# Patient Record
Sex: Female | Born: 1935 | Race: Black or African American | Hispanic: No | Smoking: Never smoker
Health system: Southern US, Community
[De-identification: ages and names within clinical notes are randomized; demographics above are authoritative.]

## PROBLEM LIST (undated history)

## (undated) DIAGNOSIS — E785 Hyperlipidemia, unspecified: Secondary | ICD-10-CM

## (undated) DIAGNOSIS — I509 Heart failure, unspecified: Secondary | ICD-10-CM

## (undated) DIAGNOSIS — Z950 Presence of cardiac pacemaker: Secondary | ICD-10-CM

## (undated) DIAGNOSIS — I442 Atrioventricular block, complete: Secondary | ICD-10-CM

## (undated) DIAGNOSIS — E1129 Type 2 diabetes mellitus with other diabetic kidney complication: Secondary | ICD-10-CM

## (undated) DIAGNOSIS — J45909 Unspecified asthma, uncomplicated: Secondary | ICD-10-CM

## (undated) DIAGNOSIS — I5042 Chronic combined systolic (congestive) and diastolic (congestive) heart failure: Secondary | ICD-10-CM

## (undated) DIAGNOSIS — I739 Peripheral vascular disease, unspecified: Secondary | ICD-10-CM

## (undated) DIAGNOSIS — E669 Obesity, unspecified: Secondary | ICD-10-CM

## (undated) DIAGNOSIS — I10 Essential (primary) hypertension: Secondary | ICD-10-CM

## (undated) DIAGNOSIS — N039 Chronic nephritic syndrome with unspecified morphologic changes: Secondary | ICD-10-CM

## (undated) DIAGNOSIS — I2510 Atherosclerotic heart disease of native coronary artery without angina pectoris: Secondary | ICD-10-CM

## (undated) DIAGNOSIS — I639 Cerebral infarction, unspecified: Secondary | ICD-10-CM

## (undated) DIAGNOSIS — I252 Old myocardial infarction: Secondary | ICD-10-CM

## (undated) DIAGNOSIS — Z9289 Personal history of other medical treatment: Secondary | ICD-10-CM

## (undated) DIAGNOSIS — N185 Chronic kidney disease, stage 5: Secondary | ICD-10-CM

## (undated) DIAGNOSIS — I6529 Occlusion and stenosis of unspecified carotid artery: Secondary | ICD-10-CM

## (undated) DIAGNOSIS — D631 Anemia in chronic kidney disease: Secondary | ICD-10-CM

## (undated) HISTORY — DX: Atherosclerotic heart disease of native coronary artery without angina pectoris: I25.10

## (undated) HISTORY — DX: Occlusion and stenosis of unspecified carotid artery: I65.29

## (undated) HISTORY — PX: INSERT / REPLACE / REMOVE PACEMAKER: SUR710

## (undated) HISTORY — DX: Peripheral vascular disease, unspecified: I73.9

## (undated) HISTORY — DX: Hyperlipidemia, unspecified: E78.5

## (undated) HISTORY — DX: Personal history of other medical treatment: Z92.89

---

## 1993-12-31 HISTORY — PX: CARDIAC CATHETERIZATION: SHX172

## 1998-12-31 HISTORY — PX: OTHER SURGICAL HISTORY: SHX169

## 2001-04-17 ENCOUNTER — Ambulatory Visit (HOSPITAL_COMMUNITY): Admission: RE | Admit: 2001-04-17 | Discharge: 2001-04-17 | Payer: Self-pay | Admitting: Gastroenterology

## 2004-07-25 ENCOUNTER — Ambulatory Visit (HOSPITAL_COMMUNITY): Admission: RE | Admit: 2004-07-25 | Discharge: 2004-07-26 | Payer: Self-pay | Admitting: Ophthalmology

## 2004-12-06 ENCOUNTER — Ambulatory Visit (HOSPITAL_COMMUNITY): Admission: RE | Admit: 2004-12-06 | Discharge: 2004-12-06 | Payer: Self-pay | Admitting: Interventional Cardiology

## 2005-05-01 ENCOUNTER — Encounter (HOSPITAL_COMMUNITY): Admission: RE | Admit: 2005-05-01 | Discharge: 2005-07-30 | Payer: Self-pay | Admitting: Nephrology

## 2005-05-01 ENCOUNTER — Encounter: Admission: RE | Admit: 2005-05-01 | Discharge: 2005-05-01 | Payer: Self-pay | Admitting: Nephrology

## 2005-05-03 ENCOUNTER — Inpatient Hospital Stay (HOSPITAL_COMMUNITY): Admission: EM | Admit: 2005-05-03 | Discharge: 2005-05-05 | Payer: Self-pay | Admitting: Emergency Medicine

## 2005-05-03 ENCOUNTER — Encounter (INDEPENDENT_AMBULATORY_CARE_PROVIDER_SITE_OTHER): Payer: Self-pay | Admitting: Cardiology

## 2005-10-05 ENCOUNTER — Encounter (HOSPITAL_COMMUNITY): Admission: RE | Admit: 2005-10-05 | Discharge: 2006-01-03 | Payer: Self-pay | Admitting: Nephrology

## 2006-01-04 ENCOUNTER — Encounter (HOSPITAL_COMMUNITY): Admission: RE | Admit: 2006-01-04 | Discharge: 2006-04-04 | Payer: Self-pay

## 2006-05-23 ENCOUNTER — Encounter (HOSPITAL_COMMUNITY): Admission: RE | Admit: 2006-05-23 | Discharge: 2006-08-21 | Payer: Self-pay | Admitting: Nephrology

## 2006-09-13 ENCOUNTER — Encounter (HOSPITAL_COMMUNITY): Admission: RE | Admit: 2006-09-13 | Discharge: 2006-12-12 | Payer: Self-pay | Admitting: Orthopedic Surgery

## 2006-12-19 ENCOUNTER — Encounter (HOSPITAL_COMMUNITY): Admission: RE | Admit: 2006-12-19 | Discharge: 2007-03-11 | Payer: Self-pay | Admitting: Nephrology

## 2007-05-02 ENCOUNTER — Encounter (HOSPITAL_COMMUNITY): Admission: RE | Admit: 2007-05-02 | Discharge: 2007-06-30 | Payer: Self-pay | Admitting: Nephrology

## 2007-08-08 ENCOUNTER — Encounter (HOSPITAL_COMMUNITY): Admission: RE | Admit: 2007-08-08 | Discharge: 2007-10-06 | Payer: Self-pay | Admitting: Nephrology

## 2007-09-16 ENCOUNTER — Encounter: Payer: Self-pay | Admitting: Emergency Medicine

## 2007-11-03 ENCOUNTER — Encounter: Payer: Self-pay | Admitting: Emergency Medicine

## 2007-11-21 ENCOUNTER — Encounter (HOSPITAL_COMMUNITY): Admission: RE | Admit: 2007-11-21 | Discharge: 2008-02-19 | Payer: Self-pay | Admitting: Nephrology

## 2007-12-23 ENCOUNTER — Encounter: Payer: Self-pay | Admitting: Emergency Medicine

## 2007-12-30 ENCOUNTER — Encounter: Payer: Self-pay | Admitting: Emergency Medicine

## 2008-01-02 ENCOUNTER — Ambulatory Visit (HOSPITAL_COMMUNITY): Admission: RE | Admit: 2008-01-02 | Discharge: 2008-01-02 | Payer: Self-pay | Admitting: Nephrology

## 2008-01-12 ENCOUNTER — Ambulatory Visit: Payer: Self-pay | Admitting: Emergency Medicine

## 2008-01-12 DIAGNOSIS — E785 Hyperlipidemia, unspecified: Secondary | ICD-10-CM | POA: Insufficient documentation

## 2008-01-12 DIAGNOSIS — Z9861 Coronary angioplasty status: Secondary | ICD-10-CM

## 2008-01-12 DIAGNOSIS — I2510 Atherosclerotic heart disease of native coronary artery without angina pectoris: Secondary | ICD-10-CM

## 2008-01-13 DIAGNOSIS — I119 Hypertensive heart disease without heart failure: Secondary | ICD-10-CM

## 2008-01-28 ENCOUNTER — Encounter: Payer: Self-pay | Admitting: Emergency Medicine

## 2008-01-28 DIAGNOSIS — J45909 Unspecified asthma, uncomplicated: Secondary | ICD-10-CM

## 2008-01-28 HISTORY — DX: Unspecified asthma, uncomplicated: J45.909

## 2008-02-20 ENCOUNTER — Encounter (HOSPITAL_COMMUNITY): Admission: RE | Admit: 2008-02-20 | Discharge: 2008-05-20 | Payer: Self-pay | Admitting: Nephrology

## 2008-05-21 ENCOUNTER — Encounter (HOSPITAL_COMMUNITY): Admission: RE | Admit: 2008-05-21 | Discharge: 2008-08-19 | Payer: Self-pay | Admitting: Nephrology

## 2008-08-25 ENCOUNTER — Ambulatory Visit: Payer: Self-pay | Admitting: Hematology and Oncology

## 2008-09-03 LAB — CBC WITH DIFFERENTIAL/PLATELET
Basophils Absolute: 0 10*3/uL (ref 0.0–0.1)
HCT: 34.3 % — ABNORMAL LOW (ref 34.8–46.6)
LYMPH%: 28.2 % (ref 14.0–48.0)
MCH: 30.9 pg (ref 26.0–34.0)
MCHC: 33 g/dL (ref 32.0–36.0)
Platelets: 146 10*3/uL (ref 145–400)
RBC: 3.67 10*6/uL — ABNORMAL LOW (ref 3.70–5.32)
RDW: 13.2 % (ref 11.3–14.5)

## 2008-09-10 LAB — LACTATE DEHYDROGENASE: LDH: 275 U/L — ABNORMAL HIGH (ref 94–250)

## 2008-09-10 LAB — COMPREHENSIVE METABOLIC PANEL WITH GFR
ALT: 21 U/L (ref 0–35)
AST: 21 U/L (ref 0–37)
Albumin: 4.1 g/dL (ref 3.5–5.2)
Alkaline Phosphatase: 83 U/L (ref 39–117)
BUN: 32 mg/dL — ABNORMAL HIGH (ref 6–23)
CO2: 25 meq/L (ref 19–32)
Calcium: 9.5 mg/dL (ref 8.4–10.5)
Chloride: 107 meq/L (ref 96–112)
Creatinine, Ser: 1.88 mg/dL — ABNORMAL HIGH (ref 0.40–1.20)
Glucose, Bld: 121 mg/dL — ABNORMAL HIGH (ref 70–99)
Potassium: 5 meq/L (ref 3.5–5.3)
Sodium: 141 meq/L (ref 135–145)
Total Bilirubin: 0.5 mg/dL (ref 0.3–1.2)
Total Protein: 6.9 g/dL (ref 6.0–8.3)

## 2008-09-10 LAB — HEMOCHROMATOSIS DNA-PCR(C282Y,H63D)

## 2008-09-10 LAB — IRON AND TIBC
%SAT: 62 % — ABNORMAL HIGH (ref 20–55)
Iron: 157 ug/dL — ABNORMAL HIGH (ref 42–145)
TIBC: 255 ug/dL (ref 250–470)
UIBC: 98 ug/dL

## 2009-07-26 ENCOUNTER — Encounter (HOSPITAL_COMMUNITY): Admission: RE | Admit: 2009-07-26 | Discharge: 2009-10-24 | Payer: Self-pay | Admitting: Nephrology

## 2009-11-04 ENCOUNTER — Encounter (HOSPITAL_COMMUNITY): Admission: RE | Admit: 2009-11-04 | Discharge: 2010-02-02 | Payer: Self-pay | Admitting: Nephrology

## 2010-02-17 ENCOUNTER — Encounter (HOSPITAL_COMMUNITY): Admission: RE | Admit: 2010-02-17 | Discharge: 2010-05-18 | Payer: Self-pay | Admitting: Nephrology

## 2010-05-30 ENCOUNTER — Encounter (HOSPITAL_COMMUNITY): Admission: RE | Admit: 2010-05-30 | Discharge: 2010-06-29 | Payer: Self-pay | Admitting: Nephrology

## 2010-07-25 ENCOUNTER — Encounter (HOSPITAL_COMMUNITY): Admission: RE | Admit: 2010-07-25 | Discharge: 2010-10-23 | Payer: Self-pay | Admitting: Nephrology

## 2010-11-14 ENCOUNTER — Encounter (HOSPITAL_COMMUNITY)
Admission: RE | Admit: 2010-11-14 | Discharge: 2011-01-30 | Payer: Self-pay | Source: Home / Self Care | Attending: Nephrology | Admitting: Nephrology

## 2011-01-21 ENCOUNTER — Encounter: Payer: Self-pay | Admitting: Hematology and Oncology

## 2011-02-06 ENCOUNTER — Encounter (HOSPITAL_COMMUNITY): Payer: Medicare Other | Attending: Nephrology

## 2011-02-06 DIAGNOSIS — N183 Chronic kidney disease, stage 3 unspecified: Secondary | ICD-10-CM | POA: Insufficient documentation

## 2011-02-06 DIAGNOSIS — D638 Anemia in other chronic diseases classified elsewhere: Secondary | ICD-10-CM | POA: Insufficient documentation

## 2011-03-02 ENCOUNTER — Encounter (HOSPITAL_COMMUNITY): Payer: BC Managed Care – PPO

## 2011-03-06 ENCOUNTER — Encounter (HOSPITAL_COMMUNITY): Payer: Medicare Other

## 2011-04-02 ENCOUNTER — Encounter (HOSPITAL_COMMUNITY)
Admission: RE | Admit: 2011-04-02 | Discharge: 2011-04-02 | Disposition: A | Discharge status: Home / Self Care | Payer: Medicare Other | Source: Ambulatory Visit | Attending: Nephrology | Admitting: Nephrology

## 2011-04-02 DIAGNOSIS — N183 Chronic kidney disease, stage 3 unspecified: Secondary | ICD-10-CM | POA: Insufficient documentation

## 2011-04-02 DIAGNOSIS — D638 Anemia in other chronic diseases classified elsewhere: Secondary | ICD-10-CM | POA: Insufficient documentation

## 2011-04-03 ENCOUNTER — Encounter (HOSPITAL_COMMUNITY): Payer: BC Managed Care – PPO

## 2011-05-01 ENCOUNTER — Encounter (HOSPITAL_COMMUNITY): Payer: Medicare Other | Attending: Nephrology

## 2011-05-01 DIAGNOSIS — D638 Anemia in other chronic diseases classified elsewhere: Secondary | ICD-10-CM | POA: Insufficient documentation

## 2011-05-01 DIAGNOSIS — N183 Chronic kidney disease, stage 3 unspecified: Secondary | ICD-10-CM | POA: Insufficient documentation

## 2011-05-18 NOTE — Cardiovascular Report (Signed)
NAMELEONORE, FRANKSON NO.:  [id number]   MEDICAL RECORD NO.:  [id number]          PATIENT TYPE:  OIB   LOCATION:  2899                         FACILITY:  [facility]   PHYSICIAN:  Lyn Records III, M.D.DATE OF BIRTH:  [birth year]   DATE OF PROCEDURE:  12/06/2004  DATE OF DISCHARGE:  12/06/2004                              CARDIAC CATHETERIZATION   INDICATIONS FOR PROCEDURE:  Abnormal Cardiolite, prior history of myocardial  infarction, prior history of diagonal angioplasty after myocardial  infarction.  Cardiolite demonstrates a large area of anterolateral and  lateral wall ischemia, no apical ischemia.   PROCEDURES PERFORMED:  1.  Left heart catheterization.  2.  Coronary angiography.  3.  Left ventriculography.  4.  Angio-Seal arteriotomy closure.  5.  Intravenous labetalol.  6.  Intracoronary nitroglycerin.   DESCRIPTION OF PROCEDURE:  After informed consent, a 6-French sheath was  placed using the modified Seldinger technique.  A 6-French A-2 multipurpose  catheter was used for hemodynamic recordings, left ventriculography by hand  injection, and selective right and left coronary angiography.  Following the  procedure, a sheathogram was performed on the right femoral and Angio-Seal  arteriotomy closure performed without complications.  Intravenous labetalol  20 mg was administered prior to contrast injection because of elevated blood  pressure.  Intracoronary nitroglycerin 200 mcg was administered during left  coronary angiography.   RESULTS:  1.  Hemodynamic data.      1.  Aortic pressure 153/60.      2.  Left ventricular pressure 153/12.  2.  Left ventriculography: The left ventricle was normal in size.  There was      mild mid inferior wall hypokinesis.  Overall, EF was 60%, no MR.  3.  Coronary angiography.      1.  Left main coronary: Left main coronary artery for the most part is          widely patent.  Minimal calcification is noted.  No  obstruction is          seen.      2.  Left anterior descending coronary: The LAD is a large vessel that          wraps around the left ventricular apex and supplies a portion of the          inferior wall.  The LAD for the most part is widely patent through          the proximal half of the vessel.  There is a proximal 50% narrowing          just after the first septal perforator and just distal to the origin          of the first diagonal.  The first diagonal was a previous infarct          vessel and contains 80% ostial narrowing as well as 95% narrowing in          the mid vessel inside a previous angioplasty.  In the mid LAD, there          are 50-60% lesions and  in the distal LAD at the apex, there was 90%          focal stenosis.      3.  Circumflex artery: The circumflex coronary artery is a dominant          vessel that gives origin to the PDA.  It also gives origin to a          large second obtuse marginal.  The first obtuse marginal is totally          occluded in the proximal vessel.  It fills late by left to left          collaterals.  The collaterals to this obtuse marginal are from the          second obtuse marginal and the PDA.      4.  Right coronary artery: The right coronary artery is nondominant.          There is calcium in the ostium and it bifurcates into the AV groove          continuation of the nondominant right coronary and a conus branch          both limbs of which are 75% narrowed.  There is no inferior wall          branch that arises from this small vessel.  4.  Angio-Seal arteriotomy closure: Successful with good hemostasis and no      complications.   CONCLUSION:  1.  Significant coronary atherosclerosis with 99% mid diagonal #1, 80%      ostial diagonal #1, and total occlusion of the first obtuse marginal.      The first obtuse marginal fills by left to left collaterals from the      distal circumflex.  The LAD contains moderate proximal mid disease  and      severe 90% plus stenosis at the apex.  LAD is dominant and wraps the      apex.  The right coronary artery was nondominant and also contains      significant obstruction.  2.  Overall normal LV function with inferior wall hypokinesis.   PLAN:  Aggressive risk factor modification and given the patient's renal  function and the location of the obstruction in the first diagonal in the  absence of anginal pain, I believe continued medical therapy is the  treatment of choice.       HWS/MEDQ  D:  12/06/2004  T:  12/07/2004  Job:  [id number]

## 2011-05-18 NOTE — Discharge Summary (Signed)
NAMESWEET, Dorothy Bartlett                ACCOUNT NO.:  [id number]   MEDICAL RECORD NO.:  [id number]          PATIENT TYPE:  INP   LOCATION:  3731                         FACILITY:  [facility]   PHYSICIAN:  Elliot Cousin, M.D.    DATE OF BIRTH:  1926/08/13   DATE OF ADMISSION:  05/03/2005  DATE OF DISCHARGE:  05/05/2005                                 DISCHARGE SUMMARY   DISCHARGE DIAGNOSES:  1.  Hyperkalemia secondary to chronic kidney disease and ACE inhibitor and      ARB.  2.  Altered mental status thought to be secondary to hyperkalemia and      possibly hypoglycemia.  3.  Stage IV chronic kidney disease with secondary hypoparathyroidism.      Baseline BUN appears to range between 40 and 45,  and baseline      creatinine appears to range between 1.9 and 2.2.  4.  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  5.  Right carotid artery stenosis.  6.  Left ventricular hypertrophy and mild aortic stenosis per 2-D      echocardiogram of May4,2006.  7.  Coronary artery disease status post myocardial infarction and      angioplasties in the past.  8.  Chronic normocytic anemia thought to be secondary to chronic kidney      disease.  9.  Hyperlipidemia.  10. Colon polyps, status post polypectomy approximately three years ago.  11. Diabetic retinopathy, status post photocoagulation and lens implant in      left eye in July of2005.   DISCHARGE MEDICATIONS:  1.  Do not take enalapril.  2.  Diovan 160 mg 1 pill daily, restart on Monday, EAV4,0981.  3.  Zyrtec 10 mg daily.  4.  Glipizide 10 mg1 pill daily (do not take if your blood sugar is less      than 100).  5.  Actos 30 mg daily.  6.  Lasix 40 mg daily.  7.  Lopressor 50 mg b.i.d.  8.  Zemplar 1 mcg daily.  9.  Vytorin 10/80 1 pill daily.  10. Aspirin 81 mg daily.  11. Nephro-Vite tablet 2 daily.  12. Ferrous sulfate 325 mg 1 tablet twice daily.   DISCHARGE DISPOSITION:  The patient was discharged home in improved and  stable condition on May6,2006. The patient will follow up with Dr. Hyman Hopes on  [id number] for blood work. The patient was advised to follow up with Dr.  Ivory Broad in 7-10 days.   CONSULTATIONS:  Dr. Briant Cedar, nephrologist.   PROCEDURES PERFORMED:  1.  Bilateral carotid Doppler study on May4,2006. The results revealed on      the right, 60-80 mid-range ICA stenosis. ECA stenosis. On the left, no      evidence of significant ICA stenosis. The bilateral vertebral flow is      antegrade. Technically difficult study secondary to body habitus.  2.  A 2-D echocardiogram on May4, 2006 as read by Dr. Donnie Aho.  The results      revealed left ventricular ejection fraction estimated at 55-65%. No left      ventricular  regional wall motion abnormalities. Left ventricular wall      thickness was moderately increased. Aortic valve thickness was      moderately increased. Findings were consistent with very mild aortic      valve stenosis. The mean transaortic valve gradient was 9 mmHg.      Estimated aortic valve area was 2.04 cm squared. Estimated aortic valve      area by Z-Max was 2.1 cm squared. Left atrium was moderately dilated.   HISTORY OF PRESENT ILLNESS:  The patient is a 77 year old lady with a past  medical history significant for coronary artery disease, type 2 diabetes  mellitus, and known right carotid artery stenosis, who presented to the  emergency department on [id number],6644 with the chief complaint of altered level  of consciousness. Apparently the patient had been sleeping on her couch.  When she woke up, she could not move. She was apparently diaphoretic and  could not speak. The patient also had difficulty seeing, and her speech was  apparently garbled. When the patient arrived to the emergency department,  her potassium was found to be elevated at 6.9. She was treated with calcium  chloride, sodium bicarbonate, and Kayexalate. The patient was admitted for  further evaluation and  management.   For additional details please see the dictated history and physical.   HOSPITAL COURSE:  Problem 1.  HYPERKALEMIA/ALTERED MENTAL STATUS.  As stated  above, the patient was started on treatment in the emergency department for  hyperkalemia. The patient was subsequently admitted to a telemetry bed and  started on treatment with Kayexalate and half normal saline. The patient's  serum potassium was followed closely during the hospital course. A CT scan  of the head was obtained in the emergency department and was negative for  any acute intracranial abnormalities. There was evidence of chronic  microvascular ischemic change. Following several more doses of Kayexalate,  the patient's serum potassium improved to 5.5. A decision was made to add  bicarbonate to the IV fluids. Following treatment with bicarbonate, the  patient's potassium improved to 4.9. Following the treatment, during the  first few hours of hospitalization, the patient's mental status improved  tremendously. It was felt that the patient may have had a TIA on admission  and, therefore, carotid Dopplers were ordered to rule out stenosis. The  carotid Doppler study did reveal a 60-80% mid-range ICA stenosis. The  patient states that she has known about the stenosis for at least one year  now. The patient has seen a vascular surgeon in [facility], and he has  decided to treat the patient medically with aspirin. Given that the  patient's symptoms resolved with correction of the hyperkalemia,  it was  felt that the patient's altered level of consciousness was secondary to the  hyperkalemia and not the right carotid artery stenosis. For additional  evaluation, a 2-D echocardiogram was ordered. The 2-D echocardiogram  revealed that the patient had preserved left ventricular systolic function.  Her ejection fraction ranged between 55 and 65%. She did have evidence of very mild aortic stenosis. The very mild stenosis was  felt to be a non-  contributing factor to the patient's presenting symptoms.   Dr. Briant Cedar, nephrologist, was consulted given the patient's chronic  kidney disease. Dr. Briant Cedar agreed with the medical management which  included Kayexalate, bicarbonate in the IV fluids, and holding the Diovan  and enalapril. He did recommend restarting the Diovan in three days for  blood pressure control with  close followup by Dr. Hyman Hopes in one week. The  enalapril will be on hold for now. The patient's capillary blood sugars were  relatively hypoglycemic at home per the patient and the patient's daughter.  Her capillary blood sugars were in the 60s during the day of the patient's  presenting symptoms. It is not clear if the patient also had a hypoglycemic  episode while at home. The patient's capillary blood sugar on admission in  the emergency department was 93. During the three-day hospital course, the  patient's mental status was back to baseline. On exam, the patient had no  focal neurological deficits. She was ambulating in the room and in the  hallway with no abnormalities. The patient will follow up with Dr. Hyman Hopes on  [id number].  At that time, a repeat basic metabolic panel will be assessed  particularly to recheck the patient's serum potassium.   Problem 2.  STAGE IV CHRONIC KIDNEY DISEASE.  The patient is followed by a  nephrologist, Garnetta Buddy, M.D., for outpatient management of her chronic  kidney disease. The patient was recently started on Zemplar for secondary  hypoparathyroidism. The patient was also recently started on Nephro-Vite and  Aranesp. It appears that her baseline BUN ranges between 40 and 45, and her  baseline creatinine appears to range between 1.9 and 2.2.   Problem 3.  TYPE 2 DIABETES MELLITUS WITH A HISTORY OF RETINOPATHY AND  POSSIBLE HYPOGLYCEMIA.  The patient's glipizide was decreased to 10 mg  daily, and the Actos was continued at 30 mg daily. The patient's  capillary  blood sugars were assessed before each meal and at bedtime. During the  hospital course, the patient's capillary blood sugars ranged between 75 and  150. However, on the day of hospital discharge, her capillary blood sugar  did increase to 399. The patient was given 7 units of NovoLog x1.  The  patient was advised to continue 10 mg of glipizide daily but to hold  glipizide if her capillary blood sugars were less than 100. If she finds  that her capillary blood sugars are significantly increased, then she can  restart glipizide 10 mg b.i.d.. The patient was advised to continue Actos as  previously prescribed.   Problem 4.  NORMOCYTIC ANEMIA.  The patient's hemoglobin on admission was  9.6 with a MCV of 92.7. During the hospital course, the patient's hemoglobin  fluctuated between 9.6 and 8.6. The patient was started on Aranesp by Dr.  Hyman Hopes several weeks ago. She was taking an over-the-counter iron supplement as well. However, during the hospital course, she was started on ferrous  sulfate 325 mg twice daily. Iron studies ordered during the hospital course  revealed a total iron of 69, TIBC of 292, percent saturation of 24, vitamin  B12 of 483, and folic acid of greater than 20. Further management and follow  up per Dr. Hyman Hopes.   DISCHARGE LABORATORY DATA:  WBC 9.7, hemoglobin 9, hematocrit 27.1,  platelets 237,000.  Sodium 142, potassium 4.7, chloride 108, CO2 26, glucose  66, BUN 40, creatinine 2, albumin 3.2, calcium 8.7, phosphorus 3.8.      DF/MEDQ  D:  05/05/2005  T:  05/06/2005  Job:  [id number]   cc:   Garnetta Buddy, M.D.  [address]  [place]  [place] [address]  Fax: [phone]   Lyn Records III, M.D.  [address]  [address]  [place]  [place] [address]  Fax: [phone]   Reuben Likes, M.D.  [place], [place]

## 2011-05-18 NOTE — H&P (Signed)
NAME:  Dorothy Bartlett, Dorothy Bartlett                ACCOUNT NO.:  [id number]   MEDICAL RECORD NO.:  [id number]          PATIENT TYPE:  EMS   LOCATION:  MAJO                         FACILITY:  [facility]   PHYSICIAN:  Jonna L. Robb Matar, M.D.DATE OF BIRTH:  1926/07/15   DATE OF ADMISSION:  05/03/2005  DATE OF DISCHARGE:                                HISTORY & PHYSICAL   PRIMARY CARE PHYSICIAN:  Unassigned, so Dr. Nolon Nations in [place].   NEPHROLOGIST:  Garnetta Buddy, M.D.   CARDIOLOGIST:  Lyn Records, M.D.   CHIEF COMPLAINT:  Fall and weakness.   HISTORY:  This 77 year old, African-American female was sleeping on the  couch.  When she went to wake up, she could not move.  She was diaphoretic.  She could not speak.  She could not see.  She had difficulty talking in her  garbled speech.  She fell off the couch and eventually managed to get to the  phone and call her family.  They estimate it was about 20 minutes after they  got there that she finally seemed to come around to herself.  She was  brought to the emergency room, where she was found to have a potassium of  6.9, was treated with calcium chloride, sodium bicarbonate, and one dose of  Kayexalate at about 5:00 a.m.   PAST MEDICAL HISTORY:  1.  Type 2 diabetes.  She has had this for about 10 years.  Sugars have been      pretty well controlled.  She did not think that her episode was a low      blood sugar reaction.  2.  Coronary artery disease.  She has had one MI, two angioplasties.  Her      most recent catheterization was this past December, where she was still      found to have only one vessel totally occluded, and marginal was 95%,      but they felt at that time that medical management was going to be the      treatment.  3.  Hypertension.  4.  She had pneumonia at one time.  5.  Diabetic retinopathy, and had photocoagulation and lens implant in her      left eye in July 2005.  6.  Chronic renal disease.  It was only  diagnosed officially two weeks ago.      She saw Dr. Hyman Hopes and got started on Aranesp, Zemplar, and Rena-Vite.      However, Dr. Katrinka Blazing mentioned that he thought she was having some      problems with her kidneys.  This may have actually been going on for      about a year or two.  7.  Hyperlipidemia.  8.  Colon polyps.  This was diagnosed two years ago.  She had them removed.   ALLERGIES:  Big doses of ASPIRIN give her dyspepsia, but she can tolerate  the 81 mg size.   MEDICATIONS:  1.  Diovan 160.  2.  Enalapril 30.  3.  Zyrtec 10.  4.  Glipizide 10 b.i.d.  5.  Actos 30.  6.  Lasix 40 daily.  7.  Lopressor 50 b.i.d.  8.  Zemplar 1 mcg daily.  9.  Rena-Vite.  10. Feosol.  11. Vytorin 10/80 daily.  12. Aspirin 81.   OPERATIONS:  1.  Left cataract.  2.  Left arm nerve surgery.   SOCIAL HISTORY:  Nonsmoker, nondrinker.  No drugs.  She is widowed.  Has  three daughters, two sons.  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   FAMILY HISTORY:  Hypertension, diabetes, MI.   REVIEW OF SYSTEMS:  The patient denies any fever or chills or excessive  weight gain.  She does have a nonproductive cough for the last month.  No  wheezing or history of COPD.  She has not been told of heart failure,  although she does have problems with fluid.  No history of blood clots.  No  nausea or vomiting, diarrhea, constipation, hematemesis, melena, or liver  problems.  No hematuria or renal calculi.  She does not have problems with  continence.  The patient has nocturia x 1.  No breast masses.  Her last  mammogram she cannot remember.  We discussed with her daughter having her  mother go for mammography.  No history of thyroid or B-12 problems.  She had  a pruritic rash on her  skin that was causing difficulty but cleared up with  Zyrtec and some creams.  Occasional mild arthritis in her back, but she does  not take anything over the counter for it.   No previous history of seizure,  CVA, TIA.  No anxiety or depression.   PHYSICAL EXAMINATION:  VITAL SIGNS:  Temperature 97.1; pulse 67;  respirations 18; blood pressure 195/67; 96%.  GENERAL:  Well-developed, morbidly obese African-American female, quite  pleasant.  HEENT:  Normal conjunctivae and lids.  Pupils reactive.  Extraocular  movements are full.  Normal hearing, mucosa, pharynx.  NECK:  Supple, without masses or thyromegaly.  She has a left carotid bruit  and possibly a right carotid bruit as well, although it is hard to tell on  the right if it is a transmitted heart sound.  VASCULAR:  I do not hear femoral bruits.  She has no cyanosis or clubbing.  She has 2+ edema to the midcalf.  BREASTS:  Enlarged, without masses, tenderness, or discharge.  ABDOMEN:  Nontender.  Normal bowel sounds.  No hepatosplenomegaly or  hernias.  Normal external genitalia.  LYMPHATIC:  No cervical or inguinal adenopathy.  EXTREMITIES:  Muscle strength is 5/5, with full range of motion in all four  extremities.  SKIN:  There are no skin rashes, lesions, or nodules.  NEUROLOGIC:  Cranial nerves are intact.  DTRs are 1+.  Sensation seems to be  intact.  She is alert and oriented.  Normal memory, judgment, and affect.   LABORATORY DATA:  CT of the head shows chronic microvascular white matter  disease.  Renal ultrasound done two days go just showed chronic renal  medical disease, no hydronephrosis.  EKG shows poor R wave progression and a  peaked T wave.  Urinalysis was negative.  INR 0.9.  BUN and creatinine were  49 and 1.9.  Glucose 93, calcium 9.1.  Normal liver function tests.  Initial  potassium was 6.9, repeat down to 6.1.  White count 10.8, with 80%  neutrophils, hemoglobin 9.6.   IMPRESSION:  1.  Transient ischemic attack.  This may  have been brought on by      hypoglycemia or the potassium issues.  I am going to check an ultrasound     of her carotids and start her on Plavix.  2.   Hyperkalemia.  Most likely, this is due to her worsening renal function,      and both Diovan and Vasotec on board.  Obviously, those will have to be      held.  3.  Chronic renal failure.  She will continue on her Aranesp, Zemplar, Rena-      Vite.  4.  Type 2 diabetes.  This is well controlled, which makes me wonder if she      was actually hypoglycemic, since over time she probably is not excreting      the glipizide and Actos as well.  If she has significant elements of      congestive heart failure on the echocardiogram, we may consider getting      rid of the Actos, since that can contribute to CHF.  5.  Anemia of chronic disease.  I will be checking an anemia workup, and she      has been started on erythropoietin.  6.  Coronary artery disease.  7.  Hypertension.  She is having some problems with that.  We may need to      put her on non-ACE or ARB medications, considering Cardizem or      clonidine.  If she has heart failure, we may consider going for      something like hydralazine and nitroglycerin product.  8.  Hyperlipidemia.  She will continue on her Vytorin.      JLB/MEDQ  D:  05/03/2005  T:  05/03/2005  Job:  [id number]

## 2011-05-18 NOTE — Op Note (Signed)
NAME:  Dorothy Bartlett, DIVELEY                          ACCOUNT NO.:  [id number]   MEDICAL RECORD NO.:  [id number]                   PATIENT TYPE:  OIB   LOCATION:  2899                                 FACILITY:  [facility]   PHYSICIAN:  Beulah Gandy. Ashley Royalty, M.D.              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   PROCEDURES:  1. Panretinal photocoagulation left eye.  2. Placement of secondary intraocular lens left eye.  3. Intraocular lens suture left eye.  4. Pars plana vitrectomy left eye.  5. Iridectomy left eye.   SURGEON:  Beulah Gandy. Ashley Royalty, M.D.   ASSISTANT:  Lawson Fiscal _______, RN.   ANESTHESIA:  General anesthesia.   DESCRIPTION OF PROCEDURE:  Usual prep and drape.  The indirect  ophthalmoscope laser was moved into place, 1072 burns were placed around  the retinal periphery with a power of 200 milliwatts, 1000 microns each and  0.1 seconds each.  A full panretinal photocoagulation was performed sparing  the macular region.  The attention was then carried to the limbus where a  peritomy was performed from 8 o'clock to 4 o'clock.  A 4 mm infusion port  anchored into place at 4 o'clock.  The lighted pick and the cutter were  placed at 10 and 2 o'clock.  Pars plana vitrectomy was begun using the BIOM  viewing system.  The posterior vitreous was carefully removed and trimmed  down to the vitreous base.  An iridectomy was performed to remove adhesion  from the iris to the capsule at 10 o'clock.  The cornea was opened with a  three-layer wound.  Two 10-0 Prolene sutures were passed from 3 o'clock to 9  o'clock beneath raised scleral flaps in anticipation of the intraocular lens  placement.  The sutures were externalized through the corneal wound,  attached to an intraocular lens implant.  The implant was model [id number],  power  22.5D, links 12.5 mm, optic 7.0 mm, serial number [id number], made by  [employer].  The implant was placed on the operative field,  inspected and cleaned.  Sutures were attached to the optics.  The implant  was placed in the posterior sulcus and dialed into place.  The sutures were  drawn securely, knotted and the free ends removed beneath the scleral flaps.  Additional vitrectomy was performed at this point. The cornea wound was  closed with four interrupted 10-0 nylon sutures.  The sclerotomy points were  closed at 2 o'clock and 4 o'clock.  The conjunctiva was closed with 7-0  chromic suture, polymixin and gentamicin were irrigated in the tenon space,  atropine solution was applied.  Decadron 10 mg was injected in the lower  subconjunctival space.  Marcaine was injected around the globe for  postoperative  pain.  Polysporin, a patch and shield were placed, closing  tension was 10 mmHg.   COMPLICATIONS:  None.   DURATION:  One hour.                                               Beulah Gandy. Ashley Royalty, M.D.    JDM/MEDQ  D:  07/25/2004  T:  07/25/2004  Job:  [id number]

## 2011-05-29 ENCOUNTER — Encounter (HOSPITAL_COMMUNITY): Payer: Medicare Other

## 2011-06-05 ENCOUNTER — Encounter (HOSPITAL_COMMUNITY): Payer: BC Managed Care – PPO

## 2011-06-26 ENCOUNTER — Encounter (HOSPITAL_COMMUNITY): Payer: BC Managed Care – PPO

## 2011-07-24 ENCOUNTER — Encounter (HOSPITAL_COMMUNITY): Payer: BC Managed Care – PPO

## 2011-08-09 ENCOUNTER — Encounter (HOSPITAL_COMMUNITY): Payer: BC Managed Care – PPO | Attending: Nephrology

## 2011-08-09 DIAGNOSIS — D638 Anemia in other chronic diseases classified elsewhere: Secondary | ICD-10-CM | POA: Insufficient documentation

## 2011-08-09 DIAGNOSIS — N183 Chronic kidney disease, stage 3 unspecified: Secondary | ICD-10-CM | POA: Insufficient documentation

## 2011-09-21 LAB — POCT HEMOGLOBIN-HEMACUE: Hemoglobin: 11.3 — ABNORMAL LOW

## 2011-09-24 LAB — POCT HEMOGLOBIN-HEMACUE
Hemoglobin: 11.9 — ABNORMAL LOW
Operator id: 206361

## 2011-10-05 ENCOUNTER — Encounter (HOSPITAL_COMMUNITY)
Admission: RE | Admit: 2011-10-05 | Discharge: 2011-10-05 | Disposition: A | Discharge status: Home / Self Care | Payer: Medicare Other | Source: Ambulatory Visit | Attending: Nephrology | Admitting: Nephrology

## 2011-10-05 DIAGNOSIS — D638 Anemia in other chronic diseases classified elsewhere: Secondary | ICD-10-CM | POA: Insufficient documentation

## 2011-10-05 DIAGNOSIS — N183 Chronic kidney disease, stage 3 unspecified: Secondary | ICD-10-CM | POA: Insufficient documentation

## 2011-10-15 LAB — POCT I-STAT 4, (NA,K, GLUC, HGB,HCT)
HCT: 48 — ABNORMAL HIGH
Operator id: 274481

## 2011-11-02 ENCOUNTER — Encounter (HOSPITAL_COMMUNITY): Payer: Medicare Other

## 2011-11-02 DIAGNOSIS — D638 Anemia in other chronic diseases classified elsewhere: Secondary | ICD-10-CM | POA: Insufficient documentation

## 2011-11-02 DIAGNOSIS — N183 Chronic kidney disease, stage 3 unspecified: Secondary | ICD-10-CM | POA: Insufficient documentation

## 2011-11-12 ENCOUNTER — Other Ambulatory Visit (HOSPITAL_COMMUNITY): Payer: Self-pay | Admitting: *Deleted

## 2011-11-13 ENCOUNTER — Encounter (HOSPITAL_COMMUNITY)
Admission: RE | Admit: 2011-11-13 | Discharge: 2011-11-13 | Disposition: A | Discharge status: Home / Self Care | Payer: Medicare Other | Source: Ambulatory Visit | Attending: Nephrology | Admitting: Nephrology

## 2011-11-13 MED ORDER — DARBEPOETIN ALFA-POLYSORBATE 500 MCG/ML IJ SOLN
40.0000 ug | INTRAMUSCULAR | Status: DC
  Filled 2011-11-13: qty 1

## 2011-11-13 MED ORDER — DARBEPOETIN ALFA-POLYSORBATE 40 MCG/0.4ML IJ SOLN
INTRAMUSCULAR | Status: AC
  Administered 2011-11-13: 40 ug via SUBCUTANEOUS
  Filled 2011-11-13: qty 0.4

## 2011-12-07 ENCOUNTER — Encounter (HOSPITAL_COMMUNITY)
Admission: RE | Admit: 2011-12-07 | Discharge: 2011-12-07 | Disposition: A | Discharge status: Home / Self Care | Payer: Medicare Other | Source: Ambulatory Visit | Attending: Nephrology | Admitting: Nephrology

## 2011-12-07 DIAGNOSIS — D638 Anemia in other chronic diseases classified elsewhere: Secondary | ICD-10-CM | POA: Insufficient documentation

## 2011-12-07 DIAGNOSIS — N183 Chronic kidney disease, stage 3 unspecified: Secondary | ICD-10-CM | POA: Insufficient documentation

## 2011-12-07 MED ORDER — DARBEPOETIN ALFA-POLYSORBATE 500 MCG/ML IJ SOLN
40.0000 ug | INTRAMUSCULAR | Status: DC
  Filled 2011-12-07: qty 1

## 2011-12-07 MED ORDER — DARBEPOETIN ALFA-POLYSORBATE 40 MCG/0.4ML IJ SOLN
INTRAMUSCULAR | Status: AC
  Administered 2011-12-07: 40 ug via SUBCUTANEOUS
  Filled 2011-12-07: qty 0.4

## 2011-12-07 MED ORDER — CLONIDINE HCL 0.1 MG PO TABS
0.1000 mg | ORAL_TABLET | Freq: Once | ORAL | Status: AC | PRN
  Administered 2011-12-07: 0.1 mg via ORAL

## 2011-12-07 MED ORDER — CLONIDINE HCL 0.1 MG PO TABS
ORAL_TABLET | ORAL | Status: AC
  Administered 2011-12-07: 0.1 mg via ORAL
  Filled 2011-12-07: qty 1

## 2012-01-04 ENCOUNTER — Inpatient Hospital Stay (HOSPITAL_COMMUNITY): Admission: RE | Admit: 2012-01-04 | Payer: BC Managed Care – PPO | Source: Ambulatory Visit

## 2012-01-29 ENCOUNTER — Other Ambulatory Visit (HOSPITAL_COMMUNITY): Payer: Self-pay | Admitting: *Deleted

## 2012-02-01 ENCOUNTER — Encounter (HOSPITAL_COMMUNITY): Payer: BC Managed Care – PPO

## 2012-02-06 ENCOUNTER — Encounter (HOSPITAL_COMMUNITY)
Admission: RE | Admit: 2012-02-06 | Discharge: 2012-02-06 | Disposition: A | Discharge status: Home / Self Care | Payer: Medicare Other | Source: Ambulatory Visit | Attending: Nephrology | Admitting: Nephrology

## 2012-02-06 DIAGNOSIS — N183 Chronic kidney disease, stage 3 unspecified: Secondary | ICD-10-CM | POA: Insufficient documentation

## 2012-02-06 DIAGNOSIS — D638 Anemia in other chronic diseases classified elsewhere: Secondary | ICD-10-CM | POA: Insufficient documentation

## 2012-02-06 MED ORDER — DARBEPOETIN ALFA-POLYSORBATE 40 MCG/0.4ML IJ SOLN
INTRAMUSCULAR | Status: AC
  Filled 2012-02-06: qty 0.4

## 2012-02-06 MED ORDER — DARBEPOETIN ALFA-POLYSORBATE 500 MCG/ML IJ SOLN
40.0000 ug | INTRAMUSCULAR | Status: DC
  Administered 2012-02-06: 40 ug via SUBCUTANEOUS

## 2012-02-06 MED FILL — Darbepoetin Alfa-Polysorbate 80 Soln Inj 40 MCG/0.4ML: INTRAMUSCULAR | Qty: 0.4 | Status: AC

## 2012-03-07 ENCOUNTER — Encounter (HOSPITAL_COMMUNITY): Payer: BC Managed Care – PPO

## 2012-04-04 ENCOUNTER — Other Ambulatory Visit (HOSPITAL_COMMUNITY): Payer: Self-pay | Admitting: *Deleted

## 2012-04-07 ENCOUNTER — Encounter (HOSPITAL_COMMUNITY)
Admission: RE | Admit: 2012-04-07 | Discharge: 2012-04-07 | Disposition: A | Discharge status: Home / Self Care | Payer: Medicare Other | Source: Ambulatory Visit | Attending: Nephrology | Admitting: Nephrology

## 2012-04-07 DIAGNOSIS — N183 Chronic kidney disease, stage 3 unspecified: Secondary | ICD-10-CM | POA: Insufficient documentation

## 2012-04-07 DIAGNOSIS — D638 Anemia in other chronic diseases classified elsewhere: Secondary | ICD-10-CM | POA: Insufficient documentation

## 2012-04-07 MED ORDER — DARBEPOETIN ALFA-POLYSORBATE 40 MCG/0.4ML IJ SOLN
INTRAMUSCULAR | Status: AC
  Administered 2012-04-07: 40 ug via SUBCUTANEOUS
  Filled 2012-04-07: qty 0.4

## 2012-04-07 MED ORDER — DARBEPOETIN ALFA-POLYSORBATE 500 MCG/ML IJ SOLN
40.0000 ug | INTRAMUSCULAR | Status: DC
  Filled 2012-04-07: qty 1

## 2012-05-06 ENCOUNTER — Encounter (HOSPITAL_COMMUNITY): Payer: BC Managed Care – PPO

## 2012-06-02 ENCOUNTER — Other Ambulatory Visit (HOSPITAL_COMMUNITY): Payer: Self-pay | Admitting: *Deleted

## 2012-06-03 ENCOUNTER — Encounter (HOSPITAL_COMMUNITY): Payer: BC Managed Care – PPO

## 2012-06-13 ENCOUNTER — Encounter (HOSPITAL_COMMUNITY): Payer: BC Managed Care – PPO

## 2012-06-13 ENCOUNTER — Encounter (HOSPITAL_COMMUNITY)
Admission: RE | Admit: 2012-06-13 | Discharge: 2012-06-13 | Disposition: A | Discharge status: Home / Self Care | Payer: BC Managed Care – PPO | Source: Ambulatory Visit | Attending: Nephrology | Admitting: Nephrology

## 2012-06-13 DIAGNOSIS — D638 Anemia in other chronic diseases classified elsewhere: Secondary | ICD-10-CM | POA: Insufficient documentation

## 2012-06-13 DIAGNOSIS — N183 Chronic kidney disease, stage 3 unspecified: Secondary | ICD-10-CM | POA: Insufficient documentation

## 2012-07-11 ENCOUNTER — Encounter (HOSPITAL_COMMUNITY)
Admission: RE | Admit: 2012-07-11 | Discharge: 2012-07-11 | Disposition: A | Discharge status: Home / Self Care | Payer: BC Managed Care – PPO | Source: Ambulatory Visit | Attending: Nephrology | Admitting: Nephrology

## 2012-07-11 DIAGNOSIS — N183 Chronic kidney disease, stage 3 unspecified: Secondary | ICD-10-CM | POA: Insufficient documentation

## 2012-07-11 DIAGNOSIS — D638 Anemia in other chronic diseases classified elsewhere: Secondary | ICD-10-CM | POA: Insufficient documentation

## 2012-07-11 MED ORDER — DARBEPOETIN ALFA-POLYSORBATE 500 MCG/ML IJ SOLN: 40.0000 ug | INTRAMUSCULAR | Status: DC

## 2012-08-08 ENCOUNTER — Encounter (HOSPITAL_COMMUNITY)
Admission: RE | Admit: 2012-08-08 | Discharge: 2012-08-08 | Disposition: A | Discharge status: Home / Self Care | Payer: Medicare Other | Source: Ambulatory Visit | Attending: Nephrology | Admitting: Nephrology

## 2012-08-08 DIAGNOSIS — D638 Anemia in other chronic diseases classified elsewhere: Secondary | ICD-10-CM | POA: Insufficient documentation

## 2012-08-08 DIAGNOSIS — N183 Chronic kidney disease, stage 3 unspecified: Secondary | ICD-10-CM | POA: Insufficient documentation

## 2012-08-08 MED ORDER — DARBEPOETIN ALFA-POLYSORBATE 40 MCG/0.4ML IJ SOLN
INTRAMUSCULAR | Status: AC
  Administered 2012-08-08: 40 ug via SUBCUTANEOUS
  Filled 2012-08-08: qty 0.4

## 2012-08-08 MED ORDER — DARBEPOETIN ALFA-POLYSORBATE 500 MCG/ML IJ SOLN: 40.0000 ug | INTRAMUSCULAR | Status: DC

## 2012-09-04 ENCOUNTER — Other Ambulatory Visit (HOSPITAL_COMMUNITY): Payer: Self-pay | Admitting: *Deleted

## 2012-09-05 ENCOUNTER — Encounter (HOSPITAL_COMMUNITY)
Admission: RE | Admit: 2012-09-05 | Discharge: 2012-09-05 | Disposition: A | Discharge status: Home / Self Care | Payer: Medicare Other | Source: Ambulatory Visit | Attending: Nephrology | Admitting: Nephrology

## 2012-09-05 DIAGNOSIS — D638 Anemia in other chronic diseases classified elsewhere: Secondary | ICD-10-CM | POA: Insufficient documentation

## 2012-09-05 DIAGNOSIS — N183 Chronic kidney disease, stage 3 unspecified: Secondary | ICD-10-CM | POA: Insufficient documentation

## 2012-09-05 MED ORDER — DARBEPOETIN ALFA-POLYSORBATE 40 MCG/0.4ML IJ SOLN
INTRAMUSCULAR | Status: AC
  Filled 2012-09-05: qty 0.4

## 2012-09-05 MED ORDER — DARBEPOETIN ALFA-POLYSORBATE 40 MCG/0.4ML IJ SOLN: 40.0000 ug | INTRAMUSCULAR | Status: DC

## 2012-09-05 NOTE — Progress Notes (Signed)
Pt BP too high to givwe injection today.  Took several Bp measurements and called and spoke with Dr Hyman Hopes. Let him know she did have an order for clonidine but that the pt did not want to take it because she had taken her regular blood pressure medicines this morning and the last time she took something extra it made her feel bad.   He requested that she come back next week.  Pt rescheduled for next week.

## 2012-09-12 ENCOUNTER — Encounter (HOSPITAL_COMMUNITY): Payer: BC Managed Care – PPO

## 2012-09-15 ENCOUNTER — Encounter (HOSPITAL_COMMUNITY)
Admission: RE | Admit: 2012-09-15 | Discharge: 2012-09-15 | Disposition: A | Discharge status: Home / Self Care | Payer: Medicare Other | Source: Ambulatory Visit | Attending: Nephrology | Admitting: Nephrology

## 2012-09-15 MED ORDER — DARBEPOETIN ALFA-POLYSORBATE 40 MCG/0.4ML IJ SOLN
INTRAMUSCULAR | Status: AC
  Filled 2012-09-15: qty 0.4

## 2012-09-15 MED ORDER — DARBEPOETIN ALFA-POLYSORBATE 500 MCG/ML IJ SOLN
40.0000 ug | INTRAMUSCULAR | Status: DC
  Filled 2012-09-15: qty 1

## 2012-09-15 NOTE — Progress Notes (Signed)
Patient's BP elevated on this visit.  Patient declined taking PO Clonidine.  Called Dr. Hyman Hopes who stated to hold Aranesp injection and to reschedule patient in two weeks.

## 2012-09-26 ENCOUNTER — Encounter (HOSPITAL_COMMUNITY): Payer: BC Managed Care – PPO

## 2012-10-02 ENCOUNTER — Encounter (HOSPITAL_COMMUNITY)
Admission: RE | Admit: 2012-10-02 | Discharge: 2012-10-02 | Disposition: A | Discharge status: Home / Self Care | Payer: Medicare Other | Source: Ambulatory Visit | Attending: Nephrology | Admitting: Nephrology

## 2012-10-02 DIAGNOSIS — N183 Chronic kidney disease, stage 3 unspecified: Secondary | ICD-10-CM | POA: Insufficient documentation

## 2012-10-02 DIAGNOSIS — D638 Anemia in other chronic diseases classified elsewhere: Secondary | ICD-10-CM | POA: Insufficient documentation

## 2012-10-02 MED ORDER — DARBEPOETIN ALFA-POLYSORBATE 500 MCG/ML IJ SOLN
40.0000 ug | INTRAMUSCULAR | Status: DC
  Filled 2012-10-02: qty 1

## 2012-10-02 MED ORDER — DARBEPOETIN ALFA-POLYSORBATE 40 MCG/0.4ML IJ SOLN
INTRAMUSCULAR | Status: AC
  Administered 2012-10-02: 40 ug via SUBCUTANEOUS
  Filled 2012-10-02: qty 0.4

## 2012-10-03 ENCOUNTER — Encounter (HOSPITAL_COMMUNITY): Payer: BC Managed Care – PPO

## 2012-10-31 ENCOUNTER — Encounter (HOSPITAL_COMMUNITY): Payer: BC Managed Care – PPO

## 2012-12-03 ENCOUNTER — Other Ambulatory Visit (HOSPITAL_COMMUNITY): Payer: Self-pay | Admitting: *Deleted

## 2012-12-04 ENCOUNTER — Encounter (HOSPITAL_COMMUNITY): Payer: BC Managed Care – PPO

## 2012-12-15 ENCOUNTER — Encounter (HOSPITAL_COMMUNITY): Payer: BC Managed Care – PPO

## 2013-01-16 ENCOUNTER — Encounter (HOSPITAL_COMMUNITY): Payer: BC Managed Care – PPO

## 2013-02-02 ENCOUNTER — Encounter (HOSPITAL_COMMUNITY): Payer: BC Managed Care – PPO

## 2013-03-06 ENCOUNTER — Encounter (HOSPITAL_COMMUNITY): Payer: BC Managed Care – PPO

## 2013-03-25 ENCOUNTER — Other Ambulatory Visit (HOSPITAL_COMMUNITY): Payer: Self-pay | Admitting: *Deleted

## 2013-03-26 ENCOUNTER — Encounter (HOSPITAL_COMMUNITY)
Admission: RE | Admit: 2013-03-26 | Discharge: 2013-03-26 | Disposition: A | Discharge status: Home / Self Care | Payer: Medicare Other | Source: Ambulatory Visit | Attending: Nephrology | Admitting: Nephrology

## 2013-03-31 ENCOUNTER — Encounter (HOSPITAL_COMMUNITY): Payer: BC Managed Care – PPO

## 2013-04-23 ENCOUNTER — Encounter (HOSPITAL_COMMUNITY)
Admission: RE | Admit: 2013-04-23 | Discharge: 2013-04-23 | Disposition: A | Discharge status: Home / Self Care | Payer: Medicare Other | Source: Ambulatory Visit | Attending: Nephrology | Admitting: Nephrology

## 2013-05-20 ENCOUNTER — Other Ambulatory Visit (HOSPITAL_COMMUNITY): Payer: Self-pay | Admitting: *Deleted

## 2013-05-21 ENCOUNTER — Encounter (HOSPITAL_COMMUNITY): Payer: BC Managed Care – PPO

## 2013-06-06 DIAGNOSIS — J45909 Unspecified asthma, uncomplicated: Secondary | ICD-10-CM | POA: Insufficient documentation

## 2013-06-17 ENCOUNTER — Other Ambulatory Visit (HOSPITAL_COMMUNITY): Payer: Self-pay

## 2013-06-18 ENCOUNTER — Encounter (HOSPITAL_COMMUNITY): Payer: BC Managed Care – PPO

## 2013-08-07 ENCOUNTER — Encounter (HOSPITAL_COMMUNITY)
Admission: RE | Admit: 2013-08-07 | Discharge: 2013-08-07 | Disposition: A | Discharge status: Home / Self Care | Payer: Medicare Other | Source: Ambulatory Visit | Attending: Nephrology | Admitting: Nephrology

## 2013-08-07 DIAGNOSIS — D638 Anemia in other chronic diseases classified elsewhere: Secondary | ICD-10-CM | POA: Insufficient documentation

## 2013-08-07 DIAGNOSIS — N183 Chronic kidney disease, stage 3 unspecified: Secondary | ICD-10-CM | POA: Insufficient documentation

## 2013-08-07 MED ORDER — DARBEPOETIN ALFA-POLYSORBATE 40 MCG/0.4ML IJ SOLN
INTRAMUSCULAR | Status: AC
  Administered 2013-08-07: 40 ug via SUBCUTANEOUS
  Filled 2013-08-07: qty 0.4

## 2013-08-07 MED ORDER — CLONIDINE HCL 0.1 MG PO TABS
ORAL_TABLET | ORAL | Status: AC
  Administered 2013-08-07: 0.1 mg
  Filled 2013-08-07: qty 1

## 2013-08-07 MED ORDER — DARBEPOETIN ALFA-POLYSORBATE 500 MCG/ML IJ SOLN
40.0000 ug | INTRAMUSCULAR | Status: DC
  Filled 2013-08-07: qty 1

## 2013-08-07 NOTE — Progress Notes (Signed)
BP remains 184/67 30 minutes after Clonidine0.1 mg given; Spoke with [place] states to proceed with giving Aranesp 40 mcg.

## 2013-09-02 ENCOUNTER — Other Ambulatory Visit (HOSPITAL_COMMUNITY): Payer: Self-pay | Admitting: *Deleted

## 2013-09-03 ENCOUNTER — Encounter (HOSPITAL_COMMUNITY): Payer: BC Managed Care – PPO

## 2013-10-01 ENCOUNTER — Encounter (HOSPITAL_COMMUNITY): Payer: BC Managed Care – PPO

## 2013-10-07 ENCOUNTER — Encounter (INDEPENDENT_AMBULATORY_CARE_PROVIDER_SITE_OTHER): Payer: Self-pay

## 2013-10-07 ENCOUNTER — Ambulatory Visit (HOSPITAL_COMMUNITY): Payer: Medicare Other | Attending: Cardiology

## 2013-10-07 DIAGNOSIS — I10 Essential (primary) hypertension: Secondary | ICD-10-CM | POA: Insufficient documentation

## 2013-10-07 DIAGNOSIS — J449 Chronic obstructive pulmonary disease, unspecified: Secondary | ICD-10-CM | POA: Insufficient documentation

## 2013-10-07 DIAGNOSIS — I2510 Atherosclerotic heart disease of native coronary artery without angina pectoris: Secondary | ICD-10-CM | POA: Insufficient documentation

## 2013-10-07 DIAGNOSIS — I658 Occlusion and stenosis of other precerebral arteries: Secondary | ICD-10-CM | POA: Insufficient documentation

## 2013-10-07 DIAGNOSIS — I6529 Occlusion and stenosis of unspecified carotid artery: Secondary | ICD-10-CM | POA: Insufficient documentation

## 2013-10-07 DIAGNOSIS — E785 Hyperlipidemia, unspecified: Secondary | ICD-10-CM | POA: Insufficient documentation

## 2013-10-07 DIAGNOSIS — E119 Type 2 diabetes mellitus without complications: Secondary | ICD-10-CM | POA: Insufficient documentation

## 2013-10-07 DIAGNOSIS — J4489 Other specified chronic obstructive pulmonary disease: Secondary | ICD-10-CM | POA: Insufficient documentation

## 2013-10-12 ENCOUNTER — Telehealth: Payer: Self-pay

## 2013-10-12 NOTE — Telephone Encounter (Signed)
Message copied by Jarvis Newcomer on Mon Oct 12, 2013 10:22 AM ------      Message from: Verdis Prime      Created: Sun Oct 11, 2013  4:39 PM       Less than 60% RICA and <40% LICA stenosis. Stable findings and needs f/u 1 year. ------

## 2013-11-11 ENCOUNTER — Encounter (HOSPITAL_COMMUNITY): Payer: Medicare Other

## 2013-12-16 DIAGNOSIS — R609 Edema, unspecified: Secondary | ICD-10-CM | POA: Insufficient documentation

## 2013-12-30 ENCOUNTER — Encounter (HOSPITAL_COMMUNITY): Payer: Self-pay | Admitting: Emergency Medicine

## 2013-12-30 ENCOUNTER — Ambulatory Visit (INDEPENDENT_AMBULATORY_CARE_PROVIDER_SITE_OTHER): Payer: 59 | Admitting: Podiatry

## 2013-12-30 ENCOUNTER — Emergency Department (HOSPITAL_COMMUNITY)
Admission: EM | Admit: 2013-12-30 | Discharge: 2013-12-30 | Disposition: A | Discharge status: Home / Self Care | Payer: Medicare Other | Attending: Emergency Medicine | Admitting: Emergency Medicine

## 2013-12-30 ENCOUNTER — Encounter: Payer: Self-pay | Admitting: Podiatry

## 2013-12-30 VITALS — BP 200/89 | HR 63 | Ht <= 58 in | Wt 203.0 lb

## 2013-12-30 DIAGNOSIS — E119 Type 2 diabetes mellitus without complications: Secondary | ICD-10-CM | POA: Insufficient documentation

## 2013-12-30 DIAGNOSIS — R609 Edema, unspecified: Secondary | ICD-10-CM

## 2013-12-30 DIAGNOSIS — M79609 Pain in unspecified limb: Secondary | ICD-10-CM | POA: Insufficient documentation

## 2013-12-30 DIAGNOSIS — I252 Old myocardial infarction: Secondary | ICD-10-CM | POA: Insufficient documentation

## 2013-12-30 DIAGNOSIS — Z792 Long term (current) use of antibiotics: Secondary | ICD-10-CM | POA: Insufficient documentation

## 2013-12-30 DIAGNOSIS — I10 Essential (primary) hypertension: Secondary | ICD-10-CM | POA: Insufficient documentation

## 2013-12-30 DIAGNOSIS — M79672 Pain in left foot: Secondary | ICD-10-CM

## 2013-12-30 DIAGNOSIS — I739 Peripheral vascular disease, unspecified: Secondary | ICD-10-CM

## 2013-12-30 DIAGNOSIS — R600 Localized edema: Secondary | ICD-10-CM

## 2013-12-30 DIAGNOSIS — Z79899 Other long term (current) drug therapy: Secondary | ICD-10-CM | POA: Insufficient documentation

## 2013-12-30 DIAGNOSIS — M7989 Other specified soft tissue disorders: Secondary | ICD-10-CM | POA: Insufficient documentation

## 2013-12-30 DIAGNOSIS — Z95818 Presence of other cardiac implants and grafts: Secondary | ICD-10-CM | POA: Insufficient documentation

## 2013-12-30 DIAGNOSIS — G8929 Other chronic pain: Secondary | ICD-10-CM

## 2013-12-30 DIAGNOSIS — J45909 Unspecified asthma, uncomplicated: Secondary | ICD-10-CM | POA: Insufficient documentation

## 2013-12-30 DIAGNOSIS — Z87448 Personal history of other diseases of urinary system: Secondary | ICD-10-CM | POA: Insufficient documentation

## 2013-12-30 HISTORY — DX: Essential (primary) hypertension: I10

## 2013-12-30 MED ORDER — TRAMADOL HCL 50 MG PO TABS: 50.0000 mg | ORAL_TABLET | Freq: Four times a day (QID) | ORAL | Status: DC | PRN

## 2013-12-30 NOTE — ED Notes (Signed)
Bed: WA19 Expected date:  Expected time:  Means of arrival:  Comments: Hold for Triage 1. 

## 2013-12-30 NOTE — Patient Instructions (Signed)
Seen for pain on left foot. Need vascular attention on left lower limb. Patient will check into ER to have vascular consultation done.

## 2013-12-30 NOTE — Progress Notes (Signed)
79 year old female presents accompanied by her daughter with pain on both feet, left foot is very bad. Pain is all the time. Been hurting for the past 2 months. Stated that she was seen her PCP and was put on antibiotics. Now she is taking 2nd round of antibiotics.   Objective: Vascular: Pedal pulses are not palpable. Cold, purple red left foot.  Edema left foot.  Severe pain with light pressure on left foot. Mild discomfort on right.  Dermatologic: No open lesions. Neurologic: All epicritic sensations grossly intact with hyperalgic foot left. Orthopedic: Rectus foot.  Assessment: PVD with ischemic foot pain left.  Plan: Vascular consultation. Patient's daughter will take her to ER since she cannot get an appointment with Vascular doctor till next week.

## 2013-12-30 NOTE — ED Notes (Signed)
Pt c/o L foot pain and swelling x "a couple months."  Pain score 10/10.  Pt was seen at a Podiatrists office this morning and sent here for a vascular consultation.  Swelling noted.  Cap refill < 3 sec.

## 2013-12-30 NOTE — ED Provider Notes (Signed)
CSN: [id number]     Arrival date & time 12/30/13  1243 History   None    Chief Complaint  Patient presents with  . Foot Swelling  . Foot Pain   (Consider location/radiation/quality/duration/timing/severity/associated sxs/prior Treatment) Patient is a 79 y.o. female presenting with lower extremity pain.  Foot Pain Pertinent negatives include no abdominal pain, chest pain, chills, congestion, coughing, fever, joint swelling, myalgias, nausea, numbness, rash, sore throat, vomiting or weakness.   79 yo female presents with 2 month hx of gradual onset LEFT foot pain with associating swelling and redness. Patient states she has some pain in Right foot as well but not as much as LEFT. Pain is described as achey, burning/tingling that is constant without radiation. Pain localized to LEFT forefoot with worsening pain at distal toes. Patient denies any recent illness, infection, or trauma. No recent travel, surgery, or hx of cancer. Patient states seen by family doctor for same issue. Seen by podiatry today and told it may be vascular in nature. Patient been tried on Keflex and currently on Clindamycin and lotrimin foot spray without any improvement in pain or swelling. Patient states also tested negative for gout (blood uric acid), though she has no hx of gout. Patient denies any claudication pain. PMH significant for Type 2 DM, Renal disorder, HTN, and prior MI with cardiac cath.  Past Medical History  Diagnosis Date  . Diabetes mellitus without complication   . Hypertension   . Renal disorder   . MI (myocardial infarction)   . Asthma    Past Surgical History  Procedure Laterality Date  . Cardiac catheterization     History reviewed. No pertinent family history. History  Substance Use Topics  . Smoking status: Never Smoker   . Smokeless tobacco: Never Used  . Alcohol Use: No   OB History   Grav Para Term Preterm Abortions TAB SAB Ect Mult Living                 Review of Systems   Constitutional: Negative for fever and chills.  HENT: Negative for congestion, sinus pressure and sore throat.   Respiratory: Negative for cough and shortness of breath.   Cardiovascular: Negative for chest pain and palpitations.  Gastrointestinal: Negative for nausea, vomiting, abdominal pain, diarrhea and constipation.  Musculoskeletal: Negative for back pain, gait problem, joint swelling and myalgias.  Skin: Negative for rash.  Neurological: Negative for weakness and numbness.    Allergies  Review of patient's allergies indicates no known allergies.  Home Medications   Current Outpatient Rx  Name  Route  Sig  Dispense  Refill  . amLODipine (NORVASC) 10 MG tablet   Oral   Take 10 mg by mouth daily.         . benazepril (LOTENSIN) 10 MG tablet   Oral   Take 10 mg by mouth daily.          . clindamycin (CLEOCIN) 300 MG capsule   Oral   Take 300 mg by mouth 3 (three) times daily.          . fluconazole (DIFLUCAN) 150 MG tablet               . folic acid-vitamin b complex-vitamin c-selenium-zinc (DIALYVITE) 3 MG TABS tablet   Oral   Take 1 tablet by mouth daily.         . furosemide (LASIX) 80 MG tablet   Oral   Take 40 mg by mouth daily.         [name]  JANUVIA 25 MG tablet   Oral   Take 25 mg by mouth daily.          . metoprolol (LOPRESSOR) 50 MG tablet   Oral   Take 50 mg by mouth 2 (two) times daily.          . paricalcitol (ZEMPLAR) 1 MCG capsule   Oral   Take 1 mcg by mouth daily.          . traMADol (ULTRAM) 50 MG tablet   Oral   Take 1 tablet (50 mg total) by mouth every 6 (six) hours as needed.   15 tablet   0    BP 170/71  Pulse 72  Temp(Src) 98.3 F (36.8 C) (Oral)  Resp 22  SpO2 98% Physical Exam  Nursing note and vitals reviewed. Constitutional: She is oriented to person, place, and time. She appears well-developed and well-nourished. No distress.  HENT:  Head: Normocephalic and atraumatic.  Eyes: Conjunctivae and EOM  are normal.  Neck: Normal range of motion. Neck supple. No JVD present.  Cardiovascular: Normal rate and regular rhythm.  Exam reveals no gallop and no friction rub.   No murmur heard. Pulses:      Dorsalis pedis pulses are 1+ on the right side, and 0 on the left side.       Posterior tibial pulses are 1+ on the right side, and 1+ on the left side.  Doppler US of DP pulse undetected on LEFT and Biphasic on RIGHT. PT pulse present bilaterally and noted to be Biphasic.   ABI done by me in room: RIGHT  0.651; LEFT 0.639  Pulmonary/Chest: Effort normal and breath sounds normal. No respiratory distress. She has no wheezes. She has no rales.  Musculoskeletal: Normal range of motion. She exhibits edema (LEFT foot).  Dorsal swelling and erythema of LEFT foot with tenderness to palpation of distal toes.   No swelling or erythema of RIGHT foot noted.   No signs of ulceration, fissure or injury to feet bilaterally.   Mild hypertrophic toe nails bilaterally, worse on LEFT.      Neurological: She is alert and oriented to person, place, and time.  Skin: Skin is warm and dry. She is not diaphoretic.  Psychiatric: She has a normal mood and affect. Her behavior is normal.    ED Course  Procedures (including critical care time) Labs Review Labs Reviewed - No data to display Imaging Review No results found.  EKG Interpretation   None       MDM   1. Edema of left foot   2. Foot pain, left     Well's Score is -1. No signs of infection. No hx of MRSA infection. LEFT foot minimally cooler to touch than RIGHT foot. Suspect unilateral foot swelling and pain related to arterial insufficiency.   Discussed  exam findings with patient. Advised follow up with Vascular Surgery as soon as possible.  Recommend return to ED should symptoms worsen, increase in pain, paleness or becomes cool to touch of affected foot. Patient agrees with plan. Discharged in good condition.   Meds given in  ED:  Medications - No data to display  Discharge Medication List as of 12/30/2013  4:34 PM    START taking these medications   Details  traMADol (ULTRAM) 50 MG tablet Take 1 tablet (50 mg total) by mouth every 6 (six) hours as needed., Starting 12/30/2013, Until Discontinued, Print          Allen Norris  Duke Salvia, PA-C 12/31/13 [phone]

## 2013-12-30 NOTE — ED Provider Notes (Signed)
  Face-to-face evaluation   History: Gradually worse left foot pain with swelling and pain, not responsive to oral ABX.   Physical exam: Obese, elderly in mild pain. Left foot is red on forefoot with hyperemic toes. Moderate pain with mvt. of toes. Likely intertriginous fungal infx. No palpable pulse in left foot; 1-2+ PT  pulse with doppler. Left foot is NTTP. ABI done; both 0.6.    Discussed with Vascular Surgeon, will see in office.  Medical screening examination/treatment/procedure(s) were conducted as a shared visit with non-physician practitioner(s) and myself.  I personally evaluated the patient during the encounter  Flint Melter, MD 12/31/13 2310

## 2013-12-30 NOTE — Progress Notes (Signed)
   CARE MANAGEMENT ED NOTE 12/30/2013  Patient:  Dorothy Bartlett, Dorothy Bartlett   Account Number:  [id number]  Date Initiated:  12/30/2013  Documentation initiated by:  Edd Arbour  Subjective/Objective Assessment:   79 yr old female [employer] medicare complete pt,no pcp listed in EPIC pt states pcp is roy moreira     Subjective/Objective Assessment Detail:     Action/Plan:   EPIC updated   Action/Plan Detail:   Anticipated DC Date:       Status Recommendation to Physician:   Result of Recommendation:    Other ED Services  Consult Working Plan    DC Planning Services  Other  PCP issues  Outpatient Services - Pt will follow up    Choice offered to / List presented to:            Status of service:  Completed, signed off  ED Comments:   ED Comments Detail:

## 2014-01-04 ENCOUNTER — Telehealth: Payer: Self-pay | Admitting: Interventional Cardiology

## 2014-01-04 ENCOUNTER — Encounter: Payer: Self-pay | Admitting: Vascular Surgery

## 2014-01-04 NOTE — Telephone Encounter (Signed)
New Prob    Pts daughter has some questions regarding possible stents needing to be placed for pt. Please call.

## 2014-01-04 NOTE — Telephone Encounter (Signed)
returned pt call. pt daughter sts that pt was complaining of left foot pain and [facility]. pt daughter sts that pt is to f/u with a vascular surgeon Dr.James Hart RochesterLawson. pt will keep upcoming appt with him on 01/05/14

## 2014-01-05 ENCOUNTER — Ambulatory Visit (INDEPENDENT_AMBULATORY_CARE_PROVIDER_SITE_OTHER): Payer: Medicare Other | Admitting: Vascular Surgery

## 2014-01-05 ENCOUNTER — Encounter (HOSPITAL_COMMUNITY): Payer: Medicare Other

## 2014-01-05 ENCOUNTER — Ambulatory Visit (HOSPITAL_COMMUNITY)
Admission: RE | Admit: 2014-01-05 | Discharge: 2014-01-05 | Disposition: A | Discharge status: Home / Self Care | Payer: Medicare Other | Source: Ambulatory Visit | Attending: Vascular Surgery | Admitting: Vascular Surgery

## 2014-01-05 ENCOUNTER — Encounter: Payer: Self-pay | Admitting: Vascular Surgery

## 2014-01-05 ENCOUNTER — Other Ambulatory Visit: Payer: Self-pay | Admitting: Vascular Surgery

## 2014-01-05 ENCOUNTER — Other Ambulatory Visit: Payer: Self-pay | Admitting: *Deleted

## 2014-01-05 ENCOUNTER — Encounter: Payer: Medicare Other | Admitting: Vascular Surgery

## 2014-01-05 ENCOUNTER — Ambulatory Visit (INDEPENDENT_AMBULATORY_CARE_PROVIDER_SITE_OTHER)
Admission: RE | Admit: 2014-01-05 | Discharge: 2014-01-05 | Disposition: A | Discharge status: Home / Self Care | Payer: Medicare Other | Source: Ambulatory Visit

## 2014-01-05 VITALS — BP 187/78 | HR 59 | Resp 16 | Ht <= 58 in | Wt 203.7 lb

## 2014-01-05 DIAGNOSIS — I739 Peripheral vascular disease, unspecified: Secondary | ICD-10-CM

## 2014-01-05 DIAGNOSIS — M7989 Other specified soft tissue disorders: Secondary | ICD-10-CM | POA: Insufficient documentation

## 2014-01-05 DIAGNOSIS — M79609 Pain in unspecified limb: Secondary | ICD-10-CM | POA: Insufficient documentation

## 2014-01-05 DIAGNOSIS — R0989 Other specified symptoms and signs involving the circulatory and respiratory systems: Secondary | ICD-10-CM

## 2014-01-05 NOTE — Progress Notes (Signed)
Patient name: Dorothy Bartlett MRN: [id number] DOB: Apr 07, 1926 Sex: female   Referred by: Ludwig Clarks  Reason for referral:  Chief Complaint  Patient presents with  . New Evaluation    c/o leg pain bilateral leg pain (throbbing, achy) ,  L>R  , swelling bilateral ankles and feet            HISTORY OF PRESENT ILLNESS: The patient is a very pleasant 80 year old female who presents today with bilateral lower extremity pain. This is much worse on the left leg than the right leg. She has other pain and has been seen in the emergency department for treatment. She has been taking Ultram every 6 hours with some relief. She reports pain specifically in her left foot extending up onto her calf. She does walk but has difficulty and pain associated with this as well. She did have a possible fungal infection between the web spaces of her toes on the left but this is cleared with over-the-counter Lotrimin. She does have known renal insufficiency and does have a history of coronary artery disease with prior myocardial infarction and coronary angioplasty and stenting.  Past Medical History  Diagnosis Date  . Diabetes mellitus without complication   . Hypertension   . Renal disorder   . MI (myocardial infarction)   . Asthma     Past Surgical History  Procedure Laterality Date  . Cardiac catheterization    . Repair of nerve in left arm  Left 2000    History   Social History  . Marital Status: Widowed    Spouse Name: N/A    Number of Children: N/A  . Years of Education: N/A   Occupational History  . Not on file.   Social History Main Topics  . Smoking status: Never Smoker   . Smokeless tobacco: Never Used  . Alcohol Use: No  . Drug Use: No  . Sexual Activity: Not on file   Other Topics Concern  . Not on file   Social History Narrative  . No narrative on file    Family History  Problem Relation Age of Onset  . Diabetes Father   . Hypertension Father   . Hypertension Daughter     . Hyperlipidemia Daughter   . Hyperlipidemia Son     Allergies as of 01/05/2014  . (No Known Allergies)    Current Outpatient Prescriptions on File Prior to Visit  Medication Sig Dispense Refill  . amLODipine (NORVASC) 10 MG tablet Take 10 mg by mouth daily.      . clindamycin (CLEOCIN) 300 MG capsule Take 300 mg by mouth 3 (three) times daily.       . folic acid-vitamin b complex-vitamin c-selenium-zinc (DIALYVITE) 3 MG TABS tablet Take 1 tablet by mouth daily.      . furosemide (LASIX) 80 MG tablet Take 40 mg by mouth daily.      [name] JANUVIA 25 MG tablet Take 25 mg by mouth daily.       . metoprolol (LOPRESSOR) 50 MG tablet Take 50 mg by mouth 2 (two) times daily.       . paricalcitol (ZEMPLAR) 1 MCG capsule Take 1 mcg by mouth daily.       . traMADol (ULTRAM) 50 MG tablet Take 1 tablet (50 mg total) by mouth every 6 (six) hours as needed.  15 tablet  0  . benazepril (LOTENSIN) 10 MG tablet Take 10 mg by mouth daily.       [name]  fluconazole (DIFLUCAN) 150 MG tablet        No current facility-administered medications on file prior to visit.     REVIEW OF SYSTEMS:  Positives indicated with an "X"  CARDIOVASCULAR:  [ ]  chest pain   [ ]  chest pressure   [ ]  palpitations   [ ]  orthopnea   [ ]  dyspnea on exertion   [ ]  claudication   [x ] rest pain   [ ]  DVT   [ ]  phlebitis PULMONARY:   [ ]  productive cough   [x ] asthma   [x ] wheezing NEUROLOGIC:   [ ]  weakness  [ ]  paresthesias  [ ]  aphasia  [ ]  amaurosis  [ ]  dizziness HEMATOLOGIC:   [ ]  bleeding problems   [ ]  clotting disorders MUSCULOSKELETAL:  [ ]  joint pain   [ ]  joint swelling GASTROINTESTINAL: [ ]   blood in stool  [ ]   hematemesis GENITOURINARY:  [ ]   dysuria  [ ]   hematuria PSYCHIATRIC:  [ ]  history of major depression INTEGUMENTARY:  [ ]  rashes  [ ]  ulcers CONSTITUTIONAL:  [ ]  fever   [ ]  chills  PHYSICAL EXAMINATION:  General: The patient is a well-nourished female, in no acute distress. Vital signs are BP 187/78   Pulse 59  Resp 16  Ht 4\' 9"  (1.448 m)  Wt 203 lb 11.2 oz (92.398 kg)  BMI 44.07 kg/m2 Pulmonary: There is a good air exchange bilaterally without wheezing or rales. Abdomen: Soft and non-tender with normal pitch bowel sounds. Musculoskeletal: There are no major deformities.  She does have dependent rubor in her left foot. Neurologic: No focal weakness or paresthesias are detected, Skin: There are no ulcer or rashes noted. Psychiatric: The patient has normal affect. Cardiovascular: There is a regular rate and rhythm without significant murmur appreciated. Pulse status: 2+ radial pulses bilaterally 2+ femoral pulses bilaterally she has a palpable right popliteal pulse absent right pedal pulses. On the left there is absent popliteal and pedal pulses Carotid arteries without bruits bilaterally  VVS Vascular Lab Studies:  Ordered and Independently Reviewed monophasic tibial waveforms bilaterally. Ankle arm indices not reliable do to calcific disease. Absent digital waveforms on left and 10 on the right  Impression and Plan:  Bilateral lower extremity arterial insufficiency left greater than right. I do feel that she has severe ischemia in her left foot with limb threatening ischemia. She does have a known renal insufficiency with a baseline creatinine of 1.8. I explained the need for arteriography for further evaluation and possible treatment. She will be admitted to the hospital on 1/8 for IV hydration and arteriogram and possible angioplasty with limited contrast on 1/9. If she has treatment option for angioplasty this will be undertaken at that time. If not we will discuss possible bypass. If she requires surgery we would have her see Dr. Katrinka BlazingSmith for cardiology clearance   Bane Hagy [facility] Office: [phone]

## 2014-01-07 ENCOUNTER — Inpatient Hospital Stay (HOSPITAL_COMMUNITY)
Admission: RE | Admit: 2014-01-07 | Discharge: 2014-01-08 | DRG: 301 | Disposition: A | Discharge status: Home / Self Care | Payer: Medicare Other | Source: Ambulatory Visit | Attending: Vascular Surgery | Admitting: Vascular Surgery

## 2014-01-07 ENCOUNTER — Other Ambulatory Visit: Payer: Self-pay | Admitting: Physician Assistant

## 2014-01-07 DIAGNOSIS — I10 Essential (primary) hypertension: Secondary | ICD-10-CM | POA: Diagnosis present

## 2014-01-07 DIAGNOSIS — E119 Type 2 diabetes mellitus without complications: Secondary | ICD-10-CM | POA: Diagnosis present

## 2014-01-07 DIAGNOSIS — J45909 Unspecified asthma, uncomplicated: Secondary | ICD-10-CM | POA: Diagnosis present

## 2014-01-07 DIAGNOSIS — I743 Embolism and thrombosis of arteries of the lower extremities: Principal | ICD-10-CM | POA: Diagnosis present

## 2014-01-07 DIAGNOSIS — I252 Old myocardial infarction: Secondary | ICD-10-CM

## 2014-01-07 LAB — PROTIME-INR
INR: 0.92 (ref 0.00–1.49)
PROTHROMBIN TIME: 12.2 s (ref 11.6–15.2)

## 2014-01-07 LAB — BASIC METABOLIC PANEL
BUN: 35 mg/dL — ABNORMAL HIGH (ref 6–23)
CHLORIDE: 103 meq/L (ref 96–112)
CO2: 23 mEq/L (ref 19–32)
Calcium: 9.8 mg/dL (ref 8.4–10.5)
Creatinine, Ser: 2.82 mg/dL — ABNORMAL HIGH (ref 0.50–1.10)
GFR calc non Af Amer: 14 mL/min — ABNORMAL LOW (ref >90)
GFR, EST AFRICAN AMERICAN: 17 mL/min — AB (ref >90)
Glucose, Bld: 186 mg/dL — ABNORMAL HIGH (ref 70–99)
POTASSIUM: 4.3 meq/L (ref 3.7–5.3)
Sodium: 141 mEq/L (ref 137–147)

## 2014-01-07 LAB — CBC
HCT: 38.4 % (ref 36.0–46.0)
HEMOGLOBIN: 12.7 g/dL (ref 12.0–15.0)
MCH: 30 pg (ref 26.0–34.0)
MCHC: 33.1 g/dL (ref 30.0–36.0)
MCV: 90.8 fL (ref 78.0–100.0)
Platelets: 273 10*3/uL (ref 150–400)
RBC: 4.23 MIL/uL (ref 3.87–5.11)
RDW: 13.5 % (ref 11.5–15.5)
WBC: 10.3 10*3/uL (ref 4.0–10.5)

## 2014-01-07 MED ORDER — ALUM & MAG HYDROXIDE-SIMETH 200-200-20 MG/5ML PO SUSP: 15.0000 mL | ORAL | Status: DC | PRN

## 2014-01-07 MED ORDER — HYDRALAZINE HCL 20 MG/ML IJ SOLN
10.0000 mg | INTRAMUSCULAR | Status: DC | PRN
  Administered 2014-01-07: 10 mg via INTRAVENOUS
  Filled 2014-01-07: qty 1

## 2014-01-07 MED ORDER — PANTOPRAZOLE SODIUM 40 MG PO TBEC
40.0000 mg | DELAYED_RELEASE_TABLET | Freq: Every day | ORAL | Status: DC
  Administered 2014-01-07: 40 mg via ORAL
  Filled 2014-01-07: qty 1

## 2014-01-07 MED ORDER — POTASSIUM CHLORIDE CRYS ER 20 MEQ PO TBCR
20.0000 meq | EXTENDED_RELEASE_TABLET | Freq: Once | ORAL | Status: DC
  Filled 2014-01-07: qty 2

## 2014-01-07 MED ORDER — SODIUM BICARBONATE BOLUS VIA INFUSION
INTRAVENOUS | Status: DC
  Administered 2014-01-08: 275 meq via INTRAVENOUS
  Filled 2014-01-07: qty 1

## 2014-01-07 MED ORDER — PHENOL 1.4 % MT LIQD
1.0000 | OROMUCOSAL | Status: DC | PRN
  Filled 2014-01-07: qty 177

## 2014-01-07 MED ORDER — GUAIFENESIN-DM 100-10 MG/5ML PO SYRP: 15.0000 mL | ORAL_SOLUTION | ORAL | Status: DC | PRN

## 2014-01-07 MED ORDER — MORPHINE SULFATE 2 MG/ML IJ SOLN: 2.0000 mg | INTRAMUSCULAR | Status: DC | PRN

## 2014-01-07 MED ORDER — LABETALOL HCL 5 MG/ML IV SOLN
10.0000 mg | INTRAVENOUS | Status: DC | PRN
  Filled 2014-01-07: qty 4

## 2014-01-07 MED ORDER — ACETAMINOPHEN 650 MG RE SUPP: 325.0000 mg | RECTAL | Status: DC | PRN

## 2014-01-07 MED ORDER — ONDANSETRON HCL 4 MG/2ML IJ SOLN: 4.0000 mg | Freq: Four times a day (QID) | INTRAMUSCULAR | Status: DC | PRN

## 2014-01-07 MED ORDER — SODIUM BICARBONATE 8.4 % IV SOLN
INTRAVENOUS | Status: AC
  Administered 2014-01-08: 08:00:00 via INTRAVENOUS
  Filled 2014-01-07: qty 1000

## 2014-01-07 MED ORDER — OXYCODONE HCL 5 MG PO TABS: 5.0000 mg | ORAL_TABLET | ORAL | Status: DC | PRN

## 2014-01-07 MED ORDER — SODIUM CHLORIDE 0.9 % IV SOLN
INTRAVENOUS | Status: DC
  Administered 2014-01-07: 22:00:00 via INTRAVENOUS

## 2014-01-07 MED ORDER — ACETAMINOPHEN 325 MG PO TABS: 325.0000 mg | ORAL_TABLET | ORAL | Status: DC | PRN

## 2014-01-07 MED ORDER — METOPROLOL TARTRATE 1 MG/ML IV SOLN: 2.0000 mg | INTRAVENOUS | Status: DC | PRN

## 2014-01-07 NOTE — Progress Notes (Signed)
MEDICATION RELATED CONSULT NOTE - INITIAL   Pharmacy Consult for Sodium Bicarb drip  Indication: contrast-nephropathy prevention  No Known Allergies  Patient Measurements: Height: 4\' 9"  (144.8 cm) Weight: 202 lb 8 oz (91.853 kg) IBW/kg (Calculated) : 38.6  Labs:  Recent Labs  01/07/14 2010  WBC 10.3  HGB 12.7  HCT 38.4  PLT 273  CREATININE 2.82*   Estimated Creatinine Clearance: 13.8 ml/min (by C-G formula based on Cr of 2.82).   Microbiology: No results found for this or any previous visit (from the past 720 hour(s)).  Medical History: Past Medical History  Diagnosis Date  . Diabetes mellitus without complication   . Hypertension   . Renal disorder   . MI (myocardial infarction)   . Asthma    Assessment:   LLE angiogram scheduled for 9am on 1/9.  Serum creatinine 2.82.  Admitted tonight for hydration.  For bicarb drip peri-procedure.  Currently has NS at 75 ml/hr.  Goal of Therapy:   contrast-nephropathy prevention  Plan:   Bicarb drip should begin 1 hour prior to procedure at ~ 3 ml/kg/hr, then decrease to ~ 1 ml/kg/hr during procedure, then continue x 6 hours post-procedure.  Daily bmet already ordered.  Bicarb drip will need dedicated IV line.  Dennie FettersEgan, Fredick Schlosser Donovan, RPh Pager: [phone] 01/07/2014,10:16 PM

## 2014-01-07 NOTE — Progress Notes (Signed)
Pt has an order for Sodium Bicarb,but there is no order. Spoke with MD on call, pharmacist, and clinical pharmacist about getting the order corrected. Clinical pharmacist will correct the order.    Julie-Anne Torain M

## 2014-01-07 NOTE — Progress Notes (Signed)
MD on call made aware that pt's home medications are not ordered. MD on call said not to reorder home meds at this time.  Dorothy Bartlett M

## 2014-01-08 ENCOUNTER — Telehealth: Payer: Self-pay

## 2014-01-08 ENCOUNTER — Encounter (HOSPITAL_COMMUNITY)
Admission: RE | Disposition: A | Discharge status: Home / Self Care | Payer: Self-pay | Source: Ambulatory Visit | Attending: Vascular Surgery

## 2014-01-08 ENCOUNTER — Ambulatory Visit (HOSPITAL_COMMUNITY): Admission: RE | Admit: 2014-01-08 | Payer: Medicare Other | Source: Ambulatory Visit | Admitting: Vascular Surgery

## 2014-01-08 ENCOUNTER — Other Ambulatory Visit: Payer: Self-pay | Admitting: *Deleted

## 2014-01-08 DIAGNOSIS — I739 Peripheral vascular disease, unspecified: Secondary | ICD-10-CM

## 2014-01-08 DIAGNOSIS — Z01818 Encounter for other preprocedural examination: Secondary | ICD-10-CM

## 2014-01-08 DIAGNOSIS — I70229 Atherosclerosis of native arteries of extremities with rest pain, unspecified extremity: Secondary | ICD-10-CM

## 2014-01-08 DIAGNOSIS — Z01810 Encounter for preprocedural cardiovascular examination: Secondary | ICD-10-CM

## 2014-01-08 HISTORY — PX: LOWER EXTREMITY ANGIOGRAM: SHX5508

## 2014-01-08 HISTORY — PX: ABDOMINAL ANGIOGRAM: SHX5499

## 2014-01-08 LAB — BASIC METABOLIC PANEL
BUN: 34 mg/dL — AB (ref 6–23)
CHLORIDE: 103 meq/L (ref 96–112)
CO2: 23 mEq/L (ref 19–32)
CREATININE: 2.76 mg/dL — AB (ref 0.50–1.10)
Calcium: 9.3 mg/dL (ref 8.4–10.5)
GFR calc Af Amer: 17 mL/min — ABNORMAL LOW (ref >90)
GFR, EST NON AFRICAN AMERICAN: 15 mL/min — AB (ref >90)
Glucose, Bld: 242 mg/dL — ABNORMAL HIGH (ref 70–99)
POTASSIUM: 4.2 meq/L (ref 3.7–5.3)
Sodium: 141 mEq/L (ref 137–147)

## 2014-01-08 LAB — GLUCOSE, CAPILLARY: Glucose-Capillary: 277 mg/dL — ABNORMAL HIGH (ref 70–99)

## 2014-01-08 SURGERY — ANGIOGRAM, LOWER EXTREMITY
Anesthesia: LOCAL | Laterality: Left

## 2014-01-08 MED ORDER — AMLODIPINE BESYLATE 10 MG PO TABS
10.0000 mg | ORAL_TABLET | Freq: Every day | ORAL | Status: DC
  Administered 2014-01-08: 10 mg via ORAL
  Filled 2014-01-08: qty 1

## 2014-01-08 MED ORDER — HEPARIN (PORCINE) IN NACL 2-0.9 UNIT/ML-% IJ SOLN
INTRAMUSCULAR | Status: AC
  Filled 2014-01-08: qty 500

## 2014-01-08 MED ORDER — ASPIRIN EC 81 MG PO TBEC
81.0000 mg | DELAYED_RELEASE_TABLET | Freq: Every day | ORAL | Status: DC
  Administered 2014-01-08: 81 mg via ORAL
  Filled 2014-01-08: qty 1

## 2014-01-08 MED ORDER — ALUM & MAG HYDROXIDE-SIMETH 200-200-20 MG/5ML PO SUSP: 15.0000 mL | ORAL | Status: DC | PRN

## 2014-01-08 MED ORDER — DOCUSATE SODIUM 100 MG PO CAPS: 100.0000 mg | ORAL_CAPSULE | Freq: Every day | ORAL | Status: DC

## 2014-01-08 MED ORDER — OXYCODONE HCL 5 MG PO TABS: 5.0000 mg | ORAL_TABLET | ORAL | Status: DC | PRN

## 2014-01-08 MED ORDER — LABETALOL HCL 5 MG/ML IV SOLN
10.0000 mg | INTRAVENOUS | Status: DC | PRN
  Filled 2014-01-08: qty 4

## 2014-01-08 MED ORDER — PANTOPRAZOLE SODIUM 40 MG PO TBEC
40.0000 mg | DELAYED_RELEASE_TABLET | Freq: Every day | ORAL | Status: DC
  Filled 2014-01-08: qty 1

## 2014-01-08 MED ORDER — METOPROLOL TARTRATE 1 MG/ML IV SOLN: 2.0000 mg | INTRAVENOUS | Status: DC | PRN

## 2014-01-08 MED ORDER — LIDOCAINE HCL (PF) 1 % IJ SOLN
INTRAMUSCULAR | Status: AC
  Filled 2014-01-08: qty 30

## 2014-01-08 MED ORDER — TRAMADOL HCL 50 MG PO TABS: 100.0000 mg | ORAL_TABLET | Freq: Two times a day (BID) | ORAL | Status: DC | PRN

## 2014-01-08 MED ORDER — ACETAMINOPHEN 325 MG PO TABS: 325.0000 mg | ORAL_TABLET | ORAL | Status: DC | PRN

## 2014-01-08 MED ORDER — LINAGLIPTIN 5 MG PO TABS
5.0000 mg | ORAL_TABLET | Freq: Every day | ORAL | Status: DC
  Filled 2014-01-08: qty 1

## 2014-01-08 MED ORDER — ACETAMINOPHEN 650 MG RE SUPP: 325.0000 mg | RECTAL | Status: DC | PRN

## 2014-01-08 MED ORDER — ONDANSETRON HCL 4 MG/2ML IJ SOLN: 4.0000 mg | Freq: Four times a day (QID) | INTRAMUSCULAR | Status: DC | PRN

## 2014-01-08 MED ORDER — ALBUTEROL SULFATE (2.5 MG/3ML) 0.083% IN NEBU: 2.5000 mg | INHALATION_SOLUTION | Freq: Four times a day (QID) | RESPIRATORY_TRACT | Status: DC | PRN

## 2014-01-08 MED ORDER — HYDRALAZINE HCL 20 MG/ML IJ SOLN: 10.0000 mg | INTRAMUSCULAR | Status: DC | PRN

## 2014-01-08 MED ORDER — LABETALOL HCL 5 MG/ML IV SOLN
INTRAVENOUS | Status: AC
  Filled 2014-01-08: qty 4

## 2014-01-08 MED ORDER — BENAZEPRIL HCL 10 MG PO TABS
10.0000 mg | ORAL_TABLET | Freq: Every day | ORAL | Status: DC
  Administered 2014-01-08: 10 mg via ORAL
  Filled 2014-01-08: qty 1

## 2014-01-08 MED ORDER — METOPROLOL TARTRATE 50 MG PO TABS
50.0000 mg | ORAL_TABLET | Freq: Two times a day (BID) | ORAL | Status: DC
  Administered 2014-01-08: 50 mg via ORAL
  Filled 2014-01-08 (×2): qty 1

## 2014-01-08 MED ORDER — RENA-VITE PO TABS
1.0000 | ORAL_TABLET | Freq: Every day | ORAL | Status: DC
  Filled 2014-01-08: qty 1

## 2014-01-08 MED ORDER — MORPHINE SULFATE 2 MG/ML IJ SOLN: 2.0000 mg | INTRAMUSCULAR | Status: DC | PRN

## 2014-01-08 NOTE — Progress Notes (Signed)
Discharged to home with family office visits in place teaching done  

## 2014-01-08 NOTE — Progress Notes (Signed)
Patient ID: Dorothy FraiseCorine H Pfefferle, female   DOB: 02-22-1926, 80 y.o.   MRN: [id number] Comfortable following arteriogram. Discussed arteriogram findings with the patient her son and daughter present. She is having rest pain in her left foot. Arteriogram revealed she would require left femoral to posterior tibial bypass for improvement of flow. I discussed the magnitude of this procedure. She fortunately does not have any tissue loss but in all likelihood is going to have non-tolerable rest pain. He will be discharged today after her IV hydration. We will arrange cardiac preoperative evaluation with Dr. Katrinka BlazingSmith and office visit with me for vein mapping in several weeks. She'll notify should she develop worsening symptoms in the interim.

## 2014-01-08 NOTE — Op Note (Signed)
      [facility]           [facility] [place]   Procedure: Aortogram with left lower extremity runoff with carbon dioxide and contrast  Preoperative diagnosis: Rest pain left foot  Postoperative diagnosis: Same  Anesthesia Local  Operative details: After obtaining informed consent, the patient was taken to the PV LAB. The patient was placed in supine position on the Angio table. Both groins were prepped and draped in usual sterile fashion. Local anesthesia was infiltrated over the right common femoral artery. Initially, ultrasound was used to identify the common femoral artery.    An introducer needle was used to cannulate the right common femoral artery and 035 versacore wire threaded into the abdominal aorta under fluoroscopic guidance. Next a 5 French sheath is placed over the guidewire in the right common femoral artery. A 5 French pigtail catheter was placed over the guidewire into the abdominal aorta and abdominal aortogram was obtained using carbon dioxide as contrast material. The infrarenal abdominal aorta is patent. The left and right renal arteries are patent at their origin. The left and right common internal and external iliac arteries are patent. Next the pigtail catheter was pulled down just above the aortic bifurcation and oblique magnified views of the pelvis were obtained. This confirms the above findings with no significant aortoiliac inflow disease.   In order to get increased opacification of the tibials a 5 Fr crossover catheter was brought up on the field.  The crossover catheter was used to selectively catheterize the left common iliac artery and the guidewire advanced into the left distal external iliac artery. The crossover catheter was removed and replaced with a 5 French straight catheter. Angiogram was then performed the left lower extremity. An attempt was initially made to use carbon dioxide contrast for the left lower extremity; however the views of the  vessels were not optimal so iodinated contrast was used for the left lower extremity runoff. The left common femoral and profunda femoris arteries are patent. The left superficial femoral artery is patent at its origin however it is diffusely diseased and occludes at the adductor hiatus. The popliteal artery is occluded. The origin of the peroneal artery is patent however it is diffusely diseased and not a good graftable vessel. The posterior tibial artery does reconstitute via collaterals and is the single runoff vessel to the left foot.     Next the 5 French straight catheter was removed over a guidewire. The 5 Fr sheath was left in place to be pulled in the holding area. The patient tolerated the procedure well and there were no complications. Patient was taken to the holding area in stable condition.  Operative findings:  Severe popliteal and infrageniculate occlusive disease, occlusion popliteal artery, one vessel runoff posterior tibial artery left foot.  Total iodinated 55 cc   Operative management: Angiographic findings were discussed with Dr. early. The patient will undergo cardiac risk stratification and consideration given for tibial artery bypass.  Fabienne Brunsharles Fields, MD [facility] [place] Office: [phone] Pager: [phone]

## 2014-01-08 NOTE — Telephone Encounter (Signed)
01/08/14: lm for pt to call back for appointment information. I have scheduled cardiac clearance for 01/19/14 and follow up with [facility] on 02/16/14, dpm

## 2014-01-08 NOTE — Interval H&P Note (Signed)
History and Physical Interval Note:  01/08/2014 8:36 AM  Dorothy Bartlett  has presented today for surgery, with the diagnosis of Left foot rest pain  The various methods of treatment have been discussed with the patient and family. After consideration of risks, benefits and other options for treatment, the patient has consented to  Procedure(s): LOWER EXTREMITY ANGIOGRAM (Left) as a surgical intervention .  The patient's history has been reviewed, patient examined, no change in status, stable for surgery.  I have reviewed the patient's chart and labs.  Questions were answered to the patient's satisfaction.     Mishawn Didion E

## 2014-01-08 NOTE — Telephone Encounter (Signed)
Per v.o. Dr. Arbie CookeyEarly, schedule pt. For Pre-op Cardiac Clearance for Left Femoral-Tibial Bypass, with Dr. Garnette ScheuermannHank Bartlett, and then appt. for bilateral vein mapping and office appt with Dr. Arbie CookeyEarly in several weeks.

## 2014-01-08 NOTE — H&P (View-Only) (Signed)
   Patient name: Dorothy Bartlett MRN: [id number] DOB: 02/12/1926 Sex: female   Referred by: [place]  Reason for referral:  Chief Complaint  Patient presents with  . New Evaluation    c/o leg pain bilateral leg pain (throbbing, achy) ,  L>R  , swelling bilateral ankles and feet            HISTORY OF PRESENT ILLNESS: The patient is a very pleasant 80-year-old female who presents today with bilateral lower extremity pain. This is much worse on the left leg than the right leg. She has other pain and has been seen in the emergency department for treatment. She has been taking Ultram every 6 hours with some relief. She reports pain specifically in her left foot extending up onto her calf. She does walk but has difficulty and pain associated with this as well. She did have a possible fungal infection between the web spaces of her toes on the left but this is cleared with over-the-counter Lotrimin. She does have known renal insufficiency and does have a history of coronary artery disease with prior myocardial infarction and coronary angioplasty and stenting.  Past Medical History  Diagnosis Date  . Diabetes mellitus without complication   . Hypertension   . Renal disorder   . MI (myocardial infarction)   . Asthma     Past Surgical History  Procedure Laterality Date  . Cardiac catheterization    . Repair of nerve in left arm  Left 2000    History   Social History  . Marital Status: Widowed    Spouse Name: N/A    Number of Children: N/A  . Years of Education: N/A   Occupational History  . Not on file.   Social History Main Topics  . Smoking status: Never Smoker   . Smokeless tobacco: Never Used  . Alcohol Use: No  . Drug Use: No  . Sexual Activity: Not on file   Other Topics Concern  . Not on file   Social History Narrative  . No narrative on file    Family History  Problem Relation Age of Onset  . Diabetes Father   . Hypertension Father   . Hypertension Daughter     . Hyperlipidemia Daughter   . Hyperlipidemia Son     Allergies as of 01/05/2014  . (No Known Allergies)    Current Outpatient Prescriptions on File Prior to Visit  Medication Sig Dispense Refill  . amLODipine (NORVASC) 10 MG tablet Take 10 mg by mouth daily.      . clindamycin (CLEOCIN) 300 MG capsule Take 300 mg by mouth 3 (three) times daily.       . folic acid-vitamin b complex-vitamin c-selenium-zinc (DIALYVITE) 3 MG TABS tablet Take 1 tablet by mouth daily.      . furosemide (LASIX) 80 MG tablet Take 40 mg by mouth daily.      . JANUVIA 25 MG tablet Take 25 mg by mouth daily.       . metoprolol (LOPRESSOR) 50 MG tablet Take 50 mg by mouth 2 (two) times daily.       . paricalcitol (ZEMPLAR) 1 MCG capsule Take 1 mcg by mouth daily.       . traMADol (ULTRAM) 50 MG tablet Take 1 tablet (50 mg total) by mouth every 6 (six) hours as needed.  15 tablet  0  . benazepril (LOTENSIN) 10 MG tablet Take 10 mg by mouth daily.       .   fluconazole (DIFLUCAN) 150 MG tablet        No current facility-administered medications on file prior to visit.     REVIEW OF SYSTEMS:  Positives indicated with an "X"  CARDIOVASCULAR:  [ ] chest pain   [ ] chest pressure   [ ] palpitations   [ ] orthopnea   [ ] dyspnea on exertion   [ ] claudication   [x ] rest pain   [ ] DVT   [ ] phlebitis PULMONARY:   [ ] productive cough   [x ] asthma   [x ] wheezing NEUROLOGIC:   [ ] weakness  [ ] paresthesias  [ ] aphasia  [ ] amaurosis  [ ] dizziness HEMATOLOGIC:   [ ] bleeding problems   [ ] clotting disorders MUSCULOSKELETAL:  [ ] joint pain   [ ] joint swelling GASTROINTESTINAL: [ ]  blood in stool  [ ]  hematemesis GENITOURINARY:  [ ]  dysuria  [ ]  hematuria PSYCHIATRIC:  [ ] history of major depression INTEGUMENTARY:  [ ] rashes  [ ] ulcers CONSTITUTIONAL:  [ ] fever   [ ] chills  PHYSICAL EXAMINATION:  General: The patient is a well-nourished female, in no acute distress. Vital signs are BP 187/78   Pulse 59  Resp 16  Ht 4' 9" (1.448 m)  Wt 203 lb 11.2 oz (92.398 kg)  BMI 44.07 kg/m2 Pulmonary: There is a good air exchange bilaterally without wheezing or rales. Abdomen: Soft and non-tender with normal pitch bowel sounds. Musculoskeletal: There are no major deformities.  She does have dependent rubor in her left foot. Neurologic: No focal weakness or paresthesias are detected, Skin: There are no ulcer or rashes noted. Psychiatric: The patient has normal affect. Cardiovascular: There is a regular rate and rhythm without significant murmur appreciated. Pulse status: 2+ radial pulses bilaterally 2+ femoral pulses bilaterally she has a palpable right popliteal pulse absent right pedal pulses. On the left there is absent popliteal and pedal pulses Carotid arteries without bruits bilaterally  [facility] Vascular Lab Studies:  Ordered and Independently Reviewed monophasic tibial waveforms bilaterally. Ankle arm indices not reliable do to calcific disease. Absent digital waveforms on left and 10 on the right  Impression and Plan:  Bilateral lower extremity arterial insufficiency left greater than right. I do feel that she has severe ischemia in her left foot with limb threatening ischemia. She does have a known renal insufficiency with a baseline creatinine of 1.8. I explained the need for arteriography for further evaluation and possible treatment. She will be admitted to the hospital on 1/8 for IV hydration and arteriogram and possible angioplasty with limited contrast on 1/9. If she has treatment option for angioplasty this will be undertaken at that time. If not we will discuss possible bypass. If she requires surgery we would have her see Dr. Smith for cardiology clearance   Harmoni Lucus [facility] [place] Office: [phone]         

## 2014-01-08 NOTE — Interval H&P Note (Signed)
History and Physical Interval Note:  01/08/2014 9:00 AM  Dorothy Bartlett  has presented today for surgery, with the diagnosis of Left foot rest pain  The various methods of treatment have been discussed with the patient and family. After consideration of risks, benefits and other options for treatment, the patient has consented to  Procedure(s): LOWER EXTREMITY ANGIOGRAM (Left) as a surgical intervention .  Discussed with patient high risk of contrast nephropathy since her baseline creatinine is 2.   Also discussed with pt possible permanent renal failure and dialysis.  She wishes to proceed.  The patient's history has been reviewed, patient examined, no change in status, stable for surgery.  I have reviewed the patient's chart and labs.  Questions were answered to the patient's satisfaction.     Srihari Shellhammer E

## 2014-01-15 ENCOUNTER — Encounter: Payer: Self-pay | Admitting: Physician Assistant

## 2014-01-15 ENCOUNTER — Ambulatory Visit (INDEPENDENT_AMBULATORY_CARE_PROVIDER_SITE_OTHER): Payer: Medicare Other | Admitting: Physician Assistant

## 2014-01-15 VITALS — BP 188/77 | HR 60 | Ht <= 58 in | Wt 204.0 lb

## 2014-01-15 DIAGNOSIS — N183 Chronic kidney disease, stage 3 unspecified: Secondary | ICD-10-CM

## 2014-01-15 DIAGNOSIS — I5032 Chronic diastolic (congestive) heart failure: Secondary | ICD-10-CM

## 2014-01-15 DIAGNOSIS — R0602 Shortness of breath: Secondary | ICD-10-CM

## 2014-01-15 DIAGNOSIS — I2510 Atherosclerotic heart disease of native coronary artery without angina pectoris: Secondary | ICD-10-CM

## 2014-01-15 DIAGNOSIS — Z01810 Encounter for preprocedural cardiovascular examination: Secondary | ICD-10-CM

## 2014-01-15 DIAGNOSIS — N185 Chronic kidney disease, stage 5: Secondary | ICD-10-CM | POA: Insufficient documentation

## 2014-01-15 DIAGNOSIS — I10 Essential (primary) hypertension: Secondary | ICD-10-CM

## 2014-01-15 DIAGNOSIS — E785 Hyperlipidemia, unspecified: Secondary | ICD-10-CM

## 2014-01-15 MED ORDER — CARVEDILOL 6.25 MG PO TABS: 6.2500 mg | ORAL_TABLET | Freq: Two times a day (BID) | ORAL | Status: DC

## 2014-01-15 NOTE — Patient Instructions (Addendum)
Check with your pharmacy and your primary care provider about your cholesterol medication. You used to take Pravastatin 80 mg daily.  STOP METOPROLOL  START COREG 6.25 MG TWICE DAILY. AN RX HAS BEEN SENT TO YOUR PHARMACY  Your physician has requested that you have a lexiscan myoview. For further information please visit [email or web address]. Please follow instruction sheet, as given.  Your physician recommends that you schedule a follow-up appointment in: 1-2 WEEKS WITH DR.SMITH/OR S.Laylaa Guevarra, PA A DAY THAT DR.SMITH IS IN THE OFFICE

## 2014-01-15 NOTE — Progress Notes (Signed)
[address] [place], [place]  [address] Phone: [phone] Fax:  [phone]  Date:  01/15/2014   ID:  Dorothy Bartlett, DOB 06/22/1926, MRN [id number]  PCP:  Ralene Ok, MD  Cardiologist:  Dr. Verdis Prime     History of Present Illness: Dorothy Bartlett is a 80 y.o. female with a history of CAD, status post prior myocardial infarction in 1995 treated with angioplasty of the diagonal, ramus and apical LAD, diastolic CHF, HTN, CKD, RBBB, O9GE, carotid stenosis, PAD.  LHC (11/2004):  Inferior HK, EF 60%, proximal LAD 50%, ostial D1 80%, mid D1 95%, mid LAD 50-60%, distal LAD at the apex 90%, OM1 occluded (fills late by left to left collaterals), RCA 75%. Medical therapy recommended.  Echocardiogram (04/2005):EF 55-65%, moderate LVH, mild aortic stenosis (mean gradient 9), moderate LAE.  Carotid US (12/2013): < 60% RCA, < 40% LICA.  Patient was recently evaluated for left foot pain. She has significant arterial insufficiency in her left leg. The recommendation is to proceed with tibial artery bypass surgery. She presents for cardiac clearance.  Her activity is limited by left lower extremity claudication. She does describe dyspnea with exertion. This is fairly chronic and stable. She describes NYHA class 2b-3 symptoms. She denies any chest discomfort. She does feel tired with activity. She denies orthopnea or PND. Pedal edema is mild and stable. She denies syncope   Recent Labs: 01/07/2014: Hemoglobin 12.7  01/08/2014: Creatinine 2.76*; Potassium 4.2   Wt Readings from Last 3 Encounters:  01/15/14 204 lb (92.534 kg)  01/07/14 202 lb 8 oz (91.853 kg)  01/07/14 202 lb 8 oz (91.853 kg)     Past Medical History  Diagnosis Date  . Diabetes mellitus without complication   . Hypertension   . MI (myocardial infarction)   . Asthma   . Coronary artery disease     a. s/p MI 1995 tx w/ PTCA to Dx, Ramus, ap LAD;  b. LHC (11/2004):  Inferior HK, EF 60%, proximal LAD 50%, ostial D1 80%, mid  D1 95%, mid LAD 50-60%, distal LAD at the apex 90%, OM1 occluded (fills late by left to left collaterals), RCA 75%. Medical therapy recommended.    [name] Hx of echocardiogram     a. Echocardiogram (04/2005):EF 55-65%, moderate LVH, mild aortic stenosis (mean gradient 9), moderate LAE.  [name] Chronic diastolic CHF (congestive heart failure)   . Hyperlipidemia   . CKD (chronic kidney disease) stage 3, GFR 30-59 ml/min     Dr. Hyman Hopes  . PAD (peripheral artery disease)   . Carotid stenosis     Carotid US (12/2013): < 60% RCA, < 40% LICA.    Current Outpatient Prescriptions  Medication Sig Dispense Refill  . albuterol (PROVENTIL HFA;VENTOLIN HFA) 108 (90 BASE) MCG/ACT inhaler Inhale 2 puffs into the lungs every 6 (six) hours as needed for wheezing or shortness of breath.      [name] amLODipine (NORVASC) 10 MG tablet Take 10 mg by mouth daily.      [name] aspirin 81 MG tablet Take 81 mg by mouth daily.      . benazepril (LOTENSIN) 10 MG tablet Take 10 mg by mouth daily.       . folic acid-vitamin b complex-vitamin c-selenium-zinc (DIALYVITE) 3 MG TABS tablet Take 1 tablet by mouth daily.      . furosemide (LASIX) 80 MG tablet Take 40 mg by mouth daily.      [name] JANUVIA 25 MG tablet Take 25 mg by  mouth daily.       . metoprolol (LOPRESSOR) 50 MG tablet Take 50 mg by mouth 2 (two) times daily.       . paricalcitol (ZEMPLAR) 1 MCG capsule Take 1 mcg by mouth daily.       . traMADol (ULTRAM) 50 MG tablet Take 50 mg by mouth every 6 (six) hours as needed for moderate pain.       No current facility-administered medications for this visit.    Allergies:   Review of patient's allergies indicates no known allergies.   Social History:  The patient  reports that she has never smoked. She has never used smokeless tobacco. She reports that she does not drink alcohol or use illicit drugs.   Family History:  The patient's family history includes Diabetes in her father; Hyperlipidemia in her daughter and son; Hypertension in her  daughter and father.   ROS:  Please see the history of present illness.   No cough, fevers, bleeding problems.   All other systems reviewed and negative.   PHYSICAL EXAM: VS:  BP 188/77  Pulse 60  Ht 4\' 9"  (1.448 m)  Wt 204 lb (92.534 kg)  BMI 44.13 kg/m2 Well nourished, well developed, in no acute distress HEENT: normal Neck: no JVDat 90 Cardiac:  normal S1, S2; RRR; no murmur Lungs:  Decreased breath sounds bilaterally, no wheezing, rhonchi or rales Abd: soft, nontender, no hepatomegaly Ext: trace bilateral LE edema Skin: warm and dry Neuro:  CNs 2-12 intact, no focal abnormalities noted  EKG:  NSR, HR 60, LAD, RBBB, lateral Q waves, nonspecific ST-T wave changes, no significant change when compared to prior tracings     ASSESSMENT AND PLAN:  1. Surgical Clearance:  She needs vascular surgery which would carry the highest risk for cardiovascular complications.  She is unable to achieve 4 METs due to limitations from her PAD and medical co-morbidities.  She has not had an assessment for ischemia since 2005.  Given her CAD, CHF, CKD and HTN, she will likely be moderate to high risk for cardiovascular complications during surgery.  I will arrange a Steffanie DunnLexiscan myoview for risk stratification.  She will be brought back in close follow up after her stress test to make final recommendations. 2. CAD:  She denies angina.  Proceed with myoview as noted.  Her study will likely be abnormal.  We are mainly looking for high risk features that would make her perioperative risk prohibitive.  Continue ASA, beta blocker, statin. 3. Chronic Diastolic CHF:  Volume appears stable.  She is chronically short of breath.  Dyspnea is likely multifactorial and related to CHF, CAD, asthma, obesity, and deconditioning.  Continue current Rx.  4. Hypertension:  Uncontrolled.  D/c Metoprolol.  Start Coreg 6.25 mg BID.  This may need to be increased to 12.5 mg BID depending on BP response.   5. Hyperlipidemia:  She is  not sure why Pravastatin is no longer on her list.  She will check into this with her PCP and pharmacy. 6. Chronic Kidney Disease:  Continue follow up with Dr. Hyman HopesWebb.  7. Disposition:  Follow up with Dr. Verdis PrimeHenry Smith or me in 1-2 weeks.   Signed, Tereso NewcomerScott Naimah Yingst, PA-C  01/15/2014 11:07 AM

## 2014-01-18 ENCOUNTER — Other Ambulatory Visit: Payer: Self-pay | Admitting: Vascular Surgery

## 2014-01-18 DIAGNOSIS — Z01810 Encounter for preprocedural cardiovascular examination: Secondary | ICD-10-CM

## 2014-01-18 DIAGNOSIS — I739 Peripheral vascular disease, unspecified: Secondary | ICD-10-CM

## 2014-01-19 ENCOUNTER — Ambulatory Visit: Payer: Medicare Other | Admitting: Physician Assistant

## 2014-01-28 ENCOUNTER — Ambulatory Visit (HOSPITAL_COMMUNITY): Payer: Medicare Other | Attending: Cardiology | Admitting: Radiology

## 2014-01-28 VITALS — BP 92/65 | HR 68 | Ht <= 58 in | Wt 204.0 lb

## 2014-01-28 DIAGNOSIS — I2510 Atherosclerotic heart disease of native coronary artery without angina pectoris: Secondary | ICD-10-CM | POA: Insufficient documentation

## 2014-01-28 DIAGNOSIS — R0602 Shortness of breath: Secondary | ICD-10-CM

## 2014-01-28 DIAGNOSIS — Z01818 Encounter for other preprocedural examination: Secondary | ICD-10-CM | POA: Insufficient documentation

## 2014-01-28 DIAGNOSIS — Z01810 Encounter for preprocedural cardiovascular examination: Secondary | ICD-10-CM | POA: Insufficient documentation

## 2014-01-28 MED ORDER — TECHNETIUM TC 99M SESTAMIBI GENERIC - CARDIOLITE
10.8000 | Freq: Once | INTRAVENOUS | Status: AC | PRN
  Administered 2014-01-28: 11 via INTRAVENOUS

## 2014-01-28 MED ORDER — TECHNETIUM TC 99M SESTAMIBI GENERIC - CARDIOLITE
33.0000 | Freq: Once | INTRAVENOUS | Status: AC | PRN
  Administered 2014-01-28: 33 via INTRAVENOUS

## 2014-01-28 MED ORDER — REGADENOSON 0.4 MG/5ML IV SOLN
0.4000 mg | Freq: Once | INTRAVENOUS | Status: AC
  Administered 2014-01-28: 0.4 mg via INTRAVENOUS

## 2014-01-28 NOTE — Progress Notes (Signed)
[facility] SITE 3 NUCLEAR MED [address] [place], [place] [id number]    Cardiology Nuclear Med Study  Dorothy Rushie ChestnutH Bartlett is a 80 y.o. female     MRN : [id number]     DOB: [birth year]  Procedure Date: 01/28/2014  Nuclear Med Background Indication for Stress Test:  Evaluation for Ischemia, Surgical Clearance (Tibial Artery Bypass- Dr. Fabienne Brunsharles Fields) and PTCA Patency History:  CAD, MI, Cath, PTCA (multi), Echo 2006 EF 55-65%, Asthma, CHF Cardiac Risk Factors: Carotid Disease, Hypertension, Lipids, NIDDM, PVD, RBBB and TIA  Symptoms:  DOE and Fatigue   Nuclear Pre-Procedure Caffeine/Decaff Intake:  None NPO After: 8:00pm   Lungs:  clear O2 Sat: 97% on room air. IV 0.9% NS with Angio Cath:  24g  IV Site: R Hand  IV Started by:  Bonnita LevanJackie Smith, RN  Chest Size (in):  44 Cup Size: D  Height: 4\' 9"  (1.448 m)  Weight:  204 lb (92.534 kg)  BMI:  Body mass index is 44.13 kg/(m^2). Tech Comments:  N/A    Nuclear Med Study 1 or 2 day study: 1 day  Stress Test Type:  Lexiscan  Reading MD: N/A  Order Authorizing Provider:  Verdis PrimeHenry Smith, MD  Resting Radionuclide: Technetium 7217m Sestamibi  Resting Radionuclide Dose: 10.8 mCi   Stress Radionuclide:  Technetium 4017m Sestamibi  Stress Radionuclide Dose: 33.0 mCi           Stress Protocol Rest HR: 68 Stress HR: 77  Rest BP: 92/65 Stress BP: 158/64  Exercise Time (min): n/a METS: n/a           Dose of Adenosine (mg):  n/a Dose of Lexiscan: 0.4 mg  Dose of Atropine (mg): n/a Dose of Dobutamine: n/a mcg/kg/min (at max HR)  Stress Test Technologist: Nelson ChimesSharon Brooks, BS-ES  Nuclear Technologist:  Domenic PoliteStephen Carbone, CNMT     Rest Procedure:  Myocardial perfusion imaging was performed at rest 45 minutes following the intravenous administration of Technetium 6817m Sestamibi. Rest ECG: NSR, RBBB  Stress Procedure:  The patient received IV Lexiscan 0.4 mg over 15-seconds.  Technetium 4617m Sestamibi injected at 30-seconds.   Quantitative spect images were obtained after a 45 minute delay. Stress ECG: No significant change from baseline ECG  QPS Raw Data Images:  Normal; no motion artifact; normal heart/lung ratio. Stress Images:  There is a large severe defect of the mid-distal anterior wall and a large severe defect of the entire lateral wall.   Rest Images:  There is a large moderate- severe defect of the mid-distal anterior wall and a large severe defect of the entire lateral wall.  Subtraction (SDS):  There is evidence of a large anterior lateral MI with a very small amount of peri-infarct ischemia of the mid anterior wall.   Transient Ischemic Dilatation (Normal <1.22):  0.95 Lung/Heart Ratio (Normal <0.45):  0.32  Quantitative Gated Spect Images QGS EDV:  110 ml QGS ESV:  66 ml  Impression Exercise Capacity:  Lexiscan with no exercise. BP Response:  Normal blood pressure response. Clinical Symptoms:  No significant symptoms noted. ECG Impression:  No significant ST segment change suggestive of ischemia. Comparison with Prior Nuclear Study: No images to compare  Overall Impression:  High risk stress nuclear study .  There is evidence of a previous anterior lateral MI with a very small area of peri-infarct ischemia in the anterior wall.  .    This defect is consistent with her history of known CAD and PTCA in  the past.   LV Ejection Fraction: 40%.  LV Wall Motion:  The LV function is moderately depressed.  There is global hypokinesis with severe hypokinesis of the lateral wall.     Dorothy Bartlett, Dorothy Bartlett., MD, [facility] 01/28/2014, 5:18 PM Office - [phone] Pager [phone]

## 2014-01-29 ENCOUNTER — Encounter: Payer: Self-pay | Admitting: Physician Assistant

## 2014-01-29 NOTE — Discharge Summary (Signed)
[facility] Discharge Summary   Patient ID:  Dorothy Bartlett MRN: [id number] DOB/AGE: 1926-12-30 80 y.o.  Admit date: 01/07/2014 Discharge date: 01/29/2014 Date of Surgery: 01/07/2014 - 01/08/2014 Surgeon: Moishe Spice): Sherren Kerns, MD  Admission Diagnosis: lt foot rest pain renal insuff Left foot rest pain  Discharge Diagnoses:  lt foot rest pain renal insuff Left foot rest pain  Secondary Diagnoses: Past Medical History  Diagnosis Date  . Diabetes mellitus without complication   . Hypertension   . MI (myocardial infarction)   . Asthma   . Coronary artery disease     a. s/p MI 1995 tx w/ PTCA to Dx, Ramus, ap LAD;  b. LHC (11/2004):  Inferior HK, EF 60%, proximal LAD 50%, ostial D1 80%, mid D1 95%, mid LAD 50-60%, distal LAD at the apex 90%, OM1 occluded (fills late by left to left collaterals), RCA 75%. Medical therapy recommended.;  c. Lexiscan Myoview (12/2013):  Prior ant-lat scar with very small area of peri-infarct ischemia in ant wall, EF 40 (high risk)   . Hx of echocardiogram     a. Echocardiogram (04/2005):EF 55-65%, moderate LVH, mild aortic stenosis (mean gradient 9), moderate LAE.  [name] Chronic diastolic CHF (congestive heart failure)   . Hyperlipidemia   . CKD (chronic kidney disease) stage 3, GFR 30-59 ml/min     Dr. Hyman Hopes  . PAD (peripheral artery disease)   . Carotid stenosis     Carotid US (12/2013): < 60% RCA, < 40% LICA.    Procedure(s): LOWER EXTREMITY ANGIOGRAM ABDOMINAL ANGIOGRAM  Discharged Condition: good  HPI: The patient is a very pleasant 80 year old female who presents today with bilateral lower extremity pain.  She came to [facility] and underwent Aortogram with left lower extremity runoff with carbon dioxide and contrast.  Operative findings: Severe popliteal and infrageniculate occlusive disease, occlusion popliteal artery, one vessel runoff posterior tibial artery left foot. Total iodinated 55 cc.  Angiographic findings were  discussed with Dr. Naevia Unterreiner. The patient will undergo cardiac risk stratification and consideration given for tibial artery bypass.  The patient was set up for For Pre-op Cardiac Clearance for Left Femoral-Tibial Bypass, with Dr. Garnette Scheuermann, and then appt. for bilateral vein mapping and office appt with Dr. Arbie Cookey in several weeks. The patient was discharged home the same day.        Hospital Course:  Dorothy Bartlett is a 80 y.o. female is S/P Right Procedure(s): LOWER EXTREMITY ANGIOGRAM ABDOMINAL ANGIOGRAM Extubated: POD # 0 Physical exam: Pulse status: 2+ radial pulses bilaterally 2+ femoral pulses bilaterally she has a palpable right popliteal pulse absent right pedal pulses. On the left there is absent popliteal and pedal pulses  Post-op wounds healing well Pt. Ambulating, voiding and taking PO diet without difficulty. Pt pain controlled with PO pain meds. Labs as below Complications:none  Consults:     Significant Diagnostic Studies: CBC Lab Results  Component Value Date   WBC 10.3 01/07/2014   HGB 12.7 01/07/2014   HCT 38.4 01/07/2014   MCV 90.8 01/07/2014   PLT 273 01/07/2014    BMET    Component Value Date/Time   NA 141 01/08/2014 0550   K 4.2 01/08/2014 0550   CL 103 01/08/2014 0550   CO2 23 01/08/2014 0550   GLUCOSE 242* 01/08/2014 0550   BUN 34* 01/08/2014 0550   CREATININE 2.76* 01/08/2014 0550   CALCIUM 9.3 01/08/2014 0550   GFRNONAA 15* 01/08/2014 0550   GFRAA 17* 01/08/2014 0550  COAG Lab Results  Component Value Date   INR 0.92 01/07/2014     Disposition:  Discharge to :Home  Future Appointments Provider Department Dept Phone   02/04/2014 11:45 AM Lesleigh NoeHenry W Smith III, MD [facility] [address] [phone]   02/16/2014 1:00 PM [facility] [address] [phone]   02/16/2014 1:30 PM Larina Earthlyodd F Jeyson Deshotel, MD [facility] [phone]   07/06/2014 9:30 AM Lesleigh NoeHenry W Smith III, MD [facility] [address] [phone]        Medication List         albuterol 108 (90 BASE) MCG/ACT inhaler  Commonly known as:  PROVENTIL HFA;VENTOLIN HFA  Inhale 2 puffs into the lungs every 6 (six) hours as needed for wheezing or shortness of breath.     amLODipine 10 MG tablet  Commonly known as:  NORVASC  Take 10 mg by mouth daily.     aspirin 81 MG tablet  Take 81 mg by mouth daily.     benazepril 10 MG tablet  Commonly known as:  LOTENSIN  Take 10 mg by mouth daily.     folic acid-vitamin b complex-vitamin c-selenium-zinc 3 MG Tabs tablet  Take 1 tablet by mouth daily.     furosemide 80 MG tablet  Commonly known as:  LASIX  Take 40 mg by mouth daily.     JANUVIA 25 MG tablet  Generic drug:  sitaGLIPtin  Take 25 mg by mouth daily.     paricalcitol 1 MCG capsule  Commonly known as:  ZEMPLAR  Take 1 mcg by mouth daily.     traMADol 50 MG tablet  Commonly known as:  ULTRAM  Take 50 mg by mouth every 6 (six) hours as needed for moderate pain.       Verbal and written Discharge instructions given to the patient. Wound care per Discharge AVS   Signed: Rondy Krupinski 01/29/2014, 2:01 PM

## 2014-02-02 ENCOUNTER — Telehealth: Payer: Self-pay | Admitting: *Deleted

## 2014-02-02 NOTE — Telephone Encounter (Signed)
pt notified about myoview and the need for echo due to EF lower. Advised may try to get echo when she Dr. Katrinka BlazingSmith 2/5. Pt verbalized Plan of Care.

## 2014-02-04 ENCOUNTER — Ambulatory Visit (INDEPENDENT_AMBULATORY_CARE_PROVIDER_SITE_OTHER): Payer: Medicare Other | Admitting: Interventional Cardiology

## 2014-02-04 ENCOUNTER — Encounter: Payer: Self-pay | Admitting: Interventional Cardiology

## 2014-02-04 VITALS — BP 166/84 | HR 64 | Ht <= 58 in | Wt 202.0 lb

## 2014-02-04 DIAGNOSIS — Z01810 Encounter for preprocedural cardiovascular examination: Secondary | ICD-10-CM

## 2014-02-04 DIAGNOSIS — E785 Hyperlipidemia, unspecified: Secondary | ICD-10-CM

## 2014-02-04 DIAGNOSIS — I5032 Chronic diastolic (congestive) heart failure: Secondary | ICD-10-CM

## 2014-02-04 DIAGNOSIS — N183 Chronic kidney disease, stage 3 unspecified: Secondary | ICD-10-CM

## 2014-02-04 DIAGNOSIS — I2510 Atherosclerotic heart disease of native coronary artery without angina pectoris: Secondary | ICD-10-CM

## 2014-02-04 DIAGNOSIS — I10 Essential (primary) hypertension: Secondary | ICD-10-CM

## 2014-02-04 DIAGNOSIS — I359 Nonrheumatic aortic valve disorder, unspecified: Secondary | ICD-10-CM

## 2014-02-04 MED ORDER — CARVEDILOL 12.5 MG PO TABS: 12.5000 mg | ORAL_TABLET | Freq: Two times a day (BID) | ORAL | Status: DC

## 2014-02-04 NOTE — Progress Notes (Signed)
Patient ID: Dorothy Bartlett, female   DOB: 14-Sep-1926, 80 y.o.   MRN: [id number]    [address] [place], [place]  [address] Phone: [phone] Fax:  [phone]  Date:  02/04/2014   ID:  Dorothy Bartlett, DOB 14-Sep-1926, MRN [id number]  PCP:  Ralene OkMOREIRA,ROY, MD   ASSESSMENT:  1. Preoperative cardiovascular clearance for vascular surgery 2. Coronary artery disease with high risk nuclear study 3. Aortic stenosis 4. Chronic combined systolic and diastolic heart failure 5. Peripheral vascular disease with rest pain in the left foot and lower leg 6. Diabetes, poorly controlled 7. Hypertension, better controlled on augmented medical regimen  PLAN:  1. 2 Doppler echocardiogram to assess severity of aortic stenosis (if critical, my opinion concerning surgical clearance may change) 2. Cleared for upcoming vascular surgery but will be high risk, i.e. greater than 5% risk of acute cardiac complications. She has no decompensated cardiopulmonary complaints (i.e. angina, heart failure, syncope, palpitations, etc.). I have discussed this with the patient and daughter who understand the severity of her underlying generalized vascular condition. 3. Continue the current medical regimen, but will increase carvedilol to 12.5 mg twice a day to optimize cardioprotection   SUBJECTIVE: Dorothy Bartlett Franki MonteDeese is a 80 y.o. female who is having left foot pain at rest related to arterial insufficiency. She has undergone arteriography by Dr.Early and needs to have surgery . She was referred here for cardiac clearance prior to vascular surgery. The patient has a long-standing history of severe coronary disease with PCI performed in 1995 and evidence of-use coronary disease noted by catheterization in 2005 including 50% LAD 80% diagonal 195% diagonal to 60% mid LAD 90% apical LAD occluded OM1 and 75% right coronary. Medical therapy was chosen as the treatment of choice. She currently has no specific anginal or heart  failure complaints. A myocardial perfusion study done with LexiScan pharmacologic stress in January 2015 revealed evidence of multi-on perfusion abnormality compatible with known coronary disease. EF was 40%. The scan was deemed to be a high risk scan.  On exam there is a systolic murmur compatible with aortic stenosis. When last evaluated by echocardiography in 2006 the left ear was felt to be mild. Again she has not had syncope, angina, or heart failure.  Wt Readings from Last 3 Encounters:  02/04/14 202 lb (91.627 kg)  01/28/14 204 lb (92.534 kg)  01/15/14 204 lb (92.534 kg)     Past Medical History  Diagnosis Date  . Diabetes mellitus without complication   . Hypertension   . MI (myocardial infarction)   . Asthma   . Coronary artery disease     a. s/p MI 1995 tx w/ PTCA to Dx, Ramus, ap LAD;  b. LHC (11/2004):  Inferior HK, EF 60%, proximal LAD 50%, ostial D1 80%, mid D1 95%, mid LAD 50-60%, distal LAD at the apex 90%, OM1 occluded (fills late by left to left collaterals), RCA 75%. Medical therapy recommended.;  c. Lexiscan Myoview (12/2013):  Prior ant-lat scar with very small area of peri-infarct ischemia in ant wall, EF 40 (high risk)   . Hx of echocardiogram     a. Echocardiogram (04/2005):EF 55-65%, moderate LVH, mild aortic stenosis (mean gradient 9), moderate LAE.  [name]. Chronic diastolic CHF (congestive heart failure)   . Hyperlipidemia   . CKD (chronic kidney disease) stage 3, GFR 30-59 ml/min     Dr. Hyman HopesWebb  . PAD (peripheral artery disease)   . Carotid stenosis  Carotid US (12/2013): < 60% RCA, < 40% LICA.    Current Outpatient Prescriptions  Medication Sig Dispense Refill  . albuterol (PROVENTIL HFA;VENTOLIN HFA) 108 (90 BASE) MCG/ACT inhaler Inhale 2 puffs into the lungs every 6 (six) hours as needed for wheezing or shortness of breath.      [name] amLODipine (NORVASC) 10 MG tablet Take 10 mg by mouth daily.      [name] aspirin 81 MG tablet Take 81 mg by mouth daily.      .  benazepril (LOTENSIN) 10 MG tablet Take 10 mg by mouth daily.       . carvedilol (COREG) 6.25 MG tablet Take 1 tablet (6.25 mg total) by mouth 2 (two) times daily.  180 tablet  3  . folic acid-vitamin b complex-vitamin c-selenium-zinc (DIALYVITE) 3 MG TABS tablet Take 1 tablet by mouth daily.      . furosemide (LASIX) 80 MG tablet Take 40 mg by mouth daily.      [name] JANUVIA 25 MG tablet Take 25 mg by mouth daily.       [name] DELICA LANCETS 33G MISC       . ONETOUCH VERIO test strip       . paricalcitol (ZEMPLAR) 1 MCG capsule Take 1 mcg by mouth daily.       . traMADol (ULTRAM) 50 MG tablet Take 50 mg by mouth every 6 (six) hours as needed for moderate pain.       No current facility-administered medications for this visit.    Allergies:   No Known Allergies  Social History:  The patient  reports that she has never smoked. She has never used smokeless tobacco. She reports that she does not drink alcohol or use illicit drugs.   ROS:  Please see the history of present illness.   Denies orthopnea, PND, edema, palpitations, and syncope.   All other systems reviewed and negative.   OBJECTIVE: VS:  BP 166/84  Pulse 64  Ht 4\' 9"  (1.448 m)  Wt 202 lb (91.627 kg)  BMI 43.70 kg/m2  SpO2 99% Well nourished, well developed, in no acute distress, appears than her stated age HEENT: normal Neck: JVD flat. Carotid bruit absent  Cardiac:  normal S1, S2; RRR; 2-3/6 crescendo decrescendo murmur of aortic stenosis. No diastolic murmurs heard. Lungs:  clear to auscultation bilaterally, no wheezing, rhonchi or rales Abd: soft, nontender, no hepatomegaly Ext: Edema trace bilateral ankle edema. Pulses difficult to palpate a left and right lower extremity Skin: warm and dry Neuro:  CNs 2-12 intact, no focal abnormalities noted  EKG:   Not repeated       Signed, Darci Needle III, MD 02/04/2014 11:51 AM

## 2014-02-04 NOTE — Patient Instructions (Signed)
Your physician has recommended you make the following change in your medication:  1)Increase Carvedilol to 12.5 mg twice daily  Take all other medication as prescribed  Your physician has requested that you have an echocardiogram. Echocardiography is a painless test that uses sound waves to create images of your heart. It provides your doctor with information about the size and shape of your heart and how well your heart's chambers and valves are working. This procedure takes approximately one hour. There are no restrictions for this procedure.   Your physician wants you to follow-up in: 6 months You will receive a reminder letter in the mail two months in advance. If you don't receive a letter, please call our office to schedule the follow-up appointment.

## 2014-02-11 ENCOUNTER — Ambulatory Visit (HOSPITAL_COMMUNITY): Payer: Medicare Other | Attending: Cardiology | Admitting: Radiology

## 2014-02-11 DIAGNOSIS — Z6838 Body mass index (BMI) 38.0-38.9, adult: Secondary | ICD-10-CM | POA: Insufficient documentation

## 2014-02-11 DIAGNOSIS — I10 Essential (primary) hypertension: Secondary | ICD-10-CM | POA: Insufficient documentation

## 2014-02-11 DIAGNOSIS — E785 Hyperlipidemia, unspecified: Secondary | ICD-10-CM | POA: Insufficient documentation

## 2014-02-11 DIAGNOSIS — I252 Old myocardial infarction: Secondary | ICD-10-CM | POA: Insufficient documentation

## 2014-02-11 DIAGNOSIS — I079 Rheumatic tricuspid valve disease, unspecified: Secondary | ICD-10-CM | POA: Insufficient documentation

## 2014-02-11 DIAGNOSIS — I2510 Atherosclerotic heart disease of native coronary artery without angina pectoris: Secondary | ICD-10-CM

## 2014-02-11 DIAGNOSIS — I359 Nonrheumatic aortic valve disorder, unspecified: Secondary | ICD-10-CM | POA: Insufficient documentation

## 2014-02-11 DIAGNOSIS — R011 Cardiac murmur, unspecified: Secondary | ICD-10-CM | POA: Insufficient documentation

## 2014-02-11 DIAGNOSIS — E119 Type 2 diabetes mellitus without complications: Secondary | ICD-10-CM | POA: Insufficient documentation

## 2014-02-11 DIAGNOSIS — I059 Rheumatic mitral valve disease, unspecified: Secondary | ICD-10-CM | POA: Insufficient documentation

## 2014-02-11 NOTE — Progress Notes (Signed)
Echocardiogram performed. Please call Landis GandyNancy McCoy, daughter with echo results.  [phone]

## 2014-02-16 ENCOUNTER — Ambulatory Visit (INDEPENDENT_AMBULATORY_CARE_PROVIDER_SITE_OTHER): Payer: Medicare Other | Admitting: Vascular Surgery

## 2014-02-16 ENCOUNTER — Ambulatory Visit (HOSPITAL_COMMUNITY)
Admit: 2014-02-16 | Discharge: 2014-02-16 | Disposition: A | Discharge status: Home / Self Care | Payer: Medicare Other | Attending: Vascular Surgery | Admitting: Vascular Surgery

## 2014-02-16 ENCOUNTER — Encounter: Payer: Self-pay | Admitting: Vascular Surgery

## 2014-02-16 VITALS — BP 206/82 | HR 65 | Resp 16 | Ht 59.0 in | Wt 199.2 lb

## 2014-02-16 DIAGNOSIS — I739 Peripheral vascular disease, unspecified: Secondary | ICD-10-CM

## 2014-02-16 DIAGNOSIS — Z01810 Encounter for preprocedural cardiovascular examination: Secondary | ICD-10-CM

## 2014-02-16 DIAGNOSIS — Z01818 Encounter for other preprocedural examination: Secondary | ICD-10-CM | POA: Insufficient documentation

## 2014-02-16 HISTORY — DX: Peripheral vascular disease, unspecified: I73.9

## 2014-02-16 NOTE — Progress Notes (Signed)
The patient presents today for continued discussion regarding her left foot ischemia. Since my last visit with her she has undergone arteriogram and also underwent vein mapping today. Her outpatient arteriogram revealed no significant narrowing in the aorta iliac segments. She did have severe stenosis throughout her left superficial femoral artery and had occlusion of her above-knee popliteal artery through the popliteal and proximal trifurcation. She had reconstitution of her posterior tibial artery into the foot. She has also seen Dr. Katrinka BlazingSmith for cardiac evaluation. She did have some changes in her Cardiolite study and was felt to be relatively high risk for surgery. She does have chronic renal insufficiency as well. She did have a limited contrast with her arteriogram and seems to have had no worsening renal function. She does report continued rest pain in her left foot. Portion she has had no progression of any tissue loss. She also has severe calf claudication.  Past Medical History  Diagnosis Date  . Diabetes mellitus without complication   . Hypertension   . MI (myocardial infarction)   . Asthma   . Coronary artery disease     a. s/p MI 1995 tx w/ PTCA to Dx, Ramus, ap LAD;  b. LHC (11/2004):  Inferior HK, EF 60%, proximal LAD 50%, ostial D1 80%, mid D1 95%, mid LAD 50-60%, distal LAD at the apex 90%, OM1 occluded (fills late by left to left collaterals), RCA 75%. Medical therapy recommended.;  c. Lexiscan Myoview (12/2013):  Prior ant-lat scar with very small area of peri-infarct ischemia in ant wall, EF 40 (high risk)   . Hx of echocardiogram     a. Echocardiogram (04/2005):EF 55-65%, moderate LVH, mild aortic stenosis (mean gradient 9), moderate LAE.  [name]. Chronic diastolic CHF (congestive heart failure)   . Hyperlipidemia   . CKD (chronic kidney disease) stage 3, GFR 30-59 ml/min     Dr. Hyman HopesWebb  . PAD (peripheral artery disease)   . Carotid stenosis     Carotid US (12/2013): < 60% RCA, < 40% LICA.     History  Substance Use Topics  . Smoking status: Never Smoker   . Smokeless tobacco: Never Used  . Alcohol Use: No    Family History  Problem Relation Age of Onset  . Diabetes Father   . Hypertension Father   . Hypertension Daughter   . Hyperlipidemia Daughter   . Hyperlipidemia Son     No Known Allergies  Current outpatient prescriptions:albuterol (PROVENTIL HFA;VENTOLIN HFA) 108 (90 BASE) MCG/ACT inhaler, Inhale 2 puffs into the lungs every 6 (six) hours as needed for wheezing or shortness of breath., Disp: , Rfl: ;  aspirin 81 MG tablet, Take 81 mg by mouth daily., Disp: , Rfl: ;  B Complex-C-Folic Acid (RENA-VITE RX) 1 MG TABS, daily., Disp: , Rfl: ;  benazepril (LOTENSIN) 10 MG tablet, Take 10 mg by mouth daily. , Disp: , Rfl:  carvedilol (COREG) 12.5 MG tablet, Take 1 tablet (12.5 mg total) by mouth 2 (two) times daily., Disp: 180 tablet, Rfl: 1;  carvedilol (COREG) 6.25 MG tablet, 12.5 mg 2 (two) times daily. , Disp: , Rfl: ;  furosemide (LASIX) 80 MG tablet, Take 40 mg by mouth daily., Disp: , Rfl: ;  JANUVIA 25 MG tablet, Take 25 mg by mouth daily. , Disp: , Rfl: ;  ONETOUCH DELICA LANCETS 33G MISC, , Disp: , Rfl: ;  ONETOUCH VERIO test strip, , Disp: , Rfl:  oxyCODONE-acetaminophen (PERCOCET/ROXICET) 5-325 MG per tablet, 5 tablets every 8 (eight) hours.,  Disp: , Rfl: ;  paricalcitol (ZEMPLAR) 1 MCG capsule, Take 1 mcg by mouth daily. , Disp: , Rfl: ;  amLODipine (NORVASC) 10 MG tablet, Take 10 mg by mouth daily., Disp: , Rfl: ;  atorvastatin (LIPITOR) 40 MG tablet, 40 mg daily., Disp: , Rfl:  folic acid-vitamin b complex-vitamin c-selenium-zinc (DIALYVITE) 3 MG TABS tablet, Take 1 tablet by mouth daily., Disp: , Rfl: ;  traMADol (ULTRAM) 50 MG tablet, Take 50 mg by mouth every 6 (six) hours as needed for moderate pain., Disp: , Rfl:   BP 206/82  Pulse 65  Resp 16  Ht 4\' 11"  (1.499 m)  Wt 199 lb 3.2 oz (90.357 kg)  BMI 40.21 kg/m2  Body mass index is 40.21  kg/(m^2).       I did review her recent 2-D echocardiogram as well her EF is 45-50% with no evidence of valvular significant dysfunction.  Vein map today showed patency of her saphenous vein bilaterally with at least 2-3 mm saphenous vein bilaterally  On physical exam she does have more edema in her distal calves and feet and physical. She does have some skin cracking on the her left foot and a not so on her right. There is no invasive infection.  Impression and plan: Severe ischemia of her left foot with rest pain. The patient is certainly at high risk for femoral to tibial bypass due to her cardiac and renal dysfunction. I do feel that her symptoms are related to severe arterial insufficiency. I discussed this at length with the patient and her family present. She understands that this is indicated for a rest pain but has not critical currently since she is not having any tissue loss. She will continue to consider the option of tolerating the pain versus the risk of bypass. She will call us if she wishes to proceed. Otherwise we'll see her in one month for close followup of her foot

## 2014-02-17 ENCOUNTER — Telehealth: Payer: Self-pay

## 2014-02-17 NOTE — Telephone Encounter (Signed)
pt given results of echo.Echocardiogram looks better than expected. No significant aortic valve disease and LVEF is okay. Cleared to proceed with vascular surgery.pt verbalized understanding.

## 2014-02-17 NOTE — Telephone Encounter (Signed)
Message copied by Jarvis NewcomerPARRIS-GODLEY, LISA S on Wed Feb 17, 2014 10:13 AM ------      Message from: Verdis PrimeSMITH, HENRY      Created: Sat Feb 13, 2014 12:32 PM       Echocardiogram looks better than expected. No significant aortic valve disease and LVEF is okay. Cleared to proceed with vascular surgery. ------

## 2014-02-22 ENCOUNTER — Inpatient Hospital Stay (HOSPITAL_COMMUNITY)
Admission: EM | Admit: 2014-02-22 | Discharge: 2014-03-02 | DRG: 253 | Disposition: A | Discharge status: Skilled Nursing Facility | Payer: Medicare Other | Attending: Vascular Surgery | Admitting: Vascular Surgery

## 2014-02-22 ENCOUNTER — Encounter (HOSPITAL_COMMUNITY): Payer: Self-pay | Admitting: Emergency Medicine

## 2014-02-22 ENCOUNTER — Telehealth: Payer: Self-pay

## 2014-02-22 DIAGNOSIS — I5032 Chronic diastolic (congestive) heart failure: Secondary | ICD-10-CM

## 2014-02-22 DIAGNOSIS — I739 Peripheral vascular disease, unspecified: Secondary | ICD-10-CM

## 2014-02-22 DIAGNOSIS — L98499 Non-pressure chronic ulcer of skin of other sites with unspecified severity: Secondary | ICD-10-CM

## 2014-02-22 DIAGNOSIS — D649 Anemia, unspecified: Secondary | ICD-10-CM | POA: Diagnosis present

## 2014-02-22 DIAGNOSIS — I129 Hypertensive chronic kidney disease with stage 1 through stage 4 chronic kidney disease, or unspecified chronic kidney disease: Secondary | ICD-10-CM | POA: Diagnosis present

## 2014-02-22 DIAGNOSIS — I70229 Atherosclerosis of native arteries of extremities with rest pain, unspecified extremity: Principal | ICD-10-CM | POA: Diagnosis present

## 2014-02-22 DIAGNOSIS — N183 Chronic kidney disease, stage 3 unspecified: Secondary | ICD-10-CM

## 2014-02-22 DIAGNOSIS — I658 Occlusion and stenosis of other precerebral arteries: Secondary | ICD-10-CM | POA: Diagnosis present

## 2014-02-22 DIAGNOSIS — I6529 Occlusion and stenosis of unspecified carotid artery: Secondary | ICD-10-CM | POA: Diagnosis present

## 2014-02-22 DIAGNOSIS — N184 Chronic kidney disease, stage 4 (severe): Secondary | ICD-10-CM | POA: Diagnosis present

## 2014-02-22 DIAGNOSIS — E785 Hyperlipidemia, unspecified: Secondary | ICD-10-CM | POA: Diagnosis present

## 2014-02-22 DIAGNOSIS — I2510 Atherosclerotic heart disease of native coronary artery without angina pectoris: Secondary | ICD-10-CM | POA: Diagnosis present

## 2014-02-22 DIAGNOSIS — Z6841 Body Mass Index (BMI) 40.0 and over, adult: Secondary | ICD-10-CM

## 2014-02-22 DIAGNOSIS — Z9861 Coronary angioplasty status: Secondary | ICD-10-CM

## 2014-02-22 DIAGNOSIS — Z7982 Long term (current) use of aspirin: Secondary | ICD-10-CM

## 2014-02-22 DIAGNOSIS — M79606 Pain in leg, unspecified: Secondary | ICD-10-CM

## 2014-02-22 DIAGNOSIS — Z79899 Other long term (current) drug therapy: Secondary | ICD-10-CM

## 2014-02-22 DIAGNOSIS — I5042 Chronic combined systolic (congestive) and diastolic (congestive) heart failure: Secondary | ICD-10-CM | POA: Diagnosis present

## 2014-02-22 DIAGNOSIS — R339 Retention of urine, unspecified: Secondary | ICD-10-CM | POA: Diagnosis not present

## 2014-02-22 DIAGNOSIS — J45909 Unspecified asthma, uncomplicated: Secondary | ICD-10-CM | POA: Diagnosis present

## 2014-02-22 DIAGNOSIS — E119 Type 2 diabetes mellitus without complications: Secondary | ICD-10-CM | POA: Diagnosis present

## 2014-02-22 DIAGNOSIS — I359 Nonrheumatic aortic valve disorder, unspecified: Secondary | ICD-10-CM | POA: Diagnosis present

## 2014-02-22 DIAGNOSIS — I252 Old myocardial infarction: Secondary | ICD-10-CM

## 2014-02-22 DIAGNOSIS — I509 Heart failure, unspecified: Secondary | ICD-10-CM | POA: Diagnosis present

## 2014-02-22 LAB — CBC
HCT: 32 % — ABNORMAL LOW (ref 36.0–46.0)
HEMOGLOBIN: 10.1 g/dL — AB (ref 12.0–15.0)
MCH: 29.1 pg (ref 26.0–34.0)
MCHC: 31.6 g/dL (ref 30.0–36.0)
MCV: 92.2 fL (ref 78.0–100.0)
Platelets: 255 10*3/uL (ref 150–400)
RBC: 3.47 MIL/uL — AB (ref 3.87–5.11)
RDW: 13.4 % (ref 11.5–15.5)
WBC: 9.9 10*3/uL (ref 4.0–10.5)

## 2014-02-22 LAB — URINALYSIS, ROUTINE W REFLEX MICROSCOPIC
Bilirubin Urine: NEGATIVE
Glucose, UA: NEGATIVE mg/dL
Hgb urine dipstick: NEGATIVE
Ketones, ur: NEGATIVE mg/dL
Leukocytes, UA: NEGATIVE
Nitrite: NEGATIVE
Protein, ur: 100 mg/dL — AB
SPECIFIC GRAVITY, URINE: 1.013 (ref 1.005–1.030)
Urobilinogen, UA: 0.2 mg/dL (ref 0.0–1.0)
pH: 5.5 (ref 5.0–8.0)

## 2014-02-22 LAB — URINE MICROSCOPIC-ADD ON

## 2014-02-22 LAB — PROTIME-INR
INR: 0.97 (ref 0.00–1.49)
Prothrombin Time: 12.7 seconds (ref 11.6–15.2)

## 2014-02-22 LAB — BASIC METABOLIC PANEL
BUN: 38 mg/dL — ABNORMAL HIGH (ref 6–23)
CALCIUM: 9.9 mg/dL (ref 8.4–10.5)
CO2: 27 meq/L (ref 19–32)
Chloride: 105 mEq/L (ref 96–112)
Creatinine, Ser: 2.94 mg/dL — ABNORMAL HIGH (ref 0.50–1.10)
GFR calc Af Amer: 16 mL/min — ABNORMAL LOW (ref >90)
GFR calc non Af Amer: 13 mL/min — ABNORMAL LOW (ref >90)
Glucose, Bld: 153 mg/dL — ABNORMAL HIGH (ref 70–99)
Potassium: 5.2 mEq/L (ref 3.7–5.3)
SODIUM: 144 meq/L (ref 137–147)

## 2014-02-22 LAB — GLUCOSE, CAPILLARY: Glucose-Capillary: 119 mg/dL — ABNORMAL HIGH (ref 70–99)

## 2014-02-22 MED ORDER — DEXTROSE 5 % IV SOLN
750.0000 mg | Freq: Two times a day (BID) | INTRAVENOUS | Status: DC
  Administered 2014-02-22 – 2014-02-23 (×3): 750 mg via INTRAVENOUS
  Filled 2014-02-22 (×6): qty 750

## 2014-02-22 MED ORDER — HYDRALAZINE HCL 20 MG/ML IJ SOLN: 10.0000 mg | INTRAMUSCULAR | Status: DC | PRN

## 2014-02-22 MED ORDER — ASPIRIN 81 MG PO TABS
81.0000 mg | ORAL_TABLET | Freq: Every day | ORAL | Status: DC
  Filled 2014-02-22 (×2): qty 1

## 2014-02-22 MED ORDER — SODIUM CHLORIDE 0.9 % IJ SOLN
3.0000 mL | Freq: Two times a day (BID) | INTRAMUSCULAR | Status: DC
  Administered 2014-02-22 – 2014-02-24 (×4): 3 mL via INTRAVENOUS

## 2014-02-22 MED ORDER — PHENOL 1.4 % MT LIQD: 1.0000 | OROMUCOSAL | Status: DC | PRN

## 2014-02-22 MED ORDER — CARVEDILOL 12.5 MG PO TABS
12.5000 mg | ORAL_TABLET | Freq: Two times a day (BID) | ORAL | Status: DC
  Administered 2014-02-22 – 2014-03-02 (×16): 12.5 mg via ORAL
  Filled 2014-02-22 (×20): qty 1

## 2014-02-22 MED ORDER — ALBUTEROL SULFATE (2.5 MG/3ML) 0.083% IN NEBU: 2.5000 mg | INHALATION_SOLUTION | Freq: Four times a day (QID) | RESPIRATORY_TRACT | Status: DC | PRN

## 2014-02-22 MED ORDER — AMLODIPINE BESYLATE 10 MG PO TABS
10.0000 mg | ORAL_TABLET | Freq: Every day | ORAL | Status: DC
  Administered 2014-02-22 – 2014-03-02 (×9): 10 mg via ORAL
  Filled 2014-02-22 (×9): qty 1

## 2014-02-22 MED ORDER — SODIUM CHLORIDE 0.9 % IV SOLN: 250.0000 mL | INTRAVENOUS | Status: DC | PRN

## 2014-02-22 MED ORDER — CARVEDILOL 12.5 MG PO TABS
12.5000 mg | ORAL_TABLET | Freq: Two times a day (BID) | ORAL | Status: DC
  Filled 2014-02-22 (×2): qty 1

## 2014-02-22 MED ORDER — OXYCODONE-ACETAMINOPHEN 5-325 MG PO TABS
1.0000 | ORAL_TABLET | ORAL | Status: DC | PRN
  Administered 2014-02-22 – 2014-02-23 (×2): 1 via ORAL
  Administered 2014-02-23 – 2014-02-24 (×2): 2 via ORAL
  Filled 2014-02-22 (×2): qty 2
  Filled 2014-02-22 (×2): qty 1

## 2014-02-22 MED ORDER — TRAMADOL HCL 50 MG PO TABS: 50.0000 mg | ORAL_TABLET | Freq: Four times a day (QID) | ORAL | Status: DC | PRN

## 2014-02-22 MED ORDER — POTASSIUM CHLORIDE CRYS ER 20 MEQ PO TBCR
20.0000 meq | EXTENDED_RELEASE_TABLET | Freq: Once | ORAL | Status: AC
  Administered 2014-02-22: 20 meq via ORAL
  Filled 2014-02-22: qty 1

## 2014-02-22 MED ORDER — MORPHINE SULFATE 2 MG/ML IJ SOLN: 2.0000 mg | INTRAMUSCULAR | Status: DC | PRN

## 2014-02-22 MED ORDER — LABETALOL HCL 5 MG/ML IV SOLN
10.0000 mg | INTRAVENOUS | Status: DC | PRN
  Filled 2014-02-22: qty 4

## 2014-02-22 MED ORDER — PANTOPRAZOLE SODIUM 40 MG PO TBEC
40.0000 mg | DELAYED_RELEASE_TABLET | Freq: Every day | ORAL | Status: DC
  Administered 2014-02-22 – 2014-03-02 (×8): 40 mg via ORAL
  Filled 2014-02-22 (×8): qty 1

## 2014-02-22 MED ORDER — OXYCODONE-ACETAMINOPHEN 5-325 MG PO TABS: 5.0000 | ORAL_TABLET | Freq: Three times a day (TID) | ORAL | Status: DC

## 2014-02-22 MED ORDER — FUROSEMIDE 40 MG PO TABS
40.0000 mg | ORAL_TABLET | Freq: Every day | ORAL | Status: DC
  Administered 2014-02-22 – 2014-02-23 (×2): 40 mg via ORAL
  Filled 2014-02-22: qty 1
  Filled 2014-02-22: qty 2
  Filled 2014-02-22: qty 1

## 2014-02-22 MED ORDER — ALUM & MAG HYDROXIDE-SIMETH 200-200-20 MG/5ML PO SUSP: 15.0000 mL | ORAL | Status: DC | PRN

## 2014-02-22 MED ORDER — ISOSORBIDE MONONITRATE ER 60 MG PO TB24
60.0000 mg | ORAL_TABLET | Freq: Every day | ORAL | Status: DC
  Administered 2014-02-22 – 2014-03-02 (×9): 60 mg via ORAL
  Filled 2014-02-22 (×9): qty 1

## 2014-02-22 MED ORDER — ATORVASTATIN CALCIUM 40 MG PO TABS
40.0000 mg | ORAL_TABLET | Freq: Every day | ORAL | Status: DC
  Administered 2014-02-22 – 2014-03-02 (×9): 40 mg via ORAL
  Filled 2014-02-22 (×9): qty 1

## 2014-02-22 MED ORDER — RENA-VITE RX 1 MG PO TABS
ORAL_TABLET | Freq: Every day | ORAL | Status: DC
Start: 2014-02-22 — End: 2014-02-23
  Administered 2014-02-22: 14:00:00 via ORAL
  Filled 2014-02-22 (×2): qty 1

## 2014-02-22 MED ORDER — ONDANSETRON HCL 4 MG/2ML IJ SOLN: 4.0000 mg | Freq: Four times a day (QID) | INTRAMUSCULAR | Status: DC | PRN

## 2014-02-22 MED ORDER — BENAZEPRIL HCL 10 MG PO TABS
10.0000 mg | ORAL_TABLET | Freq: Every day | ORAL | Status: DC
Start: 2014-02-22 — End: 2014-02-23
  Administered 2014-02-22 – 2014-02-23 (×2): 10 mg via ORAL
  Filled 2014-02-22 (×2): qty 1

## 2014-02-22 MED ORDER — GUAIFENESIN-DM 100-10 MG/5ML PO SYRP: 15.0000 mL | ORAL_SOLUTION | ORAL | Status: DC | PRN

## 2014-02-22 MED ORDER — ALBUTEROL SULFATE HFA 108 (90 BASE) MCG/ACT IN AERS: 2.0000 | INHALATION_SPRAY | Freq: Four times a day (QID) | RESPIRATORY_TRACT | Status: DC | PRN

## 2014-02-22 MED ORDER — PARICALCITOL 1 MCG PO CAPS
1.0000 ug | ORAL_CAPSULE | Freq: Every day | ORAL | Status: DC
  Administered 2014-02-22 – 2014-03-02 (×8): 1 ug via ORAL
  Filled 2014-02-22 (×9): qty 1

## 2014-02-22 MED ORDER — ASPIRIN 81 MG PO CHEW
81.0000 mg | CHEWABLE_TABLET | Freq: Every day | ORAL | Status: DC
  Administered 2014-02-22 – 2014-03-02 (×9): 81 mg via ORAL
  Filled 2014-02-22 (×9): qty 1

## 2014-02-22 MED ORDER — METOPROLOL TARTRATE 1 MG/ML IV SOLN: 2.0000 mg | INTRAVENOUS | Status: DC | PRN

## 2014-02-22 MED ORDER — SODIUM CHLORIDE 0.9 % IJ SOLN: 3.0000 mL | INTRAMUSCULAR | Status: DC | PRN

## 2014-02-22 MED ORDER — DIALYVITE 3000 3 MG PO TABS: 1.0000 | ORAL_TABLET | Freq: Every day | ORAL | Status: DC

## 2014-02-22 MED ORDER — DEXTROSE 5 % IV SOLN
1.5000 g | Freq: Two times a day (BID) | INTRAVENOUS | Status: DC
  Filled 2014-02-22 (×2): qty 1.5

## 2014-02-22 MED ORDER — LINAGLIPTIN 5 MG PO TABS
5.0000 mg | ORAL_TABLET | Freq: Every day | ORAL | Status: DC
  Administered 2014-02-22 – 2014-03-02 (×8): 5 mg via ORAL
  Filled 2014-02-22 (×9): qty 1

## 2014-02-22 MED ORDER — ADULT MULTIVITAMIN W/MINERALS CH
1.0000 | ORAL_TABLET | Freq: Every day | ORAL | Status: DC
  Filled 2014-02-22: qty 1

## 2014-02-22 MED ORDER — ENOXAPARIN SODIUM 30 MG/0.3ML ~~LOC~~ SOLN
30.0000 mg | SUBCUTANEOUS | Status: DC
  Administered 2014-02-22: 30 mg via SUBCUTANEOUS
  Filled 2014-02-22 (×3): qty 0.3

## 2014-02-22 NOTE — ED Notes (Signed)
Vascular Doctor at bedside. 

## 2014-02-22 NOTE — ED Notes (Signed)
Patient states she has been being seen for poor circulation problems and is supposed to have bypass surgery.   Patient states poor circulation to L leg/foot for over 6 months.   Patient states that she has an artery totally blocked from thigh to knee.  Patient L foot swollen, pink with peeling skin.   Patient and her daughter advised that the peeling of the skin and discoloration is new.

## 2014-02-22 NOTE — ED Notes (Signed)
Secretary paged vascular Doctor.

## 2014-02-22 NOTE — Telephone Encounter (Signed)
Daughter called on pt's. behalf;  Reports the pain in her (L) 2nd toe is worsening; also reports new drainage from same site.  States the Oxycodone is not working; it dulls the pain only.  Daughter is enroute to the ER with the pt.  Voiced concern that pt. is miserable and questions what can be done?  Notified Dr. Arbie CookeyEarly;  Recommended that the ER notify him when pt. arrives.  Recommended to schedule her for left leg bypass on 02/24/14.  Notified ER that pt. coming in and need to page Dr. Arbie CookeyEarly.  Daughter notified to proceed to ER.

## 2014-02-22 NOTE — H&P (Signed)
Patient name: Dorothy FraiseCorine H Corporan MRN: [id number] DOB: October 12, 1926 Sex: female     Reason for referral:  Chief Complaint  Patient presents with  . Circulatory Problem    HISTORY OF PRESENT ILLNESS: Patient is a very pleasant 80 year old female with known severe peripheral vascular occlusive disease left leg. She has had arterial rest pain has been followed in the office. She did undergo outpatient arteriogram several weeks ago and this revealed SFA disease and popliteal occlusion with reconstitution of the posterior tibial artery and mid calf. Cardiac preoperative evaluation with Dr. Katrinka BlazingSmith  revealed  coronary artery disease she is felt to be at increased risk for surgery. She was tolerating or rest pain and had no significant tissue loss. She was seen last week in the office for further discussion. Vein map revealed patent saphenous veins bilaterally with 2-3 mm size throughout the course of both legs. Felt that in all likelihood she would require eventual femoral to tibial bypass. Since her rest pain was controlled and no progressive tissue loss she was 16 to be followed in the outpatient setting. She presents today to the emergency room reporting that her pain has not been be controlled with Percocet at home. She is here today with her daughter. She is admitted for pain control and also for IV antibiotics D2 some drainage at the nailbed of her left second toe.  Past Medical History  Diagnosis Date  . Diabetes mellitus without complication   . Hypertension   . MI (myocardial infarction)   . Asthma   . Coronary artery disease     a. s/p MI 1995 tx w/ PTCA to Dx, Ramus, ap LAD;  b. LHC (11/2004):  Inferior HK, EF 60%, proximal LAD 50%, ostial D1 80%, mid D1 95%, mid LAD 50-60%, distal LAD at the apex 90%, OM1 occluded (fills late by left to left collaterals), RCA 75%. Medical therapy recommended.;  c. Lexiscan Myoview (12/2013):  Prior ant-lat scar with very small area of peri-infarct ischemia  in ant wall, EF 40 (high risk)   . Hx of echocardiogram     a. Echocardiogram (04/2005):EF 55-65%, moderate LVH, mild aortic stenosis (mean gradient 9), moderate LAE.  [name]. Chronic diastolic CHF (congestive heart failure)   . Hyperlipidemia   . CKD (chronic kidney disease) stage 3, GFR 30-59 ml/min     Dr. Hyman HopesWebb  . PAD (peripheral artery disease)   . Carotid stenosis     Carotid US (12/2013): < 60% RCA, < 40% LICA.    Past Surgical History  Procedure Laterality Date  . Repair of nerve in left arm  Left 2000  . Cardiac catheterization      History   Social History  . Marital Status: Widowed    Spouse Name: N/A    Number of Children: N/A  . Years of Education: N/A   Occupational History  . Not on file.   Social History Main Topics  . Smoking status: Never Smoker   . Smokeless tobacco: Never Used  . Alcohol Use: No  . Drug Use: No  . Sexual Activity: Not on file   Other Topics Concern  . Not on file   Social History Narrative  . No narrative on file    Family History  Problem Relation Age of Onset  . Diabetes Father   . Hypertension Father   . Hypertension Daughter   . Hyperlipidemia Daughter   . Hyperlipidemia Son     Allergies as of 02/22/2014  . (  No Known Allergies)    No current facility-administered medications on file prior to encounter.   Current Outpatient Prescriptions on File Prior to Encounter  Medication Sig Dispense Refill  . albuterol (PROVENTIL HFA;VENTOLIN HFA) 108 (90 BASE) MCG/ACT inhaler Inhale 2 puffs into the lungs every 6 (six) hours as needed for wheezing or shortness of breath.      [name] amLODipine (NORVASC) 10 MG tablet Take 10 mg by mouth daily.      [name] aspirin 81 MG tablet Take 81 mg by mouth daily.      [name] atorvastatin (LIPITOR) 40 MG tablet 40 mg daily.      . B Complex-C-Folic Acid (RENA-VITE RX) 1 MG TABS daily.      . benazepril (LOTENSIN) 10 MG tablet Take 10 mg by mouth daily.       . carvedilol (COREG) 12.5 MG tablet Take 1 tablet  (12.5 mg total) by mouth 2 (two) times daily.  180 tablet  1  . carvedilol (COREG) 6.25 MG tablet 12.5 mg 2 (two) times daily.       . folic acid-vitamin b complex-vitamin c-selenium-zinc (DIALYVITE) 3 MG TABS tablet Take 1 tablet by mouth daily.      . furosemide (LASIX) 80 MG tablet Take 40 mg by mouth daily.      [name] JANUVIA 25 MG tablet Take 25 mg by mouth daily.       [name] DELICA LANCETS 33G MISC       . ONETOUCH VERIO test strip       . oxyCODONE-acetaminophen (PERCOCET/ROXICET) 5-325 MG per tablet 5 tablets every 8 (eight) hours.      . paricalcitol (ZEMPLAR) 1 MCG capsule Take 1 mcg by mouth daily.       . traMADol (ULTRAM) 50 MG tablet Take 50 mg by mouth every 6 (six) hours as needed for moderate pain.         REVIEW OF SYSTEMS:  Positives indicated with an "X"  CARDIOVASCULAR:  [ ]  chest pain   [ ]  chest pressure   [ ]  palpitations   [ ]  orthopnea   [ ]  dyspnea on exertion   [ ]  claudication   [ ]  rest pain   [ ]  DVT   [ ]  phlebitis PULMONARY:   [ ]  productive cough   [ ]  asthma   [ ]  wheezing NEUROLOGIC:   [ ]  weakness  [ ]  paresthesias  [ ]  aphasia  [ ]  amaurosis  [ ]  dizziness HEMATOLOGIC:   [ ]  bleeding problems   [ ]  clotting disorders MUSCULOSKELETAL:  [ ]  joint pain   [ ]  joint swelling GASTROINTESTINAL: [ ]   blood in stool  [ ]   hematemesis GENITOURINARY:  [ ]   dysuria  [ ]   hematuria PSYCHIATRIC:  [ ]  history of major depression INTEGUMENTARY:  [ ]  rashes  [ ]  ulcers CONSTITUTIONAL:  [ ]  fever   [ ]  chills  PHYSICAL EXAMINATION:  General: The patient is a well-nourished female, in no acute distress. Vital signs are BP 183/69  Pulse 60  Temp(Src) 98.7 F (37.1 C) (Oral)  Resp 18  Ht 4\' 9"  (1.448 m)  Wt 201 lb (91.173 kg)  BMI 43.48 kg/m2  SpO2 97% Pulmonary: There is a good air exchange bilaterally  Abdomen: Soft and non-tender. Obese Musculoskeletal: There are no major deformities.  Dependent rubor in her left foot. Bilateral pitting  edema. Neurologic: No focal weakness or paresthesias are detected, Skin: There are no ulcer  or rashes noted. Does have some drainage around the nailbed on her second toe with some excoriation between it is Psychiatric: The patient has normal affect. Cardiovascular: Absent pedal pulses bilaterally     Impression and Plan:  Progressive rest pain and now with some tissue loss of the nailbed of the second toe. Patient be admitted for antibiotics and pain control. She will be on the OR schedule on Wednesday since that time is available tomorrow. Discussed this at length with the patient and her daughter. She understands that this is certainly limb threatening ischemia. Understands the magnitude of the surgery potential cardiac risk. The possibility of failure of bypass and limb failure despite attempt salvage. I have spoken with Dr. Katrinka Blazing who will see her for preoperative cardiac optimization.    Babbette Dalesandro [facility] [place] Office: [phone]

## 2014-02-22 NOTE — Consult Note (Addendum)
Name: Dorothy Bartlett is a 80 y.o. female Admit date: 02/22/2014 Referring Physician:  Brantley Fling. Early, MD Primary Physician:  Ralene Okoy Moreira Primary Cardiologist:  HWBSmith, III  Reason for Consultation:  Pre-op  ASSESSMENT: 1. Pre-operative cardiovascular evaluation 2. CAD with recent high risk nuclear study. No anginal or heart failure complaints and consistent with known previous anatomy and prior studies 3. Chronic combined systolic and diastolic heart failure, stable 4. PVD with left lower extremity rest pain. 5. Stage 4 CKD 6. Hypertension  PLAN:  1. CLEARED for upcoming vascular procedure for LLE rest pain. CV risk will be moderate to high given co-morbidities and known chronic CAD and CHF. Both CAD and CHF are stable on medical therapy. 2. Check baseline BNP 3. Continue beta blocker and add LA nitrate for anti- ischemic protection 4. Match I/O as much as possible peri- and post-op and diurese with lasix as needed for volume excess 5. Will follow with you   HPI: Admitted with left lower extremity rest pain and tissue injury due to PVD. Has longstanding history of CAD not ideal for CABG due to distal location. Has no angina or heart failure currently but has been quite sedentary due to claudication/foot pain.   Has CKD, mild aortic stenosis, Chronic diastolic/systolic HF, and DM.  PMH:   Past Medical History  Diagnosis Date  . Diabetes mellitus without complication   . Hypertension   . MI (myocardial infarction)   . Asthma   . Coronary artery disease     a. s/p MI 1995 tx w/ PTCA to Dx, Ramus, ap LAD;  b. LHC (11/2004):  Inferior HK, EF 60%, proximal LAD 50%, ostial D1 80%, mid D1 95%, mid LAD 50-60%, distal LAD at the apex 90%, OM1 occluded (fills late by left to left collaterals), RCA 75%. Medical therapy recommended.;  c. Lexiscan Myoview (12/2013):  Prior ant-lat scar with very small area of peri-infarct ischemia in ant wall, EF 40 (high risk)   . Hx of echocardiogram     a.  Echocardiogram (04/2005):EF 55-65%, moderate LVH, mild aortic stenosis (mean gradient 9), moderate LAE.  [name]. Chronic diastolic CHF (congestive heart failure)   . Hyperlipidemia   . CKD (chronic kidney disease) stage 3, GFR 30-59 ml/min     Dr. Hyman HopesWebb  . PAD (peripheral artery disease)   . Carotid stenosis     Carotid US (12/2013): < 60% RCA, < 40% LICA.    PSH:   Past Surgical History  Procedure Laterality Date  . Repair of nerve in left arm  Left 2000  . Cardiac catheterization     Allergies:  Review of patient's allergies indicates no known allergies. Prior to Admit Meds:   Prescriptions prior to admission  Medication Sig Dispense Refill  . albuterol (PROVENTIL HFA;VENTOLIN HFA) 108 (90 BASE) MCG/ACT inhaler Inhale 2 puffs into the lungs every 6 (six) hours as needed for wheezing or shortness of breath.      [name]. aspirin 81 MG tablet Take 81 mg by mouth daily.      [name]. atorvastatin (LIPITOR) 40 MG tablet 40 mg daily.      . B Complex-C-Folic Acid (RENA-VITE RX) 1 MG TABS Take 1 tablet by mouth daily.       . carvedilol (COREG) 12.5 MG tablet Take 1 tablet (12.5 mg total) by mouth 2 (two) times daily.  180 tablet  1  . furosemide (LASIX) 40 MG tablet Take 40 mg by mouth daily as needed for fluid.      .[name]  JANUVIA 25 MG tablet Take 25 mg by mouth daily.       [name] DELICA LANCETS 33G MISC       . ONETOUCH VERIO test strip       . oxyCODONE-acetaminophen (PERCOCET/ROXICET) 5-325 MG per tablet Take 1 tablet by mouth 2 (two) times daily.      . paricalcitol (ZEMPLAR) 1 MCG capsule Take 1 mcg by mouth daily.        Fam HX:    Family History  Problem Relation Age of Onset  . Diabetes Father   . Hypertension Father   . Hypertension Daughter   . Hyperlipidemia Daughter   . Hyperlipidemia Son    Social HX:    History   Social History  . Marital Status: Widowed    Spouse Name: N/A    Number of Children: N/A  . Years of Education: N/A   Occupational History  . Not on file.   Social  History Main Topics  . Smoking status: Never Smoker   . Smokeless tobacco: Never Used  . Alcohol Use: No  . Drug Use: No  . Sexual Activity: Not on file   Other Topics Concern  . Not on file   Social History Narrative  . No narrative on file     Review of Systems: No chills or fever. No angina or orthopnea. There is lower extremity edema.  Physical Exam: Blood pressure 187/85, pulse 66, temperature 97.7 F (36.5 C), temperature source Oral, resp. rate 18, height 4\' 9"  (1.448 m), weight 201 lb (91.173 kg), SpO2 99.00%. Weight change:     No JVD Lungs clear Cardiac exam with 2/6 SEM RUSB and no AR Bilateral lower extremity edema.  Labs: Lab Results  Component Value Date   WBC 9.9 02/22/2014   HGB 10.1* 02/22/2014   HCT 32.0* 02/22/2014   MCV 92.2 02/22/2014   PLT 255 02/22/2014    Recent Labs Lab 02/22/14 1128  NA 144  K 5.2  CL 105  CO2 27  BUN 38*  CREATININE 2.94*  CALCIUM 9.9  GLUCOSE 153*   No results found for this basename: PTT   Lab Results  Component Value Date   INR 0.97 02/22/2014   INR 0.92 01/07/2014       Radiology:  No results found.  EKG:  NSR with RBBB an LAHB  ECHO: 01/2014 Study Conclusions  - Left ventricle: The cavity size was normal. Wall thickness was increased in a pattern of mild LVH. Systolic function was mildly reduced. The estimated ejection fraction was in the range of 45% to 50%. There is hypokinesis of the inferior myocardium. Doppler parameters are consistent with abnormal left ventricular relaxation (grade 1 diastolic dysfunction). Doppler parameters are consistent with high ventricular filling pressure. - Aortic valve: Mean gradient: 8mm Hg (S). Peak gradient: 14mm Hg (S). - Mitral valve: Mild regurgitation. - Left atrium: The atrium was mildly dilated. Anterior-posterior dimension: 44mm (2D). - Pulmonary arteries: Systolic pressure was mildly increased. PA peak pressure: 41mm Hg (S).  PHARM NUCLEAR  STRESS: Overall Impression: High risk stress nuclear study . There is evidence of a previous anterior lateral MI with a very small area of peri-infarct ischemia in the anterior wall. . This defect is consistent with her history of known CAD and PTCA in the past.  LV Ejection Fraction: 40%. LV Wall Motion: The LV function is moderately depressed. There is global hypokinesis with severe hypokinesis of the lateral wall.      Ladarren Steiner  Rosina Lowenstein 02/22/2014 6:04 PM

## 2014-02-22 NOTE — ED Provider Notes (Signed)
MSE was initiated and I personally evaluated the patient and placed orders (if any) on arrival in on February 22, 2014.  The patient appears stable so that the remainder of the MSE may be completed by another provider.  Patient has a chronically ischemic foot. Scheduled to have bypass surgery in 2 days. To be seen by vascular surgery in the ED.  Juliet RudeNathan R. Rubin PayorPickering, MD 02/22/14 1225

## 2014-02-23 MED ORDER — DEXTROSE 5 % IV SOLN
1.5000 g | INTRAVENOUS | Status: AC
  Administered 2014-02-24: 1.5 g via INTRAVENOUS
  Filled 2014-02-23: qty 1.5

## 2014-02-23 MED ORDER — RENA-VITE PO TABS
1.0000 | ORAL_TABLET | Freq: Every day | ORAL | Status: DC
  Administered 2014-02-23: 1 via ORAL
  Administered 2014-02-24: 23:00:00 via ORAL
  Administered 2014-02-25 – 2014-03-01 (×5): 1 via ORAL
  Filled 2014-02-23 (×9): qty 1

## 2014-02-23 NOTE — Progress Notes (Signed)
[facility] Progress Note  02/23/2014 1:14 PM HD 1  Subjective:  No complaints other than her left foot still hurts.    Filed Vitals:   02/23/14 1303  BP: 132/64  Pulse: 65  Temp: 98.6 F (37 C)  Resp: 18      CBC    Component Value Date/Time   WBC 9.9 02/22/2014 1128   WBC 8.4 09/03/2008 1100   RBC 3.47* 02/22/2014 1128   RBC 3.67* 09/03/2008 1100   HGB 10.1* 02/22/2014 1128   HGB 11.3* 09/03/2008 1100   HCT 32.0* 02/22/2014 1128   HCT 34.3* 09/03/2008 1100   PLT 255 02/22/2014 1128   PLT 146 09/03/2008 1100   MCV 92.2 02/22/2014 1128   MCV 93.5 09/03/2008 1100   MCH 29.1 02/22/2014 1128   MCH 30.9 09/03/2008 1100   MCHC 31.6 02/22/2014 1128   MCHC 33.0 09/03/2008 1100   RDW 13.4 02/22/2014 1128   RDW 13.2 09/03/2008 1100   LYMPHSABS 2.4 09/03/2008 1100   MONOABS 0.6 09/03/2008 1100   EOSABS 0.1 09/03/2008 1100   BASOSABS 0.0 09/03/2008 1100    BMET    Component Value Date/Time   NA 144 02/22/2014 1128   K 5.2 02/22/2014 1128   CL 105 02/22/2014 1128   CO2 27 02/22/2014 1128   GLUCOSE 153* 02/22/2014 1128   BUN 38* 02/22/2014 1128   CREATININE 2.94* 02/22/2014 1128   CALCIUM 9.9 02/22/2014 1128   GFRNONAA 13* 02/22/2014 1128   GFRAA 16* 02/22/2014 1128    INR    Component Value Date/Time   INR 0.97 02/22/2014 1620     Intake/Output Summary (Last 24 hours) at 02/23/14 1314 Last data filed at 02/23/14 1230  Gross per 24 hour  Intake    483 ml  Output      0 ml  Net    483 ml     Assessment/Plan:  80 y.o. female is going to have a left femoral to posterior tibial bypass tomorrow HD 1  -She is still c/o pain in her left foot -understands she is going for surgery tomorrow -does not have any questions at this time -Creatinine is up today-will d/c lotensin  Doreatha MassedSamantha Kenitha Glendinning, PA-C [facility] [phone] 02/23/2014 1:14 PM

## 2014-02-23 NOTE — Progress Notes (Signed)
Spoke with Dorothy MassedSamantha Rhyne, Dorothy Bartlett regarding lovenox. Dose held this evening d/t surgery in am.Dorothy Bartlett, Gordy SaversVeronica E

## 2014-02-23 NOTE — Progress Notes (Signed)
   SUBJECTIVE:  No chest pain   PHYSICAL EXAM Filed Vitals:   02/22/14 2210 02/22/14 2327 02/23/14 0502 02/23/14 0900  BP:  144/62 109/59 127/63  Pulse: 59 59 59 66  Temp:   97.9 F (36.6 C) 99 F (37.2 C)  TempSrc:   Oral Oral  Resp:   18   Height:      Weight:      SpO2:    98%   General:  No distress Lungs:  Clear Heart:  RRR  LABS:  Results for orders placed during the hospital encounter of 02/22/14 (from the past 24 hour(s))  URINALYSIS, ROUTINE W REFLEX MICROSCOPIC     Status: Abnormal   Collection Time    02/22/14  3:16 PM      Result Value Ref Range   Color, Urine YELLOW  YELLOW   APPearance CLEAR  CLEAR   Specific Gravity, Urine 1.013  1.005 - 1.030   pH 5.5  5.0 - 8.0   Glucose, UA NEGATIVE  NEGATIVE mg/dL   Hgb urine dipstick NEGATIVE  NEGATIVE   Bilirubin Urine NEGATIVE  NEGATIVE   Ketones, ur NEGATIVE  NEGATIVE mg/dL   Protein, ur 161100 (*) NEGATIVE mg/dL   Urobilinogen, UA 0.2  0.0 - 1.0 mg/dL   Nitrite NEGATIVE  NEGATIVE   Leukocytes, UA NEGATIVE  NEGATIVE  URINE MICROSCOPIC-ADD ON     Status: None   Collection Time    02/22/14  3:16 PM      Result Value Ref Range   Squamous Epithelial / LPF RARE  RARE   Bacteria, UA RARE  RARE   Urine-Other RARE YEAST    PROTIME-INR     Status: None   Collection Time    02/22/14  4:20 PM      Result Value Ref Range   Prothrombin Time 12.7  11.6 - 15.2 seconds   INR 0.97  0.00 - 1.49  GLUCOSE, CAPILLARY     Status: Abnormal   Collection Time    02/22/14  4:55 PM      Result Value Ref Range   Glucose-Capillary 119 (*) 70 - 99 mg/dL    Intake/Output Summary (Last 24 hours) at 02/23/14 1138 Last data filed at 02/23/14 1001  Gross per 24 hour  Intake      3 ml  Output      0 ml  Net      3 ml    ASSESSMENT AND PLAN:  PREOP:  Clear for surgery by Dr. Katrinka BlazingSmith.    The patient tells me that this will be tomorrow.  We will follow through the surgery.  Continue current therapy.   Fayrene FearingJames  [facility] 02/23/2014 11:38 AM

## 2014-02-24 ENCOUNTER — Encounter (HOSPITAL_COMMUNITY)
Admission: EM | Disposition: A | Discharge status: Skilled Nursing Facility | Payer: Self-pay | Source: Home / Self Care | Attending: Vascular Surgery

## 2014-02-24 ENCOUNTER — Inpatient Hospital Stay (HOSPITAL_COMMUNITY): Admission: RE | Admit: 2014-02-24 | Payer: Medicare Other | Source: Ambulatory Visit | Admitting: Vascular Surgery

## 2014-02-24 ENCOUNTER — Encounter (HOSPITAL_COMMUNITY): Payer: Medicare Other | Admitting: Certified Registered"

## 2014-02-24 ENCOUNTER — Inpatient Hospital Stay (HOSPITAL_COMMUNITY): Payer: Medicare Other | Admitting: Certified Registered"

## 2014-02-24 DIAGNOSIS — I2510 Atherosclerotic heart disease of native coronary artery without angina pectoris: Secondary | ICD-10-CM

## 2014-02-24 DIAGNOSIS — N183 Chronic kidney disease, stage 3 unspecified: Secondary | ICD-10-CM

## 2014-02-24 DIAGNOSIS — I5032 Chronic diastolic (congestive) heart failure: Secondary | ICD-10-CM

## 2014-02-24 HISTORY — PX: FEMORAL-TIBIAL BYPASS GRAFT: SHX938

## 2014-02-24 LAB — BASIC METABOLIC PANEL
BUN: 43 mg/dL — AB (ref 6–23)
CHLORIDE: 101 meq/L (ref 96–112)
CO2: 25 mEq/L (ref 19–32)
Calcium: 9 mg/dL (ref 8.4–10.5)
Creatinine, Ser: 3.53 mg/dL — ABNORMAL HIGH (ref 0.50–1.10)
GFR calc Af Amer: 12 mL/min — ABNORMAL LOW (ref >90)
GFR, EST NON AFRICAN AMERICAN: 11 mL/min — AB (ref >90)
Glucose, Bld: 141 mg/dL — ABNORMAL HIGH (ref 70–99)
Potassium: 4.2 mEq/L (ref 3.7–5.3)
Sodium: 139 mEq/L (ref 137–147)

## 2014-02-24 LAB — URINE MICROSCOPIC-ADD ON

## 2014-02-24 LAB — CBC
HCT: 31.3 % — ABNORMAL LOW (ref 36.0–46.0)
HEMATOCRIT: 27.9 % — AB (ref 36.0–46.0)
HEMOGLOBIN: 8.7 g/dL — AB (ref 12.0–15.0)
Hemoglobin: 10.4 g/dL — ABNORMAL LOW (ref 12.0–15.0)
MCH: 28.5 pg (ref 26.0–34.0)
MCH: 29.9 pg (ref 26.0–34.0)
MCHC: 31.2 g/dL (ref 30.0–36.0)
MCHC: 33.2 g/dL (ref 30.0–36.0)
MCV: 91.5 fL (ref 78.0–100.0)
MCV: 89.9 fL (ref 78.0–100.0)
PLATELETS: 232 10*3/uL (ref 150–400)
Platelets: 244 10*3/uL (ref 150–400)
RBC: 3.05 MIL/uL — ABNORMAL LOW (ref 3.87–5.11)
RBC: 3.48 MIL/uL — ABNORMAL LOW (ref 3.87–5.11)
RDW: 13.6 % (ref 11.5–15.5)
RDW: 14 % (ref 11.5–15.5)
WBC: 9.1 10*3/uL (ref 4.0–10.5)
WBC: 12.7 10*3/uL — AB (ref 4.0–10.5)

## 2014-02-24 LAB — CREATININE, SERUM
CREATININE: 3.3 mg/dL — AB (ref 0.50–1.10)
GFR calc Af Amer: 14 mL/min — ABNORMAL LOW (ref >90)
GFR calc non Af Amer: 12 mL/min — ABNORMAL LOW (ref >90)

## 2014-02-24 LAB — PREPARE RBC (CROSSMATCH)

## 2014-02-24 LAB — URINALYSIS, ROUTINE W REFLEX MICROSCOPIC
BILIRUBIN URINE: NEGATIVE
Glucose, UA: NEGATIVE mg/dL
Hgb urine dipstick: NEGATIVE
Ketones, ur: NEGATIVE mg/dL
Leukocytes, UA: NEGATIVE
Nitrite: NEGATIVE
PH: 5 (ref 5.0–8.0)
PROTEIN: 100 mg/dL — AB
Specific Gravity, Urine: 1.017 (ref 1.005–1.030)
Urobilinogen, UA: 0.2 mg/dL (ref 0.0–1.0)

## 2014-02-24 LAB — GLUCOSE, CAPILLARY
GLUCOSE-CAPILLARY: 138 mg/dL — AB (ref 70–99)
GLUCOSE-CAPILLARY: 157 mg/dL — AB (ref 70–99)
Glucose-Capillary: 99 mg/dL (ref 70–99)

## 2014-02-24 LAB — ABO/RH: ABO/RH(D): AB NEG

## 2014-02-24 SURGERY — CREATION, BYPASS, ARTERIAL, FEMORAL TO TIBIAL, USING GRAFT
Anesthesia: General | Site: Leg Lower | Laterality: Left

## 2014-02-24 MED ORDER — ROCURONIUM BROMIDE 50 MG/5ML IV SOLN
INTRAVENOUS | Status: AC
  Filled 2014-02-24: qty 1

## 2014-02-24 MED ORDER — SUCCINYLCHOLINE CHLORIDE 20 MG/ML IJ SOLN
INTRAMUSCULAR | Status: AC
  Filled 2014-02-24: qty 1

## 2014-02-24 MED ORDER — POTASSIUM CHLORIDE CRYS ER 20 MEQ PO TBCR: 20.0000 meq | EXTENDED_RELEASE_TABLET | Freq: Every day | ORAL | Status: DC | PRN

## 2014-02-24 MED ORDER — INSULIN ASPART 100 UNIT/ML ~~LOC~~ SOLN
0.0000 [IU] | Freq: Three times a day (TID) | SUBCUTANEOUS | Status: DC
  Administered 2014-02-25 (×2): 2 [IU] via SUBCUTANEOUS
  Administered 2014-02-25: 5 [IU] via SUBCUTANEOUS
  Administered 2014-02-26 (×3): 2 [IU] via SUBCUTANEOUS
  Administered 2014-02-27 (×2): 3 [IU] via SUBCUTANEOUS
  Administered 2014-02-27: 5 [IU] via SUBCUTANEOUS
  Administered 2014-02-28: 3 [IU] via SUBCUTANEOUS
  Administered 2014-02-28: 8 [IU] via SUBCUTANEOUS
  Administered 2014-02-28 – 2014-03-01 (×3): 3 [IU] via SUBCUTANEOUS
  Administered 2014-03-01: 5 [IU] via SUBCUTANEOUS
  Administered 2014-03-02 (×3): 3 [IU] via SUBCUTANEOUS

## 2014-02-24 MED ORDER — ACETAMINOPHEN 650 MG RE SUPP: 325.0000 mg | RECTAL | Status: DC | PRN

## 2014-02-24 MED ORDER — SENNOSIDES-DOCUSATE SODIUM 8.6-50 MG PO TABS
1.0000 | ORAL_TABLET | Freq: Every evening | ORAL | Status: DC | PRN
Start: 2014-02-24 — End: 2014-03-02
  Filled 2014-02-24: qty 1

## 2014-02-24 MED ORDER — DOPAMINE-DEXTROSE 3.2-5 MG/ML-% IV SOLN: 3.0000 ug/kg/min | INTRAVENOUS | Status: DC

## 2014-02-24 MED ORDER — PHENYLEPHRINE HCL 10 MG/ML IJ SOLN
10.0000 mg | INTRAVENOUS | Status: DC | PRN
  Administered 2014-02-24: 10 ug/min via INTRAVENOUS

## 2014-02-24 MED ORDER — MORPHINE SULFATE 2 MG/ML IJ SOLN
2.0000 mg | INTRAMUSCULAR | Status: DC | PRN
  Administered 2014-02-25: 2 mg via INTRAVENOUS
  Filled 2014-02-24: qty 1

## 2014-02-24 MED ORDER — FENTANYL CITRATE 0.05 MG/ML IJ SOLN
INTRAMUSCULAR | Status: AC
  Filled 2014-02-24: qty 5

## 2014-02-24 MED ORDER — FENTANYL CITRATE 0.05 MG/ML IJ SOLN
25.0000 ug | INTRAMUSCULAR | Status: DC | PRN
  Administered 2014-02-24 (×2): 25 ug via INTRAVENOUS

## 2014-02-24 MED ORDER — PROMETHAZINE HCL 25 MG/ML IJ SOLN: 6.2500 mg | INTRAMUSCULAR | Status: DC | PRN

## 2014-02-24 MED ORDER — NEOSTIGMINE METHYLSULFATE 1 MG/ML IJ SOLN
INTRAMUSCULAR | Status: AC
  Filled 2014-02-24: qty 10

## 2014-02-24 MED ORDER — GLYCOPYRROLATE 0.2 MG/ML IJ SOLN
INTRAMUSCULAR | Status: DC | PRN
  Administered 2014-02-24: 0.6 mg via INTRAVENOUS

## 2014-02-24 MED ORDER — SODIUM CHLORIDE 0.9 % IV SOLN
INTRAVENOUS | Status: DC
  Administered 2014-02-24: 19:00:00 via INTRAVENOUS

## 2014-02-24 MED ORDER — LABETALOL HCL 5 MG/ML IV SOLN
10.0000 mg | INTRAVENOUS | Status: DC | PRN
  Filled 2014-02-24: qty 4

## 2014-02-24 MED ORDER — ROCURONIUM BROMIDE 100 MG/10ML IV SOLN
INTRAVENOUS | Status: DC | PRN
  Administered 2014-02-24: 40 mg via INTRAVENOUS

## 2014-02-24 MED ORDER — ONDANSETRON HCL 4 MG/2ML IJ SOLN
INTRAMUSCULAR | Status: DC | PRN
  Administered 2014-02-24: 4 mg via INTRAVENOUS

## 2014-02-24 MED ORDER — SUCCINYLCHOLINE CHLORIDE 20 MG/ML IJ SOLN
INTRAMUSCULAR | Status: DC | PRN
  Administered 2014-02-24: 120 mg via INTRAVENOUS

## 2014-02-24 MED ORDER — NEOSTIGMINE METHYLSULFATE 1 MG/ML IJ SOLN
INTRAMUSCULAR | Status: DC | PRN
  Administered 2014-02-24: 4 mg via INTRAVENOUS

## 2014-02-24 MED ORDER — DEXTROSE 5 % IV SOLN
1.5000 g | Freq: Two times a day (BID) | INTRAVENOUS | Status: DC
  Filled 2014-02-24 (×2): qty 1.5

## 2014-02-24 MED ORDER — 0.9 % SODIUM CHLORIDE (POUR BTL) OPTIME
TOPICAL | Status: DC | PRN
Start: 2014-02-24 — End: 2014-02-24
  Administered 2014-02-24: 2000 mL

## 2014-02-24 MED ORDER — ENOXAPARIN SODIUM 30 MG/0.3ML ~~LOC~~ SOLN
30.0000 mg | SUBCUTANEOUS | Status: DC
  Administered 2014-02-25 – 2014-03-02 (×6): 30 mg via SUBCUTANEOUS
  Filled 2014-02-24 (×7): qty 0.3

## 2014-02-24 MED ORDER — PHENYLEPHRINE HCL 10 MG/ML IJ SOLN
INTRAMUSCULAR | Status: DC | PRN
  Administered 2014-02-24: 40 ug via INTRAVENOUS

## 2014-02-24 MED ORDER — FUROSEMIDE 40 MG PO TABS: 40.0000 mg | ORAL_TABLET | Freq: Every day | ORAL | Status: DC

## 2014-02-24 MED ORDER — METOPROLOL TARTRATE 1 MG/ML IV SOLN: 2.0000 mg | INTRAVENOUS | Status: DC | PRN

## 2014-02-24 MED ORDER — ALUM & MAG HYDROXIDE-SIMETH 200-200-20 MG/5ML PO SUSP: 15.0000 mL | ORAL | Status: DC | PRN

## 2014-02-24 MED ORDER — SODIUM CHLORIDE 0.9 % IV SOLN: 500.0000 mL | Freq: Once | INTRAVENOUS | Status: AC | PRN

## 2014-02-24 MED ORDER — LIDOCAINE HCL (CARDIAC) 20 MG/ML IV SOLN
INTRAVENOUS | Status: DC | PRN
  Administered 2014-02-24: 60 mg via INTRAVENOUS

## 2014-02-24 MED ORDER — OXYCODONE-ACETAMINOPHEN 5-325 MG PO TABS
1.0000 | ORAL_TABLET | ORAL | Status: DC | PRN
  Administered 2014-02-24: 2 via ORAL
  Administered 2014-02-25 – 2014-02-28 (×3): 1 via ORAL
  Filled 2014-02-24 (×4): qty 1

## 2014-02-24 MED ORDER — ONDANSETRON HCL 4 MG/2ML IJ SOLN: 4.0000 mg | Freq: Four times a day (QID) | INTRAMUSCULAR | Status: DC | PRN

## 2014-02-24 MED ORDER — PHENYLEPHRINE 40 MCG/ML (10ML) SYRINGE FOR IV PUSH (FOR BLOOD PRESSURE SUPPORT)
PREFILLED_SYRINGE | INTRAVENOUS | Status: AC
  Filled 2014-02-24: qty 10

## 2014-02-24 MED ORDER — SODIUM CHLORIDE 0.9 % IR SOLN
Status: DC | PRN
  Administered 2014-02-24: 12:00:00

## 2014-02-24 MED ORDER — PROTAMINE SULFATE 10 MG/ML IV SOLN
INTRAVENOUS | Status: AC
  Filled 2014-02-24: qty 5

## 2014-02-24 MED ORDER — MIDAZOLAM HCL 2 MG/2ML IJ SOLN
INTRAMUSCULAR | Status: AC
  Filled 2014-02-24: qty 2

## 2014-02-24 MED ORDER — MEPERIDINE HCL 25 MG/ML IJ SOLN: 6.2500 mg | INTRAMUSCULAR | Status: DC | PRN

## 2014-02-24 MED ORDER — PROTAMINE SULFATE 10 MG/ML IV SOLN
INTRAVENOUS | Status: DC | PRN
  Administered 2014-02-24: 10 mg via INTRAVENOUS
  Administered 2014-02-24: 20 mg via INTRAVENOUS
  Administered 2014-02-24 (×2): 10 mg via INTRAVENOUS

## 2014-02-24 MED ORDER — LIDOCAINE HCL (CARDIAC) 20 MG/ML IV SOLN
INTRAVENOUS | Status: AC
  Filled 2014-02-24: qty 5

## 2014-02-24 MED ORDER — SODIUM CHLORIDE 0.9 % IV SOLN
INTRAVENOUS | Status: DC | PRN
  Administered 2014-02-24: 12:00:00 via INTRAVENOUS

## 2014-02-24 MED ORDER — ONDANSETRON HCL 4 MG/2ML IJ SOLN
INTRAMUSCULAR | Status: AC
  Filled 2014-02-24: qty 2

## 2014-02-24 MED ORDER — ACETAMINOPHEN 325 MG PO TABS
325.0000 mg | ORAL_TABLET | ORAL | Status: DC | PRN
  Administered 2014-02-25 – 2014-02-26 (×2): 650 mg via ORAL
  Filled 2014-02-24 (×2): qty 2

## 2014-02-24 MED ORDER — FENTANYL CITRATE 0.05 MG/ML IJ SOLN
INTRAMUSCULAR | Status: AC
  Administered 2014-02-24: 25 ug via INTRAVENOUS
  Filled 2014-02-24: qty 2

## 2014-02-24 MED ORDER — SODIUM CHLORIDE 0.9 % IV SOLN
INTRAVENOUS | Status: DC
  Administered 2014-02-24: 11:00:00 via INTRAVENOUS

## 2014-02-24 MED ORDER — DOCUSATE SODIUM 100 MG PO CAPS
100.0000 mg | ORAL_CAPSULE | Freq: Every day | ORAL | Status: DC
  Administered 2014-02-25 – 2014-03-02 (×6): 100 mg via ORAL
  Filled 2014-02-24 (×7): qty 1

## 2014-02-24 MED ORDER — GLYCOPYRROLATE 0.2 MG/ML IJ SOLN
INTRAMUSCULAR | Status: AC
  Filled 2014-02-24: qty 3

## 2014-02-24 MED ORDER — GUAIFENESIN-DM 100-10 MG/5ML PO SYRP: 15.0000 mL | ORAL_SOLUTION | ORAL | Status: DC | PRN

## 2014-02-24 MED ORDER — MAGNESIUM SULFATE 40 MG/ML IJ SOLN
2.0000 g | Freq: Every day | INTRAMUSCULAR | Status: DC | PRN
  Filled 2014-02-24: qty 50

## 2014-02-24 MED ORDER — DEXTROSE 5 % IV SOLN
750.0000 mg | Freq: Two times a day (BID) | INTRAVENOUS | Status: AC
  Administered 2014-02-24 – 2014-02-25 (×2): 750 mg via INTRAVENOUS
  Filled 2014-02-24 (×3): qty 750

## 2014-02-24 MED ORDER — FUROSEMIDE 40 MG PO TABS
40.0000 mg | ORAL_TABLET | Freq: Every day | ORAL | Status: DC | PRN
  Filled 2014-02-24: qty 1

## 2014-02-24 MED ORDER — HEPARIN SODIUM (PORCINE) 1000 UNIT/ML IJ SOLN
INTRAMUSCULAR | Status: DC | PRN
  Administered 2014-02-24: 8000 [IU] via INTRAVENOUS

## 2014-02-24 MED ORDER — HYDRALAZINE HCL 20 MG/ML IJ SOLN
10.0000 mg | INTRAMUSCULAR | Status: DC | PRN
  Filled 2014-02-24: qty 1

## 2014-02-24 MED ORDER — PHENOL 1.4 % MT LIQD: 1.0000 | OROMUCOSAL | Status: DC | PRN

## 2014-02-24 MED ORDER — FENTANYL CITRATE 0.05 MG/ML IJ SOLN
INTRAMUSCULAR | Status: DC | PRN
  Administered 2014-02-24 (×6): 50 ug via INTRAVENOUS

## 2014-02-24 MED ORDER — BISACODYL 10 MG RE SUPP: 10.0000 mg | Freq: Every day | RECTAL | Status: DC | PRN

## 2014-02-24 MED ORDER — PROPOFOL 10 MG/ML IV BOLUS
INTRAVENOUS | Status: DC | PRN
  Administered 2014-02-24: 120 mg via INTRAVENOUS
  Administered 2014-02-24: 20 mg via INTRAVENOUS

## 2014-02-24 MED ORDER — PROPOFOL 10 MG/ML IV BOLUS
INTRAVENOUS | Status: AC
  Filled 2014-02-24: qty 20

## 2014-02-24 MED ORDER — OXYCODONE-ACETAMINOPHEN 5-325 MG PO TABS
ORAL_TABLET | ORAL | Status: AC
  Administered 2014-02-24: 2 via ORAL
  Filled 2014-02-24: qty 2

## 2014-02-24 SURGICAL SUPPLY — 65 items
APL SKNCLS STERI-STRIP NONHPOA (GAUZE/BANDAGES/DRESSINGS) ×2
BANDAGE ESMARK 6X9 LF (GAUZE/BANDAGES/DRESSINGS) IMPLANT
BENZOIN TINCTURE PRP APPL 2/3 (GAUZE/BANDAGES/DRESSINGS) ×5 IMPLANT
BNDG CMPR 9X6 STRL LF SNTH (GAUZE/BANDAGES/DRESSINGS) ×1
BNDG ESMARK 6X9 LF (GAUZE/BANDAGES/DRESSINGS) ×3
CANISTER SUCTION 2500CC (MISCELLANEOUS) ×3 IMPLANT
CANNULA VESSEL 3MM 2 BLNT TIP (CANNULA) ×3 IMPLANT
CLIP LIGATING EXTRA MED SLVR (CLIP) ×3 IMPLANT
CLIP LIGATING EXTRA SM BLUE (MISCELLANEOUS) ×3 IMPLANT
CLOSURE WOUND 1/2 X4 (GAUZE/BANDAGES/DRESSINGS) ×2
COVER PROBE W GEL 5X96 (DRAPES) ×2 IMPLANT
COVER SURGICAL LIGHT HANDLE (MISCELLANEOUS) ×3 IMPLANT
CUFF TOURNIQUET SINGLE 24IN (TOURNIQUET CUFF) ×2 IMPLANT
DRAIN SNY 10X20 3/4 PERF (WOUND CARE) IMPLANT
DRAPE WARM FLUID 44X44 (DRAPE) ×3 IMPLANT
DRAPE X-RAY CASS 24X20 (DRAPES) IMPLANT
DRSG COVADERM 4X10 (GAUZE/BANDAGES/DRESSINGS) IMPLANT
DRSG COVADERM 4X6 (GAUZE/BANDAGES/DRESSINGS) ×2 IMPLANT
DRSG COVADERM 4X8 (GAUZE/BANDAGES/DRESSINGS) ×2 IMPLANT
ELECT REM PT RETURN 9FT ADLT (ELECTROSURGICAL) ×3
ELECTRODE REM PT RTRN 9FT ADLT (ELECTROSURGICAL) ×1 IMPLANT
EVACUATOR SILICONE 100CC (DRAIN) IMPLANT
GLOVE BIOGEL PI IND STRL 6.5 (GLOVE) IMPLANT
GLOVE BIOGEL PI IND STRL 7.0 (GLOVE) IMPLANT
GLOVE BIOGEL PI IND STRL 7.5 (GLOVE) IMPLANT
GLOVE BIOGEL PI INDICATOR 6.5 (GLOVE) ×8
GLOVE BIOGEL PI INDICATOR 7.0 (GLOVE) ×4
GLOVE BIOGEL PI INDICATOR 7.5 (GLOVE) ×2
GLOVE ECLIPSE 6.5 STRL STRAW (GLOVE) ×10 IMPLANT
GLOVE SS BIOGEL STRL SZ 6.5 (GLOVE) IMPLANT
GLOVE SS BIOGEL STRL SZ 7 (GLOVE) IMPLANT
GLOVE SS BIOGEL STRL SZ 7.5 (GLOVE) ×1 IMPLANT
GLOVE SUPERSENSE BIOGEL SZ 6.5 (GLOVE) ×2
GLOVE SUPERSENSE BIOGEL SZ 7 (GLOVE) ×4
GLOVE SUPERSENSE BIOGEL SZ 7.5 (GLOVE) ×2
GOWN STRL REUS W/ TWL LRG LVL3 (GOWN DISPOSABLE) ×3 IMPLANT
GOWN STRL REUS W/ TWL XL LVL3 (GOWN DISPOSABLE) IMPLANT
GOWN STRL REUS W/TWL LRG LVL3 (GOWN DISPOSABLE) ×18
GOWN STRL REUS W/TWL XL LVL3 (GOWN DISPOSABLE) ×3
KIT BASIN OR (CUSTOM PROCEDURE TRAY) ×3 IMPLANT
KIT ROOM TURNOVER OR (KITS) ×3 IMPLANT
NS IRRIG 1000ML POUR BTL (IV SOLUTION) ×6 IMPLANT
PACK PERIPHERAL VASCULAR (CUSTOM PROCEDURE TRAY) ×3 IMPLANT
PAD ARMBOARD 7.5X6 YLW CONV (MISCELLANEOUS) ×6 IMPLANT
PAD DEFIB R2 (MISCELLANEOUS) ×2 IMPLANT
PADDING CAST COTTON 6X4 STRL (CAST SUPPLIES) ×2 IMPLANT
SET COLLECT BLD 21X3/4 12 (NEEDLE) IMPLANT
STAPLER VISISTAT 35W (STAPLE) IMPLANT
STOPCOCK 4 WAY LG BORE MALE ST (IV SETS) IMPLANT
STRIP CLOSURE SKIN 1/2X4 (GAUZE/BANDAGES/DRESSINGS) ×3 IMPLANT
SUT ETHILON 3 0 PS 1 (SUTURE) IMPLANT
SUT PROLENE 5 0 C 1 24 (SUTURE) ×3 IMPLANT
SUT PROLENE 6 0 CC (SUTURE) ×7 IMPLANT
SUT SILK 2 0 SH (SUTURE) ×3 IMPLANT
SUT SILK 3 0 (SUTURE) ×3
SUT SILK 3-0 18XBRD TIE 12 (SUTURE) IMPLANT
SUT VIC AB 2-0 CTX 36 (SUTURE) ×6 IMPLANT
SUT VIC AB 3-0 SH 27 (SUTURE) ×6
SUT VIC AB 3-0 SH 27X BRD (SUTURE) ×2 IMPLANT
TOWEL OR 17X24 6PK STRL BLUE (TOWEL DISPOSABLE) ×6 IMPLANT
TOWEL OR 17X26 10 PK STRL BLUE (TOWEL DISPOSABLE) ×6 IMPLANT
TRAY FOLEY CATH 16FRSI W/METER (SET/KITS/TRAYS/PACK) ×3 IMPLANT
TUBING EXTENTION W/L.L. (IV SETS) IMPLANT
UNDERPAD 30X30 INCONTINENT (UNDERPADS AND DIAPERS) ×3 IMPLANT
WATER STERILE IRR 1000ML POUR (IV SOLUTION) ×3 IMPLANT

## 2014-02-24 NOTE — Progress Notes (Signed)
Anemia and worsenning renal function noted on today's labs. No CV complaints. Hold lasix and ACE. Stool for occult blood

## 2014-02-24 NOTE — Op Note (Signed)
OPERATIVE REPORT  DATE OF SURGERY: 02/24/2014  PATIENT: Dorothy Bartlett, 80 y.o. female MRN: [id number]  DOB: 03-15-1926  PRE-OPERATIVE DIAGNOSIS: Critical limb ischemia left foot  POST-OPERATIVE DIAGNOSIS:  Same  PROCEDURE: Left femoral to posterior tibial bypass with in situ great saphenous vein  SURGEON:  Gretta Beganodd Brnadon Eoff, M.D.  PHYSICIAN ASSISTANT: Collins PA-C  ANESTHESIA:  Gen.  EBL: 100 ml  Total I/O In: 1835 [I.V.:1500; Blood:335] Out: 405 [Urine:205; Blood:200]  BLOOD ADMINISTERED: One unit packed red blood cells for preop anemia  DRAINS: None  SPECIMEN: None  COUNTS CORRECT:  YES  PLAN OF CARE: PACU   PATIENT DISPOSITION:  PACU - hemodynamically stable  PROCEDURE DETAILS: The patient was taken to the operating placed supine position where the area of the left groin left leg were prepped and draped in the sterile fashion. SonoSite ultrasound was used to visualize the great saphenous vein and the skin was marked to the course of the thigh and calf. The vein was of adequate caliber bile throughout and proximal calf. It did branch in the mid calf and this area was marked as well. Incision was made over the left groin and carried down to isolate the common superficial femoral and profundus femoris arteries. There was adequate pulse at this level. There was a calcified plaque in the artery. This saphenous vein was identified at the saphenofemoral junction was of very good caliber. A separate incision was made in the mid thigh and then in the distal thigh above the knee to expose the saphenous vein. The vein was left in situ. Incision was extended down onto the cath. The vein was of good caliber in the branches in the mid calf. The posterior tibial artery was exposed through the same medial incision. He artery was thickened. The artery had extreme calcification more proximally and was soft but was difficult to determine if it had a lumen at this level. This was at the level of  reconstitution with collateral on the preoperative arteriogram. This was the furthest that the saphenous vein could be extended before it branched. The saphenous vein was exposed down towards the ankle and was ligated distally and divided. Again the area after the branching was approximately 2 mm and before above this was 3-3-1/2 mm. The saphenous vein was occluded at the saphenofemoral junction with a Cooley clamp and the saphenous vein was excised from the saphenofemoral junction. The saphenofemoral junction was oversewn with a 5-0 Prolene suture. The patient was given 8000 intravenous heparin after adequate circulation time the common superficial femoral profundus femoris arteries were occluded. The common femoral artery was opened and extended onto the superficial femoral artery. The patient's patent was the lumen was widely patent. There was a plaque present. The saphenous vein was mobilized to this level and was sewn end-to-side to the artery with a running 6-0 Prolene suture. Usual flushing maneuvers were undertaken prior to completion anastomosis. Anastomosis completed and good anastomosis was encountered. Next the [employer] valvulotome was brought onto the field and several side branches were chosen and the vein valves were lysed through the [employer] valvulotome. The vein was of excellent caliber down to the branch point midcalf and became markedly smaller after this. A 24 cm pneumatic tourniquet was placed around the thigh, distal thigh. The leg was elevated and exsanguinated with an Esmarch tourniquet and the pneumatic tourniquet was inflated. The posterior tibial artery was opened at the lowest extent possible only saphenous vein prior to the branching. The artery was actually  occluded at this level. The artery was opened approximately 2-3 cm distal to this and did have thickening of the wall but was widely patent. The vein was cut to the appropriate length and was spatulated. A 2 dilator wouldn't pass through  this vein. The vein was spatulated and sewn end-to-side to the artery with a running 6-0 Prolene suture. Prior to completion of the anastomosis the tourniquet was deflated in the usual flushing maneuvers were undertaken. As was completed. Excellent graft-dependent Doppler flow was noted in the posterior tibial artery and at the posterior tibial artery at the ankle. The saphenous vein was interrogated with Doppler and side branches were identified and ligated. The patient was given 50 mg of protamine to reverse the heparin. Wound irrigated with saline. Hemostasis tablet cautery. The wounds were closed with 2-0 Vicryl the subcutaneous tissue. The skin was closed with 304 septic Vicryl sutures. Sterile dressing was applied and the patient was taken to the recovery room in stable condition.    Gretta Began, M.D. 02/24/2014 3:52 PM

## 2014-02-24 NOTE — Progress Notes (Signed)
  Patient: Dorothy Bartlett / Admit Date: 02/22/2014 / Date of Encounter: 02/24/2014, 9:05 AM   Subjective  L foot still "sensitive." Denies CP or SOB. Denies any unusual bleeding. Denies disruption in usual urination pattern.  Objective   Telemetry: NSR occ PACs, rare PVC  Physical Exam: Blood pressure 131/82, pulse 74, temperature 98.7 F (37.1 C), temperature source Oral, resp. rate 18, height 4\' 9"  (1.448 m), weight 201 lb (91.173 kg), SpO2 96.00%. General: Well developed, well nourished obese AAF in no acute distress. Head: Normocephalic, atraumatic, sclera non-icteric, no xanthomas, nares are without discharge. Neck: JVP not elevated. Lungs: Clear bilaterally to auscultation without wheezes, rales, or rhonchi. Breathing is unlabored. Heart: RRR S1 S2 without murmurs, rubs, or gallops.  Abdomen: Soft, non-tender, non-distended with normoactive bowel sounds. No rebound/guarding. Extremities: No clubbing or cyanosis. No edema. Neuro: Alert and oriented X 3. Moves all extremities spontaneously. Psych:  Responds to questions appropriately with a normal affect.   Intake/Output Summary (Last 24 hours) at 02/24/14 0905 Last data filed at 02/24/14 0700  Gross per 24 hour  Intake    603 ml  Output      0 ml  Net    603 ml    Inpatient Medications:  . amLODipine  10 mg Oral Daily  . aspirin  81 mg Oral Daily  . atorvastatin  40 mg Oral Daily  . carvedilol  12.5 mg Oral BID  . cefUROXime (ZINACEF)  IV  1.5 g Intravenous On Call to OR  . cefUROXime (ZINACEF)  IV  750 mg Intravenous Q12H  . enoxaparin (LOVENOX) injection  30 mg Subcutaneous Q24H  . furosemide  40 mg Oral Daily  . isosorbide mononitrate  60 mg Oral Daily  . linagliptin  5 mg Oral Daily  . multivitamin  1 tablet Oral QHS  . pantoprazole  40 mg Oral Daily  . paricalcitol  1 mcg Oral Daily  . sodium chloride  3 mL Intravenous Q12H   Infusions:    Labs:  Recent Labs  02/22/14 1128 02/24/14 0325  NA 144 139  K  5.2 4.2  CL 105 101  CO2 27 25  GLUCOSE 153* 141*  BUN 38* 43*  CREATININE 2.94* 3.53*  CALCIUM 9.9 9.0    Recent Labs  02/22/14 1128 02/24/14 0325  WBC 9.9 9.1  HGB 10.1* 8.7*  HCT 32.0* 27.9*  MCV 92.2 91.5  PLT 255 244   Radiology/Studies:  No results found.   Assessment and Plan  1. PVD - pending left femoral to posterior tibial bypass. Not sure if this will happen today based on Cr rise and anemia. 2. CAD with recent high risk nuclear study. No anginal or heart failure complaints and consistent with known previous anatomy and prior studies. Cleared for surgery from cardiology standpoint by Dr. Katrinka BlazingSmith with mod-high CV risk. Continue current regimen. 3. Chronic combined systolic and diastolic heart failure, stable - monitor daily weights, keep I/O's as even as possible. 4. Stage 4-5 CKD - ARB order discontinued yesterday (got yesterday AM). Cr continues to rise. Hold Lasix for now. May need to involve renal during this admission. Monitor I/Os. 5. Hypertension - controlled. 6. Anemia - per primary team. Pt denies bleeding. 7. Morbid obesity - Body mass index is 43.48 kg/(m^2).   Signed, Ronie Spiesayna Mrk Buzby PA-C

## 2014-02-24 NOTE — Anesthesia Postprocedure Evaluation (Signed)
  Anesthesia Post-op Note  Patient: Dorothy Bartlett  Procedure(s) Performed: Procedure(s): BYPASS GRAFT FEMORAL-TIBIAL ARTERY (Left)  Patient Location: PACU  Anesthesia Type:General  Level of Consciousness: awake  Airway and Oxygen Therapy: Patient Spontanous Breathing  Post-op Pain: mild  Post-op Assessment: Post-op Vital signs reviewed  Post-op Vital Signs: Reviewed  Complications: No apparent anesthesia complications

## 2014-02-24 NOTE — Transfer of Care (Signed)
Immediate Anesthesia Transfer of Care Note  Patient: Dorothy Bartlett  Procedure(s) Performed: Procedure(s): BYPASS GRAFT FEMORAL-TIBIAL ARTERY (Left)  Patient Location: PACU  Anesthesia Type:General  Level of Consciousness: awake, alert  and oriented  Airway & Oxygen Therapy: Patient connected to face mask oxygen  Post-op Assessment: Report given to PACU RN  Post vital signs: stable  Complications: No apparent anesthesia complications

## 2014-02-24 NOTE — Anesthesia Preprocedure Evaluation (Addendum)
Anesthesia Evaluation  Patient identified by MRN, date of birth, ID band Patient awake    Reviewed: Allergy & Precautions, H&P , NPO status , Patient's Chart, lab work & pertinent test results, reviewed documented beta blocker date and time   Airway Mallampati: II TM Distance: >3 FB Neck ROM: Full    Dental no notable dental hx.    Pulmonary shortness of breath, asthma ,  breath sounds clear to auscultation  Pulmonary exam normal       Cardiovascular hypertension, Pt. on medications and Pt. on home beta blockers + CAD, + Past MI, + Peripheral Vascular Disease and +CHF Rhythm:Regular Rate:Normal     Neuro/Psych negative neurological ROS  negative psych ROS   GI/Hepatic negative GI ROS, Neg liver ROS,   Endo/Other  diabetes, Well Controlled, Type 2, Oral Hypoglycemic AgentsMorbid obesity  Renal/GU CRFRenal disease  negative genitourinary   Musculoskeletal negative musculoskeletal ROS (+)   Abdominal   Peds negative pediatric ROS (+)  Hematology negative hematology ROS (+) anemia ,   Anesthesia Other Findings EF 45-50%.   Reproductive/Obstetrics negative OB ROS                      Anesthesia Physical Anesthesia Plan  ASA: III  Anesthesia Plan: General   Post-op Pain Management:    Induction: Intravenous  Airway Management Planned: Oral ETT  Additional Equipment: Arterial line  Intra-op Plan:   Post-operative Plan: Extubation in OR  Informed Consent: I have reviewed the patients History and Physical, chart, labs and discussed the procedure including the risks, benefits and alternatives for the proposed anesthesia with the patient or authorized representative who has indicated his/her understanding and acceptance.   Dental advisory given  Plan Discussed with: CRNA  Anesthesia Plan Comments:         Anesthesia Quick Evaluation

## 2014-02-24 NOTE — Anesthesia Procedure Notes (Signed)
Procedure Name: Intubation Date/Time: 02/24/2014 12:00 PM Performed by: Ellin GoodieWEAVER, Janayla Marik M Pre-anesthesia Checklist: Emergency Drugs available, Patient identified, Suction available, Patient being monitored and Timeout performed Patient Re-evaluated:Patient Re-evaluated prior to inductionOxygen Delivery Method: Circle system utilized Preoxygenation: Pre-oxygenation with 100% oxygen Intubation Type: IV induction and Rapid sequence Ventilation: Mask ventilation without difficulty Laryngoscope Size: Mac and 3 Grade View: Grade I Tube type: Oral Tube size: 7.5 mm Number of attempts: 1 Airway Equipment and Method: Stylet and Video-laryngoscopy Placement Confirmation: ETT inserted through vocal cords under direct vision,  positive ETCO2 and breath sounds checked- equal and bilateral Secured at: 19 cm Tube secured with: Tape Dental Injury: Teeth and Oropharynx as per pre-operative assessment  Comments: Easy atraumatic induction and intubation with videolaryngoscope.  DL x 1 by Dot LanesKrista, paramedic student.  Assisted by MDA.  Carlynn HeraldH Weaver, CRNA

## 2014-02-24 NOTE — Interval H&P Note (Signed)
History and Physical Interval Note:  02/24/2014 8:07 AM  Dorothy Bartlett  has presented today for surgery, with the diagnosis of Ischemia left foot with rest pain Peripheral Vascular Disease  The various methods of treatment have been discussed with the patient and family. After consideration of risks, benefits and other options for treatment, the patient has consented to  Procedure(s): BYPASS GRAFT FEMORAL-TIBIAL ARTERY (Left) as a surgical intervention .  The patient's history has been reviewed, patient examined, no change in status, stable for surgery.  I have reviewed the patient's chart and labs.  Questions were answered to the patient's satisfaction.     Rollyn Scialdone

## 2014-02-24 NOTE — Anesthesia Postprocedure Evaluation (Signed)
  Anesthesia Post-op Note  Patient: Dorothy Bartlett  Procedure(s) Performed: Procedure(s): BYPASS GRAFT FEMORAL-TIBIAL ARTERY (Left)  Patient Location: PACU  Anesthesia Type:General  Level of Consciousness: awake  Airway and Oxygen Therapy: Patient Spontanous Breathing  Post-op Pain: mild  Post-op Assessment: Post-op Vital signs reviewed  Post-op Vital Signs: Reviewed  Complications: No apparent anesthesia complications 

## 2014-02-25 LAB — BASIC METABOLIC PANEL
BUN: 42 mg/dL — ABNORMAL HIGH (ref 6–23)
CALCIUM: 8.3 mg/dL — AB (ref 8.4–10.5)
CO2: 22 mEq/L (ref 19–32)
Chloride: 103 mEq/L (ref 96–112)
Creatinine, Ser: 3.32 mg/dL — ABNORMAL HIGH (ref 0.50–1.10)
GFR calc Af Amer: 13 mL/min — ABNORMAL LOW (ref >90)
GFR, EST NON AFRICAN AMERICAN: 12 mL/min — AB (ref >90)
GLUCOSE: 155 mg/dL — AB (ref 70–99)
Potassium: 4.6 mEq/L (ref 3.7–5.3)
Sodium: 138 mEq/L (ref 137–147)

## 2014-02-25 LAB — GLUCOSE, CAPILLARY
GLUCOSE-CAPILLARY: 128 mg/dL — AB (ref 70–99)
GLUCOSE-CAPILLARY: 140 mg/dL — AB (ref 70–99)
GLUCOSE-CAPILLARY: 207 mg/dL — AB (ref 70–99)
Glucose-Capillary: 151 mg/dL — ABNORMAL HIGH (ref 70–99)

## 2014-02-25 LAB — CBC
HCT: 28.8 % — ABNORMAL LOW (ref 36.0–46.0)
Hemoglobin: 9.5 g/dL — ABNORMAL LOW (ref 12.0–15.0)
MCH: 29.9 pg (ref 26.0–34.0)
MCHC: 33 g/dL (ref 30.0–36.0)
MCV: 90.6 fL (ref 78.0–100.0)
Platelets: 207 10*3/uL (ref 150–400)
RBC: 3.18 MIL/uL — ABNORMAL LOW (ref 3.87–5.11)
RDW: 13.9 % (ref 11.5–15.5)
WBC: 10.8 10*3/uL — ABNORMAL HIGH (ref 4.0–10.5)

## 2014-02-25 NOTE — Progress Notes (Signed)
Pt discharged to [facility] via wheelchair. Pt is without complaints. Dorothy Bartlett, Dorothy Bartlett

## 2014-02-25 NOTE — Progress Notes (Addendum)
[facility]  Subjective  - Doing well. Taking PO liquids well.  She needed 2 person assistance for bed to chair transfer.   Objective 120/54 76 99.6 F (37.6 C) (Oral) 14 90%  Intake/Output Summary (Last 24 hours) at 02/25/14 0744 Last data filed at 02/25/14 0600  Gross per 24 hour  Intake   2425 ml  Output   1180 ml  Net   1245 ml    Doppler PT signal. Edema mod in the left foot. Dressings in place no active drainage, no hematoma palpated.   Assessment/Planning: POD #1 BYPASS GRAFT FEMORAL-TIBIAL ARTERY (Left)  One unit packed red blood cells for preop anemia Hgb 9.5  Plan ambulate, advance diet and transfer to 2[facility]      Clinton GallantCOLLINS, EMMA [facility] Center[facility]EN 02/25/2014 7:44 AM --  Laboratory Lab Results:  Recent Labs  02/24/14 2130 02/25/14 0344  WBC 12.7* 10.8*  HGB 10.4* 9.5*  HCT 31.3* 28.8*  PLT 232 207   BMET  Recent Labs  02/24/14 0325 02/24/14 2130 02/25/14 0344  NA 139  --  138  K 4.2  --  4.6  CL 101  --  103  CO2 25  --  22  GLUCOSE 141*  --  155*  BUN 43*  --  42*  CREATININE 3.53* 3.30* 3.32*  CALCIUM 9.0  --  8.3*    COAG Lab Results  Component Value Date   INR 0.97 02/22/2014   INR 0.92 01/07/2014   No results found for this basename: PTT      I have examined the patient, reviewed and agree with above. Pain controlled.  Mobilize  EARLY, TODD, MD 02/25/2014 10:59 AM

## 2014-02-25 NOTE — Progress Notes (Signed)
Pt has not voided since foley removal around 6:00am this morning. RN bladder scanned pt and pt has 413 cc in bladder. Will rescan pt in two hours and perform I&O cath pt if pt still is unable to void. Will continue to monitor.   Genevive Biavis, Darrell Leonhardt R, RN

## 2014-02-25 NOTE — Progress Notes (Signed)
       Patient Name: Dorothy Bartlett Date of Encounter: 02/25/2014    SUBJECTIVE: Doing well. No chest pain or dyspnea.  TELEMETRY:  NSR Filed Vitals:   02/25/14 0343 02/25/14 0440 02/25/14 0705 02/25/14 0719  BP:   120/54   Pulse:  95 86 76  Temp:  99.7 F (37.6 C) 99.6 F (37.6 C)   TempSrc:  Oral Oral   Resp:  17 21 14   Height:      Weight: 202 lb 13.2 oz (92 kg)     SpO2:   99% 90%    Intake/Output Summary (Last 24 hours) at 02/25/14 0950 Last data filed at 02/25/14 0600  Gross per 24 hour  Intake   2425 ml  Output   1180 ml  Net   1245 ml    LABS: Basic Metabolic Panel:  Recent Labs  44/02/4701/25/15 0325 02/24/14 2130 02/25/14 0344  NA 139  --  138  K 4.2  --  4.6  CL 101  --  103  CO2 25  --  22  GLUCOSE 141*  --  155*  BUN 43*  --  42*  CREATININE 3.53* 3.30* 3.32*  CALCIUM 9.0  --  8.3*   CBC:  Recent Labs  02/24/14 2130 02/25/14 0344  WBC 12.7* 10.8*  HGB 10.4* 9.5*  HCT 31.3* 28.8*  MCV 89.9 90.6  PLT 232 207    Hemoglobin A1C: No results found for this basename: HGBA1C,  in the last 72 hours Fasting Lipid Panel: No results found for this basename: CHOL, HDL, LDLCALC, TRIG, CHOLHDL, LDLDIRECT,  in the last 72 hours  Radiology/Studies:  No new data  Physical Exam: Blood pressure 120/54, pulse 76, temperature 99.6 F (37.6 C), temperature source Oral, resp. rate 14, height 4\' 11"  (1.499 m), weight 202 lb 13.2 oz (92 kg), SpO2 90.00%. Weight change:    No rub or gallop.  ASSESSMENT:  1.CASHD without angina or ischemic complications to this point. 2. Chronic systolic HF , stable. 3. S/P right lower extremity revascularization  Plan:  1. Continue surveillance 2. Follow renal function  Signed, Lesleigh NoeSMITH III,HENRY W 02/25/2014, 9:50 AM

## 2014-02-25 NOTE — Progress Notes (Deleted)
Subjective: Denies CP  Breathing is OK   Objective: Filed Vitals:   02/25/14 0343 02/25/14 0440 02/25/14 0705 02/25/14 0719  BP:   120/54   Pulse:  95 86 76  Temp:  99.7 F (37.6 C) 99.6 F (37.6 C)   TempSrc:  Oral Oral   Resp:  17 21 14   Height:      Weight: 202 lb 13.2 oz (92 kg)     SpO2:   99% 90%   Weight change:   Intake/Output Summary (Last 24 hours) at 02/25/14 6962 Last data filed at 02/25/14 0600  Gross per 24 hour  Intake   2425 ml  Output   1180 ml  Net   1245 ml   Net I/O since admit:   2 L positive  General: Alert, awake, oriented x3, in no acute distress Heart: Regular rate and rhythm, without murmurs, rubs, gallops.  Lungs: Clear to auscultation.  No rales or wheezes. Exemities:  1+ edema.   Neuro: Grossly intact, nonfocal.  Tele:  SR  Lab Results: Results for orders placed during the hospital encounter of 02/22/14 (from the past 24 hour(s))  URINALYSIS, ROUTINE W REFLEX MICROSCOPIC     Status: Abnormal   Collection Time    02/24/14 10:11 AM      Result Value Ref Range   Color, Urine YELLOW  YELLOW   APPearance CLEAR  CLEAR   Specific Gravity, Urine 1.017  1.005 - 1.030   pH 5.0  5.0 - 8.0   Glucose, UA NEGATIVE  NEGATIVE mg/dL   Hgb urine dipstick NEGATIVE  NEGATIVE   Bilirubin Urine NEGATIVE  NEGATIVE   Ketones, ur NEGATIVE  NEGATIVE mg/dL   Protein, ur 952 (*) NEGATIVE mg/dL   Urobilinogen, UA 0.2  0.0 - 1.0 mg/dL   Nitrite NEGATIVE  NEGATIVE   Leukocytes, UA NEGATIVE  NEGATIVE  URINE MICROSCOPIC-ADD ON     Status: Abnormal   Collection Time    02/24/14 10:11 AM      Result Value Ref Range   Squamous Epithelial / LPF FEW (*) RARE   WBC, UA 0-2  <3 WBC/hpf   Bacteria, UA RARE  RARE   Casts HYALINE CASTS (*) NEGATIVE   Urine-Other MUCOUS PRESENT    TYPE AND SCREEN     Status: None   Collection Time    02/24/14 10:30 AM      Result Value Ref Range   ABO/RH(D) AB NEG     Antibody Screen NEG     Sample Expiration 02/27/2014     Unit  Number [id number]     Blood Component Type RED CELLS,LR     Unit division 00     Status of Unit ALLOCATED     Transfusion Status OK TO TRANSFUSE     Crossmatch Result Compatible     Unit Number [id number]     Blood Component Type RED CELLS,LR     Unit division 00     Status of Unit ISSUED     Transfusion Status OK TO TRANSFUSE     Crossmatch Result Compatible    PREPARE RBC (CROSSMATCH)     Status: None   Collection Time    02/24/14 10:30 AM      Result Value Ref Range   Order Confirmation ORDER PROCESSED BY BLOOD BANK    GLUCOSE, CAPILLARY     Status: Abnormal   Collection Time    02/24/14 10:30 AM      Result Value Ref Range  Glucose-Capillary 138 (*) 70 - 99 mg/dL  ABO/RH     Status: None   Collection Time    02/24/14 10:30 AM      Result Value Ref Range   ABO/RH(D) AB NEG    GLUCOSE, CAPILLARY     Status: None   Collection Time    02/24/14  4:39 PM      Result Value Ref Range   Glucose-Capillary 99  70 - 99 mg/dL   Comment 1 Documented in Chart     Comment 2 Notify RN    CBC     Status: Abnormal   Collection Time    02/24/14  9:30 PM      Result Value Ref Range   WBC 12.7 (*) 4.0 - 10.5 K/uL   RBC 3.48 (*) 3.87 - 5.11 MIL/uL   Hemoglobin 10.4 (*) 12.0 - 15.0 g/dL   HCT 16.1 (*) 09.6 - 04.5 %   MCV 89.9  78.0 - 100.0 fL   MCH 29.9  26.0 - 34.0 pg   MCHC 33.2  30.0 - 36.0 g/dL   RDW 40.9  81.1 - 91.4 %   Platelets 232  150 - 400 K/uL  CREATININE, SERUM     Status: Abnormal   Collection Time    02/24/14  9:30 PM      Result Value Ref Range   Creatinine, Ser 3.30 (*) 0.50 - 1.10 mg/dL   GFR calc non Af Amer 12 (*) >90 mL/min   GFR calc Af Amer 14 (*) >90 mL/min  GLUCOSE, CAPILLARY     Status: Abnormal   Collection Time    02/24/14  9:46 PM      Result Value Ref Range   Glucose-Capillary 157 (*) 70 - 99 mg/dL   Comment 1 Notify RN     Comment 2 Documented in Chart    CBC     Status: Abnormal   Collection Time    02/25/14  3:44 AM      Result Value  Ref Range   WBC 10.8 (*) 4.0 - 10.5 K/uL   RBC 3.18 (*) 3.87 - 5.11 MIL/uL   Hemoglobin 9.5 (*) 12.0 - 15.0 g/dL   HCT 78.2 (*) 95.6 - 21.3 %   MCV 90.6  78.0 - 100.0 fL   MCH 29.9  26.0 - 34.0 pg   MCHC 33.0  30.0 - 36.0 g/dL   RDW 08.6  57.8 - 46.9 %   Platelets 207  150 - 400 K/uL  BASIC METABOLIC PANEL     Status: Abnormal   Collection Time    02/25/14  3:44 AM      Result Value Ref Range   Sodium 138  137 - 147 mEq/L   Potassium 4.6  3.7 - 5.3 mEq/L   Chloride 103  96 - 112 mEq/L   CO2 22  19 - 32 mEq/L   Glucose, Bld 155 (*) 70 - 99 mg/dL   BUN 42 (*) 6 - 23 mg/dL   Creatinine, Ser 6.29 (*) 0.50 - 1.10 mg/dL   Calcium 8.3 (*) 8.4 - 10.5 mg/dL   GFR calc non Af Amer 12 (*) >90 mL/min   GFR calc Af Amer 13 (*) >90 mL/min    Studies/Results: @RISRSLT24 @  Medications: Reviewed  @PROBHOSP @  1.  PVOD  Patient is POD 1 from revasc  2.  CAD  No symptoms of angina  Follow  COntinue tele  3  Chronic systolic/diastolic CHF  Volume status is  fair  Would keep off further IV fluids if possible Need to place on 2 g NA restriction.  4.  CKD Stage IV  Follow  Rel stable.    LOS: 3 days   Dorothy Bartlett 02/25/2014, 9:22 AM

## 2014-02-25 NOTE — Progress Notes (Signed)
Chaplain responded to spiritual care consult. Patient requested assistance to be made more comfortable. Chaplain assisted patient with calling RN. She appeared calm. Chaplain provided emotional support, hospitality, and caring presence. Patient said she has no specific emotional or spiritual needs at this time, but she appreciated visit.   Maurene CapesHillary D Irusta [phone] General: [phone]

## 2014-02-25 NOTE — Progress Notes (Signed)
OT Cancellation Note  Patient Details Name: Dorothy Bartlett MRN: [id number] DOB: 1926/05/03   Cancelled Treatment:    Reason Eval/Treat Not Completed: Other (comment) Attempted to see pt. Pt in process of being transferred to different unit. Will reuturn this pm if time or see in am. [facility] HospitalWARD,HILLARY Maya Arcand, OTR/L  [phone] 02/25/2014 02/25/2014, 4:25 PM

## 2014-02-25 NOTE — Progress Notes (Signed)
Report called to [place] on [facility]. All questions answered. Rosezella RumpfSwann, Ensley Blas D

## 2014-02-25 NOTE — Evaluation (Signed)
Physical Therapy Evaluation Patient Details Name: Dorothy FraiseCorine H Denison MRN: [id number] DOB: [birth year] Today's Date: 02/25/2014 Time: [id number] PT Time Calculation (min): 16 min  PT Assessment / Plan / Recommendation History of Present Illness  pt is s/p Lt BYPASS GRAFT FEMORAL-TIBIAL ARTERY   Clinical Impression  Patient is s/p surgery listed above resulting in functional limitations due to the deficits listed below (see PT Problem List).  Patient will benefit from skilled PT to increase their independence and safety with mobility to allow discharge to the venue listed below. Pt greatly limited in mobility secondary to pain and inability to WB through Lt LE. Pt requires 2 person (A) for mobility at this time. Discussed D/C disposition with pt and family. PT recommending SNF for post acute rehab prior to D/C home to be come more mobile and require less caregiver (A). Pt and family agreeable at this time but would like to progress pt to being able to go home post acute stay.      PT Assessment  Patient needs continued PT services    Follow Up Recommendations  SNF;Supervision/Assistance - 24 hour    Does the patient have the potential to tolerate intense rehabilitation      Barriers to Discharge Decreased caregiver support lives alone    Equipment Recommendations  Other (comment) (TBD)    Recommendations for Other Services OT consult   Frequency Min 2X/week    Precautions / Restrictions Precautions Precautions: Fall Restrictions Weight Bearing Restrictions: No   Pertinent Vitals/Pain 10/10 in Lt LE with activity. patient repositioned for comfort       Mobility  Bed Mobility Overal bed mobility: Needs Assistance Bed Mobility: Supine to Sit;Sit to Supine Supine to sit: +2 for safety/equipment;Min assist;HOB elevated Sit to supine: +2 for safety/equipment;Min assist General bed mobility comments: pt required max cues for sequencing; has difficulty following directions for hand  placment; (A) to advance LEs off/on EOB and elevate trunk; limited by pain in Lt LE  Transfers Overall transfer level: Needs assistance Equipment used: 2 person hand held assist Transfers: Sit to/from Stand Sit to Stand: +2 physical assistance;Mod assist General transfer comment: pt unable to WB through Lt LE due to pain; requires 2 person (A) to achieve upright standing position on Rt LE; max cues for sequencing and safety  Ambulation/Gait General Gait Details: unable to assess at this time due to pain     Exercises General Exercises - Lower Extremity Ankle Circles/Pumps: AROM;10 reps;Seated;Both Long Arc Quad: AROM;Right;5 reps;Seated   PT Diagnosis: Difficulty walking;Generalized weakness;Acute pain  PT Problem List: Decreased strength;Decreased range of motion;Decreased activity tolerance;Decreased balance;Decreased mobility;Decreased cognition;Decreased knowledge of use of DME;Decreased safety awareness;Pain;Impaired sensation PT Treatment Interventions: DME instruction;Gait training;Functional mobility training;Therapeutic activities;Therapeutic exercise;Balance training;Neuromuscular re-education;Patient/family education     PT Goals(Current goals can be found in the care plan section) Acute Rehab PT Goals Patient Stated Goal: none stated PT Goal Formulation: With patient/family Time For Goal Achievement: 03/11/14 Potential to Achieve Goals: Good  Visit Information  Last PT Received On: 02/25/14 Assistance Needed: +2 History of Present Illness: pt is s/p Lt BYPASS GRAFT FEMORAL-TIBIAL ARTERY        Prior Functioning  Home Living Family/patient expects to be discharged to:: Private residence Living Arrangements: Alone Available Help at Discharge: Family;Available 24 hours/day Type of Home: House Home Access: Stairs to enter Entergy CorporationEntrance Stairs-Number of Steps: 2 Entrance Stairs-Rails: None Home Layout: One level Home Equipment: Walker - 2 wheels;Bedside  commode Additional Comments: pt has tub shower  Prior  Function Level of Independence: Independent Comments: pt was independent prior to admission; family brings pt food or takes her to grocery store  Communication Communication: HOH Dominant Hand: Right    Cognition  Cognition Arousal/Alertness: Awake/alert Behavior During Therapy: Flat affect Overall Cognitive Status: Impaired/Different from baseline Area of Impairment: Orientation;Memory;Following commands;Safety/judgement;Problem solving Orientation Level: Disoriented to;Time;Place Memory: Decreased short-term memory Following Commands: Follows one step commands with increased time;Follows one step commands inconsistently Safety/Judgement: Decreased awareness of deficits;Decreased awareness of safety Problem Solving: Slow processing;Decreased initiation;Difficulty sequencing;Requires verbal cues;Requires tactile cues General Comments: daughter reports confusion is due to pain medicine     Extremity/Trunk Assessment Upper Extremity Assessment Upper Extremity Assessment: Defer to OT evaluation Lower Extremity Assessment Lower Extremity Assessment: LLE deficits/detail LLE: Unable to fully assess due to pain LLE Sensation:  (decreased to light touch ) Cervical / Trunk Assessment Cervical / Trunk Assessment: Normal   Balance Balance Overall balance assessment: Needs assistance Sitting-balance support: Feet supported;Bilateral upper extremity supported Sitting balance-Leahy Scale: Poor Sitting balance - Comments: leaning posteriorly throughout sitting EOB; requires max cues for hand placement and to maintain balance; unsteady sitting EOB initially; progressed to being able to reach out of BOS; tolerated sitting EOB ~5 min  Postural control: Posterior lean Standing balance support: During functional activity;Bilateral upper extremity supported Standing balance-Leahy Scale: Zero Standing balance comment: unable to maintain balance  without 2 person (A); unable to WB through Lt LE due to pain  General Comments General comments (skin integrity, edema, etc.): surgical incision on Lt groin/ LE area   End of Session PT - End of Session Equipment Utilized During Treatment: Gait belt Activity Tolerance: Patient limited by pain Patient left: in bed;with call bell/phone within reach;with nursing/sitter in room;with family/visitor present Nurse Communication: Mobility status;Precautions  GP     Donell Sievert, [place] [phone] 02/25/2014, 3:16 PM

## 2014-02-26 ENCOUNTER — Encounter (HOSPITAL_COMMUNITY): Payer: Self-pay | Admitting: Vascular Surgery

## 2014-02-26 DIAGNOSIS — Z48812 Encounter for surgical aftercare following surgery on the circulatory system: Secondary | ICD-10-CM

## 2014-02-26 LAB — URINALYSIS, ROUTINE W REFLEX MICROSCOPIC
Bilirubin Urine: NEGATIVE
GLUCOSE, UA: NEGATIVE mg/dL
Ketones, ur: NEGATIVE mg/dL
Nitrite: NEGATIVE
PH: 5.5 (ref 5.0–8.0)
Protein, ur: 100 mg/dL — AB
SPECIFIC GRAVITY, URINE: 1.019 (ref 1.005–1.030)
UROBILINOGEN UA: 0.2 mg/dL (ref 0.0–1.0)

## 2014-02-26 LAB — GLUCOSE, CAPILLARY
GLUCOSE-CAPILLARY: 137 mg/dL — AB (ref 70–99)
Glucose-Capillary: 141 mg/dL — ABNORMAL HIGH (ref 70–99)
Glucose-Capillary: 148 mg/dL — ABNORMAL HIGH (ref 70–99)

## 2014-02-26 LAB — URINE MICROSCOPIC-ADD ON

## 2014-02-26 LAB — PRO B NATRIURETIC PEPTIDE: Pro B Natriuretic peptide (BNP): 4820 pg/mL — ABNORMAL HIGH (ref 0–450)

## 2014-02-26 NOTE — Progress Notes (Addendum)
[facility]  Subjective  - Patient unable to void independently.  I and O cath last night.   Objective 155/66 88 100.5 F (38.1 C) (Oral) 16 99%  Intake/Output Summary (Last 24 hours) at 02/26/14 0910 Last data filed at 02/26/14 0231  Gross per 24 hour  Intake    240 ml  Output    500 ml  Net   -260 ml   Doppler PT signal. Foot nice and warm to touch Distal incision dressing removed Patient could not tolerate any further dressing changes at this time.   ABI'S RIGHT 0.63  LEFT 1.08       Assessment/Planning: POD #2 BYPASS GRAFT FEMORAL-TIBIAL ARTERY (Left)  One unit packed red blood cells for preop anemia Foley cath placed after bladder scan reveals 400 cc  2 person assistance needed for transfers.   Clinton GallantCOLLINS, EMMA [facility] InstituteMAUREEN 02/26/2014 9:10 AM --  Laboratory Lab Results:  Recent Labs  02/24/14 2130 02/25/14 0344  WBC 12.7* 10.8*  HGB 10.4* 9.5*  HCT 31.3* 28.8*  PLT 232 207   BMET  Recent Labs  02/24/14 0325 02/24/14 2130 02/25/14 0344  NA 139  --  138  K 4.2  --  4.6  CL 101  --  103  CO2 25  --  22  GLUCOSE 141*  --  155*  BUN 43*  --  42*  CREATININE 3.53* 3.30* 3.32*  CALCIUM 9.0  --  8.3*    COAG Lab Results  Component Value Date   INR 0.97 02/22/2014   INR 0.92 01/07/2014   No results found for this basename: PTT      I have examined the patient, reviewed and agree with above.  Palp graft pulse.Creat stable  Paelyn Smick, MD 02/26/2014 4:49 PM

## 2014-02-26 NOTE — Progress Notes (Signed)
VASCULAR LAB PRELIMINARY  ARTERIAL  ABI completed:    RIGHT    LEFT    PRESSURE WAVEFORM  PRESSURE WAVEFORM  BRACHIAL Cannot take pressure in this arm per patient Tri BRACHIAL 131 Tri  DP   DP    AT inaudible  AT 43 Damp mono  PT 82 Severe damp mono PT 142 Bi   PER 79 Severe damp mono PER    GREAT TOE  NA GREAT TOE  NA    RIGHT LEFT  ABI 0.63 1.08     Farrel DemarkJill Eunice, RDMS, RVT  02/26/2014, 8:27 AM

## 2014-02-26 NOTE — Progress Notes (Signed)
       Patient Name: Claretta FraiseCorine H Bonneville Date of Encounter: 02/26/2014    SUBJECTIVE: No cardiac complaints  TELEMETRY:  NSR Filed Vitals:   02/25/14 1040 02/25/14 2120 02/26/14 0513 02/26/14 0900  BP: 182/72 120/65 155/66 140/58  Pulse: 72 102 88 83  Temp: 98.6 F (37 C) 99.5 F (37.5 C) 100.5 F (38.1 C)   TempSrc: Oral Oral Oral   Resp: 18 18 16    Height:      Weight:   200 lb 9.9 oz (91 kg)   SpO2: 99% 96% 99%     Intake/Output Summary (Last 24 hours) at 02/26/14 1120 Last data filed at 02/26/14 [id number]  Gross per 24 hour  Intake    480 ml  Output    500 ml  Net    -20 ml    LABS: Basic Metabolic Panel:  Recent Labs  82/95/6202/25/15 0325 02/24/14 2130 02/25/14 0344  NA 139  --  138  K 4.2  --  4.6  CL 101  --  103  CO2 25  --  22  GLUCOSE 141*  --  155*  BUN 43*  --  42*  CREATININE 3.53* 3.30* 3.32*  CALCIUM 9.0  --  8.3*   CBC:  Recent Labs  02/24/14 2130 02/25/14 0344  WBC 12.7* 10.8*  HGB 10.4* 9.5*  HCT 31.3* 28.8*  MCV 89.9 90.6  PLT 232 207     Radiology/Studies:  No new data  Physical Exam: Blood pressure 140/58, pulse 83, temperature 100.5 F (38.1 C), temperature source Oral, resp. rate 16, height 4\' 11"  (1.499 m), weight 200 lb 9.9 oz (91 kg), SpO2 99.00%. Weight change: 5 lb 1.1 oz (2.3 kg)   S4 gallop Chest is clear  ASSESSMENT:  1. CASHD without angina or clinical ischemia 2. Subclinical DHF, stable and asymptomatic.  Plan:  1. Can d/c [facility] at discharge. 2. Will follow as needed.  Selinda EonSigned, Kasheem Toner III,Sonia Stickels W 02/26/2014, 11:20 AM

## 2014-02-26 NOTE — Evaluation (Signed)
Occupational Therapy Evaluation Patient Details Name: Dorothy Bartlett MRN: [id number] DOB: 04/11/1926 Today's Date: 02/26/2014 Time: 4098-1191 OT Time Calculation (min): 28 min  OT Assessment / Plan / Recommendation History of present illness pt is s/p Lt BYPASS GRAFT FEMORAL-TIBIAL ARTERY    Clinical Impression   Pt admitted with above. Will continue to follow pt acutely in order to address below problem list. Recommend SNF for d/c planning in order to maximize independence with ADLs prior to return home.    OT Assessment  Patient needs continued OT Services    Follow Up Recommendations  SNF;Supervision/Assistance - 24 hour    Barriers to Discharge      Equipment Recommendations   (TBD next venue)    Recommendations for Other Services    Frequency  Min 2X/week    Precautions / Restrictions Precautions Precautions: Fall   Pertinent Vitals/Pain See vitals    ADL  ADL Comments: Total assist with ADLs due lethargy and confusion.  Pt with eyes closed much of session. Sat EOB ~10 minutes.    OT Diagnosis: Generalized weakness;Acute pain;Cognitive deficits  OT Problem List: Decreased strength;Decreased activity tolerance;Impaired balance (sitting and/or standing);Decreased cognition;Decreased knowledge of use of DME or AE;Pain OT Treatment Interventions: Self-care/ADL training;DME and/or AE instruction;Therapeutic activities;Patient/family education;Balance training;Cognitive remediation/compensation   OT Goals(Current goals can be found in the care plan section) Acute Rehab OT Goals Patient Stated Goal: none stated OT Goal Formulation: With patient/family Time For Goal Achievement: 03/12/14 Potential to Achieve Goals: Good  Visit Information  Last OT Received On: 02/26/14 Assistance Needed: +2 History of Present Illness: pt is s/p Lt BYPASS GRAFT FEMORAL-TIBIAL ARTERY        Prior Functioning     Home Living Family/patient expects to be discharged to:: Private  residence Living Arrangements: Alone Available Help at Discharge: Family;Available 24 hours/day Type of Home: House Home Access: Stairs to enter [employer] of Steps: 2 Entrance Stairs-Rails: None Home Layout: One level Home Equipment: Walker - 2 wheels;Bedside commode Prior Function Level of Independence: Independent Comments: pt was independent prior to admission; family brings pt food or takes her to grocery store  Communication Communication: HOH Dominant Hand: Right         Vision/Perception     Cognition  Cognition Arousal/Alertness: Lethargic Behavior During Therapy: WFL for tasks assessed/performed Overall Cognitive Status: Impaired/Different from baseline Area of Impairment: Following commands;Problem solving Following Commands: Follows one step commands with increased time Problem Solving: Slow processing;Decreased initiation;Requires verbal cues    Extremity/Trunk Assessment Upper Extremity Assessment Upper Extremity Assessment: Overall WFL for tasks assessed     Mobility Bed Mobility Overal bed mobility: Needs Assistance;+2 for physical assistance Bed Mobility: Sit to Supine;Rolling;Sidelying to Sit Rolling: +2 for physical assistance;Mod assist Sidelying to sit: +2 for physical assistance;Max assist Sit to supine: +2 for physical assistance;Max assist General bed mobility comments: Max verbal cues for sequencing. Assist to support LLE and trunk.     Exercise     Balance Balance Overall balance assessment: Needs assistance Sitting-balance support: Feet supported;Bilateral upper extremity supported Sitting balance-Leahy Scale: Poor Sitting balance - Comments: Pt leaning to right and posteriorly.  Pt eventually able to acheive anterior weight shift and maintain balance with only intermittent min assist. Better balance once she placed right hand in lap rather than pulling on bed rail on right.   End of Session OT - End of Session Equipment  Utilized During Treatment: Oxygen Activity Tolerance: Patient limited by lethargy;Patient limited by pain Patient left: in  bed;with call bell/phone within reach;with family/visitor present Nurse Communication: Mobility status  GO   02/26/2014 Cipriano MileJohnson, Keali Mccraw Elizabeth OTR/L Pager [phone] Office [phone]   Cipriano MileJohnson, Yulieth Carrender Elizabeth 02/26/2014, 3:03 PM

## 2014-02-26 NOTE — Care Management Note (Unsigned)
    Page 1 of 1   02/26/2014     2:31:49 PM   CARE MANAGEMENT NOTE 02/26/2014  Patient:  Dorothy Bartlett,Dorothy Bartlett   Account Number:  [id number]  Date Initiated:  02/23/2014  Documentation initiated by:  [facility]sonBROWN,SARAH  Subjective/Objective Assessment:   Uncontorlled pain and infection from ischemic leg - surgery planned for 2-25.     Action/Plan:   Anticipated DC Date:  03/01/2014   Anticipated DC Plan:  SKILLED NURSING FACILITY  In-house referral  Clinical Social Worker      DC Planning Services  CM consult      Choice offered to / List presented to:             Status of service:  In process, will continue to follow Medicare Important Message given?   (If response is "NO", the following Medicare IM given date fields will be blank) Date Medicare IM given:   Date Additional Medicare IM given:    Discharge Disposition:    Per UR Regulation:  Reviewed for med. necessity/level of care/duration of stay  If discussed at Long Length of Stay Meetings, dates discussed:    Comments:  Contact:   Bartlett,Dorothy Daughter [phone]   [phone]                 Bartlett,Dorothy Son [phone]                 Bartlett,Dorothy Daughter     [phone]                 Haskell FlirtHaywood,Dorothy Grandson     [phone]  02/26/14 Dorothy Parsley,RN,BSN [phone] P.T. RECOMMENDING SNF FOR REHAB AT DC.  CSW CONSULTED TO FACILITATE POSS DC TO SNF WHEN MEDICALLY STABLE FOR DC. WILL FOLLOW PROGRESS.

## 2014-02-26 NOTE — Progress Notes (Signed)
Pt still unable to void RN rescanned. Bladder scan resulted in 499 ml. RN I&O pt per protocol. U/A sent down to lab. Difficult catherization requiring multiple attempts. Pt tolerated procedure well. Will continue to monitor.  Genevive Biavis, Nuria Phebus R, RN

## 2014-02-26 NOTE — Progress Notes (Signed)
Physical Therapy Treatment Patient Details Name: Dorothy FraiseCorine H Bartlett MRN: [id number] DOB: Oct 20, 1926 Today's Date: 02/26/2014 Time: [id number] PT Time Calculation (min): 25 min  PT Assessment / Plan / Recommendation  History of Present Illness pt is s/p Lt BYPASS GRAFT FEMORAL-TIBIAL ARTERY    PT Comments   Patient limited by pain and lethargy.  Somewhat improved sitting balance today.  Continues to be unable to bear weight on LLE due to pain.  Follow Up Recommendations  SNF;Supervision/Assistance - 24 hour     Does the patient have the potential to tolerate intense rehabilitation     Barriers to Discharge        Equipment Recommendations  Other (comment) (TBD at SNF)    Recommendations for Other Services    Frequency Min 2X/week   Progress towards PT Goals Progress towards PT goals: Progressing toward goals  Plan Current plan remains appropriate    Precautions / Restrictions Precautions Precautions: Fall Restrictions Weight Bearing Restrictions: No   Pertinent Vitals/Pain Pain 8/10 LLE    Mobility  Bed Mobility Overal bed mobility: Needs Assistance;+2 for physical assistance Bed Mobility: Sit to Supine;Rolling;Sidelying to Sit Rolling: +2 for physical assistance;Mod assist Sidelying to sit: +2 for physical assistance;Max assist Sit to supine: +2 for physical assistance;Max assist General bed mobility comments: Max verbal cues for sequencing. Assist to support LLE and trunk.  Worked in sitting on sitting balance, activity tolerance x 8 minutes with min guard to min assist. Transfers Overall transfer level:  (unable to tolerate weightbearing LLE)      PT Goals (current goals can now be found in the care plan section) Acute Rehab PT Goals Patient Stated Goal: none stated  Visit Information  Last PT Received On: 02/26/14 Assistance Needed: +2 PT/OT/SLP Co-Evaluation/Treatment: Yes Reason for Co-Treatment: For patient/therapist safety PT goals addressed during session:  Mobility/safety with mobility History of Present Illness: pt is s/p Lt BYPASS GRAFT FEMORAL-TIBIAL ARTERY     Subjective Data  Subjective: Minimal verbalizations.  Says LLE hurts "a whole lot" Patient Stated Goal: none stated   Cognition  Cognition Arousal/Alertness: Lethargic;Suspect due to medications Behavior During Therapy: [facility] for tasks assessed/performed Overall Cognitive Status: Impaired/Different from baseline Area of Impairment: Following commands;Problem solving Following Commands: Follows one step commands with increased time Problem Solving: Slow processing;Decreased initiation;Requires verbal cues    Balance  Balance Overall balance assessment: Needs assistance Sitting-balance support: Bilateral upper extremity supported;Feet supported Sitting balance-Leahy Scale: Poor Sitting balance - Comments: Pt leaning to right and posteriorly.  Pt eventually able to acheive anterior weight shift and maintain balance with only intermittent min assist. Better balance once she placed right hand in lap rather than pulling on bed rail on right.  End of Session PT - End of Session Activity Tolerance: Patient limited by pain;Patient limited by lethargy;Patient limited by fatigue Patient left: in bed;with call bell/phone within reach;with family/visitor present Nurse Communication: Mobility status;Need for lift equipment   GP     Vena AustriaDavis, Dorothy Favor H 02/26/2014, 3:11 PM Durenda HurtSusan H. Renaldo Fiddleravis, PT, [facility] HospitalMBA Acute Rehab Services Pager [phone]

## 2014-02-27 LAB — GLUCOSE, CAPILLARY
GLUCOSE-CAPILLARY: 199 mg/dL — AB (ref 70–99)
GLUCOSE-CAPILLARY: 205 mg/dL — AB (ref 70–99)
Glucose-Capillary: 145 mg/dL — ABNORMAL HIGH (ref 70–99)
Glucose-Capillary: 158 mg/dL — ABNORMAL HIGH (ref 70–99)
Glucose-Capillary: 201 mg/dL — ABNORMAL HIGH (ref 70–99)

## 2014-02-27 LAB — BASIC METABOLIC PANEL
BUN: 51 mg/dL — AB (ref 6–23)
CO2: 21 meq/L (ref 19–32)
CREATININE: 3.4 mg/dL — AB (ref 0.50–1.10)
Calcium: 9.1 mg/dL (ref 8.4–10.5)
Chloride: 101 mEq/L (ref 96–112)
GFR calc non Af Amer: 11 mL/min — ABNORMAL LOW (ref >90)
GFR, EST AFRICAN AMERICAN: 13 mL/min — AB (ref >90)
GLUCOSE: 158 mg/dL — AB (ref 70–99)
POTASSIUM: 5 meq/L (ref 3.7–5.3)
Sodium: 136 mEq/L — ABNORMAL LOW (ref 137–147)

## 2014-02-27 LAB — CBC
HEMATOCRIT: 29.4 % — AB (ref 36.0–46.0)
HEMOGLOBIN: 9.5 g/dL — AB (ref 12.0–15.0)
MCH: 29.6 pg (ref 26.0–34.0)
MCHC: 32.3 g/dL (ref 30.0–36.0)
MCV: 91.6 fL (ref 78.0–100.0)
Platelets: 218 10*3/uL (ref 150–400)
RBC: 3.21 MIL/uL — ABNORMAL LOW (ref 3.87–5.11)
RDW: 13.5 % (ref 11.5–15.5)
WBC: 12.7 10*3/uL — ABNORMAL HIGH (ref 4.0–10.5)

## 2014-02-27 NOTE — Progress Notes (Signed)
Patient oob to chair by maximove, tolerated well. Offered pain medication, pt has declined, states leg does not hurt unless we are touching it. Family at bedside visiting Dorothy Bartlett, Dorothy Bartlett

## 2014-02-27 NOTE — Progress Notes (Signed)
Pt sleepy but arouses easily, tolerated lunch, able to sit on side of bed for breakfast. Instructed on use of incentive spirometry Egbert GaribaldiGlover, Chanan Detwiler A

## 2014-02-27 NOTE — Progress Notes (Signed)
    Subjective  - POD #3, status post left femoral to posterior tibial artery bypass graft with in situ greater saphenous vein  Has not been out of bed. Does not complain of a significant amount of pain   Physical Exam:  Palpable graft pulse Incisions are healing a       Assessment/Plan:  POD #3  Discussed with nursing staff to assist with mobilization the patient.  BRABHAM IV, V. WELLS 02/27/2014 9:54 AM --  Filed Vitals:   02/27/14 0300  BP: 134/60  Pulse: 86  Temp: 100 F (37.8 C)  Resp: 18    Intake/Output Summary (Last 24 hours) at 02/27/14 0954 Last data filed at 02/27/14 0448  Gross per 24 hour  Intake    240 ml  Output   1200 ml  Net   -960 ml     Laboratory CBC    Component Value Date/Time   WBC 12.7* 02/27/2014 0350   WBC 8.4 09/03/2008 1100   HGB 9.5* 02/27/2014 0350   HGB 11.3* 09/03/2008 1100   HCT 29.4* 02/27/2014 0350   HCT 34.3* 09/03/2008 1100   PLT 218 02/27/2014 0350   PLT 146 09/03/2008 1100    BMET    Component Value Date/Time   NA 136* 02/27/2014 0350   K 5.0 02/27/2014 0350   CL 101 02/27/2014 0350   CO2 21 02/27/2014 0350   GLUCOSE 158* 02/27/2014 0350   BUN 51* 02/27/2014 0350   CREATININE 3.40* 02/27/2014 0350   CALCIUM 9.1 02/27/2014 0350   GFRNONAA 11* 02/27/2014 0350   GFRAA 13* 02/27/2014 0350    COAG Lab Results  Component Value Date   INR 0.97 02/22/2014   INR 0.92 01/07/2014   No results found for this basename: PTT    Antibiotics Anti-infectives   Start     Dose/Rate Route Frequency Ordered Stop   02/24/14 1930  cefUROXime (ZINACEF) 1.5 g in dextrose 5 % 50 mL IVPB  Status:  Discontinued     1.5 g 100 mL/hr over 30 Minutes Intravenous Every 12 hours 02/24/14 1848 02/24/14 1928   02/24/14 1930  cefUROXime (ZINACEF) 750 mg in dextrose 5 % 50 mL IVPB     750 mg 100 mL/hr over 30 Minutes Intravenous Every 12 hours 02/24/14 1928 02/25/14 0700   02/24/14 0600  [MAR Hold]  cefUROXime (ZINACEF) 1.5 g in dextrose 5 % 50 mL  IVPB     (On MAR Hold since 02/24/14 1009)   1.5 g 100 mL/hr over 30 Minutes Intravenous On call to O.R. 02/23/14 1219 02/24/14 1156   02/22/14 1609  cefUROXime (ZINACEF) 750 mg in dextrose 5 % 50 mL IVPB  Status:  Discontinued     750 mg 100 mL/hr over 30 Minutes Intravenous Every 12 hours 02/22/14 1609 02/24/14 1823   02/22/14 1530  cefUROXime (ZINACEF) 1.5 g in dextrose 5 % 50 mL IVPB  Status:  Discontinued     1.5 g 100 mL/hr over 30 Minutes Intravenous Every 12 hours 02/22/14 1451 02/22/14 1609       V. Charlena CrossWells Brabham IV, M.D. [facility] [place] Office: [phone] Pager:  [phone]

## 2014-02-28 LAB — TYPE AND SCREEN
ABO/RH(D): AB NEG
Antibody Screen: NEGATIVE
UNIT DIVISION: 0
Unit division: 0

## 2014-02-28 LAB — GLUCOSE, CAPILLARY
GLUCOSE-CAPILLARY: 274 mg/dL — AB (ref 70–99)
GLUCOSE-CAPILLARY: 166 mg/dL — AB (ref 70–99)
Glucose-Capillary: 164 mg/dL — ABNORMAL HIGH (ref 70–99)
Glucose-Capillary: 128 mg/dL — ABNORMAL HIGH (ref 70–99)

## 2014-02-28 NOTE — Progress Notes (Signed)
Clinical Social Work Department BRIEF PSYCHOSOCIAL ASSESSMENT 02/28/2014  Patient:  Dorothy Bartlett, Dorothy Bartlett     Account Number:  [id number]     Admit date:  02/22/2014  Clinical Social Worker:  Hubert Azure  Date/Time:  02/28/2014 05:17 PM  Referred by:  Physician  Date Referred:   Referred for  SNF Placement   Other Referral:   Interview type:  Family Other interview type:    PSYCHOSOCIAL DATA Living Status:  ALONE Admitted from facility:   Level of care:   Primary support name:  [name] Primary support relationship to patient:  CHILD, ADULT Degree of support available:   Good.    CURRENT CONCERNS  Other Concerns:    SOCIAL WORK ASSESSMENT / PLAN Clinical Social Worker (CSW) met with patient to discuss D/C plan. Patient stated she resides alone in [place], but has support from her daughter [name]). Patient expressed no preference in SNF and stated she would speak with her daughter about the options. CSW to continue to be available as needs arise.   Assessment/plan status:  Psychosocial Support/Ongoing Assessment of Needs Other assessment/ plan:   Information/referral to community resources:    PATIENT'S/FAMILY'S RESPONSE TO PLAN OF CARE: Patient was in agreement with D/C plan.

## 2014-02-28 NOTE — Progress Notes (Signed)
Clinical Social Work Department CLINICAL SOCIAL WORK PLACEMENT NOTE 02/28/2014  Patient:  Dorothy Bartlett,Dorothy Bartlett  Account Number:  [id number] Admit date:  02/22/2014  Clinical Social Worker:  Jetta LoutBAILEY MORGAN, Theresia MajorsLCSWA  Date/time:  02/28/2014 03:47 PM  Clinical Social Work is seeking post-discharge placement for this patient at the following level of care:   SKILLED NURSING   (*CSW will update this form in Epic as items are completed)   02/28/2014  Patient/family provided with [facility] Department of Clinical Social Work's list of facilities offering this level of care within the geographic area requested by the patient (or if unable, by the patient's family).  02/28/2014  Patient/family informed of their freedom to choose among providers that offer the needed level of care, that participate in Medicare, Medicaid or managed care program needed by the patient, have an available bed and are willing to accept the patient.  02/28/2014  Patient/family informed of [facility]' ownership interest in [facility], as well as of the fact that they are under no obligation to receive care at this facility.  PASARR submitted to EDS on 02/28/2014 PASARR number received from EDS on 02/28/2014  FL2 transmitted to all facilities in geographic area requested by pt/family on  02/28/2014 FL2 transmitted to all facilities within larger geographic area on   Patient informed that his/her managed care company has contracts with or will negotiate with  certain facilities, including the following:     Patient/family informed of bed offers received:   Patient chooses bed at  Physician recommends and patient chooses bed at    Patient to be transferred to  on   Patient to be transferred to facility by   The following physician request were entered in Epic:   Additional Comments:

## 2014-02-28 NOTE — Progress Notes (Signed)
    Subjective  -POD #4, status post left femoral to posterior tibial artery bypass graft with in situ greater saphenous vein   Refused PT yesterday.   Physical Exam:  I could not feel a graft pulse today, however she had a good Doppler signal over her graft.  Her foot is warm and well perfused.  Her incisions are healing nicely.       Assessment/Plan:  POD #4  I stressed the need for participation with physical therapy.  She needs to get out of bed.  She will likely require discharged to a nursing facility.  BRABHAM IV, V. WELLS 02/28/2014 11:37 AM --  Filed Vitals:   02/28/14 0627  BP: 141/64  Pulse: 83  Temp: 99.7 F (37.6 C)  Resp: 18    Intake/Output Summary (Last 24 hours) at 02/28/14 1137 Last data filed at 02/28/14 1028  Gross per 24 hour  Intake    480 ml  Output   1250 ml  Net   -770 ml     Laboratory CBC    Component Value Date/Time   WBC 12.7* 02/27/2014 0350   WBC 8.4 09/03/2008 1100   HGB 9.5* 02/27/2014 0350   HGB 11.3* 09/03/2008 1100   HCT 29.4* 02/27/2014 0350   HCT 34.3* 09/03/2008 1100   PLT 218 02/27/2014 0350   PLT 146 09/03/2008 1100    BMET    Component Value Date/Time   NA 136* 02/27/2014 0350   K 5.0 02/27/2014 0350   CL 101 02/27/2014 0350   CO2 21 02/27/2014 0350   GLUCOSE 158* 02/27/2014 0350   BUN 51* 02/27/2014 0350   CREATININE 3.40* 02/27/2014 0350   CALCIUM 9.1 02/27/2014 0350   GFRNONAA 11* 02/27/2014 0350   GFRAA 13* 02/27/2014 0350    COAG Lab Results  Component Value Date   INR 0.97 02/22/2014   INR 0.92 01/07/2014   No results found for this basename: PTT    Antibiotics Anti-infectives   Start     Dose/Rate Route Frequency Ordered Stop   02/24/14 1930  cefUROXime (ZINACEF) 1.5 g in dextrose 5 % 50 mL IVPB  Status:  Discontinued     1.5 g 100 mL/hr over 30 Minutes Intravenous Every 12 hours 02/24/14 1848 02/24/14 1928   02/24/14 1930  cefUROXime (ZINACEF) 750 mg in dextrose 5 % 50 mL IVPB     750 mg 100 mL/hr over 30  Minutes Intravenous Every 12 hours 02/24/14 1928 02/25/14 0700   02/24/14 0600  [MAR Hold]  cefUROXime (ZINACEF) 1.5 g in dextrose 5 % 50 mL IVPB     (On MAR Hold since 02/24/14 1009)   1.5 g 100 mL/hr over 30 Minutes Intravenous On call to O.R. 02/23/14 1219 02/24/14 1156   02/22/14 1609  cefUROXime (ZINACEF) 750 mg in dextrose 5 % 50 mL IVPB  Status:  Discontinued     750 mg 100 mL/hr over 30 Minutes Intravenous Every 12 hours 02/22/14 1609 02/24/14 1823   02/22/14 1530  cefUROXime (ZINACEF) 1.5 g in dextrose 5 % 50 mL IVPB  Status:  Discontinued     1.5 g 100 mL/hr over 30 Minutes Intravenous Every 12 hours 02/22/14 1451 02/22/14 1609       V. Charlena CrossWells Brabham IV, M.D. [facility] [place] Office: [phone] Pager:  [phone]

## 2014-03-01 LAB — CREATININE, SERUM
CREATININE: 3.22 mg/dL — AB (ref 0.50–1.10)
GFR calc Af Amer: 14 mL/min — ABNORMAL LOW (ref >90)
GFR calc non Af Amer: 12 mL/min — ABNORMAL LOW (ref >90)

## 2014-03-01 LAB — GLUCOSE, CAPILLARY
GLUCOSE-CAPILLARY: 173 mg/dL — AB (ref 70–99)
Glucose-Capillary: 175 mg/dL — ABNORMAL HIGH (ref 70–99)
Glucose-Capillary: 184 mg/dL — ABNORMAL HIGH (ref 70–99)
Glucose-Capillary: 231 mg/dL — ABNORMAL HIGH (ref 70–99)

## 2014-03-01 NOTE — Progress Notes (Addendum)
[facility] [place]  Subjective  - Doing well no new complaints.   Objective 133/64 74 98 F (36.7 C) (Oral) 18 100%  Intake/Output Summary (Last 24 hours) at 03/01/14 0806 Last data filed at 03/01/14 [id number]  Gross per 24 hour  Intake    600 ml  Output   1300 ml  Net   -700 ml    Left leg incisions healing well Compartments soft Skin warm to touch well perfused  Assessment/Planning: POD # 5 status post left femoral to posterior tibial artery bypass graft with in situ greater saphenous vein One unit packed red blood cells for preop anemia 2 person assistance needed for transfers Pending SNF    Clinton GallantCOLLINS, EMMA [facility] 03/01/2014 8:06 AM --  Laboratory Lab Results:  Recent Labs  02/27/14 0350  WBC 12.7*  HGB 9.5*  HCT 29.4*  PLT 218   BMET  Recent Labs  02/27/14 0350 03/01/14 0412  NA 136*  --   K 5.0  --   CL 101  --   CO2 21  --   GLUCOSE 158*  --   BUN 51*  --   CREATININE 3.40* 3.22*  CALCIUM 9.1  --     COAG Lab Results  Component Value Date   INR 0.97 02/22/2014   INR 0.92 01/07/2014   No results found for this basename: PTT      I have examined the patient, reviewed and agree with above.Easily palp in-situ graft pulse at medial knee.  Agree with SNF prior to home.  Very slow to mobilize  Stefan Markarian, MD 03/01/2014 9:41 AM

## 2014-03-01 NOTE — Progress Notes (Addendum)
Update: CSW received call from patient's daughter and daughter states [facility].  CSW went to speak to patient about bed offers. Patient asked social worker to call patient's daughter Dorothy Bartlett to give her the bed offers. CSW then attempted to call patient's daughter and left voicemail. Awaiting phone call back. FL2 on chart for MD signature.  Maree KrabbeLindsay Astella Desir, MSW, Theresia MajorsLCSWA [phone]

## 2014-03-01 NOTE — Progress Notes (Signed)
Physical Therapy Treatment Patient Details Name: Dorothy FraiseCorine H Schriner MRN: [id number] DOB: 1926/02/19 Today's Date: 03/01/2014 Time: [id number] PT Time Calculation (min): 22 min  PT Assessment / Plan / Recommendation  History of Present Illness pt is s/p Lt BYPASS GRAFT FEMORAL-TIBIAL ARTERY    PT Comments   Pt able to stand today and take a few small steps with SPT from bed > recliner.  Cont to recommend SNF for rehab after d/c from acute care.  Follow Up Recommendations  SNF;Supervision/Assistance - 24 hour     Does the patient have the potential to tolerate intense rehabilitation     Barriers to Discharge        Equipment Recommendations  None recommended by PT    Recommendations for Other Services    Frequency Min 2X/week   Progress towards PT Goals Progress towards PT goals: Progressing toward goals  Plan Current plan remains appropriate    Precautions / Restrictions     Pertinent Vitals/Pain L leg-  FACES 6/10    Mobility  Bed Mobility Overal bed mobility: Needs Assistance;+2 for physical assistance Bed Mobility: Supine to Sit Supine to sit: Min assist Transfers Overall transfer level: Needs assistance Equipment used: Rolling walker (2 wheeled) Transfers: Sit to/from [employer]Stand;Stand Pivot Transfers Sit to Stand: +2 physical assistance;Mod assist Stand pivot transfers: Max assist;+2 physical assistance General transfer comment: Worked on weight shifts in standing in front of bed.  SPT with use of RW and verbal cueing for foot placement. Ambulation/Gait General Gait Details: Pt took a few steps to reclinerwith MAX of 2    Exercises     PT Diagnosis:    PT Problem List:   PT Treatment Interventions:     PT Goals (current goals can now be found in the care plan section) Acute Rehab PT Goals PT Goal Formulation: With patient/family  Visit Information  Last PT Received On: 03/01/14 Assistance Needed: +2 History of Present Illness: pt is s/p Lt BYPASS GRAFT  FEMORAL-TIBIAL ARTERY     Subjective Data  Subjective: Pt supine upon arrival   Cognition  Cognition Arousal/Alertness: Awake/alert Behavior During Therapy: WFL for tasks assessed/performed Overall Cognitive Status: Within Functional Limits for tasks assessed    Balance  Balance Standing balance-Leahy Scale: Poor  End of Session PT - End of Session Equipment Utilized During Treatment: Gait belt Activity Tolerance: Patient limited by pain Patient left: in chair;with call bell/phone within reach Nurse Communication: Mobility status;Need for lift equipment   GP     Briselda Naval LUBECK 03/01/2014, 12:49 PM

## 2014-03-02 ENCOUNTER — Telehealth: Payer: Self-pay | Admitting: Vascular Surgery

## 2014-03-02 LAB — POCT I-STAT 7, (LYTES, BLD GAS, ICA,H+H)
Acid-Base Excess: 2 mmol/L (ref 0.0–2.0)
Bicarbonate: 26.1 mEq/L — ABNORMAL HIGH (ref 20.0–24.0)
Calcium, Ion: 1.11 mmol/L — ABNORMAL LOW (ref 1.13–1.30)
HCT: 27 % — ABNORMAL LOW (ref 36.0–46.0)
Hemoglobin: 9.2 g/dL — ABNORMAL LOW (ref 12.0–15.0)
O2 SAT: 100 %
POTASSIUM: 4 meq/L (ref 3.7–5.3)
SODIUM: 137 meq/L (ref 137–147)
TCO2: 27 mmol/L (ref 0–100)
pCO2 arterial: 36.9 mmHg (ref 35.0–45.0)
pH, Arterial: 7.457 — ABNORMAL HIGH (ref 7.350–7.450)
pO2, Arterial: 375 mmHg — ABNORMAL HIGH (ref 80.0–100.0)

## 2014-03-02 LAB — GLUCOSE, CAPILLARY
Glucose-Capillary: 175 mg/dL — ABNORMAL HIGH (ref 70–99)
Glucose-Capillary: 177 mg/dL — ABNORMAL HIGH (ref 70–99)
Glucose-Capillary: 196 mg/dL — ABNORMAL HIGH (ref 70–99)

## 2014-03-02 MED ORDER — OXYCODONE-ACETAMINOPHEN 5-325 MG PO TABS: 1.0000 | ORAL_TABLET | Freq: Four times a day (QID) | ORAL | Status: DC | PRN

## 2014-03-02 MED ORDER — TRAMADOL HCL 50 MG PO TABS: 50.0000 mg | ORAL_TABLET | Freq: Four times a day (QID) | ORAL | Status: DC | PRN

## 2014-03-02 NOTE — Progress Notes (Addendum)
[facility] [place]  Subjective  - Doing well no new complaints.   Objective 141/58 71 98.7 F (37.1 C) (Oral) 18 96%  Intake/Output Summary (Last 24 hours) at 03/02/14 0744 Last data filed at 03/02/14 0433  Gross per 24 hour  Intake    720 ml  Output   1300 ml  Net   -580 ml    Left foot warm to touch well perfused. All incisions are clean and dry. ABI's Right 0.63  Left 1.08  Assessment/Planning: POD # 6  status post left femoral to posterior tibial artery bypass graft with in situ greater saphenous vein  One unit packed red blood cells for preop anemia  2 person assistance needed for transfers  Pending SNF   Clinton GallantCOLLINS, EMMA [facility] 03/02/2014 7:44 AM --  Laboratory Lab Results: No results found for this basename: WBC, HGB, HCT, PLT,  in the last 72 hours BMET  Recent Labs  03/01/14 0412  CREATININE 3.22*    COAG Lab Results  Component Value Date   INR 0.97 02/22/2014   INR 0.92 01/07/2014   No results found for this basename: PTT      I have examined the patient, reviewed and agree with above. Palp graft pulse. Rest pain resolved.  SNF Kiani Wurtzel, MD 03/02/2014 8:04 AM

## 2014-03-02 NOTE — Progress Notes (Signed)
Report called and given [facility].

## 2014-03-02 NOTE — Progress Notes (Signed)
Clinical Social Work Department CLINICAL SOCIAL WORK PLACEMENT NOTE 03/02/2014  Patient:  Dorothy FraiseDEESE,Dorothy Bartlett  Account Number:  [id number] Admit date:  02/22/2014  Clinical Social Worker:  Jetta LoutBAILEY MORGAN, Theresia MajorsLCSWA  Date/time:  02/28/2014 03:47 PM  Clinical Social Work is seeking post-discharge placement for this patient at the following level of care:   SKILLED NURSING   (*CSW will update this form in Epic as items are completed)   02/28/2014  Patient/family provided with [facility] Department of Clinical Social Work's list of facilities offering this level of care within the geographic area requested by the patient (or if unable, by the patient's family).  02/28/2014  Patient/family informed of their freedom to choose among providers that offer the needed level of care, that participate in Medicare, Medicaid or managed care program needed by the patient, have an available bed and are willing to accept the patient.  02/28/2014  Patient/family informed of [facility]' ownership interest in [facility], as well as of the fact that they are under no obligation to receive care at this facility.  PASARR submitted to EDS on 02/28/2014 PASARR number received from EDS on 02/28/2014  FL2 transmitted to all facilities in geographic area requested by pt/family on  02/28/2014 FL2 transmitted to all facilities within larger geographic area on   Patient informed that his/her managed care company has contracts with or will negotiate with  certain facilities, including the following:     Patient/family informed of bed offers received:  03/01/2014 [facility] Physician recommends and patient chooses bed at    [facility]  03/02/2014 Patient to be transferred to facility by EMS  The following physician request were entered in Epic:   Additional Comments:  Maree KrabbeLindsay Katarina Riebe, MSW, [employer]CSWA [phone]

## 2014-03-02 NOTE — Telephone Encounter (Addendum)
Message copied by Fredrich BirksMILLIKAN, DANA P on Tue Mar 02, 2014  3:26 PM ------      Message from: Sharee PimpleMCCHESNEY, MARILYN K      Created: Tue Mar 02, 2014  7:58 AM      Regarding: schedule                   ----- Message -----         From: Lars MageEmma M Collins, PA-C         Sent: 03/02/2014   7:51 AM           To: [facility] Charge Pool            F/U in 4 weeks with Dr. Arbie CookeyEarly left fem-pop vein in situ. ------  03/02/14: no answer, no voicemail. Mailed letter, dpm

## 2014-03-02 NOTE — Progress Notes (Addendum)
Patient's [facility]. CSW has [facility] and has confirmed a bed for possible dc today.  CSW has attempted to call patient's daughter multiple times and left voicemail's. Awaiting phone call back.  Maree KrabbeLindsay Kelen Laura, MSW, Theresia MajorsLCSWA [phone]

## 2014-03-02 NOTE — Progress Notes (Addendum)
Update: CSW received phone call from patient's daughter. Daughter states she is upset that patient is discharged today "without notice." CSW explained that patient is medically ready today to go to SNF to start physical therapy. Daughter states she will call and set up a time with SNF to do paperwork. CSW was notified by SNF that family is meeting with SNF at 2:30 pm today and then we are able to transport patient. CSW spoke to patient about transportation and patient states she thinks EMS is the safest way to go. CSW will set this up for around 4pm. CSW signing off at this time time.  CSW has attempted to call patient's daughter multiple times regarding dc to SNF today. CSW left numerous messages explaining that patient is being discharged and paper work for the facility needs to be done. CSW explained this to patient, but patient states she wants her daughter present for the paperwork. CSW is awaiting phone call from patient's daughter.  Maree KrabbeLindsay Leandre Wien, MSW, Theresia MajorsLCSWA [phone]

## 2014-03-02 NOTE — Discharge Summary (Signed)
[facility] Discharge Summary   Patient ID:  Dorothy Bartlett MRN: [id number] DOB/AGE: [birth year] 80 y.o.  Admit date: 02/22/2014 Discharge date: 03/02/2014 Date of Surgery: 02/22/2014 - 02/24/2014 Surgeon: Moishe SpiceSurgeon(s): Larina Earthlyodd F Early, MD  Admission Diagnosis: PVD (peripheral vascular disease) [443.9] Ischemic rest pain of lower extremity [443.9]  Discharge Diagnoses:  PVD (peripheral vascular disease) [443.9] Ischemic rest pain of lower extremity [443.9]  Secondary Diagnoses: Past Medical History  Diagnosis Date  . Hypertension   . MI (myocardial infarction)   . Asthma   . Coronary artery disease     a. s/p MI 1995 tx w/ PTCA to Dx, Ramus, ap LAD;  b. LHC (11/2004):  Inferior HK, EF 60%, proximal LAD 50%, ostial D1 80%, mid D1 95%, mid LAD 50-60%, distal LAD at the apex 90%, OM1 occluded (fills late by left to left collaterals), RCA 75%. Medical therapy recommended.;  c. Lexiscan Myoview (12/2013):  Prior ant-lat scar with very small area of peri-infarct ischemia in ant wall, EF 40 (high risk)   . Hx of echocardiogram     a. Echocardiogram (04/2005):EF 55-65%, moderate LVH, mild aortic stenosis (mean gradient 9), moderate LAE.  [name]. Chronic diastolic CHF (congestive heart failure)   . Hyperlipidemia   . PAD (peripheral artery disease)   . Carotid stenosis     Carotid US (12/2013): < 60% RCA, < 40% LICA.  [name]. CKD (chronic kidney disease), stage IV     /notes 02/22/2014; Dr. Hyman HopesWebb  . Type II diabetes mellitus   . Arthritis     "joints" (02/22/2014)    Procedure(s): BYPASS GRAFT FEMORAL-TIBIAL ARTERY  Discharged Condition: good  HPI:  The patient is a very pleasant 80 year old female who presents today with bilateral lower extremity pain. This is much worse on the left leg than the right leg. She has other pain and has been seen in the emergency department for treatment. She has been taking Ultram every 6 hours with some relief. She reports pain specifically in her left  foot extending up onto her calf. She does walk but has difficulty and pain associated with this as well. She did have a possible fungal infection between the web spaces of her toes on the left but this is cleared with over-the-counter Lotrimin. She does have known renal insufficiency and does have a history of coronary artery disease with prior myocardial infarction and coronary angioplasty and stenting.  She underwent Left femoral to posterior tibial bypass with in situ great saphenous vein on 02/24/2014.  She was stable post-op day 1 and transferred to 2W.  She has not ambulated and needed 2 person transfer assistants.  Cardiology followed her while here [facility] without angina or clinical ischemia, Subclinical DHF, stable and asymptomatic.  She had urine retention and a foley was placed.  The foley will be discharged today.  She will go to SNF for further care and rehabilitation.     Hospital Course:  Dorothy Bartlett is a 80 y.o. female is S/P Left Procedure(s): BYPASS GRAFT FEMORAL-TIBIAL ARTERY Extubated: POD # 0 Physical exam: Left foot warm to touch well perfused.  All incisions are clean and dry.  Post-op wounds healing well Pt. Ambulating, voiding and taking PO diet without difficulty. Pt pain controlled with PO pain meds. Labs as below Complications:none  Consults:     Significant Diagnostic Studies: CBC Lab Results  Component Value Date   WBC 12.7* 02/27/2014   HGB 9.5* 02/27/2014   HCT 29.4* 02/27/2014   MCV  91.6 02/27/2014   PLT 218 02/27/2014    BMET    Component Value Date/Time   NA 136* 02/27/2014 0350   K 5.0 02/27/2014 0350   CL 101 02/27/2014 0350   CO2 21 02/27/2014 0350   GLUCOSE 158* 02/27/2014 0350   BUN 51* 02/27/2014 0350   CREATININE 3.22* 03/01/2014 0412   CALCIUM 9.1 02/27/2014 0350   GFRNONAA 12* 03/01/2014 0412   GFRAA 14* 03/01/2014 0412   COAG Lab Results  Component Value Date   INR 0.97 02/22/2014   INR 0.92 01/07/2014     Disposition:  Discharge to  :Skilled nursing facility Discharge Orders   Future Appointments Provider Department Dept Phone   03/16/2014 11:30 AM Larina Earthly, MD [facility] [phone]   04/07/2014 9:30 AM Delmer Islam, RD [facility] [phone]   07/06/2014 9:30 AM Lesleigh Noe, MD [facility] [phone]   Future Orders Complete By Expires   Activity as tolerated - No restrictions  As directed    Call MD for:  redness, tenderness, or signs of infection (pain, swelling, bleeding, redness, odor or green/yellow discharge around incision site)  As directed    Call MD for:  severe or increased pain, loss or decreased feeling  in affected limb(s)  As directed    Call MD for:  temperature >100.5  As directed    May shower   As directed    Resume previous diet  As directed        Medication List         albuterol 108 (90 BASE) MCG/ACT inhaler  Commonly known as:  PROVENTIL HFA;VENTOLIN HFA  Inhale 2 puffs into the lungs every 6 (six) hours as needed for wheezing or shortness of breath.     aspirin 81 MG tablet  Take 81 mg by mouth daily.     atorvastatin 40 MG tablet  Commonly known as:  LIPITOR  40 mg daily.     carvedilol 12.5 MG tablet  Commonly known as:  COREG  Take 1 tablet (12.5 mg total) by mouth 2 (two) times daily.     furosemide 40 MG tablet  Commonly known as:  LASIX  Take 40 mg by mouth daily as needed for fluid.     JANUVIA 25 MG tablet  Generic drug:  sitaGLIPtin  Take 25 mg by mouth daily.     ONETOUCH DELICA LANCETS 33G Misc     ONETOUCH VERIO test strip  Generic drug:  glucose blood     oxyCODONE-acetaminophen 5-325 MG per tablet  Commonly known as:  PERCOCET/ROXICET  Take 1 tablet by mouth every 6 (six) hours as needed for severe pain.     paricalcitol 1 MCG capsule  Commonly known as:  ZEMPLAR  Take 1 mcg by mouth daily.     RENA-VITE RX 1 MG Tabs  Take 1 tablet by mouth  daily.     traMADol 50 MG tablet  Commonly known as:  ULTRAM  Take 1 tablet (50 mg total) by mouth every 6 (six) hours as needed for moderate pain.       Verbal and written Discharge instructions given to the patient. Wound care per Discharge AVS     Follow-up Information   Follow up with EARLY, TODD, MD In 4 weeks. (sent to office)    Specialty:  Vascular Surgery   Contact information:   [address] [place] [address] [phone]  SignedMosetta Pigeon 03/02/2014, 11:28 AM  - For VQI Registry use --- Instructions: Press F2 to tab through selections.  Delete question if not applicable.   Post-op:  Wound infection: No  Graft infection: No  Transfusion: No  If yes,  units given New Arrhythmia: No Ipsilateral amputation: [x ] no, [ ]  Minor, [ ]  BKA, [ ]  AKA Discharge patency: [ ]  Primary, [ ]  Primary assisted, [ ]  Secondary, [ ]  Occluded Patency judged by: [ ]  Dopper only, [ ]  Palpable graft pulse, [ ]  Palpable distal pulse, [ ]  ABI inc. > 0.15, [ ]  Duplex Discharge ABI: R 0.63, L 1.08 D/C Ambulatory Status: Wheelchair  Complications: MI: [x ] No, [ ]  Troponin only, [ ]  EKG or Clinical CHF: No Resp failure: [x ] none, [ ]  Pneumonia, [ ]  Ventilator Chg in renal function: [ ]  none, [x ] Inc. Cr > 0.5, [ ]  Temp. Dialysis, [ ]  Permanent dialysis Stroke: [ x] None, [ ]  Minor, [ ]  Major Return to OR: No  Reason for return to OR: [ ]  Bleeding, [ ]  Infection, [ ]  Thrombosis, [ ]  Revision  Discharge medications: Statin use:  Yes ASA use:  Yes Plavix use:  No  for medical reason   Beta blocker use: Yes Coumadin use: No  for medical reason

## 2014-03-03 ENCOUNTER — Non-Acute Institutional Stay (SKILLED_NURSING_FACILITY): Payer: 59 | Admitting: Adult Health

## 2014-03-03 ENCOUNTER — Encounter: Payer: Self-pay | Admitting: *Deleted

## 2014-03-03 DIAGNOSIS — K5900 Constipation, unspecified: Secondary | ICD-10-CM

## 2014-03-03 DIAGNOSIS — E785 Hyperlipidemia, unspecified: Secondary | ICD-10-CM

## 2014-03-03 DIAGNOSIS — I739 Peripheral vascular disease, unspecified: Secondary | ICD-10-CM

## 2014-03-03 DIAGNOSIS — M79606 Pain in leg, unspecified: Secondary | ICD-10-CM

## 2014-03-03 DIAGNOSIS — E119 Type 2 diabetes mellitus without complications: Secondary | ICD-10-CM

## 2014-03-03 DIAGNOSIS — I10 Essential (primary) hypertension: Secondary | ICD-10-CM

## 2014-03-11 ENCOUNTER — Encounter: Payer: Self-pay | Admitting: Internal Medicine

## 2014-03-11 ENCOUNTER — Non-Acute Institutional Stay (SKILLED_NURSING_FACILITY): Payer: 59 | Admitting: Internal Medicine

## 2014-03-11 DIAGNOSIS — J45909 Unspecified asthma, uncomplicated: Secondary | ICD-10-CM

## 2014-03-11 DIAGNOSIS — E785 Hyperlipidemia, unspecified: Secondary | ICD-10-CM

## 2014-03-11 DIAGNOSIS — I739 Peripheral vascular disease, unspecified: Secondary | ICD-10-CM

## 2014-03-11 DIAGNOSIS — N183 Chronic kidney disease, stage 3 unspecified: Secondary | ICD-10-CM

## 2014-03-11 DIAGNOSIS — I10 Essential (primary) hypertension: Secondary | ICD-10-CM

## 2014-03-11 DIAGNOSIS — I2510 Atherosclerotic heart disease of native coronary artery without angina pectoris: Secondary | ICD-10-CM

## 2014-03-11 DIAGNOSIS — L03116 Cellulitis of left lower limb: Secondary | ICD-10-CM

## 2014-03-11 DIAGNOSIS — L03119 Cellulitis of unspecified part of limb: Secondary | ICD-10-CM

## 2014-03-11 DIAGNOSIS — L02419 Cutaneous abscess of limb, unspecified: Secondary | ICD-10-CM

## 2014-03-11 DIAGNOSIS — E119 Type 2 diabetes mellitus without complications: Secondary | ICD-10-CM

## 2014-03-11 NOTE — Assessment & Plan Note (Signed)
Continue lipitor 40mg  

## 2014-03-11 NOTE — Assessment & Plan Note (Signed)
Pt on januvia only'no prior A1c to see;continue [place]uv[place]

## 2014-03-11 NOTE — Assessment & Plan Note (Signed)
Pt on bblocker, statin ASA

## 2014-03-11 NOTE — Progress Notes (Signed)
MRN: [id number] Name: Dorothy Bartlett  Sex: female Age: 80 y.o. DOB: 1926-08-26  PSC #: [address] Facility/Room: 205 Level Of Care: SNF Provider: Merrilee SeashoreALEXANDER, Davisha Linthicum D Emergency Contacts: Extended Emergency Contact Information Primary Emergency Contact: McCoy,Nancy Address: [address]          [place] [id number] [place] Home Phone: [phone] Mobile Phone: [phone] Relation: Daughter Secondary Emergency Contact: Manske,Chris Address: [address]          [place], [place] [address] [place] Home Phone: [phone] Mobile Phone: [phone] Relation: Son  Code Status: FULL  Allergies: Review of patient's allergies indicates no known allergies.  Chief Complaint  Patient presents with  . nurding home admission    HPI: Patient is 80 y.o. female with PVD s/p fem to tibial on Left who is admitted for support and OT/PT.  Past Medical History  Diagnosis Date  . Hypertension   . MI (myocardial infarction)   . Asthma   . Coronary artery disease     a. s/p MI 1995 tx w/ PTCA to Dx, Ramus, ap LAD;  b. LHC (11/2004):  Inferior HK, EF 60%, proximal LAD 50%, ostial D1 80%, mid D1 95%, mid LAD 50-60%, distal LAD at the apex 90%, OM1 occluded (fills late by left to left collaterals), RCA 75%. Medical therapy recommended.;  c. Lexiscan Myoview (12/2013):  Prior ant-lat scar with very small area of peri-infarct ischemia in ant wall, EF 40 (high risk)   . Hx of echocardiogram     a. Echocardiogram (04/2005):EF 55-65%, moderate LVH, mild aortic stenosis (mean gradient 9), moderate LAE.  [name]. Chronic diastolic CHF (congestive heart failure)   . Hyperlipidemia   . PAD (peripheral artery disease)   . Carotid stenosis     Carotid US (12/2013): < 60% RCA, < 40% LICA.  [name]. CKD (chronic kidney disease), stage IV     /notes 02/22/2014; Dr. Hyman HopesWebb  . Type II diabetes mellitus   . Arthritis     "joints" (02/22/2014)  . PVD (peripheral vascular disease)     Past Surgical  History  Procedure Laterality Date  . Repair of nerve in left arm  Left 2000  . Coronary angioplasty with stent placement  1995  . Femoral-tibial bypass graft Left 02/24/2014    Procedure: BYPASS GRAFT FEMORAL-TIBIAL ARTERY;  Surgeon: Larina Earthlyodd F Early, MD;  [facility];  Service: Vascular;  Laterality: Left;      Medication List       This list is accurate as of: 03/11/14  2:11 PM.  Always use your most recent med list.               albuterol 108 (90 BASE) MCG/ACT inhaler  Commonly known as:  PROVENTIL HFA;VENTOLIN HFA  Inhale 2 puffs into the lungs every 6 (six) hours as needed for wheezing or shortness of breath.     aspirin 81 MG tablet  Take 81 mg by mouth daily.     atorvastatin 40 MG tablet  Commonly known as:  LIPITOR  40 mg daily.     carvedilol 12.5 MG tablet  Commonly known as:  COREG  Take 1 tablet (12.5 mg total) by mouth 2 (two) times daily.     furosemide 40 MG tablet  Commonly known as:  LASIX  Take 40 mg by mouth daily as needed for fluid.     JANUVIA 25 MG tablet  Generic drug:  sitaGLIPtin  Take 25 mg by mouth  daily.     ONETOUCH DELICA LANCETS 33G Misc     ONETOUCH VERIO test strip  Generic drug:  glucose blood     oxyCODONE-acetaminophen 5-325 MG per tablet  Commonly known as:  PERCOCET/ROXICET  Take 1 tablet by mouth every 6 (six) hours as needed for severe pain.     paricalcitol 1 MCG capsule  Commonly known as:  ZEMPLAR  Take 1 mcg by mouth daily.     RENA-VITE RX 1 MG Tabs  Take 1 tablet by mouth daily.     traMADol 50 MG tablet  Commonly known as:  ULTRAM  Take 1 tablet (50 mg total) by mouth every 6 (six) hours as needed for moderate pain.        No orders of the defined types were placed in this encounter.     There is no immunization history on file for this patient.  History  Substance Use Topics  . Smoking status: Never Smoker   . Smokeless tobacco: Never Used  . Alcohol Use: No    Family history is  noncontributory    Review of Systems  DATA OBTAINED: from patient GENERAL: Feels well no fevers, fatigue, appetite changes SKIN: No itching, EYES: No eye pain, redness, discharge EARS: No earache, tinnitus, change in hearing NOSE: No congestion, drainage or bleeding  MOUTH/THROAT: No mouth or tooth pain, No sore throat RESPIRATORY: No cough, wheezing, SOB CARDIAC: No chest pain, palpitations, lower extremity edema  GI: No abdominal pain, No N/V/D or constipation, No heartburn or reflux  GU: No dysuria, frequency or urgency, or incontinence  MUSCULOSKELETAL: No unrelieved bone/joint pain NEUROLOGIC: No headache, dizziness or focal weakness PSYCHIATRIC: No overt anxiety or sadness. Sleeps well. No behavior issue.   Filed Vitals:   03/11/14 1350  BP: 156/74  Pulse: 72  Temp: 99.1 F (37.3 C)  Resp: 18    Physical Exam  GENERAL APPEARANCE: Alert, conversant. Appropriately groomed. No acute distress.  SKIN: No diaphoresis; small blebs adjacent to some areas of incision line with heat detected and pt admits more tender than prior to palpation HEAD: Normocephalic, atraumatic  EYES: Conjunctiva/lids clear. Pupils round, reactive. EOMs intact.  EARS: External exam WNL, canals clear. Hearing grossly normal.  NOSE: No deformity or discharge.  MOUTH/THROAT: Lips w/o lesions. RESPIRATORY: Breathing is even, unlabored. Lung sounds are clear   CARDIOVASCULAR: Heart RRR no murmurs, rubs or gallops. No peripheral edema.  GASTROINTESTINAL: Abdomen is soft, non-tender, not distended w/ normal bowel sounds GENITOURINARY: Bladder non tender, not distended  MUSCULOSKELETAL: No abnormal joints or musculature NEUROLOGIC: Oriented X3. Cranial nerves 2-12 grossly intact. Moves all extremities no tremor. PSYCHIATRIC: Mood and affect appropriate to situation, no behavioral issues  Patient Active Problem List   Diagnosis Date Noted  . Arterial insufficiency of lower extremity 02/22/2014  . PVD  (peripheral vascular disease) 02/16/2014  . Chronic diastolic heart failure 01/15/2014  . CKD (chronic kidney disease) stage 4, GFR 15-29 ml/min 01/15/2014  . Ischemic rest pain of lower extremity 01/07/2014  . Swelling of limb 01/05/2014  . Pain in limb 01/05/2014  . Edema of left foot 12/30/2013  . INTRINSIC ASTHMA, UNSPECIFIED 01/28/2008  . HYPERTENSION 01/13/2008  . DIABETES, TYPE 2 01/12/2008  . HYPERLIPIDEMIA 01/12/2008  . CORONARY HEART DISEASE 01/12/2008  . DYSPNEA 01/12/2008    CBC    Component Value Date/Time   WBC 12.7* 02/27/2014 0350   WBC 8.4 09/03/2008 1100   RBC 3.21* 02/27/2014 0350   RBC 3.67* 09/03/2008 1100  HGB 9.5* 02/27/2014 0350   HGB 11.3* 09/03/2008 1100   HCT 29.4* 02/27/2014 0350   HCT 34.3* 09/03/2008 1100   PLT 218 02/27/2014 0350   PLT 146 09/03/2008 1100   MCV 91.6 02/27/2014 0350   MCV 93.5 09/03/2008 1100   LYMPHSABS 2.4 09/03/2008 1100   MONOABS 0.6 09/03/2008 1100   EOSABS 0.1 09/03/2008 1100   BASOSABS 0.0 09/03/2008 1100    CMP     Component Value Date/Time   NA 136* 02/27/2014 0350   K 5.0 02/27/2014 0350   CL 101 02/27/2014 0350   CO2 21 02/27/2014 0350   GLUCOSE 158* 02/27/2014 0350   BUN 51* 02/27/2014 0350   CREATININE 3.22* 03/01/2014 0412   CALCIUM 9.1 02/27/2014 0350   PROT 6.9 09/03/2008 1100   ALBUMIN 4.1 09/03/2008 1100   AST 21 09/03/2008 1100   ALT 21 09/03/2008 1100   ALKPHOS 83 09/03/2008 1100   BILITOT 0.5 09/03/2008 1100   GFRNONAA 12* 03/01/2014 0412   GFRAA 14* 03/01/2014 0412    Assessment and Plan  Arterial insufficiency of lower extremity S/p L femoral to post tibial bypass with greater saphenous vein 02/24/2014 due to increasing L leg pain; pt admitted for recovery and rehab; today noted blisters along incision line which were noted before but today I felt warmth and pt admits some spots along incision are more tender than before ;early infection; start Doxycycline 100 mg BID for 7 days  DIABETES, TYPE 2 Pt on januvia only'no prior A1c to  see;continue [place]  HYPERTENSION Reasonably controlled on Lasix and coreg;will continue  CORONARY HEART DISEASE Pt on bblocker, statin ASA  INTRINSIC ASTHMA, UNSPECIFIED Stable-prn inhaler  CKD (chronic kidney disease) stage 4, GFR 15-29 ml/min Baseline Cr around 3.2; GFR 13  HYPERLIPIDEMIA Continue lipitor 40 mg  CELLULITIS INCISION- early; start doxycycline 100 mg BID for 7 days  Margit Hanks, MD

## 2014-03-11 NOTE — Assessment & Plan Note (Signed)
Stable-prn inhaler

## 2014-03-11 NOTE — Assessment & Plan Note (Signed)
S/p L femoral to post tibial bypass with greater saphenous vein 02/24/2014 due to increasing L leg pain; pt admitted for recovery and rehab; today noted blisters along incision line which were noted before but today I felt warmth and pt admits some spots along incision are more tender than before ;early infection; start Doxycycline 100 mg BID for 7 days

## 2014-03-11 NOTE — Assessment & Plan Note (Signed)
Baseline Cr around 3.2; GFR 13

## 2014-03-11 NOTE — Assessment & Plan Note (Signed)
Reasonably controlled on Lasix and coreg;will continue

## 2014-03-15 ENCOUNTER — Encounter: Payer: Self-pay | Admitting: Vascular Surgery

## 2014-03-15 ENCOUNTER — Encounter: Payer: Self-pay | Admitting: Adult Health

## 2014-03-15 DIAGNOSIS — K5900 Constipation, unspecified: Secondary | ICD-10-CM | POA: Insufficient documentation

## 2014-03-15 NOTE — Progress Notes (Signed)
Patient ID: Dorothy Bartlett, female   DOB: 05/14/1926, 80 y.o.   MRN: [id number]               PROGRESS NOTE  DATE: 03/03/2014  FACILITY: Nursing Home Location: [facility]  LEVEL OF CARE: SNF (31)  Acute Visit  CHIEF COMPLAINT:  Follow-up Hospitalization  HISTORY OF PRESENT ILLNESS: This is an 80 year old female who [facility] with PVD and Ischemic rest pain of lower extremity.  S/P Left Bypass Graft Femoral-Tibial Artery. She has been admitted for a short-term rehabilitation.  REASSESSMENT OF ONGOING PROBLEM(S):  HTN: Pt 's HTN remains stable.  Denies CP, sob, DOE, pedal edema, headaches, dizziness or visual disturbances.  No complications from the medications currently being used.  Last BP : 134/58  HYPERLIPIDEMIA: No complications from the medications presently being used.   DM:pt's DM remains stable.  Pt denies polyuria, polydipsia, polyphagia, changes in vision or hypoglycemic episodes.  No complications noted from the medication presently being used.   PAST MEDICAL HISTORY : Reviewed.  No changes.  CURRENT MEDICATIONS: [facility]  REVIEW OF SYSTEMS:  GENERAL: no change in appetite, no fatigue, no weight changes, no fever, chills or weakness RESPIRATORY: no cough, SOB, DOE, wheezing, hemoptysis CARDIAC: no chest pain, or palpitations, + edema GI: no abdominal pain, diarrhea, heart burn, nausea or vomiting, +constipation  PHYSICAL EXAMINATION  GENERAL: no acute distress, normal body habitus EYES: conjunctivae normal, sclerae normal, normal eye lids NECK: supple, trachea midline, no neck masses, no thyroid tenderness, no thyromegaly LYMPHATICS: no LAN in the neck, no supraclavicular LAN RESPIRATORY: breathing is even & unlabored, BS CTAB CARDIAC: RRR, no murmur,no extra heart sounds, BLE edema 2+ GI: abdomen soft, normal BS, no masses, no tenderness, no hepatomegaly, no  splenomegaly EXTREMITIES: LLE surgical wound is dry, steri-strips intact PSYCHIATRIC: the patient is alert & oriented to person, affect & behavior appropriate  LABS/RADIOLOGY: Labs reviewed: Basic Metabolic Panel:  Recent Labs  40/98/1102/25/15 0325 02/24/14 1405  02/25/14 0344 02/27/14 0350 03/01/14 0412  NA 139 137  --  138 136*  --   K 4.2 4.0  --  4.6 5.0  --   CL 101  --   --  103 101  --   CO2 25  --   --  22 21  --   GLUCOSE 141*  --   --  155* 158*  --   BUN 43*  --   --  42* 51*  --   CREATININE 3.53*  --   < > 3.32* 3.40* 3.22*  CALCIUM 9.0  --   --  8.3* 9.1  --   < > = values in this interval not displayed.   CBC:  Recent Labs  02/24/14 2130 02/25/14 0344 02/27/14 0350  WBC 12.7* 10.8* 12.7*  HGB 10.4* 9.5* 9.5*  HCT 31.3* 28.8* 29.4*  MCV 89.9 90.6 91.6  PLT 232 207 218   CBG:  Recent Labs  03/02/14 0606 03/02/14 1138 03/02/14 1620  GLUCAP 175* 177* 196*    ASSESSMENT/PLAN:  PVD S/P Left Bypass Graft Femoral-Tibial Artery - for rehabilitation Hyperlipidemia - continue Lipitor Diabetes Mellitus, type 2 - continue Januvia; CBG BID Constipation (new) - start Colace 100 mg PO BID; check HgbA1c next lab draw   CPT CODE: [id number]  Dorothy Bartlett - NP [facility] [phone]

## 2014-03-16 ENCOUNTER — Ambulatory Visit (INDEPENDENT_AMBULATORY_CARE_PROVIDER_SITE_OTHER): Payer: Self-pay | Admitting: Vascular Surgery

## 2014-03-16 ENCOUNTER — Encounter: Payer: Self-pay | Admitting: Vascular Surgery

## 2014-03-16 ENCOUNTER — Ambulatory Visit: Payer: Medicare Other | Admitting: Vascular Surgery

## 2014-03-16 VITALS — BP 172/77 | HR 66 | Resp 16 | Ht 59.0 in | Wt 196.0 lb

## 2014-03-16 DIAGNOSIS — I739 Peripheral vascular disease, unspecified: Secondary | ICD-10-CM

## 2014-03-16 NOTE — Progress Notes (Signed)
Here today for followup of recent left femoral to posterior tibial in situ bypass. He was on 02/24/2014. She had a scheduled appointment in 2 weeks. She presents today with concerns regarding some blistering from the Steri-Strips on her wound. She does look quite good and she reports a marked resolution of her pain. She does have the typical swelling in her left foot following bypass. There is some serous drainage from the lower portion of her calf wound but no evidence of erythema or purulence. She does have a palpable subcutaneous graft pulse at the level of the knee. Her foot is well-perfused in the chronic ischemic changes have resolved. She does have some swelling. I reassured the patient and her daughter that this is simply tape burns from the Steri-Strips. These are quite superficial and are healing.  Impression and plan stable status post left femoral to posterior tibial in situ bypass. She will continue her physical therapy and nursing facility. We will see her again in 4 weeks for continued followup

## 2014-03-30 ENCOUNTER — Encounter: Payer: Medicare Other | Admitting: Vascular Surgery

## 2014-03-31 ENCOUNTER — Encounter: Payer: Self-pay | Admitting: Internal Medicine

## 2014-03-31 ENCOUNTER — Non-Acute Institutional Stay (SKILLED_NURSING_FACILITY): Payer: 59 | Admitting: Internal Medicine

## 2014-03-31 DIAGNOSIS — I10 Essential (primary) hypertension: Secondary | ICD-10-CM

## 2014-03-31 DIAGNOSIS — M7989 Other specified soft tissue disorders: Secondary | ICD-10-CM

## 2014-03-31 DIAGNOSIS — I5032 Chronic diastolic (congestive) heart failure: Secondary | ICD-10-CM

## 2014-03-31 NOTE — Progress Notes (Signed)
This encounter was created in error - please disregard.

## 2014-03-31 NOTE — Assessment & Plan Note (Signed)
Pt's BP is running high and only on coreg-will start hydralazine 10 mg TID

## 2014-03-31 NOTE — Assessment & Plan Note (Addendum)
Pt was SOB this am with O2 sAT 89% ON rA; PT WAS PLACED ON 2l WITH O2 SAT AT 96%; Pt lung sounds  c/w CHF and pt is only on lasix 40 mg PRN which the nurse gave her this am; ordering lasix 60 mg  again now and with Cr 3.2 lasix 80 mg q am for next 5 days; I's and O's and Kcl 40 meq daily  for 5 days. ; BMP Friday to check K+ Pt probably needs to be on lasix daily, not prn

## 2014-03-31 NOTE — Progress Notes (Signed)
MRN: [id number] Name: Dorothy Bartlett  Sex: female Age: 80 y.o. DOB: Mar 30, 1926  [facility] #: [address] Facility/Room: 205 Level Of Care: SNF Provider: Merrilee Seashore D Emergency Contacts: Extended Emergency Contact Information Primary Emergency Contact: McCoy,Nancy Address: [address]          [place] [address] [place] Home Phone: [phone] Mobile Phone: [phone] Relation: Daughter Secondary Emergency Contact: Bean,Chris Address: [address]          [place], [place] [address] [place] Home Phone: [phone] Mobile Phone: [phone] Relation: Son  Code Status: FULL  Allergies: Review of patient's allergies indicates no known allergies.  Chief Complaint  Patient presents with  . Acute Visit    HPI: Patient is 80 y.o. female who nursing asked me to see because she was acutely SOB this am.  Past Medical History  Diagnosis Date  . Hypertension   . MI (myocardial infarction)   . Asthma   . Coronary artery disease     a. s/p MI 1995 tx w/ PTCA to Dx, Ramus, ap LAD;  b. LHC (11/2004):  Inferior HK, EF 60%, proximal LAD 50%, ostial D1 80%, mid D1 95%, mid LAD 50-60%, distal LAD at the apex 90%, OM1 occluded (fills late by left to left collaterals), RCA 75%. Medical therapy recommended.;  c. Lexiscan Myoview (12/2013):  Prior ant-lat scar with very small area of peri-infarct ischemia in ant wall, EF 40 (high risk)   . Hx of echocardiogram     a. Echocardiogram (04/2005):EF 55-65%, moderate LVH, mild aortic stenosis (mean gradient 9), moderate LAE.  [name] Chronic diastolic CHF (congestive heart failure)   . Hyperlipidemia   . PAD (peripheral artery disease)   . Carotid stenosis     Carotid US (12/2013): < 60% RCA, < 40% LICA.  [name] CKD (chronic kidney disease), stage IV     /notes 02/22/2014; Dr. Hyman Hopes  . Type II diabetes mellitus   . Arthritis     "joints" (02/22/2014)  . PVD (peripheral vascular disease)     Past Surgical History  Procedure  Laterality Date  . Repair of nerve in left arm  Left 2000  . Coronary angioplasty with stent placement  1995  . Femoral-tibial bypass graft Left 02/24/2014    Procedure: BYPASS GRAFT FEMORAL-TIBIAL ARTERY;  Surgeon: Larina Earthly, MD;  Location: [facility];  Service: Vascular;  Laterality: Left;      Medication List       This list is accurate as of: 03/31/14  1:13 PM.  Always use your most recent med list.               albuterol 108 (90 BASE) MCG/ACT inhaler  Commonly known as:  PROVENTIL HFA;VENTOLIN HFA  Inhale 2 puffs into the lungs every 6 (six) hours as needed for wheezing or shortness of breath.     aspirin 81 MG tablet  Take 81 mg by mouth daily.     atorvastatin 40 MG tablet  Commonly known as:  LIPITOR  40 mg daily.     carvedilol 12.5 MG tablet  Commonly known as:  COREG  Take 1 tablet (12.5 mg total) by mouth 2 (two) times daily.     furosemide 40 MG tablet  Commonly known as:  LASIX  Take 40 mg by mouth daily as needed for fluid.     JANUVIA 25 MG tablet  Generic drug:  sitaGLIPtin  Take 25 mg by mouth daily.  ONETOUCH DELICA LANCETS 33G Misc     ONETOUCH VERIO test strip  Generic drug:  glucose blood     oxyCODONE-acetaminophen 5-325 MG per tablet  Commonly known as:  PERCOCET/ROXICET  Take 1 tablet by mouth every 6 (six) hours as needed for severe pain.     paricalcitol 1 MCG capsule  Commonly known as:  ZEMPLAR  Take 1 mcg by mouth daily.     RENA-VITE RX 1 MG Tabs  Take 1 tablet by mouth daily.     traMADol 50 MG tablet  Commonly known as:  ULTRAM  Take 1 tablet (50 mg total) by mouth every 6 (six) hours as needed for moderate pain.        No orders of the defined types were placed in this encounter.     There is no immunization history on file for this patient.  History  Substance Use Topics  . Smoking status: Never Smoker   . Smokeless tobacco: Never Used  . Alcohol Use: No    Review of Systems  DATA OBTAINED: from patient,  nurse GENERAL: no fevers, , appetite changes SKIN: No itching, rash HEENT: No complaint RESPIRATORY: No cough, no wheezing, +SOB CARDIAC: No chest pain, palpitations, chronic lower extremity edema  GI: No abdominal pain, No N/V/D or constipation, No heartburn or reflux  GU: No dysuria, frequency or urgency, or incontinence  MUSCULOSKELETAL: No unrelieved bone/joint pain NEUROLOGIC: No headache, dizziness or focal weakness PSYCHIATRIC: No overt anxiety or sadness. Did not sleep well last night because of breathing   Filed Vitals:   03/31/14 1226  BP: 180/100  Pulse: 72  Temp: 98 F (36.7 C)  Resp: 24    Physical Exam  GENERAL APPEARANCE: Alert, conversant. Appropriately groomed  SKIN: No diaphoresis rash,  HEENT: Unremarkable RESPIRATORY: Breathing is even, mild labored. Using abdominal muscles some;  Lung sounds are decreased R base and rales L base ;Pt wants to sit up to breath better;  at bedside per ADAMD O2 sat on 1 liter was 98% with a pulse of 72 ; pt was put back on 2L   Pt wishes to try to diurese here rather than hospital CARDIOVASCULAR: Heart RRR no murmurs, rubs or gallops. 1+ peripheral edema  GASTROINTESTINAL: Abdomen is soft, non-tender, not distended w/ normal bowel sounds.  GENITOURINARY: Bladder non tender, not distended  MUSCULOSKELETAL: No abnormal joints or musculature NEUROLOGIC: Cranial nerves 2-12 grossly intact. Moves all extremities no tremor. PSYCHIATRIC: Mood and affect appropriate to situation, no behavioral issues  Patient Active Problem List   Diagnosis Date Noted  . Constipation 03/15/2014  . Arterial insufficiency of lower extremity 02/22/2014  . PVD (peripheral vascular disease) 02/16/2014  . Chronic diastolic heart failure 01/15/2014  . CKD (chronic kidney disease) stage 4, GFR 15-29 ml/min 01/15/2014  . Ischemic rest pain of lower extremity 01/07/2014  . Swelling of limb 01/05/2014  . Pain in limb 01/05/2014  . Edema of left foot  12/30/2013  . INTRINSIC ASTHMA, UNSPECIFIED 01/28/2008  . HYPERTENSION 01/13/2008  . DIABETES, TYPE 2 01/12/2008  . HYPERLIPIDEMIA 01/12/2008  . CORONARY HEART DISEASE 01/12/2008  . DYSPNEA 01/12/2008    CBC    Component Value Date/Time   WBC 12.7* 02/27/2014 0350   WBC 8.4 09/03/2008 1100   RBC 3.21* 02/27/2014 0350   RBC 3.67* 09/03/2008 1100   HGB 9.5* 02/27/2014 0350   HGB 11.3* 09/03/2008 1100   HCT 29.4* 02/27/2014 0350   HCT 34.3* 09/03/2008 1100   PLT 218  02/27/2014 0350   PLT 146 09/03/2008 1100   MCV 91.6 02/27/2014 0350   MCV 93.5 09/03/2008 1100   LYMPHSABS 2.4 09/03/2008 1100   MONOABS 0.6 09/03/2008 1100   EOSABS 0.1 09/03/2008 1100   BASOSABS 0.0 09/03/2008 1100    CMP     Component Value Date/Time   NA 136* 02/27/2014 0350   K 5.0 02/27/2014 0350   CL 101 02/27/2014 0350   CO2 21 02/27/2014 0350   GLUCOSE 158* 02/27/2014 0350   BUN 51* 02/27/2014 0350   CREATININE 3.22* 03/01/2014 0412   CALCIUM 9.1 02/27/2014 0350   PROT 6.9 09/03/2008 1100   ALBUMIN 4.1 09/03/2008 1100   AST 21 09/03/2008 1100   ALT 21 09/03/2008 1100   ALKPHOS 83 09/03/2008 1100   BILITOT 0.5 09/03/2008 1100   GFRNONAA 12* 03/01/2014 0412   GFRAA 14* 03/01/2014 0412    Assessment and Plan ACUTE EXACERBATION OF Chronic diastolic heart failure Pt was SOB this am with O2 sAT 89% ON rA; PT WAS PLACED ON 2l WITH O2 SAT AT 96%; Pt lung sounds  c/w CHF and pt is only on lasix 40 mg PRN which the nurse gave her this am; ordering lasix 60 mg  again now and with Cr 3.2 lasix 80 mg q am for next 5 days; I's and O's and Kcl 40 meq daily  for 5 days.  Pt probably needs to be on lasix daily, not prn  HYPERTENSION Pt's BP is running high and only on coreg-will start hydralazine 10 mg TID  Swelling of limb Pt always has LE edema- no change and pedal edema can't be used as marker for fluid retention    Margit HanksALEXANDER, Maysa Lynn D, MD

## 2014-03-31 NOTE — Assessment & Plan Note (Signed)
Pt always has LE edema- no change and pedal edema can't be used as marker for fluid retention

## 2014-04-06 ENCOUNTER — Non-Acute Institutional Stay (SKILLED_NURSING_FACILITY): Payer: 59 | Admitting: Adult Health

## 2014-04-06 ENCOUNTER — Telehealth: Payer: Self-pay | Admitting: Interventional Cardiology

## 2014-04-06 ENCOUNTER — Encounter: Payer: Self-pay | Admitting: Adult Health

## 2014-04-06 DIAGNOSIS — G4700 Insomnia, unspecified: Secondary | ICD-10-CM

## 2014-04-06 DIAGNOSIS — E785 Hyperlipidemia, unspecified: Secondary | ICD-10-CM

## 2014-04-06 DIAGNOSIS — E119 Type 2 diabetes mellitus without complications: Secondary | ICD-10-CM

## 2014-04-06 DIAGNOSIS — I739 Peripheral vascular disease, unspecified: Secondary | ICD-10-CM

## 2014-04-06 DIAGNOSIS — I10 Essential (primary) hypertension: Secondary | ICD-10-CM

## 2014-04-06 DIAGNOSIS — K5900 Constipation, unspecified: Secondary | ICD-10-CM

## 2014-04-06 MED ORDER — FUROSEMIDE 40 MG PO TABS: 40.0000 mg | ORAL_TABLET | Freq: Every day | ORAL | Status: DC

## 2014-04-06 NOTE — Telephone Encounter (Signed)
NEW QUESTIONS          Pt's daughter needs clearification on 40 mg Laxis as needed ?   Should pt be take 40 mg of lasiks daily?  Pt has CHF

## 2014-04-06 NOTE — Telephone Encounter (Signed)
returned pt daughter call.pt Lasix dosage confirmed with Dr.Smith.pt should take Lasix 40mg  daily.NOT prn.pt daughter verbalized understanding.

## 2014-04-07 ENCOUNTER — Ambulatory Visit: Payer: Medicare Other | Admitting: Dietician

## 2014-04-07 NOTE — Progress Notes (Signed)
Patient ID: Dorothy Bartlett, female   DOB: [birth year], 80 y.o.   MRN: [id number]                 PROGRESS NOTE  DATE: 04/06/14  FACILITY: Nursing Home Location: [facility]  LEVEL OF CARE: SNF (31)  Acute Visit  CHIEF COMPLAINT:  Discharge Notes  HISTORY OF PRESENT ILLNESS: This is an 80 year old female who is for discharge home with Home health PT, OT, Social worker, CNA and Nursing. She has been admitted to [facility] on 03/02/14 from [facility] with PVD and Ischemic rest pain of lower extremity S/P Left Bypass Graft Femoral-Tibial Artery. Patient was admitted to this facility for short-term rehabilitation after the patient's recent hospitalization.  Patient has completed SNF rehabilitation and therapy has cleared the patient for discharge.   REASSESSMENT OF ONGOING PROBLEM(S):  HTN: Pt 's HTN remains stable.  Denies CP, sob, DOE, pedal edema, headaches, dizziness or visual disturbances.  No complications from the medications currently being used.  Last BP : 132/72  INSOMNIA: The insomnia remains stable.  No complications noted from the medications presently being used. Patient denies ongoing insomnia, pain, hallucinations, delusions.  DM:pt's DM remains stable.  Pt denies polyuria, polydipsia, polyphagia, changes in vision or hypoglycemic episodes.  No complications noted from the medication presently being used.  3/15 HgbA1c 7.2  PAST MEDICAL HISTORY : Reviewed.  No changes.  CURRENT MEDICATIONS: Reviewed per [facility]  REVIEW OF SYSTEMS:  GENERAL: no change in appetite, no fatigue, no weight changes, no fever, chills or weakness RESPIRATORY: no cough, SOB, DOE, wheezing, hemoptysis CARDIAC: no chest pain, or palpitations, + edema GI: no abdominal pain, diarrhea, heart burn, nausea or vomiting, +constipation  PHYSICAL EXAMINATION  GENERAL: no acute distress, normal body habitus NECK: supple, trachea midline, no neck masses, no thyroid tenderness, no  thyromegaly LYMPHATICS: no LAN in the neck, no supraclavicular LAN RESPIRATORY: breathing is even & unlabored, BS CTAB CARDIAC: RRR, no murmur,no extra heart sounds, BLE edema 2+ GI: abdomen soft, normal BS, no masses, no tenderness, no hepatomegaly, no splenomegaly EXTREMITIES: LLE surgical wound is dryPSYCHIATRIC: the patient is alert & oriented to person, affect & behavior appropriate  LABS/RADIOLOGY: 04/02/14 sodium 134 potassium 4.6 glucose 199 BUN 45 creatinine 2.8 calcium 9.1 Labs reviewed: Basic Metabolic Panel:  Recent Labs  78/29/56 0325 02/24/14 1405  02/25/14 0344 02/27/14 0350 03/01/14 0412  NA 139 137  --  138 136*  --   K 4.2 4.0  --  4.6 5.0  --   CL 101  --   --  103 101  --   CO2 25  --   --  22 21  --   GLUCOSE 141*  --   --  155* 158*  --   BUN 43*  --   --  42* 51*  --   CREATININE 3.53*  --   < > 3.32* 3.40* 3.22*  CALCIUM 9.0  --   --  8.3* 9.1  --   < > = values in this interval not displayed.   CBC:  Recent Labs  02/24/14 2130 02/25/14 0344 02/27/14 0350  WBC 12.7* 10.8* 12.7*  HGB 10.4* 9.5* 9.5*  HCT 31.3* 28.8* 29.4*  MCV 89.9 90.6 91.6  PLT 232 207 218   CBG:  Recent Labs  03/02/14 0606 03/02/14 1138 03/02/14 1620  GLUCAP 175* 177* 196*    ASSESSMENT/PLAN:  PVD S/P Left Bypass Graft Femoral-Tibial Artery - for  home health PT, OT, nursing, social worker and CNA Hyperlipidemia - continue Lipitor Diabetes Mellitus, type 2 - continue Januvia and Humalog Constipation - continue Colace Insomnia - continue Melatonin Hypertension - well-controlled; continue Hydralazine and Coreg   I have filled out patient's discharge paperwork and written prescriptions.  Patient will receive home health PT, OT, Nursing, Social worker and CNA.  DME provided: Bedside commode  Total discharge time: Greater than 30 minutes Discharge time involved coordination of the discharge process with social worker, nursing staff and therapy department. Medical  justification for home health services/DME verified.   CPT CODE: [id number]  Ella BodoMonina Vargas - NP [facility] [phone]

## 2014-04-13 ENCOUNTER — Other Ambulatory Visit: Payer: Self-pay

## 2014-04-13 ENCOUNTER — Encounter (HOSPITAL_COMMUNITY): Payer: Self-pay | Admitting: Emergency Medicine

## 2014-04-13 ENCOUNTER — Emergency Department (HOSPITAL_COMMUNITY): Payer: Medicare Other

## 2014-04-13 ENCOUNTER — Inpatient Hospital Stay (HOSPITAL_COMMUNITY)
Admission: EM | Admit: 2014-04-13 | Discharge: 2014-04-18 | DRG: 281 | Disposition: A | Discharge status: Home Health | Payer: Medicare Other | Attending: Internal Medicine | Admitting: Internal Medicine

## 2014-04-13 DIAGNOSIS — Z6839 Body mass index (BMI) 39.0-39.9, adult: Secondary | ICD-10-CM

## 2014-04-13 DIAGNOSIS — Z7982 Long term (current) use of aspirin: Secondary | ICD-10-CM

## 2014-04-13 DIAGNOSIS — I252 Old myocardial infarction: Secondary | ICD-10-CM

## 2014-04-13 DIAGNOSIS — I5043 Acute on chronic combined systolic (congestive) and diastolic (congestive) heart failure: Principal | ICD-10-CM | POA: Diagnosis present

## 2014-04-13 DIAGNOSIS — M129 Arthropathy, unspecified: Secondary | ICD-10-CM | POA: Diagnosis present

## 2014-04-13 DIAGNOSIS — I10 Essential (primary) hypertension: Secondary | ICD-10-CM

## 2014-04-13 DIAGNOSIS — N185 Chronic kidney disease, stage 5: Secondary | ICD-10-CM | POA: Diagnosis present

## 2014-04-13 DIAGNOSIS — I119 Hypertensive heart disease without heart failure: Secondary | ICD-10-CM | POA: Diagnosis present

## 2014-04-13 DIAGNOSIS — N184 Chronic kidney disease, stage 4 (severe): Secondary | ICD-10-CM | POA: Diagnosis present

## 2014-04-13 DIAGNOSIS — I5042 Chronic combined systolic (congestive) and diastolic (congestive) heart failure: Secondary | ICD-10-CM | POA: Diagnosis present

## 2014-04-13 DIAGNOSIS — E785 Hyperlipidemia, unspecified: Secondary | ICD-10-CM

## 2014-04-13 DIAGNOSIS — I2510 Atherosclerotic heart disease of native coronary artery without angina pectoris: Secondary | ICD-10-CM

## 2014-04-13 DIAGNOSIS — I248 Other forms of acute ischemic heart disease: Secondary | ICD-10-CM | POA: Diagnosis present

## 2014-04-13 DIAGNOSIS — I2489 Other forms of acute ischemic heart disease: Secondary | ICD-10-CM | POA: Diagnosis present

## 2014-04-13 DIAGNOSIS — E119 Type 2 diabetes mellitus without complications: Secondary | ICD-10-CM

## 2014-04-13 DIAGNOSIS — R600 Localized edema: Secondary | ICD-10-CM

## 2014-04-13 DIAGNOSIS — J45909 Unspecified asthma, uncomplicated: Secondary | ICD-10-CM | POA: Diagnosis present

## 2014-04-13 DIAGNOSIS — M79609 Pain in unspecified limb: Secondary | ICD-10-CM

## 2014-04-13 DIAGNOSIS — I739 Peripheral vascular disease, unspecified: Secondary | ICD-10-CM | POA: Diagnosis present

## 2014-04-13 DIAGNOSIS — I5033 Acute on chronic diastolic (congestive) heart failure: Secondary | ICD-10-CM

## 2014-04-13 DIAGNOSIS — I214 Non-ST elevation (NSTEMI) myocardial infarction: Secondary | ICD-10-CM | POA: Diagnosis present

## 2014-04-13 DIAGNOSIS — I5032 Chronic diastolic (congestive) heart failure: Secondary | ICD-10-CM

## 2014-04-13 DIAGNOSIS — I509 Heart failure, unspecified: Secondary | ICD-10-CM | POA: Diagnosis present

## 2014-04-13 DIAGNOSIS — E1165 Type 2 diabetes mellitus with hyperglycemia: Secondary | ICD-10-CM | POA: Diagnosis present

## 2014-04-13 DIAGNOSIS — R0602 Shortness of breath: Secondary | ICD-10-CM

## 2014-04-13 DIAGNOSIS — M79606 Pain in leg, unspecified: Secondary | ICD-10-CM

## 2014-04-13 DIAGNOSIS — E118 Type 2 diabetes mellitus with unspecified complications: Secondary | ICD-10-CM

## 2014-04-13 DIAGNOSIS — M7989 Other specified soft tissue disorders: Secondary | ICD-10-CM

## 2014-04-13 DIAGNOSIS — K5900 Constipation, unspecified: Secondary | ICD-10-CM

## 2014-04-13 DIAGNOSIS — Z9861 Coronary angioplasty status: Secondary | ICD-10-CM

## 2014-04-13 DIAGNOSIS — I129 Hypertensive chronic kidney disease with stage 1 through stage 4 chronic kidney disease, or unspecified chronic kidney disease: Secondary | ICD-10-CM | POA: Diagnosis present

## 2014-04-13 DIAGNOSIS — IMO0002 Reserved for concepts with insufficient information to code with codable children: Secondary | ICD-10-CM | POA: Diagnosis present

## 2014-04-13 DIAGNOSIS — D631 Anemia in chronic kidney disease: Secondary | ICD-10-CM | POA: Diagnosis present

## 2014-04-13 DIAGNOSIS — I5023 Acute on chronic systolic (congestive) heart failure: Secondary | ICD-10-CM

## 2014-04-13 DIAGNOSIS — N039 Chronic nephritic syndrome with unspecified morphologic changes: Secondary | ICD-10-CM

## 2014-04-13 DIAGNOSIS — R609 Edema, unspecified: Secondary | ICD-10-CM

## 2014-04-13 HISTORY — DX: Chronic combined systolic (congestive) and diastolic (congestive) heart failure: I50.42

## 2014-04-13 LAB — BASIC METABOLIC PANEL
BUN: 56 mg/dL — ABNORMAL HIGH (ref 6–23)
CALCIUM: 9.9 mg/dL (ref 8.4–10.5)
CO2: 24 meq/L (ref 19–32)
Chloride: 98 mEq/L (ref 96–112)
Creatinine, Ser: 2.78 mg/dL — ABNORMAL HIGH (ref 0.50–1.10)
GFR calc Af Amer: 17 mL/min — ABNORMAL LOW (ref >90)
GFR calc non Af Amer: 14 mL/min — ABNORMAL LOW (ref >90)
GLUCOSE: 229 mg/dL — AB (ref 70–99)
Potassium: 4.5 mEq/L (ref 3.7–5.3)
SODIUM: 140 meq/L (ref 137–147)

## 2014-04-13 LAB — CBC
HEMATOCRIT: 32.3 % — AB (ref 36.0–46.0)
Hemoglobin: 10.1 g/dL — ABNORMAL LOW (ref 12.0–15.0)
MCH: 28.8 pg (ref 26.0–34.0)
MCHC: 31.3 g/dL (ref 30.0–36.0)
MCV: 92 fL (ref 78.0–100.0)
Platelets: 218 10*3/uL (ref 150–400)
RBC: 3.51 MIL/uL — AB (ref 3.87–5.11)
RDW: 14.5 % (ref 11.5–15.5)
WBC: 11 10*3/uL — ABNORMAL HIGH (ref 4.0–10.5)

## 2014-04-13 LAB — PRO B NATRIURETIC PEPTIDE: Pro B Natriuretic peptide (BNP): 24938 pg/mL — ABNORMAL HIGH (ref 0–450)

## 2014-04-13 LAB — TROPONIN I
TROPONIN I: 0.69 ng/mL — AB (ref <0.30)
Troponin I: 0.81 ng/mL (ref <0.30)

## 2014-04-13 LAB — I-STAT TROPONIN, ED: TROPONIN I, POC: 3.81 ng/mL — AB (ref 0.00–0.08)

## 2014-04-13 LAB — GLUCOSE, CAPILLARY: Glucose-Capillary: 213 mg/dL — ABNORMAL HIGH (ref 70–99)

## 2014-04-13 MED ORDER — FUROSEMIDE 10 MG/ML IJ SOLN
60.0000 mg | Freq: Once | INTRAMUSCULAR | Status: AC
  Administered 2014-04-13: 60 mg via INTRAVENOUS
  Filled 2014-04-13: qty 6

## 2014-04-13 MED ORDER — SODIUM CHLORIDE 0.9 % IV SOLN: 250.0000 mL | INTRAVENOUS | Status: DC | PRN

## 2014-04-13 MED ORDER — MELATONIN 3 MG PO TABS: 3.0000 mg | ORAL_TABLET | Freq: Every day | ORAL | Status: DC

## 2014-04-13 MED ORDER — CARVEDILOL 12.5 MG PO TABS
12.5000 mg | ORAL_TABLET | Freq: Two times a day (BID) | ORAL | Status: DC
  Administered 2014-04-13 – 2014-04-18 (×10): 12.5 mg via ORAL
  Filled 2014-04-13 (×11): qty 1

## 2014-04-13 MED ORDER — INSULIN ASPART 100 UNIT/ML ~~LOC~~ SOLN
0.0000 [IU] | Freq: Three times a day (TID) | SUBCUTANEOUS | Status: DC
  Administered 2014-04-14: 3 [IU] via SUBCUTANEOUS

## 2014-04-13 MED ORDER — ASPIRIN 81 MG PO TABS: 81.0000 mg | ORAL_TABLET | Freq: Every morning | ORAL | Status: DC

## 2014-04-13 MED ORDER — RENA-VITE PO TABS
1.0000 | ORAL_TABLET | Freq: Every day | ORAL | Status: DC
  Administered 2014-04-14 – 2014-04-18 (×5): 1 via ORAL
  Filled 2014-04-13 (×5): qty 1

## 2014-04-13 MED ORDER — ALBUTEROL SULFATE HFA 108 (90 BASE) MCG/ACT IN AERS: 2.0000 | INHALATION_SPRAY | Freq: Four times a day (QID) | RESPIRATORY_TRACT | Status: DC | PRN

## 2014-04-13 MED ORDER — HYDRALAZINE HCL 10 MG PO TABS
10.0000 mg | ORAL_TABLET | Freq: Three times a day (TID) | ORAL | Status: DC
  Administered 2014-04-13 – 2014-04-18 (×14): 10 mg via ORAL
  Filled 2014-04-13 (×16): qty 1

## 2014-04-13 MED ORDER — RENA-VITE RX 1 MG PO TABS: 1.0000 | ORAL_TABLET | Freq: Every morning | ORAL | Status: DC

## 2014-04-13 MED ORDER — LINAGLIPTIN 5 MG PO TABS
5.0000 mg | ORAL_TABLET | Freq: Every day | ORAL | Status: DC
Start: 2014-04-14 — End: 2014-04-18
  Administered 2014-04-14 – 2014-04-18 (×5): 5 mg via ORAL
  Filled 2014-04-13 (×5): qty 1

## 2014-04-13 MED ORDER — DOCUSATE SODIUM 100 MG PO CAPS
100.0000 mg | ORAL_CAPSULE | Freq: Two times a day (BID) | ORAL | Status: DC
  Administered 2014-04-13 – 2014-04-18 (×8): 100 mg via ORAL
  Filled 2014-04-13 (×11): qty 1

## 2014-04-13 MED ORDER — ENOXAPARIN SODIUM 30 MG/0.3ML ~~LOC~~ SOLN
30.0000 mg | SUBCUTANEOUS | Status: DC
  Administered 2014-04-13 – 2014-04-17 (×5): 30 mg via SUBCUTANEOUS
  Filled 2014-04-13 (×6): qty 0.3

## 2014-04-13 MED ORDER — ASPIRIN EC 81 MG PO TBEC
81.0000 mg | DELAYED_RELEASE_TABLET | Freq: Every day | ORAL | Status: DC
  Administered 2014-04-14 – 2014-04-18 (×5): 81 mg via ORAL
  Filled 2014-04-13 (×5): qty 1

## 2014-04-13 MED ORDER — ATORVASTATIN CALCIUM 40 MG PO TABS
40.0000 mg | ORAL_TABLET | Freq: Every morning | ORAL | Status: DC
  Administered 2014-04-14 – 2014-04-18 (×5): 40 mg via ORAL
  Filled 2014-04-13 (×5): qty 1

## 2014-04-13 MED ORDER — SODIUM CHLORIDE 0.9 % IJ SOLN: 3.0000 mL | INTRAMUSCULAR | Status: DC | PRN

## 2014-04-13 MED ORDER — SODIUM CHLORIDE 0.9 % IJ SOLN
3.0000 mL | Freq: Two times a day (BID) | INTRAMUSCULAR | Status: DC
  Administered 2014-04-13 – 2014-04-17 (×9): 3 mL via INTRAVENOUS

## 2014-04-13 MED ORDER — FUROSEMIDE 10 MG/ML IJ SOLN
80.0000 mg | Freq: Two times a day (BID) | INTRAMUSCULAR | Status: DC
  Administered 2014-04-13: 80 mg via INTRAVENOUS
  Filled 2014-04-13 (×3): qty 8

## 2014-04-13 NOTE — ED Notes (Signed)
NOTIFIED DR. ZACKOWSKI OF PATIENTS PANIC LAB RESULTS OF I-STAT TROPONIN ,@15 :34 PM ,04/13/2014.

## 2014-04-13 NOTE — ED Notes (Signed)
Talked with IV RN regarding PIV start. IV Rn to come to room for IV start.

## 2014-04-13 NOTE — ED Provider Notes (Addendum)
CSN: [id number]     Arrival date & time 04/13/14  1416 History   First MD Initiated Contact with Patient 04/13/14 1514     Chief Complaint  Patient presents with  . Shortness of Breath     (Consider location/radiation/quality/duration/timing/severity/associated sxs/prior Treatment) Patient is a 80 y.o. female presenting with shortness of breath. The history is provided by the patient and a relative.  Shortness of Breath Severity:  Severe Onset quality:  Gradual Duration:  3 weeks Timing:  Constant Progression:  Worsening Chronicity:  Recurrent Context comment:  States started to feel SOB the last week of rehab and lasix was increased and pt given O2 which improved her sx and then when she went home she has slowly declined Relieved by:  Diuretics, sitting up and oxygen Worsened by:  Activity and exertion (lying flat) Associated symptoms: cough and wheezing   Associated symptoms: no abdominal pain, no chest pain, no diaphoresis, no fever, no sputum production and no vomiting   Risk factors: recent surgery   Risk factors: no hx of PE/DVT and no prolonged immobilization   Risk factors comment:  Recent vascular procedure.  CHF, renal disease and DM   Past Medical History  Diagnosis Date  . Hypertension   . MI (myocardial infarction)   . Asthma   . Coronary artery disease     a. s/p MI 1995 tx w/ PTCA to Dx, Ramus, ap LAD;  b. LHC (11/2004):  Inferior HK, EF 60%, proximal LAD 50%, ostial D1 80%, mid D1 95%, mid LAD 50-60%, distal LAD at the apex 90%, OM1 occluded (fills late by left to left collaterals), RCA 75%. Medical therapy recommended.;  c. Lexiscan Myoview (12/2013):  Prior ant-lat scar with very small area of peri-infarct ischemia in ant wall, EF 40 (high risk)   . Hx of echocardiogram     a. Echocardiogram (04/2005):EF 55-65%, moderate LVH, mild aortic stenosis (mean gradient 9), moderate LAE.  [name]. Chronic diastolic CHF (congestive heart failure)   . Hyperlipidemia   . PAD  (peripheral artery disease)   . Carotid stenosis     Carotid US (12/2013): < 60% RCA, < 40% LICA.  [name]. CKD (chronic kidney disease), stage IV     /notes 02/22/2014; Dr. Hyman HopesWebb  . Type II diabetes mellitus   . Arthritis     "joints" (02/22/2014)  . PVD (peripheral vascular disease)    Past Surgical History  Procedure Laterality Date  . Repair of nerve in left arm  Left 2000  . Coronary angioplasty with stent placement  1995  . Femoral-tibial bypass graft Left 02/24/2014    Procedure: BYPASS GRAFT FEMORAL-TIBIAL ARTERY;  Surgeon: Larina Earthlyodd F Early, MD;  [facility];  Service: Vascular;  Laterality: Left;   Family History  Problem Relation Age of Onset  . Diabetes Father   . Hypertension Father   . Hypertension Daughter   . Hyperlipidemia Daughter   . Hyperlipidemia Son    History  Substance Use Topics  . Smoking status: Never Smoker   . Smokeless tobacco: Never Used  . Alcohol Use: No   OB History   Grav Para Term Preterm Abortions TAB SAB Ect Mult Living                 Review of Systems  Constitutional: Negative for fever and diaphoresis.  Respiratory: Positive for cough, shortness of breath and wheezing. Negative for sputum production.   Cardiovascular: Negative for chest pain.  Gastrointestinal: Negative for vomiting and abdominal pain.  All other systems reviewed and are negative.     Allergies  Review of patient's allergies indicates no known allergies.  Home Medications   Prior to Admission medications   Medication Sig Start Date End Date Taking? Authorizing Provider  albuterol (PROVENTIL HFA;VENTOLIN HFA) 108 (90 BASE) MCG/ACT inhaler Inhale 2 puffs into the lungs every 6 (six) hours as needed for wheezing or shortness of breath.    Historical Provider, MD  aspirin 81 MG tablet Take 81 mg by mouth daily.    Historical Provider, MD  atorvastatin (LIPITOR) 40 MG tablet 40 mg daily. 01/25/14   Historical Provider, MD  B Complex-C-Folic Acid (RENA-VITE RX) 1 MG TABS  Take 1 tablet by mouth daily.  02/05/14   Historical Provider, MD  carvedilol (COREG) 12.5 MG tablet Take 1 tablet (12.5 mg total) by mouth 2 (two) times daily. 02/04/14   Lyn Records III, MD  furosemide (LASIX) 40 MG tablet Take 1 tablet (40 mg total) by mouth daily. 04/06/14   Lyn Records III, MD  JANUVIA 25 MG tablet Take 25 mg by mouth daily.  12/21/13   Historical Provider, MD  Dola Argyle LANCETS 33G MISC  02/02/14   Historical Provider, MD  [name] VERIO test strip  02/02/14   Historical Provider, MD  oxyCODONE-acetaminophen (PERCOCET/ROXICET) 5-325 MG per tablet Take 1 tablet by mouth every 6 (six) hours as needed for severe pain. 03/02/14   Lars Mage, PA-C  paricalcitol (ZEMPLAR) 1 MCG capsule Take 1 mcg by mouth daily.  12/21/13   Historical Provider, MD  traMADol (ULTRAM) 50 MG tablet Take 1 tablet (50 mg total) by mouth every 6 (six) hours as needed for moderate pain. 03/02/14   Lars Mage, PA-C   BP 118/49  Pulse 58  Temp(Src) 98.7 F (37.1 C) (Oral)  Resp 18  SpO2 97% Physical Exam  Nursing note and vitals reviewed. Constitutional: She is oriented to person, place, and time. She appears well-developed and well-nourished. No distress.  HENT:  Head: Normocephalic and atraumatic.  Eyes: EOM are normal. Pupils are equal, round, and reactive to light.  Cardiovascular: Normal rate, regular rhythm, normal heart sounds and intact distal pulses.  Exam reveals no friction rub.   No murmur heard. Pulmonary/Chest: Tachypnea noted. She has decreased breath sounds in the right lower field and the left lower field. She has wheezes. She has rales.  Abdominal: Soft. Bowel sounds are normal. She exhibits no distension. There is no tenderness. There is no rebound and no guarding.  Musculoskeletal: Normal range of motion. She exhibits edema. She exhibits no tenderness.  3+ edema bilaterally.  Mild weeping out of surgical site on the left inner lower leg  Neurological: She is alert and  oriented to person, place, and time. No cranial nerve deficit.  Skin: Skin is warm and dry. No rash noted.  Psychiatric: She has a normal mood and affect. Her behavior is normal.    ED Course  Procedures (including critical care time) Labs Review Labs Reviewed  CBC - Abnormal; Notable for the following:    WBC 11.0 (*)    RBC 3.51 (*)    Hemoglobin 10.1 (*)    HCT 32.3 (*)    All other components within normal limits  BASIC METABOLIC PANEL - Abnormal; Notable for the following:    Glucose, Bld 229 (*)    BUN 56 (*)    Creatinine, Ser 2.78 (*)    GFR calc non Af Amer 14 (*)  GFR calc Af Amer 17 (*)    All other components within normal limits  PRO B NATRIURETIC PEPTIDE - Abnormal; Notable for the following:    Pro B Natriuretic peptide (BNP) 24938.0 (*)    All other components within normal limits  I-STAT TROPOININ, ED - Abnormal; Notable for the following:    Troponin i, poc 3.81 (*)    All other components within normal limits    Imaging Review Dg Chest 2 View  04/13/2014   CLINICAL DATA:  SHORTNESS OF BREATH  EXAM: CHEST  2 VIEW  COMPARISON:  DG CHEST 2 VIEW dated 01/02/2008  FINDINGS: Low lung volumes. Cardiac silhouette is enlarged. There is prominence of the interstitial markings and peribronchial cuffing. Small right pleural effusion is appreciated. Blunting of left costophrenic angle. Mild increased density within the lung bases. Osseous structures unremarkable.  IMPRESSION: Pulmonary edema  Small right effusion, trace left effusion  Bibasilar atelectasis versus infiltrates right greater than left.   Electronically Signed   By: Salome HolmesHector  Cooper M.D.   On: 04/13/2014 15:16     EKG Interpretation   Date/Time:  Tuesday April 13 2014 14:21:38 EDT Ventricular Rate:  63 PR Interval:  206 QRS Duration: 128 QT Interval:  420 QTC Calculation: 429 R Axis:   -19 Text Interpretation:  Normal sinus rhythm with sinus arrhythmia new  Right  bundle branch block Confirmed by  Anitra LauthPLUNKETT  MD, Alphonzo LemmingsWHITNEY ([id number]) on  04/13/2014 3:18:22 PM      MDM   Final diagnoses:  CHF (congestive heart failure)    Pt presenting with hx of CHF, MI, DM and CRI who presents with worsening SOB concerning with CHF with lower ext edema, decreased breath sounds in the bases and rales and weight gain.  Pt taking lasix 40mg  daily but no improvement.   Pt denies any chest pain previously or currently.   Labs consistent with CHF with elevated BNP and heart strain with elevated troponin of 3.8.  Renal function stable at 2.78 and CXR with bilateral effusions and edema.  Pt given IV lasix and will discuss with cardiology.    Gwyneth SproutWhitney Aleaha Fickling, MD 04/13/14 [id number]  Gwyneth SproutWhitney Adaleigh Warf, MD 04/13/14 1606

## 2014-04-13 NOTE — ED Notes (Signed)
Attempted to start PIV albeit success. Paged IV Team for PIV start.

## 2014-04-13 NOTE — ED Notes (Signed)
Pt and daughter report pt developed SOB this am. Pt discharged from hospital a couple of weeks ago and [facility]. Came home this week and has developed SOB. Pt has history of CHF, HTN, DMII. Pt has SOB on exertion and inspiratory and expiratory wheezing at this time.

## 2014-04-13 NOTE — Consult Note (Signed)
Consult Note  Patient name: Dorothy Bartlett MRN: [id number] DOB: 12/31/1926 Sex: female  Consulting Physician:  Hospital Service  Reason for Consult:  Chief Complaint  Patient presents with  . Shortness of Breath    HISTORY OF PRESENT ILLNESS: This is an 80 year old who is status post left femoral to posterior tibial in situ bypass on 02/24/2014 by Dr Early for critical limb ischemia.  She was seen a few weeks ago by Dr Arbie CookeyEarly.  At that time she had some blistering from steri-strips and leg edema with a palpable graft pulse.  She was admitted for shortness of breath, increasing over the past few weeks.  She is followed by Dr. Katrinka BlazingSmith fro CHF.  She is admitted for exacerbation.  She is medically treated for diabetes.  She suffers from stage 4 renal disease.  Her hypertension is controlled on multiple medications.  We have been asked to evaluate her surgical incisions.   Past Medical History  Diagnosis Date  . Hypertension   . MI (myocardial infarction) 1995  . Asthma   . Coronary artery disease     a. s/p MI 1995 tx w/ PTCA to Dx, Ramus, ap LAD;  b. LHC (11/2004):  Inferior HK, EF 60%, proximal LAD 50%, ostial D1 80%, mid D1 95%, mid LAD 50-60%, distal LAD at the apex 90%, OM1 occluded (fills late by left to left collaterals), RCA 75%. Medical therapy recommended.;  c. Lexiscan Myoview (12/2013):  Prior ant-lat scar with very small area of peri-infarct ischemia in ant wall, EF 40 (high risk)   . Hx of echocardiogram   . Chronic diastolic CHF (congestive heart failure)     a. Echocardiogram (04/2005):EF 55-65%, moderate LVH, mild aortic stenosis (mean gradient 9), moderate LAE.  [name]. Hyperlipidemia   . PAD (peripheral artery disease)   . Carotid stenosis     Carotid US (12/2013): < 60% RCA, < 40% LICA.  [name]. CKD (chronic kidney disease), stage IV     notes 02/22/2014; Dr. Hyman HopesWebb  . Type II diabetes mellitus   . Arthritis   . PVD (peripheral vascular disease)     Past Surgical History    Procedure Laterality Date  . Repair of nerve in left arm  Left 2000  . Cardiac catheterization  1995    w/ PTCA Diag 2nd MI  . Femoral-tibial bypass graft Left 02/24/2014    Procedure: BYPASS GRAFT FEMORAL-TIBIAL ARTERY;  Surgeon: Larina Earthlyodd F Early, MD;  [facility];  Service: Vascular;  Laterality: Left;    History   Social History  . Marital Status: Widowed    Spouse Name: N/A    Number of Children: N/A  . Years of Education: N/A   Occupational History  . Retired    Social History Main Topics  . Smoking status: Never Smoker   . Smokeless tobacco: Never Used  . Alcohol Use: No  . Drug Use: No  . Sexual Activity: No   Other Topics Concern  . Not on file   Social History Narrative  . No narrative on file    Family History  Problem Relation Age of Onset  . Diabetes Father   . Hypertension Father   . Hypertension Daughter   . Hyperlipidemia Daughter   . Hyperlipidemia Son     Allergies as of 04/13/2014  . (No Known Allergies)    No current facility-administered medications on file prior to encounter.   Current Outpatient Prescriptions on File  Prior to Encounter  Medication Sig Dispense Refill  . albuterol (PROVENTIL HFA;VENTOLIN HFA) 108 (90 BASE) MCG/ACT inhaler Inhale 2 puffs into the lungs every 6 (six) hours as needed for wheezing or shortness of breath.      [name]. aspirin 81 MG tablet Take 81 mg by mouth every morning.       [name]. atorvastatin (LIPITOR) 40 MG tablet Take 40 mg by mouth every morning.       . B Complex-C-Folic Acid (RENA-VITE RX) 1 MG TABS Take 1 tablet by mouth every morning.       . carvedilol (COREG) 12.5 MG tablet Take 1 tablet (12.5 mg total) by mouth 2 (two) times daily.  180 tablet  1     REVIEW OF SYSTEMS: Please see ROS from admission H&P on 04/13/2014.  There have been no interval changes  PHYSICAL EXAMINATION: General: The patient appears their stated age.  Vital signs are BP 133/50  Pulse 62  Temp(Src) 98.2 F (36.8 C) (Oral)   Resp 22  Ht 4\' 11"  (1.499 m)  Wt 196 lb 6.9 oz (89.1 kg)  BMI 39.65 kg/m2  SpO2 100% Pulmonary: Respirations are non-labored HEENT:  No gross abnormalities Abdomen: Soft and non-tender  Musculoskeletal: There are no major deformities.   Neurologic: No focal weakness or paresthesias are detected, Skin: There are no ulcer or rashes noted. Psychiatric: The patient has normal affect. Cardiovascular: There is a regular rate and rhythm without significant murmur appreciated.  The patient has a palpable bypass graft pulse.  Her incisions have healed nicely with the exception of slight skin separation within the distal incision    Assessment:  S/p left femoral poster tibial bypass with in-situ vein for limb threatening ischemia Plan: The patient has a slight opening of the distal incision in her left leg.  There is no component of infection associated with this.  There is mild drainage.  I would recommend keeping this area dry and covered.  This should heal with time.  Please contact us for further assistance.  The patient has a follow up with Dr. early in the immediate future the     V. Charlena CrossWells Brabham IV, M.D. [facility] Office: [phone] Pager:  [phone]

## 2014-04-13 NOTE — ED Notes (Signed)
Dr. Anitra LauthPlunkett informed of Troponin and BNP results.

## 2014-04-13 NOTE — H&P (Signed)
[facility] History and Physical  BROOKELLE PELLICANE GNF:[id number] DOB: [birth year] DOA: 04/13/2014  Referring physician:  PCP: Pcp Not In System  Specialists: Dr. Garnette Scheuermann, cardiologist  Chief Complaint: Shortness of breath  HPI: Dorothy Bartlett is a 80 y.o. female  With history of CHF, PVD, hypertension, diabetes mellitus that presents to the emergency department for shortness of breath. Her shortness of breath has been increasing over the past 2-3 weeks. She was in the nursing home for rehabilitation for approximately one month after her femoral tibial bypass. Patient was treated for CHF or in the nursing home. She denies any changes to her diet, denies any increased fluid intake or salt in her diet. Patient has been taking her medications as prescribed. Patient has been short of breath with PND and orthopnea as well as with exertion. She has been on nasal cannula since coming to the emergency department. She denies using nasal cannula were CPAP machine at home. Currently she denies any chest pain.  Review of Systems:  Constitutional: Denies fever, chills, diaphoresis, appetite change and fatigue.  HEENT: Denies photophobia, eye pain, redness, hearing loss, ear pain, congestion, sore throat, rhinorrhea, sneezing, mouth sores, trouble swallowing, neck pain, neck stiffness and tinnitus.   Respiratory: Complains of shortness of breath which has worsened over the past 2 weeks. Cardiovascular: Denies chest pain, palpitations and leg swelling.  Gastrointestinal: Denies nausea, vomiting, abdominal pain, diarrhea, constipation, blood in stool and abdominal distention.  Genitourinary: Denies dysuria, urgency, frequency, hematuria, flank pain and difficulty urinating.  Musculoskeletal: Denies myalgias, back pain, joint swelling, arthralgias and gait problem.  Skin: Denies pallor, rash and wound.  Neurological: Denies dizziness, seizures, syncope, weakness, light-headedness, numbness and headaches.   Hematological: Denies adenopathy. Easy bruising, personal or family bleeding history  Psychiatric/Behavioral: Denies suicidal ideation, mood changes, confusion, nervousness, sleep disturbance and agitation  Past Medical History  Diagnosis Date  . Hypertension   . MI (myocardial infarction) 1995  . Asthma   . Coronary artery disease     a. s/p MI 1995 tx w/ PTCA to Dx, Ramus, ap LAD;  b. LHC (11/2004):  Inferior HK, EF 60%, proximal LAD 50%, ostial D1 80%, mid D1 95%, mid LAD 50-60%, distal LAD at the apex 90%, OM1 occluded (fills late by left to left collaterals), RCA 75%. Medical therapy recommended.;  c. Lexiscan Myoview (12/2013):  Prior ant-lat scar with very small area of peri-infarct ischemia in ant wall, EF 40 (high risk)   . Hx of echocardiogram   . Chronic diastolic CHF (congestive heart failure)     a. Echocardiogram (04/2005):EF 55-65%, moderate LVH, mild aortic stenosis (mean gradient 9), moderate LAE.  [name] Hyperlipidemia   . PAD (peripheral artery disease)   . Carotid stenosis     Carotid US (12/2013): < 60% RCA, < 40% LICA.  [name] CKD (chronic kidney disease), stage IV     notes 02/22/2014; Dr. Hyman Hopes  . Type II diabetes mellitus   . Arthritis   . PVD (peripheral vascular disease)    Past Surgical History  Procedure Laterality Date  . Repair of nerve in left arm  Left 2000  . Cardiac catheterization  1995    w/ PTCA Diag 2nd MI  . Femoral-tibial bypass graft Left 02/24/2014    Procedure: BYPASS GRAFT FEMORAL-TIBIAL ARTERY;  Surgeon: Larina Earthly, MD;  Location: [facility] OR;  Service: Vascular;  Laterality: Left;   Social History:  reports that she has never smoked. She has never used smokeless tobacco.  She reports that she does not drink alcohol or use illicit drugs. Recently discharged from nursing home, lives at home.  No Known Allergies  Family History  Problem Relation Age of Onset  . Diabetes Father   . Hypertension Father   . Hypertension Daughter   . Hyperlipidemia  Daughter   . Hyperlipidemia Son     Prior to Admission medications   Medication Sig Start Date End Date Taking? Authorizing Provider  albuterol (PROVENTIL HFA;VENTOLIN HFA) 108 (90 BASE) MCG/ACT inhaler Inhale 2 puffs into the lungs every 6 (six) hours as needed for wheezing or shortness of breath.   Yes Historical Provider, MD  aspirin 81 MG tablet Take 81 mg by mouth every morning.    Yes Historical Provider, MD  atorvastatin (LIPITOR) 40 MG tablet Take 40 mg by mouth every morning.  01/25/14  Yes Historical Provider, MD  B Complex-C-Folic Acid (RENA-VITE RX) 1 MG TABS Take 1 tablet by mouth every morning.  02/05/14  Yes Historical Provider, MD  carvedilol (COREG) 12.5 MG tablet Take 1 tablet (12.5 mg total) by mouth 2 (two) times daily. 02/04/14  Yes Lyn RecordsHenry W Smith III, MD  docusate sodium (COLACE) 100 MG capsule Take 100 mg by mouth 2 (two) times daily.   Yes Historical Provider, MD  furosemide (LASIX) 40 MG tablet Take 40 mg by mouth every morning.    Yes Historical Provider, MD  hydrALAZINE (APRESOLINE) 10 MG tablet Take 10 mg by mouth 3 (three) times daily.   Yes Historical Provider, MD  insulin lispro (HUMALOG) 100 UNIT/ML injection Inject 5 Units into the skin 2 (two) times daily. Only takes if CBG >250.   Yes Historical Provider, MD  Melatonin 3 MG TABS Take 3 mg by mouth at bedtime.    Yes Historical Provider, MD  sitaGLIPtin (JANUVIA) 25 MG tablet Take 25 mg by mouth daily.   Yes Historical Provider, MD   Physical Exam: Filed Vitals:   04/13/14 1812  BP: 120/49  Pulse: 53  Temp:   Resp: 19     General: Well developed, well nourished, NAD, appears stated age  HEENT: NCAT, PERRLA, EOMI, Anicteic Sclera, mucous membranes moist.   Neck: Supple, + JVD, no masses  Cardiovascular: S1 S2 auscultated,1/6SEM. Regular rate and rhythm.  Respiratory: Bibasilar crackles  Abdomen: Soft, nontender, nondistended, + bowel sounds  Extremities: warm dry without cyanosis clubbing.  +2 edema  in LE B/L, LLE wound/incision  Neuro: AAOx3, cranial nerves grossly intact. Strength 5/5 in patient's upper and lower extremities bilaterally  Skin: Without rashes exudates or nodules  Psych: Normal affect and demeanor with intact judgement and insight  Labs on Admission:  Basic Metabolic Panel:  Recent Labs Lab 04/13/14 1444  NA 140  K 4.5  CL 98  CO2 24  GLUCOSE 229*  BUN 56*  CREATININE 2.78*  CALCIUM 9.9   Liver Function Tests: No results found for this basename: AST, ALT, ALKPHOS, BILITOT, PROT, ALBUMIN,  in the last 168 hours No results found for this basename: LIPASE, AMYLASE,  in the last 168 hours No results found for this basename: AMMONIA,  in the last 168 hours CBC:  Recent Labs Lab 04/13/14 1444  WBC 11.0*  HGB 10.1*  HCT 32.3*  MCV 92.0  PLT 218   Cardiac Enzymes: No results found for this basename: CKTOTAL, CKMB, CKMBINDEX, TROPONINI,  in the last 168 hours  BNP (last 3 results)  Recent Labs  02/26/14 1230 04/13/14 1444  PROBNP 4820.0* 24938.0*  CBG: No results found for this basename: GLUCAP,  in the last 168 hours  Radiological Exams on Admission: Dg Chest 2 View  04/13/2014   CLINICAL DATA:  SHORTNESS OF BREATH  EXAM: CHEST  2 VIEW  COMPARISON:  DG CHEST 2 VIEW dated 01/02/2008  FINDINGS: Low lung volumes. Cardiac silhouette is enlarged. There is prominence of the interstitial markings and peribronchial cuffing. Small right pleural effusion is appreciated. Blunting of left costophrenic angle. Mild increased density within the lung bases. Osseous structures unremarkable.  IMPRESSION: Pulmonary edema  Small right effusion, trace left effusion  Bibasilar atelectasis versus infiltrates right greater than left.   Electronically Signed   By: Salome HolmesHector  Cooper M.D.   On: 04/13/2014 15:16    EKG: Independently reviewed. Sinus rhythm, rate 63, first degree AV block, PR 203, right bundle branch block  Assessment/Plan  Dyspnea secondary to Acute on  chronic systolic/diastolic CHF exacerbation, NYHA Class 4 Patient will be admitted to the telemetry unit. Will place her on fluid restriction, heart healthy diet, strict intake and output measurements, daily weights. BNP [id number]. Chest x-ray shows pulmonary edema, small right effusion with trace left effusion, bibasilar atelectasis. Cardiology also consulted and following. Will continue patient on Lasix IV 80 mg twice daily as per cardiology. Patient may need a Lasix drip.  Echocardiogram done on 02/11/2014 shows an EF of 45-50%, mildly reduced systolic function, grade 1 diastolic dysfunction.  NSTEMI Troponin 3.81.  Likely secondary to CHF exacerbation with demand ischemia. Patient currently chest pain-free. Continue to cycle cardiac troponin. EKG did not show any ischemic changes. Continue aspirin, statin, beta blocker. Patient is not on an ACE inhibitor at this time due to renal insufficiency. Cardiology consulted.  Diabetes mellitus Will continue tradjenta, place patient on insulin sliding scale with CBG monitoring. Will also obtain hemoglobin A1c.  Chronic kidney disease stage IV Creatinine appears to be below baseline at 2.78. This will likely worsen as patient will be receiving a Lasix for her CHF exacerbation. Will continue to monitor. Patient may need nephrology consult.  Hypertension Currently controlled. Will continue Coreg and hydralazine, Lasix.  Peripheral vascular disease Recently had a left femoral-tibial bypass. Will consult vascular surgery for wound management.   Anemia Likely secondary to chronic kidney disease. Will continue to monitor her CBC. Patient did not show any evidence of bleeding at this time.  DVT prophylaxis: Lovenox  Code Status: Full  Condition: Guarded  Family Communication: Son at bedside. Admission, patients condition and plan of care including tests being ordered have been discussed with the patient and son who indicate understanding and agree with the  plan and Code Status.  Disposition Plan: Admitted   Time spent: 60 minutes  Bentleigh Waren D.O. [facility] Pager [phone]  If 7PM-7AM, please contact night-coverage [email or web address] Password [email or web address] 04/13/2014, 6:16 PM

## 2014-04-13 NOTE — Consult Note (Signed)
CARDIOLOGY CONSULT   Patient ID: Dorothy Bartlett MRN: [id number] DOB/AGE: 1926-01-31 80 y.o.  Admit date: 04/13/2014  Primary Physician   Pcp Not In System Primary Cardiologist   Dr Katrinka Blazing Reason for Consultation  CHF  [name] is a 80 y.o. female with a history of CAD, PAD, hypertension, and diastolic CHF. She had a recent left fem-tib bypass and was a candidate place for rehabilitation until last week. Her weight when she came home was 196 pounds. She had been seen by Dr. Lyn Hollingshead on 4/01 for shortness of breath and weight gain. She was on an increased dose of Lasix at 80 mg daily.  Since she was discharged from [facility], she has noticed increasing lower extremity edema and weight gain. She has had increasing shortness of breath and dyspnea on exertion. She describes PND and orthopnea. She was weighed several days ago and was 200 pounds. She has gained weight since theN But is not sure how much. Today, she was significantly short of breath at rest. In the emergency room her oxygen saturation is improved on oxygen by nasal cannula. She has not had chest pain.   Past Medical History  Diagnosis Date  . Hypertension   . MI (myocardial infarction) 1995  . Asthma   . Coronary artery disease     a. s/p MI 1995 tx w/ PTCA to Dx, Ramus, ap LAD;  b. LHC (11/2004):  Inferior HK, EF 60%, proximal LAD 50%, ostial D1 80%, mid D1 95%, mid LAD 50-60%, distal LAD at the apex 90%, OM1 occluded (fills late by left to left collaterals), RCA 75%. Medical therapy recommended.;  c. Lexiscan Myoview (12/2013):  Prior ant-lat scar with very small area of peri-infarct ischemia in ant wall, EF 40 (high risk)   . Hx of echocardiogram   . Chronic diastolic CHF (congestive heart failure)     a. Echocardiogram (04/2005):EF 55-65%, moderate LVH, mild aortic stenosis (mean gradient 9), moderate LAE.  [name] Hyperlipidemia   . PAD (peripheral artery disease)   . Carotid stenosis     Carotid US (12/2013):  < 60% RCA, < 40% LICA.  [name] CKD (chronic kidney disease), stage IV     notes 02/22/2014; Dr. Hyman Hopes  . Type II diabetes mellitus   . Arthritis   . PVD (peripheral vascular disease)      Past Surgical History  Procedure Laterality Date  . Repair of nerve in left arm  Left 2000  . Cardiac catheterization  1995    w/ PTCA Diag 2nd MI  . Femoral-tibial bypass graft Left 02/24/2014    Procedure: BYPASS GRAFT FEMORAL-TIBIAL ARTERY;  Surgeon: Larina Earthly, MD;  Location: [facility] OR;  Service: Vascular;  Laterality: Left;    No Known Allergies  I have reviewed the patient's current medications . furosemide  60 mg Intravenous Once   Medication Sig  albuterol (PROVENTIL HFA;VENTOLIN HFA) 108 (90 BASE) MCG/ACT inhaler Inhale 2 puffs into the lungs every 6 (six) hours as needed for wheezing or shortness of breath.  aspirin 81 MG tablet Take 81 mg by mouth daily.  atorvastatin (LIPITOR) 40 MG tablet 40 mg daily.  B Complex-C-Folic Acid (RENA-VITE RX) 1 MG TABS Take 1 tablet by mouth daily.   carvedilol (COREG) 12.5 MG tablet Take 1 tablet (12.5 mg total) by mouth 2 (two) times daily.  furosemide (LASIX) 40 MG tablet Take 1 tablet (40 mg total) by mouth daily.  JANUVIA 25 MG tablet Take 25  mg by mouth daily.   ONETOUCH DELICA LANCETS 33G MISC   ONETOUCH VERIO test strip   oxyCODONE-acetaminophen (PERCOCET/ROXICET) 5-325 MG per tablet Take 1 tablet by mouth every 6 (six) hours as needed for severe pain.  paricalcitol (ZEMPLAR) 1 MCG capsule Take 1 mcg by mouth daily.   traMADol (ULTRAM) 50 MG tablet Take 1 tablet (50 mg total) by mouth every 6 (six) hours as needed for moderate pain.     History   Social History  . Marital Status: Widowed    Spouse Name: N/A    Number of Children: N/A  . Years of Education: N/A   Occupational History  . Retired    Social History Main Topics  . Smoking status: Never Smoker   . Smokeless tobacco: Never Used  . Alcohol Use: No  . Drug Use: No  . Sexual  Activity: No   Other Topics Concern  . Not on file   Social History Narrative  .  she lives with family     Family Status  Relation Status Death Age  . Mother Deceased   . Father Deceased    Family History  Problem Relation Age of Onset  . Diabetes Father   . Hypertension Father   . Hypertension Daughter   . Hyperlipidemia Daughter   . Hyperlipidemia Son      ROS:  Full 14 point review of systems complete and found to be negative unless listed above.  Physical Exam: Blood pressure 118/49, pulse 58, temperature 98.7 F (37.1 C), temperature source Oral, resp. rate 18, SpO2 97.00%.  General: Well developed, well nourished, female in no acute distress Head: Eyes PERRLA, No xanthomas.   Normocephalic and atraumatic, oropharynx without edema or exudate. Dentition: Poor Lungs: Bibasilar crackles Heart: HRRR S1 S2, no rub, S3 gallop,  soft murmur. pulses are 2+ upper extrem.   Neck: No carotid bruits. No lymphadenopathy.  JVD 12 cm Abdomen: Bowel sounds present, abdomen soft and non-tender without masses or hernias noted. Msk:  No spine or cva tenderness. No weakness, no joint deformities or effusions. Extremities: No clubbing or cyanosis. 2+ edema.  Neuro: Alert and oriented X 3. No focal deficits noted. Psych:  Good affect, responds appropriately Skin: No rashes or lesions noted. Incision on left lower extremity has had serous drainage since the surgery. However today, the daughter noticed there was a greenish tinge.  Labs:   Lab Results  Component Value Date   WBC 11.0* 04/13/2014   HGB 10.1* 04/13/2014   HCT 32.3* 04/13/2014   MCV 92.0 04/13/2014   PLT 218 04/13/2014     Recent Labs Lab 04/13/14 1444  NA 140  K 4.5  CL 98  CO2 24  BUN 56*  CREATININE 2.78*  CALCIUM 9.9  GLUCOSE 229*    Recent Labs  04/13/14 1506  TROPIPOC 3.81*   Pro B Natriuretic peptide (BNP)  Date/Time Value Ref Range Status  04/13/2014  2:44 PM 24938.0* 0 - 450 pg/mL Final  02/26/2014  12:30 PM 4820.0* 0 - 450 pg/mL Final   Echo: 02/11/2014 Study Conclusions - Left ventricle: The cavity size was normal. Wall thickness was increased in a pattern of mild LVH. Systolic function was mildly reduced. The estimated ejection fraction was in the range of 45% to 50%. There is hypokinesis of the inferior myocardium. Doppler parameters are consistent with abnormal left ventricular relaxation (grade 1 diastolic dysfunction). Doppler parameters are consistent with high ventricular filling pressure. - Aortic valve: Mean gradient: 8mm Hg (  S). Peak gradient: 14mm Hg (S). - Mitral valve: Mild regurgitation. - Left atrium: The atrium was mildly dilated. Anterior-posterior dimension: 44mm (2D). - Pulmonary arteries: Systolic pressure was mildly increased. PA peak pressure: 41mm Hg (S).  ECG: 04/13/2014 Sinus rhythm, right bundle branch block (old) No acute ischemic changes    Radiology:  Dg Chest 2 View 04/13/2014   CLINICAL DATA:  SHORTNESS OF BREATH  EXAM: CHEST  2 VIEW  COMPARISON:  DG CHEST 2 VIEW dated 01/02/2008  FINDINGS: Low lung volumes. Cardiac silhouette is enlarged. There is prominence of the interstitial markings and peribronchial cuffing. Small right pleural effusion is appreciated. Blunting of left costophrenic angle. Mild increased density within the lung bases. Osseous structures unremarkable.  IMPRESSION: Pulmonary edema  Small right effusion, trace left effusion  Bibasilar atelectasis versus infiltrates right greater than left.   Electronically Signed   By: Salome HolmesHector  Cooper M.D.   On: 04/13/2014 15:16    ASSESSMENT AND PLAN:   The patient was seen today by Dr. Eden EmmsNishan, the patient evaluated and the data reviewed.  Principal Problem:   Acute on chronic diastolic CHF (congestive heart failure), NYHA class 4 - we will start with Lasix 80 mg IV twice a day and assess response. She may need higher dose Lasix or Lasix drip. It will be beneficial to obtain a renal consult and get  their input on managing her volume status.   Active Problems:   Non-ST elevation myocardial infarction (NSTEMI), initial episode of care - she has known significant coronary artery disease, treated medically. She has not had chest pain and her ECG does not have acute ischemic changes. We'll continue to cycle enzymes and add heparin. Continue aspirin, statin and beta blocker. With significant renal insufficiency, cath would be high risk.    DIABETES, TYPE 2 - per IM    HYPERTENSION - per IM, should improve with diuresis    CKD (chronic kidney disease) stage 4, GFR 15-29 ml/min - recommend renal consult, recurrent BUN and creatinine are at or close to baseline.    PVD (peripheral vascular disease) - will likely need VVS input regarding wound management, may need antibiotics.  We will be happy to consult and continue to follow this patient during her hospital stay.  Signed: Darrol JumpRhonda G Barrett, PA-C 04/13/2014 5:04 PM Beeper [phone]  Co-Sign MD  Patient examined chart reviewed Case discussed with family.  He has worsening renal failure Post PV surgery with poorly healing LE.  EF is normal  Minimal AS  Would have Dr Early service see regarding weeping LE wound  IV diuretics  Have renal evaluate and titrate diuretics  She sees Dr Hyman HopesWebb.  She has indicated not wanting dialysis  She is very frail with poor respitory effort.  Needs foley and likely need iv to start PIC or iv line  Wendall StadePeter C Cashus Halterman

## 2014-04-13 NOTE — ED Notes (Addendum)
Reports having vascular surgery to left leg recently. Having sob, non productive cough and swelling, hx of chf. spo2 97% at triage.

## 2014-04-14 DIAGNOSIS — I2510 Atherosclerotic heart disease of native coronary artery without angina pectoris: Secondary | ICD-10-CM

## 2014-04-14 LAB — URINALYSIS, ROUTINE W REFLEX MICROSCOPIC
BILIRUBIN URINE: NEGATIVE
Glucose, UA: NEGATIVE mg/dL
KETONES UR: NEGATIVE mg/dL
Nitrite: NEGATIVE
PROTEIN: 30 mg/dL — AB
Specific Gravity, Urine: 1.014 (ref 1.005–1.030)
Urobilinogen, UA: 0.2 mg/dL (ref 0.0–1.0)
pH: 5 (ref 5.0–8.0)

## 2014-04-14 LAB — BASIC METABOLIC PANEL
BUN: 59 mg/dL — ABNORMAL HIGH (ref 6–23)
CO2: 27 mEq/L (ref 19–32)
Calcium: 9.6 mg/dL (ref 8.4–10.5)
Chloride: 101 mEq/L (ref 96–112)
Creatinine, Ser: 3.05 mg/dL — ABNORMAL HIGH (ref 0.50–1.10)
GFR calc Af Amer: 15 mL/min — ABNORMAL LOW (ref >90)
GFR calc non Af Amer: 13 mL/min — ABNORMAL LOW (ref >90)
Glucose, Bld: 162 mg/dL — ABNORMAL HIGH (ref 70–99)
POTASSIUM: 4.1 meq/L (ref 3.7–5.3)
SODIUM: 143 meq/L (ref 137–147)

## 2014-04-14 LAB — URINE MICROSCOPIC-ADD ON

## 2014-04-14 LAB — GLUCOSE, CAPILLARY
GLUCOSE-CAPILLARY: 152 mg/dL — AB (ref 70–99)
GLUCOSE-CAPILLARY: 147 mg/dL — AB (ref 70–99)
Glucose-Capillary: 169 mg/dL — ABNORMAL HIGH (ref 70–99)
Glucose-Capillary: 157 mg/dL — ABNORMAL HIGH (ref 70–99)

## 2014-04-14 LAB — TROPONIN I
TROPONIN I: 0.73 ng/mL — AB (ref <0.30)
TROPONIN I: 0.69 ng/mL — AB (ref <0.30)

## 2014-04-14 LAB — HEMOGLOBIN A1C
HEMOGLOBIN A1C: 7.6 % — AB (ref <5.7)
Mean Plasma Glucose: 171 mg/dL — ABNORMAL HIGH (ref <117)

## 2014-04-14 MED ORDER — ALBUTEROL SULFATE (2.5 MG/3ML) 0.083% IN NEBU
2.5000 mg | INHALATION_SOLUTION | RESPIRATORY_TRACT | Status: DC | PRN
  Administered 2014-04-14: 2.5 mg via RESPIRATORY_TRACT

## 2014-04-14 MED ORDER — FUROSEMIDE 40 MG PO TABS: 40.0000 mg | ORAL_TABLET | Freq: Every morning | ORAL | Status: DC

## 2014-04-14 MED ORDER — ALBUTEROL SULFATE (2.5 MG/3ML) 0.083% IN NEBU
INHALATION_SOLUTION | RESPIRATORY_TRACT | Status: AC
  Administered 2014-04-14: 2.5 mg via RESPIRATORY_TRACT
  Filled 2014-04-14: qty 3

## 2014-04-14 MED ORDER — INSULIN ASPART 100 UNIT/ML ~~LOC~~ SOLN
3.0000 [IU] | Freq: Three times a day (TID) | SUBCUTANEOUS | Status: DC
  Administered 2014-04-14 – 2014-04-18 (×9): 3 [IU] via SUBCUTANEOUS

## 2014-04-14 MED ORDER — METOLAZONE 2.5 MG PO TABS: 2.5000 mg | ORAL_TABLET | Freq: Two times a day (BID) | ORAL | Status: DC

## 2014-04-14 MED ORDER — FUROSEMIDE 10 MG/ML IJ SOLN
80.0000 mg | Freq: Three times a day (TID) | INTRAMUSCULAR | Status: DC
  Administered 2014-04-14 – 2014-04-15 (×3): 80 mg via INTRAVENOUS
  Filled 2014-04-14 (×3): qty 8

## 2014-04-14 MED ORDER — INSULIN ASPART 100 UNIT/ML ~~LOC~~ SOLN: 0.0000 [IU] | Freq: Every day | SUBCUTANEOUS | Status: DC

## 2014-04-14 MED ORDER — DEXTROSE 5 % IV SOLN
1.0000 g | INTRAVENOUS | Status: DC
  Administered 2014-04-15 – 2014-04-17 (×3): 1 g via INTRAVENOUS
  Filled 2014-04-14 (×4): qty 10

## 2014-04-14 MED ORDER — METOLAZONE 2.5 MG PO TABS
2.5000 mg | ORAL_TABLET | Freq: Two times a day (BID) | ORAL | Status: AC
  Administered 2014-04-14 (×2): 2.5 mg via ORAL
  Filled 2014-04-14 (×2): qty 1

## 2014-04-14 MED ORDER — FUROSEMIDE 10 MG/ML IJ SOLN: 80.0000 mg | Freq: Two times a day (BID) | INTRAMUSCULAR | Status: DC

## 2014-04-14 MED ORDER — INSULIN ASPART 100 UNIT/ML ~~LOC~~ SOLN
0.0000 [IU] | Freq: Three times a day (TID) | SUBCUTANEOUS | Status: DC
  Administered 2014-04-14 – 2014-04-15 (×3): 2 [IU] via SUBCUTANEOUS
  Administered 2014-04-15: 1 [IU] via SUBCUTANEOUS
  Administered 2014-04-16: 3 [IU] via SUBCUTANEOUS
  Administered 2014-04-16: 1 [IU] via SUBCUTANEOUS
  Administered 2014-04-16: 5 [IU] via SUBCUTANEOUS
  Administered 2014-04-17: 1 [IU] via SUBCUTANEOUS
  Administered 2014-04-17: 2 [IU] via SUBCUTANEOUS

## 2014-04-14 NOTE — Care Management Note (Addendum)
  Page 2 of 2   04/19/2014     8:57:22 AM CARE MANAGEMENT NOTE 04/19/2014  Patient:  Claretta FraiseDEESE,Alena H   Account Number:  [id number]  Date Initiated:  04/14/2014  Documentation initiated by:  Chaseton Yepiz  Subjective/Objective Assessment:   Admitted with CHF     Action/Plan:   CM to follow for dispositon needs   Anticipated DC Date:  04/17/2014   Anticipated DC Plan:  HOME W HOME HEALTH SERVICES      DC Planning Services  CM consult      [facility]   Choice offered to / List presented to:  NA        HH arranged  HH-1 RN      Status of service:  Completed, signed off Medicare Important Message given?   (If response is "NO", the following Medicare IM given date fields will be blank) Date Medicare IM given:   Date Additional Medicare IM given:    Discharge Disposition:    Per UR Regulation:  Reviewed for med. necessity/level of care/duration of stay  If discussed at Long Length of Stay Meetings, dates discussed:    Comments:  04/19/2014 D/c on 04/18/2014 Home / HHS resumed with [facility] Julyan Gales RN, BSN, MSHL, CCM 04/19/2014   04/15/2014 PT RECS:  [facility]NONE DME RECS: None Disposition Plan:  Resume HHS: RN Donato Schultzrystal Alexica Schlossberg RN, BSN, MSHL, CCM 04/15/2014  Social:  From home alone.  Difficult ambulation d/t edematous legs.  Hx/o has refused HD in the past. Patient with increased BUN/Creat and decreased output this admission. [facility] Contact Olegario MessierKathy [phone] PT Eval Pending Dispositon Plan Resume HHS Salar Molden RN, BSN, [place], [place] 04/14/2014

## 2014-04-14 NOTE — Progress Notes (Signed)
Chart review complete.  Patient is not eligible for [facility] Care Management services because his/her PCP is not a [facility] primary care provider or is not [facility] affiliated.  For any additional questions or new referrals please contact Tim Henderson BSN RN [facility] Liaison at [phone] °

## 2014-04-14 NOTE — Progress Notes (Signed)
Patient with noticeable sediment in urine, urine a dark tinged color.  NP on call made aware.  UA to be collected. RN will continue to monitor. Louretta ParmaAshley Elchanan Bob, RN

## 2014-04-14 NOTE — Progress Notes (Signed)
80yo female admitted yesterday for SOB, now w/ noticeable sediment in dark-colored urine, UA abnormal, Cx pending, to begin IV ABX.  Will start Rocephin 1g IV Q24H and monitor CBC, C/S.  Vernard GamblesVeronda Tranell Wojtkiewicz, PharmD, BCPS 04/14/2014 11:03 PM

## 2014-04-14 NOTE — Progress Notes (Addendum)
[facility] PROGRESS NOTE  Filed Weights   04/13/14 2021 04/14/14 0547  Weight: 89.1 kg (196 lb 6.9 oz) 88.7 kg (195 lb 8.8 oz)        Intake/Output Summary (Last 24 hours) at 04/14/14 0820 Last data filed at 04/14/14 0093  Gross per 24 hour  Intake    240 ml  Output    550 ml  Net   -310 ml     Assessment/Plan: Acute on Chronic diastolic CHF (congestive heart failure), NYHA class 4: - On Lasix IV and metolazone, has significant JVD, crackles on her right lung and lower extremity edema. - Cont daily weight strict I and O's. - b-met in am. Estimated dry weight 91.8 kg.  PVD (peripheral vascular disease): - s/p recent left fem-tib bypass 3.3.2015. - VVS input regarding wound management. - WOC.  Non-ST elevation myocardial infarction (NSTEMI), initial episode of care - Cardiac markers mildly positive, with renal failure and HF. - She has known significant coronary artery disease, treated medically. She has not had chest pain and her ECG does not have acute ischemic changes. - most likely due to renal and heart failure.  CKD (chronic kidney disease) stage 4, GFR 15-29 ml/min/Diabetic nephropathy: - At baseline. - She has indicated not wanting dialysis in the past.  HYPERTENSION - mildly high. - As she diurese BP might improve.   DIABETES, TYPE 2: - poor ly controlled Hbg A1c 7.6     Code Status: Full  Condition: Guarded  Family Communication: Son at bedside. Admission, patients condition and plan of care including tests being ordered have been discussed with the patient and son who indicate understanding and agree with the plan and Code Status.  Disposition Plan: Admitted    Consultants:  Cardiology  VVS  Procedures: ECHO: none  Antibiotics:  none  HPI/Subjective: Still mildly SOB.  Objective: Filed Vitals:   04/13/14 1845 04/13/14 2021 04/14/14 0141 04/14/14 0547  BP:  133/50 161/72 146/63  Pulse: 57 62 62 51  Temp:  98.2 F (36.8 C)  97.3 F (36.3 C) 97.3 F (36.3 C)  TempSrc:  Oral Oral Oral  Resp: $Remo'20 22 22 20  'qHxBV$ Height:  $Remove'4\' 11"'ljLPzqP$  (1.499 m)    Weight:  89.1 kg (196 lb 6.9 oz)  88.7 kg (195 lb 8.8 oz)  SpO2: 100% 100% 100% 100%     Exam:  General: Alert, awake, oriented x3, in no acute distress.  HEENT: No bruits, no goiter. +JVD Heart: Regular rate and rhythm, without murmurs, rubs, gallops.  Lungs: Good air movement, crackles on the right lung  Abdomen: Soft, nontender, nondistended, positive bowel sounds.   Data Reviewed: Basic Metabolic Panel:  Recent Labs Lab 04/13/14 1444 04/14/14 0340  NA 140 143  K 4.5 4.1  CL 98 101  CO2 24 27  GLUCOSE 229* 162*  BUN 56* 59*  CREATININE 2.78* 3.05*  CALCIUM 9.9 9.6   Liver Function Tests: No results found for this basename: AST, ALT, ALKPHOS, BILITOT, PROT, ALBUMIN,  in the last 168 hours No results found for this basename: LIPASE, AMYLASE,  in the last 168 hours No results found for this basename: AMMONIA,  in the last 168 hours CBC:  Recent Labs Lab 04/13/14 1444  WBC 11.0*  HGB 10.1*  HCT 32.3*  MCV 92.0  PLT 218   Cardiac Enzymes:  Recent Labs Lab 04/13/14 1711 04/13/14 2229 04/14/14 0310  TROPONINI 0.81* 0.69* 0.73*   BNP (last 3 results)  Recent Labs  02/26/14 1230 04/13/14 1444  PROBNP 4820.0* 24938.0*   CBG:  Recent Labs Lab 04/13/14 2138 04/14/14 0544  GLUCAP 213* 152*    No results found for this or any previous visit (from the past 240 hour(s)).   Studies: Dg Chest 2 View  04/13/2014   CLINICAL DATA:  SHORTNESS OF BREATH  EXAM: CHEST  2 VIEW  COMPARISON:  DG CHEST 2 VIEW dated 01/02/2008  FINDINGS: Low lung volumes. Cardiac silhouette is enlarged. There is prominence of the interstitial markings and peribronchial cuffing. Small right pleural effusion is appreciated. Blunting of left costophrenic angle. Mild increased density within the lung bases. Osseous structures unremarkable.  IMPRESSION: Pulmonary edema  Small  right effusion, trace left effusion  Bibasilar atelectasis versus infiltrates right greater than left.   Electronically Signed   By: Margaree Mackintosh M.D.   On: 04/13/2014 15:16    Scheduled Meds: . aspirin EC  81 mg Oral Daily  . atorvastatin  40 mg Oral q morning - 10a  . carvedilol  12.5 mg Oral BID  . docusate sodium  100 mg Oral BID  . enoxaparin (LOVENOX) injection  30 mg Subcutaneous Q24H  . furosemide  80 mg Intravenous BID  . hydrALAZINE  10 mg Oral TID  . insulin aspart  0-15 Units Subcutaneous TID WC  . linagliptin  5 mg Oral Daily  . multivitamin  1 tablet Oral Daily  . sodium chloride  3 mL Intravenous Q12H   Continuous Infusions:    Charlynne Cousins  [facility] Pager [phone] If 8PM-8AM, please contact night-coverage at [email or web address], password [facility] 04/14/2014, 8:20 AM  LOS: 1 day

## 2014-04-14 NOTE — Progress Notes (Signed)
UR completed Marquise Wicke K. Shailah Gibbins, RN, BSN, MSHL, CCM  04/14/2014 12:30 PM

## 2014-04-14 NOTE — Progress Notes (Signed)
Patient: Dorothy FraiseCorine H Bloodworth / Admit Date: 04/13/2014 / Date of Encounter: 04/14/2014, 9:35 AM  Subjective  Denies any acute complaints including CP or SOB.  Objective   Telemetry: NSR  Physical Exam: Blood pressure 146/63, pulse 51, temperature 97.3 F (36.3 C), temperature source Oral, resp. rate 20, height 4\' 11"  (1.499 m), weight 195 lb 8.8 oz (88.7 kg), SpO2 100.00%. General: Well developed, chronically ill appearing AAF in no acute distress. Head: Normocephalic, atraumatic, sclera non-icteric, no xanthomas, nares are without discharge. Neck: Supple. Lungs: Diminished BS throughout without wheezes, rales, or rhonchi. Breathing is unlabored. Heart: RRR S1 S2 without murmurs, rubs, or gallops.  Abdomen: Soft, non-tender, non-distended with normoactive bowel sounds. No rebound/guarding. Extremities: No clubbing or cyanosis. 1+ soft bilat LE edema. Distal pedal pulses are 2+ and equal bilaterally. Neuro: Alert and oriented X 3. Moves all extremities spontaneously. Psych:  Responds to questions appropriately with a normal affect.  Intake/Output Summary (Last 24 hours) at 04/14/14 0935 Last data filed at 04/14/14 [id number]  Gross per 24 hour  Intake    240 ml  Output    550 ml  Net   -310 ml    Inpatient Medications:  . aspirin EC  81 mg Oral Daily  . atorvastatin  40 mg Oral q morning - 10a  . carvedilol  12.5 mg Oral BID  . docusate sodium  100 mg Oral BID  . enoxaparin (LOVENOX) injection  30 mg Subcutaneous Q24H  . furosemide  80 mg Intravenous 3 times per day  . hydrALAZINE  10 mg Oral TID  . insulin aspart  0-5 Units Subcutaneous QHS  . insulin aspart  0-9 Units Subcutaneous TID WC  . insulin aspart  3 Units Subcutaneous TID WC  . linagliptin  5 mg Oral Daily  . metolazone  2.5 mg Oral BID  . multivitamin  1 tablet Oral Daily  . sodium chloride  3 mL Intravenous Q12H   Infusions:    Labs:  Recent Labs  04/13/14 1444 04/14/14 0340  NA 140 143  K 4.5 4.1  CL 98 101   CO2 24 27  GLUCOSE 229* 162*  BUN 56* 59*  CREATININE 2.78* 3.05*  CALCIUM 9.9 9.6    Recent Labs  04/13/14 1444  WBC 11.0*  HGB 10.1*  HCT 32.3*  MCV 92.0  PLT 218    Recent Labs  04/13/14 1711 04/13/14 2229 04/14/14 0310  TROPONINI 0.81* 0.69* 0.73*   Recent Labs  04/13/14 2133  HGBA1C 7.6*    Radiology/Studies:  Dg Chest 2 View 04/13/2014   CLINICAL DATA:  SHORTNESS OF BREATH  EXAM: CHEST  2 VIEW  COMPARISON:  DG CHEST 2 VIEW dated 01/02/2008  FINDINGS: Low lung volumes. Cardiac silhouette is enlarged. There is prominence of the interstitial markings and peribronchial cuffing. Small right pleural effusion is appreciated. Blunting of left costophrenic angle. Mild increased density within the lung bases. Osseous structures unremarkable.  IMPRESSION: Pulmonary edema  Small right effusion, trace left effusion  Bibasilar atelectasis versus infiltrates right greater than left.   Electronically Signed   By: Salome HolmesHector  Cooper M.D.   On: 04/13/2014 15:16     Assessment and Plan   1. Acute on chronic combined systolic and diastolic heart failure (EF 45-50% by echo 01/2014) 2. CAD with NSTEMI this admission, possibly due to CHF/renal disease, recent high risk nuclear study 12/2013, felt c/w known previous anatomy and prior studies, not currently felt to be a candidate for invasive eval as she  does not want dialysis risk 3. PVD s/p recent left fem-tib bypass 02/2014, being seen by vascular due to poor healing 4. Stage 4-5 CKD 5. Hypertension  6. Anemia, per primary team, pt denies bleeding  7. Morbid obesity Body mass index is 39.47 kg/(m^2). 8. Diabetes mellitus, uncontrolled - A1c 7.6  Would recommend to continue treating CAD medically with aspirin, BB, statin. Would not add Plavix at this time due to anemia. Per notes her dry weight is 91.8kg, is 88.7 today but still with evidence of LEE. Not sure how compensated her baseline is. She appears quite deconditioned. May be helpful to have  input from nephrology.  Signed, Ronie Spiesayna Dunn PA-C   The patient was seen, examined and discussed with Ronie Spiesayna Dunn, PA-C and I agree with the above.   80 year old female admitted with NSTEMI in the settings of acute on chronic combined systolic and diastolic heart failure (EF 45-50%), BNP 24.000, she is severely fluid overloaded on exam, we will continue to diurese and monitor Crea. No invasive procedure as she wants to avoid dialysis.   Lars MassonKatarina H Rahiem Schellinger 04/14/2014

## 2014-04-14 NOTE — Progress Notes (Signed)
Patient has positive troponin trending upwards.  Patient asymptomatic.  NP Craige CottaKirby made aware.  RN will continue to monitor. Louretta ParmaAshley Quenisha Lovins, RN

## 2014-04-14 NOTE — Progress Notes (Signed)
Pt daughter at the bedside, pt resting comfortably. No signs of distress. Requested IV team to assess IV access, site currently working but is positional and unstable. Daughters questions answered, paged MD to speak with daughter.

## 2014-04-14 NOTE — Progress Notes (Signed)
Patient with increased SOB.  Respiratory notified and treatment given x1 per MAR. RN will continue to monitor. Louretta ParmaAshley Carole Deere, RN

## 2014-04-15 LAB — GLUCOSE, CAPILLARY
GLUCOSE-CAPILLARY: 93 mg/dL (ref 70–99)
GLUCOSE-CAPILLARY: 143 mg/dL — AB (ref 70–99)
GLUCOSE-CAPILLARY: 195 mg/dL — AB (ref 70–99)
Glucose-Capillary: 161 mg/dL — ABNORMAL HIGH (ref 70–99)

## 2014-04-15 LAB — BASIC METABOLIC PANEL
BUN: 57 mg/dL — AB (ref 6–23)
CO2: 28 meq/L (ref 19–32)
CREATININE: 3.07 mg/dL — AB (ref 0.50–1.10)
Calcium: 9.3 mg/dL (ref 8.4–10.5)
Chloride: 101 mEq/L (ref 96–112)
GFR calc Af Amer: 15 mL/min — ABNORMAL LOW (ref >90)
GFR calc non Af Amer: 13 mL/min — ABNORMAL LOW (ref >90)
Glucose, Bld: 98 mg/dL (ref 70–99)
Potassium: 3.7 mEq/L (ref 3.7–5.3)
Sodium: 144 mEq/L (ref 137–147)

## 2014-04-15 MED ORDER — METOLAZONE 5 MG PO TABS
5.0000 mg | ORAL_TABLET | Freq: Two times a day (BID) | ORAL | Status: AC
  Administered 2014-04-15 (×2): 5 mg via ORAL
  Filled 2014-04-15 (×3): qty 1

## 2014-04-15 MED ORDER — POLYETHYLENE GLYCOL 3350 17 G PO PACK
17.0000 g | PACK | Freq: Every day | ORAL | Status: DC
  Administered 2014-04-15: 17 g via ORAL
  Filled 2014-04-15 (×3): qty 1

## 2014-04-15 MED ORDER — FUROSEMIDE 10 MG/ML IJ SOLN
15.0000 mg/h | INTRAVENOUS | Status: DC
  Filled 2014-04-15: qty 25

## 2014-04-15 MED ORDER — FUROSEMIDE 80 MG PO TABS
80.0000 mg | ORAL_TABLET | Freq: Two times a day (BID) | ORAL | Status: DC
  Administered 2014-04-15 (×2): 80 mg via ORAL
  Filled 2014-04-15 (×5): qty 1

## 2014-04-15 NOTE — Progress Notes (Signed)
[facility] PROGRESS NOTE  Filed Weights   04/13/14 2021 04/14/14 0547 04/15/14 0535  Weight: 89.1 kg (196 lb 6.9 oz) 88.7 kg (195 lb 8.8 oz) 86.5 kg (190 lb 11.2 oz)        Intake/Output Summary (Last 24 hours) at 04/15/14 0813 Last data filed at 04/15/14 0601  Gross per 24 hour  Intake    600 ml  Output   2475 ml  Net  -1875 ml     Assessment/Plan: Acute on Chronic diastolic CHF (congestive heart failure), NYHA class 4: - On Lasix IV and metolazone, has significant JVD, crackles on her right lung and lower extremity edema. - keep leg elevated above heart. - weights 89.1 kb->88.7->86.5kg, strict I and O's, moderate UOP. - b-met in am. Replete electrolytes as needed. - cont coreg, hydralazine, imdur and diuretics.  PVD (peripheral vascular disease): - s/p recent left fem-tib bypass 3.3.2015. - VVS input pending - WOC.  Non-ST elevation myocardial infarction (NSTEMI), initial episode of care - Cardiac markers mildly positive, with renal failure and HF. - She has known significant coronary artery disease, treated medically. She has not had chest pain and her ECG does not have acute ischemic changes. - most likely due to renal and heart failure.  CKD (chronic kidney disease) stage 4, GFR 15-29 ml/min/Diabetic nephropathy: - At baseline. - She has indicated not wanting dialysis in the past.  HYPERTENSION - improved. - As she diurese BP might improve.   DIABETES, TYPE 2: - now controlled - Hbg A1c 7.6.     Code Status: Full  Condition: Guarded  Family Communication: Son at bedside. Admission, patients condition and plan of care including tests being ordered have been discussed with the patient and son who indicate understanding and agree with the plan and Code Status.  Disposition Plan: Admitted    Consultants:  Cardiology  VVS  Procedures: ECHO: none  Antibiotics:  none  HPI/Subjective: She relates she is ready to go home as she is feeling  a lot better.  Objective: Filed Vitals:   04/14/14 1300 04/14/14 2050 04/15/14 0116 04/15/14 0535  BP: 138/55 153/61 149/42 128/61  Pulse: 60 64 66 59  Temp: 97.1 F (36.2 C) 99.2 F (37.3 C) 99.3 F (37.4 C) 98.9 F (37.2 C)  TempSrc: Oral Oral Oral Oral  Resp: $Remo'22 20 20 18  'grqfx$ Height:      Weight:    86.5 kg (190 lb 11.2 oz)  SpO2: 100% 100% 100% 100%     Exam:  General: Alert, awake, oriented x3, in no acute distress.  HEENT: No bruits, no goiter. +JVD Heart: Regular rate and rhythm, 3+ lower extremity edema.  Lungs: Good air movement, crackles on the right lung  Abdomen: Soft, nontender, nondistended, positive bowel sounds.   Data Reviewed: Basic Metabolic Panel:  Recent Labs Lab 04/13/14 1444 04/14/14 0340 04/15/14 0325  NA 140 143 144  K 4.5 4.1 3.7  CL 98 101 101  CO2 $Re'24 27 28  'Wsw$ GLUCOSE 229* 162* 98  BUN 56* 59* 57*  CREATININE 2.78* 3.05* 3.07*  CALCIUM 9.9 9.6 9.3   Liver Function Tests: No results found for this basename: AST, ALT, ALKPHOS, BILITOT, PROT, ALBUMIN,  in the last 168 hours No results found for this basename: LIPASE, AMYLASE,  in the last 168 hours No results found for this basename: AMMONIA,  in the last 168 hours CBC:  Recent Labs Lab 04/13/14 1444  WBC 11.0*  HGB 10.1*  HCT 32.3*  MCV 92.0  PLT 218   Cardiac Enzymes:  Recent Labs Lab 04/13/14 1711 04/13/14 2229 04/14/14 0310 04/14/14 0830  TROPONINI 0.81* 0.69* 0.73* 0.69*   BNP (last 3 results)  Recent Labs  02/26/14 1230 04/13/14 1444  PROBNP 4820.0* 24938.0*   CBG:  Recent Labs Lab 04/14/14 0544 04/14/14 1116 04/14/14 1628 04/14/14 2207 04/15/14 0546  GLUCAP 152* 169* 157* 147* 93    No results found for this or any previous visit (from the past 240 hour(s)).   Studies: Dg Chest 2 View  04/13/2014   CLINICAL DATA:  SHORTNESS OF BREATH  EXAM: CHEST  2 VIEW  COMPARISON:  DG CHEST 2 VIEW dated 01/02/2008  FINDINGS: Low lung volumes. Cardiac silhouette is  enlarged. There is prominence of the interstitial markings and peribronchial cuffing. Small right pleural effusion is appreciated. Blunting of left costophrenic angle. Mild increased density within the lung bases. Osseous structures unremarkable.  IMPRESSION: Pulmonary edema  Small right effusion, trace left effusion  Bibasilar atelectasis versus infiltrates right greater than left.   Electronically Signed   By: Margaree Mackintosh M.D.   On: 04/13/2014 15:16    Scheduled Meds: . aspirin EC  81 mg Oral Daily  . atorvastatin  40 mg Oral q morning - 10a  . carvedilol  12.5 mg Oral BID  . cefTRIAXone (ROCEPHIN)  IV  1 g Intravenous Q24H  . docusate sodium  100 mg Oral BID  . enoxaparin (LOVENOX) injection  30 mg Subcutaneous Q24H  . furosemide  80 mg Intravenous 3 times per day  . hydrALAZINE  10 mg Oral TID  . insulin aspart  0-5 Units Subcutaneous QHS  . insulin aspart  0-9 Units Subcutaneous TID WC  . insulin aspart  3 Units Subcutaneous TID WC  . linagliptin  5 mg Oral Daily  . multivitamin  1 tablet Oral Daily  . sodium chloride  3 mL Intravenous Q12H   Continuous Infusions:    Charlynne Cousins  [facility] Pager [phone] If 8PM-8AM, please contact night-coverage at [email or web address], password [facility] 04/15/2014, 8:13 AM  LOS: 2 days

## 2014-04-15 NOTE — Progress Notes (Addendum)
Patient: Dorothy FraiseCorine H Gatchell / Admit Date: 04/13/2014 / Date of Encounter: 04/15/2014, 9:38 AM  Subjective   Denies any acute complaints including CP or SOB. She started to walk and feels good.  Objective   Telemetry: NSR  Physical Exam: Blood pressure 128/61, pulse 59, temperature 98.9 F (37.2 C), temperature source Oral, resp. rate 18, height 4\' 11"  (1.499 m), weight 190 lb 11.2 oz (86.5 kg), SpO2 100.00%.  General: Well developed, chronically ill appearing AAF in no acute distress. Head: Normocephalic, atraumatic, sclera non-icteric, no xanthomas, nares are without discharge. Neck: Supple. Lungs: Diminished BS throughout without wheezes, rales, or rhonchi. Breathing is unlabored. Heart: RRR S1 S2 without murmurs, rubs, or gallops.  Abdomen: Soft, non-tender, non-distended with normoactive bowel sounds. No rebound/guarding. Extremities: No clubbing or cyanosis. minimal LE edema. Distal pedal pulses are 2+ and equal bilaterally. Neuro: Alert and oriented X 3. Moves all extremities spontaneously. Psych:  Responds to questions appropriately with a normal affect.  Intake/Output Summary (Last 24 hours) at 04/15/14 0938 Last data filed at 04/15/14 0601  Gross per 24 hour  Intake    600 ml  Output   2475 ml  Net  -1875 ml    Inpatient Medications:  . aspirin EC  81 mg Oral Daily  . atorvastatin  40 mg Oral q morning - 10a  . carvedilol  12.5 mg Oral BID  . cefTRIAXone (ROCEPHIN)  IV  1 g Intravenous Q24H  . docusate sodium  100 mg Oral BID  . enoxaparin (LOVENOX) injection  30 mg Subcutaneous Q24H  . furosemide  80 mg Intravenous 3 times per day  . hydrALAZINE  10 mg Oral TID  . insulin aspart  0-5 Units Subcutaneous QHS  . insulin aspart  0-9 Units Subcutaneous TID WC  . insulin aspart  3 Units Subcutaneous TID WC  . linagliptin  5 mg Oral Daily  . metolazone  5 mg Oral BID  . multivitamin  1 tablet Oral Daily  . polyethylene glycol  17 g Oral Daily  . sodium chloride  3 mL  Intravenous Q12H   Infusions:  . furosemide (LASIX) infusion      Labs:  Recent Labs  04/14/14 0340 04/15/14 0325  NA 143 144  K 4.1 3.7  CL 101 101  CO2 27 28  GLUCOSE 162* 98  BUN 59* 57*  CREATININE 3.05* 3.07*  CALCIUM 9.6 9.3    Recent Labs  04/13/14 1444  WBC 11.0*  HGB 10.1*  HCT 32.3*  MCV 92.0  PLT 218    Recent Labs  04/13/14 1711 04/13/14 2229 04/14/14 0310 04/14/14 0830  TROPONINI 0.81* 0.69* 0.73* 0.69*    Recent Labs  04/13/14 2133  HGBA1C 7.6*    Radiology/Studies:  Dg Chest 2 View 04/13/2014   CLINICAL DATA:  SHORTNESS OF BREATH  EXAM: CHEST  2 VIEW  COMPARISON:  DG CHEST 2 VIEW dated 01/02/2008  FINDINGS: Low lung volumes. Cardiac silhouette is enlarged. There is prominence of the interstitial markings and peribronchial cuffing. Small right pleural effusion is appreciated. Blunting of left costophrenic angle. Mild increased density within the lung bases. Osseous structures unremarkable.  IMPRESSION: Pulmonary edema  Small right effusion, trace left effusion  Bibasilar atelectasis versus infiltrates right greater than left.   Electronically Signed   By: Salome HolmesHector  Cooper M.D.   On: 04/13/2014 15:16     Assessment and Plan   1. Acute on chronic combined systolic and diastolic heart failure (EF 45-50% by echo 01/2014)  2. CAD with NSTEMI this admission, possibly due to CHF/renal disease, recent high risk nuclear study 12/2013, felt c/w known previous anatomy and prior studies, not currently felt to be a candidate for invasive eval as she does not want dialysis risk 3. PVD s/p recent left fem-tib bypass 02/2014, being seen by vascular due to poor healing 4. Stage 4-5 CKD 5. Hypertension  6. Anemia, per primary team, pt denies bleeding  7. Morbid obesity Body mass index is 39.47 kg/(m^2). 8. Diabetes mellitus, uncontrolled - A1c 7.6  Would recommend to continue treating CAD medically with aspirin, BB, statin. Would not add Plavix at this time due to  anemia. Per notes her dry weight is 91.8kg, is 86.5 today and significantly improved on physical exam.  We will switch to lasix 80 mg po BID and if she tolerates it, discharge tomorrow. Physical therapy.    Lars MassonKatarina H Trinten Boudoin 04/15/2014

## 2014-04-15 NOTE — Evaluation (Addendum)
Physical Therapy Evaluation and Discharge Patient Details Name: Dorothy Bartlett MRN: [id number] DOB: 04-15-1926 Today's Date: 04/15/2014   History of Present Illness  Pt admitted with CHF exacerbation who presented to the emergency department for shortness of breath. Her shortness of breath has been increasing over the past 2-3 weeks. She was in the nursing home for rehabilitation for approximately one month after her femoral tibial bypass. Patient was treated for CHF or in the nursing home.  Clinical Impression  Patient evaluated by Physical Therapy with no further acute PT needs identified. All education has been completed and the patient has no further questions. Pt states she is at her baseline of function, and demonstrated good mobility and safety awareness during session. See below for any follow-up Physial Therapy or equipment needs. PT is signing off. If needs change, please reconsult. Thank you for this referral.     Follow Up Recommendations No PT follow up    Equipment Recommendations  None recommended by PT    Recommendations for Other Services       Precautions / Restrictions Precautions Precautions: Fall Restrictions Weight Bearing Restrictions: No      Mobility  Bed Mobility               General bed mobility comments: Pt received sitting in the chair.   Transfers Overall transfer level: Modified independent Equipment used: Rolling walker (2 wheeled)             General transfer comment: Pt demonstrated proper hand placement and safety awareness.   Ambulation/Gait Ambulation/Gait assistance: Supervision Ambulation Distance (Feet): 75 Feet Assistive device: Rolling walker (2 wheeled) Gait Pattern/deviations: Step-through pattern;Decreased stride length Gait velocity: Decreased Gait velocity interpretation: Below normal speed for age/gender General Gait Details: Supervision to ensure pt did not trip over catheter line. Overall safe with ambulation.    Stairs            Wheelchair Mobility    Modified Rankin (Stroke Patients Only)       Balance Overall balance assessment: No apparent balance deficits (not formally assessed)                                           Pertinent Vitals/Pain Pt reports no pain throughout session.     Home Living Family/patient expects to be discharged to:: Private residence Living Arrangements: Alone;Other (Comment) (Daughter to stay with her) Available Help at Discharge: Family;Available 24 hours/day Type of Home: House Home Access: Stairs to enter Entrance Stairs-Rails: Left Entrance Stairs-Number of Steps: 2 Home Layout: One level Home Equipment: Walker - 2 wheels;Bedside commode Additional Comments: pt has tub shower, using walker all the time    Prior Function Level of Independence: Needs assistance   Gait / Transfers Assistance Needed: Daughter assist if needed for transfers  ADL's / Homemaking Assistance Needed: Daughter cooks and does housework        Hand Dominance   Dominant Hand: Right    Extremity/Trunk Assessment   Upper Extremity Assessment: Overall WFL for tasks assessed           Lower Extremity Assessment: Overall WFL for tasks assessed      Cervical / Trunk Assessment: Kyphotic  Communication   Communication: HOH  Cognition Arousal/Alertness: Awake/alert Behavior During Therapy: WFL for tasks assessed/performed Overall Cognitive Status: Within Functional Limits for tasks assessed  General Comments      Exercises        Assessment/Plan    PT Assessment Patent does not need any further PT services  PT Diagnosis     PT Problem List    PT Treatment Interventions     PT Goals (Current goals can be found in the Care Plan section) Acute Rehab PT Goals PT Goal Formulation: No goals set, d/c therapy    Frequency     Barriers to discharge        Co-evaluation               End of  Session Equipment Utilized During Treatment: Gait belt;Oxygen Activity Tolerance: Patient tolerated treatment well Patient left: in chair;with call bell/phone within reach Nurse Communication: Mobility status         Time: 0927-0956 PT Time Calculation (min): 29 min   Charges:   PT Evaluation $Initial PT Evaluation Tier I: 1 Procedure PT Treatments $Therapeutic Activity: 8-22 mins   PT G Codes:          Ruthann CancerLaura Hamilton 04/15/2014, 12:11 PM  Ruthann CancerLaura Hamilton, PT, DPT Acute Rehabilitation Services Pager: [phone]

## 2014-04-16 ENCOUNTER — Inpatient Hospital Stay (HOSPITAL_COMMUNITY): Payer: Medicare Other

## 2014-04-16 DIAGNOSIS — M79609 Pain in unspecified limb: Secondary | ICD-10-CM

## 2014-04-16 LAB — BASIC METABOLIC PANEL
BUN: 54 mg/dL — ABNORMAL HIGH (ref 6–23)
CHLORIDE: 93 meq/L — AB (ref 96–112)
CO2: 30 mEq/L (ref 19–32)
Calcium: 9.4 mg/dL (ref 8.4–10.5)
Creatinine, Ser: 3.02 mg/dL — ABNORMAL HIGH (ref 0.50–1.10)
GFR calc Af Amer: 15 mL/min — ABNORMAL LOW (ref >90)
GFR calc non Af Amer: 13 mL/min — ABNORMAL LOW (ref >90)
Glucose, Bld: 135 mg/dL — ABNORMAL HIGH (ref 70–99)
Potassium: 3.4 mEq/L — ABNORMAL LOW (ref 3.7–5.3)
Sodium: 140 mEq/L (ref 137–147)

## 2014-04-16 LAB — GLUCOSE, CAPILLARY
GLUCOSE-CAPILLARY: 276 mg/dL — AB (ref 70–99)
GLUCOSE-CAPILLARY: 161 mg/dL — AB (ref 70–99)
Glucose-Capillary: 141 mg/dL — ABNORMAL HIGH (ref 70–99)
Glucose-Capillary: 245 mg/dL — ABNORMAL HIGH (ref 70–99)

## 2014-04-16 MED ORDER — FUROSEMIDE 10 MG/ML IJ SOLN
80.0000 mg | Freq: Three times a day (TID) | INTRAMUSCULAR | Status: DC
  Administered 2014-04-16: 80 mg via INTRAVENOUS

## 2014-04-16 MED ORDER — ISOSORBIDE MONONITRATE ER 30 MG PO TB24
30.0000 mg | ORAL_TABLET | Freq: Every day | ORAL | Status: DC
  Administered 2014-04-16 – 2014-04-18 (×3): 30 mg via ORAL
  Filled 2014-04-16 (×3): qty 1

## 2014-04-16 MED ORDER — FUROSEMIDE 10 MG/ML IJ SOLN
80.0000 mg | Freq: Two times a day (BID) | INTRAMUSCULAR | Status: AC
  Administered 2014-04-16: 80 mg via INTRAVENOUS
  Filled 2014-04-16: qty 8

## 2014-04-16 MED ORDER — FUROSEMIDE 10 MG/ML IJ SOLN: 80.0000 mg | Freq: Two times a day (BID) | INTRAMUSCULAR | Status: DC

## 2014-04-16 MED ORDER — POTASSIUM CHLORIDE CRYS ER 20 MEQ PO TBCR
40.0000 meq | EXTENDED_RELEASE_TABLET | Freq: Two times a day (BID) | ORAL | Status: AC
  Administered 2014-04-16 (×2): 40 meq via ORAL
  Filled 2014-04-16 (×2): qty 2

## 2014-04-16 MED ORDER — FUROSEMIDE 80 MG PO TABS
80.0000 mg | ORAL_TABLET | Freq: Two times a day (BID) | ORAL | Status: DC
  Administered 2014-04-16: 80 mg via ORAL
  Filled 2014-04-16 (×4): qty 1

## 2014-04-16 MED ORDER — METOLAZONE 10 MG PO TABS
10.0000 mg | ORAL_TABLET | Freq: Two times a day (BID) | ORAL | Status: DC
  Filled 2014-04-16 (×2): qty 1

## 2014-04-16 MED ORDER — FUROSEMIDE 10 MG/ML IJ SOLN
60.0000 mg | Freq: Two times a day (BID) | INTRAMUSCULAR | Status: DC
  Administered 2014-04-16: 60 mg via INTRAVENOUS
  Filled 2014-04-16: qty 6

## 2014-04-16 MED ORDER — METOLAZONE 5 MG PO TABS
5.0000 mg | ORAL_TABLET | Freq: Two times a day (BID) | ORAL | Status: AC
  Administered 2014-04-16 (×2): 5 mg via ORAL
  Filled 2014-04-16 (×2): qty 1

## 2014-04-16 NOTE — Progress Notes (Addendum)
[facility] PROGRESS NOTE  Filed Weights   04/14/14 0547 04/15/14 0535 04/16/14 0407  Weight: 88.7 kg (195 lb 8.8 oz) 86.5 kg (190 lb 11.2 oz) 86.8 kg (191 lb 5.8 oz)        Intake/Output Summary (Last 24 hours) at 04/16/14 6226 Last data filed at 04/16/14 0300  Gross per 24 hour  Intake    483 ml  Output   1651 ml  Net  -1168 ml     Assessment/Plan: Acute on Chronic diastolic CHF (congestive heart failure), NYHA class 4: - Change to Lasix IV and metolazone, has significant JVD and lower extremity edema. Will d/w cards. - keep leg elevated above heart. - weights 89.1 kb->88.7->86.5kg, strict I and O's, moderate UOP. - b-met in am. Replete electrolytes as needed. - cont coreg, hydralazine, imdur and diuretics.  PVD (peripheral vascular disease): - s/p recent left fem-tib bypass 3.3.2015.  Possible UTI: - Rocephin on Rocephin. - UC Gram negative.  Non-ST elevation myocardial infarction (NSTEMI), initial episode of care - Cardiac markers mildly positive, with renal failure and HF. - She has known significant coronary artery disease, treated medically. She has not had chest pain and her ECG does not have acute ischemic changes. - most likely due to renal and heart failure.  CKD (chronic kidney disease) stage 4, GFR 15-29 ml/min/Diabetic nephropathy: - At baseline. - She has indicated not wanting dialysis in the past.  HYPERTENSION - improved. - As she diurese BP might improve.   DIABETES, TYPE 2: - now controlled - Hbg A1c 7.6.     Code Status: Full  Condition: Guarded  Family Communication: Son at bedside. Admission, patients condition and plan of care including tests being ordered have been discussed with the patient and son who indicate understanding and agree with the plan and Code Status.  Disposition Plan: Admitted    Consultants:  Cardiology  VVS  Procedures: ECHO: none  Antibiotics:  none  HPI/Subjective: She relates she is ready  to go home as she is feeling a lot better.  Objective: Filed Vitals:   04/15/14 1532 04/15/14 1728 04/15/14 2046 04/16/14 0407  BP:  132/64 144/55 154/62  Pulse:   78 88  Temp:   98 F (36.7 C) 98.5 F (36.9 C)  TempSrc:   Axillary Axillary  Resp:   18 18  Height:      Weight:    86.8 kg (191 lb 5.8 oz)  SpO2: 93%  100% 100%     Exam:  General: Alert, awake, oriented x3, in no acute distress.  HEENT: No bruits, no goiter. +JVD Heart: Regular rate and rhythm, 3+ lower extremity edema.  Lungs: Good air movement, clear Abdomen: Soft, nontender, nondistended, positive bowel sounds.   Data Reviewed: Basic Metabolic Panel:  Recent Labs Lab 04/13/14 1444 04/14/14 0340 04/15/14 0325 04/16/14 0537  NA 140 143 144 140  K 4.5 4.1 3.7 3.4*  CL 98 101 101 93*  CO2 $Re'24 27 28 30  'TfA$ GLUCOSE 229* 162* 98 135*  BUN 56* 59* 57* 54*  CREATININE 2.78* 3.05* 3.07* 3.02*  CALCIUM 9.9 9.6 9.3 9.4   Liver Function Tests: No results found for this basename: AST, ALT, ALKPHOS, BILITOT, PROT, ALBUMIN,  in the last 168 hours No results found for this basename: LIPASE, AMYLASE,  in the last 168 hours No results found for this basename: AMMONIA,  in the last 168 hours CBC:  Recent Labs Lab 04/13/14 1444  WBC 11.0*  HGB 10.1*  HCT 32.3*  MCV 92.0  PLT 218   Cardiac Enzymes:  Recent Labs Lab 04/13/14 1711 04/13/14 2229 04/14/14 0310 04/14/14 0830  TROPONINI 0.81* 0.69* 0.73* 0.69*   BNP (last 3 results)  Recent Labs  02/26/14 1230 04/13/14 1444  PROBNP 4820.0* 24938.0*   CBG:  Recent Labs Lab 04/15/14 0546 04/15/14 1205 04/15/14 1633 04/15/14 2133 04/16/14 0544  GLUCAP 93 161* 143* 195* 141*    No results found for this or any previous visit (from the past 240 hour(s)).   Studies: No results found.  Scheduled Meds: . aspirin EC  81 mg Oral Daily  . atorvastatin  40 mg Oral q morning - 10a  . carvedilol  12.5 mg Oral BID  . cefTRIAXone (ROCEPHIN)  IV  1 g  Intravenous Q24H  . docusate sodium  100 mg Oral BID  . enoxaparin (LOVENOX) injection  30 mg Subcutaneous Q24H  . furosemide  80 mg Oral BID  . hydrALAZINE  10 mg Oral TID  . insulin aspart  0-5 Units Subcutaneous QHS  . insulin aspart  0-9 Units Subcutaneous TID WC  . insulin aspart  3 Units Subcutaneous TID WC  . linagliptin  5 mg Oral Daily  . metolazone  10 mg Oral BID  . multivitamin  1 tablet Oral Daily  . polyethylene glycol  17 g Oral Daily  . sodium chloride  3 mL Intravenous Q12H   Continuous Infusions:    Charlynne Cousins  [facility] Pager [phone] If 8PM-8AM, please contact night-coverage at [email or web address], password [facility] 04/16/2014, 7:38 AM  LOS: 3 days

## 2014-04-16 NOTE — Progress Notes (Signed)
Patient: Dorothy Bartlett / Admit Date: 04/13/2014 / Date of Encounter: 04/16/2014, 9:32 AM  Subjective  Feels good. No complaints. No CP or SOB. PT found she had good mobility yesterday.  Objective   Telemetry: NSR  Physical Exam: Blood pressure 154/62, pulse 88, temperature 98.5 F (36.9 C), temperature source Axillary, resp. rate 18, height 4\' 11"  (1.499 m), weight 191 lb 5.8 oz (86.8 kg), SpO2 100.00%. General: Well developed, overweight AAF in no acute distress.  Head: Normocephalic, atraumatic, sclera non-icteric, no xanthomas, nares are without discharge.  Neck: Supple. JVD difficult to assess due to neck habitus. Lungs: Clear to auscultation throughout without wheezes, rales, or rhonchi. Breathing is unlabored.  Heart: RRR S1 S2 without murmurs, rubs, or gallops.  Abdomen: Soft, non-tender, non-distended with normoactive bowel sounds. No rebound/guarding.  Extremities: No clubbing or cyanosis. minimal LE edema. Distal pedal pulses are 2+ and equal bilaterally.  Neuro: Alert and oriented X 3. Moves all extremities spontaneously.  Psych: Responds to questions appropriately with a normal affect.   Intake/Output Summary (Last 24 hours) at 04/16/14 0932 Last data filed at 04/16/14 0300  Gross per 24 hour  Intake    483 ml  Output   1651 ml  Net  -1168 ml    Inpatient Medications:  . aspirin EC  81 mg Oral Daily  . atorvastatin  40 mg Oral q morning - 10a  . carvedilol  12.5 mg Oral BID  . cefTRIAXone (ROCEPHIN)  IV  1 g Intravenous Q24H  . docusate sodium  100 mg Oral BID  . enoxaparin (LOVENOX) injection  30 mg Subcutaneous Q24H  . furosemide  80 mg Intravenous 3 times per day  . hydrALAZINE  10 mg Oral TID  . insulin aspart  0-5 Units Subcutaneous QHS  . insulin aspart  0-9 Units Subcutaneous TID WC  . insulin aspart  3 Units Subcutaneous TID WC  . linagliptin  5 mg Oral Daily  . metolazone  5 mg Oral BID  . multivitamin  1 tablet Oral Daily  . polyethylene glycol  17  g Oral Daily  . potassium chloride  40 mEq Oral BID  . sodium chloride  3 mL Intravenous Q12H   Infusions:    Labs:  Recent Labs  04/15/14 0325 04/16/14 0537  NA 144 140  K 3.7 3.4*  CL 101 93*  CO2 28 30  GLUCOSE 98 135*  BUN 57* 54*  CREATININE 3.07* 3.02*  CALCIUM 9.3 9.4    Recent Labs  04/13/14 1444  WBC 11.0*  HGB 10.1*  HCT 32.3*  MCV 92.0  PLT 218    Recent Labs  04/13/14 1711 04/13/14 2229 04/14/14 0310 04/14/14 0830  TROPONINI 0.81* 0.69* 0.73* 0.69*    Recent Labs  04/13/14 2133  HGBA1C 7.6*     Radiology/Studies:  Dg Chest 2 View 04/13/2014   CLINICAL DATA:  SHORTNESS OF BREATH  EXAM: CHEST  2 VIEW  COMPARISON:  DG CHEST 2 VIEW dated 01/02/2008  FINDINGS: Low lung volumes. Cardiac silhouette is enlarged. There is prominence of the interstitial markings and peribronchial cuffing. Small right pleural effusion is appreciated. Blunting of left costophrenic angle. Mild increased density within the lung bases. Osseous structures unremarkable.  IMPRESSION: Pulmonary edema  Small right effusion, trace left effusion  Bibasilar atelectasis versus infiltrates right greater than left.   Electronically Signed   By: Salome HolmesHector  Cooper M.D.   On: 04/13/2014 15:16     Assessment and Plan  1. Acute on  chronic combined systolic and diastolic heart failure (EF 45-50% by echo 01/2014)  2. CAD with NSTEMI this admission, possibly due to CHF/renal disease, recent high risk nuclear study 12/2013, felt c/w known previous anatomy and prior studies, not currently felt to be a candidate for invasive eval as she does not want dialysis risk  3. PVD s/p recent left fem-tib bypass 02/2014, being seen by vascular due to poor healing  4. Stage 4-5 CKD  5. Hypertension  6. Anemia, per primary team, pt denies bleeding  7. Morbid obesity Body mass index is 38.63 kg/(m^2). 8. Diabetes mellitus, uncontrolled - A1c 7.6  Would recommend to continue treating CAD medically with aspirin, BB,  statin. Would not add Plavix at this time due to anemia. Per notes her dry weight was 91.8kg. She is 86.8 today and volume status appears to be improving compared to Wednesday. Not sure how compensated her baseline is. She states LEE never fully resolves. Dr. David StallFeliz-Ortiz changed her back to IV lasix today due to physical exam findings - will d/w Dr. Delton SeeNelson. We need to be careful with diuresis as she affirms her decision that she is unwilling to do HD in the future which is reasonable given advanced age and comorbiditis.  Signed, Ronie Spiesayna Dunn PA-C    The patient was seen, examined and discussed with Ronie Spiesayna Dunn, PA-C and I agree with the above.   Would recommend to continue treating CAD medically with aspirin, BB, statin. Would not add Plavix at this time due to anemia. NO cath for NSTEMI as she doesn't want to end up on HD (Crea 3 and stable). Per notes her dry weight is 91.8kg, is 86.8 today and significantly improved on physical exam.  We will switch to lasix 80 mg po BID and if she tolerates it, discharge tomorrow. Physical therapy needed as she is severely deconditioned. We will add Imdur 30 mg PO daily for better BP control and in addition to hydralazine for CHF.   Lars MassonKatarina H Darrah Dredge 04/16/2014

## 2014-04-17 LAB — BASIC METABOLIC PANEL
BUN: 58 mg/dL — ABNORMAL HIGH (ref 6–23)
CALCIUM: 9.9 mg/dL (ref 8.4–10.5)
CO2: 31 mEq/L (ref 19–32)
CREATININE: 3.11 mg/dL — AB (ref 0.50–1.10)
Chloride: 96 mEq/L (ref 96–112)
GFR calc Af Amer: 15 mL/min — ABNORMAL LOW (ref >90)
GFR calc non Af Amer: 13 mL/min — ABNORMAL LOW (ref >90)
Glucose, Bld: 102 mg/dL — ABNORMAL HIGH (ref 70–99)
Potassium: 3.8 mEq/L (ref 3.7–5.3)
SODIUM: 143 meq/L (ref 137–147)

## 2014-04-17 LAB — GLUCOSE, CAPILLARY
GLUCOSE-CAPILLARY: 142 mg/dL — AB (ref 70–99)
GLUCOSE-CAPILLARY: 102 mg/dL — AB (ref 70–99)
Glucose-Capillary: 107 mg/dL — ABNORMAL HIGH (ref 70–99)
Glucose-Capillary: 162 mg/dL — ABNORMAL HIGH (ref 70–99)

## 2014-04-17 LAB — URINE CULTURE

## 2014-04-17 MED ORDER — FUROSEMIDE 80 MG PO TABS
80.0000 mg | ORAL_TABLET | Freq: Two times a day (BID) | ORAL | Status: DC
  Administered 2014-04-17: 80 mg via ORAL
  Filled 2014-04-17 (×4): qty 1

## 2014-04-17 MED ORDER — POLYETHYLENE GLYCOL 3350 17 G PO PACK
17.0000 g | PACK | Freq: Every day | ORAL | Status: DC
  Filled 2014-04-17 (×2): qty 1

## 2014-04-17 MED ORDER — CIPROFLOXACIN HCL 500 MG PO TABS
500.0000 mg | ORAL_TABLET | Freq: Every day | ORAL | Status: DC
  Administered 2014-04-17 – 2014-04-18 (×2): 500 mg via ORAL
  Filled 2014-04-17 (×3): qty 1

## 2014-04-17 MED ORDER — FUROSEMIDE 10 MG/ML IJ SOLN
80.0000 mg | Freq: Three times a day (TID) | INTRAMUSCULAR | Status: DC
  Administered 2014-04-17: 80 mg via INTRAVENOUS
  Filled 2014-04-17: qty 8

## 2014-04-17 NOTE — Progress Notes (Signed)
Primary cardiologist: Dr. Garnette ScheuermannHank Bartlett  Subjective:   Up in chair, states she feels better, no recent chest pain.   Objective:   Temp:  [98 F (36.7 C)-98.6 F (37 C)] 98.2 F (36.8 C) (04/18 0358) Pulse Rate:  [63-69] 69 (04/18 0358) Resp:  [18] 18 (04/18 0358) BP: (130-139)/(46-71) 139/71 mmHg (04/18 0358) SpO2:  [96 %-97 %] 96 % (04/18 0358) Weight:  [186 lb 4.6 oz (84.5 kg)] 186 lb 4.6 oz (84.5 kg) (04/18 0358) Last BM Date: 04/16/14  Filed Weights   04/15/14 0535 04/16/14 0407 04/17/14 0358  Weight: 190 lb 11.2 oz (86.5 kg) 191 lb 5.8 oz (86.8 kg) 186 lb 4.6 oz (84.5 kg)    Intake/Output Summary (Last 24 hours) at 04/17/14 1049 Last data filed at 04/17/14 0402  Gross per 24 hour  Intake    530 ml  Output   2201 ml  Net  -1671 ml    Exam:  General: Obese woman, no distress.  Lungs: Decreased breath sounds, nonlabored.  Cardiac: RRR, no gallop.  Extremities: Mild edema.   Lab Results:  Basic Metabolic Panel:  Recent Labs Lab 04/15/14 0325 04/16/14 0537 04/17/14 0434  NA 144 140 143  K 3.7 3.4* 3.8  CL 101 93* 96  CO2 28 30 31   GLUCOSE 98 135* 102*  BUN 57* 54* 58*  CREATININE 3.07* 3.02* 3.11*  CALCIUM 9.3 9.4 9.9    CBC:  Recent Labs Lab 04/13/14 1444  WBC 11.0*  HGB 10.1*  HCT 32.3*  MCV 92.0  PLT 218    Cardiac Enzymes:  Recent Labs Lab 04/13/14 2229 04/14/14 0310 04/14/14 0830  TROPONINI 0.69* 0.73* 0.69*    BNP:  Recent Labs  02/26/14 1230 04/13/14 1444  PROBNP 4820.0* 24938.0*     Medications:   Scheduled Medications: . aspirin EC  81 mg Oral Daily  . atorvastatin  40 mg Oral q morning - 10a  . carvedilol  12.5 mg Oral BID  . cefTRIAXone (ROCEPHIN)  IV  1 g Intravenous Q24H  . docusate sodium  100 mg Oral BID  . enoxaparin (LOVENOX) injection  30 mg Subcutaneous Q24H  . furosemide  80 mg Intravenous Q8H  . hydrALAZINE  10 mg Oral TID  . insulin aspart  0-5 Units Subcutaneous QHS  . insulin aspart  0-9  Units Subcutaneous TID WC  . insulin aspart  3 Units Subcutaneous TID WC  . isosorbide mononitrate  30 mg Oral Daily  . linagliptin  5 mg Oral Daily  . multivitamin  1 tablet Oral Daily  . polyethylene glycol  17 g Oral Daily  . sodium chloride  3 mL Intravenous Q12H      PRN Medications:  sodium chloride, albuterol, albuterol, sodium chloride   Assessment:   1. Acute on chronic combined heart failure, LVEF 45-50%.  2. NSTEMI with known history of CAD, being managed medically in light of high risk for contrast nephropathy.  3. 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  4. Hypertension.  5. Morbid obesity.  6. Type 2 diabetes mellitus, hemoglobin A1c 7.6.  7. CAD status post left femoral-tibial bypass in March of this year.   Plan/Discussion:    Discussed with Hospitalist team. Currently on aspirin, Coreg, Lipitor, IV Lasix at 80 mg twice daily, hydralazine, Imdur. Currently no plan for Plavix with anemia. Would suggest change Lasix to 80 mg oral twice daily to make sure that his urine output remains adequate. Followup BMET. in a.m. Likely discharge next  24 hours.   Jonelle SidleSamuel G. Leeum Sankey, M.D., F.A.C.C.

## 2014-04-17 NOTE — Progress Notes (Signed)
PT Cancellation Note  Patient Details Name: Dorothy Bartlett MRN: [id number] DOB: 05-19-1926   Cancelled Treatment:    Reason Eval/Treat Not Completed: PT screened, no needs identified, will sign off.  Please see PT evaluation of 04/15/14.  Patient ambulated 5775' with supervision and RW.  Patient reports nothing has changed since this evaluation.  Patient has assist at home from church members and her daughter.  PT will sign off.   Vena AustriaSusan H Stephene Alegria 04/17/2014, 12:24 PM Durenda HurtSusan H. Renaldo Fiddleravis, PT, [facility] CenterMBA Acute Rehab Services Pager [phone]

## 2014-04-17 NOTE — Progress Notes (Addendum)
[facility] PROGRESS NOTE  Filed Weights   04/15/14 0535 04/16/14 0407 04/17/14 0358  Weight: 86.5 kg (190 lb 11.2 oz) 86.8 kg (191 lb 5.8 oz) 84.5 kg (186 lb 4.6 oz)        Intake/Output Summary (Last 24 hours) at 04/17/14 0757 Last data filed at 04/17/14 0402  Gross per 24 hour  Intake    770 ml  Output   2451 ml  Net  -1681 ml     Assessment/Plan: Acute on Chronic diastolic CHF (congestive heart failure), NYHA class 4: - Pt cont to diuresis with  Lasix IV and metolazone, Cr has remained stable. - Has significant JVD and lower extremity edema. Will d/w cards. - keep leg elevated above heart. - weights 89.1 kb->88.7->86.5->84.5 kg, strict I and O's, moderate UOP. - b-met in am. Replete electrolytes as needed. - cont coreg, hydralazine, imdur and diuretics.  PVD (peripheral vascular disease): - s/p recent left fem-tib bypass 3.3.2015.  Possible UTI: - On Rocephin. - UC Gram negative rods.  Elevation in Troponin's: - Cardiac markers mildly positive, with renal failure and HF. - She has known significant coronary artery disease, treated medically. She has not had chest pain and her ECG does not have acute ischemic changes. - most likely due to renal and heart failure.  CKD (chronic kidney disease) stage 4, GFR 15-29 ml/min/Diabetic nephropathy: - At baseline. - She has indicated not wanting dialysis in the past.  HYPERTENSION - improved. - As she diurese BP might improve.   DIABETES, TYPE 2: - now controlled - Hbg A1c 7.6.     Code Status: Full  Condition: Guarded  Family Communication: Son at bedside. Admission, patients condition and plan of care including tests being ordered have been discussed with the patient and son who indicate understanding and agree with the plan and Code Status.  Disposition Plan: Admitted    Consultants:  Cardiology  VVS  Procedures: ECHO: none  Antibiotics:  none  HPI/Subjective: Feeling a lot  better.  Objective: Filed Vitals:   04/16/14 1440 04/16/14 1748 04/16/14 2014 04/17/14 0358  BP: 130/58 132/64 137/46 139/71  Pulse: 63  66 69  Temp: 98.6 F (37 C)  98 F (36.7 C) 98.2 F (36.8 C)  TempSrc: Oral  Oral Oral  Resp: _0 Height:      Weight:    84.5 kg (186 lb 4.6 oz)  SpO2: 97%  96% 96%     Exam:  General: Alert, awake, oriented x3, in no acute distress.  HEENT: No bruits, no goiter. +JVD Heart: Regular rate and rhythm, 2+ lower extremity edema.  Lungs: Good air movement, clear Abdomen: Soft, nontender, nondistended, positive bowel sounds.   Data Reviewed: Basic Metabolic Panel:  Recent Labs Lab 04/13/14 1444 04/14/14 0340 04/15/14 0325 04/16/14 0537 04/17/14 0434  NA 140 143 144 140 143  K 4.5 4.1 3.7 3.4* 3.8  CL 98 101 101 93* 96  CO2 _1 GLUCOSE 229* 162* 98 135* 102*  BUN 56* 59* 57* 54* 58*  CREATININE 2.78* 3.05* 3.07* 3.02* 3.11*  CALCIUM 9.9 9.6 9.3 9.4 9.9   Liver Function Tests: No results found for this basename: AST, ALT, ALKPHOS, BILITOT, PROT, ALBUMIN,  in the last 168 hours No results found for this basename: LIPASE, AMYLASE,  in the last 168 hours No results found for this basename: AMMONIA,  in the last 168 hours CBC:  Recent Labs Lab 04/13/14 1444  WBC 11.0*  HGB 10.1*  HCT 32.3*  MCV 92.0  PLT 218   Cardiac Enzymes:  Recent Labs Lab 04/13/14 1711 04/13/14 2229 04/14/14 0310 04/14/14 0830  TROPONINI 0.81* 0.69* 0.73* 0.69*   BNP (last 3 results)  Recent Labs  02/26/14 1230 04/13/14 1444  PROBNP 4820.0* 24938.0*   CBG:  Recent Labs Lab 04/16/14 0544 04/16/14 1057 04/16/14 1613 04/16/14 2056 04/17/14 0537  GLUCAP 141* 245* 276* 161* 107*    Recent Results (from the past 240 hour(s))  URINE CULTURE     Status: None   Collection Time    04/14/14  8:06 PM      Result Value Ref Range Status   Specimen Description URINE, RANDOM   Final   Special Requests NONE   Final    Culture  Setup Time     Final   Value: 04/15/2014 08:59     Performed at [place]     Final   Value: >=100,000 COLONIES/ML     Performed at [facility]   Culture     Final   Value: [place]     Performed at [facility]   Report Status PENDING   Incomplete     Studies: Dg Chest 1 View  04/16/2014   CLINICAL DATA:  CHF  EXAM: CHEST - 1 VIEW  COMPARISON:  DG CHEST 2 VIEW dated 04/13/2014  FINDINGS: Moderate cardiomegaly. Small bilateral pleural effusions improved. Upper lungs clear. No pneumothorax. Vascular congestion improved.  IMPRESSION: Bilateral effusions and vascular congestion are improved.   Electronically Signed   By: Maryclare Bean M.D.   On: 04/16/2014 16:49    Scheduled Meds: . aspirin EC  81 mg Oral Daily  . atorvastatin  40 mg Oral q morning - 10a  . carvedilol  12.5 mg Oral BID  . cefTRIAXone (ROCEPHIN)  IV  1 g Intravenous Q24H  . docusate sodium  100 mg Oral BID  . enoxaparin (LOVENOX) injection  30 mg Subcutaneous Q24H  . furosemide  80 mg Oral BID  . hydrALAZINE  10 mg Oral TID  . insulin aspart  0-5 Units Subcutaneous QHS  . insulin aspart  0-9 Units Subcutaneous TID WC  . insulin aspart  3 Units Subcutaneous TID WC  . isosorbide mononitrate  30 mg Oral Daily  . linagliptin  5 mg Oral Daily  . multivitamin  1 tablet Oral Daily  . polyethylene glycol  17 g Oral Daily  . sodium chloride  3 mL Intravenous Q12H   Continuous Infusions:    Charlynne Cousins  [facility] Pager [phone] If 8PM-8AM, please contact night-coverage at [email or web address], password [facility] 04/17/2014, 7:57 AM  LOS: 4 days

## 2014-04-18 LAB — BASIC METABOLIC PANEL
BUN: 59 mg/dL — ABNORMAL HIGH (ref 6–23)
CALCIUM: 10 mg/dL (ref 8.4–10.5)
CO2: 31 meq/L (ref 19–32)
Chloride: 92 mEq/L — ABNORMAL LOW (ref 96–112)
Creatinine, Ser: 3.42 mg/dL — ABNORMAL HIGH (ref 0.50–1.10)
GFR calc Af Amer: 13 mL/min — ABNORMAL LOW (ref >90)
GFR, EST NON AFRICAN AMERICAN: 11 mL/min — AB (ref >90)
GLUCOSE: 97 mg/dL (ref 70–99)
Potassium: 3.9 mEq/L (ref 3.7–5.3)
SODIUM: 141 meq/L (ref 137–147)

## 2014-04-18 LAB — GLUCOSE, CAPILLARY: Glucose-Capillary: 101 mg/dL — ABNORMAL HIGH (ref 70–99)

## 2014-04-18 LAB — PRO B NATRIURETIC PEPTIDE: Pro B Natriuretic peptide (BNP): 25295 pg/mL — ABNORMAL HIGH (ref 0–450)

## 2014-04-18 MED ORDER — ISOSORBIDE MONONITRATE ER 30 MG PO TB24: 30.0000 mg | ORAL_TABLET | Freq: Every day | ORAL | Status: DC

## 2014-04-18 MED ORDER — FUROSEMIDE 40 MG PO TABS: 60.0000 mg | ORAL_TABLET | Freq: Every morning | ORAL | Status: DC

## 2014-04-18 MED ORDER — CIPROFLOXACIN HCL 500 MG PO TABS: 500.0000 mg | ORAL_TABLET | Freq: Every day | ORAL | Status: DC

## 2014-04-18 MED ORDER — SODIUM CHLORIDE 0.9 % IV BOLUS (SEPSIS): 250.0000 mL | Freq: Once | INTRAVENOUS | Status: DC

## 2014-04-18 NOTE — Discharge Summary (Signed)
Physician Discharge Summary  Dorothy Bartlett JEH:[id number] DOB: 09/23/1926 DOA: 04/13/2014  PCP: Pcp Not In System  Admit date: 04/13/2014 Discharge date: 04/18/2014  Time spent: 40 minutes  Recommendations for Outpatient Follow-up:  1. Follow up with cardiology in 1 week. Check B-met and titrate HF medications as tolerate it.  BNP    Component Value Date/Time   PROBNP 24938.0* 04/13/2014 1444   Filed Weights   04/16/14 0407 04/17/14 0358 04/18/14 0409  Weight: 86.8 kg (191 lb 5.8 oz) 84.5 kg (186 lb 4.6 oz) 83.2 kg (183 lb 6.8 oz)     Discharge Diagnoses:  Principal Problem:   Acute on chronic diastolic CHF (congestive heart failure), NYHA class 4 Active Problems:   DIABETES, TYPE 2   HYPERTENSION   CKD (chronic kidney disease) stage 4, GFR 15-29 ml/min   PVD (peripheral vascular disease)   Non-ST elevation myocardial infarction (NSTEMI), initial episode of care   Acute exacerbation of CHF (congestive heart failure)   CHF (congestive heart failure)   Discharge Condition: stable  Diet recommendation: low sodium fluid restricted diet.   History of present illness:  80 y.o. female  With history of CHF, PVD, hypertension, diabetes mellitus that presents to the emergency department for shortness of breath. Her shortness of breath has been increasing over the past 2-3 weeks. She was in the nursing home for rehabilitation for approximately one month after her femoral tibial bypass. Patient was treated for CHF or in the nursing home. She denies any changes to her diet, denies any increased fluid intake or salt in her diet. Patient has been taking her medications as prescribed. Patient has been short of breath with PND and orthopnea as well as with exertion. She has been on nasal cannula since coming to the emergency department. She denies using nasal cannula were CPAP machine at home. Currently she denies any chest pain   Hospital Course:  Acute on Chronic diastolic CHF  (congestive heart failure), NYHA class 4:  - Pt started on Lasix IV and metolazone, Cr has remained stable.  - Has significant JVD and lower extremity edema. With improvement. - weights 89.1 kb->88.7->86.5->84.5 83.2 kg. - monitor electrolytes and repleted as needed.  - cont coreg, hydralazine, imdur and diuretics at home. - follow up with cards as an outpatient.  PVD (peripheral vascular disease):  - s/p recent left fem-tib bypass 3.3.2015.   Possible UTI:  - On Rocephin, change to UTI. - UC Enterobacter sensitive to Cipro.  Elevation in Troponin's:  - Cardiac markers mildly positive in the setting of renal failure and HF.  - She has known significant coronary artery disease, treated medically. She has not had chest pain and her ECG does not have acute ischemic changes.  - Most likely due to renal and heart failure.   CKD (chronic kidney disease) stage 4, GFR 15-29 ml/min/Diabetic nephropathy:  - At baseline.  - She has indicated not wanting dialysis in the past.   HYPERTENSION  - improved.  - As she diurese BP might improve.  - will need further titration of medications as an outpatinet.  DIABETES, TYPE 2:  - now controlled   Procedures:  CXR  Consultations:  cardiology  Discharge Exam: Filed Vitals:   04/18/14 0409  BP: 140/46  Pulse: 65  Temp: 98.5 F (36.9 C)  Resp: 18    General: See progress note  Discharge Instructions      Discharge Orders   Future Appointments Provider Department Dept Phone  04/20/2014 8:45 AM Rosetta Posner, MD [facility] [phone]   04/20/2014 11:10 AM Liliane Shi, PA-C [place] Office [phone]   07/06/2014 9:30 AM Sinclair Grooms, MD [facility] [phone]   Future Orders Complete By Expires   Diet - low sodium heart healthy  As directed    Increase activity slowly  As directed        Medication List         albuterol 108 (90 BASE) MCG/ACT  inhaler  Commonly known as:  PROVENTIL HFA;VENTOLIN HFA  Inhale 2 puffs into the lungs every 6 (six) hours as needed for wheezing or shortness of breath.     aspirin 81 MG tablet  Take 81 mg by mouth every morning.     atorvastatin 40 MG tablet  Commonly known as:  LIPITOR  Take 40 mg by mouth every morning.     carvedilol 12.5 MG tablet  Commonly known as:  COREG  Take 1 tablet (12.5 mg total) by mouth 2 (two) times daily.     ciprofloxacin 500 MG tablet  Commonly known as:  CIPRO  Take 1 tablet (500 mg total) by mouth daily with breakfast.     docusate sodium 100 MG capsule  Commonly known as:  COLACE  Take 100 mg by mouth 2 (two) times daily.     furosemide 40 MG tablet  Commonly known as:  LASIX  Take 1.5 tablets (60 mg total) by mouth every morning.     hydrALAZINE 10 MG tablet  Commonly known as:  APRESOLINE  Take 10 mg by mouth 3 (three) times daily.     insulin lispro 100 UNIT/ML injection  Commonly known as:  HUMALOG  Inject 5 Units into the skin 2 (two) times daily. Only takes if CBG >250.     isosorbide mononitrate 30 MG 24 hr tablet  Commonly known as:  IMDUR  Take 1 tablet (30 mg total) by mouth daily.     Melatonin 3 MG Tabs  Take 3 mg by mouth at bedtime.     RENA-VITE RX 1 MG Tabs  Take 1 tablet by mouth every morning.     sitaGLIPtin 25 MG tablet  Commonly known as:  JANUVIA  Take 25 mg by mouth daily.       No Known Allergies Follow-up Information   Follow up with [facility]. ([place] at discharge; services to start within 24-48 hours of discharge. )    Specialty:  Home Health Services   Contact information:   [address] [place] [address] [phone]        The results of significant diagnostics from this hospitalization (including imaging, microbiology, ancillary and laboratory) are listed below for reference.    Significant Diagnostic Studies: Dg Chest 1 View  04/16/2014    CLINICAL DATA:  CHF  EXAM: CHEST - 1 VIEW  COMPARISON:  DG CHEST 2 VIEW dated 04/13/2014  FINDINGS: Moderate cardiomegaly. Small bilateral pleural effusions improved. Upper lungs clear. No pneumothorax. Vascular congestion improved.  IMPRESSION: Bilateral effusions and vascular congestion are improved.   Electronically Signed   By: Maryclare Bean M.D.   On: 04/16/2014 16:49   Dg Chest 2 View  04/13/2014   CLINICAL DATA:  SHORTNESS OF BREATH  EXAM: CHEST  2 VIEW  COMPARISON:  DG CHEST 2 VIEW dated 01/02/2008  FINDINGS: Low lung volumes. Cardiac silhouette is enlarged. There  is prominence of the interstitial markings and peribronchial cuffing. Small right pleural effusion is appreciated. Blunting of left costophrenic angle. Mild increased density within the lung bases. Osseous structures unremarkable.  IMPRESSION: Pulmonary edema  Small right effusion, trace left effusion  Bibasilar atelectasis versus infiltrates right greater than left.   Electronically Signed   By: Margaree Mackintosh M.D.   On: 04/13/2014 15:16    Microbiology: Recent Results (from the past 240 hour(s))  URINE CULTURE     Status: None   Collection Time    04/14/14  8:06 PM      Result Value Ref Range Status   Specimen Description URINE, RANDOM   Final   Special Requests NONE   Final   Culture  Setup Time     Final   Value: 04/15/2014 08:59     Performed at [facility]     Final   Value: >=100,000 COLONIES/ML     Performed at [facility]   Culture     Final   Value: ENTEROBACTER AEROGENES     Performed at [facility]   Report Status 04/17/2014 FINAL   Final   Organism ID, Bacteria ENTEROBACTER AEROGENES   Final     Labs: Basic Metabolic Panel:  Recent Labs Lab 04/14/14 0340 04/15/14 0325 04/16/14 0537 04/17/14 0434 04/18/14 0452  NA 143 144 140 143 141  K 4.1 3.7 3.4* 3.8 3.9  CL 101 101 93* 96 92*  CO2 _0 GLUCOSE 162* 98 135* 102* 97  BUN 59* 57* 54* 58* 59*    CREATININE 3.05* 3.07* 3.02* 3.11* 3.42*  CALCIUM 9.6 9.3 9.4 9.9 10.0   Liver Function Tests: No results found for this basename: AST, ALT, ALKPHOS, BILITOT, PROT, ALBUMIN,  in the last 168 hours No results found for this basename: LIPASE, AMYLASE,  in the last 168 hours No results found for this basename: AMMONIA,  in the last 168 hours CBC:  Recent Labs Lab 04/13/14 1444  WBC 11.0*  HGB 10.1*  HCT 32.3*  MCV 92.0  PLT 218   Cardiac Enzymes:  Recent Labs Lab 04/13/14 1711 04/13/14 2229 04/14/14 0310 04/14/14 0830  TROPONINI 0.81* 0.69* 0.73* 0.69*   BNP: BNP (last 3 results)  Recent Labs  02/26/14 1230 04/13/14 1444  PROBNP 4820.0* 24938.0*   CBG:  Recent Labs Lab 04/17/14 0537 04/17/14 1136 04/17/14 1619 04/17/14 2053 04/18/14 0601  GLUCAP 107* 162* 142* 102* 101*    Signed:  Charlynne Cousins  [facility] 04/18/2014, 8:30 AM

## 2014-04-18 NOTE — Progress Notes (Signed)
[facility] PROGRESS NOTE  Filed Weights   04/16/14 0407 04/17/14 0358 04/18/14 0409  Weight: 86.8 kg (191 lb 5.8 oz) 84.5 kg (186 lb 4.6 oz) 83.2 kg (183 lb 6.8 oz)        Intake/Output Summary (Last 24 hours) at 04/18/14 [id number] Last data filed at 04/18/14 [id number]  Gross per 24 hour  Intake    123 ml  Output   2770 ml  Net  -2647 ml     Assessment/Plan: Acute on Chronic diastolic CHF (congestive heart failure), NYHA class 4: - Pt cont to diuresis, changed to oral lasix, mild increase in Cr. - No JVD and no lower extremity edema.  - weights 89.1 kb->88.7->86.5->84.5-> 83.2 kg - cont coreg, hydralazine, imdur and diuretics.  PVD (peripheral vascular disease): - s/p recent left fem-tib bypass 3.3.2015.  Possible UTI: - On Rocephin. - UC enterobacter sensitive to cipro.  Elevation in Troponin's: - Cardiac markers mildly positive, with renal failure and HF. - She has known significant coronary artery disease, treated medically. She has not had chest pain and her ECG does not have acute ischemic changes. - most likely due to renal and heart failure.  CKD (chronic kidney disease) stage 4, GFR 15-29 ml/min/Diabetic nephropathy: - At baseline. - She has indicated not wanting dialysis in the past.  HYPERTENSION - improved. - As she diurese BP might improve.   DIABETES, TYPE 2: - now controlled - Hbg A1c 7.6.     Code Status: Full  Condition: Guarded  Family Communication: Son at bedside. Admission, patients condition and plan of care including tests being ordered have been discussed with the patient and son who indicate understanding and agree with the plan and Code Status.  Disposition Plan: Admitted    Consultants:  Cardiology  [facility]  Procedures: ECHO: none  Antibiotics:  none  HPI/Subjective: Feeling a lot better.  Objective: Filed Vitals:   04/17/14 0358 04/17/14 1430 04/17/14 2011 04/18/14 0409  BP: 139/71 125/57 141/42 140/46  Pulse: 69 66  73 65  Temp: 98.2 F (36.8 C) 98.1 F (36.7 C) 98.2 F (36.8 C) 98.5 F (36.9 C)  TempSrc: Oral Oral Oral Oral  Resp: 18 20 18 18   Height:      Weight: 84.5 kg (186 lb 4.6 oz)   83.2 kg (183 lb 6.8 oz)  SpO2: 96% 97% 99% 96%     Exam:  General: Alert, awake, oriented x3, in no acute distress.  HEENT: No bruits, no goiter. - JVD Heart: Regular rate and rhythm, no lower extremity edema. Lungs: Good air movement, clear Abdomen: Soft, nontender, nondistended, positive bowel sounds.   Data Reviewed: Basic Metabolic Panel:  Recent Labs Lab 04/14/14 0340 04/15/14 0325 04/16/14 0537 04/17/14 0434 04/18/14 0452  NA 143 144 140 143 141  K 4.1 3.7 3.4* 3.8 3.9  CL 101 101 93* 96 92*  CO2 27 28 30 31 31   GLUCOSE 162* 98 135* 102* 97  BUN 59* 57* 54* 58* 59*  CREATININE 3.05* 3.07* 3.02* 3.11* 3.42*  CALCIUM 9.6 9.3 9.4 9.9 10.0   Liver Function Tests: No results found for this basename: AST, ALT, ALKPHOS, BILITOT, PROT, ALBUMIN,  in the last 168 hours No results found for this basename: LIPASE, AMYLASE,  in the last 168 hours No results found for this basename: AMMONIA,  in the last 168 hours CBC:  Recent Labs Lab 04/13/14 1444  WBC 11.0*  HGB 10.1*  HCT 32.3*  MCV 92.0  PLT 218  Cardiac Enzymes:  Recent Labs Lab 04/13/14 1711 04/13/14 2229 04/14/14 0310 04/14/14 0830  TROPONINI 0.81* 0.69* 0.73* 0.69*   BNP (last 3 results)  Recent Labs  02/26/14 1230 04/13/14 1444  PROBNP 4820.0* 24938.0*   CBG:  Recent Labs Lab 04/17/14 0537 04/17/14 1136 04/17/14 1619 04/17/14 2053 04/18/14 0601  GLUCAP 107* 162* 142* 102* 101*    Recent Results (from the past 240 hour(s))  URINE CULTURE     Status: None   Collection Time    04/14/14  8:06 PM      Result Value Ref Range Status   Specimen Description URINE, RANDOM   Final   Special Requests NONE   Final   Culture  Setup Time     Final   Value: 04/15/2014 08:59     Performed at [facility]    Colony Count     Final   Value: >=100,000 COLONIES/ML     Performed at [facility]   Culture     Final   Value: ENTEROBACTER AEROGENES     Performed at [facility]   Report Status 04/17/2014 FINAL   Final   Organism ID, Bacteria ENTEROBACTER AEROGENES   Final     Studies: Dg Chest 1 View  04/16/2014   CLINICAL DATA:  CHF  EXAM: CHEST - 1 VIEW  COMPARISON:  DG CHEST 2 VIEW dated 04/13/2014  FINDINGS: Moderate cardiomegaly. Small bilateral pleural effusions improved. Upper lungs clear. No pneumothorax. Vascular congestion improved.  IMPRESSION: Bilateral effusions and vascular congestion are improved.   Electronically Signed   By: Maryclare BeanArt  Hoss M.D.   On: 04/16/2014 16:49    Scheduled Meds: . aspirin EC  81 mg Oral Daily  . atorvastatin  40 mg Oral q morning - 10a  . carvedilol  12.5 mg Oral BID  . ciprofloxacin  500 mg Oral Q breakfast  . docusate sodium  100 mg Oral BID  . enoxaparin (LOVENOX) injection  30 mg Subcutaneous Q24H  . furosemide  80 mg Oral BID  . hydrALAZINE  10 mg Oral TID  . insulin aspart  0-5 Units Subcutaneous QHS  . insulin aspart  0-9 Units Subcutaneous TID WC  . insulin aspart  3 Units Subcutaneous TID WC  . isosorbide mononitrate  30 mg Oral Daily  . linagliptin  5 mg Oral Daily  . multivitamin  1 tablet Oral Daily  . polyethylene glycol  17 g Oral Daily  . sodium chloride  3 mL Intravenous Q12H   Continuous Infusions:    Dorothy Bartlett  [facility] Pager [phone] If 8PM-8AM, please contact night-coverage at [email or web address], password [email or web address] 04/18/2014, 8:12 AM  LOS: 5 days

## 2014-04-19 ENCOUNTER — Encounter: Payer: Self-pay | Admitting: Vascular Surgery

## 2014-04-20 ENCOUNTER — Encounter: Payer: Self-pay | Admitting: Physician Assistant

## 2014-04-20 ENCOUNTER — Encounter: Payer: Self-pay | Admitting: Vascular Surgery

## 2014-04-20 ENCOUNTER — Ambulatory Visit (INDEPENDENT_AMBULATORY_CARE_PROVIDER_SITE_OTHER): Payer: Medicare Other | Admitting: Physician Assistant

## 2014-04-20 ENCOUNTER — Ambulatory Visit (INDEPENDENT_AMBULATORY_CARE_PROVIDER_SITE_OTHER): Payer: Self-pay | Admitting: Vascular Surgery

## 2014-04-20 ENCOUNTER — Telehealth: Payer: Self-pay | Admitting: *Deleted

## 2014-04-20 VITALS — BP 132/57 | HR 70 | Ht 59.0 in | Wt 183.0 lb

## 2014-04-20 VITALS — BP 112/70 | HR 70 | Ht 59.0 in | Wt 181.0 lb

## 2014-04-20 DIAGNOSIS — I739 Peripheral vascular disease, unspecified: Secondary | ICD-10-CM

## 2014-04-20 DIAGNOSIS — I5042 Chronic combined systolic (congestive) and diastolic (congestive) heart failure: Secondary | ICD-10-CM

## 2014-04-20 DIAGNOSIS — I2510 Atherosclerotic heart disease of native coronary artery without angina pectoris: Secondary | ICD-10-CM

## 2014-04-20 DIAGNOSIS — I5032 Chronic diastolic (congestive) heart failure: Secondary | ICD-10-CM

## 2014-04-20 DIAGNOSIS — I10 Essential (primary) hypertension: Secondary | ICD-10-CM

## 2014-04-20 DIAGNOSIS — E785 Hyperlipidemia, unspecified: Secondary | ICD-10-CM

## 2014-04-20 DIAGNOSIS — Z48812 Encounter for surgical aftercare following surgery on the circulatory system: Secondary | ICD-10-CM

## 2014-04-20 LAB — BASIC METABOLIC PANEL
BUN: 54 mg/dL — ABNORMAL HIGH (ref 6–23)
CO2: 33 mEq/L — ABNORMAL HIGH (ref 19–32)
Calcium: 9.5 mg/dL (ref 8.4–10.5)
Chloride: 90 mEq/L — ABNORMAL LOW (ref 96–112)
Creatinine, Ser: 4.3 mg/dL — ABNORMAL HIGH (ref 0.4–1.2)
GFR: 12.55 mL/min — CL (ref >60.00)
GLUCOSE: 238 mg/dL — AB (ref 70–99)
POTASSIUM: 4.3 meq/L (ref 3.5–5.1)
Sodium: 138 mEq/L (ref 135–145)

## 2014-04-20 MED ORDER — FUROSEMIDE 40 MG PO TABS: 40.0000 mg | ORAL_TABLET | Freq: Every day | ORAL | Status: DC

## 2014-04-20 NOTE — Progress Notes (Signed)
[address] [place], [place]  [address] Phone: [phone] Fax:  [phone]  Date:  04/20/2014   ID:  Claretta FraiseCorine H Morrissette, DOB Jun 13, 1926, MRN [id number]  PCP:  Pcp Not In System  Cardiologist:  Dr. Verdis PrimeHenry Smith     History of Present Illness: Jeanae Rushie ChestnutH Alaimo is a 80 y.o. female with a hx of CAD s/p prior MI in 1995 treated with angioplasty of the diagonal, ramus and apical LAD, diastolic CHF, HTN, CKD, RBBB, O9GET2DM, carotid stenosis, PAD.  She recently underwent L Fem-Tib Bypass in 01/2014.  Postop course was fairly uneventful.  She was recently admitted 4/14-4/19 with acute on chronic combined systolic and diastolic CHF in the setting of acute on chronic renal failure complicated by non-STEMI. Medical therapy was recommended for CAD secondary to chronic kidney disease and increased risk of contrast-induced nephropathy. She was diuresed with IV Lasix and this was augmented with metolazone. She was also treated for UTI. She was not placed on Plavix due to anemia.  She is here with her daughter. She is doing well. Her breathing is much improved. She denies chest pain. She denies syncope or near syncope. She denies orthopnea, PND. LE edema is much improved. She still has some residual edema on the left.  Studies:  - LHC (11/2004):  Inferior HK, EF 60%, proximal LAD 50%, ostial D1 80%, mid D1 95%, mid LAD 50-60%, distal LAD at the apex 90%, OM1 occluded (fills late by left to left collaterals), RCA 75%. Medical therapy recommended.    - Myoview (01/29/14):  Ant-Lat MI with very small area of peri-infarct ischemia (ant wall), EF 40%; High Risk - findings c/w known disease and Med Rx continued  - Echocardiogram (04/2005):EF 55-65%, moderate LVH, mild aortic stenosis (mean gradient 9), moderate LAE.    - Echo (02/11/14):  Mild LVH, EF 45-50%, Gr 1 DD, Mean AV gradient 8 mmHg, mild MR, mild LAE, PASP 41 mmHg.  - Carotid US (12/2013): < 60% RCA, < 40% LICA.   Recent Labs: 04/13/2014: Hemoglobin  10.1*  04/18/2014: Creatinine 3.42*; Potassium 3.9; Pro B Natriuretic peptide (BNP) 25295.0*   Wt Readings from Last 3 Encounters:  04/20/14 181 lb (82.101 kg)  04/20/14 183 lb (83.008 kg)  04/18/14 183 lb 6.8 oz (83.2 kg)     Past Medical History  Diagnosis Date  . Hypertension   . MI (myocardial infarction) 1995  . Asthma   . Coronary artery disease     a. s/p MI 1995 tx w/ PTCA to Dx, Ramus, ap LAD;  b. LHC (11/2004):  Inferior HK, EF 60%, proximal LAD 50%, ostial D1 80%, mid D1 95%, mid LAD 50-60%, distal LAD at the apex 90%, OM1 occluded (fills late by left to left collaterals), RCA 75%. Medical therapy recommended.;  c. Lexiscan Myoview (12/2013):  Prior ant-lat scar with very small area of peri-infarct ischemia in ant wall, EF 40 (high risk)   . Hx of echocardiogram   . Chronic diastolic CHF (congestive heart failure)     a. Echocardiogram (04/2005):EF 55-65%, moderate LVH, mild aortic stenosis (mean gradient 9), moderate LAE.  [name]. Hyperlipidemia   . PAD (peripheral artery disease)   . Carotid stenosis     Carotid US (12/2013): < 60% RCA, < 40% LICA.  [name]. CKD (chronic kidney disease), stage IV     notes 02/22/2014; Dr. Hyman HopesWebb  . Type II diabetes mellitus   . Arthritis   . PVD (peripheral vascular disease)  Current Outpatient Prescriptions  Medication Sig Dispense Refill  . albuterol (PROVENTIL HFA;VENTOLIN HFA) 108 (90 BASE) MCG/ACT inhaler Inhale 2 puffs into the lungs every 6 (six) hours as needed for wheezing or shortness of breath.      [name]. aspirin 81 MG tablet Take 81 mg by mouth every morning.       [name]. atorvastatin (LIPITOR) 40 MG tablet Take 40 mg by mouth every morning.       . B Complex-C-Folic Acid (RENA-VITE RX) 1 MG TABS Take 1 tablet by mouth every morning.       . carvedilol (COREG) 12.5 MG tablet Take 1 tablet (12.5 mg total) by mouth 2 (two) times daily.  180 tablet  1  . ciprofloxacin (CIPRO) 500 MG tablet Take 1 tablet (500 mg total) by mouth daily with breakfast.   2 tablet  0  . docusate sodium (COLACE) 100 MG capsule Take 100 mg by mouth 2 (two) times daily.      . furosemide (LASIX) 40 MG tablet Take 1.5 tablets (60 mg total) by mouth every morning.  60 tablet  0  . hydrALAZINE (APRESOLINE) 10 MG tablet Take 10 mg by mouth 3 (three) times daily.      . insulin lispro (HUMALOG) 100 UNIT/ML injection Inject 5 Units into the skin 2 (two) times daily. Only takes if CBG >250.      [name]. isosorbide mononitrate (IMDUR) 30 MG 24 hr tablet Take 1 tablet (30 mg total) by mouth daily.  30 tablet  0  . Melatonin 3 MG TABS Take 3 mg by mouth at bedtime.       . sitaGLIPtin (JANUVIA) 25 MG tablet Take 25 mg by mouth daily.       No current facility-administered medications for this visit.    Allergies:   Review of patient's allergies indicates no known allergies.   Social History:  The patient  reports that she has never smoked. She has never used smokeless tobacco. She reports that she does not drink alcohol or use illicit drugs.   Family History:  The patient's family history includes Diabetes in her father; Hyperlipidemia in her daughter and son; Hypertension in her daughter and father.   ROS:  Please see the history of present illness.   She's had a cough with clear sputum production for the last couple days. She denies fever or purulent sputum.  All other systems reviewed and negative.   PHYSICAL EXAM: VS:  BP 112/70  Pulse 70  Ht 4\' 11"  (1.499 m)  Wt 181 lb (82.101 kg)  BMI 36.54 kg/m2 Well nourished, well developed, in no acute distress HEENT: normal Neck: no JVDat 90 Cardiac:  normal S1, S2; RRR; no murmur Lungs:  Clear to auscultation bilaterally, no wheezing, rhonchi or rales Abd: soft, nontender, no hepatomegaly Ext: no edemaon the right, trace-1+ edema on the left, incision healing without discharge Skin: warm and dry Neuro:  CNs 2-12 intact, no focal abnormalities noted  EKG:  NSR, HR 70, inferior Q waves, RBBB   ASSESSMENT AND  PLAN:  1. Chronic Combined Systolic and Diastolic CHF:  Volume stable. Continue current therapy. We discussed the importance of daily weights and when to call for weight gain. Check follow up basic metabolic panel. 2. CAD:  No angina. Recent non-STEMI likely type II in the setting of acute on chronic combined systolic and diastolic CHF in the setting of chronic kidney disease. Medical therapy has been recommended. Continue ASA, beta blocker, statin. 3. Hypertension:  Controlled.   4. Hyperlipidemia:  Continue statin. 5. Chronic Kidney Disease:  Check basic metabolic panel today.  Continue follow up with Dr. Hyman Hopes.  6. PAD: Continue follow up with [facility]. 7. Disposition:  Follow up with me in 3-4 weeks.  She will then see Dr. Katrinka Blazing in July.  Signed, Tereso Newcomer, PA-C  04/20/2014 11:19 AM

## 2014-04-20 NOTE — Addendum Note (Signed)
Addended by: Adria DillELDRIDGE-LEWIS, Jahmere Bramel L on: 04/20/2014 11:14 AM   Modules accepted: Orders

## 2014-04-20 NOTE — Telephone Encounter (Signed)
s/w pt's daughter Harriett Sineancy about pt's lab results and to hold lasix x 2 days Wed, Thurs. re-start lasix 40 mg daily as of 04/23/14 with a BMET 04/23/14 as well. Daughter advised to have pt weigh daily and call if wt up 3 # x 1 day, sob, edma.

## 2014-04-20 NOTE — Patient Instructions (Signed)
LAB WORK TODAY, BMET  NO CHANGES WERE MADE WITH YOUR MEDICATIONS  YOU HAVE A FOLLOW UP WITH [facility]/17/14 @ 11:30

## 2014-04-20 NOTE — Progress Notes (Signed)
Here today for followup of her left femoral to posterior tibial in situ bypass on 1225. She was admitted to the hospital last week for approximately 5 days sounds like a flareup of her congestive heart failure. She looks quite good today. She has minimal swelling bilaterally. She does have complete healing of all her surgical incisions on the left with some slight slow healing of the distal calf incision. She does have an easily palpable in situ graft pulse.  Is complete resolution profound ischemia she had in her left foot.  Stable postop. We'll see her in 3 months with noninvasive vascular lab study

## 2014-04-23 ENCOUNTER — Telehealth: Payer: Self-pay | Admitting: *Deleted

## 2014-04-23 ENCOUNTER — Other Ambulatory Visit (INDEPENDENT_AMBULATORY_CARE_PROVIDER_SITE_OTHER): Payer: Medicare Other

## 2014-04-23 DIAGNOSIS — I5032 Chronic diastolic (congestive) heart failure: Secondary | ICD-10-CM

## 2014-04-23 DIAGNOSIS — N184 Chronic kidney disease, stage 4 (severe): Secondary | ICD-10-CM

## 2014-04-23 DIAGNOSIS — I5042 Chronic combined systolic (congestive) and diastolic (congestive) heart failure: Secondary | ICD-10-CM

## 2014-04-23 DIAGNOSIS — I10 Essential (primary) hypertension: Secondary | ICD-10-CM

## 2014-04-23 LAB — BASIC METABOLIC PANEL
BUN: 64 mg/dL — AB (ref 6–23)
CHLORIDE: 90 meq/L — AB (ref 96–112)
CO2: 29 meq/L (ref 19–32)
CREATININE: 4.1 mg/dL — AB (ref 0.4–1.2)
Calcium: 9.4 mg/dL (ref 8.4–10.5)
GFR: 13.26 mL/min — CL (ref >60.00)
Glucose, Bld: 179 mg/dL — ABNORMAL HIGH (ref 70–99)
Potassium: 3.5 mEq/L (ref 3.5–5.1)
Sodium: 131 mEq/L — ABNORMAL LOW (ref 135–145)

## 2014-04-23 NOTE — Telephone Encounter (Signed)
S/w pt's daughter Harriett Sineancy about lab results and explained labs were shown to Dr. Narda Rutherfordosss (DOD) today and due to BUN today 64 which is up from 4/21 of 54, creatinine today 4.1, on 4/21 was 4.3. Daughter advised per Dr. Tenny Crawoss pt needs to HOLD lasix until further advised and to monitor wt and call if wt up 2-3# x 1 day or more sob, or edema. Harriett Sineancy also advised per Dr. Tenny Crawoss that pt needs to come in Monday AM 04/26/14 for BMET, CBC, BNP, Harriett Sineancy said ok and she will try to have her mom here between 9 and 9:30, I said that was fine. I also explained per Dr. Tenny Crawoss that pt needs to be seen early next week with Nephrology, which I stated that  I s/w Shequina who is Dr. Marland McalpineWebb's nurse about pt's lab results and the need for an appt early next week per Dr. Tenny Crawoss. Rodman CompShequina said she will call pt's daughter Harriett Sineancy on Monday and to make an appt once she shows the lab results to Dr. Hyman HopesWebb. Harriett Sineancy verbalized understanding to Plan of Care

## 2014-04-26 ENCOUNTER — Telehealth: Payer: Self-pay | Admitting: *Deleted

## 2014-04-26 ENCOUNTER — Other Ambulatory Visit (INDEPENDENT_AMBULATORY_CARE_PROVIDER_SITE_OTHER): Payer: Medicare Other

## 2014-04-26 DIAGNOSIS — N184 Chronic kidney disease, stage 4 (severe): Secondary | ICD-10-CM

## 2014-04-26 DIAGNOSIS — D638 Anemia in other chronic diseases classified elsewhere: Secondary | ICD-10-CM

## 2014-04-26 DIAGNOSIS — I5042 Chronic combined systolic (congestive) and diastolic (congestive) heart failure: Secondary | ICD-10-CM

## 2014-04-26 DIAGNOSIS — I5032 Chronic diastolic (congestive) heart failure: Secondary | ICD-10-CM

## 2014-04-26 LAB — CBC WITH DIFFERENTIAL/PLATELET
BASOS ABS: 0 10*3/uL (ref 0.0–0.1)
Basophils Relative: 0.1 % (ref 0.0–3.0)
Eosinophils Absolute: 0.1 10*3/uL (ref 0.0–0.7)
Eosinophils Relative: 1 % (ref 0.0–5.0)
HCT: 27.2 % — ABNORMAL LOW (ref 36.0–46.0)
Hemoglobin: 8.8 g/dL — ABNORMAL LOW (ref 12.0–15.0)
LYMPHS PCT: 18.3 % (ref 12.0–46.0)
Lymphs Abs: 1.6 10*3/uL (ref 0.7–4.0)
MCHC: 32.3 g/dL (ref 30.0–36.0)
MCV: 89.6 fl (ref 78.0–100.0)
MONOS PCT: 8.2 % (ref 3.0–12.0)
Monocytes Absolute: 0.7 10*3/uL (ref 0.1–1.0)
NEUTROS PCT: 72.4 % (ref 43.0–77.0)
Neutro Abs: 6.5 10*3/uL (ref 1.4–7.7)
PLATELETS: 186 10*3/uL (ref 150.0–400.0)
RBC: 3.03 Mil/uL — ABNORMAL LOW (ref 3.87–5.11)
RDW: 15 % — AB (ref 11.5–14.6)
WBC: 8.9 10*3/uL (ref 4.5–10.5)

## 2014-04-26 LAB — BASIC METABOLIC PANEL
BUN: 78 mg/dL — AB (ref 6–23)
CALCIUM: 9.4 mg/dL (ref 8.4–10.5)
CO2: 30 mEq/L (ref 19–32)
CREATININE: 3.9 mg/dL — AB (ref 0.4–1.2)
Chloride: 92 mEq/L — ABNORMAL LOW (ref 96–112)
GFR: 14.13 mL/min — AB (ref >60.00)
GLUCOSE: 211 mg/dL — AB (ref 70–99)
Potassium: 3.5 mEq/L (ref 3.5–5.1)
Sodium: 134 mEq/L — ABNORMAL LOW (ref 135–145)

## 2014-04-26 LAB — BRAIN NATRIURETIC PEPTIDE: PRO B NATRI PEPTIDE: 2065 pg/mL — AB (ref 0.0–100.0)

## 2014-04-26 NOTE — Telephone Encounter (Signed)
s/w pt's son about lab results and to continue to hold lasix. Will get repeat bmet, cbc w/diff 04/28/14, will fax results to Dr. Hyman HopesWebb (PCP). Son said thank you and they will see us on 4/29 for lab work.

## 2014-04-27 ENCOUNTER — Ambulatory Visit: Payer: Medicare Other | Admitting: Interventional Cardiology

## 2014-04-28 ENCOUNTER — Telehealth: Payer: Self-pay | Admitting: *Deleted

## 2014-04-28 ENCOUNTER — Other Ambulatory Visit (INDEPENDENT_AMBULATORY_CARE_PROVIDER_SITE_OTHER): Payer: Medicare Other

## 2014-04-28 DIAGNOSIS — N184 Chronic kidney disease, stage 4 (severe): Secondary | ICD-10-CM

## 2014-04-28 DIAGNOSIS — I5032 Chronic diastolic (congestive) heart failure: Secondary | ICD-10-CM

## 2014-04-28 DIAGNOSIS — D638 Anemia in other chronic diseases classified elsewhere: Secondary | ICD-10-CM

## 2014-04-28 LAB — CBC WITH DIFFERENTIAL/PLATELET
BASOS PCT: 0.1 % (ref 0.0–3.0)
Basophils Absolute: 0 10*3/uL (ref 0.0–0.1)
EOS PCT: 1.2 % (ref 0.0–5.0)
Eosinophils Absolute: 0.1 10*3/uL (ref 0.0–0.7)
HCT: 28.1 % — ABNORMAL LOW (ref 36.0–46.0)
HEMOGLOBIN: 9.1 g/dL — AB (ref 12.0–15.0)
Lymphocytes Relative: 19.5 % (ref 12.0–46.0)
Lymphs Abs: 1.5 10*3/uL (ref 0.7–4.0)
MCHC: 32.5 g/dL (ref 30.0–36.0)
MCV: 89.1 fl (ref 78.0–100.0)
MONO ABS: 0.7 10*3/uL (ref 0.1–1.0)
Monocytes Relative: 8.8 % (ref 3.0–12.0)
Neutro Abs: 5.5 10*3/uL (ref 1.4–7.7)
Neutrophils Relative %: 70.4 % (ref 43.0–77.0)
Platelets: 198 10*3/uL (ref 150.0–400.0)
RBC: 3.15 Mil/uL — AB (ref 3.87–5.11)
RDW: 14.9 % — ABNORMAL HIGH (ref 11.5–14.6)
WBC: 7.8 10*3/uL (ref 4.5–10.5)

## 2014-04-28 LAB — BASIC METABOLIC PANEL
BUN: 83 mg/dL — ABNORMAL HIGH (ref 6–23)
CHLORIDE: 95 meq/L — AB (ref 96–112)
CO2: 31 meq/L (ref 19–32)
Calcium: 9.6 mg/dL (ref 8.4–10.5)
Creatinine, Ser: 3.9 mg/dL — ABNORMAL HIGH (ref 0.4–1.2)
GFR: 14.09 mL/min — CL (ref >60.00)
Glucose, Bld: 195 mg/dL — ABNORMAL HIGH (ref 70–99)
Potassium: 3.6 mEq/L (ref 3.5–5.1)
SODIUM: 136 meq/L (ref 135–145)

## 2014-04-28 NOTE — Telephone Encounter (Signed)
s/w pt's daughter Harriett Sineancy who is caregiver to the pt, has been notfied about lab results and to resume lasix 40 mg daily, bmet 05/05/14. Harriett Sineancy verbalized understanding to Plan of Care.

## 2014-05-04 ENCOUNTER — Telehealth: Payer: Self-pay | Admitting: Interventional Cardiology

## 2014-05-04 NOTE — Telephone Encounter (Signed)
ERRO

## 2014-05-05 ENCOUNTER — Ambulatory Visit (INDEPENDENT_AMBULATORY_CARE_PROVIDER_SITE_OTHER): Payer: Medicare Other | Admitting: *Deleted

## 2014-05-05 DIAGNOSIS — I5032 Chronic diastolic (congestive) heart failure: Secondary | ICD-10-CM

## 2014-05-05 LAB — CBC WITH DIFFERENTIAL/PLATELET
BASOS ABS: 0 10*3/uL (ref 0.0–0.1)
Basophils Relative: 0.5 % (ref 0.0–3.0)
EOS ABS: 0.1 10*3/uL (ref 0.0–0.7)
Eosinophils Relative: 0.7 % (ref 0.0–5.0)
HEMATOCRIT: 28.4 % — AB (ref 36.0–46.0)
Hemoglobin: 9.1 g/dL — ABNORMAL LOW (ref 12.0–15.0)
LYMPHS ABS: 1.6 10*3/uL (ref 0.7–4.0)
Lymphocytes Relative: 19.8 % (ref 12.0–46.0)
MCHC: 32.1 g/dL (ref 30.0–36.0)
MCV: 90.2 fl (ref 78.0–100.0)
Monocytes Absolute: 0.7 10*3/uL (ref 0.1–1.0)
Monocytes Relative: 8.3 % (ref 3.0–12.0)
Neutro Abs: 5.6 10*3/uL (ref 1.4–7.7)
Neutrophils Relative %: 70.7 % (ref 43.0–77.0)
PLATELETS: 220 10*3/uL (ref 150.0–400.0)
RBC: 3.15 Mil/uL — ABNORMAL LOW (ref 3.87–5.11)
RDW: 14.8 % (ref 11.5–15.5)
WBC: 8 10*3/uL (ref 4.0–10.5)

## 2014-05-05 LAB — BASIC METABOLIC PANEL
BUN: 71 mg/dL — AB (ref 6–23)
CHLORIDE: 99 meq/L (ref 96–112)
CO2: 26 mEq/L (ref 19–32)
Calcium: 9.4 mg/dL (ref 8.4–10.5)
Creatinine, Ser: 3.4 mg/dL — ABNORMAL HIGH (ref 0.4–1.2)
GFR: 16.35 mL/min — ABNORMAL LOW (ref >60.00)
GLUCOSE: 231 mg/dL — AB (ref 70–99)
Potassium: 3.9 mEq/L (ref 3.5–5.1)
Sodium: 136 mEq/L (ref 135–145)

## 2014-05-06 ENCOUNTER — Telehealth: Payer: Self-pay | Admitting: *Deleted

## 2014-05-06 NOTE — Telephone Encounter (Signed)
pt's daughter Harriett Sineancy aware of lab results. Harriett SineNancy asked for lab results to be faxed over to Dr. Hyman [facility]opesWebb with [facility]. I told Harriett Sineancy no problem and I will take care of that today. Harriett Sineancy said thank you.

## 2014-05-10 ENCOUNTER — Other Ambulatory Visit: Payer: Self-pay | Admitting: Adult Health

## 2014-05-12 ENCOUNTER — Ambulatory Visit (INDEPENDENT_AMBULATORY_CARE_PROVIDER_SITE_OTHER): Payer: 59 | Admitting: Podiatry

## 2014-05-12 ENCOUNTER — Encounter: Payer: Self-pay | Admitting: Podiatry

## 2014-05-12 ENCOUNTER — Encounter (HOSPITAL_COMMUNITY)
Admission: RE | Admit: 2014-05-12 | Discharge: 2014-05-12 | Disposition: A | Discharge status: Home / Self Care | Payer: Medicare Other | Source: Ambulatory Visit | Attending: Nephrology | Admitting: Nephrology

## 2014-05-12 VITALS — BP 177/79 | HR 62 | Ht <= 58 in | Wt 176.0 lb

## 2014-05-12 DIAGNOSIS — D638 Anemia in other chronic diseases classified elsewhere: Secondary | ICD-10-CM | POA: Insufficient documentation

## 2014-05-12 DIAGNOSIS — N183 Chronic kidney disease, stage 3 unspecified: Secondary | ICD-10-CM | POA: Diagnosis not present

## 2014-05-12 DIAGNOSIS — M79609 Pain in unspecified limb: Secondary | ICD-10-CM

## 2014-05-12 DIAGNOSIS — L03039 Cellulitis of unspecified toe: Secondary | ICD-10-CM | POA: Insufficient documentation

## 2014-05-12 DIAGNOSIS — B351 Tinea unguium: Secondary | ICD-10-CM

## 2014-05-12 DIAGNOSIS — I739 Peripheral vascular disease, unspecified: Secondary | ICD-10-CM

## 2014-05-12 DIAGNOSIS — M79606 Pain in leg, unspecified: Secondary | ICD-10-CM

## 2014-05-12 MED ORDER — DARBEPOETIN ALFA-POLYSORBATE 500 MCG/ML IJ SOLN
40.0000 ug | INTRAMUSCULAR | Status: DC
  Filled 2014-05-12: qty 1

## 2014-05-12 MED ORDER — DARBEPOETIN ALFA-POLYSORBATE 40 MCG/0.4ML IJ SOLN
INTRAMUSCULAR | Status: AC
  Administered 2014-05-12: 40 ug
  Filled 2014-05-12: qty 0.4

## 2014-05-12 NOTE — Progress Notes (Signed)
Subjective: 80 year old female presents accompanied by her daughter, aided by walker for diabetic foot care.  Blood sugar was 134 this morning. She was consulted by vascular surgeon and had done a procedure in February 2015.  Since the procedure she is able to move the left foot better.   Objective: Vascular: Pedal pulses are not palpable. Excess foot and leg edema on left. Old surgical incision line on left lower limb has rough crust formation.  Dermatologic: Discolored ingrown nail right lateral border with drainage.  Neurologic: All epicritic sensations grossly intact with hyperalgic foot left.  Orthopedic: Rectus foot without gross deformity.   Assessment:  Status post vascular reconstruction left lower limb. Onychomycosis x 10. Ingrown nail with paronychia right lateral border. Diabetic under control.   Plan:  All nails debrided. Removed ingrown right lateral nail and cleansed with Betadine solution. Home care instruction given to her daughter.  Advised to get compression socks for left lower limb edema.  May benefit from Diabetic shoes.

## 2014-05-12 NOTE — Patient Instructions (Signed)
Seen for painful toes. All nails debrided. Ingrown nail removed right great toe.  Need to wear compression socks. May bring PCP certificate for diabetic shoes.  Return in 3 months for DM foot care.

## 2014-05-13 ENCOUNTER — Encounter (HOSPITAL_COMMUNITY): Payer: Medicare Other

## 2014-05-17 ENCOUNTER — Ambulatory Visit (INDEPENDENT_AMBULATORY_CARE_PROVIDER_SITE_OTHER): Payer: Medicare Other | Admitting: Physician Assistant

## 2014-05-17 ENCOUNTER — Encounter: Payer: Self-pay | Admitting: Physician Assistant

## 2014-05-17 VITALS — BP 130/68 | HR 67 | Ht <= 58 in | Wt 182.0 lb

## 2014-05-17 DIAGNOSIS — N184 Chronic kidney disease, stage 4 (severe): Secondary | ICD-10-CM

## 2014-05-17 DIAGNOSIS — I2510 Atherosclerotic heart disease of native coronary artery without angina pectoris: Secondary | ICD-10-CM

## 2014-05-17 DIAGNOSIS — I739 Peripheral vascular disease, unspecified: Secondary | ICD-10-CM

## 2014-05-17 DIAGNOSIS — I10 Essential (primary) hypertension: Secondary | ICD-10-CM

## 2014-05-17 DIAGNOSIS — I5042 Chronic combined systolic (congestive) and diastolic (congestive) heart failure: Secondary | ICD-10-CM

## 2014-05-17 NOTE — Progress Notes (Signed)
[address] [place], [place]  [address] Phone: [phone] Fax:  [phone]  Date:  05/17/2014   ID:  Dorothy Bartlett.retta FraiseCorine Dorothy Bartlett, DOB [birth year], MRN [id number]  PCP:  Dr. Ludwig ClarksMoreira Cardiologist:  Dr. Verdis PrimeHenry Bartlett     History of Present Illness: Dorothy Rushie ChestnutH Bartlett is a 80 y.o. female with a hx of CAD s/p prior MI in 1995 treated with angioplasty of the diagonal, ramus and apical LAD, diastolic CHF, HTN, CKD, RBBB, N0UVT2DM, carotid stenosis, PAD.  She recently underwent L Fem-Tib Bypass in 01/2014.  Postop course was fairly uneventful.  Admitted 03/2014 with a/c combined systolic and diastolic CHF in the setting of acute on chronic renal failure complicated by a non-STEMI. Medical Rx was recommended for CAD secondary to chronic kidney disease and increased risk of contrast-induced nephropathy.   I saw her in follow up 4/21.  She was improved and volume was stable.  Creatinine did return elevated and we adjusted her diuretics for several days.  She returns for close follow up.  She is doing well.  The podiatrist recommended LE compression stockings for her edema.  She has had a hard time getting them on.  She notes her edema is better overall.  She continues to feel good. She denies significant dyspnea.  Denies chest pain.  Denies syncope.  She denies orthopnea, PND, edema.     Studies:  - LHC (11/2004):  Inferior HK, EF 60%, proximal LAD 50%, ostial D1 80%, mid D1 95%, mid LAD 50-60%, distal LAD at the apex 90%, OM1 occluded (fills late by left to left collaterals), RCA 75%. Medical therapy recommended.   - Myoview (01/29/14):  Ant-Lat MI with very small area of peri-infarct ischemia (ant wall), EF 40%; High Risk - findings c/w known disease and Med Rx continued  - Echocardiogram (04/2005):EF 55-65%, moderate LVH, mild aortic stenosis (mean gradient 9), moderate LAE.    - Echo (02/11/14):  Mild LVH, EF 45-50%, Gr 1 DD, Mean AV gradient 8 mmHg, mild MR, mild LAE, PASP 41 mmHg.  - Carotid US (12/2013): <  60% RCA, < 40% LICA.   Recent Labs: 04/26/2014: Pro B Natriuretic peptide (BNP) 2065.0*  05/05/2014: Creatinine 3.4*; Hemoglobin 9.1*; Potassium 3.9   Wt Readings from Last 3 Encounters:  05/17/14 182 lb (82.555 kg)  05/12/14 176 lb (79.833 kg)  04/20/14 181 lb (82.101 kg)     Past Medical History  Diagnosis Date  . Hypertension   . MI (myocardial infarction) 1995  . Asthma   . Coronary artery disease     a. s/p MI 1995 tx w/ PTCA to Dx, Ramus, ap LAD;  b. LHC (11/2004):  Inferior HK, EF 60%, proximal LAD 50%, ostial D1 80%, mid D1 95%, mid LAD 50-60%, distal LAD at the apex 90%, OM1 occluded (fills late by left to left collaterals), RCA 75%. Medical therapy recommended.;  c. Lexiscan Myoview (12/2013):  Prior ant-lat scar with very small area of peri-infarct ischemia in ant wall, EF 40 (high risk)   . Hx of echocardiogram   . Chronic diastolic CHF (congestive heart failure)     a. Echocardiogram (04/2005):EF 55-65%, moderate LVH, mild aortic stenosis (mean gradient 9), moderate LAE.  [name]. Hyperlipidemia   . PAD (peripheral artery disease)   . Carotid stenosis     Carotid US (12/2013): < 60% RCA, < 40% LICA.  [name]. CKD (chronic kidney disease), stage IV     notes 02/22/2014; Dr. Hyman HopesWebb  . Type II diabetes  mellitus   . Arthritis   . PVD (peripheral vascular disease)     Current Outpatient Prescriptions  Medication Sig Dispense Refill  . albuterol (PROVENTIL HFA;VENTOLIN HFA) 108 (90 BASE) MCG/ACT inhaler Inhale 2 puffs into the lungs every 6 (six) hours as needed for wheezing or shortness of breath.      [name] aspirin 81 MG tablet Take 81 mg by mouth every morning.       [name] atorvastatin (LIPITOR) 40 MG tablet Take 40 mg by mouth every morning.       . B Complex-C-Folic Acid (RENA-VITE RX) 1 MG TABS Take 1 tablet by mouth every morning.       . carvedilol (COREG) 12.5 MG tablet Take 1 tablet (12.5 mg total) by mouth 2 (two) times daily.  180 tablet  1  . ciprofloxacin (CIPRO) 500 MG tablet Take 1  tablet (500 mg total) by mouth daily with breakfast.  2 tablet  0  . docusate sodium (COLACE) 100 MG capsule Take 100 mg by mouth 2 (two) times daily.      . furosemide (LASIX) 40 MG tablet Take 1 tablet (40 mg total) by mouth daily.      . hydrALAZINE (APRESOLINE) 10 MG tablet Take 10 mg by mouth 3 (three) times daily.      . insulin lispro (HUMALOG) 100 UNIT/ML injection Inject 5 Units into the skin 2 (two) times daily. Only takes if CBG >250.      [name] isosorbide mononitrate (IMDUR) 30 MG 24 hr tablet Take 1 tablet (30 mg total) by mouth daily.  30 tablet  0  . Melatonin 3 MG TABS Take 3 mg by mouth at bedtime.       [name] DELICA LANCETS 33G MISC       . ONETOUCH VERIO test strip       . paricalcitol (ZEMPLAR) 1 MCG capsule       . sitaGLIPtin (JANUVIA) 25 MG tablet Take 25 mg by mouth daily.       No current facility-administered medications for this visit.    Allergies:   Review of patient's allergies indicates no known allergies.   Social History:  The patient  reports that she has never smoked. She has never used smokeless tobacco. She reports that she does not drink alcohol or use illicit drugs.   Family History:  The patient's family history includes Diabetes in her father; Hyperlipidemia in her daughter and son; Hypertension in her daughter and father.   ROS:  Please see the history of present illness.     All other systems reviewed and negative.   PHYSICAL EXAM: VS:  BP 130/68  Pulse 67  Ht 4\' 9"  (1.448 m)  Wt 182 lb (82.555 kg)  BMI 39.37 kg/m2 Well nourished, well developed, in no acute distress HEENT: normal Neck: no JVDat 90 Cardiac:  normal S1, S2; RRR; no murmur Lungs:  Clear to auscultation bilaterally, no wheezing, rhonchi or rales Abd: soft, nontender, no hepatomegaly Ext:  trace-1+ bilateral LE edema (L>R) Skin: warm and dry Neuro:  CNs 2-12 intact, no focal abnormalities noted  EKG:  NSR, HR 67, 1st degree AVB, LAFB, inferior Q waves,  RBBB   ASSESSMENT AND PLAN:  1. Chronic Combined Systolic and Diastolic CHF:  Volume stable. Continue current therapy. We discussed the importance of daily weights and when to call for weight gain.  I will give her a Rx for the lightest dose possible of LE compression stockings.  Hopefully she  will be able to wear these. 2. CAD:  No angina. Recent non-STEMI likely type II in the setting of acute on chronic combined systolic and diastolic CHF in the setting of chronic kidney disease. Medical therapy has been recommended. Continue ASA, beta blocker, statin. 3. Hypertension:  Controlled.   4. Hyperlipidemia:  Continue statin. 5. Chronic Kidney Disease:  Continue follow up with Dr. Hyman HopesWebb.  6. PAD:  Continue follow up with [facility]. 7. Disposition:  Follow up with Dr. Katrinka BlazingSmith in July.  Signed, Tereso NewcomerScott Harlowe Dowler, PA-C  05/17/2014 12:27 PM

## 2014-05-17 NOTE — Patient Instructions (Signed)
YOU HAVE BEEN GIVEN AN ORDER FOR COMPRESSION [facility]  [facility]  [address] [place], [place] [address] -  Phone: [phone]) [phone]  Web: [email or web address]  NO CHANGES WERE MADE WITH YOUR MEDICATIONS TODAY  MAKE SURE TO KEEP YOUR FOLLOW UP WITH DR. Katrinka BlazingSMITH IN July, 2015

## 2014-06-02 ENCOUNTER — Encounter: Payer: Self-pay | Admitting: Podiatry

## 2014-06-02 ENCOUNTER — Ambulatory Visit (INDEPENDENT_AMBULATORY_CARE_PROVIDER_SITE_OTHER): Payer: 59 | Admitting: Podiatry

## 2014-06-02 VITALS — BP 177/76 | HR 64

## 2014-06-02 DIAGNOSIS — E1149 Type 2 diabetes mellitus with other diabetic neurological complication: Secondary | ICD-10-CM

## 2014-06-02 DIAGNOSIS — M216X9 Other acquired deformities of unspecified foot: Secondary | ICD-10-CM

## 2014-06-02 DIAGNOSIS — M21969 Unspecified acquired deformity of unspecified lower leg: Secondary | ICD-10-CM | POA: Insufficient documentation

## 2014-06-02 DIAGNOSIS — R600 Localized edema: Secondary | ICD-10-CM

## 2014-06-02 DIAGNOSIS — E119 Type 2 diabetes mellitus without complications: Secondary | ICD-10-CM

## 2014-06-02 DIAGNOSIS — R609 Edema, unspecified: Secondary | ICD-10-CM

## 2014-06-02 NOTE — Patient Instructions (Signed)
Both feet measured for diabetic shoes. 

## 2014-06-02 NOTE — Progress Notes (Signed)
Subjective:  80 year old female presents accompanied by her daughter, aided by walker for diabetic shoe preparation.   Objective: Vascular: Pedal pulses are not palpable. Excess foot and leg edema on left. Old surgical incision line on left lower limb has rough crust formation.  Dermatologic: Discolored ingrown nail right lateral border with drainage.  Neurologic: All epicritic sensations grossly intact with hyperalgic foot left.  Orthopedic: Rectus foot without gross deformity during off loading.  Severe STJ pronation with flattening arch upon weight bearing.   Assessment:  Status post vascular reconstruction left lower limb.  Pedal edema bilateral with difficulty wearing regular shoes. STJ hyper pronation with flattening arch upon weight bearn.  Diabetic under control.   Plan:  Both feet measured for Diabetic shoes.

## 2014-06-04 DIAGNOSIS — I5030 Unspecified diastolic (congestive) heart failure: Secondary | ICD-10-CM | POA: Insufficient documentation

## 2014-06-04 DIAGNOSIS — Z9889 Other specified postprocedural states: Secondary | ICD-10-CM | POA: Insufficient documentation

## 2014-06-08 ENCOUNTER — Encounter: Payer: Self-pay | Admitting: Interventional Cardiology

## 2014-06-09 ENCOUNTER — Encounter (HOSPITAL_COMMUNITY)
Admission: RE | Admit: 2014-06-09 | Discharge: 2014-06-09 | Disposition: A | Discharge status: Home / Self Care | Payer: Medicare Other | Source: Ambulatory Visit | Attending: Nephrology | Admitting: Nephrology

## 2014-06-09 DIAGNOSIS — N183 Chronic kidney disease, stage 3 unspecified: Secondary | ICD-10-CM | POA: Insufficient documentation

## 2014-06-09 DIAGNOSIS — D638 Anemia in other chronic diseases classified elsewhere: Secondary | ICD-10-CM | POA: Insufficient documentation

## 2014-06-09 MED ORDER — DARBEPOETIN ALFA-POLYSORBATE 500 MCG/ML IJ SOLN
40.0000 ug | INTRAMUSCULAR | Status: DC
  Filled 2014-06-09: qty 1

## 2014-06-09 MED ORDER — DARBEPOETIN ALFA-POLYSORBATE 40 MCG/0.4ML IJ SOLN
INTRAMUSCULAR | Status: AC
  Administered 2014-06-09: 40 ug via SUBCUTANEOUS
  Filled 2014-06-09: qty 0.4

## 2014-07-06 ENCOUNTER — Ambulatory Visit: Payer: Medicare Other | Admitting: Interventional Cardiology

## 2014-07-06 ENCOUNTER — Other Ambulatory Visit (HOSPITAL_COMMUNITY): Payer: Self-pay | Admitting: *Deleted

## 2014-07-07 ENCOUNTER — Encounter (HOSPITAL_COMMUNITY)
Admission: RE | Admit: 2014-07-07 | Discharge: 2014-07-07 | Disposition: A | Discharge status: Home / Self Care | Payer: Medicare Other | Source: Ambulatory Visit | Attending: Nephrology | Admitting: Nephrology

## 2014-07-07 DIAGNOSIS — D638 Anemia in other chronic diseases classified elsewhere: Secondary | ICD-10-CM | POA: Diagnosis present

## 2014-07-07 DIAGNOSIS — N183 Chronic kidney disease, stage 3 unspecified: Secondary | ICD-10-CM | POA: Diagnosis not present

## 2014-07-07 MED ORDER — DARBEPOETIN ALFA-POLYSORBATE 500 MCG/ML IJ SOLN
40.0000 ug | INTRAMUSCULAR | Status: DC
  Filled 2014-07-07: qty 1

## 2014-07-07 MED ORDER — DARBEPOETIN ALFA-POLYSORBATE 40 MCG/0.4ML IJ SOLN
INTRAMUSCULAR | Status: AC
  Administered 2014-07-07: 40 ug via SUBCUTANEOUS
  Filled 2014-07-07: qty 0.4

## 2014-07-14 DIAGNOSIS — L0390 Cellulitis, unspecified: Secondary | ICD-10-CM | POA: Insufficient documentation

## 2014-07-26 ENCOUNTER — Encounter: Payer: Self-pay | Admitting: Vascular Surgery

## 2014-07-27 ENCOUNTER — Ambulatory Visit (HOSPITAL_COMMUNITY)
Admission: RE | Admit: 2014-07-27 | Discharge: 2014-07-27 | Disposition: A | Discharge status: Home / Self Care | Payer: Medicare Other | Source: Ambulatory Visit | Attending: Vascular Surgery | Admitting: Vascular Surgery

## 2014-07-27 ENCOUNTER — Encounter: Payer: Self-pay | Admitting: Vascular Surgery

## 2014-07-27 ENCOUNTER — Ambulatory Visit (INDEPENDENT_AMBULATORY_CARE_PROVIDER_SITE_OTHER)
Admission: RE | Admit: 2014-07-27 | Discharge: 2014-07-27 | Disposition: A | Discharge status: Home / Self Care | Payer: Medicare Other | Source: Ambulatory Visit | Attending: Vascular Surgery | Admitting: Vascular Surgery

## 2014-07-27 ENCOUNTER — Ambulatory Visit (INDEPENDENT_AMBULATORY_CARE_PROVIDER_SITE_OTHER): Payer: Medicare Other | Admitting: Vascular Surgery

## 2014-07-27 ENCOUNTER — Other Ambulatory Visit: Payer: Self-pay

## 2014-07-27 VITALS — BP 172/60 | HR 54 | Ht <= 58 in | Wt 180.5 lb

## 2014-07-27 DIAGNOSIS — I739 Peripheral vascular disease, unspecified: Secondary | ICD-10-CM

## 2014-07-27 DIAGNOSIS — Z48812 Encounter for surgical aftercare following surgery on the circulatory system: Secondary | ICD-10-CM

## 2014-07-27 NOTE — Progress Notes (Signed)
Patient name: Dorothy Bartlett MRN: [id number] DOB: 06-25-1926 Sex: female     Reason for referral:  Chief Complaint  Patient presents with  . Re-evaluation    3 month f/u     HISTORY OF PRESENT ILLNESS: Patient presents today for evaluation of her right foot. She is well known to me from a prior left femoral to posterior tibial in situ bypass for critical limb ischemia. She had intolerable rest pain and underwent left leg bypass in February 2015. She has severe cardiac disease and also renal insufficiency. She is at extremely high risk of despite this did very well with her femoral-tibial bypass. She presents today now for difficulty in the contralateral leg. She has gangrenous changes of the tip of her right third toe. Sugar daughter reports is been present for approximately 5 weeks and has been slightly progressive. She has severe pain associated with this. She is being seen at [facility]. She does have severe cardiac disease but no recent exacerbations. She does see Dr. Katrinka Blazing later this week  Past Medical History  Diagnosis Date  . MI (myocardial infarction) 1995  . Asthma   . Hx of echocardiogram   . Chronic diastolic CHF (congestive heart failure)     a. Echocardiogram (04/2005):EF 55-65%, moderate LVH, mild aortic stenosis (mean gradient 9), moderate LAE.  [name] Hyperlipidemia   . PAD (peripheral artery disease)   . Carotid stenosis     Carotid US (12/2013): < 60% RCA, < 40% LICA.  [name] Type II diabetes mellitus   . Arthritis   . PVD (peripheral vascular disease)   . CKD (chronic kidney disease), stage IV     notes 02/22/2014; Dr. Hyman Hopes  . Hypertension   . Coronary artery disease     a. s/p MI 1995 tx w/ PTCA to Dx, Ramus, ap LAD;  b. LHC (11/2004):  Inferior HK, EF 60%, proximal LAD 50%, ostial D1 80%, mid D1 95%, mid LAD 50-60%, distal LAD at the apex 90%, OM1 occluded (fills late by left to left collaterals), RCA 75%. Medical therapy recommended.;  c. Lexiscan  Myoview (12/2013):  Prior ant-lat scar with very small area of peri-infarct ischemia in ant wall, EF 40 (high risk)     Past Surgical History  Procedure Laterality Date  . Repair of nerve in left arm  Left 2000  . Cardiac catheterization  1995    w/ PTCA Diag 2nd MI  . Femoral-tibial bypass graft Left 02/24/2014    Procedure: BYPASS GRAFT FEMORAL-TIBIAL ARTERY;  Surgeon: Larina Earthly, MD;  Location[facility];  Service: Vascular;  Laterality: Left;    History   Social History  . Marital Status: Widowed    Spouse Name: N/A    Number of Children: N/A  . Years of Education: N/A   Occupational History  . Retired    Social History Main Topics  . Smoking status: Never Smoker   . Smokeless tobacco: Never Used  . Alcohol Use: No  . Drug Use: No  . Sexual Activity: No   Other Topics Concern  . Not on file   Social History Narrative   Tobacco use cigarettes: Former Smoker   Occupation: Unemployed, retired   Marital Status: Single   Children: 2 daughters    Family History  Problem Relation Age of Onset  . Diabetes Father   . Hypertension Father   . Hypertension Daughter   . Hyperlipidemia Daughter   . Hyperlipidemia Son   .  Hypertension Sister     Allergies as of 07/27/2014  . (No Known Allergies)    Current Outpatient Prescriptions on File Prior to Visit  Medication Sig Dispense Refill  . albuterol (PROVENTIL HFA;VENTOLIN HFA) 108 (90 BASE) MCG/ACT inhaler Inhale 2 puffs into the lungs every 6 (six) hours as needed for wheezing or shortness of breath.      [name] aspirin 81 MG tablet Take 81 mg by mouth every morning.       [name] atorvastatin (LIPITOR) 40 MG tablet Take 40 mg by mouth every morning.       . B Complex-C-Folic Acid (RENA-VITE RX) 1 MG TABS Take 1 tablet by mouth every morning.       . carvedilol (COREG) 12.5 MG tablet Take 1 tablet (12.5 mg total) by mouth 2 (two) times daily.  180 tablet  1  . ciprofloxacin (CIPRO) 500 MG tablet Take 1 tablet (500 mg total) by  mouth daily with breakfast.  2 tablet  0  . docusate sodium (COLACE) 100 MG capsule Take 100 mg by mouth 2 (two) times daily.      . furosemide (LASIX) 40 MG tablet Take 1 tablet (40 mg total) by mouth daily.      . hydrALAZINE (APRESOLINE) 10 MG tablet Take 10 mg by mouth 3 (three) times daily.      . insulin lispro (HUMALOG) 100 UNIT/ML injection Inject 5 Units into the skin 2 (two) times daily. Only takes if CBG >250.      [name] isosorbide mononitrate (IMDUR) 30 MG 24 hr tablet Take 1 tablet (30 mg total) by mouth daily.  30 tablet  0  . Melatonin 3 MG TABS Take 3 mg by mouth at bedtime.       [name] DELICA LANCETS 33G MISC       . ONETOUCH VERIO test strip       . paricalcitol (ZEMPLAR) 1 MCG capsule       . pregabalin (LYRICA) 50 MG capsule Take 50 mg by mouth daily.      . sitaGLIPtin (JANUVIA) 25 MG tablet Take 25 mg by mouth daily.       No current facility-administered medications on file prior to visit.        PHYSICAL EXAMINATION:  General: The patient is a well-nourished female, in no acute distress. Vital signs are BP 172/60  Pulse 54  Ht 4\' 9"  (1.448 m)  Wt 180 lb 8 oz (81.874 kg)  BMI 39.05 kg/m2  SpO2 100% Pulmonary: There is a good air exchange bilaterally Abdomen: Soft and non-tender with obesity Musculoskeletal: There are no major deformities.   Neurologic: No focal weakness or paresthesias are detected, Skin: Dry gangrene of the tip of her right third toe. Moderate lower extremity swelling bilaterally Psychiatric: The patient has normal affect. Cardiovascular: Palpable left femoral posterior tibial in situ graft pulse. No palpable right pedal pulses   [facility] Vascular Lab Studies:  Ordered and Independently Reviewed . Her left posterior tibial bypass is patent. She does have several small branches that are patent to her in situ graft. On the right she has absent flow in her posterior tibial artery dampened monophasic in the right dorsalis pedis  artery  Impression and Plan:  Limb threatening ischemia to right lower extremity. Had a long discussion with the patient and her daughter present. I explained again the risk of renal worsening with contrast study and surgery. Also significant cardiac decompensation is present. I do not see any alternative  other than arteriography and probable bypass. We did limit her contrast in January when she had her former left leg study with CO2 a slight amount of contrast. We will admit her to the hospital on Tuesday, August 4 for arteriography and plan for surgery is indicated on Wednesday, August 5. She's  already scheduled to see Dr. Katrinka BlazingSmith for cardiology evaluation on Thursday    Rashay Barnette [facility] [place] Office: [phone]

## 2014-07-28 ENCOUNTER — Encounter (HOSPITAL_COMMUNITY): Payer: Self-pay | Admitting: Pharmacy Technician

## 2014-07-29 ENCOUNTER — Encounter: Payer: Self-pay | Admitting: Interventional Cardiology

## 2014-07-29 ENCOUNTER — Ambulatory Visit (INDEPENDENT_AMBULATORY_CARE_PROVIDER_SITE_OTHER): Payer: Medicare Other | Admitting: Interventional Cardiology

## 2014-07-29 VITALS — BP 160/86 | HR 62 | Ht <= 58 in | Wt 180.0 lb

## 2014-07-29 DIAGNOSIS — I5042 Chronic combined systolic (congestive) and diastolic (congestive) heart failure: Secondary | ICD-10-CM

## 2014-07-29 DIAGNOSIS — I2510 Atherosclerotic heart disease of native coronary artery without angina pectoris: Secondary | ICD-10-CM

## 2014-07-29 DIAGNOSIS — E785 Hyperlipidemia, unspecified: Secondary | ICD-10-CM

## 2014-07-29 DIAGNOSIS — I10 Essential (primary) hypertension: Secondary | ICD-10-CM

## 2014-07-29 DIAGNOSIS — Z01810 Encounter for preprocedural cardiovascular examination: Secondary | ICD-10-CM

## 2014-07-29 NOTE — Progress Notes (Signed)
Patient ID: Dorothy Bartlett, female   DOB: 1926-12-23, 80 y.o.   MRN: [id number]    [address] [place], [place]  [address] Phone: [phone] Fax:  [phone]  Date:  07/29/2014   ID:  Dorothy Bartlett, DOB [birth year], MRN [id number]  PCP:  Pcp Not In System   ASSESSMENT:  1. Preoperative cardiovascular clearance 2. Chronic combined systolic and diastolic heart failure 3. Moderate to severe CAD, asymptomatic with reference to angina 4. Stage IV chronic kidney disease with volume status stable at this time  PLAN:  1. Cleared for the upcoming right lower extremity revascularization procedure by Dr. Arbie Cookey 2. No change in the current medical regimen 3. Care with reference to volume administration to avoid acute on chronic systolic heart failure 4. Please consult [place] on admission so that we may follow and advise with reference to volume removal/diuresis. The other alternative would be to have the nephrology team follow.   SUBJECTIVE: Dorothy Bartlett is a 80 y.o. female who is here today for clinical followup. She denies angina. No orthopnea, PND, or dyspnea. She has not needed to use nitroglycerin. She denies lower extremity swelling. She now has a right lower extremity ulcer and may need to have a toe amputation as well as revascularization because of severe right lower extremity PAD. Dr. Tawanna Cooler early is planning angiography followed by surgery next week if no contraindications.  She has been compliant with her medical regimen and tells me that the renal function has been stable.   Wt Readings from Last 3 Encounters:  07/29/14 180 lb (81.647 kg)  07/27/14 180 lb 8 oz (81.874 kg)  05/17/14 182 lb (82.555 kg)     Past Medical History  Diagnosis Date  . MI (myocardial infarction) 1995  . Asthma   . Hx of echocardiogram   . Chronic diastolic CHF (congestive heart failure)     a. Echocardiogram (04/2005):EF 55-65%, moderate LVH, mild aortic stenosis (mean gradient 9),  moderate LAE.  [name] Hyperlipidemia   . PAD (peripheral artery disease)   . Carotid stenosis     Carotid US (12/2013): < 60% RCA, < 40% LICA.  [name] Type II diabetes mellitus   . Arthritis   . PVD (peripheral vascular disease)   . CKD (chronic kidney disease), stage IV     notes 02/22/2014; Dr. Hyman Hopes  . Hypertension   . Coronary artery disease     a. s/p MI 1995 tx w/ PTCA to Dx, Ramus, ap LAD;  b. LHC (11/2004):  Inferior HK, EF 60%, proximal LAD 50%, ostial D1 80%, mid D1 95%, mid LAD 50-60%, distal LAD at the apex 90%, OM1 occluded (fills late by left to left collaterals), RCA 75%. Medical therapy recommended.;  c. Lexiscan Myoview (12/2013):  Prior ant-lat scar with very small area of peri-infarct ischemia in ant wall, EF 40 (high risk)     Current Outpatient Prescriptions  Medication Sig Dispense Refill  . albuterol (PROVENTIL HFA;VENTOLIN HFA) 108 (90 BASE) MCG/ACT inhaler Inhale 2 puffs into the lungs every 6 (six) hours as needed for wheezing or shortness of breath.      [name] aspirin 81 MG tablet Take 81 mg by mouth every morning.       [name] atorvastatin (LIPITOR) 40 MG tablet Take 40 mg by mouth every morning.       . B Complex-C-Folic Acid (RENA-VITE RX) 1 MG TABS Take 1 tablet by mouth every morning.       [name]  carvedilol (COREG) 12.5 MG tablet Take 1 tablet (12.5 mg total) by mouth 2 (two) times daily.  180 tablet  1  . docusate sodium (COLACE) 100 MG capsule Take 100 mg by mouth at bedtime.       [name]. doxycycline (DORYX) 100 MG DR capsule Take 100 mg by mouth 2 (two) times daily. Starting 7/17 for 10 days      . furosemide (LASIX) 40 MG tablet Take 1 tablet (40 mg total) by mouth daily.      . hydrALAZINE (APRESOLINE) 10 MG tablet Take 10 mg by mouth 3 (three) times daily.      . insulin lispro (HUMALOG) 100 UNIT/ML injection Inject 5 Units into the skin 2 (two) times daily as needed for high blood sugar. Only takes if CBG >250.      [name]. isosorbide mononitrate (IMDUR) 30 MG 24 hr tablet Take 1 tablet  (30 mg total) by mouth daily.  30 tablet  0  . Melatonin 3 MG TABS Take 3 mg by mouth at bedtime as needed (for sleep).       Dorothy Bartlett. ONETOUCH DELICA LANCETS 33G MISC       . ONETOUCH VERIO test strip       . paricalcitol (ZEMPLAR) 1 MCG capsule Take 1 mcg by mouth daily.       . pregabalin (LYRICA) 50 MG capsule Take 50 mg by mouth daily.      . sitaGLIPtin (JANUVIA) 25 MG tablet Take 25 mg by mouth daily.       No current facility-administered medications for this visit.    Allergies:   No Known Allergies  Social History:  The patient  reports that she has never smoked. She has never used smokeless tobacco. She reports that she does not drink alcohol or use illicit drugs.   ROS:  Please see the history of present illness.   No blood in her urine or stool. Denies peripheral edema. Trauma to her right great toe lead to the current nonhealing situation. No palpitations. No syncope.   All other systems reviewed and negative.   OBJECTIVE: VS:  BP 160/86  Pulse 62  Ht 4\' 9"  (1.448 m)  Wt 180 lb (81.647 kg)  BMI 38.94 kg/m2 Well nourished, well developed, in no acute distress, elderly but younger than stated age in appearance HEENT: normal Neck: JVD flat. Carotid bruit absent  Cardiac:  normal S1, S2; RRR; no murmur Lungs:  clear to auscultation bilaterally, no wheezing, rhonchi or rales Abd: soft, nontender, no hepatomegaly Ext: Edema absent. Pulses difficult to palpate pulses below the femoral Skin: warm and dry Neuro:  CNs 2-12 intact, no focal abnormalities noted  EKG:  Not performed       Signed, Darci NeedleHenry W. B. Smith III, MD 07/29/2014 10:26 AM

## 2014-07-29 NOTE — Patient Instructions (Signed)
Your physician recommends that you continue on your current medications as directed. Please refer to the Current Medication list given to you today.  You have been cleared for your surgery with Dr.Early  Your physician wants you to follow-up in: 6 months with Dr.Smith.You will receive a reminder letter in the mail two months in advance. If you don't receive a letter, please call our office to schedule the follow-up appointment.

## 2014-08-03 ENCOUNTER — Encounter (HOSPITAL_COMMUNITY): Payer: Self-pay | Admitting: *Deleted

## 2014-08-03 ENCOUNTER — Inpatient Hospital Stay (HOSPITAL_COMMUNITY)
Admission: RE | Admit: 2014-08-03 | Discharge: 2014-08-10 | DRG: 253 | Disposition: A | Discharge status: Skilled Nursing Facility | Payer: Medicare Other | Source: Ambulatory Visit | Attending: Vascular Surgery | Admitting: Vascular Surgery

## 2014-08-03 ENCOUNTER — Inpatient Hospital Stay (HOSPITAL_COMMUNITY): Payer: Medicare Other

## 2014-08-03 ENCOUNTER — Encounter (HOSPITAL_COMMUNITY)
Admission: RE | Disposition: A | Discharge status: Skilled Nursing Facility | Payer: Self-pay | Source: Ambulatory Visit | Attending: Vascular Surgery

## 2014-08-03 DIAGNOSIS — Z833 Family history of diabetes mellitus: Secondary | ICD-10-CM | POA: Diagnosis not present

## 2014-08-03 DIAGNOSIS — I70269 Atherosclerosis of native arteries of extremities with gangrene, unspecified extremity: Secondary | ICD-10-CM | POA: Diagnosis present

## 2014-08-03 DIAGNOSIS — I7092 Chronic total occlusion of artery of the extremities: Secondary | ICD-10-CM | POA: Diagnosis present

## 2014-08-03 DIAGNOSIS — I129 Hypertensive chronic kidney disease with stage 1 through stage 4 chronic kidney disease, or unspecified chronic kidney disease: Secondary | ICD-10-CM | POA: Diagnosis present

## 2014-08-03 DIAGNOSIS — I509 Heart failure, unspecified: Secondary | ICD-10-CM | POA: Diagnosis present

## 2014-08-03 DIAGNOSIS — L97509 Non-pressure chronic ulcer of other part of unspecified foot with unspecified severity: Secondary | ICD-10-CM | POA: Diagnosis present

## 2014-08-03 DIAGNOSIS — E13621 Other specified diabetes mellitus with foot ulcer: Secondary | ICD-10-CM | POA: Diagnosis present

## 2014-08-03 DIAGNOSIS — E1159 Type 2 diabetes mellitus with other circulatory complications: Principal | ICD-10-CM | POA: Diagnosis present

## 2014-08-03 DIAGNOSIS — Z6839 Body mass index (BMI) 39.0-39.9, adult: Secondary | ICD-10-CM

## 2014-08-03 DIAGNOSIS — Z9861 Coronary angioplasty status: Secondary | ICD-10-CM

## 2014-08-03 DIAGNOSIS — D62 Acute posthemorrhagic anemia: Secondary | ICD-10-CM | POA: Diagnosis not present

## 2014-08-03 DIAGNOSIS — I2510 Atherosclerotic heart disease of native coronary artery without angina pectoris: Secondary | ICD-10-CM | POA: Diagnosis present

## 2014-08-03 DIAGNOSIS — Z8249 Family history of ischemic heart disease and other diseases of the circulatory system: Secondary | ICD-10-CM | POA: Diagnosis not present

## 2014-08-03 DIAGNOSIS — Z79899 Other long term (current) drug therapy: Secondary | ICD-10-CM | POA: Diagnosis not present

## 2014-08-03 DIAGNOSIS — I739 Peripheral vascular disease, unspecified: Secondary | ICD-10-CM | POA: Diagnosis present

## 2014-08-03 DIAGNOSIS — I252 Old myocardial infarction: Secondary | ICD-10-CM

## 2014-08-03 DIAGNOSIS — N184 Chronic kidney disease, stage 4 (severe): Secondary | ICD-10-CM | POA: Diagnosis present

## 2014-08-03 DIAGNOSIS — E669 Obesity, unspecified: Secondary | ICD-10-CM | POA: Diagnosis present

## 2014-08-03 DIAGNOSIS — Z794 Long term (current) use of insulin: Secondary | ICD-10-CM | POA: Diagnosis not present

## 2014-08-03 DIAGNOSIS — Z87891 Personal history of nicotine dependence: Secondary | ICD-10-CM

## 2014-08-03 DIAGNOSIS — J45909 Unspecified asthma, uncomplicated: Secondary | ICD-10-CM | POA: Diagnosis present

## 2014-08-03 DIAGNOSIS — E785 Hyperlipidemia, unspecified: Secondary | ICD-10-CM | POA: Diagnosis present

## 2014-08-03 DIAGNOSIS — Z7982 Long term (current) use of aspirin: Secondary | ICD-10-CM

## 2014-08-03 DIAGNOSIS — I5042 Chronic combined systolic (congestive) and diastolic (congestive) heart failure: Secondary | ICD-10-CM | POA: Diagnosis present

## 2014-08-03 HISTORY — PX: LOWER EXTREMITY ANGIOGRAM: SHX5508

## 2014-08-03 LAB — GLUCOSE, CAPILLARY
GLUCOSE-CAPILLARY: 116 mg/dL — AB (ref 70–99)
Glucose-Capillary: 157 mg/dL — ABNORMAL HIGH (ref 70–99)
Glucose-Capillary: 170 mg/dL — ABNORMAL HIGH (ref 70–99)

## 2014-08-03 LAB — POCT I-STAT, CHEM 8
BUN: 120 mg/dL — AB (ref 6–23)
CHLORIDE: 104 meq/L (ref 96–112)
CREATININE: 3.7 mg/dL — AB (ref 0.50–1.10)
Calcium, Ion: 1.15 mmol/L (ref 1.13–1.30)
Glucose, Bld: 99 mg/dL (ref 70–99)
HCT: 33 % — ABNORMAL LOW (ref 36.0–46.0)
Hemoglobin: 11.2 g/dL — ABNORMAL LOW (ref 12.0–15.0)
Potassium: 4.6 mEq/L (ref 3.7–5.3)
SODIUM: 140 meq/L (ref 137–147)
TCO2: 26 mmol/L (ref 0–100)

## 2014-08-03 SURGERY — ANGIOGRAM, LOWER EXTREMITY
Anesthesia: LOCAL | Laterality: Right

## 2014-08-03 MED ORDER — DOCUSATE SODIUM 100 MG PO CAPS
100.0000 mg | ORAL_CAPSULE | Freq: Every day | ORAL | Status: DC
  Administered 2014-08-03 – 2014-08-09 (×7): 100 mg via ORAL
  Filled 2014-08-03 (×9): qty 1

## 2014-08-03 MED ORDER — DOXYCYCLINE HYCLATE 100 MG PO TABS
100.0000 mg | ORAL_TABLET | Freq: Two times a day (BID) | ORAL | Status: DC
  Administered 2014-08-03 – 2014-08-10 (×13): 100 mg via ORAL
  Filled 2014-08-03 (×18): qty 1

## 2014-08-03 MED ORDER — HYDRALAZINE HCL 10 MG PO TABS
10.0000 mg | ORAL_TABLET | Freq: Three times a day (TID) | ORAL | Status: DC
  Administered 2014-08-03 – 2014-08-10 (×17): 10 mg via ORAL
  Filled 2014-08-03 (×24): qty 1

## 2014-08-03 MED ORDER — OXYCODONE HCL 5 MG PO TABS: 5.0000 mg | ORAL_TABLET | ORAL | Status: DC | PRN

## 2014-08-03 MED ORDER — SODIUM CHLORIDE 0.9 % IV SOLN: INTRAVENOUS | Status: AC

## 2014-08-03 MED ORDER — ONDANSETRON HCL 4 MG/2ML IJ SOLN: 4.0000 mg | Freq: Four times a day (QID) | INTRAMUSCULAR | Status: DC | PRN

## 2014-08-03 MED ORDER — CARVEDILOL 12.5 MG PO TABS
12.5000 mg | ORAL_TABLET | Freq: Two times a day (BID) | ORAL | Status: DC
  Administered 2014-08-03 – 2014-08-10 (×12): 12.5 mg via ORAL
  Filled 2014-08-03 (×19): qty 1

## 2014-08-03 MED ORDER — PHENOL 1.4 % MT LIQD
1.0000 | OROMUCOSAL | Status: DC | PRN
  Filled 2014-08-03: qty 177

## 2014-08-03 MED ORDER — ALUM & MAG HYDROXIDE-SIMETH 200-200-20 MG/5ML PO SUSP: 15.0000 mL | ORAL | Status: DC | PRN

## 2014-08-03 MED ORDER — SODIUM CHLORIDE 0.9 % IV SOLN
INTRAVENOUS | Status: DC
  Administered 2014-08-03 – 2014-08-04 (×3): via INTRAVENOUS

## 2014-08-03 MED ORDER — METOPROLOL TARTRATE 1 MG/ML IV SOLN: 2.0000 mg | INTRAVENOUS | Status: DC | PRN

## 2014-08-03 MED ORDER — INSULIN LISPRO 100 UNIT/ML ~~LOC~~ SOLN: 5.0000 [IU] | Freq: Two times a day (BID) | SUBCUTANEOUS | Status: DC | PRN

## 2014-08-03 MED ORDER — HEPARIN (PORCINE) IN NACL 2-0.9 UNIT/ML-% IJ SOLN
INTRAMUSCULAR | Status: AC
  Filled 2014-08-03: qty 1000

## 2014-08-03 MED ORDER — HYDRALAZINE HCL 20 MG/ML IJ SOLN: 10.0000 mg | INTRAMUSCULAR | Status: DC | PRN

## 2014-08-03 MED ORDER — PARICALCITOL 1 MCG PO CAPS
1.0000 ug | ORAL_CAPSULE | Freq: Every day | ORAL | Status: DC
  Administered 2014-08-03 – 2014-08-10 (×7): 1 ug via ORAL
  Filled 2014-08-03 (×8): qty 1

## 2014-08-03 MED ORDER — LABETALOL HCL 5 MG/ML IV SOLN
10.0000 mg | INTRAVENOUS | Status: DC | PRN
  Filled 2014-08-03: qty 4

## 2014-08-03 MED ORDER — FUROSEMIDE 40 MG PO TABS
40.0000 mg | ORAL_TABLET | Freq: Every day | ORAL | Status: DC
  Administered 2014-08-03 – 2014-08-10 (×7): 40 mg via ORAL
  Filled 2014-08-03 (×8): qty 1

## 2014-08-03 MED ORDER — LIDOCAINE HCL (PF) 1 % IJ SOLN
INTRAMUSCULAR | Status: AC
  Filled 2014-08-03: qty 30

## 2014-08-03 MED ORDER — MORPHINE SULFATE 2 MG/ML IJ SOLN: 1.0000 mg | INTRAMUSCULAR | Status: DC | PRN

## 2014-08-03 MED ORDER — ACETAMINOPHEN 325 MG PO TABS: 325.0000 mg | ORAL_TABLET | ORAL | Status: DC | PRN

## 2014-08-03 MED ORDER — ACETAMINOPHEN 325 MG PO TABS
325.0000 mg | ORAL_TABLET | ORAL | Status: DC | PRN
  Administered 2014-08-05: 650 mg via ORAL
  Filled 2014-08-03: qty 2

## 2014-08-03 MED ORDER — DOXYCYCLINE HYCLATE 100 MG PO CPEP: 100.0000 mg | ORAL_CAPSULE | Freq: Two times a day (BID) | ORAL | Status: DC

## 2014-08-03 MED ORDER — PANTOPRAZOLE SODIUM 40 MG PO TBEC
40.0000 mg | DELAYED_RELEASE_TABLET | Freq: Every day | ORAL | Status: DC
  Administered 2014-08-03: 40 mg via ORAL
  Filled 2014-08-03: qty 1

## 2014-08-03 MED ORDER — SODIUM CHLORIDE 0.9 % IV SOLN
INTRAVENOUS | Status: DC
  Administered 2014-08-03: 07:00:00 via INTRAVENOUS

## 2014-08-03 MED ORDER — MELATONIN 3 MG PO TABS: 3.0000 mg | ORAL_TABLET | Freq: Every evening | ORAL | Status: DC | PRN

## 2014-08-03 MED ORDER — RENA-VITE PO TABS
1.0000 | ORAL_TABLET | Freq: Every day | ORAL | Status: DC
  Administered 2014-08-03 – 2014-08-09 (×7): 1 via ORAL
  Filled 2014-08-03 (×9): qty 1

## 2014-08-03 MED ORDER — ISOSORBIDE MONONITRATE ER 30 MG PO TB24
30.0000 mg | ORAL_TABLET | Freq: Every day | ORAL | Status: DC
  Administered 2014-08-05 – 2014-08-10 (×6): 30 mg via ORAL
  Filled 2014-08-03 (×7): qty 1

## 2014-08-03 MED ORDER — ALBUTEROL SULFATE HFA 108 (90 BASE) MCG/ACT IN AERS: 2.0000 | INHALATION_SPRAY | Freq: Four times a day (QID) | RESPIRATORY_TRACT | Status: DC | PRN

## 2014-08-03 MED ORDER — ASPIRIN EC 81 MG PO TBEC
81.0000 mg | DELAYED_RELEASE_TABLET | Freq: Every day | ORAL | Status: DC
  Administered 2014-08-05 – 2014-08-10 (×6): 81 mg via ORAL
  Filled 2014-08-03 (×7): qty 1

## 2014-08-03 MED ORDER — ACETAMINOPHEN 650 MG RE SUPP: 325.0000 mg | RECTAL | Status: DC | PRN

## 2014-08-03 MED ORDER — HYDRALAZINE HCL 20 MG/ML IJ SOLN
10.0000 mg | INTRAMUSCULAR | Status: DC | PRN
  Administered 2014-08-04: 10 mg via INTRAVENOUS

## 2014-08-03 MED ORDER — GUAIFENESIN-DM 100-10 MG/5ML PO SYRP: 15.0000 mL | ORAL_SOLUTION | ORAL | Status: DC | PRN

## 2014-08-03 MED ORDER — RENA-VITE RX 1 MG PO TABS: 1.0000 | ORAL_TABLET | Freq: Every morning | ORAL | Status: DC

## 2014-08-03 MED ORDER — ASPIRIN 81 MG PO TABS: 81.0000 mg | ORAL_TABLET | Freq: Every morning | ORAL | Status: DC

## 2014-08-03 MED ORDER — SODIUM CHLORIDE 0.9 % IV SOLN: INTRAVENOUS | Status: DC

## 2014-08-03 MED ORDER — ALBUTEROL SULFATE (2.5 MG/3ML) 0.083% IN NEBU: 2.5000 mg | INHALATION_SOLUTION | Freq: Four times a day (QID) | RESPIRATORY_TRACT | Status: DC | PRN

## 2014-08-03 MED ORDER — ATORVASTATIN CALCIUM 40 MG PO TABS
40.0000 mg | ORAL_TABLET | Freq: Every day | ORAL | Status: DC
  Administered 2014-08-03 – 2014-08-10 (×7): 40 mg via ORAL
  Filled 2014-08-03 (×8): qty 1

## 2014-08-03 MED ORDER — OXYCODONE HCL 5 MG PO TABS
5.0000 mg | ORAL_TABLET | ORAL | Status: DC | PRN
  Administered 2014-08-03 – 2014-08-06 (×2): 5 mg via ORAL
  Filled 2014-08-03 (×2): qty 1

## 2014-08-03 MED ORDER — PREGABALIN 50 MG PO CAPS
50.0000 mg | ORAL_CAPSULE | Freq: Every day | ORAL | Status: DC
  Administered 2014-08-03 – 2014-08-10 (×7): 50 mg via ORAL
  Filled 2014-08-03 (×7): qty 1

## 2014-08-03 MED ORDER — CEFAZOLIN SODIUM 1-5 GM-% IV SOLN: 1.0000 g | INTRAVENOUS | Status: DC

## 2014-08-03 MED ORDER — MORPHINE SULFATE 2 MG/ML IJ SOLN
2.0000 mg | INTRAMUSCULAR | Status: DC | PRN
  Administered 2014-08-05: 2 mg via INTRAVENOUS
  Filled 2014-08-03: qty 1

## 2014-08-03 MED ORDER — CEFAZOLIN SODIUM-DEXTROSE 2-3 GM-% IV SOLR
2.0000 g | INTRAVENOUS | Status: AC
  Administered 2014-08-04: 2 g via INTRAVENOUS

## 2014-08-03 MED ORDER — INSULIN ASPART 100 UNIT/ML ~~LOC~~ SOLN: 5.0000 [IU] | Freq: Two times a day (BID) | SUBCUTANEOUS | Status: DC | PRN

## 2014-08-03 NOTE — Interval H&P Note (Signed)
History and Physical Interval Note:  08/03/2014 8:59 AM  Dorothy Bartlett  has presented today for surgery, with the diagnosis of pvd  The various methods of treatment have been discussed with the patient and family. After consideration of risks, benefits and other options for treatment, the patient has consented to  Procedure(s): LOWER EXTREMITY ANGIOGRAM (Right) as a surgical intervention .  The patient's history has been reviewed, patient examined, no change in status, stable for surgery.  I have reviewed the patient's chart and labs.  Questions were answered to the patient's satisfaction.     BRABHAM IV, V. WELLS

## 2014-08-03 NOTE — Progress Notes (Signed)
Pt retained in the HA for 15 mins until transported.  Pt transported to 2W-10.  Report given to [employer] RN.  Pt to floor via bed.  Pt denies any discomfort at this time.  Daughter at beiside.

## 2014-08-03 NOTE — H&P (View-Only) (Signed)
Patient name: Dorothy Bartlett MRN: [id number] DOB: 06-25-1926 Sex: female     Reason for referral:  Chief Complaint  Patient presents with  . Re-evaluation    3 month f/u     HISTORY OF PRESENT ILLNESS: Patient presents today for evaluation of her right foot. She is well known to me from a prior left femoral to posterior tibial in situ bypass for critical limb ischemia. She had intolerable rest pain and underwent left leg bypass in February 2015. She has severe cardiac disease and also renal insufficiency. She is at extremely high risk of despite this did very well with her femoral-tibial bypass. She presents today now for difficulty in the contralateral leg. She has gangrenous changes of the tip of her right third toe. Sugar daughter reports is been present for approximately 5 weeks and has been slightly progressive. She has severe pain associated with this. She is being seen at the [facility]. She does have severe cardiac disease but no recent exacerbations. She does see Dr. Katrinka Blazing later this week  Past Medical History  Diagnosis Date  . MI (myocardial infarction) 1995  . Asthma   . Hx of echocardiogram   . Chronic diastolic CHF (congestive heart failure)     a. Echocardiogram (04/2005):EF 55-65%, moderate LVH, mild aortic stenosis (mean gradient 9), moderate LAE.  [name] Hyperlipidemia   . PAD (peripheral artery disease)   . Carotid stenosis     Carotid US (12/2013): < 60% RCA, < 40% LICA.  [name] Type II diabetes mellitus   . Arthritis   . PVD (peripheral vascular disease)   . CKD (chronic kidney disease), stage IV     notes 02/22/2014; Dr. Hyman Hopes  . Hypertension   . Coronary artery disease     a. s/p MI 1995 tx w/ PTCA to Dx, Ramus, ap LAD;  b. LHC (11/2004):  Inferior HK, EF 60%, proximal LAD 50%, ostial D1 80%, mid D1 95%, mid LAD 50-60%, distal LAD at the apex 90%, OM1 occluded (fills late by left to left collaterals), RCA 75%. Medical therapy recommended.;  c. Lexiscan  Myoview (12/2013):  Prior ant-lat scar with very small area of peri-infarct ischemia in ant wall, EF 40 (high risk)     Past Surgical History  Procedure Laterality Date  . Repair of nerve in left arm  Left 2000  . Cardiac catheterization  1995    w/ PTCA Diag 2nd MI  . Femoral-tibial bypass graft Left 02/24/2014    Procedure: BYPASS GRAFT FEMORAL-TIBIAL ARTERY;  Surgeon: Larina Earthly, MD;  Location[facility];  Service: Vascular;  Laterality: Left;    History   Social History  . Marital Status: Widowed    Spouse Name: N/A    Number of Children: N/A  . Years of Education: N/A   Occupational History  . Retired    Social History Main Topics  . Smoking status: Never Smoker   . Smokeless tobacco: Never Used  . Alcohol Use: No  . Drug Use: No  . Sexual Activity: No   Other Topics Concern  . Not on file   Social History Narrative   Tobacco use cigarettes: Former Smoker   Occupation: Unemployed, retired   Marital Status: Single   Children: 2 daughters    Family History  Problem Relation Age of Onset  . Diabetes Father   . Hypertension Father   . Hypertension Daughter   . Hyperlipidemia Daughter   . Hyperlipidemia Son   .  Hypertension Sister     Allergies as of 07/27/2014  . (No Known Allergies)    Current Outpatient Prescriptions on File Prior to Visit  Medication Sig Dispense Refill  . albuterol (PROVENTIL HFA;VENTOLIN HFA) 108 (90 BASE) MCG/ACT inhaler Inhale 2 puffs into the lungs every 6 (six) hours as needed for wheezing or shortness of breath.      [name] aspirin 81 MG tablet Take 81 mg by mouth every morning.       [name] atorvastatin (LIPITOR) 40 MG tablet Take 40 mg by mouth every morning.       . B Complex-C-Folic Acid (RENA-VITE RX) 1 MG TABS Take 1 tablet by mouth every morning.       . carvedilol (COREG) 12.5 MG tablet Take 1 tablet (12.5 mg total) by mouth 2 (two) times daily.  180 tablet  1  . ciprofloxacin (CIPRO) 500 MG tablet Take 1 tablet (500 mg total) by  mouth daily with breakfast.  2 tablet  0  . docusate sodium (COLACE) 100 MG capsule Take 100 mg by mouth 2 (two) times daily.      . furosemide (LASIX) 40 MG tablet Take 1 tablet (40 mg total) by mouth daily.      . hydrALAZINE (APRESOLINE) 10 MG tablet Take 10 mg by mouth 3 (three) times daily.      . insulin lispro (HUMALOG) 100 UNIT/ML injection Inject 5 Units into the skin 2 (two) times daily. Only takes if CBG >250.      [name] isosorbide mononitrate (IMDUR) 30 MG 24 hr tablet Take 1 tablet (30 mg total) by mouth daily.  30 tablet  0  . Melatonin 3 MG TABS Take 3 mg by mouth at bedtime.       [name] DELICA LANCETS 33G MISC       . ONETOUCH VERIO test strip       . paricalcitol (ZEMPLAR) 1 MCG capsule       . pregabalin (LYRICA) 50 MG capsule Take 50 mg by mouth daily.      . sitaGLIPtin (JANUVIA) 25 MG tablet Take 25 mg by mouth daily.       No current facility-administered medications on file prior to visit.        PHYSICAL EXAMINATION:  General: The patient is a well-nourished female, in no acute distress. Vital signs are BP 172/60  Pulse 54  Ht 4\' 9"  (1.448 m)  Wt 180 lb 8 oz (81.874 kg)  BMI 39.05 kg/m2  SpO2 100% Pulmonary: There is a good air exchange bilaterally Abdomen: Soft and non-tender with obesity Musculoskeletal: There are no major deformities.   Neurologic: No focal weakness or paresthesias are detected, Skin: Dry gangrene of the tip of her right third toe. Moderate lower extremity swelling bilaterally Psychiatric: The patient has normal affect. Cardiovascular: Palpable left femoral posterior tibial in situ graft pulse. No palpable right pedal pulses   [facility] Vascular Lab Studies:  Ordered and Independently Reviewed . Her left posterior tibial bypass is patent. She does have several small branches that are patent to her in situ graft. On the right she has absent flow in her posterior tibial artery dampened monophasic in the right dorsalis pedis  artery  Impression and Plan:  Limb threatening ischemia to right lower extremity. Had a long discussion with the patient and her daughter present. I explained again the risk of renal worsening with contrast study and surgery. Also significant cardiac decompensation is present. I do not see any alternative  other than arteriography and probable bypass. We did limit her contrast in January when she had her former left leg study with CO2 a slight amount of contrast. We will admit her to the hospital on Tuesday, August 4 for arteriography and plan for surgery is indicated on Wednesday, August 5. She's  already scheduled to see Dr. Katrinka BlazingSmith for cardiology evaluation on Thursday    Kynzlee Hucker [facility] [place] Office: [phone]

## 2014-08-03 NOTE — Op Note (Signed)
    Patient name: Dorothy FraiseCorine H Grewell MRN: [id number] DOB: 1926-08-08 Sex: female  08/03/2014 Pre-operative Diagnosis: PVD with ulcer Post-operative diagnosis:  Same Surgeon:  Jorge NyBRABHAM IV, V. WELLS Procedure Performed:  1.  ultrasound-guided access, right femoral artery  2.  abdominal aortogram with CO2  3.  right lower extremity runoff   Indications:  The patient has a history of left tibial bypass graft which she tolerated well earlier this year for limb threatening ischemia.  She presents with similar complaints on the right leg.  Her creatinine is 3.7.  This study will be done with limited contrast, in anticipation of surgery tomorrow for revascularization.  Procedure:  The patient was identified in the holding area and taken to room 8.  The patient was then placed supine on the table and prepped and draped in the usual sterile fashion.  A time out was called.  Ultrasound was used to evaluate the right common femoral artery.  It was patent .  A digital ultrasound image was acquired.  A micropuncture needle was used to access the right common femoral artery under ultrasound guidance.  An 018 wire was advanced without resistance and a micropuncture sheath was placed.  The 018 wire was removed and a benson wire was placed.  The micropuncture sheath was exchanged for a 5 french sheath.  An omniflush catheter was advanced over the wire to the level of L-1.  An abdominal angiogram was obtained with CO2.  Next retrograde imaging from the femoral sheath were performed with a combination of CO2 a contrast to evaluate the right leg.  A total of 47 cc of contrast were utilized.  Findings:   Aortogram:  No significant renal artery stenosis is identified.  The infrarenal abdominal aorta is widely patent.  Bilateral common and external iliac arteries are widely patent.  Right Lower Extremity:  The right common femoral artery is patent throughout it's course.  The profunda femoral artery is patent throughout it's  course.  The superficial femoral artery is patent down to the adductor canal where it becomes diffusely diseased.  The popliteal artery is occluded.  There is reconstitution of the peroneal artery and the upper leg.  There is distal reconstitution of the anterior tibial artery.   Intervention:  None  Impression:  #1  distal superficial femoral artery disease with popliteal occlusion on the right.  There is reconstitution of the peroneal artery proximally and the anterior tibial artery distally.  #2  a total of 47 mL of contrast was    V. Durene CalWells Nichollas Perusse, M.D. [facility] [place] Office: [phone] Pager:  [phone]

## 2014-08-03 NOTE — Progress Notes (Signed)
PHARMACIST - PHYSICIAN ORDER COMMUNICATION  CONCERNING: P&T Medication Policy on Herbal Medications  DESCRIPTION:  This patient's order for:  melatonin  has been noted.  This product(s) is classified as an "herbal" or natural product. Due to a lack of definitive safety studies or FDA approval, nonstandard manufacturing practices, plus the potential risk of unknown drug-drug interactions while on inpatient medications, the Pharmacy and Therapeutics Committee does not permit the use of "herbal" or natural products of this type within [facility].   ACTION TAKEN: The pharmacy department is unable to verify this order at this time and your patient has been informed of this safety policy. Please reevaluate patient's clinical condition at discharge and address if the herbal or natural product(s) should be resumed at that time.   Dorna LeitzAnh Dillon Mcreynolds, PharmD, BCPS

## 2014-08-04 ENCOUNTER — Inpatient Hospital Stay (HOSPITAL_COMMUNITY): Payer: Medicare Other

## 2014-08-04 ENCOUNTER — Encounter (HOSPITAL_COMMUNITY): Payer: Medicare Other | Admitting: Certified Registered Nurse Anesthetist

## 2014-08-04 ENCOUNTER — Encounter (HOSPITAL_COMMUNITY): Payer: Medicare Other

## 2014-08-04 ENCOUNTER — Encounter (HOSPITAL_COMMUNITY)
Admission: RE | Disposition: A | Discharge status: Skilled Nursing Facility | Payer: Self-pay | Source: Ambulatory Visit | Attending: Vascular Surgery

## 2014-08-04 ENCOUNTER — Inpatient Hospital Stay (HOSPITAL_COMMUNITY): Payer: Medicare Other | Admitting: Certified Registered Nurse Anesthetist

## 2014-08-04 ENCOUNTER — Encounter (HOSPITAL_COMMUNITY): Payer: Self-pay | Admitting: Certified Registered Nurse Anesthetist

## 2014-08-04 DIAGNOSIS — I70269 Atherosclerosis of native arteries of extremities with gangrene, unspecified extremity: Secondary | ICD-10-CM

## 2014-08-04 HISTORY — PX: AMPUTATION: SHX166

## 2014-08-04 HISTORY — PX: FEMORAL-POPLITEAL BYPASS GRAFT: SHX937

## 2014-08-04 LAB — CBC
HCT: 31 % — ABNORMAL LOW (ref 36.0–46.0)
Hemoglobin: 9.8 g/dL — ABNORMAL LOW (ref 12.0–15.0)
MCH: 29.1 pg (ref 26.0–34.0)
MCHC: 31.6 g/dL (ref 30.0–36.0)
MCV: 92 fL (ref 78.0–100.0)
Platelets: 136 10*3/uL — ABNORMAL LOW (ref 150–400)
RBC: 3.37 MIL/uL — AB (ref 3.87–5.11)
RDW: 14.9 % (ref 11.5–15.5)
WBC: 6.2 10*3/uL (ref 4.0–10.5)

## 2014-08-04 LAB — URINALYSIS, ROUTINE W REFLEX MICROSCOPIC
Bilirubin Urine: NEGATIVE
GLUCOSE, UA: NEGATIVE mg/dL
Hgb urine dipstick: NEGATIVE
Ketones, ur: NEGATIVE mg/dL
LEUKOCYTES UA: NEGATIVE
NITRITE: NEGATIVE
PH: 5 (ref 5.0–8.0)
Protein, ur: NEGATIVE mg/dL
SPECIFIC GRAVITY, URINE: 1.019 (ref 1.005–1.030)
Urobilinogen, UA: 0.2 mg/dL (ref 0.0–1.0)

## 2014-08-04 LAB — COMPREHENSIVE METABOLIC PANEL
ALT: 13 U/L (ref 0–35)
ANION GAP: 15 (ref 5–15)
AST: 17 U/L (ref 0–37)
Albumin: 3 g/dL — ABNORMAL LOW (ref 3.5–5.2)
Alkaline Phosphatase: 64 U/L (ref 39–117)
BILIRUBIN TOTAL: 0.3 mg/dL (ref 0.3–1.2)
BUN: 98 mg/dL — AB (ref 6–23)
CHLORIDE: 103 meq/L (ref 96–112)
CO2: 24 meq/L (ref 19–32)
Calcium: 8.9 mg/dL (ref 8.4–10.5)
Creatinine, Ser: 3.53 mg/dL — ABNORMAL HIGH (ref 0.50–1.10)
GFR, EST AFRICAN AMERICAN: 12 mL/min — AB (ref >90)
GFR, EST NON AFRICAN AMERICAN: 11 mL/min — AB (ref >90)
Glucose, Bld: 130 mg/dL — ABNORMAL HIGH (ref 70–99)
Potassium: 4.1 mEq/L (ref 3.7–5.3)
Sodium: 142 mEq/L (ref 137–147)
Total Protein: 6.4 g/dL (ref 6.0–8.3)

## 2014-08-04 LAB — GLUCOSE, CAPILLARY
GLUCOSE-CAPILLARY: 105 mg/dL — AB (ref 70–99)
Glucose-Capillary: 84 mg/dL (ref 70–99)
Glucose-Capillary: 128 mg/dL — ABNORMAL HIGH (ref 70–99)

## 2014-08-04 LAB — SURGICAL PCR SCREEN
MRSA, PCR: NEGATIVE
Staphylococcus aureus: NEGATIVE

## 2014-08-04 SURGERY — BYPASS GRAFT FEMORAL-POPLITEAL ARTERY
Anesthesia: General | Site: Toe | Laterality: Right

## 2014-08-04 MED ORDER — FENTANYL CITRATE 0.05 MG/ML IJ SOLN
INTRAMUSCULAR | Status: AC
  Filled 2014-08-04: qty 5

## 2014-08-04 MED ORDER — BISACODYL 10 MG RE SUPP: 10.0000 mg | Freq: Every day | RECTAL | Status: DC | PRN

## 2014-08-04 MED ORDER — 0.9 % SODIUM CHLORIDE (POUR BTL) OPTIME
TOPICAL | Status: DC | PRN
  Administered 2014-08-04: 1000 mL

## 2014-08-04 MED ORDER — PROPOFOL 10 MG/ML IV BOLUS
INTRAVENOUS | Status: AC
  Filled 2014-08-04: qty 20

## 2014-08-04 MED ORDER — PROTAMINE SULFATE 10 MG/ML IV SOLN
INTRAVENOUS | Status: AC
  Filled 2014-08-04: qty 5

## 2014-08-04 MED ORDER — HEPARIN SODIUM (PORCINE) 1000 UNIT/ML IJ SOLN
INTRAMUSCULAR | Status: DC | PRN
  Administered 2014-08-04: 8 mL via INTRAVENOUS

## 2014-08-04 MED ORDER — PROPOFOL 10 MG/ML IV BOLUS
INTRAVENOUS | Status: DC | PRN
  Administered 2014-08-04: 80 mg via INTRAVENOUS
  Administered 2014-08-04: 20 mg via INTRAVENOUS

## 2014-08-04 MED ORDER — LIDOCAINE HCL (CARDIAC) 20 MG/ML IV SOLN
INTRAVENOUS | Status: DC | PRN
  Administered 2014-08-04: 100 mg via INTRAVENOUS

## 2014-08-04 MED ORDER — HYDRALAZINE HCL 20 MG/ML IJ SOLN
INTRAMUSCULAR | Status: AC
  Filled 2014-08-04: qty 1

## 2014-08-04 MED ORDER — PANTOPRAZOLE SODIUM 40 MG PO TBEC
40.0000 mg | DELAYED_RELEASE_TABLET | Freq: Every day | ORAL | Status: DC
  Administered 2014-08-04 – 2014-08-10 (×7): 40 mg via ORAL
  Filled 2014-08-04 (×7): qty 1

## 2014-08-04 MED ORDER — PHENYLEPHRINE HCL 10 MG/ML IJ SOLN
INTRAMUSCULAR | Status: DC | PRN
  Administered 2014-08-04: 80 ug via INTRAVENOUS

## 2014-08-04 MED ORDER — PROTAMINE SULFATE 10 MG/ML IV SOLN
INTRAVENOUS | Status: DC | PRN
  Administered 2014-08-04: 50 mg via INTRAVENOUS

## 2014-08-04 MED ORDER — ROCURONIUM BROMIDE 100 MG/10ML IV SOLN
INTRAVENOUS | Status: DC | PRN
  Administered 2014-08-04: 40 mg via INTRAVENOUS

## 2014-08-04 MED ORDER — ROCURONIUM BROMIDE 50 MG/5ML IV SOLN
INTRAVENOUS | Status: AC
  Filled 2014-08-04: qty 1

## 2014-08-04 MED ORDER — ARTIFICIAL TEARS OP OINT
TOPICAL_OINTMENT | OPHTHALMIC | Status: DC | PRN
  Administered 2014-08-04: 1 via OPHTHALMIC

## 2014-08-04 MED ORDER — SENNOSIDES-DOCUSATE SODIUM 8.6-50 MG PO TABS
1.0000 | ORAL_TABLET | Freq: Every evening | ORAL | Status: DC | PRN
  Administered 2014-08-07: 1 via ORAL
  Filled 2014-08-04 (×2): qty 1

## 2014-08-04 MED ORDER — SODIUM CHLORIDE 0.9 % IV SOLN
500.0000 mL | Freq: Once | INTRAVENOUS | Status: AC | PRN
Start: 2014-08-04 — End: 2014-08-04

## 2014-08-04 MED ORDER — INSULIN ASPART 100 UNIT/ML ~~LOC~~ SOLN
0.0000 [IU] | Freq: Three times a day (TID) | SUBCUTANEOUS | Status: DC
  Administered 2014-08-05: 3 [IU] via SUBCUTANEOUS
  Administered 2014-08-05 (×2): 2 [IU] via SUBCUTANEOUS
  Administered 2014-08-06: 5 [IU] via SUBCUTANEOUS
  Administered 2014-08-06: 2 [IU] via SUBCUTANEOUS
  Administered 2014-08-06: 3 [IU] via SUBCUTANEOUS
  Administered 2014-08-07: 2 [IU] via SUBCUTANEOUS
  Administered 2014-08-07: 5 [IU] via SUBCUTANEOUS
  Administered 2014-08-08: 3 [IU] via SUBCUTANEOUS
  Administered 2014-08-08: 5 [IU] via SUBCUTANEOUS
  Administered 2014-08-09: 8 [IU] via SUBCUTANEOUS
  Administered 2014-08-09 – 2014-08-10 (×2): 3 [IU] via SUBCUTANEOUS

## 2014-08-04 MED ORDER — ENOXAPARIN SODIUM 30 MG/0.3ML ~~LOC~~ SOLN
30.0000 mg | SUBCUTANEOUS | Status: DC
  Administered 2014-08-04: 30 mg via SUBCUTANEOUS
  Filled 2014-08-04 (×2): qty 0.3

## 2014-08-04 MED ORDER — POTASSIUM CHLORIDE CRYS ER 20 MEQ PO TBCR: 20.0000 meq | EXTENDED_RELEASE_TABLET | Freq: Every day | ORAL | Status: DC | PRN

## 2014-08-04 MED ORDER — SODIUM CHLORIDE 0.9 % IV SOLN
INTRAVENOUS | Status: DC
  Administered 2014-08-04: 19:00:00 via INTRAVENOUS

## 2014-08-04 MED ORDER — ONDANSETRON HCL 4 MG/2ML IJ SOLN
INTRAMUSCULAR | Status: AC
Start: 2014-08-04 — End: 2014-08-04
  Filled 2014-08-04: qty 2

## 2014-08-04 MED ORDER — FENTANYL CITRATE 0.05 MG/ML IJ SOLN
INTRAMUSCULAR | Status: DC | PRN
  Administered 2014-08-04: 50 ug via INTRAVENOUS
  Administered 2014-08-04: 100 ug via INTRAVENOUS
  Administered 2014-08-04 (×4): 50 ug via INTRAVENOUS

## 2014-08-04 MED ORDER — HEPARIN SODIUM (PORCINE) 5000 UNIT/ML IJ SOLN
INTRAMUSCULAR | Status: DC | PRN
  Administered 2014-08-04: 12:00:00

## 2014-08-04 MED ORDER — LIDOCAINE HCL (CARDIAC) 20 MG/ML IV SOLN
INTRAVENOUS | Status: AC
  Filled 2014-08-04: qty 5

## 2014-08-04 MED ORDER — FENTANYL CITRATE 0.05 MG/ML IJ SOLN: 25.0000 ug | INTRAMUSCULAR | Status: DC | PRN

## 2014-08-04 MED ORDER — GLYCOPYRROLATE 0.2 MG/ML IJ SOLN
INTRAMUSCULAR | Status: DC | PRN
  Administered 2014-08-04: 0.6 mg via INTRAVENOUS
  Administered 2014-08-04: 0.2 mg via INTRAVENOUS

## 2014-08-04 MED ORDER — EPHEDRINE SULFATE 50 MG/ML IJ SOLN
INTRAMUSCULAR | Status: DC | PRN
  Administered 2014-08-04: 10 mg via INTRAVENOUS
  Administered 2014-08-04: 5 mg via INTRAVENOUS
  Administered 2014-08-04: 10 mg via INTRAVENOUS

## 2014-08-04 MED ORDER — MAGNESIUM SULFATE 40 MG/ML IJ SOLN
2.0000 g | Freq: Every day | INTRAMUSCULAR | Status: DC | PRN
  Filled 2014-08-04: qty 50

## 2014-08-04 MED ORDER — NEOSTIGMINE METHYLSULFATE 10 MG/10ML IV SOLN
INTRAVENOUS | Status: DC | PRN
  Administered 2014-08-04: 4 mg via INTRAVENOUS

## 2014-08-04 MED ORDER — ONDANSETRON HCL 4 MG/2ML IJ SOLN
INTRAMUSCULAR | Status: DC | PRN
  Administered 2014-08-04: 4 mg via INTRAVENOUS

## 2014-08-04 MED ORDER — OXYCODONE-ACETAMINOPHEN 5-325 MG PO TABS: 1.0000 | ORAL_TABLET | ORAL | Status: DC | PRN

## 2014-08-04 MED ORDER — DOPAMINE-DEXTROSE 3.2-5 MG/ML-% IV SOLN
3.0000 ug/kg/min | INTRAVENOUS | Status: DC
Start: 2014-08-04 — End: 2014-08-06

## 2014-08-04 SURGICAL SUPPLY — 82 items
APL SKNCLS STERI-STRIP NONHPOA (GAUZE/BANDAGES/DRESSINGS) ×2
BANDAGE ESMARK 6X9 LF (GAUZE/BANDAGES/DRESSINGS) IMPLANT
BENZOIN TINCTURE PRP APPL 2/3 (GAUZE/BANDAGES/DRESSINGS) ×4 IMPLANT
BLADE 10 SAFETY STRL DISP (BLADE) ×2 IMPLANT
BNDG CMPR 9X6 STRL LF SNTH (GAUZE/BANDAGES/DRESSINGS) ×2
BNDG CONFORM 2 STRL LF (GAUZE/BANDAGES/DRESSINGS) ×2 IMPLANT
BNDG ESMARK 6X9 LF (GAUZE/BANDAGES/DRESSINGS) ×4
BNDG GAUZE ELAST 4 BULKY (GAUZE/BANDAGES/DRESSINGS) ×4 IMPLANT
CANISTER SUCTION 2500CC (MISCELLANEOUS) ×4 IMPLANT
CANNULA VESSEL 3MM 2 BLNT TIP (CANNULA) ×2 IMPLANT
CLIP LIGATING EXTRA MED SLVR (CLIP) ×4 IMPLANT
CLIP LIGATING EXTRA SM BLUE (MISCELLANEOUS) ×6 IMPLANT
CLOSURE WOUND 1/2 X4 (GAUZE/BANDAGES/DRESSINGS) ×1
COVER PROBE W GEL 5X96 (DRAPES) IMPLANT
COVER SURGICAL LIGHT HANDLE (MISCELLANEOUS) ×4 IMPLANT
CUFF TOURNIQUET SINGLE 34IN LL (TOURNIQUET CUFF) IMPLANT
CUFF TOURNIQUET SINGLE 44IN (TOURNIQUET CUFF) IMPLANT
DRAIN SNY 10X20 3/4 PERF (WOUND CARE) IMPLANT
DRAPE EXTREMITY T 121X128X90 (DRAPE) ×2 IMPLANT
DRAPE WARM FLUID 44X44 (DRAPE) ×4 IMPLANT
DRAPE X-RAY CASS 24X20 (DRAPES) IMPLANT
DRSG COVADERM 4X10 (GAUZE/BANDAGES/DRESSINGS) ×2 IMPLANT
DRSG COVADERM 4X6 (GAUZE/BANDAGES/DRESSINGS) ×2 IMPLANT
DRSG COVADERM 4X8 (GAUZE/BANDAGES/DRESSINGS) ×2 IMPLANT
ELECT REM PT RETURN 9FT ADLT (ELECTROSURGICAL) ×4
ELECTRODE REM PT RTRN 9FT ADLT (ELECTROSURGICAL) ×2 IMPLANT
EVACUATOR SILICONE 100CC (DRAIN) IMPLANT
GAUZE SPONGE 2X2 8PLY STRL LF (GAUZE/BANDAGES/DRESSINGS) ×2 IMPLANT
GAUZE SPONGE 4X4 12PLY STRL (GAUZE/BANDAGES/DRESSINGS) ×4 IMPLANT
GLOVE BIO SURGEON STRL SZ7.5 (GLOVE) ×2 IMPLANT
GLOVE BIOGEL PI IND STRL 6.5 (GLOVE) IMPLANT
GLOVE BIOGEL PI IND STRL 7.0 (GLOVE) IMPLANT
GLOVE BIOGEL PI IND STRL 7.5 (GLOVE) IMPLANT
GLOVE BIOGEL PI INDICATOR 6.5 (GLOVE) ×8
GLOVE BIOGEL PI INDICATOR 7.0 (GLOVE) ×4
GLOVE BIOGEL PI INDICATOR 7.5 (GLOVE) ×4
GLOVE ECLIPSE 6.5 STRL STRAW (GLOVE) ×2 IMPLANT
GLOVE SS BIOGEL STRL SZ 7 (GLOVE) IMPLANT
GLOVE SS BIOGEL STRL SZ 7.5 (GLOVE) ×2 IMPLANT
GLOVE SUPERSENSE BIOGEL SZ 7 (GLOVE) ×2
GLOVE SUPERSENSE BIOGEL SZ 7.5 (GLOVE) ×2
GOWN L4 LG 24 PK N/S (GOWN DISPOSABLE) ×2 IMPLANT
GOWN STRL REUS W/ TWL LRG LVL3 (GOWN DISPOSABLE) ×6 IMPLANT
GOWN STRL REUS W/ TWL XL LVL3 (GOWN DISPOSABLE) IMPLANT
GOWN STRL REUS W/TWL LRG LVL3 (GOWN DISPOSABLE) ×12
GOWN STRL REUS W/TWL XL LVL3 (GOWN DISPOSABLE) ×8
INSERT FOGARTY SM (MISCELLANEOUS) IMPLANT
KIT BASIN OR (CUSTOM PROCEDURE TRAY) ×4 IMPLANT
KIT ROOM TURNOVER OR (KITS) ×4 IMPLANT
NEEDLE 22X1 1/2 (OR ONLY) (NEEDLE) IMPLANT
NS IRRIG 1000ML POUR BTL (IV SOLUTION) ×8 IMPLANT
PACK GENERAL/GYN (CUSTOM PROCEDURE TRAY) ×2 IMPLANT
PACK PERIPHERAL VASCULAR (CUSTOM PROCEDURE TRAY) ×4 IMPLANT
PAD ARMBOARD 7.5X6 YLW CONV (MISCELLANEOUS) ×8 IMPLANT
PADDING CAST COTTON 6X4 STRL (CAST SUPPLIES) ×2 IMPLANT
PROBE PENCIL 8 MHZ STRL DISP (MISCELLANEOUS) ×2 IMPLANT
SET COLLECT BLD 21X3/4 12 (NEEDLE) IMPLANT
SPECIMEN JAR SMALL (MISCELLANEOUS) ×4 IMPLANT
SPONGE GAUZE 2X2 STER 10/PKG (GAUZE/BANDAGES/DRESSINGS)
SPONGE GAUZE 4X4 12PLY STER LF (GAUZE/BANDAGES/DRESSINGS) ×2 IMPLANT
SPONGE LAP 18X18 X RAY DECT (DISPOSABLE) ×2 IMPLANT
STAPLER VISISTAT 35W (STAPLE) IMPLANT
STOPCOCK 4 WAY LG BORE MALE ST (IV SETS) IMPLANT
STRIP CLOSURE SKIN 1/2X4 (GAUZE/BANDAGES/DRESSINGS) ×3 IMPLANT
SUT ETHILON 3 0 PS 1 (SUTURE) ×4 IMPLANT
SUT PROLENE 5 0 C 1 24 (SUTURE) ×4 IMPLANT
SUT PROLENE 6 0 CC (SUTURE) ×16 IMPLANT
SUT SILK 2 0 SH (SUTURE) ×4 IMPLANT
SUT SILK 3 0 (SUTURE) ×4
SUT SILK 3-0 18XBRD TIE 12 (SUTURE) IMPLANT
SUT VIC AB 2-0 CTX 36 (SUTURE) ×8 IMPLANT
SUT VIC AB 3-0 SH 27 (SUTURE) ×12
SUT VIC AB 3-0 SH 27X BRD (SUTURE) ×4 IMPLANT
SUT VICRYL 4-0 PS2 18IN ABS (SUTURE) ×2 IMPLANT
SYR CONTROL 10ML LL (SYRINGE) IMPLANT
TOWEL OR 17X24 6PK STRL BLUE (TOWEL DISPOSABLE) ×8 IMPLANT
TOWEL OR 17X26 10 PK STRL BLUE (TOWEL DISPOSABLE) ×8 IMPLANT
TRAY FOLEY CATH 16FRSI W/METER (SET/KITS/TRAYS/PACK) ×4 IMPLANT
TUBE ANAEROBIC SPECIMEN COL (MISCELLANEOUS) IMPLANT
TUBING EXTENTION W/L.L. (IV SETS) IMPLANT
UNDERPAD 30X30 INCONTINENT (UNDERPADS AND DIAPERS) ×4 IMPLANT
WATER STERILE IRR 1000ML POUR (IV SOLUTION) ×4 IMPLANT

## 2014-08-04 NOTE — Anesthesia Preprocedure Evaluation (Addendum)
Anesthesia Evaluation  Patient identified by MRN, date of birth, ID band Patient awake    Reviewed: Allergy & Precautions, H&P , NPO status , Patient's Chart, lab work & pertinent test results, reviewed documented beta blocker date and time   Airway Mallampati: II TM Distance: >3 FB     Dental  (+) Edentulous Upper, Edentulous Lower   Pulmonary shortness of breath and with exertion, asthma ,    + decreased breath sounds      Cardiovascular hypertension, Pt. on medications and Pt. on home beta blockers + CAD, + Past MI, + Peripheral Vascular Disease and +CHF Rhythm:Regular Rate:Normal     Neuro/Psych    GI/Hepatic   Endo/Other  diabetes, Type 2  Renal/GU Renal disease     Musculoskeletal   Abdominal (+) + obese,   Peds  Hematology   Anesthesia Other Findings   Reproductive/Obstetrics                          Anesthesia Physical Anesthesia Plan  ASA: III  Anesthesia Plan: General   Post-op Pain Management:    Induction: Intravenous  Airway Management Planned: Oral ETT  Additional Equipment: Arterial line and CVP  Intra-op Plan:   Post-operative Plan: Extubation in OR and Possible Post-op intubation/ventilation  Informed Consent: I have reviewed the patients History and Physical, chart, labs and discussed the procedure including the risks, benefits and alternatives for the proposed anesthesia with the patient or authorized representative who has indicated his/her understanding and acceptance.   Dental advisory given  Plan Discussed with:   Anesthesia Plan Comments:         Anesthesia Quick Evaluation

## 2014-08-04 NOTE — Transfer of Care (Signed)
Immediate Anesthesia Transfer of Care Note  Patient: Dorothy Bartlett  Procedure(s) Performed: Procedure(s): RIGHT FEMORAL-PERONEAL ARTERY BYPASS GRAFT (Right) AMPUTATION DIGIT-RIGHT 3RD TOE (Right)  Patient Location: PACU  Anesthesia Type:General  Level of Consciousness: awake  Airway & Oxygen Therapy: Patient Spontanous Breathing and Patient connected to face mask oxygen  Post-op Assessment: Report given to PACU RN and Post -op Vital signs reviewed and stable  Post vital signs: Reviewed and stable  Complications: No apparent anesthesia complications

## 2014-08-04 NOTE — Anesthesia Procedure Notes (Addendum)
Procedure Name: Intubation Date/Time: 08/04/2014 11:51 AM Performed by: Reine JustFLOWERS, Jamear Carbonneau T Pre-anesthesia Checklist: Patient identified, Emergency Drugs available, Suction available, Patient being monitored and Timeout performed Patient Re-evaluated:Patient Re-evaluated prior to inductionOxygen Delivery Method: Circle system utilized and Simple face mask Preoxygenation: Pre-oxygenation with 100% oxygen Intubation Type: Combination inhalational/ intravenous induction Ventilation: Mask ventilation with difficulty, Oral airway inserted - appropriate to patient size and Two handed mask ventilation required Laryngoscope Size: Miller and 2 Grade View: Grade I Tube type: Subglottic suction tube Tube size: 7.5 mm Number of attempts: 1 Airway Equipment and Method: Patient positioned with wedge pillow and Stylet Placement Confirmation: ETT inserted through vocal cords under direct vision,  positive ETCO2 and breath sounds checked- equal and bilateral Secured at: 22 cm Tube secured with: Tape Dental Injury: Teeth and Oropharynx as per pre-operative assessment

## 2014-08-04 NOTE — Op Note (Signed)
OPERATIVE REPORT  DATE OF SURGERY: 08/04/2014  PATIENT: Dorothy Bartlett, 80 y.o. female MRN: [id number]  DOB: 1926-07-10  PRE-OPERATIVE DIAGNOSIS: Ischemia right foot with gangrene right third toe  POST-OPERATIVE DIAGNOSIS:  Same  PROCEDURE: Right femoral to peroneal bypass with in situ saphenous vein, right third toe amputation  SURGEON:  Dorothy Began, M.D.  PHYSICIAN ASSISTANT: Dr. Leonette Most fields, Karsten Ro PA-C  ANESTHESIA:  Enteral  EBL: 300 ml  Total I/O In: 800 [I.V.:800] Out: 975 [Urine:650; Blood:325]  BLOOD ADMINISTERED: None  DRAINS: None  SPECIMEN: Right third toe  COUNTS CORRECT:  YES  PLAN OF CARE: PACU   PATIENT DISPOSITION:  PACU - hemodynamically stable  PROCEDURE DETAILS: The patient was taken to the operating placed supine position where the area of the right groin right leg and right foot prepped and draped in usual sterile fashion. SonoSite option was used to mark the saphenous vein from the calf to the groin. Incision was made over the calf the saphenous vein and the vein was of good caliber. Tributary branches were ligated with 30 4 silk ties and divided. The vein was mobilized and the calf and was left in situ for the rest of the bypass. The tibial vessels were exposed the medial approach by taking down the fascia and reflecting the gastrocnemius muscle posteriorly. The peroneal artery was calcified but was patent and Doppler flow at this level. The saphenous vein was then exposed up to the level of the groin through the medial thigh leaving several several skin bridges. The artery in the groin was exposed and the common femoral artery did have some plaque excellent pulse and was of good caliber. The saphenous vein was exposed at the saphenofemoral junction. Trochanter branches were ligated with 30 and 4 surpassed divided. The saphenous vein was occluded at the saphenofemoral junction with a Cooley clamp the saphenous vein was excised from the  saphenofemoral junction. The the saphenofemoral junction was oversewn with a running 5-0 Prolene in 2 layers. The patient was given 8000 units heparin effect circulation time the common femoral artery was occluded proximal and distally was opened with a 11 blade and extended longitudinally with Potts scissors. The vein was spatulated and was brought into approximation with the artery and was sewn end-to-side to the artery with running 6-0 Prolene suture. Prior to completion of the anastomosis the usual flushing maneuvers were undertaken. The anastomosis was completed and clamps were removed with a technically good anastomosis. Next using a Mills valvulotome the vein were lysed cerebrals sidebranches of the saphenous vein. This gave excellent flow through the saphenous vein. V. leg was elevated the pneumatic tourniquet was placed in the above-knee position. The leg was exsanguinated with a Esmarch tourniquet and the pneumatic tourniquet was inflated. The peroneal artery was opened with an 11 blade and sent longitudinally Potts scissors. Was quite diseased. A 1-1/2 dilator would not pass the artery at this position. 1 dilator wouldn't pass. The vein was sewn end-to-side to the artery with a running 6-0 Prolene suture. Prior to completion the anastomosis the usual flushing maneuvers were undertaken. The patient had very poor Doppler flow through the artery. Felt this was probably due to the caliber of the artery at this level. For this reason the calf incision was extended further distally and the peroneal artery was exposed further distally. It did appear to be somewhat softer at this area but no difference in caliber. The leg was again elevated and exsanguinated with Esmarch tourniquet the pneumatic  tourniquet was again inflated. The artery was opened at this level and did have better flow channel. The segment of saphenous vein not been used for initial bypass was brought onto the field and was spatulated and sewn  end-to-side to the peroneal artery with a running 6-0 Prolene suture. A 1-1/2 dilator passed easily through the distal anastomosis and there was good backbleeding after release of the tourniquet. The prior placed distal anastomosis was ligated and the vein saphenous vein was divided and the new interposition portion of saphenous vein was sewn end to end to the old vein spatulating this. Excellent Doppler flow was noted through the vein and through the peroneal artery. Next sidebranches of the saphenous vein were identified with Doppler and were ligated. It was irrigated with saline. Hemostasis daily cautery. Wounds were closed with 20 in the subcutaneous tissue and groin and thigh. The cath incisions were closed with 30 in several layers and skin was closed for septic Vicryl stitch. Sterile dressings were applied. Attention was then turned to the right third toe which was gangrenous. The injury did not involve the metatarsal head. A fishmouth incision was placed at the base of the toe and the bone was resected back to the metatarsal head. There was good bleeding. The wounds were irrigated with saline hemostasis and cautery. The wounds were closed with mattress 3-0 nylon sutures. Sterile dressing was applied the patient was taken to the recovery room in stable condition   Dorothy Bartlett, M.D. 08/04/2014 4:45 PM

## 2014-08-04 NOTE — Interval H&P Note (Signed)
History and Physical Interval Note:  08/04/2014 10:45 AM  Dorothy Bartlett  has presented today for surgery, with the diagnosis of Peripheral Vascular Disease  The various methods of treatment have been discussed with the patient and family. After consideration of risks, benefits and other options for treatment, the patient has consented to  Procedure(s): BYPASS GRAFT FEMORAL-POPLITEAL ARTERY (Right) AMPUTATION DIGIT-RIGHT 2ND TOE (Right) as a surgical intervention .  The patient's history has been reviewed, patient examined, no change in status, stable for surgery.  I have reviewed the patient's chart and labs.  Questions were answered to the patient's satisfaction.     Gatlin Kittell

## 2014-08-05 ENCOUNTER — Encounter (HOSPITAL_COMMUNITY): Payer: Self-pay | Admitting: Vascular Surgery

## 2014-08-05 DIAGNOSIS — Z09 Encounter for follow-up examination after completed treatment for conditions other than malignant neoplasm: Secondary | ICD-10-CM

## 2014-08-05 LAB — CBC
HEMATOCRIT: 29.3 % — AB (ref 36.0–46.0)
HEMATOCRIT: 26 % — AB (ref 36.0–46.0)
HEMOGLOBIN: 9.3 g/dL — AB (ref 12.0–15.0)
Hemoglobin: 8.2 g/dL — ABNORMAL LOW (ref 12.0–15.0)
MCH: 28.7 pg (ref 26.0–34.0)
MCH: 28.7 pg (ref 26.0–34.0)
MCHC: 31.7 g/dL (ref 30.0–36.0)
MCHC: 31.5 g/dL (ref 30.0–36.0)
MCV: 90.4 fL (ref 78.0–100.0)
MCV: 90.9 fL (ref 78.0–100.0)
Platelets: 147 10*3/uL — ABNORMAL LOW (ref 150–400)
Platelets: 146 10*3/uL — ABNORMAL LOW (ref 150–400)
RBC: 3.24 MIL/uL — ABNORMAL LOW (ref 3.87–5.11)
RBC: 2.86 MIL/uL — AB (ref 3.87–5.11)
RDW: 15.1 % (ref 11.5–15.5)
RDW: 15 % (ref 11.5–15.5)
WBC: 10.6 10*3/uL — ABNORMAL HIGH (ref 4.0–10.5)
WBC: 9.7 10*3/uL (ref 4.0–10.5)

## 2014-08-05 LAB — CREATININE, SERUM
Creatinine, Ser: 3.16 mg/dL — ABNORMAL HIGH (ref 0.50–1.10)
GFR, EST AFRICAN AMERICAN: 14 mL/min — AB (ref >90)
GFR, EST NON AFRICAN AMERICAN: 12 mL/min — AB (ref >90)

## 2014-08-05 LAB — BASIC METABOLIC PANEL
Anion gap: 17 — ABNORMAL HIGH (ref 5–15)
BUN: 78 mg/dL — AB (ref 6–23)
CHLORIDE: 106 meq/L (ref 96–112)
CO2: 20 meq/L (ref 19–32)
Calcium: 8.7 mg/dL (ref 8.4–10.5)
Creatinine, Ser: 2.82 mg/dL — ABNORMAL HIGH (ref 0.50–1.10)
GFR calc Af Amer: 16 mL/min — ABNORMAL LOW (ref >90)
GFR, EST NON AFRICAN AMERICAN: 14 mL/min — AB (ref >90)
GLUCOSE: 142 mg/dL — AB (ref 70–99)
Potassium: 4.3 mEq/L (ref 3.7–5.3)
Sodium: 143 mEq/L (ref 137–147)

## 2014-08-05 LAB — GLUCOSE, CAPILLARY
Glucose-Capillary: 133 mg/dL — ABNORMAL HIGH (ref 70–99)
Glucose-Capillary: 125 mg/dL — ABNORMAL HIGH (ref 70–99)
Glucose-Capillary: 159 mg/dL — ABNORMAL HIGH (ref 70–99)
Glucose-Capillary: 101 mg/dL — ABNORMAL HIGH (ref 70–99)

## 2014-08-05 MED ORDER — ENOXAPARIN SODIUM 30 MG/0.3ML ~~LOC~~ SOLN
30.0000 mg | SUBCUTANEOUS | Status: DC
  Administered 2014-08-06 – 2014-08-09 (×4): 30 mg via SUBCUTANEOUS
  Filled 2014-08-05 (×5): qty 0.3

## 2014-08-05 NOTE — Evaluation (Signed)
Physical Therapy Evaluation Patient Details Name: Dorothy Bartlett MRN: [id number] DOB: 11/08/1926 Today's Date: 08/05/2014   History of Present Illness  Pt s/p right femoral to peroneal bypass and right third toe amputation. PMH - CHF, MI, DM, LLE bypass  Clinical Impression  Pt admitted with above. Pt currently with functional limitations due to the deficits listed below (see PT Problem List).  Pt will benefit from skilled PT to increase their independence and safety with mobility to allow discharge to the venue listed below. Expect pt will make slow progress and require ST-SNF prior to return home.      Follow Up Recommendations SNF    Equipment Recommendations  None recommended by PT    Recommendations for Other Services       Precautions / Restrictions Precautions Precautions: Fall Restrictions Weight Bearing Restrictions: No      Mobility  Bed Mobility Overal bed mobility: Needs Assistance Bed Mobility: Sit to Supine;Rolling Rolling: Min assist     Sit to supine: +2 for physical assistance;Max assist   General bed mobility comments: Assist to lower trunk and to bring legs up into bed.  Transfers Overall transfer level: Needs assistance Equipment used: Rolling walker (2 wheeled);Ambulation equipment used Huntley Dec(Sara Plus) Transfers: Sit to/from UGI CorporationStand;Stand Pivot Transfers Sit to Stand: +2 physical assistance;Max assist Stand pivot transfers: +2 physical assistance;Mod assist (with Newman NipSara Plus)       General transfer comment: Initially stood pt from chair with +2 max A with walker. Used Huntley DecSara Plus to perform chair to bed transfer - had to use chair pad to assist pt's hips up when using Huntley DecSara Plus and had to provide support to pt's hips during pivot to bed.  Ambulation/Gait                Stairs            Wheelchair Mobility    Modified Rankin (Stroke Patients Only)       Balance Overall balance assessment: Needs assistance Sitting-balance support:  Bilateral upper extremity supported;Feet supported Sitting balance-Leahy Scale: Poor Sitting balance - Comments: Verbal cues to lean more anteriorly Postural control: Posterior lean Standing balance support: Bilateral upper extremity supported Standing balance-Leahy Scale: Zero                               Pertinent Vitals/Pain Pain Assessment: Faces Faces Pain Scale: Hurts even more Pain Location: Rt leg Pain Intervention(s): Limited activity within patient's tolerance;Repositioned    Home Living Family/patient expects to be discharged to:: Skilled nursing facility Living Arrangements: Alone Available Help at Discharge: Family Type of Home: House Home Access: Stairs to enter Entrance Stairs-Rails: Left Entrance Stairs-Number of Steps: 2 Home Layout: One level Home Equipment: Walker - 2 wheels;Bedside commode      Prior Function Level of Independence: Needs assistance   Gait / Transfers Assistance Needed: Daughter assist if needed for transfers. Pt amb with walker  ADL's / Homemaking Assistance Needed: Daughter cooks and does housework        Hand Dominance   Dominant Hand: Right    Extremity/Trunk Assessment   Upper Extremity Assessment: Defer to OT evaluation           Lower Extremity Assessment: RLE deficits/detail;Generalized weakness RLE Deficits / Details: Strength <3/5 due to post op pain       Communication   Communication: HOH  Cognition Arousal/Alertness: Awake/alert Behavior During Therapy: WFL for tasks assessed/performed Overall Cognitive  Status: Impaired/Different from baseline Area of Impairment: Memory     Memory: Decreased short-term memory              General Comments      Exercises        Assessment/Plan    PT Assessment Patient needs continued PT services  PT Diagnosis Difficulty walking;Generalized weakness;Acute pain   PT Problem List Decreased strength;Decreased activity tolerance;Decreased  balance;Decreased mobility;Decreased knowledge of use of DME;Obesity;Pain  PT Treatment Interventions DME instruction;Gait training;Functional mobility training;Therapeutic activities;Therapeutic exercise;Balance training;Patient/family education   PT Goals (Current goals can be found in the Care Plan section) Acute Rehab PT Goals Patient Stated Goal: Return home PT Goal Formulation: With patient Time For Goal Achievement: 08/12/14 Potential to Achieve Goals: Good    Frequency Min 3X/week   Barriers to discharge        Co-evaluation               End of Session Equipment Utilized During Treatment: Other (comment) Huntley Dec Plus) Activity Tolerance: Patient limited by fatigue Patient left: in bed;with call bell/phone within reach;with nursing/sitter in room;with family/visitor present Nurse Communication: Mobility status;Need for lift equipment         Time: 1353-1420 PT Time Calculation (min): 27 min   Charges:   PT Evaluation $Initial PT Evaluation Tier I: 1 Procedure PT Treatments $Gait Training: 23-37 mins   PT G Codes:          Asia Favata 08-Aug-2014, 3:48 PM  [facility] PT [phone]

## 2014-08-05 NOTE — Progress Notes (Signed)
Utilization review completed.  

## 2014-08-05 NOTE — Progress Notes (Signed)
VASCULAR LAB PRELIMINARY  ARTERIAL  ABI completed:    RIGHT    LEFT    PRESSURE WAVEFORM  PRESSURE WAVEFORM  BRACHIAL 113 Triphasic BRACHIAL 122 Triphasic  DP 80 Dampened monophasic DP  Absent  AT   AT    PT 76 Severely dampened monophasic PT Noncompressible Dampened monophasic  PER 97 Dampened monophasic PER    GREAT TOE  NA GREAT TOE  NA    RIGHT LEFT  ABI 0.80 Noncompressible    Thr right ABI is suggestive of mild arterial insufficiency. The left ABI is noncompressible.  08/05/2014 5:40 PM Gertie FeyMichelle Abdulla Pooley, RVT, RDCS, RDMS

## 2014-08-05 NOTE — Progress Notes (Addendum)
   08/05/2014 8:10 AM 1 Day Post-Op  Subjective:  No complaints-RN states after morphine, she became a little confused.  Tm 99.1 120's-190's systolic  (129 systolic this am) HR 50's-80's 95% RA  Filed Vitals:   08/05/14 0356  BP: 129/43  Pulse: 89  Temp: 99.1 F (37.3 C)  Resp: 18    Physical Exam: Incisions:  Covered with bandage--some bloody ooze from saphenous vein harvest site Extremities:  + palpable right graft pulse; right foot is bandaged.  CBC    Component Value Date/Time   WBC 10.6* 08/05/2014 0430   WBC 8.4 09/03/2008 1100   RBC 3.24* 08/05/2014 0430   RBC 3.67* 09/03/2008 1100   HGB 9.3* 08/05/2014 0430   HGB 11.3* 09/03/2008 1100   HCT 29.3* 08/05/2014 0430   HCT 34.3* 09/03/2008 1100   PLT 147* 08/05/2014 0430   PLT 146 09/03/2008 1100   MCV 90.4 08/05/2014 0430   MCV 93.5 09/03/2008 1100   MCH 28.7 08/05/2014 0430   MCH 30.9 09/03/2008 1100   MCHC 31.7 08/05/2014 0430   MCHC 33.0 09/03/2008 1100   RDW 15.1 08/05/2014 0430   RDW 13.2 09/03/2008 1100   LYMPHSABS 1.6 05/05/2014 1005   LYMPHSABS 2.4 09/03/2008 1100   MONOABS 0.7 05/05/2014 1005   MONOABS 0.6 09/03/2008 1100   EOSABS 0.1 05/05/2014 1005   EOSABS 0.1 09/03/2008 1100   BASOSABS 0.0 05/05/2014 1005   BASOSABS 0.0 09/03/2008 1100    BMET    Component Value Date/Time   NA 143 08/05/2014 0430   K 4.3 08/05/2014 0430   CL 106 08/05/2014 0430   CO2 20 08/05/2014 0430   GLUCOSE 142* 08/05/2014 0430   BUN 78* 08/05/2014 0430   CREATININE 2.82* 08/05/2014 0430   CALCIUM 8.7 08/05/2014 0430   GFRNONAA 14* 08/05/2014 0430   GFRAA 16* 08/05/2014 0430    INR    Component Value Date/Time   INR 0.97 02/22/2014 1620     Intake/Output Summary (Last 24 hours) at 08/05/14 0810 Last data filed at 08/05/14 0400  Gross per 24 hour  Intake   1860 ml  Output   1825 ml  Net     35 ml     Assessment:  80 y.o. female is s/p:  Right femoral to peroneal bypass with in situ saphenous vein, right third toe amputation  1 Day Post-Op  Plan: -pt doing well  this am--palpable graft pulse -DVT prophylaxis:  Lovenox -creatinine improved from yesterday (2.82 down from 3.53) -increase mobilization -will keep in stepdown today   Doreatha MassedSamantha Rhyne, PA-C [facility] [phone] 08/05/2014 8:10 AM   I have examined the patient, reviewed and agree with above. The patient is comfortable this morning. Some drainage on her dressing. No hematoma. 2+ subcutaneous graft pulse with a well-perfused foot. Creatinine remained stable.  Remijio Holleran, MD 08/05/2014 8:39 AM

## 2014-08-05 NOTE — Anesthesia Postprocedure Evaluation (Signed)
  Anesthesia Post-op Note  Patient: Dorothy Bartlett  Procedure(s) Performed: Procedure(s): RIGHT FEMORAL-PERONEAL ARTERY BYPASS GRAFT (Right) AMPUTATION DIGIT-RIGHT 3RD TOE (Right)  Patient Location: PACU  Anesthesia Type:General  Level of Consciousness: awake and alert   Airway and Oxygen Therapy: Patient Spontanous Breathing  Post-op Pain: none  Post-op Assessment: Post-op Vital signs reviewed, Patient's Cardiovascular Status Stable and Respiratory Function Stable  Post-op Vital Signs: Reviewed  Filed Vitals:   08/05/14 1650  BP: 113/48  Pulse: 78  Temp:   Resp:     Complications: No apparent anesthesia complications

## 2014-08-06 LAB — GLUCOSE, CAPILLARY
GLUCOSE-CAPILLARY: 202 mg/dL — AB (ref 70–99)
GLUCOSE-CAPILLARY: 166 mg/dL — AB (ref 70–99)
GLUCOSE-CAPILLARY: 97 mg/dL (ref 70–99)
Glucose-Capillary: 127 mg/dL — ABNORMAL HIGH (ref 70–99)

## 2014-08-06 MED ORDER — OXYCODONE-ACETAMINOPHEN 5-325 MG PO TABS
1.0000 | ORAL_TABLET | Freq: Four times a day (QID) | ORAL | Status: DC | PRN
  Administered 2014-08-07: 1 via ORAL
  Filled 2014-08-06 (×2): qty 1

## 2014-08-06 NOTE — Progress Notes (Signed)
Occupational Therapy Evaluation Patient Details Name: Dorothy FraiseCorine H Bartlett MRN: [id number] DOB: [birth year] Today's Date: 08/06/2014    History of Present Illness Pt s/p right femoral to peroneal bypass and right third toe amputation. PMH - CHF, MI, DM, LLE bypass   Clinical Impression   PTA, pt states she lived alone, was mod I with mobility and ADL and that her daughter assisted as needed. Pt currently requires Max A with LB ADL and mobility. Pt was able to stand today with mod A x 3 min, but unable to take steps due to c/o pain. Rec cast shoe to improve mobility. Will follow acutely to maximize functional level of independence and facilitate D/C to SNF for rehab.     Follow Up Recommendations  SNF;Supervision/Assistance - 24 hour    Equipment Recommendations  None recommended by OT    Recommendations for Other Services       Precautions / Restrictions Precautions Precautions: Fall Precaution Comments: no WBS noted Restrictions Weight Bearing Restrictions: No      Mobility Bed Mobility                  Transfers Overall transfer level: Needs assistance Equipment used: Rolling walker (2 wheeled) Transfers: Sit to/from Stand Sit to Stand: Mod assist         General transfer comment: Able to stand with mod A with increased time. ble to stand x 3 min but unable to take a step.    Balance Overall balance assessment: Needs assistance Sitting-balance support: Feet supported Sitting balance-Leahy Scale: Fair     Standing balance support: During functional activity Standing balance-Leahy Scale: Poor                              ADL Overall ADL's : Needs assistance/impaired     Grooming: Set up   Upper Body Bathing: Set up   Lower Body Bathing: Maximal assistance;Sit to/from stand   Upper Body Dressing : Minimal assistance;Sitting   Lower Body Dressing: Maximal assistance;Sit to/from stand   Toilet Transfer: Moderate assistance;RW (simulated with  recliner)   Toileting- Clothing Manipulation and Hygiene: Maximal assistance       Functional mobility during ADLs: Moderate assistance;Rolling walker (sit - stand only. Will need +2 for mobility) General ADL Comments: Pt limited by pain and generalized weakness.     Vision                     Perception     Praxis      Pertinent Vitals/Pain Pain Assessment: Faces Pain Score: 6  Pain Intervention(s): Limited activity within patient's tolerance;Repositioned     Hand Dominance Right   Extremity/Trunk Assessment Upper Extremity Assessment Upper Extremity Assessment: Generalized weakness   Lower Extremity Assessment Lower Extremity Assessment: Defer to PT evaluation RLE Deficits / Details: c/o pain with bending knee   Cervical / Trunk Assessment Cervical / Trunk Assessment: Kyphotic   Communication Communication Communication: HOH   Cognition Arousal/Alertness: Awake/alert Behavior During Therapy: WFL for tasks assessed/performed Overall Cognitive Status: No family/caregiver present to determine baseline cognitive functioning (Feel pt is at baseline)                     General Comments       Exercises Exercises: Other exercises Other Exercises Other Exercises: encouraged BUE AROM Other Exercises: encouraed foot pumps   Shoulder Instructions      Home Living Family/patient  expects to be discharged to:: Skilled nursing facility                                        Prior Functioning/Environment Level of Independence: Needs assistance        Comments: Pt states she was mod I with mobility and ADL and that daughter occasionally assisted with IADL tasks    OT Diagnosis: Generalized weakness;Acute pain   OT Problem List: Decreased strength;Decreased range of motion;Decreased activity tolerance;Impaired balance (sitting and/or standing);Decreased safety awareness;Decreased knowledge of use of DME or AE;Decreased knowledge of  precautions;Impaired sensation;Obesity;Pain   OT Treatment/Interventions: Self-care/ADL training;Therapeutic exercise;DME and/or AE instruction;Therapeutic activities;Patient/family education;Balance training    OT Goals(Current goals can be found in the care plan section) Acute Rehab OT Goals Patient Stated Goal: Return home OT Goal Formulation: With patient Time For Goal Achievement: 08/20/14 Potential to Achieve Goals: Good  OT Frequency: Min 2X/week   Barriers to D/C:            Co-evaluation              End of Session Equipment Utilized During Treatment: Gait belt;Rolling walker Nurse Communication: Mobility status;Patient requests pain meds;Other (comment) (toilating pt)  Activity Tolerance: Patient limited by pain Patient left: in chair;with call bell/phone within reach   Time: 1111-1137 OT Time Calculation (min): 26 min Charges:  OT General Charges $OT Visit: 1 Procedure OT Evaluation $Initial OT Evaluation Tier I: 1 Procedure OT Treatments $Self Care/Home Management : 8-22 mins G-Codes:    Brittany Amirault,HILLARY 08-30-2014, 11:51 AM   Luisa Dago, OTR/L  [phone] 2014-08-30

## 2014-08-06 NOTE — Progress Notes (Addendum)
  Progress Note    08/06/2014 7:41 AM 2 Days Post-Op  Subjective:  No complaints this am.  States she has not walked  Tm 99.5 now 99.3 110's-140's systolic HR 70's-90's 98% RA  Filed Vitals:   08/06/14 0504  BP: 144/56  Pulse: 92  Temp: 99.3 F (37.4 C)  Resp: 18    Physical Exam: Cardiac:  regular Lungs:  Non labored Incisions:  Mid thigh saphenous vein harvest site with bloody drainage-a few steri strips removed.  No active ooze from incision. Extremities:  2+ palpable graft pulse; + doppler signal right PT/AT  CBC    Component Value Date/Time   WBC 9.7 08/05/2014 1615   WBC 8.4 09/03/2008 1100   RBC 2.86* 08/05/2014 1615   RBC 3.67* 09/03/2008 1100   HGB 8.2* 08/05/2014 1615   HGB 11.3* 09/03/2008 1100   HCT 26.0* 08/05/2014 1615   HCT 34.3* 09/03/2008 1100   PLT 146* 08/05/2014 1615   PLT 146 09/03/2008 1100   MCV 90.9 08/05/2014 1615   MCV 93.5 09/03/2008 1100   MCH 28.7 08/05/2014 1615   MCH 30.9 09/03/2008 1100   MCHC 31.5 08/05/2014 1615   MCHC 33.0 09/03/2008 1100   RDW 15.0 08/05/2014 1615   RDW 13.2 09/03/2008 1100   LYMPHSABS 1.6 05/05/2014 1005   LYMPHSABS 2.4 09/03/2008 1100   MONOABS 0.7 05/05/2014 1005   MONOABS 0.6 09/03/2008 1100   EOSABS 0.1 05/05/2014 1005   EOSABS 0.1 09/03/2008 1100   BASOSABS 0.0 05/05/2014 1005   BASOSABS 0.0 09/03/2008 1100    BMET    Component Value Date/Time   NA 143 08/05/2014 0430   K 4.3 08/05/2014 0430   CL 106 08/05/2014 0430   CO2 20 08/05/2014 0430   GLUCOSE 142* 08/05/2014 0430   BUN 78* 08/05/2014 0430   CREATININE 3.16* 08/05/2014 1615   CALCIUM 8.7 08/05/2014 0430   GFRNONAA 12* 08/05/2014 1615   GFRAA 14* 08/05/2014 1615    INR    Component Value Date/Time   INR 0.97 02/22/2014 1620     Intake/Output Summary (Last 24 hours) at 08/06/14 0741 Last data filed at 08/05/14 1959  Gross per 24 hour  Intake    720 ml  Output    925 ml  Net   -205 ml     Assessment:  80 y.o. female is s/p:  Right femoral to peroneal bypass with in situ saphenous vein,  right third toe amputation  2 Days Post-Op  Plan: -doing well this am--graft is palpable -needs to increase mobilization.  PT recommends SNF.  Will get social work consult -DVT prophylaxis:  Lovenox -transfer to 2W later today -mid saphenous vein harvest site bloody dressing removed-no active oozing.  Will have RN replace steri strips that came off.   Doreatha MassedSamantha Rhyne, PA-C [facility] [phone] 08/06/2014 7:41 AM    I have examined the patient, reviewed and agree with above. Doing well overall. Still slow to move. Palpable subcutaneous right fem-fem to peroneal graft pulse  Charlie Seda, MD 08/06/2014 3:25 PM

## 2014-08-06 NOTE — Care Management Note (Signed)
    Page 1 of 1   08/10/2014     3:55:16 PM CARE MANAGEMENT NOTE 08/10/2014  Patient:  Dorothy FraiseDEESE,Lyniah H   Account Number:  [id number]  Date Initiated:  08/06/2014  Documentation initiated by:  Donn PieriniWEBSTER,KRISTI  Subjective/Objective Assessment:   Pt admitted s/p RIGHT FEMORAL-PERONEAL ARTERY BYPASS GRAFT (Right)  AMPUTATION DIGIT-RIGHT 3RD TOE     Action/Plan:   PTA pt lived at home- PT eval recommending SNF- CSW consulted   Anticipated DC Date:  08/09/2014   Anticipated DC Plan:  SKILLED NURSING FACILITY  In-house referral  Clinical Social Worker      DC Planning Services  CM consult      Choice offered to / List presented to:             Status of service:  Completed, signed off Medicare Important Message given?  YES (If response is "NO", the following Medicare IM given date fields will be blank) Date Medicare IM given:  08/06/2014 Medicare IM given by:  Donn PieriniWEBSTER,KRISTI Date Additional Medicare IM given:  08/09/2014 Additional Medicare IM given by:  Sobia Karger  Discharge Disposition:  SKILLED NURSING FACILITY  Per UR Regulation:  Reviewed for med. necessity/level of care/duration of stay  If discussed at Long Length of Stay Meetings, dates discussed:   08/10/2014    Comments:  08/10/14 Sidney AceJulie Dareld Mcauliffe, RN, BSN [phone] Pt dc'd to SNF today, per CSW arrangements.  08/09/14 Sidney AceJulie Shoshanna Mcquitty, RN, BSN [phone] CSW cont to follow for SNF placement for rehab when medically stable.  Pt receiving blood transfusion today.

## 2014-08-06 NOTE — Clinical Social Work Placement (Signed)
Clinical Social Work Department CLINICAL SOCIAL WORK PLACEMENT NOTE 08/06/2014  Patient:  Dorothy Bartlett,Dorothy Bartlett  Account Number:  [id number] Admit date:  08/03/2014  Clinical Social Worker:  Mosie EpsteinEMILY S Lameka Disla, LCSWA  Date/time:  08/06/2014 02:30 PM  Clinical Social Work is seeking post-discharge placement for this patient at the following level of care:   SKILLED NURSING   (*CSW will update this form in Epic as items are completed)   08/06/2014  [facility]family provided with [facility] of Clinical Social Work's list of facilities offering this level of care within the geographic area requested by the patient (or if unable, by the patient's family).  08/06/2014  Patient/family informed of their freedom to choose among providers that offer the needed level of care, that participate in Medicare, Medicaid or managed care program needed by the patient, have an available bed and are willing to accept the patient.  08/06/2014  Patient/family informed of [facility]' ownership interest in [facility], as well as of the fact that they are under no obligation to receive care at this facility.  PASARR submitted to EDS on  PASARR number received on   FL2 transmitted to all facilities in geographic area requested by pt/family on  08/06/2014 FL2 transmitted to all facilities within larger geographic area on   Patient informed that his/her managed care company has contracts with or will negotiate with  certain facilities, including the following:     Patient/family informed of bed offers received:   Patient chooses bed at  Physician recommends and patient chooses bed at    Patient to be transferred to  on   Patient to be transferred to facility by  Patient and family notified of transfer on  Name of family member notified:    The following physician request were entered in Epic:   Additional Comments: PASARR previously existing.  Marcelline DeistEmily Cullin Dishman, MSW, [facility] Licensed Clinical  Social Worker [phone] and [phone] [phone]

## 2014-08-06 NOTE — Progress Notes (Signed)
Report called to Fayrene FearingJames RN who will be taking pt. Pt transferred to 2W11. Family present and aware.

## 2014-08-06 NOTE — Clinical Social Work Psychosocial (Signed)
Clinical Social Work Department BRIEF PSYCHOSOCIAL ASSESSMENT 08/06/2014  Patient:  Dorothy Bartlett, Dorothy Bartlett     Account Number:  [id number]     Admit date:  08/03/2014  Clinical Social Worker:  Delrae Sawyers  Date/Time:  08/06/2014 02:13 PM  Referred by:  Physician  Date Referred:  08/06/2014 Referred for  SNF Placement   Other Referral:   none.   Interview type:  Patient Other interview type:   none.    PSYCHOSOCIAL DATA Living Status:  ALONE Admitted from facility:   Level of care:   Primary support name:  [name] Primary support relationship to patient:  CHILD, ADULT Degree of support available:   Strong support system. Pt reports having another daughter, Dorothy Bartlett, as well.    CURRENT CONCERNS Current Concerns  Post-Acute Placement   Other Concerns:   none.    SOCIAL WORK ASSESSMENT / PLAN CSW received consult for SNF placement at time of discharge. CSW met with pt at bedside to discuss discharge planning. Pt stated she was aware she was in need of rehabilitation at time of discharge, but was wanting to speak with her daughter Dorothy Bartlett) regarding discharge disposition. Pt stated she was agreeable to CSW completing a SNF search, but would be speaking with her daughter regarding final decision. CSW stated understanding.    CSW to continue to follow and assist with discharge planning needs.   Assessment/plan status:  Psychosocial Support/Ongoing Assessment of Needs Other assessment/ plan:   none.   Information/referral to community resources:   [facility].    PATIENT'S/FAMILY'S RESPONSE TO PLAN OF CARE: Pt understanding and agreeable to CSW plan of care. Pt stated she would be relying on her daughter Dorothy Bartlett) for final decision. Pt expressed no further questions or concerns at this time.       Lubertha Sayres, MSW, [facility] Licensed Clinical Social Worker [phone] and [phone]

## 2014-08-07 LAB — TYPE AND SCREEN
ABO/RH(D): AB NEG
ANTIBODY SCREEN: POSITIVE
DAT, IgG: NEGATIVE
UNIT DIVISION: 0
Unit division: 0

## 2014-08-07 LAB — GLUCOSE, CAPILLARY
Glucose-Capillary: 150 mg/dL — ABNORMAL HIGH (ref 70–99)
Glucose-Capillary: 115 mg/dL — ABNORMAL HIGH (ref 70–99)
Glucose-Capillary: 212 mg/dL — ABNORMAL HIGH (ref 70–99)
Glucose-Capillary: 128 mg/dL — ABNORMAL HIGH (ref 70–99)

## 2014-08-07 NOTE — Progress Notes (Addendum)
  Progress Note    08/07/2014 9:39 AM 3 Days Post-Op  Subjective:  Sleeping-wakes to voice.  No complaints  Afebrile HR 60's-80's 100's-140's systolic 97% RA  Filed Vitals:   08/07/14 0447  BP: 142/61  Pulse: 77  Temp: 98.3 F (36.8 C)  Resp: 18    Physical Exam: Lungs:  CTAB Incisions:  C/d/i with no further bloody drainage; steri strips are in tact. Extremities:  + palpable graft pulse  CBC    Component Value Date/Time   WBC 9.7 08/05/2014 1615   WBC 8.4 09/03/2008 1100   RBC 2.86* 08/05/2014 1615   RBC 3.67* 09/03/2008 1100   HGB 8.2* 08/05/2014 1615   HGB 11.3* 09/03/2008 1100   HCT 26.0* 08/05/2014 1615   HCT 34.3* 09/03/2008 1100   PLT 146* 08/05/2014 1615   PLT 146 09/03/2008 1100   MCV 90.9 08/05/2014 1615   MCV 93.5 09/03/2008 1100   MCH 28.7 08/05/2014 1615   MCH 30.9 09/03/2008 1100   MCHC 31.5 08/05/2014 1615   MCHC 33.0 09/03/2008 1100   RDW 15.0 08/05/2014 1615   RDW 13.2 09/03/2008 1100   LYMPHSABS 1.6 05/05/2014 1005   LYMPHSABS 2.4 09/03/2008 1100   MONOABS 0.7 05/05/2014 1005   MONOABS 0.6 09/03/2008 1100   EOSABS 0.1 05/05/2014 1005   EOSABS 0.1 09/03/2008 1100   BASOSABS 0.0 05/05/2014 1005   BASOSABS 0.0 09/03/2008 1100    BMET    Component Value Date/Time   NA 143 08/05/2014 0430   K 4.3 08/05/2014 0430   CL 106 08/05/2014 0430   CO2 20 08/05/2014 0430   GLUCOSE 142* 08/05/2014 0430   BUN 78* 08/05/2014 0430   CREATININE 3.16* 08/05/2014 1615   CALCIUM 8.7 08/05/2014 0430   GFRNONAA 12* 08/05/2014 1615   GFRAA 14* 08/05/2014 1615    INR    Component Value Date/Time   INR 0.97 02/22/2014 1620     Intake/Output Summary (Last 24 hours) at 08/07/14 [id number] Last data filed at 08/06/14 1518  Gross per 24 hour  Intake     50 ml  Output    100 ml  Net    -50 ml     Assessment:  80 y.o. female is s/p:  Right femoral to peroneal bypass with in situ saphenous vein, right third toe amputation   3 Days Post-Op  Plan: -pt doing well this morning-she does have a palpable graft pulse -DVT  prophylaxis:  Lovenox -continue to work with PT and increase mobilization -incisions with no further bloody drainage and are healing  -social work working on placement -check labs in am   Doreatha MassedSamantha Rhyne, PA-C [facility] [phone] 08/07/2014 9:39 AM Bypass patent with palpable graft pulse the incision is healing satisfactorily Continued to mobilize patient and arrange for placement early next week

## 2014-08-08 LAB — BASIC METABOLIC PANEL
Anion gap: 14 (ref 5–15)
BUN: 84 mg/dL — ABNORMAL HIGH (ref 6–23)
CHLORIDE: 100 meq/L (ref 96–112)
CO2: 22 meq/L (ref 19–32)
CREATININE: 3.98 mg/dL — AB (ref 0.50–1.10)
Calcium: 8.9 mg/dL (ref 8.4–10.5)
GFR calc Af Amer: 11 mL/min — ABNORMAL LOW (ref >90)
GFR calc non Af Amer: 9 mL/min — ABNORMAL LOW (ref >90)
GLUCOSE: 100 mg/dL — AB (ref 70–99)
Potassium: 4 mEq/L (ref 3.7–5.3)
Sodium: 136 mEq/L — ABNORMAL LOW (ref 137–147)

## 2014-08-08 LAB — CBC
HCT: 25.3 % — ABNORMAL LOW (ref 36.0–46.0)
HEMOGLOBIN: 8 g/dL — AB (ref 12.0–15.0)
MCH: 29.2 pg (ref 26.0–34.0)
MCHC: 31.6 g/dL (ref 30.0–36.0)
MCV: 92.3 fL (ref 78.0–100.0)
Platelets: 128 10*3/uL — ABNORMAL LOW (ref 150–400)
RBC: 2.74 MIL/uL — ABNORMAL LOW (ref 3.87–5.11)
RDW: 15 % (ref 11.5–15.5)
WBC: 8.2 10*3/uL (ref 4.0–10.5)

## 2014-08-08 LAB — GLUCOSE, CAPILLARY
GLUCOSE-CAPILLARY: 100 mg/dL — AB (ref 70–99)
GLUCOSE-CAPILLARY: 221 mg/dL — AB (ref 70–99)
Glucose-Capillary: 163 mg/dL — ABNORMAL HIGH (ref 70–99)
Glucose-Capillary: 126 mg/dL — ABNORMAL HIGH (ref 70–99)

## 2014-08-08 NOTE — Progress Notes (Addendum)
  Progress Note    08/08/2014 8:55 AM 4 Days Post-Op  Subjective:  No complaints--states she has not walked.  She has been up in the chair  Afebrile VSS 100% RA   Filed Vitals:   08/08/14 0616  BP: 127/52  Pulse: 67  Temp: 98 F (36.7 C)  Resp: 17    Physical Exam: Lungs:  Non labored Incisions:  Healing nicely with steri strips in place Extremities:  + palpable graft pulse  CBC    Component Value Date/Time   WBC 8.2 08/08/2014 0400   WBC 8.4 09/03/2008 1100   RBC 2.74* 08/08/2014 0400   RBC 3.67* 09/03/2008 1100   HGB 8.0* 08/08/2014 0400   HGB 11.3* 09/03/2008 1100   HCT 25.3* 08/08/2014 0400   HCT 34.3* 09/03/2008 1100   PLT 128* 08/08/2014 0400   PLT 146 09/03/2008 1100   MCV 92.3 08/08/2014 0400   MCV 93.5 09/03/2008 1100   MCH 29.2 08/08/2014 0400   MCH 30.9 09/03/2008 1100   MCHC 31.6 08/08/2014 0400   MCHC 33.0 09/03/2008 1100   RDW 15.0 08/08/2014 0400   RDW 13.2 09/03/2008 1100   LYMPHSABS 1.6 05/05/2014 1005   LYMPHSABS 2.4 09/03/2008 1100   MONOABS 0.7 05/05/2014 1005   MONOABS 0.6 09/03/2008 1100   EOSABS 0.1 05/05/2014 1005   EOSABS 0.1 09/03/2008 1100   BASOSABS 0.0 05/05/2014 1005   BASOSABS 0.0 09/03/2008 1100    BMET    Component Value Date/Time   NA 136* 08/08/2014 0400   K 4.0 08/08/2014 0400   CL 100 08/08/2014 0400   CO2 22 08/08/2014 0400   GLUCOSE 100* 08/08/2014 0400   BUN 84* 08/08/2014 0400   CREATININE 3.98* 08/08/2014 0400   CALCIUM 8.9 08/08/2014 0400   GFRNONAA 9* 08/08/2014 0400   GFRAA 11* 08/08/2014 0400    INR    Component Value Date/Time   INR 0.97 02/22/2014 1620     Intake/Output Summary (Last 24 hours) at 08/08/14 0855 Last data filed at 08/08/14 0800  Gross per 24 hour  Intake    440 ml  Output    500 ml  Net    -60 ml     Assessment:  80 y.o. female is s/p:  Right femoral to peroneal bypass with in situ saphenous vein, right third toe amputation  4 Days Post-Op  Plan: -pt doing well--  + palpable graft pulse -pt needs to be out of bed and and  ambulating -pt's creatinine is increased at 3.9 today, but was 3.7 pre op and was 3.9 back in April. -social work is working on placement -DVT prophylaxis:  Lovenox   Doreatha MassedSamantha Rhyne, PA-C [facility] [phone] 08/08/2014 8:55 AM Right leg bypass working well with 3+ pulse. Patient has not ambulated. Will continue to work on ambulation and patient placement next week

## 2014-08-08 NOTE — Progress Notes (Signed)
Clinical Child psychotherapistocial Worker (CSW) gave patient bed offers. Granddaughter Gloris Manchesterraci was at bedside and reported that she would look over offers. CSW will continue to follow and assist as needed.   Jetta LoutBailey Morgan, LCSWA Weekend CSW [phone]

## 2014-08-09 LAB — HEMOGLOBIN AND HEMATOCRIT, BLOOD
HCT: 24.5 % — ABNORMAL LOW (ref 36.0–46.0)
Hemoglobin: 7.8 g/dL — ABNORMAL LOW (ref 12.0–15.0)

## 2014-08-09 LAB — GLUCOSE, CAPILLARY
GLUCOSE-CAPILLARY: 109 mg/dL — AB (ref 70–99)
GLUCOSE-CAPILLARY: 262 mg/dL — AB (ref 70–99)
GLUCOSE-CAPILLARY: 83 mg/dL (ref 70–99)
Glucose-Capillary: 155 mg/dL — ABNORMAL HIGH (ref 70–99)

## 2014-08-09 LAB — PREPARE RBC (CROSSMATCH)

## 2014-08-09 MED ORDER — SODIUM CHLORIDE 0.9 % IV SOLN
Freq: Once | INTRAVENOUS | Status: AC
  Administered 2014-08-09: 13:00:00 via INTRAVENOUS

## 2014-08-09 NOTE — Progress Notes (Addendum)
     Subjective  - Rested comfortable last night.  No new complaints.     Objective 136/52 73 98.6 F (37 C) (Oral) 18 98%  Intake/Output Summary (Last 24 hours) at 08/09/14 0719 Last data filed at 08/09/14 0302  Gross per 24 hour  Intake    320 ml  Output   1000 ml  Net   -680 ml    Right LE incision C/D/I steri strips in place. Palpable graft pulse, foot warm to touch Amputation 3 erd toe site healing well, no drainage.   Assessment/Planning: POD #5 Right femoral to peroneal bypass with in situ saphenous vein, right third toe amputation  Pending SNF CR 3.9  DVT prophylaxis: Lovenox Hemoglobin 8.0 will check H/H today  Thomasena EdisCOLLINS, EMMA [facility] 08/09/2014 7:19 AM --  Laboratory Lab Results:  Recent Labs  08/08/14 0400  WBC 8.2  HGB 8.0*  HCT 25.3*  PLT 128*   BMET  Recent Labs  08/08/14 0400  NA 136*  K 4.0  CL 100  CO2 22  GLUCOSE 100*  BUN 84*  CREATININE 3.98*  CALCIUM 8.9    COAG Lab Results  Component Value Date   INR 0.97 02/22/2014   INR 0.92 01/07/2014   No results found for this basename: PTT      I have examined the patient, reviewed and agree with above. Easily palpable graft pulse. 13 amputation site healing. Hemoglobin down to 7.4. We'll transfuse. No evidence of ongoing bleeding. Her presents for blood loss perioperatively. Be stable for skilled nursing facility placement tomorrow if bed available  EARLY, TODD, MD 08/09/2014 4:45 PM

## 2014-08-09 NOTE — Progress Notes (Signed)
Physical Therapy Treatment Patient Details Name: Dorothy Bartlett MRN: [id number] DOB: 1926/09/18 Today's Date: 08/09/2014    History of Present Illness Pt s/p right femoral to peroneal bypass and right third toe amputation. PMH - CHF, MI, DM, LLE bypass    PT Comments    Pt making good progress with mobility. Able to amb in hallway today with assistance.  Follow Up Recommendations  SNF     Equipment Recommendations  None recommended by PT    Recommendations for Other Services       Precautions / Restrictions Precautions Precautions: Fall Restrictions Weight Bearing Restrictions: No    Mobility  Bed Mobility Overal bed mobility: Needs Assistance Bed Mobility: Supine to Sit     Supine to sit: Min assist;HOB elevated     General bed mobility comments: Assist to bring rt leg off bed and to elevate trunk.  Transfers Overall transfer level: Needs assistance Equipment used: Rolling walker (2 wheeled) Transfers: Sit to/from [facility] Sit to Stand: +2 physical assistance;Min assist Stand pivot transfers: +2 physical assistance;Min assist       General transfer comment: verbal cues for hand placement and assist to bring hips up.  Ambulation/Gait Ambulation/Gait assistance: +2 physical assistance;Min assist Ambulation Distance (Feet): 60 Feet Assistive device: Rolling walker (2 wheeled) Gait Pattern/deviations: Step-to pattern;Decreased step length - right;Decreased step length - left;Decreased stance time - right;Trunk flexed Gait velocity: slow Gait velocity interpretation: Below normal speed for age/gender General Gait Details: Verbal cues to stand more erect.   Stairs            Wheelchair Mobility    Modified Rankin (Stroke Patients Only)       Balance Overall balance assessment: Needs assistance Sitting-balance support: No upper extremity supported;Feet supported Sitting balance-Leahy Scale: Fair     Standing balance support:  Bilateral upper extremity supported Standing balance-Leahy Scale: Poor Standing balance comment: stands with support of walker and assist.                    Cognition Arousal/Alertness: Awake/alert Behavior During Therapy: WFL for tasks assessed/performed Overall Cognitive Status: Within Functional Limits for tasks assessed                      Exercises      General Comments        Pertinent Vitals/Pain Pain Assessment: Faces Faces Pain Scale: Hurts a little bit Pain Location: rt groin incision Pain Descriptors / Indicators: Sore Pain Intervention(s): Limited activity within patient's tolerance;Repositioned    Home Living                      Prior Function            PT Goals (current goals can now be found in the care plan section) Acute Rehab PT Goals Patient Stated Goal: Return home PT Goal Formulation: With patient Time For Goal Achievement: 08/16/14 Potential to Achieve Goals: Good Progress towards PT goals: Goals met and updated - see care plan    Frequency  Min 3X/week    PT Plan Current plan remains appropriate    Co-evaluation             End of Session Equipment Utilized During Treatment: Gait belt Activity Tolerance: Patient tolerated treatment well Patient left: in chair;with call bell/phone within reach     Time: 1031-1101 PT Time Calculation (min): 30 min  Charges:  $Gait Training: 23-37 mins  G Codes:      Dorothy Bartlett 08/09/2014, 11:30 AM  Dorothy Bartlett PT [phone]

## 2014-08-10 ENCOUNTER — Telehealth: Payer: Self-pay | Admitting: Vascular Surgery

## 2014-08-10 LAB — TYPE AND SCREEN
ABO/RH(D): AB NEG
Antibody Screen: POSITIVE
DAT, IGG: NEGATIVE
UNIT DIVISION: 0
UNIT DIVISION: 0

## 2014-08-10 LAB — GLUCOSE, CAPILLARY
GLUCOSE-CAPILLARY: 103 mg/dL — AB (ref 70–99)
GLUCOSE-CAPILLARY: 188 mg/dL — AB (ref 70–99)

## 2014-08-10 MED ORDER — OXYCODONE HCL 5 MG PO TABS: 5.0000 mg | ORAL_TABLET | Freq: Four times a day (QID) | ORAL | Status: DC | PRN

## 2014-08-10 NOTE — Progress Notes (Addendum)
  Progress Note    08/10/2014 7:29 AM 6 Days Post-Op  Subjective:  No complaints-states she walked in halls yesterday  Afebrile VSS  Filed Vitals:   08/10/14 0405  BP: 137/56  Pulse: 62  Temp: 97.9 F (36.6 C)  Resp: 17    Physical Exam: Lungs:  Non labored Incisions:  Healing nicely with steri strips in tact Extremities:  Right foot is warm; + right DP/PT doppler pulse  CBC    Component Value Date/Time   WBC 8.2 08/08/2014 0400   WBC 8.4 09/03/2008 1100   RBC 2.74* 08/08/2014 0400   RBC 3.67* 09/03/2008 1100   HGB 7.8* 08/09/2014 0845   HGB 11.3* 09/03/2008 1100   HCT 24.5* 08/09/2014 0845   HCT 34.3* 09/03/2008 1100   PLT 128* 08/08/2014 0400   PLT 146 09/03/2008 1100   MCV 92.3 08/08/2014 0400   MCV 93.5 09/03/2008 1100   MCH 29.2 08/08/2014 0400   MCH 30.9 09/03/2008 1100   MCHC 31.6 08/08/2014 0400   MCHC 33.0 09/03/2008 1100   RDW 15.0 08/08/2014 0400   RDW 13.2 09/03/2008 1100   LYMPHSABS 1.6 05/05/2014 1005   LYMPHSABS 2.4 09/03/2008 1100   MONOABS 0.7 05/05/2014 1005   MONOABS 0.6 09/03/2008 1100   EOSABS 0.1 05/05/2014 1005   EOSABS 0.1 09/03/2008 1100   BASOSABS 0.0 05/05/2014 1005   BASOSABS 0.0 09/03/2008 1100    BMET    Component Value Date/Time   NA 136* 08/08/2014 0400   K 4.0 08/08/2014 0400   CL 100 08/08/2014 0400   CO2 22 08/08/2014 0400   GLUCOSE 100* 08/08/2014 0400   BUN 84* 08/08/2014 0400   CREATININE 3.98* 08/08/2014 0400   CALCIUM 8.9 08/08/2014 0400   GFRNONAA 9* 08/08/2014 0400   GFRAA 11* 08/08/2014 0400    INR    Component Value Date/Time   INR 0.97 02/22/2014 1620     Intake/Output Summary (Last 24 hours) at 08/10/14 0729 Last data filed at 08/10/14 0700  Gross per 24 hour  Intake   1051 ml  Output   1200 ml  Net   -149 ml     Assessment:  80 y.o. female is s/p:  Right femoral to peroneal bypass with in situ saphenous vein, right third toe amputation   6 Days Post-Op  Plan: -pt doing well this am with good doppler signal right PT/DP -DVT prophylaxis:   Lovenox -hgb down slightly yesterday at 7.8 from 8.0 and 8.2 the day before.  Pt's BP is tolerating.  Given her age and mildly reduced EF, may want to transfuse one unit--will d/w Dr. Arbie CookeyEarly -social work working on placement -continue to increase mobilization   Doreatha MassedSamantha Rhyne, [place] JerseyPA-C [facility] [phone] 08/10/2014 7:29 AM    I have examined the patient, reviewed and agree with above. Stable. Received 1 unit packed cells yesterday. Okay for discharge from surgical standpoint to skilled nursing facility when bed available. 2-3+ palpable graft pulse  Iley Deignan, MD 08/10/2014 8:39 AM

## 2014-08-10 NOTE — Clinical Social Work Placement (Signed)
Clinical Social Work Department CLINICAL SOCIAL WORK PLACEMENT NOTE 08/10/2014  Patient:  Dorothy FraiseDEESE,[name]  Account Number:  [id number] Admit date:  08/03/2014  Clinical Social Worker:  Mosie EpsteinEMILY S VAUGHN, LCSWA  Date/time:  08/06/2014 02:30 PM  Clinical Social Work is seeking post-discharge placement for this patient at the following level of care:   SKILLED NURSING   (*CSW will update this form in Epic as items are completed)   08/06/2014  [facility]/family provided with [facility] Department of Clinical Social Work's list of facilities offering this level of care within the geographic area requested by the patient (or if unable, by the patient's family).  08/06/2014  Patient/family informed of their freedom to choose among providers that offer the needed level of care, that participate in Medicare, Medicaid or managed care program needed by the patient, have an available bed and are willing to accept the patient.  08/06/2014  Patient/family informed of [facility]' ownership interest in [facility], as well as of the fact that they are under no obligation to receive care at this facility.  PASARR submitted to EDS on  PASARR number received on   FL2 transmitted to all facilities in geographic area requested by pt/family on  08/06/2014 FL2 transmitted to all facilities within larger geographic area on   Patient informed that his/her managed care company has contracts with or will negotiate with  certain facilities, including the following:     Patient/family informed of bed offers received:  08/09/2014 [facility] Physician recommends and patient chooses bed at    [facility]  08/10/2014 Patient to be transferred to facility by Daughter will transport Patient and family notified of transfer on 08/10/2014 Name of family member notified:  Harriett SineNancy  The following physician request were entered in Epic:   Additional  Comments: Per MD [facility] RN, patient, patient's family, and facility notified of DC. RN given number for report. DC packet on chart. Daughter will transport. CSW signing off.

## 2014-08-10 NOTE — Discharge Summary (Signed)
Discharge Summary     Dorothy Bartlett [birth year] 80 y.o. female  [id number]  Admission Date: 08/03/2014  Discharge Date: 08/10/14  Physician: Larina Earthly, MD  Admission Diagnosis: pvd Peripheral Vascular Disease   HPI:   This is a 80 y.o. female Patient presents today for evaluation of her right foot. She is well known to me from a prior left femoral to posterior tibial in situ bypass for critical limb ischemia. She had intolerable rest pain and underwent left leg bypass in February 2015. She has severe cardiac disease and also renal insufficiency. She is at extremely high risk of despite this did very well with her femoral-tibial bypass. She presents today now for difficulty in the contralateral leg. She has gangrenous changes of the tip of her right third toe. Sugar daughter reports is been present for approximately 5 weeks and has been slightly progressive. She has severe pain associated with this. She is being seen at the [facility]. She does have severe cardiac disease but no recent exacerbations. She does see Dr. Katrinka Blazing later this week  Hospital Course:  The pt was taken to the PV lab and underwent  1. ultrasound-guided access, right femoral artery  2. abdominal aortogram with CO2  3. right lower extremity runoff With the following impression: #1 distal superficial femoral artery disease with popliteal occlusion on the right. There is reconstitution of the peroneal artery proximally and the anterior tibial artery distally.  #2 a total of 47 mL of contrast was  The patient was admitted to the hospital and taken to the operating room on 08/03/2014 - 08/04/2014 and underwent: Right femoral to peroneal bypass with in situ saphenous vein, right third toe amputation    The pt tolerated the procedure well and was transported to the PACU in good condition. By POD 1, she did have a palpable graft pulse.    ABI's performed on POD 2 are as follows:  RIGHT    LEFT     PRESSURE  WAVEFORM   PRESSURE  WAVEFORM   BRACHIAL  113  Triphasic  BRACHIAL  122  Triphasic   DP  80  Dampened monophasic  DP   Absent   AT    AT     PT  76  Severely dampened monophasic  PT  Noncompressible  Dampened monophasic   PER  97  Dampened monophasic  PER     GREAT TOE   NA  GREAT TOE   NA     RIGHT  LEFT   ABI  0.80  Noncompressible    On POD 2, she was transferred to the telemetry floor.  She did have some bloody ooze from a saphenous vein harvest site, but this did improve.  She did have an acute blood loss anemia and was transfused 2 units of PRBC's.  The remainder of the hospital course consisted of increasing mobilization and increasing intake of solids without difficulty.  CBC    Component Value Date/Time   WBC 8.2 08/08/2014 0400   WBC 8.4 09/03/2008 1100   RBC 2.74* 08/08/2014 0400   RBC 3.67* 09/03/2008 1100   HGB 7.8* 08/09/2014 0845   HGB 11.3* 09/03/2008 1100   HCT 24.5* 08/09/2014 0845   HCT 34.3* 09/03/2008 1100   PLT 128* 08/08/2014 0400   PLT 146 09/03/2008 1100   MCV 92.3 08/08/2014 0400   MCV 93.5 09/03/2008 1100   MCH 29.2 08/08/2014 0400   MCH 30.9 09/03/2008 1100   MCHC  31.6 08/08/2014 0400   MCHC 33.0 09/03/2008 1100   RDW 15.0 08/08/2014 0400   RDW 13.2 09/03/2008 1100   LYMPHSABS 1.6 05/05/2014 1005   LYMPHSABS 2.4 09/03/2008 1100   MONOABS 0.7 05/05/2014 1005   MONOABS 0.6 09/03/2008 1100   EOSABS 0.1 05/05/2014 1005   EOSABS 0.1 09/03/2008 1100   BASOSABS 0.0 05/05/2014 1005   BASOSABS 0.0 09/03/2008 1100    BMET    Component Value Date/Time   NA 136* 08/08/2014 0400   K 4.0 08/08/2014 0400   CL 100 08/08/2014 0400   CO2 22 08/08/2014 0400   GLUCOSE 100* 08/08/2014 0400   BUN 84* 08/08/2014 0400   CREATININE 3.98* 08/08/2014 0400   CALCIUM 8.9 08/08/2014 0400   GFRNONAA 9* 08/08/2014 0400   GFRAA 11* 08/08/2014 0400     Discharge Instructions:   The patient is discharged with extensive instructions on wound care and progressive ambulation.  They are instructed not to drive or perform  any heavy lifting until returning to see the physician in his office.  Discharge Instructions   Call MD for:  redness, tenderness, or signs of infection (pain, swelling, bleeding, redness, odor or green/yellow discharge around incision site)    Complete by:  As directed      Call MD for:  severe or increased pain, loss or decreased feeling  in affected limb(s)    Complete by:  As directed      Call MD for:  temperature >100.5    Complete by:  As directed      Discharge wound care:    Complete by:  As directed   Wash the groin wound with soap and water daily and pat dry. (No tub bath-only shower)  Then put a dry gauze or washcloth there to keep this area dry daily and as needed.  Do not use Vaseline or neosporin on your incisions.  Only use soap and water on your incisions and then protect and keep dry.     Driving Restrictions    Complete by:  As directed   No driving for 2 weeks     Lifting restrictions    Complete by:  As directed   No lifting for 4 weeks     Resume previous diet    Complete by:  As directed            Discharge Diagnosis:  pvd Peripheral Vascular Disease  Secondary Diagnosis: Patient Active Problem List   Diagnosis Date Noted  . Foot ulcer due to secondary DM 08/03/2014  . PAD (peripheral artery disease) 08/03/2014  . Pronation deformity of ankle, acquired 06/02/2014  . Onychomycosis 05/12/2014  . Chronic combined systolic and diastolic heart failure 04/13/2014  . Non-ST elevation myocardial infarction (NSTEMI), initial episode of care 04/13/2014  . PVD (peripheral vascular disease) 02/16/2014  . CKD (chronic kidney disease) stage 4, GFR 15-29 ml/min 01/15/2014  . Edema of left foot 12/30/2013  . INTRINSIC ASTHMA, UNSPECIFIED 01/28/2008  . HYPERTENSION 01/13/2008  . DIABETES, TYPE 2 01/12/2008  . HYPERLIPIDEMIA 01/12/2008  . CORONARY HEART DISEASE 01/12/2008  . DYSPNEA 01/12/2008   Past Medical History  Diagnosis Date  . MI (myocardial infarction)  1995  . Asthma   . Hx of echocardiogram   . Chronic diastolic CHF (congestive heart failure)     a. Echocardiogram (04/2005):EF 55-65%, moderate LVH, mild aortic stenosis (mean gradient 9), moderate LAE.  [name]. Hyperlipidemia   . PAD (peripheral artery disease)   . Carotid stenosis  Carotid US (12/2013): < 60% RCA, < 40% LICA.  [name] Type II diabetes mellitus   . Arthritis   . PVD (peripheral vascular disease)   . CKD (chronic kidney disease), stage IV     notes 02/22/2014; Dr. Hyman Hopes  . Hypertension   . Coronary artery disease     a. s/p MI 1995 tx w/ PTCA to Dx, Ramus, ap LAD;  b. LHC (11/2004):  Inferior HK, EF 60%, proximal LAD 50%, ostial D1 80%, mid D1 95%, mid LAD 50-60%, distal LAD at the apex 90%, OM1 occluded (fills late by left to left collaterals), RCA 75%. Medical therapy recommended.;  c. Lexiscan Myoview (12/2013):  Prior ant-lat scar with very small area of peri-infarct ischemia in ant wall, EF 40 (high risk)        Medication List         albuterol 108 (90 BASE) MCG/ACT inhaler  Commonly known as:  PROVENTIL HFA;VENTOLIN HFA  Inhale 2 puffs into the lungs every 6 (six) hours as needed for wheezing or shortness of breath.     aspirin 81 MG tablet  Take 81 mg by mouth every morning.     atorvastatin 40 MG tablet  Commonly known as:  LIPITOR  Take 40 mg by mouth every morning.     carvedilol 12.5 MG tablet  Commonly known as:  COREG  Take 1 tablet (12.5 mg total) by mouth 2 (two) times daily.     docusate sodium 100 MG capsule  Commonly known as:  COLACE  Take 100 mg by mouth at bedtime.     doxycycline 100 MG DR capsule  Commonly known as:  DORYX  Take 100 mg by mouth 2 (two) times daily. Starting 7/17 for 10 days     furosemide 40 MG tablet  Commonly known as:  LASIX  Take 1 tablet (40 mg total) by mouth daily.     hydrALAZINE 10 MG tablet  Commonly known as:  APRESOLINE  Take 10 mg by mouth 3 (three) times daily.     insulin lispro 100 UNIT/ML injection    Commonly known as:  HUMALOG  Inject 5 Units into the skin 2 (two) times daily as needed for high blood sugar. Only takes if CBG >250.     isosorbide mononitrate 30 MG 24 hr tablet  Commonly known as:  IMDUR  Take 1 tablet (30 mg total) by mouth daily.     Melatonin 3 MG Tabs  Take 3 mg by mouth at bedtime as needed (for sleep).     ONETOUCH DELICA LANCETS 33G Misc     ONETOUCH VERIO test strip  Generic drug:  glucose blood     oxyCODONE 5 MG immediate release tablet  Commonly known as:  ROXICODONE  Take 1 tablet (5 mg total) by mouth every 6 (six) hours as needed for severe pain.     paricalcitol 1 MCG capsule  Commonly known as:  ZEMPLAR  Take 1 mcg by mouth daily.     pregabalin 50 MG capsule  Commonly known as:  LYRICA  Take 50 mg by mouth daily.     RENA-VITE RX 1 MG Tabs  Take 1 tablet by mouth every morning.     sitaGLIPtin 25 MG tablet  Commonly known as:  JANUVIA  Take 25 mg by mouth daily.        Roxicodone #30 No Refill  Disposition: SNF  Patient's condition: is Good  Follow up: 1. Dr. Arbie Cookey in 2 weeks  Doreatha Massed, PA-C [facility] [phone] 08/10/2014  8:45 AM  - For VQI Registry use --- Instructions: Press F2 to tab through selections.  Delete question if not applicable.   Post-op:  Wound infection: No  Graft infection: No  Transfusion: Yes  If yes, 2 units given New Arrhythmia: No Ipsilateral amputation: No, [ ]  Minor, [ ]  BKA, [ ]  AKA Discharge patency: [x ] Primary, [ ]  Primary assisted, [ ]  Secondary, [ ]  Occluded Patency judged by: [ ]  Dopper only, [ x] Palpable graft pulse, [ ]  Palpable distal pulse, [ ]  ABI inc. > 0.15, [ ]  Duplex Discharge ABI: R 0.80, L non compressible Discharge TBI: R , L  D/C Ambulatory Status: Ambulatory with Assistance  Complications: MI: No, [ ]  Troponin only, [ ]  EKG or Clinical CHF: No Resp failure:No, [ ]  Pneumonia, [ ]  Ventilator Chg in renal function: No, [ ]  Inc. Cr >  0.5, [ ]  Temp. Dialysis, [ ]  Permanent dialysis Stroke: No, [ ]  Minor, [ ]  Major Return to OR: No  Reason for return to OR: [ ]  Bleeding, [ ]  Infection, [ ]  Thrombosis, [ ]  Revision  Discharge medications: Statin use:  yes ASA use:  yes Plavix use:  no Beta blocker use: yes Coumadin use: no

## 2014-08-10 NOTE — Progress Notes (Signed)
[facility] .

## 2014-08-10 NOTE — Progress Notes (Signed)
Occupational Therapy Treatment Patient Details Name: Claretta FraiseCorine H Surgeon MRN: [id number] DOB: 07-09-1926 Today's Date: 08/10/2014    History of present illness Pt s/p right femoral to peroneal bypass and right third toe amputation. PMH - CHF, MI, DM, LLE bypass   OT comments  Pt improving but still demonstrates the need for min to mod assist for sit to stand and toilet transfers.  She needed mod instructional cueing for sequencing the RW and steps.  Noted short step length as well with increased trunk flexion present.  Recommend continued rehab in acute setting to reach min assist level goals.  SNF still recommended for follow-up.  Follow Up Recommendations  SNF;Supervision/Assistance - 24 hour    Equipment Recommendations  None recommended by OT       Precautions / Restrictions Precautions Precautions: Fall Precaution Comments: no WBS noted Restrictions Weight Bearing Restrictions: No       Mobility Bed Mobility                  Transfers Overall transfer level: Needs assistance Equipment used: Rolling walker (2 wheeled) Transfers: Sit to/from Stand Sit to Stand: Mod assist Stand pivot transfers: Mod assist       General transfer comment: Pt needed mod assist for sit to stand from bedside chair initially but progressed to min assist from 3:1.    Balance Overall balance assessment: Needs assistance   Sitting balance-Leahy Scale: Fair     Standing balance support: Bilateral upper extremity supported Standing balance-Leahy Scale: Poor Standing balance comment: Pt with initial LOB posteriorly in standing requiring mod assist to self correct.                   ADL Overall ADL's : Needs assistance/impaired     Grooming: Wash/dry hands;Wash/dry face;Oral care;Minimal assistance;Sitting;Standing Grooming Details (indicate cue type and reason): Pt stood to brush her teeth but needed to sit to wash her hands and face.                 Toilet Transfer:  Moderate assistance;RW;BSC;Ambulation   Toileting- Clothing Manipulation and Hygiene: Moderate assistance;Sit to/from stand       Functional mobility during ADLs: Moderate assistance;Rolling walker;Cueing for sequencing General ADL Comments: Pt needed mod instructional cueing for correct position of walker and sequencing for stepping.  Pt demonstrates flexed trunk and currently takes small steps.  At times she attempts to push the walker too far out in front of her.                 Cognition   Behavior During Therapy: WFL for tasks assessed/performed Overall Cognitive Status: Within Functional Limits for tasks assessed                                    Pertinent Vitals/ Pain       Pain Assessment: No/denies pain         Frequency Min 2X/week     Progress Toward Goals  OT Goals(current goals can now be found in the care plan section)  Progress towards OT goals: Progressing toward goals     Plan Discharge plan remains appropriate       End of Session Equipment Utilized During Treatment: Gait belt;Rolling walker   Activity Tolerance Patient limited by fatigue   Patient Left in chair;with call bell/phone within reach   Nurse Communication Mobility status  Time: 4098-1191 OT Time Calculation (min): 30 min  Charges: OT General Charges $OT Visit: 1 Procedure OT Treatments $Self Care/Home Management : 23-37 mins  Davionne Mastrangelo 08/10/2014, 10:08 AM OTR/L

## 2014-08-10 NOTE — Telephone Encounter (Signed)
Spoke with Harriett SineNancy, pts daughter to set up appt, dpm

## 2014-08-10 NOTE — Telephone Encounter (Signed)
Message copied by Fredrich BirksMILLIKAN, DANA P on Tue Aug 10, 2014  2:18 PM ------      Message from: Sharee PimpleMCCHESNEY, MARILYN K      Created: Tue Aug 10, 2014  8:55 AM      Regarding: Schedule                   ----- Message -----         From: Dara LordsSamantha J Rhyne, PA-C         Sent: 08/10/2014   8:45 AM           To: [facility] Charge Pool            S/p Right femoral to peroneal bypass with in situ saphenous vein, right third toe amputation               F/u with Dr. Arbie CookeyEarly in 2 weeks.            Thanks,      Lelon MastSamantha ------

## 2014-08-11 ENCOUNTER — Other Ambulatory Visit: Payer: Self-pay | Admitting: *Deleted

## 2014-08-11 MED ORDER — OXYCODONE HCL 5 MG PO TABS: ORAL_TABLET | ORAL | Status: DC

## 2014-08-11 MED ORDER — PREGABALIN 50 MG PO CAPS: ORAL_CAPSULE | ORAL | Status: DC

## 2014-08-11 NOTE — Telephone Encounter (Signed)
[facility] 

## 2014-08-11 NOTE — Telephone Encounter (Signed)
[facility] 

## 2014-08-12 ENCOUNTER — Encounter: Payer: Self-pay | Admitting: Adult Health

## 2014-08-12 ENCOUNTER — Non-Acute Institutional Stay (SKILLED_NURSING_FACILITY): Payer: 59 | Admitting: Adult Health

## 2014-08-12 DIAGNOSIS — E785 Hyperlipidemia, unspecified: Secondary | ICD-10-CM

## 2014-08-12 DIAGNOSIS — E119 Type 2 diabetes mellitus without complications: Secondary | ICD-10-CM

## 2014-08-12 DIAGNOSIS — J45909 Unspecified asthma, uncomplicated: Secondary | ICD-10-CM

## 2014-08-12 DIAGNOSIS — I2510 Atherosclerotic heart disease of native coronary artery without angina pectoris: Secondary | ICD-10-CM

## 2014-08-12 DIAGNOSIS — I739 Peripheral vascular disease, unspecified: Secondary | ICD-10-CM

## 2014-08-12 DIAGNOSIS — I10 Essential (primary) hypertension: Secondary | ICD-10-CM

## 2014-08-12 DIAGNOSIS — N184 Chronic kidney disease, stage 4 (severe): Secondary | ICD-10-CM

## 2014-08-12 DIAGNOSIS — I5042 Chronic combined systolic (congestive) and diastolic (congestive) heart failure: Secondary | ICD-10-CM

## 2014-08-12 NOTE — Progress Notes (Signed)
Patient ID: Dorothy Bartlett, female   DOB: 1926/05/08, 80 y.o.   MRN: [id number]               PROGRESS NOTE  DATE: 08/12/2014  FACILITY: Nursing Home Location: [facility]  LEVEL OF CARE: SNF (31)  Acute Visit  CHIEF COMPLAINT:  Follow-up Hospitalization  HISTORY OF PRESENT ILLNESS:  This is an 80 year old female who has been admitted to [facility] on 08/10/14 from [facility] with Distal superficial femoral artery disease with popliteal occlusion on the right S/P right femoral peroneal bypass with in situ saphenous vein, right third toe amputation. She has been admitted for a short-term rehabilitation.  REASSESSMENT OF ONGOING PROBLEM(S):  HTN: Pt 's HTN remains stable.  Denies CP, sob, DOE, pedal edema, headaches, dizziness or visual disturbances.  No complications from the medications currently being used.  Last BP : 120/54  DM:pt's DM remains stable.  Pt denies polyuria, polydipsia, polyphagia, changes in vision or hypoglycemic episodes.  No complications noted from the medication presently being used.   4/15 hemoglobin A1c is: 7.6  ASTHMA: The patient's asthma remains stable. Patient denies shortness of breath, dyspnea on exertion or wheezing. No complications reported from the medications currently being used.  PAST MEDICAL HISTORY : Reviewed.  No changes/see problem list  CURRENT MEDICATIONS: Reviewed per MAR/see medication list  REVIEW OF SYSTEMS:  GENERAL: no change in appetite, no fatigue, no weight changes, no fever, chills or weakness RESPIRATORY: no cough, SOB, DOE, wheezing, hemoptysis CARDIAC: no chest pain, edema or palpitations GI: no abdominal pain, diarrhea, constipation, heart burn, nausea or vomiting  PHYSICAL EXAMINATION  GENERAL: no acute distress, obese EYES: conjunctivae normal, sclerae normal, normal eye lids NECK: supple, trachea midline, no neck masses, no thyroid tenderness, no thyromegaly LYMPHATICS: no LAN in the neck,  no supraclavicular LAN RESPIRATORY: breathing is even & unlabored, BS CTAB CARDIAC: RRR, no murmur,no extra heart sounds, no edema GI: abdomen soft, normal BS, no masses, no tenderness, no hepatomegaly, no splenomegaly EXTREMITIES: surgical wound on RLE with steristrips and right foot dressing (S/P right 3rd toe amputation) PSYCHIATRIC: the patient is alert & oriented to person, affect & behavior appropriate  LABS/RADIOLOGY: Labs reviewed: Basic Metabolic Panel:  Recent Labs  40/98/11 0440 08/05/14 0430 08/05/14 1615 08/08/14 0400  NA 142 143  --  136*  K 4.1 4.3  --  4.0  CL 103 106  --  100  CO2 24 20  --  22  GLUCOSE 130* 142*  --  100*  BUN 98* 78*  --  84*  CREATININE 3.53* 2.82* 3.16* 3.98*  CALCIUM 8.9 8.7  --  8.9   Liver Function Tests:  Recent Labs  08/04/14 0440  AST 17  ALT 13  ALKPHOS 64  BILITOT 0.3  PROT 6.4  ALBUMIN 3.0*   CBC:  Recent Labs  04/26/14 1012 04/28/14 0950 05/05/14 1005  08/05/14 0430 08/05/14 1615 08/08/14 0400 08/09/14 0845  WBC 8.9 7.8 8.0  < > 10.6* 9.7 8.2  --   NEUTROABS 6.5 5.5 5.6  --   --   --   --   --   HGB 8.8 Repeated and verified X2.* 9.1* 9.1*  < > 9.3* 8.2* 8.0* 7.8*  HCT 27.2* 28.1* 28.4*  < > 29.3* 26.0* 25.3* 24.5*  MCV 89.6 89.1 90.2  < > 90.4 90.9 92.3  --   PLT 186.0 198.0 220.0  < > 147* 146* 128*  --   < > =  values in this interval not displayed.  Cardiac Enzymes:  Recent Labs  04/13/14 2229 04/14/14 0310 04/14/14 0830  TROPONINI 0.69* 0.73* 0.69*   CBG:  Recent Labs  08/09/14 2202 08/10/14 0656 08/10/14 1110  GLUCAP 83 103* 188*     EXAM: PORTABLE CHEST - 1 VIEW   COMPARISON:  08/03/2014   FINDINGS: Right central line is been inserted and the tip is at the cavoatrial junction in good position. No pneumothorax. There is cardiomegaly. Vascularity is normal. No infiltrates or effusions. Markings are slightly accentuated to the bases due to a shallow inspiration.    IMPRESSION: Central line in good position.  Cardiomegaly.   ASSESSMENT/PLAN:  PAD S/P Right Femoral to peroneal bypass - for rehabilitation Hyperlipidemia - continue Atorvastatin Hypertension - well-controlled; stable; continue Lasix  Chronic combined systolic and diastolic heart failure -  Diabetes mellitus, type II - continue Humalog and Januvia CAD - stable; continue Imdur Asthma - stable; continue Albuterol PRN Chronic Kidney disease, stage IV - check BMP   CPT CODE: [id number]  Ella BodoMonina Vargas - NP [facility] [phone]

## 2014-08-13 ENCOUNTER — Ambulatory Visit: Payer: 59 | Admitting: Podiatry

## 2014-08-17 ENCOUNTER — Non-Acute Institutional Stay (SKILLED_NURSING_FACILITY): Payer: 59 | Admitting: Internal Medicine

## 2014-08-17 DIAGNOSIS — D62 Acute posthemorrhagic anemia: Secondary | ICD-10-CM

## 2014-08-17 DIAGNOSIS — E1129 Type 2 diabetes mellitus with other diabetic kidney complication: Secondary | ICD-10-CM

## 2014-08-17 DIAGNOSIS — I5042 Chronic combined systolic (congestive) and diastolic (congestive) heart failure: Secondary | ICD-10-CM

## 2014-08-17 DIAGNOSIS — I739 Peripheral vascular disease, unspecified: Secondary | ICD-10-CM

## 2014-08-17 NOTE — Progress Notes (Signed)
HISTORY & PHYSICAL  DATE: 08/17/2014   FACILITY: [facility]  LEVEL OF CARE: SNF (31)  ALLERGIES:  No Known Allergies  CHIEF COMPLAINT:  Manage PVD, CHF and acute blood loss anemia  HISTORY OF PRESENT ILLNESS: Patient is an 80 year old African American female who is admitted to this facility for short-term rehabilitation after her recent hospitalization.  PVD: The patient's peripheral vascular disease remains table. The patient denies ongoing claudication. No complications reported from the medication(s) currently being used. Patient was having gangrenous changes of the tip of her right third toe. She was having severe pain associated with this as well. She subsequently underwent right femoral to peroneal bypass with in situ saphenous vein, right third toe amputation and tolerated the procedure well.  ANEMIA: The anemia has been stable. The patient denies fatigue, melena or hematochezia. No complications from the medications currently being used. Postoperatively the patient suffered acute blood loss. She received 2 units of packed red blood cells.  CHF:The patient does not relate significant weight changes, denies sob, DOE, orthopnea, PNDs, palpitations or chest pain.  CHF remains stable.  No complications form the medications being used. Complains of lower extremity swelling.  PAST MEDICAL HISTORY :  Past Medical History  Diagnosis Date  . MI (myocardial infarction) 1995  . Asthma   . Hx of echocardiogram   . Chronic diastolic CHF (congestive heart failure)     a. Echocardiogram (04/2005):EF 55-65%, moderate LVH, mild aortic stenosis (mean gradient 9), moderate LAE.  [name] Hyperlipidemia   . PAD (peripheral artery disease)   . Carotid stenosis     Carotid US (12/2013): < 60% RCA, < 40% LICA.  [name] Type II diabetes mellitus   . Arthritis   . PVD (peripheral vascular disease)   . CKD (chronic kidney disease), stage IV     notes 02/22/2014; Dr. Hyman Hopes  .  Hypertension   . Coronary artery disease     a. s/p MI 1995 tx w/ PTCA to Dx, Ramus, ap LAD;  b. LHC (11/2004):  Inferior HK, EF 60%, proximal LAD 50%, ostial D1 80%, mid D1 95%, mid LAD 50-60%, distal LAD at the apex 90%, OM1 occluded (fills late by left to left collaterals), RCA 75%. Medical therapy recommended.;  c. Lexiscan Myoview (12/2013):  Prior ant-lat scar with very small area of peri-infarct ischemia in ant wall, EF 40 (high risk)     PAST SURGICAL HISTORY: Past Surgical History  Procedure Laterality Date  . Repair of nerve in left arm  Left 2000  . Cardiac catheterization  1995    w/ PTCA Diag 2nd MI  . Femoral-tibial bypass graft Left 02/24/2014    Procedure: BYPASS GRAFT FEMORAL-TIBIAL ARTERY;  Surgeon: Larina Earthly, MD;  Location: [facility] OR;  Service: Vascular;  Laterality: Left;  . Femoral-popliteal bypass graft Right 08/04/2014    Procedure: RIGHT FEMORAL-PERONEAL ARTERY BYPASS GRAFT;  Surgeon: Larina Earthly, MD;  Location: [facility] OR;  Service: Vascular;  Laterality: Right;  . Amputation Right 08/04/2014    Procedure: AMPUTATION DIGIT-RIGHT 3RD TOE;  Surgeon: Larina Earthly, MD;  Location: [facility] OR;  Service: Vascular;  Laterality: Right;    SOCIAL HISTORY:  reports that she has never smoked. She has never used smokeless tobacco. She reports that she does not drink alcohol or use illicit drugs.  FAMILY HISTORY:  Family History  Problem Relation Age of Onset  . Diabetes Father   . Hypertension Father   .  Hypertension Daughter   . Hyperlipidemia Daughter   . Hyperlipidemia Son   . Hypertension Sister     CURRENT MEDICATIONS: Reviewed per MAR/see medication list  REVIEW OF SYSTEMS:  See HPI otherwise 14 point ROS is negative.  PHYSICAL EXAMINATION  VS:  See VS section  GENERAL: no acute distress, moderately obese body habitus EYES: conjunctivae normal, sclerae normal, normal eye lids MOUTH/THROAT: lips without lesions,no lesions in the mouth,tongue is without lesions,uvula  elevates in midline NECK: supple, trachea midline, no neck masses, no thyroid tenderness, no thyromegaly LYMPHATICS: no LAN in the neck, no supraclavicular LAN RESPIRATORY: breathing is even & unlabored, BS CTAB CARDIAC: RRR, no murmur,no extra heart sounds, +3 bilateral lower extremely edema GI:  ABDOMEN: abdomen soft, normal BS, no masses, no tenderness  LIVER/SPLEEN: no hepatomegaly, no splenomegaly MUSCULOSKELETAL: HEAD: normal to inspection  EXTREMITIES: LEFT UPPER EXTREMITY: full range of motion, normal strength & tone RIGHT UPPER EXTREMITY:  full range of motion, normal strength & tone LEFT LOWER EXTREMITY:  Minimal range of motion, normal strength & tone RIGHT LOWER EXTREMITY: Unable to assess secondary to surgery PSYCHIATRIC: the patient is alert & oriented to person, affect & behavior appropriate  LABS/RADIOLOGY:  Labs reviewed: Basic Metabolic Panel:  Recent Labs  16/10/96 0440 08/05/14 0430 08/05/14 1615 08/08/14 0400  NA 142 143  --  136*  K 4.1 4.3  --  4.0  CL 103 106  --  100  CO2 24 20  --  22  GLUCOSE 130* 142*  --  100*  BUN 98* 78*  --  84*  CREATININE 3.53* 2.82* 3.16* 3.98*  CALCIUM 8.9 8.7  --  8.9   Liver Function Tests:  Recent Labs  08/04/14 0440  AST 17  ALT 13  ALKPHOS 64  BILITOT 0.3  PROT 6.4  ALBUMIN 3.0*   CBC:  Recent Labs  04/26/14 1012 04/28/14 0950 05/05/14 1005  08/05/14 0430 08/05/14 1615 08/08/14 0400 08/09/14 0845  WBC 8.9 7.8 8.0  < > 10.6* 9.7 8.2  --   NEUTROABS 6.5 5.5 5.6  --   --   --   --   --   HGB 8.8 Repeated and verified X2.* 9.1* 9.1*  < > 9.3* 8.2* 8.0* 7.8*  HCT 27.2* 28.1* 28.4*  < > 29.3* 26.0* 25.3* 24.5*  MCV 89.6 89.1 90.2  < > 90.4 90.9 92.3  --   PLT 186.0 198.0 220.0  < > 147* 146* 128*  --   < > = values in this interval not displayed.  Cardiac Enzymes:  Recent Labs  04/13/14 2229 04/14/14 0310 04/14/14 0830  TROPONINI 0.69* 0.73* 0.69*   CBG:  Recent Labs  08/09/14 2202  08/10/14 0656 08/10/14 1110  GLUCAP 83 103* 188*    Transthoracic Echocardiography  Patient:    Dorothy Bartlett, Dorothy Bartlett MR #:       [id number] Study Date: 02/11/2014 Gender:     F Age:        26 Height:     154.9cm Weight:     91.6kg BSA:        1.38m^2 Pt. Status: Room:    ATTENDING    Kenney Houseman  REFERRING    Veatrice Kells  SONOGRAPHER  Junious Dresser, RDCS  PERFORMING   Chmg, Outpatient cc:  ------------------------------------------------------------ LV EF: 45% -   50%  ------------------------------------------------------------ Indications:      Aortic stenosis 424.1.  CAD of  native vessels 414.01.  ------------------------------------------------------------ History:   PMH:  PAD. Acquired from the patient and from the patient's chart.  Murmur.  Coronary artery disease. Congestive heart failure, with an ejection fraction of 65%by echocardiography. The dysfunction is both systolic and diastolic.  Mild aortic stenosis.  PMH:   Myocardial infarction.  Risk factors:  Hypertension. Diabetes mellitus. Morbidly obese. Dyslipidemia.  ------------------------------------------------------------ Study Conclusions  - Left ventricle: The cavity size was normal. Wall thickness   was increased in a pattern of mild LVH. Systolic function   was mildly reduced. The estimated ejection fraction was in   the range of 45% to 50%. There is hypokinesis of the   inferior myocardium. Doppler parameters are consistent   with abnormal left ventricular relaxation (grade 1   diastolic dysfunction). Doppler parameters are consistent   with high ventricular filling pressure. - Aortic valve: Mean gradient: 8mm Hg (S). Peak gradient:   14mm Hg (S). - Mitral valve: Mild regurgitation. - Left atrium: The atrium was mildly dilated.   Anterior-posterior dimension: 44mm (2D). - Pulmonary arteries: Systolic pressure was mildly   increased. PA peak pressure: 41mm Hg  (S).  ------------------------------------------------------------ Labs, prior tests, procedures, and surgery: Echocardiography (2006).     EF was 65%. Aortic valve: peak gradient of 17mm Hg and mean gradient of 9mm Hg.  Resting radionuclide ventriculography (January 2015).     EF was 40%. Evidence of multiple on perfusion abnormality compatible with known CAD. Transthoracic echocardiography.  M-mode, complete 2D, spectral Doppler, and color Doppler.  Height:  Height: 154.9cm. Height: 61in.  Weight:  Weight: 91.6kg. Weight: 201.6lb.  Body mass index:  BMI: 38.2kg/m^2.  Body surface area:    BSA: 1.75m^2.  Blood pressure:     120/75.  Patient status:  Outpatient.  Location:  [facility]  ------------------------------------------------------------  ------------------------------------------------------------ Left ventricle:  The cavity size was normal. Wall thickness was increased in a pattern of mild LVH. Systolic function was mildly reduced. The estimated ejection fraction was in the range of 45% to 50%.  Regional wall motion abnormalities:   There is hypokinesis of the inferior myocardium. Doppler parameters are consistent with abnormal left ventricular relaxation (grade 1 diastolic dysfunction). Doppler parameters are consistent with high ventricular filling pressure.  ------------------------------------------------------------ Aortic valve:   Mildly thickened, moderately calcified leaflets. Cusp separation was normal.  Doppler: Transvalvular velocity was minimally increased. There was no stenosis.  No regurgitation.    VTI ratio of LVOT to aortic valve: 0.39. Valve area: 1.11cm^2(VTI). Indexed valve area: 0.58cm^2/m^2 (VTI). Peak velocity ratio of LVOT to aortic valve: 0.43. Valve area: 1.22cm^2 (Vmax). Indexed valve area: 0.64cm^2/m^2 (Vmax).    Mean gradient: 8mm Hg (S). Peak gradient: 14mm Hg  (S).  ------------------------------------------------------------ Aorta:  The aorta was normal, not dilated, and non-diseased.   ------------------------------------------------------------ Mitral valve:   Structurally normal valve.   Leaflet separation was normal.  Doppler:  Transvalvular velocity was within the normal range. There was no evidence for stenosis.  Mild regurgitation.    Peak gradient: 6mm Hg (D).  ------------------------------------------------------------ Left atrium:  The atrium was mildly dilated.  ------------------------------------------------------------ Right ventricle:  The cavity size was normal. Wall thickness was normal. Systolic function was normal.  ------------------------------------------------------------ Pulmonic valve:   Poorly visualized.  Structurally normal valve.   Cusp separation was normal.  Doppler: Transvalvular velocity was within the normal range.  Trivial regurgitation.  ------------------------------------------------------------ Tricuspid valve:   Structurally normal valve.   Leaflet separation was normal.  Doppler:  Transvalvular velocity was  within the normal range.  Mild regurgitation.  ------------------------------------------------------------ Pulmonary artery:   The main pulmonary artery was normal-sized. Systolic pressure was mildly increased.  ------------------------------------------------------------ Right atrium:  The atrium was normal in size.  ------------------------------------------------------------ Pericardium:  The pericardium was normal in appearance. There was no pericardial effusion.  ------------------------------------------------------------ Systemic veins: Inferior vena cava: The vessel was normal in size; the respirophasic diameter changes were in the normal range (= 50%); findings are consistent with normal central  venous pressure.  ------------------------------------------------------------ Post procedure conclusions Ascending Aorta:  - The aorta was normal, not dilated, and non-diseased.  ------------------------------------------------------------  2D measurements        Normal  Doppler measurements   Normal Left ventricle                 Main pulmonary LVID ED,   47.3 mm     43-52   artery chord,                         Pressure,    41 mm Hg  =30 PLAX                           S LVID ES,   43.3 mm     23-38   Left ventricle chord,                         Ea, lat     4.5 cm/s   ------ PLAX                           ann, tiss FS, chord,    8 %      >29     DP PLAX                           E/Ea, lat  26.2        ------ LVPW, ED   13.4 mm     ------  ann, tiss     2 IVS/LVPW   0.96        <1.3    DP ratio, ED                      Ea, med    4.83 cm/s   ------ Ventricular septum             ann, tiss IVS, ED    12.9 mm     ------  DP LVOT                           E/Ea, med  24.4        ------ Diam, S      19 mm     ------  ann, tiss     3 Area       2.84 cm^2   ------  DP Diam         19 mm     ------  LVOT Aorta                          Peak vel,    82 cm/s   ------ Root diam,   31 mm     ------  S ED  VTI, S     18.6 cm     ------ Left atrium                    Stroke vol 52.7 ml     ------ AP dim       44 mm     ------  Stroke     27.8 ml/m^2 ------ AP dim     2.32 cm/m^2 <2.2    index index                          Aortic valve                                Peak vel,   190 cm/s   ------                                S                                Mean vel,   130 cm/s   ------                                S                                VTI, S     47.5 cm     ------                                Mean          8 mm Hg  ------                                gradient,                                S                                Peak         14  mm Hg  ------                                gradient,                                S                                VTI ratio  0.39        ------                                LVOT/AV  Area, VTI  1.11 cm^2   ------                                Area index 0.58 cm^2/m ------                                (VTI)           ^2                                Peak vel   0.43        ------                                ratio,                                LVOT/AV                                Area, Vmax 1.22 cm^2   ------                                Area index 0.64 cm^2/m ------                                (Vmax)          ^2                                Mitral valve                                Peak E vel  118 cm/s   ------                                Peak A vel  142 cm/s   ------                                Decelerati  232 ms     150-23                                on time                0                                Peak          6 mm Hg  ------                                gradient,  D                                Peak E/A    0.8        ------                                ratio                                Tricuspid valve                                Regurg      308 cm/s   ------                                peak vel                                Peak RV-RA   38 mm Hg  ------                                gradient,                                S                                Systemic veins                                Estimated     3 mm Hg  ------                                CVP                                Right ventricle                                Pressure,    41 mm Hg  <30                                S                                Sa vel,    10.5 cm/s   ------                                lat ann,  tiss DP   Noninvasive  Vascular Lab  Lower Extremity Arterial Evaluation  Patient:    Dorothy Bartlett, Dorothy Bartlett MR #:       [id number] Study Date: 08/05/2014 Gender:     F Age:        63 Height: Weight: BSA: Pt. Status: Room:       3S14C   ATTENDING    Bertha Stakes  SONOGRAPHER  Gertie Fey, RVT, RDCS, RDMS  ADMITTING    Durene Cal  Reports also to:  ------------------------------------------------------------------- History and indications:  History  Diagnostic evaluation. Post right femoral-peroneal bypass.   ------------------------------------------------------------------- Study information:  Study status:  Routine.    ABI.     Segmental pressure measurements, ankle-brachial index, and photoplethysmography. Birthdate:  Patient birthdate: 1926-03-19.  Age:  Patient is 80 yr old.  Sex:  Gender: female.  Study date:  Study date: 08/05/2014. Study time: 05:01 PM.  Location:  Bedside.  Patient status: Inpatient.  Brachial pressures:  +--------+-----+----+---+         RightLeftMax +--------+-----+----+---+ [id number]  122 122 +--------+-----+----+---+  Arterial pressure indices:  +----------------+--------+------------+-------------------------+ Location        PressureBrachial    Waveform                                          index                                 +----------------+--------+------------+-------------------------+ Right ant tibial80 mm Hg0.66        Dampened monophasic       +----------------+--------+------------+-------------------------+ Right post      76 mm Hg0.62        Severely dampened         tibial                              monophasic                +----------------+--------+------------+-------------------------+ Right peroneal  97 mm Hg0.8         Dampened monophasic       +----------------+--------+------------+-------------------------+ Left ant tibial  --------------------Absent                    +----------------+--------+------------+-------------------------+ Left post tibial--------------------Dampened monophasic       +----------------+--------+------------+-------------------------+  ------------------------------------------------------------------- Summary: The right ABI is suggestive of mild arterial insufficiency. The left posterior tibial artery is noncompressible.  Other specific details can be found in the table(s) above. Prepared and Electronically Authenticated by  PORTABLE CHEST - 1 VIEW   COMPARISON:  08/03/2014   FINDINGS: Right central line is been inserted and the tip is at the cavoatrial junction in good position. No pneumothorax. There is cardiomegaly. Vascularity is normal. No infiltrates or effusions. Markings are slightly accentuated to the bases due to a shallow inspiration.   IMPRESSION: Central line in good position.  Cardiomegaly.   ASSESSMENT/PLAN:  PVD-status post bypass. Continue rehabilitation. CHF-compensated  Acute blood loss anemia-status post transfusion. Recheck hemoglobin. Diabetes mellitus with renal complications-continue [place] CAD-stable Renal vascular hypertension-adequately controlled Chronic kidney disease stage IV-recheck renal functions Check CBC and BMP  I have reviewed patient's medical records received at admission/from hospitalization.  CPT CODE: 82956  Angela Cox,  MD [place] [phone]

## 2014-08-23 ENCOUNTER — Encounter: Payer: Self-pay | Admitting: Vascular Surgery

## 2014-08-24 ENCOUNTER — Ambulatory Visit (INDEPENDENT_AMBULATORY_CARE_PROVIDER_SITE_OTHER): Payer: Self-pay | Admitting: Vascular Surgery

## 2014-08-24 ENCOUNTER — Encounter: Payer: Self-pay | Admitting: Adult Health

## 2014-08-24 ENCOUNTER — Non-Acute Institutional Stay (SKILLED_NURSING_FACILITY): Payer: 59 | Admitting: Adult Health

## 2014-08-24 ENCOUNTER — Encounter: Payer: Self-pay | Admitting: Vascular Surgery

## 2014-08-24 VITALS — BP 174/57 | HR 67 | Temp 98.4°F | Ht 65.0 in | Wt 187.0 lb

## 2014-08-24 DIAGNOSIS — I5042 Chronic combined systolic (congestive) and diastolic (congestive) heart failure: Secondary | ICD-10-CM

## 2014-08-24 DIAGNOSIS — I739 Peripheral vascular disease, unspecified: Secondary | ICD-10-CM

## 2014-08-24 DIAGNOSIS — D62 Acute posthemorrhagic anemia: Secondary | ICD-10-CM

## 2014-08-24 DIAGNOSIS — I2510 Atherosclerotic heart disease of native coronary artery without angina pectoris: Secondary | ICD-10-CM

## 2014-08-24 DIAGNOSIS — J45909 Unspecified asthma, uncomplicated: Secondary | ICD-10-CM

## 2014-08-24 DIAGNOSIS — N184 Chronic kidney disease, stage 4 (severe): Secondary | ICD-10-CM

## 2014-08-24 DIAGNOSIS — I10 Essential (primary) hypertension: Secondary | ICD-10-CM

## 2014-08-24 DIAGNOSIS — E785 Hyperlipidemia, unspecified: Secondary | ICD-10-CM

## 2014-08-24 DIAGNOSIS — E1129 Type 2 diabetes mellitus with other diabetic kidney complication: Secondary | ICD-10-CM

## 2014-08-24 NOTE — Addendum Note (Signed)
Addended by: Sharee Pimple on: 08/24/2014 04:51 PM   Modules accepted: Orders

## 2014-08-24 NOTE — Progress Notes (Signed)
Today for followup of her lesions right femoral to peroneal bypass and amputation of her right third toe. Despite her significant cardiac and renal dysfunction, she did quite well with surgery. She did spend 12 days in a skilled nursing facility and is being discharged home today.  On physical exam she looks quite good. Her incisions are well-healed and she has 2+ soft cutaneous graft pulse on the right and left leg. Her third toe amputation site is healing nicely and I removed her sutures today.  Impression and plan: Stable followup from right femoral to peroneal bypass with saphenous vein and toe amputation. We'll see her again in 3 months for continued follow

## 2014-08-25 DIAGNOSIS — I739 Peripheral vascular disease, unspecified: Secondary | ICD-10-CM

## 2014-08-25 DIAGNOSIS — I5042 Chronic combined systolic (congestive) and diastolic (congestive) heart failure: Secondary | ICD-10-CM

## 2014-08-25 DIAGNOSIS — N184 Chronic kidney disease, stage 4 (severe): Secondary | ICD-10-CM

## 2014-08-25 DIAGNOSIS — Z48812 Encounter for surgical aftercare following surgery on the circulatory system: Secondary | ICD-10-CM

## 2014-08-25 DIAGNOSIS — I129 Hypertensive chronic kidney disease with stage 1 through stage 4 chronic kidney disease, or unspecified chronic kidney disease: Secondary | ICD-10-CM

## 2014-08-25 DIAGNOSIS — E119 Type 2 diabetes mellitus without complications: Secondary | ICD-10-CM

## 2014-08-30 NOTE — Progress Notes (Signed)
Patient ID: Dorothy Bartlett, female   DOB: [birth year], 80 y.o.   MRN: [id number]              PROGRESS NOTE  DATE:       08/24/14  FACILITY: Nursing Home Location: [facility]  LEVEL OF CARE: SNF (31)  Acute Visit  CHIEF COMPLAINT:  Discharge Notes  HISTORY OF PRESENT ILLNESS:  This is an 80 year old female who is for discharge home with Home health PT, OT and Nursing. She has been admitted to [facility] on 08/10/14 from [facility] with Distal superficial femoral artery disease with popliteal occlusion on the right S/P right femoral peroneal bypass with in situ saphenous vein, right third toe amputation. Patient was admitted to this facility for short-term rehabilitation after the patient's recent hospitalization.  Patient has completed SNF rehabilitation and therapy has cleared the patient for discharge.   REASSESSMENT OF ONGOING PROBLEM(S):  HTN: Pt 's HTN remains stable.  Denies CP, sob, DOE, pedal edema, headaches, dizziness or visual disturbances.  No complications from the medications currently being used.  Last BP : 130/53  HYPERLIPIDEMIA: No complications from the medications presently being used. Last fasting lipid panel showed : not available  CAD: The angina has been stable. The patient denies dyspnea on exertion, orthopnea, pedal edema, palpitations and paroxysmal nocturnal dyspnea. No complications noted from the medication presently being used.   PAST MEDICAL HISTORY : Reviewed.  No changes/see problem list  CURRENT MEDICATIONS: Reviewed per MAR/see medication list  REVIEW OF SYSTEMS:  GENERAL: no change in appetite, no fatigue, no weight changes, no fever, chills or weakness RESPIRATORY: no cough, SOB, DOE, wheezing, hemoptysis CARDIAC: no chest pain, edema or palpitations GI: no abdominal pain, diarrhea, constipation, heart burn, nausea or vomiting  PHYSICAL EXAMINATION  GENERAL: no acute distress, obese NECK: supple, trachea midline, no  neck masses, no thyroid tenderness, no thyromegaly LYMPHATICS: no LAN in the neck, no supraclavicular LAN RESPIRATORY: breathing is even & unlabored, BS CTAB CARDIAC: RRR, no murmur,no extra heart sounds, no edema GI: abdomen soft, normal BS, no masses, no tenderness, no hepatomegaly, no splenomegaly EXTREMITIES: surgical wound on RLE with steristrips and right foot dressing (S/P right 3rd toe amputation); noted to have sutures but daughter reported  That they are going to the doctor's office today for suture removal PSYCHIATRIC: the patient is alert & oriented to person, affect & behavior appropriate  LABS/RADIOLOGY: 08/18/14  WBC 9.4 hemoglobin 9.5 hematocrit 30.9 MCV 92.2 sodium 138 potassium 4.0 glucose 96 BUN 75 creatinine 3.7 calcium 9.0 Labs reviewed: Basic Metabolic Panel:  Recent Labs  40/98/11 0440 08/05/14 0430 08/05/14 1615 08/08/14 0400  NA 142 143  --  136*  K 4.1 4.3  --  4.0  CL 103 106  --  100  CO2 24 20  --  22  GLUCOSE 130* 142*  --  100*  BUN 98* 78*  --  84*  CREATININE 3.53* 2.82* 3.16* 3.98*  CALCIUM 8.9 8.7  --  8.9   Liver Function Tests:  Recent Labs  08/04/14 0440  AST 17  ALT 13  ALKPHOS 64  BILITOT 0.3  PROT 6.4  ALBUMIN 3.0*   CBC:  Recent Labs  04/26/14 1012 04/28/14 0950 05/05/14 1005  08/05/14 0430 08/05/14 1615 08/08/14 0400 08/09/14 0845  WBC 8.9 7.8 8.0  < > 10.6* 9.7 8.2  --   NEUTROABS 6.5 5.5 5.6  --   --   --   --   --  HGB 8.8 Repeated and verified X2.* 9.1* 9.1*  < > 9.3* 8.2* 8.0* 7.8*  HCT 27.2* 28.1* 28.4*  < > 29.3* 26.0* 25.3* 24.5*  MCV 89.6 89.1 90.2  < > 90.4 90.9 92.3  --   PLT 186.0 198.0 220.0  < > 147* 146* 128*  --   < > = values in this interval not displayed.  Cardiac Enzymes:  Recent Labs  04/13/14 2229 04/14/14 0310 04/14/14 0830  TROPONINI 0.69* 0.73* 0.69*   CBG:  Recent Labs  08/09/14 2202 08/10/14 0656 08/10/14 1110  GLUCAP 83 103* 188*     EXAM: PORTABLE CHEST - 1 VIEW     COMPARISON:  08/03/2014   FINDINGS: Right central line is been inserted and the tip is at the cavoatrial junction in good position. No pneumothorax. There is cardiomegaly. Vascularity is normal. No infiltrates or effusions. Markings are slightly accentuated to the bases due to a shallow inspiration.   IMPRESSION: Central line in good position.  Cardiomegaly.   ASSESSMENT/PLAN:  PAD S/P Right Femoral to peroneal bypass - for home health PT, OT and nursing Hyperlipidemia - continue Atorvastatin Hypertension - well-controlled; stable; continue Lasix  Chronic combined systolic and diastolic heart failure -  Diabetes mellitus, type II - continue Humalog and Januvia CAD - stable; continue Imdur Asthma - stable; continue Albuterol PRN Chronic Kidney disease, stage IV - stable Anemia , acute blood loss - stable   I have filled out patient's discharge paperwork and written prescriptions.  Patient will receive home health PT, OT and Nursing.   Total discharge time: Less than 30 minutes  Discharge time involved coordination of the discharge process with Child psychotherapist, nursing staff and therapy department. Medical justification for home health services verified.   CPT CODE: 16109  Ella Bodo - NP [facility] [phone]

## 2014-09-02 ENCOUNTER — Other Ambulatory Visit (HOSPITAL_COMMUNITY): Payer: Self-pay | Admitting: *Deleted

## 2014-09-03 ENCOUNTER — Encounter (HOSPITAL_COMMUNITY)
Admission: RE | Admit: 2014-09-03 | Discharge: 2014-09-03 | Disposition: A | Discharge status: Home / Self Care | Payer: Medicare Other | Source: Ambulatory Visit | Attending: Nephrology | Admitting: Nephrology

## 2014-09-03 DIAGNOSIS — D638 Anemia in other chronic diseases classified elsewhere: Secondary | ICD-10-CM | POA: Diagnosis not present

## 2014-09-03 DIAGNOSIS — N183 Chronic kidney disease, stage 3 unspecified: Secondary | ICD-10-CM | POA: Insufficient documentation

## 2014-09-03 MED ORDER — DARBEPOETIN ALFA-POLYSORBATE 500 MCG/ML IJ SOLN
40.0000 ug | INTRAMUSCULAR | Status: DC
  Filled 2014-09-03: qty 1

## 2014-09-03 MED ORDER — DARBEPOETIN ALFA-POLYSORBATE 40 MCG/0.4ML IJ SOLN
INTRAMUSCULAR | Status: AC
Start: 2014-09-03 — End: 2014-09-03
  Administered 2014-09-03: 40 ug via SUBCUTANEOUS
  Filled 2014-09-03: qty 0.4

## 2014-10-01 ENCOUNTER — Encounter (HOSPITAL_COMMUNITY)
Admission: RE | Admit: 2014-10-01 | Discharge: 2014-10-01 | Disposition: A | Discharge status: Home / Self Care | Payer: Medicare Other | Source: Ambulatory Visit | Attending: Nephrology | Admitting: Nephrology

## 2014-10-01 DIAGNOSIS — N183 Chronic kidney disease, stage 3 (moderate): Secondary | ICD-10-CM | POA: Insufficient documentation

## 2014-10-01 DIAGNOSIS — D631 Anemia in chronic kidney disease: Secondary | ICD-10-CM | POA: Insufficient documentation

## 2014-10-01 MED ORDER — DARBEPOETIN ALFA-POLYSORBATE 500 MCG/ML IJ SOLN
40.0000 ug | INTRAMUSCULAR | Status: DC
  Filled 2014-10-01: qty 1

## 2014-10-01 MED ORDER — DARBEPOETIN ALFA-POLYSORBATE 40 MCG/0.4ML IJ SOLN
INTRAMUSCULAR | Status: AC
  Administered 2014-10-01: 40 ug via SUBCUTANEOUS
  Filled 2014-10-01: qty 0.4

## 2014-10-07 ENCOUNTER — Other Ambulatory Visit (HOSPITAL_COMMUNITY): Payer: Self-pay | Admitting: *Deleted

## 2014-10-07 DIAGNOSIS — I6523 Occlusion and stenosis of bilateral carotid arteries: Secondary | ICD-10-CM

## 2014-10-13 ENCOUNTER — Ambulatory Visit (HOSPITAL_COMMUNITY): Payer: Medicare Other | Attending: Cardiovascular Disease | Admitting: *Deleted

## 2014-10-13 DIAGNOSIS — J449 Chronic obstructive pulmonary disease, unspecified: Secondary | ICD-10-CM | POA: Diagnosis not present

## 2014-10-13 DIAGNOSIS — I10 Essential (primary) hypertension: Secondary | ICD-10-CM | POA: Diagnosis not present

## 2014-10-13 DIAGNOSIS — E785 Hyperlipidemia, unspecified: Secondary | ICD-10-CM | POA: Diagnosis not present

## 2014-10-13 DIAGNOSIS — I2510 Atherosclerotic heart disease of native coronary artery without angina pectoris: Secondary | ICD-10-CM | POA: Insufficient documentation

## 2014-10-13 DIAGNOSIS — E119 Type 2 diabetes mellitus without complications: Secondary | ICD-10-CM | POA: Insufficient documentation

## 2014-10-13 DIAGNOSIS — I6523 Occlusion and stenosis of bilateral carotid arteries: Secondary | ICD-10-CM

## 2014-10-13 NOTE — Progress Notes (Signed)
Carotid duplex complete 

## 2014-10-19 ENCOUNTER — Telehealth: Payer: Self-pay | Admitting: Interventional Cardiology

## 2014-10-19 NOTE — Telephone Encounter (Signed)
New message    Daughter calling back regarding form.

## 2014-10-20 ENCOUNTER — Telehealth: Payer: Self-pay

## 2014-10-20 NOTE — Telephone Encounter (Signed)
Phone call from pt's daughter.  Reported pt. C/o feeling that something needed to be removed from Right 3rd toe amp. site, approx. 1 wk. ago.   Reported that the site bled when the pt. tried to pull something off of the area, and now "looks a little dark."  Denied any further bleeding or drainage.  Stated pt. has been keeping the area clean/ dry.  Requested appt. for incision check.  Appt. given for 10:00 AM 10/21/14, with nurse practitioner.  Agrees with plan.

## 2014-10-21 ENCOUNTER — Ambulatory Visit (INDEPENDENT_AMBULATORY_CARE_PROVIDER_SITE_OTHER): Payer: Self-pay | Admitting: Family

## 2014-10-21 ENCOUNTER — Ambulatory Visit (HOSPITAL_COMMUNITY)
Admission: RE | Admit: 2014-10-21 | Discharge: 2014-10-21 | Disposition: A | Discharge status: Home / Self Care | Payer: Medicare Other | Source: Ambulatory Visit | Attending: Vascular Surgery | Admitting: Vascular Surgery

## 2014-10-21 ENCOUNTER — Encounter: Payer: Self-pay | Admitting: Family

## 2014-10-21 VITALS — BP 167/72 | HR 53 | Temp 97.7°F | Resp 14 | Ht 59.0 in | Wt 184.0 lb

## 2014-10-21 DIAGNOSIS — I739 Peripheral vascular disease, unspecified: Secondary | ICD-10-CM

## 2014-10-21 DIAGNOSIS — L819 Disorder of pigmentation, unspecified: Secondary | ICD-10-CM | POA: Insufficient documentation

## 2014-10-21 NOTE — Progress Notes (Signed)
[facility] [place] HISTORY AND PHYSICAL -PAD  History of Present Illness Dorothy Bartlett is a 80 y.o. female patient od Dr. Arbie CookeyEarly who is s/p right femoral to peroneal bypass and amputation of her right third toe on 08/04/14. She returns today with c/o discoloration of right 3rd toe amputation site, 2 wks. The patient denies pain, denies fever or chills, denies drainage at this site. Discussed with Dr. Darrick PennaFields, he requested an arterial US of the right LE to see if the graft is open.   Pt Diabetic: Yes Pt smoker: non-smoker  Pt meds include: Statin :Yes Betablocker: Yes ASA: Yes Other anticoagulants/antiplatelets: no  Past Medical History  Diagnosis Date  . MI (myocardial infarction) 1995  . Asthma   . Hx of echocardiogram   . Chronic diastolic CHF (congestive heart failure)     a. Echocardiogram (04/2005):EF 55-65%, moderate LVH, mild aortic stenosis (mean gradient 9), moderate LAE.  [name]. Hyperlipidemia   . PAD (peripheral artery disease)   . Carotid stenosis     Carotid US (12/2013): < 60% RCA, < 40% LICA.  [name]. Type II diabetes mellitus   . Arthritis   . PVD (peripheral vascular disease)   . CKD (chronic kidney disease), stage IV     notes 02/22/2014; Dr. Hyman HopesWebb  . Hypertension   . Coronary artery disease     a. s/p MI 1995 tx w/ PTCA to Dx, Ramus, ap LAD;  b. LHC (11/2004):  Inferior HK, EF 60%, proximal LAD 50%, ostial D1 80%, mid D1 95%, mid LAD 50-60%, distal LAD at the apex 90%, OM1 occluded (fills late by left to left collaterals), RCA 75%. Medical therapy recommended.;  c. Lexiscan Myoview (12/2013):  Prior ant-lat scar with very small area of peri-infarct ischemia in ant wall, EF 40 (high risk)     Social History History  Substance Use Topics  . Smoking status: Never Smoker   . Smokeless tobacco: Never Used  . Alcohol Use: No    Family History Family History  Problem Relation Age of Onset  . Diabetes Father   . Hypertension Father   . Hypertension  Daughter   . Hyperlipidemia Daughter   . Hyperlipidemia Son   . Hypertension Sister     Past Surgical History  Procedure Laterality Date  . Repair of nerve in left arm  Left 2000  . Cardiac catheterization  1995    w/ PTCA Diag 2nd MI  . Femoral-tibial bypass graft Left 02/24/2014    Procedure: BYPASS GRAFT FEMORAL-TIBIAL ARTERY;  Surgeon: Larina Earthlyodd F Early, MD;  [facility];  Service: Vascular;  Laterality: Left;  . Femoral-popliteal bypass graft Right 08/04/2014    Procedure: RIGHT FEMORAL-PERONEAL ARTERY BYPASS GRAFT;  Surgeon: Larina Earthlyodd F Early, MD;  [facility];  Service: Vascular;  Laterality: Right;  . Amputation Right 08/04/2014    Procedure: AMPUTATION DIGIT-RIGHT 3RD TOE;  Surgeon: Larina Earthlyodd F Early, MD;  [facility];  Service: Vascular;  Laterality: Right;    No Known Allergies  Current Outpatient Prescriptions  Medication Sig Dispense Refill  . albuterol (PROVENTIL HFA;VENTOLIN HFA) 108 (90 BASE) MCG/ACT inhaler Inhale 2 puffs into the lungs every 6 (six) hours as needed for wheezing or shortness of breath.      [name]. aspirin 81 MG tablet Take 81 mg by mouth every morning.       [name]. atorvastatin (LIPITOR) 40 MG tablet Take 40 mg by mouth every morning.       [name]. B  Complex-C-Folic Acid (RENA-VITE RX) 1 MG TABS Take 1 tablet by mouth every morning.       . carvedilol (COREG) 12.5 MG tablet Take 1 tablet (12.5 mg total) by mouth 2 (two) times daily.  180 tablet  1  . docusate sodium (COLACE) 100 MG capsule Take 100 mg by mouth at bedtime.       . furosemide (LASIX) 40 MG tablet Take 1 tablet (40 mg total) by mouth daily.      . hydrALAZINE (APRESOLINE) 10 MG tablet Take 10 mg by mouth 3 (three) times daily.      . insulin lispro (HUMALOG) 100 UNIT/ML injection Inject 5 Units into the skin 2 (two) times daily as needed for high blood sugar. Only takes if CBG >250.      [name] isosorbide mononitrate (IMDUR) 30 MG 24 hr tablet Take 1 tablet (30 mg total) by mouth daily.  30 tablet  0  . Melatonin 3  MG TABS Take 3 mg by mouth at bedtime as needed (for sleep).       [name] DELICA LANCETS 33G MISC       . ONETOUCH VERIO test strip       . paricalcitol (ZEMPLAR) 1 MCG capsule Take 1 mcg by mouth daily.       . sitaGLIPtin (JANUVIA) 25 MG tablet Take 25 mg by mouth daily.      [name] doxycycline (DORYX) 100 MG DR capsule Take 100 mg by mouth 2 (two) times daily. Starting 7/17 for 10 days      . oxyCODONE (ROXICODONE) 5 MG immediate release tablet Take one tablet by mouth every 6 hours as needed for severe pain  120 tablet  0  . pregabalin (LYRICA) 50 MG capsule Take one capsule by mouth once daily for pains  30 capsule  5   No current facility-administered medications for this visit.    ROS: See HPI for pertinent positives and negatives.   Physical Examination  Filed Vitals:   10/21/14 1105  BP: 167/72  Pulse: 53  Temp: 97.7 F (36.5 C)  TempSrc: Oral  Resp: 14  Height: 4\' 11"  (1.499 m)  Weight: 184 lb (83.462 kg)  SpO2: 100%   Body mass index is 37.14 kg/(m^2).  Amputation site of right third toe has what appears to be a small accumulation of necrotic tissue. Dorsal aspect of left toes are dusky.  Non-Invasive Vascular Imaging: DATE: 10/21/2014 LOWER EXTREMITY ARTERIAL DUPLEX EVALUATION    INDICATION: Right lower extremity peripheral vascular disease     PREVIOUS INTERVENTION(S): Right femoral to peroneal artery bypass graft 08/04/2014.    DUPLEX EXAM:     RIGHT  LEFT   Peak Systolic Velocity (cm/s) Ratio (if abnormal) Waveform  Peak Systolic Velocity (cm/s) Ratio (if abnormal) Waveform  188/206  T/T Inflow Artery     206  T Proximal Anastomosis     103  T Proximal Graft     84  B Mid Graft     76  B  Distal Graft     68  B Distal Anastomosis     0  Occluded Outflow Artery     NA Today's ABI / TBI NA  0.56 Previous ABI / TBI (07/27/2014  ) 0.38/[place]    Waveform:    M - Monophasic       B - Biphasic       T - Triphasic  If Ankle Brachial Index (ABI) or Toe  Brachial Index (TBI) performed,  please see complete report  ADDITIONAL FINDINGS:     IMPRESSION: Patent arterial inflow with mildly elevated velocities, however no hemodynamically significant plaque is visualized. Patent right superficial femoral to peroneal artery bypass graft. No color or spectral Doppler waveform could be obtained suggestive of vessel occlusion of the right peroneal artery at the bypass graft distal anastomosis.    Compared to the previous exam:  No Duplex since surgery to compare.     ASSESSMENT: Dorothy Bartlett is a 80 y.o. female who is s/p right femoral to peroneal bypass and amputation of her right third toe on 08/04/14. She returns today with c/o discoloration of right 3rd toe amputation site, for 2 weeks. The patient denies pain, denies fever or chills, denies drainage at this site. Discussed with Dr. Darrick PennaFields, he requested an arterial US of the right LE to see if the graft is open. Patient could not stay for the results, she scheduled an appointment for tomorrow.  Today's right lower extremity arterial Duplex: Patent arterial inflow with mildly elevated velocities, however no hemodynamically significant plaque is visualized. Patent right superficial femoral to peroneal artery bypass graft. No color or spectral Doppler waveform could be obtained suggestive of vessel occlusion of the right peroneal artery at the bypass graft distal anastomosis.   PLAN:  I discussed in depth with the patient the nature of atherosclerosis, and emphasized the importance of maximal medical management including strict control of blood pressure, blood glucose, and lipid levels, obtaining regular exercise, and continued cessation of smoking.  The patient is aware that without maximal medical management the underlying atherosclerotic disease process will progress, limiting the benefit of any interventions.  The patient was given information about PAD including signs, symptoms, treatment, what  symptoms should prompt the patient to seek immediate medical care, and risk reduction measures to take.  Charisse MarchSuzanne Derron Pipkins, RN, MSN, FNP-C [facility] [place] Office Phone: [phone]  [facility]  10/21/2014 11:17 AM

## 2014-10-21 NOTE — Patient Instructions (Signed)

## 2014-10-22 ENCOUNTER — Encounter (HOSPITAL_COMMUNITY): Payer: Self-pay | Admitting: Pharmacy Technician

## 2014-10-22 ENCOUNTER — Ambulatory Visit (INDEPENDENT_AMBULATORY_CARE_PROVIDER_SITE_OTHER): Payer: Medicare Other | Admitting: Family

## 2014-10-22 ENCOUNTER — Other Ambulatory Visit: Payer: Self-pay

## 2014-10-22 ENCOUNTER — Encounter: Payer: Self-pay | Admitting: Family

## 2014-10-22 DIAGNOSIS — T82898D Other specified complication of vascular prosthetic devices, implants and grafts, subsequent encounter: Secondary | ICD-10-CM

## 2014-10-22 NOTE — Progress Notes (Signed)
[facility] [place] HISTORY AND PHYSICAL -PAD  History of Present Illness Dorothy Bartlett is a 80 y.o. female patient od Dr. Arbie CookeyEarly who is s/p right femoral to peroneal bypass and amputation of her right third toe on 08/04/14.  She returns today with c/o discoloration of right 3rd toe amputation site, 2 wks.  The patient denies pain, denies fever or chills, denies drainage at this site.  Discussed with Dr. Darrick PennaFields yesterday, he requested an arterial US of the right LE to see if the graft is open.   Pt Diabetic: Yes  Pt smoker: non-smoker  Pt meds include:  Statin :Yes  Betablocker: Yes  ASA: Yes  Other anticoagulants/antiplatelets: no   Past Medical History  Diagnosis Date  . MI (myocardial infarction) 1995  . Asthma   . Hx of echocardiogram   . Chronic diastolic CHF (congestive heart failure)     a. Echocardiogram (04/2005):EF 55-65%, moderate LVH, mild aortic stenosis (mean gradient 9), moderate LAE.  [name]. Hyperlipidemia   . PAD (peripheral artery disease)   . Carotid stenosis     Carotid US (12/2013): < 60% RCA, < 40% LICA.  [name]. Type II diabetes mellitus   . Arthritis   . PVD (peripheral vascular disease)   . CKD (chronic kidney disease), stage IV     notes 02/22/2014; Dr. Hyman HopesWebb  . Hypertension   . Coronary artery disease     a. s/p MI 1995 tx w/ PTCA to Dx, Ramus, ap LAD;  b. LHC (11/2004):  Inferior HK, EF 60%, proximal LAD 50%, ostial D1 80%, mid D1 95%, mid LAD 50-60%, distal LAD at the apex 90%, OM1 occluded (fills late by left to left collaterals), RCA 75%. Medical therapy recommended.;  c. Lexiscan Myoview (12/2013):  Prior ant-lat scar with very small area of peri-infarct ischemia in ant wall, EF 40 (high risk)     Social History History  Substance Use Topics  . Smoking status: Never Smoker   . Smokeless tobacco: Never Used  . Alcohol Use: No    Family History Family History  Problem Relation Age of Onset  . Diabetes Father   . Hypertension Father    . Hypertension Daughter   . Hyperlipidemia Daughter   . Hyperlipidemia Son   . Hypertension Sister     Past Surgical History  Procedure Laterality Date  . Repair of nerve in left arm  Left 2000  . Cardiac catheterization  1995    w/ PTCA Diag 2nd MI  . Femoral-tibial bypass graft Left 02/24/2014    Procedure: BYPASS GRAFT FEMORAL-TIBIAL ARTERY;  Surgeon: Larina Earthlyodd F Early, MD;  [facility];  Service: Vascular;  Laterality: Left;  . Femoral-popliteal bypass graft Right 08/04/2014    Procedure: RIGHT FEMORAL-PERONEAL ARTERY BYPASS GRAFT;  Surgeon: Larina Earthlyodd F Early, MD;  [facility];  Service: Vascular;  Laterality: Right;  . Amputation Right 08/04/2014    Procedure: AMPUTATION DIGIT-RIGHT 3RD TOE;  Surgeon: Larina Earthlyodd F Early, MD;  [facility];  Service: Vascular;  Laterality: Right;    No Known Allergies  Current Outpatient Prescriptions  Medication Sig Dispense Refill  . albuterol (PROVENTIL HFA;VENTOLIN HFA) 108 (90 BASE) MCG/ACT inhaler Inhale 2 puffs into the lungs every 6 (six) hours as needed for wheezing or shortness of breath.      [name]. aspirin 81 MG tablet Take 81 mg by mouth every morning.       [name]. atorvastatin (LIPITOR) 40 MG tablet Take 40 mg by mouth  every morning.       . B Complex-C-Folic Acid (RENA-VITE RX) 1 MG TABS Take 1 tablet by mouth every morning.       . carvedilol (COREG) 12.5 MG tablet Take 1 tablet (12.5 mg total) by mouth 2 (two) times daily.  180 tablet  1  . docusate sodium (COLACE) 100 MG capsule Take 100 mg by mouth at bedtime.       [name] doxycycline (DORYX) 100 MG DR capsule Take 100 mg by mouth 2 (two) times daily. Starting 7/17 for 10 days      . furosemide (LASIX) 40 MG tablet Take 1 tablet (40 mg total) by mouth daily.      . hydrALAZINE (APRESOLINE) 10 MG tablet Take 10 mg by mouth 3 (three) times daily.      . insulin lispro (HUMALOG) 100 UNIT/ML injection Inject 5 Units into the skin 2 (two) times daily as needed for high blood sugar. Only takes if CBG >250.       [name] isosorbide mononitrate (IMDUR) 30 MG 24 hr tablet Take 1 tablet (30 mg total) by mouth daily.  30 tablet  0  . Melatonin 3 MG TABS Take 3 mg by mouth at bedtime as needed (for sleep).       [name] DELICA LANCETS 33G MISC       . ONETOUCH VERIO test strip       . oxyCODONE (ROXICODONE) 5 MG immediate release tablet Take one tablet by mouth every 6 hours as needed for severe pain  120 tablet  0  . paricalcitol (ZEMPLAR) 1 MCG capsule Take 1 mcg by mouth daily.       . pregabalin (LYRICA) 50 MG capsule Take one capsule by mouth once daily for pains  30 capsule  5  . sitaGLIPtin (JANUVIA) 25 MG tablet Take 25 mg by mouth daily.       No current facility-administered medications for this visit.    ROS: See HPI for pertinent positives and negatives.   Physical Examination  Amputation site of right third toe has what appears to be a small accumulation of necrotic tissue.  Dorsal aspect of left toes are dusky.    Non-Invasive Vascular Imaging: DATE: 10/21/2014  LOWER EXTREMITY ARTERIAL DUPLEX EVALUATION     INDICATION:  Right lower extremity peripheral vascular disease     PREVIOUS INTERVENTION(S):  Right femoral to peroneal artery bypass graft 08/04/2014.     DUPLEX EXAM:      RIGHT   LEFT   Peak Systolic Velocity (cm/s)  Ratio (if abnormal)  Waveform   Peak Systolic Velocity (cm/s)  Ratio (if abnormal)  Waveform   188/206   T/T  Inflow Artery      206   T  Proximal Anastomosis      103   T  Proximal Graft      84   B  Mid Graft      76   B  Distal Graft      68   B  Distal Anastomosis      0   Occluded  Outflow Artery      NA  Today's ABI / TBI  NA   0.56  Previous ABI / TBI (07/27/2014 )  0.38/[place]     Waveform: M - Monophasic B - Biphasic T - Triphasic  If Ankle Brachial Index (ABI) or Toe Brachial Index (TBI) performed, please see complete report   ADDITIONAL FINDINGS:  IMPRESSION:  Patent arterial inflow with mildly elevated velocities, however no  hemodynamically significant plaque is visualized.  Patent right superficial femoral to peroneal artery bypass graft.  No color or spectral Doppler waveform could be obtained suggestive of vessel occlusion of the right peroneal artery at the bypass graft distal anastomosis.     Compared to the previous exam:  No Duplex since surgery to compare.       ASSESSMENT:  Dorothy Bartlett is a 80 y.o. female who is s/p right femoral to peroneal bypass and amputation of her right third toe on 08/04/14.  She returns today with c/o discoloration of right 3rd toe amputation site, for 2 weeks.  The patient denies pain, denies fever or chills, denies drainage at this site.  Discussed with Dr. Darrick PennaFields yesterday, he requested an arterial US of the right LE to see if the graft is open.    10/21/14 right lower extremity arterial Duplex:  Patent arterial inflow with mildly elevated velocities, however no hemodynamically significant plaque is visualized.  Patent right superficial femoral to peroneal artery bypass graft.  No color or spectral Doppler waveform could be obtained suggestive of vessel occlusion of the right peroneal artery at the bypass graft distal anastomosis.  PLAN:  Based on the patient's vascular studies and examination, and after discussing with Dr. Imogene Burnhen, the patient will be scheduled for an arteriogram with bilateral runoff, attention to right LE distal graft occlusion, Monday, 10/25/14, with Dr. Edilia Boickson.  Charisse MarchSuzanne Nickel, RN, MSN, FNP-C [facility] [place] Office Phone: [phone]  Clinic MD: Imogene BurnChen  10/22/2014 12:03 PM

## 2014-10-23 ENCOUNTER — Other Ambulatory Visit: Payer: Self-pay | Admitting: Nurse Practitioner

## 2014-10-25 ENCOUNTER — Encounter (HOSPITAL_COMMUNITY)
Admission: RE | Disposition: A | Discharge status: Home / Self Care | Payer: Self-pay | Source: Ambulatory Visit | Attending: Vascular Surgery

## 2014-10-25 ENCOUNTER — Ambulatory Visit (HOSPITAL_COMMUNITY)
Admission: RE | Admit: 2014-10-25 | Discharge: 2014-10-25 | Disposition: A | Discharge status: Home / Self Care | Payer: Medicare Other | Source: Ambulatory Visit | Attending: Vascular Surgery | Admitting: Vascular Surgery

## 2014-10-25 DIAGNOSIS — I739 Peripheral vascular disease, unspecified: Secondary | ICD-10-CM | POA: Diagnosis present

## 2014-10-25 DIAGNOSIS — I5032 Chronic diastolic (congestive) heart failure: Secondary | ICD-10-CM | POA: Diagnosis not present

## 2014-10-25 DIAGNOSIS — Z95820 Peripheral vascular angioplasty status with implants and grafts: Secondary | ICD-10-CM | POA: Diagnosis not present

## 2014-10-25 DIAGNOSIS — I252 Old myocardial infarction: Secondary | ICD-10-CM | POA: Insufficient documentation

## 2014-10-25 DIAGNOSIS — I2510 Atherosclerotic heart disease of native coronary artery without angina pectoris: Secondary | ICD-10-CM | POA: Diagnosis not present

## 2014-10-25 DIAGNOSIS — E119 Type 2 diabetes mellitus without complications: Secondary | ICD-10-CM | POA: Insufficient documentation

## 2014-10-25 DIAGNOSIS — I70201 Unspecified atherosclerosis of native arteries of extremities, right leg: Secondary | ICD-10-CM | POA: Diagnosis not present

## 2014-10-25 DIAGNOSIS — Z89421 Acquired absence of other right toe(s): Secondary | ICD-10-CM | POA: Diagnosis not present

## 2014-10-25 DIAGNOSIS — Z7982 Long term (current) use of aspirin: Secondary | ICD-10-CM | POA: Insufficient documentation

## 2014-10-25 DIAGNOSIS — N184 Chronic kidney disease, stage 4 (severe): Secondary | ICD-10-CM | POA: Diagnosis not present

## 2014-10-25 DIAGNOSIS — Z794 Long term (current) use of insulin: Secondary | ICD-10-CM | POA: Insufficient documentation

## 2014-10-25 DIAGNOSIS — I129 Hypertensive chronic kidney disease with stage 1 through stage 4 chronic kidney disease, or unspecified chronic kidney disease: Secondary | ICD-10-CM | POA: Diagnosis not present

## 2014-10-25 DIAGNOSIS — Z79891 Long term (current) use of opiate analgesic: Secondary | ICD-10-CM | POA: Diagnosis not present

## 2014-10-25 DIAGNOSIS — J45909 Unspecified asthma, uncomplicated: Secondary | ICD-10-CM | POA: Diagnosis not present

## 2014-10-25 DIAGNOSIS — E785 Hyperlipidemia, unspecified: Secondary | ICD-10-CM | POA: Insufficient documentation

## 2014-10-25 DIAGNOSIS — Z9861 Coronary angioplasty status: Secondary | ICD-10-CM | POA: Diagnosis not present

## 2014-10-25 HISTORY — PX: ABDOMINAL AORTAGRAM: SHX5454

## 2014-10-25 LAB — POCT I-STAT, CHEM 8
BUN: 56 mg/dL — ABNORMAL HIGH (ref 6–23)
Calcium, Ion: 1.18 mmol/L (ref 1.13–1.30)
Chloride: 108 mEq/L (ref 96–112)
Creatinine, Ser: 2.8 mg/dL — ABNORMAL HIGH (ref 0.50–1.10)
GLUCOSE: 151 mg/dL — AB (ref 70–99)
HEMATOCRIT: 36 % (ref 36.0–46.0)
HEMOGLOBIN: 12.2 g/dL (ref 12.0–15.0)
POTASSIUM: 4 meq/L (ref 3.7–5.3)
SODIUM: 140 meq/L (ref 137–147)
TCO2: 20 mmol/L (ref 0–100)

## 2014-10-25 LAB — GLUCOSE, CAPILLARY: Glucose-Capillary: 169 mg/dL — ABNORMAL HIGH (ref 70–99)

## 2014-10-25 SURGERY — ABDOMINAL AORTAGRAM
Anesthesia: LOCAL

## 2014-10-25 MED ORDER — FENTANYL CITRATE 0.05 MG/ML IJ SOLN
INTRAMUSCULAR | Status: AC
  Filled 2014-10-25: qty 2

## 2014-10-25 MED ORDER — LIDOCAINE HCL (PF) 1 % IJ SOLN
INTRAMUSCULAR | Status: AC
  Filled 2014-10-25: qty 30

## 2014-10-25 MED ORDER — MIDAZOLAM HCL 2 MG/2ML IJ SOLN
INTRAMUSCULAR | Status: AC
  Filled 2014-10-25: qty 2

## 2014-10-25 MED ORDER — SODIUM CHLORIDE 0.9 % IV SOLN
INTRAVENOUS | Status: DC
  Administered 2014-10-25: 06:00:00 via INTRAVENOUS

## 2014-10-25 MED ORDER — LABETALOL HCL 5 MG/ML IV SOLN: 10.0000 mg | INTRAVENOUS | Status: DC | PRN

## 2014-10-25 MED ORDER — SODIUM CHLORIDE 0.9 % IV SOLN: 1.0000 mL/kg/h | INTRAVENOUS | Status: DC

## 2014-10-25 MED ORDER — HEPARIN (PORCINE) IN NACL 2-0.9 UNIT/ML-% IJ SOLN
INTRAMUSCULAR | Status: AC
  Filled 2014-10-25: qty 1000

## 2014-10-25 NOTE — Interval H&P Note (Signed)
History and Physical Interval Note:  10/25/2014 7:39 AM  Dorothy Bartlett  has presented today for surgery, with the diagnosis of pvd  The various methods of treatment have been discussed with the patient and family. After consideration of risks, benefits and other options for treatment, the patient has consented to  Procedure(s): ABDOMINAL AORTAGRAM (N/A) as a surgical intervention .  The patient's history has been reviewed, patient examined, no change in status, stable for surgery.  I have reviewed the patient's chart and labs.  Questions were answered to the patient's satisfaction.     Elwood Bazinet S

## 2014-10-25 NOTE — Progress Notes (Signed)
Relieved by Amaryllis DykeJan J

## 2014-10-25 NOTE — Progress Notes (Signed)
Site area: lt femoral artery Site Prior to Removal:  Level 0 Pressure Applied For:20 minutes Manual:   yes Patient Status During Pull:  Pain free Post Pull Site:  Level 0 Post Pull Instructions Given: by Harriette Oharajan Dacia Capers rtr  Post Pull Pulses Present: pt dopple Dressing Applied: yes  Bedrest begins @ 09:25 Comments:

## 2014-10-25 NOTE — Progress Notes (Signed)
Patient ID: Dorothy Bartlett, female   DOB: 05/16/1926, 80 y.o.   MRN: [id number] Reviewed CO2 angiogram and discussed with Dr. Edilia Boickson. Examine the patient.  2-3+ subcuticular graft pulse. She has very strong biphasic signal at the ankle. Peroneal artery.  She did have an eschar over the third toe amputation site and I debrided this at the bedside with a very small residual open area with good granulation tissue.  Patient does have a patent AV fistulas which are small and her in situ graft. I do not feel that this is causing any flow limitation to her foot. Suspect that the duplex in the office simply could not save the anastomosis stenosis is clearly widely patent which was a concern for today. We'll continue to follow in the office as before. Discussed at length with the patient who understands

## 2014-10-25 NOTE — Progress Notes (Signed)
Notified Dr Edilia Boickson of Istat results this am Cr 2.8, ok to proceed will use CO2 for procedure

## 2014-10-25 NOTE — H&P (View-Only) (Signed)
[facility] [place] HISTORY AND PHYSICAL -PAD  History of Present Illness Dorothy Bartlett is a 80 y.o. female patient od Dr. Arbie CookeyEarly who is s/p right femoral to peroneal bypass and amputation of her right third toe on 08/04/14.  She returns today with c/o discoloration of right 3rd toe amputation site, 2 wks.  The patient denies pain, denies fever or chills, denies drainage at this site.  Discussed with Dr. Darrick PennaFields yesterday, he requested an arterial US of the right LE to see if the graft is open.   Pt Diabetic: Yes  Pt smoker: non-smoker  Pt meds include:  Statin :Yes  Betablocker: Yes  ASA: Yes  Other anticoagulants/antiplatelets: no   Past Medical History  Diagnosis Date  . MI (myocardial infarction) 1995  . Asthma   . Hx of echocardiogram   . Chronic diastolic CHF (congestive heart failure)     a. Echocardiogram (04/2005):EF 55-65%, moderate LVH, mild aortic stenosis (mean gradient 9), moderate LAE.  [name]. Hyperlipidemia   . PAD (peripheral artery disease)   . Carotid stenosis     Carotid US (12/2013): < 60% RCA, < 40% LICA.  [name]. Type II diabetes mellitus   . Arthritis   . PVD (peripheral vascular disease)   . CKD (chronic kidney disease), stage IV     notes 02/22/2014; Dr. Hyman HopesWebb  . Hypertension   . Coronary artery disease     a. s/p MI 1995 tx w/ PTCA to Dx, Ramus, ap LAD;  b. LHC (11/2004):  Inferior HK, EF 60%, proximal LAD 50%, ostial D1 80%, mid D1 95%, mid LAD 50-60%, distal LAD at the apex 90%, OM1 occluded (fills late by left to left collaterals), RCA 75%. Medical therapy recommended.;  c. Lexiscan Myoview (12/2013):  Prior ant-lat scar with very small area of peri-infarct ischemia in ant wall, EF 40 (high risk)     Social History History  Substance Use Topics  . Smoking status: Never Smoker   . Smokeless tobacco: Never Used  . Alcohol Use: No    Family History Family History  Problem Relation Age of Onset  . Diabetes Father   . Hypertension Father    . Hypertension Daughter   . Hyperlipidemia Daughter   . Hyperlipidemia Son   . Hypertension Sister     Past Surgical History  Procedure Laterality Date  . Repair of nerve in left arm  Left 2000  . Cardiac catheterization  1995    w/ PTCA Diag 2nd MI  . Femoral-tibial bypass graft Left 02/24/2014    Procedure: BYPASS GRAFT FEMORAL-TIBIAL ARTERY;  Surgeon: Larina Earthlyodd F Early, MD;  [facility];  Service: Vascular;  Laterality: Left;  . Femoral-popliteal bypass graft Right 08/04/2014    Procedure: RIGHT FEMORAL-PERONEAL ARTERY BYPASS GRAFT;  Surgeon: Larina Earthlyodd F Early, MD;  [facility];  Service: Vascular;  Laterality: Right;  . Amputation Right 08/04/2014    Procedure: AMPUTATION DIGIT-RIGHT 3RD TOE;  Surgeon: Larina Earthlyodd F Early, MD;  [facility];  Service: Vascular;  Laterality: Right;    No Known Allergies  Current Outpatient Prescriptions  Medication Sig Dispense Refill  . albuterol (PROVENTIL HFA;VENTOLIN HFA) 108 (90 BASE) MCG/ACT inhaler Inhale 2 puffs into the lungs every 6 (six) hours as needed for wheezing or shortness of breath.      [name]. aspirin 81 MG tablet Take 81 mg by mouth every morning.       [name]. atorvastatin (LIPITOR) 40 MG tablet Take 40 mg by mouth  every morning.       . B Complex-C-Folic Acid (RENA-VITE RX) 1 MG TABS Take 1 tablet by mouth every morning.       . carvedilol (COREG) 12.5 MG tablet Take 1 tablet (12.5 mg total) by mouth 2 (two) times daily.  180 tablet  1  . docusate sodium (COLACE) 100 MG capsule Take 100 mg by mouth at bedtime.       [name] doxycycline (DORYX) 100 MG DR capsule Take 100 mg by mouth 2 (two) times daily. Starting 7/17 for 10 days      . furosemide (LASIX) 40 MG tablet Take 1 tablet (40 mg total) by mouth daily.      . hydrALAZINE (APRESOLINE) 10 MG tablet Take 10 mg by mouth 3 (three) times daily.      . insulin lispro (HUMALOG) 100 UNIT/ML injection Inject 5 Units into the skin 2 (two) times daily as needed for high blood sugar. Only takes if CBG >250.       [name] isosorbide mononitrate (IMDUR) 30 MG 24 hr tablet Take 1 tablet (30 mg total) by mouth daily.  30 tablet  0  . Melatonin 3 MG TABS Take 3 mg by mouth at bedtime as needed (for sleep).       [name] DELICA LANCETS 33G MISC       . ONETOUCH VERIO test strip       . oxyCODONE (ROXICODONE) 5 MG immediate release tablet Take one tablet by mouth every 6 hours as needed for severe pain  120 tablet  0  . paricalcitol (ZEMPLAR) 1 MCG capsule Take 1 mcg by mouth daily.       . pregabalin (LYRICA) 50 MG capsule Take one capsule by mouth once daily for pains  30 capsule  5  . sitaGLIPtin (JANUVIA) 25 MG tablet Take 25 mg by mouth daily.       No current facility-administered medications for this visit.    ROS: See HPI for pertinent positives and negatives.   Physical Examination  Amputation site of right third toe has what appears to be a small accumulation of necrotic tissue.  Dorsal aspect of left toes are dusky.    Non-Invasive Vascular Imaging: DATE: 10/21/2014  LOWER EXTREMITY ARTERIAL DUPLEX EVALUATION     INDICATION:  Right lower extremity peripheral vascular disease     PREVIOUS INTERVENTION(S):  Right femoral to peroneal artery bypass graft 08/04/2014.     DUPLEX EXAM:      RIGHT   LEFT   Peak Systolic Velocity (cm/s)  Ratio (if abnormal)  Waveform   Peak Systolic Velocity (cm/s)  Ratio (if abnormal)  Waveform   188/206   T/T  Inflow Artery      206   T  Proximal Anastomosis      103   T  Proximal Graft      84   B  Mid Graft      76   B  Distal Graft      68   B  Distal Anastomosis      0   Occluded  Outflow Artery      NA  Today's ABI / TBI  NA   0.56  Previous ABI / TBI (07/27/2014 )  0.38/[place]     Waveform: M - Monophasic B - Biphasic T - Triphasic  If Ankle Brachial Index (ABI) or Toe Brachial Index (TBI) performed, please see complete report   ADDITIONAL FINDINGS:  IMPRESSION:  Patent arterial inflow with mildly elevated velocities, however no  hemodynamically significant plaque is visualized.  Patent right superficial femoral to peroneal artery bypass graft.  No color or spectral Doppler waveform could be obtained suggestive of vessel occlusion of the right peroneal artery at the bypass graft distal anastomosis.     Compared to the previous exam:  No Duplex since surgery to compare.       ASSESSMENT:  Dorothy Bartlett is a 80 y.o. female who is s/p right femoral to peroneal bypass and amputation of her right third toe on 08/04/14.  She returns today with c/o discoloration of right 3rd toe amputation site, for 2 weeks.  The patient denies pain, denies fever or chills, denies drainage at this site.  Discussed with Dr. Darrick PennaFields yesterday, he requested an arterial US of the right LE to see if the graft is open.    10/21/14 right lower extremity arterial Duplex:  Patent arterial inflow with mildly elevated velocities, however no hemodynamically significant plaque is visualized.  Patent right superficial femoral to peroneal artery bypass graft.  No color or spectral Doppler waveform could be obtained suggestive of vessel occlusion of the right peroneal artery at the bypass graft distal anastomosis.  PLAN:  Based on the patient's vascular studies and examination, and after discussing with Dr. Imogene Burnhen, the patient will be scheduled for an arteriogram with bilateral runoff, attention to right LE distal graft occlusion, Monday, 10/25/14, with Dr. Edilia Boickson.  Charisse MarchSuzanne Nickel, RN, MSN, FNP-C [facility] [place] Office Phone: [phone]  Clinic MD: Imogene BurnChen  10/22/2014 12:03 PM

## 2014-10-25 NOTE — Discharge Instructions (Signed)

## 2014-10-25 NOTE — Progress Notes (Signed)
Up in room and tol well; left groin stable; no bleeding or hematoma

## 2014-10-25 NOTE — Op Note (Signed)
PATIENT: Dorothy Bartlett   MRN: [id number] DOB: April 26, 1926    DATE OF PROCEDURE: 10/25/2014  INDICATIONS: Dorothy FraiseCorine H Sanabia is a 80 y.o. female who was seen in the office on 10/22/2014 by Rosalita ChessmanSuzanne Nickel. She has undergone a left femoral to posterior tibial artery bypass with an in situ saphenous vein graft in February 2015 by Dr. Arbie CookeyEarly. More recently, she underwent a right femoral to peroneal artery bypass with an in situ saphenous vein graft and a right third toe amputation. There were some concerns about the third toe amputation on the right healing adequately. Duplex scan on the right suggested that the fem peroneal bypass graft was patent. However, there was no visualization of flow in the peroneal artery distally. This reason she was set up for an arteriogram.  She has stage IV chronic kidney disease. In addition she has diabetes. This reason I elected to do the procedure using CO2 only.  PROCEDURE:  1. Ultrasound-guided access to the left common femoral artery 2. Aortogram and bilateral iliac arteriogram using CO2 3. Selective catheterization of the right external iliac artery with right lower extremity runoff using CO2 4. Selective catheterization of the right femoral to peroneal artery bypass graft with right lower extremity runoff using CO2 5. Retrograde left femoral arteriogram with left lower extremity runoff using CO2  SURGEON: Di Kindlehristopher S. Edilia Boickson, MD, FACS  ANESTHESIA: local with sedation   EBL: minimal  TECHNIQUE: The patient was taken to the peripheral vascular lab. She received half a milligram of Versed and 25 g of fentanyl. Both groins were prepped and draped in usual sterile fashion. Under ultrasound guidance, after the skin was anesthetized, the left common femoral artery was cannulated above her bypass graft using a micropuncture needle. A micropuncture sheath was introduced over the wire. The micropuncture sheath was exchanged for a 5 [place]French sheath over a  Versacore wire.  A pigtail catheter was positioned at the L1 vertebral body and flush aortogram obtained. The catheter was positioned above the aortic bifurcation and the pigtail catheter exchanged for a crossover catheter which was positioned into the proximal right common iliac artery. The wire was advanced into the external iliac artery and the crossover catheter exchanged for a straight catheter. Selective right external iliac arteriogram was obtained with right lower extremity runoff. In order to get better visualization of the tibial vessels, I advanced the wire into the bypass graft using the crossover catheter as a directional catheter. I was able to cannulate the graft and advanced a straight catheter into the graft for additional spot films of the right leg. The straight catheter was then removed and an additional retrograde left femoral arteriogram obtained with left lower extremity runoff. At the completion of the procedure. The patient was transferred to the holding area for removal of the sheath.  FINDINGS:  1. Single renal arteries bilaterally with no significant renal artery stenosis identified. 2. No significant disease of the infrarenal aorta, bilateral common iliac arteries, bilateral external iliac arteries, or hypogastric arteries bilaterally. 3. On the right side, the common femoral and deep femoral artery are patent. The superficial femoral artery is patent proximally is occluded in the proximal thigh with reconstitution of a diseased popliteal artery. The anterior tibial and posterior tibial arteries are occluded. There is reconstitution of the posterior tibial artery at the ankle. The peroneal artery reconstitutes distally. The right femoral to peroneal artery bypass graft is patent. This was done in situ and there appear to be at least 2 AV fistulas  present. One is clearly identified in the distal thigh. It also appears to be one in the proximal graft. 4. On the left side, the common femoral and deep  femoral artery are patent. There is moderate disease of the proximal superficial femoral artery which is then occluded distally. The intra-tibial peroneal and tibial peroneal trunks are occluded. The posterior tibial artery reconstitutes on the left. The left femoral to posterior tibial artery bypass graft is patent with a proximal stenosis of approximately 70%.  Waverly Ferrarihristopher Robbi Scurlock, MD, FACS [facility]  DATE OF DICTATION:   10/25/2014

## 2014-10-29 ENCOUNTER — Encounter (HOSPITAL_COMMUNITY)
Admission: RE | Admit: 2014-10-29 | Discharge: 2014-10-29 | Disposition: A | Discharge status: Home / Self Care | Payer: Medicare Other | Source: Ambulatory Visit | Attending: Nephrology | Admitting: Nephrology

## 2014-10-29 DIAGNOSIS — D631 Anemia in chronic kidney disease: Secondary | ICD-10-CM | POA: Diagnosis not present

## 2014-10-29 MED ORDER — DARBEPOETIN ALFA-POLYSORBATE 40 MCG/0.4ML IJ SOLN
INTRAMUSCULAR | Status: AC
  Filled 2014-10-29: qty 0.4

## 2014-10-29 MED ORDER — DARBEPOETIN ALFA-POLYSORBATE 40 MCG/0.4ML IJ SOLN
40.0000 ug | INTRAMUSCULAR | Status: DC
  Administered 2014-10-29: 40 ug via SUBCUTANEOUS

## 2014-11-03 ENCOUNTER — Telehealth: Payer: Self-pay | Admitting: Vascular Surgery

## 2014-11-03 NOTE — Telephone Encounter (Addendum)
-----   Message from Erenest Blankarol Sue Pullins, RN sent at 11/02/2014  1:13 PM EST ----- Regarding: change f/u appt.  Contact: [phone] Please change pt's appts. from 11/30/14 to a 3 mo. f/u.  Pt. just had a right LE Arterial Duplex, and an Angiogram.  Per Dr. Arbie CookeyEarly, she is stable and the BPG is patent.  The daughter, Harriett Sineancy, is aware that the appt., more than likely, would change.  She asked if you can't reach her on the cell, to leave a voice message with changes.     11/03/14: spoke with pts daughter nancy to r.s, dpm

## 2014-11-05 ENCOUNTER — Encounter: Payer: Self-pay | Admitting: Podiatry

## 2014-11-05 ENCOUNTER — Ambulatory Visit (INDEPENDENT_AMBULATORY_CARE_PROVIDER_SITE_OTHER): Payer: 59 | Admitting: Podiatry

## 2014-11-05 VITALS — BP 172/79 | HR 73

## 2014-11-05 DIAGNOSIS — I739 Peripheral vascular disease, unspecified: Secondary | ICD-10-CM

## 2014-11-05 DIAGNOSIS — B351 Tinea unguium: Secondary | ICD-10-CM

## 2014-11-05 DIAGNOSIS — M79606 Pain in leg, unspecified: Secondary | ICD-10-CM

## 2014-11-05 NOTE — Progress Notes (Signed)
Subjective:  80 year old female presents accompanied by her daughter, requesting toe nails trimmed. Last August had done a bypass surgery and removal of 3rd toe right.  Objective: Vascular: Pedal pulses are not palpable. Excess foot and leg edema bilateral. Old surgical incision line on both lower limb. Dermatologic: Discolored and hypertrophic nails x 9. Neurologic: All epicritic sensations grossly intact with hyperalgic foot left.  Orthopedic: Status post removal of 3rd digit right. Severe STJ pronation with flattening arch upon weight bearing.   Assessment:  Status post vascular reconstruction bilateral lower limb.  Pedal edema bilateral. STJ hyper pronation with flattening arch upon weight bearn.  Deformed mycotic nails x 9.  Plan:  All nails debrided. Return in 3 month or as needed.

## 2014-11-05 NOTE — Patient Instructions (Signed)
Seen for hypertrophic nails. All nails debrided. Return in 3 months or as needed.  

## 2014-11-23 ENCOUNTER — Other Ambulatory Visit (HOSPITAL_COMMUNITY): Payer: Medicare Other

## 2014-11-23 ENCOUNTER — Telehealth: Payer: Self-pay | Admitting: Interventional Cardiology

## 2014-11-23 ENCOUNTER — Encounter (HOSPITAL_COMMUNITY): Payer: Medicare Other

## 2014-11-23 ENCOUNTER — Ambulatory Visit: Payer: Medicare Other | Admitting: Vascular Surgery

## 2014-11-23 NOTE — Telephone Encounter (Signed)
New Msg   Patient daughter calling stating patient is on 40 mg of Lasix and has lost a lot of weight and now wets herself constantly. Daughter concerned about prescription dosage being too high. Please contact at [phone].

## 2014-11-23 NOTE — Telephone Encounter (Signed)
Fluid med at current dose is important to keep fluid from building in lungs. Need to see urologist about incontinence.

## 2014-11-23 NOTE — Telephone Encounter (Signed)
Returned pt daughter call. Harriett Sineancy reports that pt has been having an ongoing issue with incontinence. Pt refuses to wear depends.pt pcp suggested cutting her lasix down to 20mg  daily,but wants to defer to pt cardiologist to make that recommendation.pt weight is stable, she has a [employer] scale and weighs daily, her weight has stayed between 178-180lb. Karen KaysAdv Nancy that Dr.Smith is out of the office, I will fwd him a message and call back with his recommendation.she verbalized understanding.

## 2014-11-29 ENCOUNTER — Encounter (HOSPITAL_COMMUNITY)
Admission: RE | Admit: 2014-11-29 | Discharge: 2014-11-29 | Disposition: A | Discharge status: Home / Self Care | Payer: Medicare Other | Source: Ambulatory Visit | Attending: Nephrology | Admitting: Nephrology

## 2014-11-29 DIAGNOSIS — N183 Chronic kidney disease, stage 3 (moderate): Secondary | ICD-10-CM | POA: Diagnosis not present

## 2014-11-29 DIAGNOSIS — D631 Anemia in chronic kidney disease: Secondary | ICD-10-CM | POA: Insufficient documentation

## 2014-11-29 MED ORDER — DARBEPOETIN ALFA 40 MCG/0.4ML IJ SOSY
PREFILLED_SYRINGE | INTRAMUSCULAR | Status: AC
  Filled 2014-11-29: qty 0.4

## 2014-11-29 MED ORDER — DARBEPOETIN ALFA 40 MCG/0.4ML IJ SOSY
40.0000 ug | PREFILLED_SYRINGE | INTRAMUSCULAR | Status: DC
  Administered 2014-11-29: 40 ug via SUBCUTANEOUS

## 2014-11-30 ENCOUNTER — Encounter (HOSPITAL_COMMUNITY): Payer: Medicare Other

## 2014-11-30 ENCOUNTER — Ambulatory Visit: Payer: Medicare Other | Admitting: Vascular Surgery

## 2014-11-30 ENCOUNTER — Other Ambulatory Visit (HOSPITAL_COMMUNITY): Payer: Medicare Other

## 2014-12-01 NOTE — Telephone Encounter (Signed)
pt daughter aware of Dr.Smith's recommendations.Fluid med at current dose is important to keep fluid from building in lungs. Need to see urologist about incontinence.pt daughter verbalized understanding.

## 2014-12-09 ENCOUNTER — Encounter (HOSPITAL_COMMUNITY): Payer: Self-pay | Admitting: Vascular Surgery

## 2014-12-09 ENCOUNTER — Encounter: Payer: Self-pay | Admitting: Vascular Surgery

## 2014-12-27 ENCOUNTER — Encounter (HOSPITAL_COMMUNITY)
Admission: RE | Admit: 2014-12-27 | Discharge: 2014-12-27 | Disposition: A | Discharge status: Home / Self Care | Payer: Medicare Other | Source: Ambulatory Visit | Attending: Nephrology | Admitting: Nephrology

## 2014-12-27 DIAGNOSIS — N183 Chronic kidney disease, stage 3 (moderate): Secondary | ICD-10-CM | POA: Diagnosis not present

## 2014-12-27 DIAGNOSIS — D631 Anemia in chronic kidney disease: Secondary | ICD-10-CM | POA: Diagnosis present

## 2014-12-27 MED ORDER — DARBEPOETIN ALFA 40 MCG/0.4ML IJ SOSY
40.0000 ug | PREFILLED_SYRINGE | INTRAMUSCULAR | Status: DC
  Administered 2014-12-27: 40 ug via SUBCUTANEOUS

## 2014-12-27 MED ORDER — DARBEPOETIN ALFA 40 MCG/0.4ML IJ SOSY
PREFILLED_SYRINGE | INTRAMUSCULAR | Status: AC
  Administered 2014-12-27: 40 ug via SUBCUTANEOUS
  Filled 2014-12-27: qty 0.4

## 2015-01-27 ENCOUNTER — Inpatient Hospital Stay (HOSPITAL_COMMUNITY): Admission: RE | Admit: 2015-01-27 | Payer: Medicare Other | Source: Ambulatory Visit

## 2015-02-04 ENCOUNTER — Encounter: Payer: Self-pay | Admitting: Podiatry

## 2015-02-04 ENCOUNTER — Ambulatory Visit (INDEPENDENT_AMBULATORY_CARE_PROVIDER_SITE_OTHER): Payer: Medicare Other | Admitting: Podiatry

## 2015-02-04 VITALS — BP 161/72 | HR 58

## 2015-02-04 DIAGNOSIS — B351 Tinea unguium: Secondary | ICD-10-CM

## 2015-02-04 DIAGNOSIS — M79606 Pain in leg, unspecified: Secondary | ICD-10-CM

## 2015-02-04 NOTE — Patient Instructions (Signed)
Seen for hypertrophic nails. All nails debrided. Return in 3 months or as needed.  

## 2015-02-04 NOTE — Progress Notes (Signed)
Subjective:  81 year old female presents accompanied by her daughter, requesting toe nails trimmed.  Objective: Vascular: Pedal pulses are not palpable. Excess foot and leg edema bilateral. Old surgical incision line on both lower limb. Dermatologic: Discolored and hypertrophic nails x 9. Neurologic: All epicritic sensations grossly intact with hyperalgic foot left.  Orthopedic: Status post removal of 3rd digit right. Severe STJ pronation with flattening arch upon weight bearing.   Assessment:  Status post vascular reconstruction bilateral lower limb.  Pedal edema bilateral. STJ hyper pronation with flattening arch upon weight bearn.  Deformed mycotic nails x 9.  Plan:  All nails debrided. Return in 3 month or as needed.

## 2015-02-07 ENCOUNTER — Encounter: Payer: Self-pay | Admitting: Vascular Surgery

## 2015-02-08 ENCOUNTER — Ambulatory Visit: Payer: Medicare Other | Admitting: Vascular Surgery

## 2015-02-08 ENCOUNTER — Other Ambulatory Visit (HOSPITAL_COMMUNITY): Payer: Medicare Other

## 2015-02-08 ENCOUNTER — Encounter (HOSPITAL_COMMUNITY): Payer: Medicare Other

## 2015-02-10 ENCOUNTER — Encounter: Payer: Self-pay | Admitting: Interventional Cardiology

## 2015-02-10 ENCOUNTER — Ambulatory Visit (INDEPENDENT_AMBULATORY_CARE_PROVIDER_SITE_OTHER): Payer: Medicare Other | Admitting: Interventional Cardiology

## 2015-02-10 VITALS — BP 112/68 | HR 56 | Ht 59.0 in | Wt 178.0 lb

## 2015-02-10 DIAGNOSIS — N184 Chronic kidney disease, stage 4 (severe): Secondary | ICD-10-CM

## 2015-02-10 DIAGNOSIS — I2510 Atherosclerotic heart disease of native coronary artery without angina pectoris: Secondary | ICD-10-CM

## 2015-02-10 DIAGNOSIS — I5042 Chronic combined systolic (congestive) and diastolic (congestive) heart failure: Secondary | ICD-10-CM

## 2015-02-10 DIAGNOSIS — I739 Peripheral vascular disease, unspecified: Secondary | ICD-10-CM

## 2015-02-10 DIAGNOSIS — I252 Old myocardial infarction: Secondary | ICD-10-CM | POA: Insufficient documentation

## 2015-02-10 DIAGNOSIS — I10 Essential (primary) hypertension: Secondary | ICD-10-CM

## 2015-02-10 NOTE — Progress Notes (Signed)
Patient ID: Dorothy Bartlett, female   DOB: 15-Apr-1926, 81 y.o.   MRN: [id number]    Cardiology Office Note   Date:  02/10/2015   ID:  Dorothy FraiseCorine H Bartlett, DOB 15-Apr-1926, MRN [id number]  PCP:  Dorothy Bartlett, CANDACE, DO  Cardiologist:   Lesleigh NoeSMITH III,HENRY W, MD   No chief complaint on file.     History of Present Illness: Dorothy Rushie ChestnutH Uselman is a 81 y.o. female who presents for follow-up of coronary artery disease. There is also a history of combined chronic systolic and diastolic heart failure. She denies angina and dyspnea. There is no lower extremity swelling. Lower extremity pain at rest has completely resolved after revascularization by Dr. Tawanna Coolerodd early. She sleeps well. She denies orthopnea. No chills or fever.    Past Medical History  Diagnosis Date  . MI (myocardial infarction) 1995  . Asthma   . Hx of echocardiogram   . Chronic diastolic CHF (congestive heart failure)     a. Echocardiogram (04/2005):EF 55-65%, moderate LVH, mild aortic stenosis (mean gradient 9), moderate LAE.  [name]. Hyperlipidemia   . PAD (peripheral artery disease)   . Carotid stenosis     Carotid US (12/2013): < 60% RCA, < 40% LICA.  [name]. Type II diabetes mellitus   . Arthritis   . PVD (peripheral vascular disease)   . CKD (chronic kidney disease), stage IV     notes 02/22/2014; Dr. Hyman HopesWebb  . Hypertension   . Coronary artery disease     a. s/p MI 1995 tx w/ PTCA to Dx, Ramus, ap LAD;  b. LHC (11/2004):  Inferior HK, EF 60%, proximal LAD 50%, ostial D1 80%, mid D1 95%, mid LAD 50-60%, distal LAD at the apex 90%, OM1 occluded (fills late by left to left collaterals), RCA 75%. Medical therapy recommended.;  c. Lexiscan Myoview (12/2013):  Prior ant-lat scar with very small area of peri-infarct ischemia in ant wall, EF 40 (high risk)     Past Surgical History  Procedure Laterality Date  . Repair of nerve in left arm  Left 2000  . Cardiac catheterization  1995    w/ PTCA Diag 2nd MI  . Femoral-tibial bypass graft Left 02/24/2014   Procedure: BYPASS GRAFT FEMORAL-TIBIAL ARTERY;  Surgeon: Larina Earthlyodd F Early, MD;  [facility];  Service: Vascular;  Laterality: Left;  . Femoral-popliteal bypass graft Right 08/04/2014    Procedure: RIGHT FEMORAL-PERONEAL ARTERY BYPASS GRAFT;  Surgeon: Larina Earthlyodd F Early, MD;  [facility];  Service: Vascular;  Laterality: Right;  . Amputation Right 08/04/2014    Procedure: AMPUTATION DIGIT-RIGHT 3RD TOE;  Surgeon: Larina Earthlyodd F Early, MD;  [facility];  Service: Vascular;  Laterality: Right;  . Lower extremity angiogram Left 01/08/2014    Procedure: LOWER EXTREMITY ANGIOGRAM;  Surgeon: Sherren Kernsharles E Fields, MD;  [facility];  Service: Cardiovascular;  Laterality: Left;  . Abdominal angiogram  01/08/2014    Procedure: ABDOMINAL ANGIOGRAM;  Surgeon: Sherren Kernsharles E Fields, MD;  [facility];  Service: Cardiovascular;;  . Lower extremity angiogram Right 08/03/2014    Procedure: LOWER EXTREMITY ANGIOGRAM;  Surgeon: Nada LibmanVance W Brabham, MD;  [facility];  Service: Cardiovascular;  Laterality: Right;  . Abdominal aortagram N/A 10/25/2014    Procedure: ABDOMINAL Ronny FlurryAORTAGRAM;  Surgeon: Chuck Hinthristopher S Dickson, MD;  [facility];  Service: Cardiovascular;  Laterality: N/A;     Current Outpatient Prescriptions  Medication Sig Dispense Refill  . albuterol (PROVENTIL HFA;VENTOLIN HFA) 108 (90 BASE) MCG/ACT inhaler  Inhale 2 puffs into the lungs every 6 (six) hours as needed for wheezing or shortness of breath.    [name] aspirin 81 MG tablet Take 81 mg by mouth every morning.     [name] atorvastatin (LIPITOR) 40 MG tablet Take 40 mg by mouth every morning.     . B Complex-C-Folic Acid (RENA-VITE RX) 1 MG TABS Take 1 tablet by mouth every morning.     . carvedilol (COREG) 12.5 MG tablet Take 1 tablet (12.5 mg total) by mouth 2 (two) times daily. 180 tablet 1  . docusate sodium (COLACE) 100 MG capsule Take 100 mg by mouth 2 (two) times daily.     . furosemide (LASIX) 40 MG tablet Take 1 tablet (40 mg total) by  mouth daily.    . hydrALAZINE (APRESOLINE) 10 MG tablet Take 10 mg by mouth 3 (three) times daily.    . insulin lispro (HUMALOG) 100 UNIT/ML injection Inject 5 Units into the skin 2 (two) times daily as needed for high blood sugar. Only takes if CBG >250.    [name] isosorbide mononitrate (IMDUR) 30 MG 24 hr tablet Take 1 tablet (30 mg total) by mouth daily. 30 tablet 0  . Melatonin 3 MG TABS Take 3 mg by mouth at bedtime as needed (for sleep).     . paricalcitol (ZEMPLAR) 1 MCG capsule Take 1 mcg by mouth daily.     . sitaGLIPtin (JANUVIA) 25 MG tablet Take 25 mg by mouth daily.     No current facility-administered medications for this visit.    Allergies:   Review of patient's allergies indicates no known allergies.    Social History:  The patient  reports that she has never smoked. She has never used smokeless tobacco. She reports that she does not drink alcohol or use illicit drugs.   Family History:  The patient's family history includes Diabetes in her father; Hyperlipidemia in her daughter and son; Hypertension in her daughter, father, and sister.    ROS:  Please see the history of present illness.   Otherwise, review of systems are positive for none.   All other systems are reviewed and negative.    PHYSICAL EXAM: VS:  BP 112/68 mmHg  Pulse 56  Ht  (1.499 m)  Wt 178 lb (80.74 kg)  BMI 35.93 kg/m2  SpO2 99% , BMI Body mass index is 35.93 kg/(m^2). GEN: Well nourished, well developed, in no acute distress HEENT: normal Neck: no JVD, carotid bruits, or masses Cardiac: RRR; no murmurs, rubs, or gallops,no edema  Respiratory:  clear to auscultation bilaterally, normal work of breathing GI: soft, nontender, nondistended, + BS MS: no deformity or atrophy Skin: warm and dry, no rash Neuro:  Strength and sensation are intact Psych: euthymic mood, full affect   EKG:  EKG is not ordered today. The ekg ordered today demonstrates    Recent Labs: 04/26/2014: Pro B Natriuretic  peptide (BNP) 2065.0* 08/04/2014: ALT 13 08/08/2014: Platelets 128* 10/25/2014: BUN 56*; Creatinine 2.80*; Hemoglobin 12.2; Potassium 4.0; Sodium 140    Lipid Panel No results found for: CHOL, TRIG, HDL, CHOLHDL, VLDL, LDLCALC, LDLDIRECT    Wt Readings from Last 3 Encounters:  02/10/15 178 lb (80.74 kg)  10/25/14 160 lb (72.576 kg)  10/21/14 184 lb (83.462 kg)      Other studies Reviewed: Additional studies/ records that were reviewed today include: . Review of the above records demonstrates:    ASSESSMENT AND PLAN:  1.  Chronic combined diastolic and systolic  heart failure, without evidence of volume overload and asymptomatic 2. Coronary artery disease, asymptomatic 3. Essential hypertension, under excellent control 4. Chronic kidney disease, stage IV in stable   Current medicines are reviewed at length with the patient today.  The patient does not have concerns regarding medicines.  The following changes have been made:  no change  Labs/ tests ordered today include: None  No orders of the defined types were placed in this encounter.     Disposition:   FU with chest. Smith in 9 months   Signed, Lesleigh Noe, MD  02/10/2015 8:54 AM    [facility] [address] [place], [place], [place]  [address] Phone: [phone]; Fax: [phone]

## 2015-02-10 NOTE — Patient Instructions (Signed)
Your physician wants you to follow-up in: 9-12 months with Dr. Smith. You will receive a reminder letter in the mail two months in advance. If you don't receive a letter, please call our office to schedule the follow-up appointment.  Your physician recommends that you continue on your current medications as directed. Please refer to the Current Medication list given to you today.  

## 2015-02-24 ENCOUNTER — Encounter (HOSPITAL_COMMUNITY)
Admission: RE | Admit: 2015-02-24 | Discharge: 2015-02-24 | Disposition: A | Discharge status: Home / Self Care | Payer: Medicare Other | Source: Ambulatory Visit | Attending: Nephrology | Admitting: Nephrology

## 2015-02-24 DIAGNOSIS — D631 Anemia in chronic kidney disease: Secondary | ICD-10-CM | POA: Diagnosis present

## 2015-02-24 DIAGNOSIS — N183 Chronic kidney disease, stage 3 (moderate): Secondary | ICD-10-CM | POA: Insufficient documentation

## 2015-02-24 MED ORDER — DARBEPOETIN ALFA 40 MCG/0.4ML IJ SOSY
40.0000 ug | PREFILLED_SYRINGE | INTRAMUSCULAR | Status: DC
  Administered 2015-02-24: 40 ug via SUBCUTANEOUS

## 2015-02-24 MED ORDER — DARBEPOETIN ALFA 40 MCG/0.4ML IJ SOSY
PREFILLED_SYRINGE | INTRAMUSCULAR | Status: AC
  Administered 2015-02-24: 40 ug via SUBCUTANEOUS
  Filled 2015-02-24: qty 0.4

## 2015-03-14 ENCOUNTER — Encounter: Payer: Self-pay | Admitting: Vascular Surgery

## 2015-03-15 ENCOUNTER — Encounter: Payer: Self-pay | Admitting: Vascular Surgery

## 2015-03-15 ENCOUNTER — Ambulatory Visit (HOSPITAL_COMMUNITY)
Admission: RE | Admit: 2015-03-15 | Discharge: 2015-03-15 | Disposition: A | Discharge status: Home / Self Care | Payer: Medicare Other | Source: Ambulatory Visit | Attending: Vascular Surgery | Admitting: Vascular Surgery

## 2015-03-15 ENCOUNTER — Ambulatory Visit (INDEPENDENT_AMBULATORY_CARE_PROVIDER_SITE_OTHER): Payer: Medicare Other | Admitting: Vascular Surgery

## 2015-03-15 VITALS — BP 186/61 | HR 56 | Resp 18 | Ht 59.0 in | Wt 176.3 lb

## 2015-03-15 DIAGNOSIS — I70209 Unspecified atherosclerosis of native arteries of extremities, unspecified extremity: Secondary | ICD-10-CM | POA: Diagnosis not present

## 2015-03-15 DIAGNOSIS — I739 Peripheral vascular disease, unspecified: Secondary | ICD-10-CM

## 2015-03-15 NOTE — Progress Notes (Signed)
Established Previous Bypass  History of Present Illness  Dorothy Bartlett is a 81 y.o. (May 25, 1926) female who presents for follow-up s/p right femoral to peroneal bypass and amputation of right third toe (Date: 08/04/14). She also had a left femoral to posterior tibial bypass with in situ great saphenous vein (Date: 02/24/14). She was last seen in the office on 10/22/14 by Rosalita ChessmanSuzanne Nickel where there was concern about her third toe amputation healing and therefore subsequently underwent arteriogram by Dr. Edilia Boickson on 10/25/14 that revealed a patent right femoral to peroneal artery bypass with at least two 2 AV fistulas present.    The patient returns today with no complaints of pain. She feels "tightness" in her feet but no pain. She is able to ambulate well without use of a walker or cane. She denies any lower extremity wounds, claudication or rest pain.   Physical Examination  Filed Vitals:   03/15/15 1610  BP: 186/61  Pulse: 56  Resp: 18  Height: 4\' 11"  (1.499 m)  Weight: 176 lb 4.8 oz (79.969 kg)   Body mass index is 35.59 kg/(m^2).  Physical Examination  Filed Vitals:   03/15/15 1610  BP: 186/61  Pulse: 56  Resp: 18  Height: 4\' 11"  (1.499 m)  Weight: 176 lb 4.8 oz (79.969 kg)   Body mass index is 35.59 kg/(m^2).  General: A&O x 3, WD obese female in NAD  Pulmonary: Sym exp, good air movt  Vascular: Easily palpable right popliteal pulse. Easily palpable left in situ graft pulse.    Musculoskeletal: Extremities without ischemic changes. Well healed right third toe amputation site. No wounds or sores on lower extremities.   Neurologic: Pain and light touch intact in extremities. Motor exam as listed above  Non-Invasive Vascular Imaging ABI (Date: 03/15/2015) R: 1.0; TBI <0.69 L: Non-compressible arteries; TBI: <0.69  Lower Extremity Arterial Duplex (Date: 03/15/2015)  Right femoral to peroneal bypass patent  Left femoral to posterior tibial bypass patient with area  of stenosis at proximal anastomosis.   Medical Decision Making  Dorothy Bartlett is a 81 y.o. female who presents s/p s/p right femoral to peroneal bypass and amputation of right third toe (Date: 08/04/14) and left femoral to posterior tibial bypass with in situ great saphenous vein (Date: 02/24/14).   The patient is doing well and ambulating without difficulty and pain. Her bypasses are patent bilaterally. She does have some stenosis at the proximal anastomosis of her left femoral-posterior tibial bypass but has an easily palpable subcuticular graft pulse distally. She will follow up in six months with ABIs and lower extremity arterial duplex to monitor her bypasses. She knows to follow up sooner if she develops any rest pain, claudication or non-healing wounds.   Maris BergerKimberly Kimerly Rowand, PA-C [facility] Office: [phone] Pager: [phone]  03/15/2015, 4:37 PM  This patient was seen in conjunction with Dr. Arbie CookeyEarly    I have examined the patient, reviewed and agree with above. Here today with her daughter. Doing extremely well. Denies any foot pain and is walking without the assistance of a walker or a cane. Very pleased with her current quality of life. I did discuss the elevated velocities proximal segment of her left femoral peroneal bypass. Would monitor this only with no recommended further arteriogram or other intervention. Do not feel this concern appreciable risk for closure. She will continue her walking program with her see her again in 6 months with repeat studies  EARLY, TODD, MD 03/15/2015  5:00 PM

## 2015-03-24 ENCOUNTER — Encounter (HOSPITAL_COMMUNITY)
Admission: RE | Admit: 2015-03-24 | Discharge: 2015-03-24 | Disposition: A | Discharge status: Home / Self Care | Payer: Medicare Other | Source: Ambulatory Visit | Attending: Nephrology | Admitting: Nephrology

## 2015-03-24 DIAGNOSIS — N183 Chronic kidney disease, stage 3 (moderate): Secondary | ICD-10-CM | POA: Insufficient documentation

## 2015-03-24 DIAGNOSIS — D631 Anemia in chronic kidney disease: Secondary | ICD-10-CM | POA: Insufficient documentation

## 2015-03-24 MED ORDER — DARBEPOETIN ALFA 40 MCG/0.4ML IJ SOSY
PREFILLED_SYRINGE | INTRAMUSCULAR | Status: AC
  Filled 2015-03-24: qty 0.4

## 2015-03-24 MED ORDER — DARBEPOETIN ALFA 40 MCG/0.4ML IJ SOSY
40.0000 ug | PREFILLED_SYRINGE | INTRAMUSCULAR | Status: DC
  Administered 2015-03-24: 40 ug via SUBCUTANEOUS

## 2015-04-28 ENCOUNTER — Encounter (HOSPITAL_COMMUNITY): Payer: Medicare Other

## 2015-05-04 ENCOUNTER — Encounter (HOSPITAL_COMMUNITY)
Admission: RE | Admit: 2015-05-04 | Discharge: 2015-05-04 | Disposition: A | Discharge status: Home / Self Care | Payer: Medicare Other | Source: Ambulatory Visit | Attending: Nephrology | Admitting: Nephrology

## 2015-05-04 DIAGNOSIS — N183 Chronic kidney disease, stage 3 (moderate): Secondary | ICD-10-CM | POA: Insufficient documentation

## 2015-05-04 DIAGNOSIS — D631 Anemia in chronic kidney disease: Secondary | ICD-10-CM | POA: Diagnosis not present

## 2015-05-04 MED ORDER — DARBEPOETIN ALFA 40 MCG/0.4ML IJ SOSY
40.0000 ug | PREFILLED_SYRINGE | INTRAMUSCULAR | Status: DC
  Administered 2015-05-04: 40 ug via SUBCUTANEOUS

## 2015-05-04 MED ORDER — DARBEPOETIN ALFA 40 MCG/0.4ML IJ SOSY
PREFILLED_SYRINGE | INTRAMUSCULAR | Status: AC
  Filled 2015-05-04: qty 0.4

## 2015-05-06 ENCOUNTER — Ambulatory Visit: Payer: Medicare Other | Admitting: Podiatry

## 2015-05-08 ENCOUNTER — Encounter (HOSPITAL_COMMUNITY): Payer: Self-pay

## 2015-05-08 ENCOUNTER — Emergency Department (HOSPITAL_COMMUNITY): Payer: Medicare Other

## 2015-05-08 ENCOUNTER — Inpatient Hospital Stay (HOSPITAL_COMMUNITY)
Admission: EM | Admit: 2015-05-08 | Discharge: 2015-05-15 | DRG: 242 | Disposition: A | Discharge status: Skilled Nursing Facility | Payer: Medicare Other | Attending: Interventional Cardiology | Admitting: Interventional Cardiology

## 2015-05-08 ENCOUNTER — Other Ambulatory Visit (HOSPITAL_COMMUNITY): Payer: Self-pay

## 2015-05-08 DIAGNOSIS — N184 Chronic kidney disease, stage 4 (severe): Secondary | ICD-10-CM | POA: Diagnosis present

## 2015-05-08 DIAGNOSIS — E874 Mixed disorder of acid-base balance: Secondary | ICD-10-CM | POA: Diagnosis not present

## 2015-05-08 DIAGNOSIS — I441 Atrioventricular block, second degree: Secondary | ICD-10-CM | POA: Diagnosis present

## 2015-05-08 DIAGNOSIS — N186 End stage renal disease: Secondary | ICD-10-CM | POA: Diagnosis present

## 2015-05-08 DIAGNOSIS — H547 Unspecified visual loss: Secondary | ICD-10-CM | POA: Diagnosis present

## 2015-05-08 DIAGNOSIS — R778 Other specified abnormalities of plasma proteins: Secondary | ICD-10-CM

## 2015-05-08 DIAGNOSIS — N179 Acute kidney failure, unspecified: Secondary | ICD-10-CM | POA: Diagnosis present

## 2015-05-08 DIAGNOSIS — E785 Hyperlipidemia, unspecified: Secondary | ICD-10-CM | POA: Diagnosis present

## 2015-05-08 DIAGNOSIS — I442 Atrioventricular block, complete: Secondary | ICD-10-CM | POA: Diagnosis present

## 2015-05-08 DIAGNOSIS — E6609 Other obesity due to excess calories: Secondary | ICD-10-CM | POA: Diagnosis present

## 2015-05-08 DIAGNOSIS — E669 Obesity, unspecified: Secondary | ICD-10-CM | POA: Diagnosis not present

## 2015-05-08 DIAGNOSIS — Z833 Family history of diabetes mellitus: Secondary | ICD-10-CM | POA: Diagnosis not present

## 2015-05-08 DIAGNOSIS — Z5320 Procedure and treatment not carried out because of patient's decision for unspecified reasons: Secondary | ICD-10-CM | POA: Diagnosis present

## 2015-05-08 DIAGNOSIS — I252 Old myocardial infarction: Secondary | ICD-10-CM

## 2015-05-08 DIAGNOSIS — Z6836 Body mass index (BMI) 36.0-36.9, adult: Secondary | ICD-10-CM | POA: Diagnosis not present

## 2015-05-08 DIAGNOSIS — I452 Bifascicular block: Secondary | ICD-10-CM | POA: Diagnosis present

## 2015-05-08 DIAGNOSIS — R001 Bradycardia, unspecified: Secondary | ICD-10-CM | POA: Diagnosis present

## 2015-05-08 DIAGNOSIS — J4520 Mild intermittent asthma, uncomplicated: Secondary | ICD-10-CM | POA: Diagnosis present

## 2015-05-08 DIAGNOSIS — Z7982 Long term (current) use of aspirin: Secondary | ICD-10-CM | POA: Diagnosis not present

## 2015-05-08 DIAGNOSIS — Z7951 Long term (current) use of inhaled steroids: Secondary | ICD-10-CM

## 2015-05-08 DIAGNOSIS — I248 Other forms of acute ischemic heart disease: Secondary | ICD-10-CM | POA: Diagnosis present

## 2015-05-08 DIAGNOSIS — E1122 Type 2 diabetes mellitus with diabetic chronic kidney disease: Secondary | ICD-10-CM | POA: Diagnosis present

## 2015-05-08 DIAGNOSIS — Z79899 Other long term (current) drug therapy: Secondary | ICD-10-CM | POA: Diagnosis not present

## 2015-05-08 DIAGNOSIS — I119 Hypertensive heart disease without heart failure: Secondary | ICD-10-CM | POA: Diagnosis present

## 2015-05-08 DIAGNOSIS — Z794 Long term (current) use of insulin: Secondary | ICD-10-CM | POA: Diagnosis not present

## 2015-05-08 DIAGNOSIS — D631 Anemia in chronic kidney disease: Secondary | ICD-10-CM | POA: Diagnosis present

## 2015-05-08 DIAGNOSIS — N189 Chronic kidney disease, unspecified: Secondary | ICD-10-CM

## 2015-05-08 DIAGNOSIS — I5043 Acute on chronic combined systolic (congestive) and diastolic (congestive) heart failure: Secondary | ICD-10-CM | POA: Diagnosis present

## 2015-05-08 DIAGNOSIS — Z959 Presence of cardiac and vascular implant and graft, unspecified: Secondary | ICD-10-CM

## 2015-05-08 DIAGNOSIS — I272 Other secondary pulmonary hypertension: Secondary | ICD-10-CM | POA: Diagnosis present

## 2015-05-08 DIAGNOSIS — Z951 Presence of aortocoronary bypass graft: Secondary | ICD-10-CM | POA: Diagnosis not present

## 2015-05-08 DIAGNOSIS — Z9861 Coronary angioplasty status: Secondary | ICD-10-CM

## 2015-05-08 DIAGNOSIS — N185 Chronic kidney disease, stage 5: Secondary | ICD-10-CM | POA: Diagnosis not present

## 2015-05-08 DIAGNOSIS — I132 Hypertensive heart and chronic kidney disease with heart failure and with stage 5 chronic kidney disease, or end stage renal disease: Secondary | ICD-10-CM | POA: Diagnosis present

## 2015-05-08 DIAGNOSIS — I739 Peripheral vascular disease, unspecified: Secondary | ICD-10-CM | POA: Diagnosis present

## 2015-05-08 DIAGNOSIS — Z89421 Acquired absence of other right toe(s): Secondary | ICD-10-CM

## 2015-05-08 DIAGNOSIS — Z8249 Family history of ischemic heart disease and other diseases of the circulatory system: Secondary | ICD-10-CM

## 2015-05-08 DIAGNOSIS — Z87891 Personal history of nicotine dependence: Secondary | ICD-10-CM | POA: Diagnosis not present

## 2015-05-08 DIAGNOSIS — Z950 Presence of cardiac pacemaker: Secondary | ICD-10-CM | POA: Insufficient documentation

## 2015-05-08 DIAGNOSIS — I4510 Unspecified right bundle-branch block: Secondary | ICD-10-CM | POA: Diagnosis present

## 2015-05-08 DIAGNOSIS — R55 Syncope and collapse: Secondary | ICD-10-CM

## 2015-05-08 DIAGNOSIS — I4891 Unspecified atrial fibrillation: Secondary | ICD-10-CM | POA: Diagnosis present

## 2015-05-08 DIAGNOSIS — R7989 Other specified abnormal findings of blood chemistry: Secondary | ICD-10-CM | POA: Diagnosis not present

## 2015-05-08 DIAGNOSIS — E1129 Type 2 diabetes mellitus with other diabetic kidney complication: Secondary | ICD-10-CM | POA: Diagnosis present

## 2015-05-08 DIAGNOSIS — I2510 Atherosclerotic heart disease of native coronary artery without angina pectoris: Secondary | ICD-10-CM | POA: Diagnosis present

## 2015-05-08 DIAGNOSIS — N039 Chronic nephritic syndrome with unspecified morphologic changes: Secondary | ICD-10-CM

## 2015-05-08 DIAGNOSIS — Z955 Presence of coronary angioplasty implant and graft: Secondary | ICD-10-CM

## 2015-05-08 DIAGNOSIS — I440 Atrioventricular block, first degree: Secondary | ICD-10-CM | POA: Diagnosis present

## 2015-05-08 DIAGNOSIS — R531 Weakness: Secondary | ICD-10-CM

## 2015-05-08 DIAGNOSIS — I6523 Occlusion and stenosis of bilateral carotid arteries: Secondary | ICD-10-CM | POA: Diagnosis present

## 2015-05-08 DIAGNOSIS — E1151 Type 2 diabetes mellitus with diabetic peripheral angiopathy without gangrene: Secondary | ICD-10-CM | POA: Diagnosis present

## 2015-05-08 HISTORY — DX: Anemia in chronic kidney disease: D63.1

## 2015-05-08 HISTORY — DX: Chronic combined systolic (congestive) and diastolic (congestive) heart failure: I50.42

## 2015-05-08 HISTORY — DX: Type 2 diabetes mellitus with other diabetic kidney complication: E11.29

## 2015-05-08 HISTORY — DX: Old myocardial infarction: I25.2

## 2015-05-08 HISTORY — DX: Unspecified asthma, uncomplicated: J45.909

## 2015-05-08 HISTORY — DX: Chronic nephritic syndrome with unspecified morphologic changes: N03.9

## 2015-05-08 HISTORY — DX: Atrioventricular block, complete: I44.2

## 2015-05-08 HISTORY — DX: Obesity, unspecified: E66.9

## 2015-05-08 LAB — BASIC METABOLIC PANEL
ANION GAP: 13 (ref 5–15)
BUN: 106 mg/dL — ABNORMAL HIGH (ref 6–20)
CALCIUM: 9.3 mg/dL (ref 8.9–10.3)
CO2: 20 mmol/L — AB (ref 22–32)
CREATININE: 5.44 mg/dL — AB (ref 0.44–1.00)
Chloride: 104 mmol/L (ref 101–111)
GFR calc Af Amer: 7 mL/min — ABNORMAL LOW (ref >60)
GFR, EST NON AFRICAN AMERICAN: 6 mL/min — AB (ref >60)
Glucose, Bld: 195 mg/dL — ABNORMAL HIGH (ref 70–99)
Potassium: 4.4 mmol/L (ref 3.5–5.1)
SODIUM: 137 mmol/L (ref 135–145)

## 2015-05-08 LAB — CBC WITH DIFFERENTIAL/PLATELET
BASOS ABS: 0 10*3/uL (ref 0.0–0.1)
Basophils Relative: 0 % (ref 0–1)
EOS ABS: 0 10*3/uL (ref 0.0–0.7)
EOS PCT: 0 % (ref 0–5)
HCT: 31.8 % — ABNORMAL LOW (ref 36.0–46.0)
Hemoglobin: 10.3 g/dL — ABNORMAL LOW (ref 12.0–15.0)
LYMPHS PCT: 22 % (ref 12–46)
Lymphs Abs: 2.1 10*3/uL (ref 0.7–4.0)
MCH: 30 pg (ref 26.0–34.0)
MCHC: 32.4 g/dL (ref 30.0–36.0)
MCV: 92.7 fL (ref 78.0–100.0)
Monocytes Absolute: 0.8 10*3/uL (ref 0.1–1.0)
Monocytes Relative: 8 % (ref 3–12)
Neutro Abs: 6.7 10*3/uL (ref 1.7–7.7)
Neutrophils Relative %: 70 % (ref 43–77)
PLATELETS: 157 10*3/uL (ref 150–400)
RBC: 3.43 MIL/uL — ABNORMAL LOW (ref 3.87–5.11)
RDW: 14.3 % (ref 11.5–15.5)
WBC: 9.6 10*3/uL (ref 4.0–10.5)

## 2015-05-08 LAB — COMPREHENSIVE METABOLIC PANEL
ALBUMIN: 3.6 g/dL (ref 3.5–5.0)
ALT: 84 U/L — ABNORMAL HIGH (ref 14–54)
ANION GAP: 16 — AB (ref 5–15)
AST: 83 U/L — ABNORMAL HIGH (ref 15–41)
Alkaline Phosphatase: 67 U/L (ref 38–126)
BUN: 108 mg/dL — ABNORMAL HIGH (ref 6–20)
CO2: 17 mmol/L — AB (ref 22–32)
CREATININE: 5.62 mg/dL — AB (ref 0.44–1.00)
Calcium: 9 mg/dL (ref 8.9–10.3)
Chloride: 104 mmol/L (ref 101–111)
GFR, EST AFRICAN AMERICAN: 7 mL/min — AB (ref >60)
GFR, EST NON AFRICAN AMERICAN: 6 mL/min — AB (ref >60)
GLUCOSE: 144 mg/dL — AB (ref 70–99)
POTASSIUM: 4.4 mmol/L (ref 3.5–5.1)
Sodium: 137 mmol/L (ref 135–145)
Total Bilirubin: 0.6 mg/dL (ref 0.3–1.2)
Total Protein: 6.6 g/dL (ref 6.5–8.1)

## 2015-05-08 LAB — MRSA PCR SCREENING: MRSA by PCR: NEGATIVE

## 2015-05-08 LAB — TROPONIN I
TROPONIN I: 0.48 ng/mL — AB (ref <0.031)
Troponin I: 0.64 ng/mL (ref <0.031)
Troponin I: 0.53 ng/mL (ref <0.031)

## 2015-05-08 LAB — APTT: aPTT: 34 seconds (ref 24–37)

## 2015-05-08 LAB — I-STAT CHEM 8, ED
BUN: 109 mg/dL — ABNORMAL HIGH (ref 6–20)
CHLORIDE: 106 mmol/L (ref 101–111)
Calcium, Ion: 1.16 mmol/L (ref 1.13–1.30)
Creatinine, Ser: 5.4 mg/dL — ABNORMAL HIGH (ref 0.44–1.00)
Glucose, Bld: 190 mg/dL — ABNORMAL HIGH (ref 70–99)
HEMATOCRIT: 34 % — AB (ref 36.0–46.0)
HEMOGLOBIN: 11.6 g/dL — AB (ref 12.0–15.0)
Potassium: 4.5 mmol/L (ref 3.5–5.1)
SODIUM: 138 mmol/L (ref 135–145)
TCO2: 17 mmol/L (ref 0–100)

## 2015-05-08 LAB — GLUCOSE, CAPILLARY
GLUCOSE-CAPILLARY: 144 mg/dL — AB (ref 70–99)
Glucose-Capillary: 192 mg/dL — ABNORMAL HIGH (ref 70–99)

## 2015-05-08 LAB — CBC
HEMATOCRIT: 30.9 % — AB (ref 36.0–46.0)
HEMOGLOBIN: 10.2 g/dL — AB (ref 12.0–15.0)
MCH: 30 pg (ref 26.0–34.0)
MCHC: 33 g/dL (ref 30.0–36.0)
MCV: 90.9 fL (ref 78.0–100.0)
Platelets: 151 10*3/uL (ref 150–400)
RBC: 3.4 MIL/uL — ABNORMAL LOW (ref 3.87–5.11)
RDW: 14.4 % (ref 11.5–15.5)
WBC: 9.3 10*3/uL (ref 4.0–10.5)

## 2015-05-08 LAB — I-STAT TROPONIN, ED: Troponin i, poc: 0.29 ng/mL (ref 0.00–0.08)

## 2015-05-08 LAB — PROTIME-INR
INR: 1.29 (ref 0.00–1.49)
Prothrombin Time: 16.2 seconds — ABNORMAL HIGH (ref 11.6–15.2)

## 2015-05-08 LAB — TSH: TSH: 2.772 u[IU]/mL (ref 0.350–4.500)

## 2015-05-08 LAB — I-STAT CG4 LACTIC ACID, ED: LACTIC ACID, VENOUS: 1.73 mmol/L (ref 0.5–2.0)

## 2015-05-08 MED ORDER — ISOSORBIDE MONONITRATE ER 30 MG PO TB24
30.0000 mg | ORAL_TABLET | Freq: Every day | ORAL | Status: DC
  Administered 2015-05-09 – 2015-05-14 (×6): 30 mg via ORAL
  Filled 2015-05-08 (×6): qty 1

## 2015-05-08 MED ORDER — ATROPINE SULFATE 1 MG/ML IJ SOLN
0.5000 mg | Freq: Once | INTRAMUSCULAR | Status: AC
  Administered 2015-05-08: 0.5 mg via INTRAVENOUS
  Filled 2015-05-08: qty 1

## 2015-05-08 MED ORDER — ACETAMINOPHEN 325 MG PO TABS: 650.0000 mg | ORAL_TABLET | ORAL | Status: DC | PRN

## 2015-05-08 MED ORDER — ALBUTEROL SULFATE HFA 108 (90 BASE) MCG/ACT IN AERS: 2.0000 | INHALATION_SPRAY | Freq: Four times a day (QID) | RESPIRATORY_TRACT | Status: DC | PRN

## 2015-05-08 MED ORDER — ALBUTEROL SULFATE (2.5 MG/3ML) 0.083% IN NEBU
2.5000 mg | INHALATION_SOLUTION | Freq: Four times a day (QID) | RESPIRATORY_TRACT | Status: DC | PRN
  Administered 2015-05-11: 2.5 mg via RESPIRATORY_TRACT
  Filled 2015-05-08: qty 3

## 2015-05-08 MED ORDER — ASPIRIN EC 81 MG PO TBEC
81.0000 mg | DELAYED_RELEASE_TABLET | Freq: Every day | ORAL | Status: DC
  Administered 2015-05-09 – 2015-05-15 (×7): 81 mg via ORAL
  Filled 2015-05-08 (×7): qty 1

## 2015-05-08 MED ORDER — HEPARIN SODIUM (PORCINE) 5000 UNIT/ML IJ SOLN
5000.0000 [IU] | Freq: Three times a day (TID) | INTRAMUSCULAR | Status: DC
  Administered 2015-05-08 (×2): 5000 [IU] via SUBCUTANEOUS
  Filled 2015-05-08 (×2): qty 1

## 2015-05-08 MED ORDER — ASPIRIN 81 MG PO TABS: 81.0000 mg | ORAL_TABLET | Freq: Every morning | ORAL | Status: DC

## 2015-05-08 MED ORDER — INSULIN ASPART 100 UNIT/ML ~~LOC~~ SOLN
0.0000 [IU] | Freq: Three times a day (TID) | SUBCUTANEOUS | Status: DC
  Administered 2015-05-11 (×2): 2 [IU] via SUBCUTANEOUS
  Administered 2015-05-11: 3 [IU] via SUBCUTANEOUS
  Administered 2015-05-13: 2 [IU] via SUBCUTANEOUS
  Administered 2015-05-14 (×2): 3 [IU] via SUBCUTANEOUS

## 2015-05-08 MED ORDER — ONDANSETRON HCL 4 MG/2ML IJ SOLN: 4.0000 mg | Freq: Four times a day (QID) | INTRAMUSCULAR | Status: DC | PRN

## 2015-05-08 MED ORDER — SODIUM CHLORIDE 0.9 % IV SOLN
Freq: Once | INTRAVENOUS | Status: AC
  Administered 2015-05-08: 75 mL/h via INTRAVENOUS

## 2015-05-08 MED ORDER — NITROGLYCERIN 0.4 MG SL SUBL: 0.4000 mg | SUBLINGUAL_TABLET | SUBLINGUAL | Status: DC | PRN

## 2015-05-08 MED ORDER — DOCUSATE SODIUM 100 MG PO CAPS
100.0000 mg | ORAL_CAPSULE | Freq: Two times a day (BID) | ORAL | Status: DC
  Administered 2015-05-08 – 2015-05-15 (×12): 100 mg via ORAL
  Filled 2015-05-08 (×12): qty 1

## 2015-05-08 MED ORDER — ATORVASTATIN CALCIUM 40 MG PO TABS
40.0000 mg | ORAL_TABLET | Freq: Every morning | ORAL | Status: DC
  Administered 2015-05-09 – 2015-05-15 (×7): 40 mg via ORAL
  Filled 2015-05-08 (×7): qty 1

## 2015-05-08 MED ORDER — PARICALCITOL 1 MCG PO CAPS
1.0000 ug | ORAL_CAPSULE | Freq: Every day | ORAL | Status: DC
  Administered 2015-05-09 – 2015-05-15 (×7): 1 ug via ORAL
  Filled 2015-05-08 (×7): qty 1

## 2015-05-08 MED ORDER — LINAGLIPTIN 5 MG PO TABS
5.0000 mg | ORAL_TABLET | Freq: Every day | ORAL | Status: DC
  Administered 2015-05-11 – 2015-05-15 (×5): 5 mg via ORAL
  Filled 2015-05-08 (×5): qty 1

## 2015-05-08 NOTE — Consult Note (Signed)
Renal Service Consult Note [facility]  Claretta FraiseCorine H Foskey 05/08/2015 Delano MetzSCHERTZ,Jarell Mcewen D Requesting Physician:  Dr Katrinka BlazingSmith  Reason for Consult:  CKD patient w [facility] HPI: The patient is a 81 y.o. year-old with hx of HL, HTN, CAD/ stents, obesity, anemia, PVD , DM2 and CKD stage IV. Her baseline creat from recent office records in March '16 was around 3.5.  She presents with weakness and dizziness for 2 days, then this am had a syncopal episode.  In ED was found to have complete heart block w VR of around 28 bpm. Creat is 5.5 with BUN 100.   Patient has no complaints other than as noted above. No CP or SOB, no severe fatigue or anorexia, no n/v, no jerking or seizures.  Making urine at home, takes lasix 40 mg daily along w coreg/ hydralazine for BP. Imdur,asa, statin, insulin and Januvia are other medications.   Patient sees Dr Hyman HopesWebb for CKD for quite some time. Her office records show that she has repeatedly told Dr Hyman HopesWebb that she would not do dialysis. Today when asked about dialysis she said she's "not interested", says "I'm 81 years old, i've had a good life".  Past Medical History  Past Medical History  Diagnosis Date  . Hyperlipidemia   . Carotid stenosis     Carotid US (12/2013): < 60% RCA, < 40% LICA.  [name]. CKD (chronic kidney disease), stage IV     notes 02/22/2014; Dr. Hyman HopesWebb  . Hypertension   . Coronary artery disease     a. s/p MI 1995 tx w/ PTCA to Dx, Ramus, ap LAD;  b. LHC (11/2004):  Inferior HK, EF 60%, proximal LAD 50%, ostial D1 80%, mid D1 95%, mid LAD 50-60%, distal LAD at the apex 90%, OM1 occluded (fills late by left to left collaterals), RCA 75%. Medical therapy recommended.;  c. Lexiscan Myoview (12/2013):  Prior ant-lat scar with very small area of peri-infarct ischemia in ant wall, EF 40 (high risk)   . Obesity (BMI 30-39.9)   . 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 Type 2 diabetes mellitus with renal manifestations   .  Intrinsic asthma 01/28/2008     Mild intermittant asthma, PFT's 01/28/08     . Old anterolateral myocardial infarction   . PVD (peripheral vascular disease) 02/16/2014    2015, right femoral peroneal bypass grafting and left femoral-tibial bypass grafting by Dr. Arbie CookeyEarly for rest pain as well as nonhealing ulcer on toe accompanied with toe amputation  Arteriogram October 2015 with patent graft on the right, 70% stenosis of graft on the left proximally.   . Chronic combined systolic and diastolic heart failure 04/13/2014   Past Surgical History  Past Surgical History  Procedure Laterality Date  . Repair of nerve in left arm  Left 2000  . Cardiac catheterization  1995    w/ PTCA Diag 2nd MI  . Femoral-tibial bypass graft Left 02/24/2014    Procedure: BYPASS GRAFT FEMORAL-TIBIAL ARTERY;  Surgeon: Larina Earthlyodd F Early, MD;  [facility];  Service: Vascular;  Laterality: Left;  . Femoral-popliteal bypass graft Right 08/04/2014    Procedure: RIGHT FEMORAL-PERONEAL ARTERY BYPASS GRAFT;  Surgeon: Larina Earthlyodd F Early, MD;  [facility];  Service: Vascular;  Laterality: Right;  . Amputation Right 08/04/2014    Procedure: AMPUTATION DIGIT-RIGHT 3RD TOE;  Surgeon: Larina Earthlyodd F Early, MD;  [facility];  Service: Vascular;  Laterality: Right;  . Lower extremity angiogram Left 01/08/2014  Procedure: LOWER EXTREMITY ANGIOGRAM;  Surgeon: Sherren Kerns, MD;  Location: [facility] CATH LAB;  Service: Cardiovascular;  Laterality: Left;  . Abdominal angiogram  01/08/2014    Procedure: ABDOMINAL ANGIOGRAM;  Surgeon: Sherren Kerns, MD;  Location: [facility] CATH LAB;  Service: Cardiovascular;;  . Lower extremity angiogram Right 08/03/2014    Procedure: LOWER EXTREMITY ANGIOGRAM;  Surgeon: Nada Libman, MD;  Location: [facility] CATH LAB;  Service: Cardiovascular;  Laterality: Right;  . Abdominal aortagram N/A 10/25/2014    Procedure: ABDOMINAL Ronny Flurry;  Surgeon: Chuck Hint, MD;  Location: [facility] CATH LAB;  Service: Cardiovascular;  Laterality:  N/A;   Family History  Family History  Problem Relation Age of Onset  . Diabetes Father   . Hypertension Father   . Hypertension Daughter   . Hyperlipidemia Daughter   . Hyperlipidemia Son   . Hypertension Sister    Social History  reports that she has never smoked. She has never used smokeless tobacco. She reports that she does not drink alcohol or use illicit drugs. Allergies No Known Allergies Home medications Prior to Admission medications   Medication Sig Start Date End Date Taking? Authorizing Provider  albuterol (PROVENTIL HFA;VENTOLIN HFA) 108 (90 BASE) MCG/ACT inhaler Inhale 2 puffs into the lungs every 6 (six) hours as needed for wheezing or shortness of breath.   Yes Historical Provider, MD  aspirin 81 MG tablet Take 81 mg by mouth every morning.    Yes Historical Provider, MD  atorvastatin (LIPITOR) 40 MG tablet Take 40 mg by mouth every morning.  01/25/14  Yes Historical Provider, MD  B Complex-C-Folic Acid (RENA-VITE RX) 1 MG TABS Take 1 tablet by mouth every morning.  02/05/14  Yes Historical Provider, MD  carvedilol (COREG) 12.5 MG tablet Take 1 tablet (12.5 mg total) by mouth 2 (two) times daily. 02/04/14  Yes Lyn Records, MD  docusate sodium (COLACE) 100 MG capsule Take 100 mg by mouth 2 (two) times daily.    Yes Historical Provider, MD  furosemide (LASIX) 40 MG tablet Take 1 tablet (40 mg total) by mouth daily. 04/20/14  Yes Beatrice Lecher, PA-C  hydrALAZINE (APRESOLINE) 10 MG tablet Take 10 mg by mouth 3 (three) times daily.   Yes Historical Provider, MD  insulin lispro (HUMALOG) 100 UNIT/ML injection Inject 5 Units into the skin 2 (two) times daily as needed for high blood sugar. Only takes if CBG >250.   Yes Historical Provider, MD  isosorbide mononitrate (IMDUR) 30 MG 24 hr tablet Take 1 tablet (30 mg total) by mouth daily. 04/18/14  Yes Marinda Elk, MD  loratadine (CLARITIN) 10 MG tablet Take 10 mg by mouth daily as needed for allergies.   Yes Historical  Provider, MD  Melatonin 3 MG TABS Take 3 mg by mouth at bedtime as needed (for sleep).    Yes Historical Provider, MD  paricalcitol (ZEMPLAR) 1 MCG capsule Take 1 mcg by mouth daily.  04/07/14  Yes Historical Provider, MD  sitaGLIPtin (JANUVIA) 25 MG tablet Take 25 mg by mouth daily.   Yes Historical Provider, MD   Liver Function Tests  Recent Labs Lab 05/08/15 1650  AST 83*  ALT 84*  ALKPHOS 67  BILITOT 0.6  PROT 6.6  ALBUMIN 3.6   No results for input(s): LIPASE, AMYLASE in the last 168 hours. CBC  Recent Labs Lab 05/08/15 1314 05/08/15 1320 05/08/15 1650  WBC 9.6  --  9.3  NEUTROABS 6.7  --   --  HGB 10.3* 11.6* 10.2*  HCT 31.8* 34.0* 30.9*  MCV 92.7  --  90.9  PLT 157  --  151   Basic Metabolic Panel  Recent Labs Lab 05/08/15 1314 05/08/15 1320 05/08/15 1650  NA 137 138 137  K 4.4 4.5 4.4  CL 104 106 104  CO2 20*  --  17*  GLUCOSE 195* 190* 144*  BUN 106* 109* 108*  CREATININE 5.44* 5.40* 5.62*  CALCIUM 9.3  --  9.0    Filed Vitals:   05/08/15 1542 05/08/15 1648 05/08/15 1700 05/08/15 1800  BP: 148/58     Pulse:  27 26 48  Temp:  97.4 F (36.3 C)    TempSrc:  Oral    Resp:  15 17 16   Height:  4\' 11"  (1.499 m)    Weight:  84.369 kg (186 lb)    SpO2:  100% 99% 99%   Exam No distress, calm No rash, cyanosis or gangrene Sclera anicteric, throat clear No jvd Chest clear bilat RRR no MRG Abd soft, NTND , no ascites 1+ RLE edema Neuro is alert, ox 3, no asterixis   Assessment/Plan: 1. Acute on CKD IV - suspect some decreased perfusion from the bradycardia.  Not overtly uremic.  Agree with cautious IVF's at 75 cc/ hr. No vol excess on exam.  Patient refusing any consideration of dialysis, this has been documented previously. Renal function should improve w pacemaker / enhanced cardiac output. No other suggestions.  Will sign off, please call as needed.   2. Complete heart block,new onset 3. HTN 4. DM2 5. CAD hx stents 6. PVD bilat LE bypass  surgeries in the past 7. Anemia on EPO  Rob Arlean HoppingSchertz MD (pgr) [phone]    ([phone]) [phone] 05/08/2015, 8:50 PM

## 2015-05-08 NOTE — H&P (Addendum)
History and Physical   Admit date: 05/08/2015 Name:  Dorothy Bartlett Medical record number: [id number] DOB/Age:  09/17/1926  81 y.o. female  [facility]  Primary Cardiologist:  Dr. Verdis PrimeHenry Smith  Primary Physician:   [place] [facility] [place]  Chief complaint/reason for admission: Weakness, passed out  HPI:  This 81 year old female has a complex history of coronary artery disease, hypertensive heart disease, severe peripheral vascular disease, obesity, and diabetes with renal manifestations.  She has mild asthma.  She has a history of severe peripheral vascular disease with recent limb threatening ischemia and last year had bypass grafting to both her right and her left leg as well as amputation of a toe on the right.  She has gotten along fairly well and follows long at home.  She had an admission for congestive heart failure after one of her procedures and had an ejection fraction of around 45% with LVH and some inferior hypokinesis.  She has a previous history of a high risk nuclear stress test.  She was in her usual state of health but had an episode were she would become weak and fall to the ground when she would stand up and had a syncopal episode yesterday.  She was brought to the emergency room by her family today or she was found to be in complete heart block with an escape rate of around 28 and a right bundle branch block pattern.  She denied any anginal type chest pain and had minimal elevation of troponin.  She was found to have worsening of her renal failure however with a creatinine of 5.4 with a previous creatinine of 2.8 at the time of her last arteriography in October that showed her vein graft to be patent bilaterally but with a stenosis involving the graft to the left leg.  She has never had syncope before.  She had a good blood pressure in the emergency room.  She currently is mentating well and denies shortness of breath or chest  discomfort.  She has some mild chronic edema.   Past Medical History  Diagnosis Date  . Hyperlipidemia   . Carotid stenosis     Carotid US (12/2013): < 60% RCA, < 40% LICA.  [name]. CKD (chronic kidney disease), stage IV     notes 02/22/2014; Dr. Hyman HopesWebb  . Hypertension   . Coronary artery disease     a. s/p MI 1995 tx w/ PTCA to Dx, Ramus, ap LAD;  b. LHC (11/2004):  Inferior HK, EF 60%, proximal LAD 50%, ostial D1 80%, mid D1 95%, mid LAD 50-60%, distal LAD at the apex 90%, OM1 occluded (fills late by left to left collaterals), RCA 75%. Medical therapy recommended.;  c. Lexiscan Myoview (12/2013):  Prior ant-lat scar with very small area of peri-infarct ischemia in ant wall, EF 40 (high risk)   . Obesity (BMI 30-39.9)   . 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 Type 2 diabetes mellitus with renal manifestations   . Intrinsic asthma 01/28/2008     Mild intermittant asthma, PFT's 01/28/08     . Old anterolateral myocardial infarction   . PVD (peripheral vascular disease) 02/16/2014    2015, right femoral peroneal bypass grafting and left femoral-tibial bypass grafting by Dr. Arbie CookeyEarly for rest pain as well as nonhealing ulcer on toe accompanied with toe amputation  Arteriogram October 2015 with patent graft on the right, 70% stenosis of graft on the left  proximally.   . Chronic combined systolic and diastolic heart failure 04/13/2014     Past Surgical History  Procedure Laterality Date  . Repair of nerve in left arm  Left 2000  . Cardiac catheterization  1995    w/ PTCA Diag 2nd MI  . Femoral-tibial bypass graft Left 02/24/2014    Procedure: BYPASS GRAFT FEMORAL-TIBIAL ARTERY;  Surgeon: Larina Earthly, MD;  [facility] OR;  Service: Vascular;  Laterality: Left;  . Femoral-popliteal bypass graft Right 08/04/2014    Procedure: RIGHT FEMORAL-PERONEAL ARTERY BYPASS GRAFT;  Surgeon: Larina Earthly, MD;  Location: [facility] OR;  Service: Vascular;  Laterality: Right;  . Amputation  Right 08/04/2014    Procedure: AMPUTATION DIGIT-RIGHT 3RD TOE;  Surgeon: Larina Earthly, MD;  Location: [facility] OR;  Service: Vascular;  Laterality: Right;  . Lower extremity angiogram Left 01/08/2014    Procedure: LOWER EXTREMITY ANGIOGRAM;  Surgeon: Sherren Kerns, MD;  Location: [facility] CATH LAB;  Service: Cardiovascular;  Laterality: Left;  . Abdominal angiogram  01/08/2014    Procedure: ABDOMINAL ANGIOGRAM;  Surgeon: Sherren Kerns, MD;  Location: [facility] CATH LAB;  Service: Cardiovascular;;  . Lower extremity angiogram Right 08/03/2014    Procedure: LOWER EXTREMITY ANGIOGRAM;  Surgeon: Nada Libman, MD;  [facility] LAB;  Service: Cardiovascular;  Laterality: Right;  . Abdominal aortagram N/A 10/25/2014    Procedure: ABDOMINAL Ronny Flurry;  Surgeon: Chuck Hint, MD;  Location: [facility] CATH LAB;  Service: Cardiovascular;  Laterality: N/A;   Allergies: has No Known Allergies.   Medications: Prior to Admission medications   Medication Sig Start Date End Date Taking? Authorizing Provider  albuterol (PROVENTIL HFA;VENTOLIN HFA) 108 (90 BASE) MCG/ACT inhaler Inhale 2 puffs into the lungs every 6 (six) hours as needed for wheezing or shortness of breath.   Yes Historical Provider, MD  aspirin 81 MG tablet Take 81 mg by mouth every morning.    Yes Historical Provider, MD  atorvastatin (LIPITOR) 40 MG tablet Take 40 mg by mouth every morning.  01/25/14  Yes Historical Provider, MD  B Complex-C-Folic Acid (RENA-VITE RX) 1 MG TABS Take 1 tablet by mouth every morning.  02/05/14  Yes Historical Provider, MD  carvedilol (COREG) 12.5 MG tablet Take 1 tablet (12.5 mg total) by mouth 2 (two) times daily. 02/04/14  Yes Lyn Records, MD  docusate sodium (COLACE) 100 MG capsule Take 100 mg by mouth 2 (two) times daily.    Yes Historical Provider, MD  furosemide (LASIX) 40 MG tablet Take 1 tablet (40 mg total) by mouth daily. 04/20/14  Yes Beatrice Lecher, PA-C  hydrALAZINE (APRESOLINE) 10 MG tablet Take 10 mg by mouth 3  (three) times daily.   Yes Historical Provider, MD  insulin lispro (HUMALOG) 100 UNIT/ML injection Inject 5 Units into the skin 2 (two) times daily as needed for high blood sugar. Only takes if CBG >250.   Yes Historical Provider, MD  isosorbide mononitrate (IMDUR) 30 MG 24 hr tablet Take 1 tablet (30 mg total) by mouth daily. 04/18/14  Yes Marinda Elk, MD  loratadine (CLARITIN) 10 MG tablet Take 10 mg by mouth daily as needed for allergies.   Yes Historical Provider, MD  Melatonin 3 MG TABS Take 3 mg by mouth at bedtime as needed (for sleep).    Yes Historical Provider, MD  paricalcitol (ZEMPLAR) 1 MCG capsule Take 1 mcg by mouth daily.  04/07/14  Yes Historical Provider, MD  sitaGLIPtin (JANUVIA) 25 MG tablet  Take 25 mg by mouth daily.   Yes Historical Provider, MD    Family History:  Family Status  Relation Status Death Age  . Father Deceased   . Mother Deceased     Social History:   reports that she has never smoked. She has never used smokeless tobacco. She reports that she does not drink alcohol or use illicit drugs.   History   Social History Narrative   Tobacco use cigarettes: Former Smoker   Occupation: Unemployed, retired   Marital Status: Single   Children: 2 daughters   she still lives alone and takes care of her affairs at her house.  Her family helps her with cleaning.  She does not currently drive.   Review of Systems:  She has been obese for many years.  She has some diminished vision.  She has mild dyspnea with exertion.  She has some mild edema bilaterally.  She does not have any urinary incontinence but does have frequency.  She has known severe peripheral vascular disease and previous had rest pain of the legs but does not currently have any pain in her legs following her recent revascularization.  She has a history of asymptomatic carotid artery disease but no recent angina and no recent TIAs. Other than as noted above, the remainder of the review of systems is  normal  Physical Exam: BP 153/45 mmHg  Pulse 30  Resp 11  SpO2 100% General appearance: Short, obese pleasant black female lying on the bed in no acute distress, appears younger than stated age Head: Normocephalic, without obvious abnormality, atraumatic Eyes: conjunctivae/corneas clear. PERRL, EOM's intact. Fundi not examined  Neck: no adenopathy, no carotid bruit, supple, symmetrical, trachea midline and Jugular venous pressure appears elevated at 45 Lungs: clear to auscultation bilaterally Heart: Slow regular rhythm, 1 to 2/6 murmur heard across the aortic valve, no diastolic murmur, normal S1-S2 no S3 Abdomen: soft, non-tender; bowel sounds normal; no masses,  no organomegaly Pelvic: deferred Extremities: Extremities with slight rotation of the ankle, bilateral scars noted on lower extremities with 1-2+ edema. Pulses: Femoral pulses are deep and only 1+.  Peripheral pulses are not palpable in the feet Skin: Skin color, texture, turgor normal. No rashes or lesions Neurologic: Grossly normal  Labs: CBC  Recent Labs  05/08/15 1314 05/08/15 1320  WBC 9.6  --   RBC 3.43*  --   HGB 10.3* 11.6*  HCT 31.8* 34.0*  PLT 157  --   MCV 92.7  --   MCH 30.0  --   MCHC 32.4  --   RDW 14.3  --   LYMPHSABS 2.1  --   MONOABS 0.8  --   EOSABS 0.0  --   BASOSABS 0.0  --    CMP   Recent Labs  05/08/15 1320  NA 138  K 4.5  CL 106  GLUCOSE 190*  BUN 109*  CREATININE 5.40*   Cardiac Panel (last 3 results) Troponin (Point of Care Test)  Recent Labs  05/08/15 1318  TROPIPOC 0.29*    EKG: Either complete heart block or 2-1 heart block with prolonged first-degree block, right bundle branch block, possible old inferior infarction  Radiology: Cardiomegaly, somewhat crowded lung fields   IMPRESSIONS: 1.  Complete heart block but with maintaining blood pressure 2.  Acute superimposed on top of stage IV chronic kidney disease 3.  Coronary artery disease with previous bypass  grafting 4.  Chronic combined systolic and diastolic congestive heart failure 5.  Stage IV  chronic kidney disease previously 6.  Severe peripheral vascular disease with severe lower extremity peripheral vascular disease with revascularization and carotid artery disease 7.  Anemia of renal disease treated with EPO 8.  Old myocardial infarction 9.  Type 2 diabetes with renal disease as well as severe peripheral vascular disease   PLAN: Patient has complete heart block but has preserved blood pressure at the present time.  Her creatinine however is increased at 5.4.  It is unclear whether this is due to poor perfusion or progression of disease.  Requested renal consultation.  She likely will need to have a permanent pacemaker implanted.  The question is whether she would need to have a temporary pacemaker in today or not to help with renal perfusion.  Discontinue carvedilol and follow labs closely.  Repeat echocardiogram.  Signed: Darden PalmerW. Spencer Luva Metzger, Jr. MD [facility]  05/08/2015, 2:26 PM

## 2015-05-08 NOTE — ED Provider Notes (Signed)
CSN: [id number]     Arrival date & time 05/08/15  1246 History   First MD Initiated Contact with Patient 05/08/15 1254     Chief Complaint  Patient presents with  . Atrial Fibrillation     (Consider location/radiation/quality/duration/timing/severity/associated sxs/prior Treatment) HPI Comments: The patient has a known history of myocardial infarction, asthma, chronic congestive heart failure, peripheral arterial disease status post bilateral femoral bypass grafts, presents with generalized weakness which started an undefined amount of time ago, the patient thinks several days but became much worse yesterday and has been persistent through this morning. The patient states that she feels extremely dizzy every time she tries to stand up, she denies chest pain shortness of breath focal weakness fevers chills nausea vomiting diarrhea. She has chronic swelling of the lower extremities. Paramedics found the patient to be severely bradycardic with a pulse of approximately 30, normotensive  Patient is a 81 y.o. female presenting with atrial fibrillation. The history is provided by the patient, medical records and the EMS personnel.  Atrial Fibrillation    Past Medical History  Diagnosis Date  . Hyperlipidemia   . Carotid stenosis     Carotid US (12/2013): < 60% RCA, < 40% LICA.  [name] CKD (chronic kidney disease), stage IV     notes 02/22/2014; Dr. Hyman Hopes  . Hypertension   . Coronary artery disease     a. s/p MI 1995 tx w/ PTCA to Dx, Ramus, ap LAD;  b. LHC (11/2004):  Inferior HK, EF 60%, proximal LAD 50%, ostial D1 80%, mid D1 95%, mid LAD 50-60%, distal LAD at the apex 90%, OM1 occluded (fills late by left to left collaterals), RCA 75%. Medical therapy recommended.;  c. Lexiscan Myoview (12/2013):  Prior ant-lat scar with very small area of peri-infarct ischemia in ant wall, EF 40 (high risk)   . Obesity (BMI 30-39.9)   . 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  Type 2 diabetes mellitus with renal manifestations   . Intrinsic asthma 01/28/2008     Mild intermittant asthma, PFT's 01/28/08     . Old anterolateral myocardial infarction   . PVD (peripheral vascular disease) 02/16/2014    2015, right femoral peroneal bypass grafting and left femoral-tibial bypass grafting by Dr. Arbie Cookey for rest pain as well as nonhealing ulcer on toe accompanied with toe amputation  Arteriogram October 2015 with patent graft on the right, 70% stenosis of graft on the left proximally.   . Chronic combined systolic and diastolic heart failure 04/13/2014   Past Surgical History  Procedure Laterality Date  . Repair of nerve in left arm  Left 2000  . Cardiac catheterization  1995    w/ PTCA Diag 2nd MI  . Femoral-tibial bypass graft Left 02/24/2014    Procedure: BYPASS GRAFT FEMORAL-TIBIAL ARTERY;  Surgeon: Larina Earthly, MD;  Location: [facility] OR;  Service: Vascular;  Laterality: Left;  . Femoral-popliteal bypass graft Right 08/04/2014    Procedure: RIGHT FEMORAL-PERONEAL ARTERY BYPASS GRAFT;  Surgeon: Larina Earthly, MD;  Location: [facility] OR;  Service: Vascular;  Laterality: Right;  . Amputation Right 08/04/2014    Procedure: AMPUTATION DIGIT-RIGHT 3RD TOE;  Surgeon: Larina Earthly, MD;  Location: [facility] OR;  Service: Vascular;  Laterality: Right;  . Lower extremity angiogram Left 01/08/2014    Procedure: LOWER EXTREMITY ANGIOGRAM;  Surgeon: Sherren Kerns, MD;  Location: [facility] CATH LAB;  Service: Cardiovascular;  Laterality: Left;  . Abdominal angiogram  01/08/2014  Procedure: ABDOMINAL ANGIOGRAM;  Surgeon: Sherren Kerns, MD;  Location: [facility] CATH LAB;  Service: Cardiovascular;;  . Lower extremity angiogram Right 08/03/2014    Procedure: LOWER EXTREMITY ANGIOGRAM;  Surgeon: Nada Libman, MD;  Location: [facility] CATH LAB;  Service: Cardiovascular;  Laterality: Right;  . Abdominal aortagram N/A 10/25/2014    Procedure: ABDOMINAL Ronny Flurry;  Surgeon: Chuck Hint, MD;  Location: [facility] CATH LAB;  Service:  Cardiovascular;  Laterality: N/A;   Family History  Problem Relation Age of Onset  . Diabetes Father   . Hypertension Father   . Hypertension Daughter   . Hyperlipidemia Daughter   . Hyperlipidemia Son   . Hypertension Sister    History  Substance Use Topics  . Smoking status: Never Smoker   . Smokeless tobacco: Never Used  . Alcohol Use: No   OB History    No data available     Review of Systems  All other systems reviewed and are negative.     Allergies  Review of patient's allergies indicates no known allergies.  Home Medications   Prior to Admission medications   Medication Sig Start Date End Date Taking? Authorizing Provider  albuterol (PROVENTIL HFA;VENTOLIN HFA) 108 (90 BASE) MCG/ACT inhaler Inhale 2 puffs into the lungs every 6 (six) hours as needed for wheezing or shortness of breath.   Yes Historical Provider, MD  aspirin 81 MG tablet Take 81 mg by mouth every morning.    Yes Historical Provider, MD  atorvastatin (LIPITOR) 40 MG tablet Take 40 mg by mouth every morning.  01/25/14  Yes Historical Provider, MD  B Complex-C-Folic Acid (RENA-VITE RX) 1 MG TABS Take 1 tablet by mouth every morning.  02/05/14  Yes Historical Provider, MD  carvedilol (COREG) 12.5 MG tablet Take 1 tablet (12.5 mg total) by mouth 2 (two) times daily. 02/04/14  Yes Lyn Records, MD  docusate sodium (COLACE) 100 MG capsule Take 100 mg by mouth 2 (two) times daily.    Yes Historical Provider, MD  furosemide (LASIX) 40 MG tablet Take 1 tablet (40 mg total) by mouth daily. 04/20/14  Yes Beatrice Lecher, PA-C  hydrALAZINE (APRESOLINE) 10 MG tablet Take 10 mg by mouth 3 (three) times daily.   Yes Historical Provider, MD  insulin lispro (HUMALOG) 100 UNIT/ML injection Inject 5 Units into the skin 2 (two) times daily as needed for high blood sugar. Only takes if CBG >250.   Yes Historical Provider, MD  isosorbide mononitrate (IMDUR) 30 MG 24 hr tablet Take 1 tablet (30 mg total) by mouth daily. 04/18/14   Yes Marinda Elk, MD  loratadine (CLARITIN) 10 MG tablet Take 10 mg by mouth daily as needed for allergies.   Yes Historical Provider, MD  Melatonin 3 MG TABS Take 3 mg by mouth at bedtime as needed (for sleep).    Yes Historical Provider, MD  paricalcitol (ZEMPLAR) 1 MCG capsule Take 1 mcg by mouth daily.  04/07/14  Yes Historical Provider, MD  sitaGLIPtin (JANUVIA) 25 MG tablet Take 25 mg by mouth daily.   Yes Historical Provider, MD   BP 153/45 mmHg  Pulse 30  Resp 11  SpO2 100% Physical Exam  Constitutional: She appears well-developed and well-nourished. No distress.  HENT:  Head: Normocephalic and atraumatic.  Mouth/Throat: Oropharynx is clear and moist. No oropharyngeal exudate.  Eyes: Conjunctivae and EOM are normal. Pupils are equal, round, and reactive to light. Right eye exhibits no discharge. Left eye exhibits no discharge. No scleral  icterus.  Neck: Normal range of motion. Neck supple. No JVD present. No thyromegaly present.  Cardiovascular: Normal heart sounds and intact distal pulses.  Exam reveals no gallop and no friction rub.   No murmur heard. Bradycardia and 29 bpm, weak pulses at the radial arteries, no JVD  Pulmonary/Chest: Effort normal and breath sounds normal. No respiratory distress. She has no wheezes. She has no rales.  Abdominal: Soft. Bowel sounds are normal. She exhibits no distension and no mass. There is no tenderness.  Musculoskeletal: Normal range of motion. She exhibits edema (bilateral lower extremity edema, right greater than left). She exhibits no tenderness.  Lymphadenopathy:    She has no cervical adenopathy.  Neurological: She is alert. Coordination normal.  Skin: Skin is warm and dry. No rash noted. No erythema.  Psychiatric: She has a normal mood and affect. Her behavior is normal.  Nursing note and vitals reviewed.   ED Course  Procedures (including critical care time) Labs Review Labs Reviewed  CBC WITH DIFFERENTIAL/PLATELET -  Abnormal; Notable for the following:    RBC 3.43 (*)    Hemoglobin 10.3 (*)    HCT 31.8 (*)    All other components within normal limits  PROTIME-INR - Abnormal; Notable for the following:    Prothrombin Time 16.2 (*)    All other components within normal limits  I-STAT TROPOININ, ED - Abnormal; Notable for the following:    Troponin i, poc 0.29 (*)    All other components within normal limits  I-STAT CHEM 8, ED - Abnormal; Notable for the following:    BUN 109 (*)    Creatinine, Ser 5.40 (*)    Glucose, Bld 190 (*)    Hemoglobin 11.6 (*)    HCT 34.0 (*)    All other components within normal limits  APTT  BASIC METABOLIC PANEL  TROPONIN I  I-STAT CG4 LACTIC ACID, ED    Imaging Review Dg Chest Port 1 View  05/08/2015   CLINICAL DATA:  AxO x4. Pt. Complaint of weakness x 2 days, dizziness starting today. Family reports that pt. Had syncopal episode this AM when going to the bathroom. HR 29. Hx CHF/DMH/o HTN, asthma, CHF, diabetes, cardiac cath in 1995  EXAM: PORTABLE CHEST - 1 VIEW  COMPARISON:  08/04/2014  FINDINGS: External leads overlie the patient. Stable cardiomegaly. Relatively low lung volumes with perihilar and bibasilar bronchovascular crowding and prominence, can't exclude central pulmonary vascular congestion. No confluent airspace consolidation. No effusion. Visualized skeletal structures are unremarkable. Atheromatous aorta.  IMPRESSION: 1. Stable cardiomegaly.  Low lung volumes.   Electronically Signed   By: Corlis Leak  Hassell M.D.   On: 05/08/2015 13:22     EKG Interpretation   Date/Time:  Sunday May 08 2015 13:00:33 EDT Ventricular Rate:  29 PR Interval:  568 QRS Duration: 130 QT Interval:  606 QTC Calculation: 421 R Axis:   -87 Text Interpretation:  complete heart block. Right bundle branch block  Abnormal ekg Non-specific ST-t changes Abnormal ekg Since last tracing  heart block now seen. Confirmed by Hyacinth MeekerMILLER  MD, Mahad Newstrom ([id number]) on 05/08/2015  1:10:26 PM      MDM    Final diagnoses:  Weakness  Second degree heart block  Acute on chronic renal failure   Concern for complete heart block, pt needs cardiology consultation - check K, other lytes, known to have renal disease - trop, cardiac monitoring, the pt is critically ill with arrhythmai.   Laboratory workup shows acute on chronic renal failure,  the patient is uremic and creatinine is 5.4, hemoglobin 10.3 and troponin is elevated at 0.3. I have discussed the patient's care with cardiology, they have seen the patient and the plan is for admission to the cardiology service. I have had the temporary pacing pads on her, we have not had to use them as she has maintained a stable blood pressure, she is somnolent but easily arousable and follows commands without difficulty.  Chest x-ray overall unremarkable, the patient is critically ill and will need potential pacemaker placement if she does not resolve with removal of her beta blocker. Her arrhythmia did not improve with atropine. Multiple re-evaluations, cardiology consultation, patient will need higher level of care.  CRITICAL CARE Performed by: Vida RollerMILLER,Oronde Hallenbeck D Total critical care time: 35 Critical care time was exclusive of separately billable procedures and treating other patients. Critical care was necessary to treat or prevent imminent or life-threatening deterioration. Critical care was time spent personally by me on the following activities: development of treatment plan with patient and/or surrogate as well as nursing, discussions with consultants, evaluation of patient's response to treatment, examination of patient, obtaining history from patient or surrogate, ordering and performing treatments and interventions, ordering and review of laboratory studies, ordering and review of radiographic studies, pulse oximetry and re-evaluation of patient's condition.   Eber HongBrian Rikia Sukhu, MD 05/08/15 1430

## 2015-05-08 NOTE — ED Notes (Signed)
Dr. Tilley at bedside. 

## 2015-05-08 NOTE — ED Notes (Signed)
Pt. Is from home. AxO x4. Pt. Complaint of weakness x 2 days, dizziness starting today. Family reports that pt. Had syncopal episode this AM when going to the bathroom. HR 29. Hx CHF/DM

## 2015-05-09 ENCOUNTER — Ambulatory Visit (HOSPITAL_COMMUNITY): Payer: Medicare Other

## 2015-05-09 ENCOUNTER — Inpatient Hospital Stay (HOSPITAL_COMMUNITY): Payer: Medicare Other

## 2015-05-09 ENCOUNTER — Encounter (HOSPITAL_COMMUNITY)
Admission: EM | Disposition: A | Discharge status: Skilled Nursing Facility | Payer: Self-pay | Source: Home / Self Care | Attending: Interventional Cardiology

## 2015-05-09 DIAGNOSIS — N039 Chronic nephritic syndrome with unspecified morphologic changes: Secondary | ICD-10-CM

## 2015-05-09 DIAGNOSIS — R7989 Other specified abnormal findings of blood chemistry: Secondary | ICD-10-CM

## 2015-05-09 DIAGNOSIS — I442 Atrioventricular block, complete: Principal | ICD-10-CM

## 2015-05-09 DIAGNOSIS — R778 Other specified abnormalities of plasma proteins: Secondary | ICD-10-CM

## 2015-05-09 DIAGNOSIS — R55 Syncope and collapse: Secondary | ICD-10-CM

## 2015-05-09 DIAGNOSIS — N184 Chronic kidney disease, stage 4 (severe): Secondary | ICD-10-CM

## 2015-05-09 DIAGNOSIS — D631 Anemia in chronic kidney disease: Secondary | ICD-10-CM

## 2015-05-09 DIAGNOSIS — N179 Acute kidney failure, unspecified: Secondary | ICD-10-CM

## 2015-05-09 LAB — CBC
HEMATOCRIT: 29.1 % — AB (ref 36.0–46.0)
HEMOGLOBIN: 9.2 g/dL — AB (ref 12.0–15.0)
MCH: 29.2 pg (ref 26.0–34.0)
MCHC: 31.6 g/dL (ref 30.0–36.0)
MCV: 92.4 fL (ref 78.0–100.0)
Platelets: 144 10*3/uL — ABNORMAL LOW (ref 150–400)
RBC: 3.15 MIL/uL — AB (ref 3.87–5.11)
RDW: 14.4 % (ref 11.5–15.5)
WBC: 9.2 10*3/uL (ref 4.0–10.5)

## 2015-05-09 LAB — HEMOGLOBIN A1C
Hgb A1c MFr Bld: 5.8 % — ABNORMAL HIGH (ref 4.8–5.6)
Mean Plasma Glucose: 120 mg/dL

## 2015-05-09 LAB — TROPONIN I: TROPONIN I: 0.54 ng/mL — AB (ref <0.031)

## 2015-05-09 LAB — BASIC METABOLIC PANEL
ANION GAP: 12 (ref 5–15)
BUN: 111 mg/dL — ABNORMAL HIGH (ref 6–20)
CALCIUM: 9 mg/dL (ref 8.9–10.3)
CO2: 19 mmol/L — AB (ref 22–32)
Chloride: 107 mmol/L (ref 101–111)
Creatinine, Ser: 5.71 mg/dL — ABNORMAL HIGH (ref 0.44–1.00)
GFR calc Af Amer: 7 mL/min — ABNORMAL LOW (ref >60)
GFR, EST NON AFRICAN AMERICAN: 6 mL/min — AB (ref >60)
GLUCOSE: 165 mg/dL — AB (ref 70–99)
POTASSIUM: 4 mmol/L (ref 3.5–5.1)
Sodium: 138 mmol/L (ref 135–145)

## 2015-05-09 LAB — GLUCOSE, CAPILLARY
GLUCOSE-CAPILLARY: 158 mg/dL — AB (ref 70–99)
Glucose-Capillary: 180 mg/dL — ABNORMAL HIGH (ref 70–99)
Glucose-Capillary: 154 mg/dL — ABNORMAL HIGH (ref 70–99)
Glucose-Capillary: 131 mg/dL — ABNORMAL HIGH (ref 70–99)

## 2015-05-09 SURGERY — PACEMAKER IMPLANT
Anesthesia: LOCAL

## 2015-05-09 MED ORDER — SODIUM CHLORIDE 0.9 % IR SOLN
80.0000 mg | Status: DC
  Filled 2015-05-09 (×2): qty 2

## 2015-05-09 MED ORDER — SODIUM CHLORIDE 0.9 % IV SOLN
INTRAVENOUS | Status: DC
  Administered 2015-05-10: 06:00:00 via INTRAVENOUS

## 2015-05-09 MED ORDER — CEFAZOLIN SODIUM-DEXTROSE 2-3 GM-% IV SOLR
2.0000 g | INTRAVENOUS | Status: DC
  Filled 2015-05-09 (×2): qty 50

## 2015-05-09 NOTE — Progress Notes (Signed)
Patient Name: Dorothy FraiseCorine H Crisman Date of Encounter: 05/09/2015     Principal Problem:   Complete heart block Active Problems:   Acute renal failure superimposed on stage 4 chronic kidney disease   Pre-syncope   Hypertensive heart disease   CAD (coronary artery disease), native coronary artery   Chronic combined systolic and diastolic heart failure   Type 2 diabetes mellitus with renal manifestations   Elevated troponin   Hyperlipidemia   PVD (peripheral vascular disease)   Anemia due to pre-end-stage renal disease treated with erythropoietin   SUBJECTIVE  No recurrent presyncope overnight.  No c/p or sob.  Remains in CHB with rates hovering around 28-30.    CURRENT MEDS . aspirin EC  81 mg Oral Daily  . atorvastatin  40 mg Oral q morning - 10a  . docusate sodium  100 mg Oral BID  . heparin  5,000 Units Subcutaneous 3 times per day  . insulin aspart  0-15 Units Subcutaneous TID WC  . isosorbide mononitrate  30 mg Oral Daily  . linagliptin  5 mg Oral Daily  . paricalcitol  1 mcg Oral Daily    OBJECTIVE  Filed Vitals:   05/08/15 2359 05/09/15 0200 05/09/15 0500 05/09/15 0730  BP: 116/42 141/40 144/39   Pulse: 32 32 30   Temp: 98.7 F (37.1 C)  98.4 F (36.9 C) 98 F (36.7 C)  TempSrc: Oral   Oral  Resp: 17 11 12    Height:      Weight:   182 lb 11.2 oz (82.872 kg)   SpO2: 97% 98% 99%     Intake/Output Summary (Last 24 hours) at 05/09/15 0815 Last data filed at 05/09/15 0150  Gross per 24 hour  Intake    620 ml  Output    350 ml  Net    270 ml   Filed Weights   05/08/15 1648 05/09/15 0500  Weight: 186 lb (84.369 kg) 182 lb 11.2 oz (82.872 kg)    PHYSICAL EXAM  General: Pleasant, NAD. Neuro: Alert and oriented X 3. Moves all extremities spontaneously. Psych: Normal affect. HEENT:  Normal  Neck: Supple without bruits or JVD.  Lungs:  Resp regular and unlabored, anterior exp wheezing. Heart: RRR, brady, no s3, s4, or murmurs. Abdomen: Soft, non-tender,  non-distended, BS + x 4.  Extremities: No clubbing, cyanosis.  Trace RLE edema. DP/PT/Radials 1+ and equal bilaterally.  Accessory Clinical Findings  CBC  Recent Labs  05/08/15 1314 05/08/15 1320 05/08/15 1650  WBC 9.6  --  9.3  NEUTROABS 6.7  --   --   HGB 10.3* 11.6* 10.2*  HCT 31.8* 34.0* 30.9*  MCV 92.7  --  90.9  PLT 157  --  151   Basic Metabolic Panel  Recent Labs  05/08/15 1314 05/08/15 1320 05/08/15 1650  NA 137 138 137  K 4.4 4.5 4.4  CL 104 106 104  CO2 20*  --  17*  GLUCOSE 195* 190* 144*  BUN 106* 109* 108*  CREATININE 5.44* 5.40* 5.62*  CALCIUM 9.3  --  9.0   Liver Function Tests  Recent Labs  05/08/15 1650  AST 83*  ALT 84*  ALKPHOS 67  BILITOT 0.6  PROT 6.6  ALBUMIN 3.6   Cardiac Enzymes  Recent Labs  05/08/15 1650 05/08/15 2140 05/09/15 0341  TROPONINI 0.53* 0.48* 0.54*   Thyroid Function Tests  Recent Labs  05/08/15 1650  TSH 2.772    TELE  Chb, 20's to 30's, occas pvc,  brief runs of nsvt.  Radiology/Studies  Dg Chest Port 1 View  05/08/2015   CLINICAL DATA:  AxO x4. Pt. Complaint of weakness x 2 days, dizziness starting today. Family reports that pt. Had syncopal episode this AM when going to the bathroom. HR 29. Hx CHF/DMH/o HTN, asthma, CHF, diabetes, cardiac cath in 1995  EXAM: PORTABLE CHEST - 1 VIEW  COMPARISON:  08/04/2014  FINDINGS: External leads overlie the patient. Stable cardiomegaly. Relatively low lung volumes with perihilar and bibasilar bronchovascular crowding and prominence, can't exclude central pulmonary vascular congestion. No confluent airspace consolidation. No effusion. Visualized skeletal structures are unremarkable. Atheromatous aorta.  IMPRESSION: 1. Stable cardiomegaly.  Low lung volumes.   Electronically Signed   By: Corlis Leak  Hassell M.D.   On: 05/08/2015 13:22    ASSESSMENT AND PLAN  1.  Complete Heart Block/pre-syncope:  Pt presented with weakness and presyncope and was found to have chb in the 20's.   Hemodynamically stable w/o recurrent Ss overnight.  Remains bradycardic.  Home coreg on hold since yesterday.  Lytes/tsh ok.  No clearly reversible causes.  EP to see today.  Keep NPO for probable pacer.  Will order echo.  2.  CAD/Elevated troponin:  No c/p.  Troponin elevated in setting of #1 along with acute on chronic renal failure.  Troponin trend is flat.  Suspect demand ischemia.  Last cath in 2005 showed moderate, diffuse dzs with more severe dzs of the D1, apical LAD, OM1 (100%), and a 75% RCA.  Will check echo to re-eval LV fxn (45-50% by echo in 01/2014).  No bb in setting of above.  Cont asa, statin, nitrate.  With acute on chronic renal failure and creat of 5.62, she is not a candidate for an invasive evaluation.  3.  Acute on chronic stage IV CKD:  In setting of #1.  Appreciate nephrology recs.  She is not interested in dialysis.  4.  Essential HTN:  Stable.  5.  HL:  Cont statin.  LFT's mildly elevated in setting of #1/poor perfusion.  6.  DMII:  Cont home meds/SSI.  7.  Asthma:  Chronic.  Cont inhalers.  She does have some anterior wheezing this AM.  Signed, Nicolasa Duckinghristopher Berge NP  I have examined the patient and reviewed assessment and plan and discussed with patient.  Agree with above as stated.  Can hopefully get pacer later today. CRI. Refusing dialysis. Worsening cardiac output may have more impact on renal function in this patient.  EP MD to see later today.    VARANASI,JAYADEEP S.

## 2015-05-09 NOTE — Consult Note (Signed)
ELECTROPHYSIOLOGY CONSULT NOTE   Patient ID: Claretta FraiseCorine H Littler MRN: [id number] DOB/AGE: [birth year] 81 y.o.  Admit date: 05/08/2015  Primary Physician   Dorris SinghBRADLEY, CANDACE, DO Primary Cardiologist   Dr. Verdis PrimeHenry Smith Reason for Consultation   CHB possible PM insertion  HPI: Shanira Rushie ChestnutH Haselton is a 81 y.o. female with a history of CAD, HTN, PVD s/p R toe amputation and bilateral femoral bypass grafting, obesity, DM, CKD stage IV, chronic mixed S/D CHF (EF 45-50%) who [facility] yesterday with weakness and dizziness x2 days and a syncopal episode yesterday AM.  In ED was found to have complete heart block w VR of around 28 bpm. She has been taking coreg 12.5 mg twice daily with her last dose about 30 hours ago.  She had a MI in 1995 tx'd w/ PTCA to Dx, Ramus. She underwent LHC (11/2004) which revealed inferior HK, EF 60%, proximal LAD 50%, ostial D1 80%, mid D1 95%, mid LAD 50-60%, distal LAD at the apex 90%, OM1 occluded (fills late by left to left collaterals), RCA 75%. Medical therapy recommended. Lexiscan Myoview (12/2013):  Prior ant-lat scar with very small area of peri-infarct ischemia in ant wall, EF 40 (high risk)  Her baseline creat from recent office records in March '16 was around 3.5. Yesterday her creat was noted to be 5.5 with BUN 100. She was seen by nephrology who suspected some decreased perfusion from the bradycardia that should improve with pacing. She has previously refused dialysis and still refusing. Her home Coreg 12.5mg  BID has been held. She has received some light IVF's. Today she is lying in bed with no complaints. She feels well and has no CP or SOB. No dizziness, weakness or further syncope.     Past Medical History  Diagnosis Date  . Hyperlipidemia   . Carotid stenosis     Carotid US (12/2013): < 60% RCA, < 40% LICA.  [name]. CKD (chronic kidney disease), stage IV     notes 02/22/2014; Dr. Hyman HopesWebb  . Hypertension   . Coronary artery disease     a. s/p MI 1995 tx w/ PTCA  to Dx, Ramus, ap LAD;  b. LHC (11/2004):  Inferior HK, EF 60%, proximal LAD 50%, ostial D1 80%, mid D1 95%, mid LAD 50-60%, distal LAD at the apex 90%, OM1 occluded (fills late by left to left collaterals), RCA 75%. Medical therapy recommended.;  c. Lexiscan Myoview (12/2013):  Prior ant-lat scar with very small area of peri-infarct ischemia in ant wall, EF 40 (high risk)   . Obesity (BMI 30-39.9)   . 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 Type 2 diabetes mellitus with renal manifestations   . Intrinsic asthma 01/28/2008     Mild intermittant asthma, PFT's 01/28/08     . Old anterolateral myocardial infarction   . PVD (peripheral vascular disease) 02/16/2014    2015, right femoral peroneal bypass grafting and left femoral-tibial bypass grafting by Dr. Arbie CookeyEarly for rest pain as well as nonhealing ulcer on toe accompanied with toe amputation  Arteriogram October 2015 with patent graft on the right, 70% stenosis of graft on the left proximally.   . Chronic combined systolic and diastolic heart failure 04/13/2014     Past Surgical History  Procedure Laterality Date  . Repair of nerve in left arm  Left 2000  . Cardiac catheterization  1995    w/ PTCA Diag 2nd MI  . Femoral-tibial bypass graft Left 02/24/2014  Procedure: BYPASS GRAFT FEMORAL-TIBIAL ARTERY;  Surgeon: Larina Earthly, MD;  Location: [facility] OR;  Service: Vascular;  Laterality: Left;  . Femoral-popliteal bypass graft Right 08/04/2014    Procedure: RIGHT FEMORAL-PERONEAL ARTERY BYPASS GRAFT;  Surgeon: Larina Earthly, MD;  Location: [facility] OR;  Service: Vascular;  Laterality: Right;  . Amputation Right 08/04/2014    Procedure: AMPUTATION DIGIT-RIGHT 3RD TOE;  Surgeon: Larina Earthly, MD;  Location: [facility] OR;  Service: Vascular;  Laterality: Right;  . Lower extremity angiogram Left 01/08/2014    Procedure: LOWER EXTREMITY ANGIOGRAM;  Surgeon: Sherren Kerns, MD;  Location: [facility] CATH LAB;  Service: Cardiovascular;  Laterality:  Left;  . Abdominal angiogram  01/08/2014    Procedure: ABDOMINAL ANGIOGRAM;  Surgeon: Sherren Kerns, MD;  Location: [facility] CATH LAB;  Service: Cardiovascular;;  . Lower extremity angiogram Right 08/03/2014    Procedure: LOWER EXTREMITY ANGIOGRAM;  Surgeon: Nada Libman, MD;  Location: [facility] CATH LAB;  Service: Cardiovascular;  Laterality: Right;  . Abdominal aortagram N/A 10/25/2014    Procedure: ABDOMINAL Ronny Flurry;  Surgeon: Chuck Hint, MD;  Location: [facility] CATH LAB;  Service: Cardiovascular;  Laterality: N/A;    No Known Allergies  I have reviewed the patient's current medications . aspirin EC  81 mg Oral Daily  . atorvastatin  40 mg Oral q morning - 10a  . docusate sodium  100 mg Oral BID  . heparin  5,000 Units Subcutaneous 3 times per day  . insulin aspart  0-15 Units Subcutaneous TID WC  . isosorbide mononitrate  30 mg Oral Daily  . linagliptin  5 mg Oral Daily  . paricalcitol  1 mcg Oral Daily     acetaminophen, albuterol, nitroGLYCERIN, ondansetron (ZOFRAN) IV  Prior to Admission medications   Medication Sig Start Date End Date Taking? Authorizing Provider  albuterol (PROVENTIL HFA;VENTOLIN HFA) 108 (90 BASE) MCG/ACT inhaler Inhale 2 puffs into the lungs every 6 (six) hours as needed for wheezing or shortness of breath.   Yes Historical Provider, MD  aspirin 81 MG tablet Take 81 mg by mouth every morning.    Yes Historical Provider, MD  atorvastatin (LIPITOR) 40 MG tablet Take 40 mg by mouth every morning.  01/25/14  Yes Historical Provider, MD  B Complex-C-Folic Acid (RENA-VITE RX) 1 MG TABS Take 1 tablet by mouth every morning.  02/05/14  Yes Historical Provider, MD  carvedilol (COREG) 12.5 MG tablet Take 1 tablet (12.5 mg total) by mouth 2 (two) times daily. 02/04/14  Yes Lyn Records, MD  docusate sodium (COLACE) 100 MG capsule Take 100 mg by mouth 2 (two) times daily.    Yes Historical Provider, MD  furosemide (LASIX) 40 MG tablet Take 1 tablet (40 mg total) by mouth  daily. 04/20/14  Yes Beatrice Lecher, PA-C  hydrALAZINE (APRESOLINE) 10 MG tablet Take 10 mg by mouth 3 (three) times daily.   Yes Historical Provider, MD  insulin lispro (HUMALOG) 100 UNIT/ML injection Inject 5 Units into the skin 2 (two) times daily as needed for high blood sugar. Only takes if CBG >250.   Yes Historical Provider, MD  isosorbide mononitrate (IMDUR) 30 MG 24 hr tablet Take 1 tablet (30 mg total) by mouth daily. 04/18/14  Yes Marinda Elk, MD  loratadine (CLARITIN) 10 MG tablet Take 10 mg by mouth daily as needed for allergies.   Yes Historical Provider, MD  Melatonin 3 MG TABS Take 3 mg by mouth at bedtime as needed (for sleep).  Yes Historical Provider, MD  paricalcitol (ZEMPLAR) 1 MCG capsule Take 1 mcg by mouth daily.  04/07/14  Yes Historical Provider, MD  sitaGLIPtin (JANUVIA) 25 MG tablet Take 25 mg by mouth daily.   Yes Historical Provider, MD     History   Social History  . Marital Status: Widowed    Spouse Name: N/A  . Number of Children: N/A  . Years of Education: N/A   Occupational History  . Retired    Social History Main Topics  . Smoking status: Never Smoker   . Smokeless tobacco: Never Used  . Alcohol Use: No  . Drug Use: No  . Sexual Activity: No   Other Topics Concern  . Not on file   Social History Narrative   Tobacco use cigarettes: Former Smoker   Occupation: Unemployed, retired   Marital Status: Single   Children: 2 daughters    Family Status  Relation Status Death Age  . Father Deceased   . Mother Deceased    Family History  Problem Relation Age of Onset  . Diabetes Father   . Hypertension Father   . Hypertension Daughter   . Hyperlipidemia Daughter   . Hyperlipidemia Son   . Hypertension Sister      ROS:  Full 14 point review of systems complete and found to be negative unless listed above.  Physical Exam: Blood pressure 144/39, pulse 30, temperature 98 F (36.7 C), temperature source Oral, resp. rate 12, height 4'  11" (1.499 m), weight 182 lb 11.2 oz (82.872 kg), SpO2 99 %.  General: Well developed, well nourished, female in no acute distress. Obese, chronically ill appearing Head: Eyes PERRLA, No xanthomas.   Normocephalic and atraumatic, oropharynx without edema or exudate. Lungs: CTAB. Mild anterior wheezing  Heart: HRRR S1 S2, no rub/gallop, Heart irregular rate and rhythm with S1, S2  murmur. pulses are 2+ extrem.   Neck: No carotid bruits. No lymphadenopathy.  No JVD. Abdomen: Bowel sounds present, abdomen soft and non-tender without masses or hernias noted. Msk:  No spine or cva tenderness. No weakness, no joint deformities or effusions. Extremities: No clubbing or cyanosis. No  edema.  Neuro: Alert and oriented X 3. No focal deficits noted. Psych:  Good affect, responds appropriately Skin: No rashes or lesions noted.  Labs:   Lab Results  Component Value Date   WBC 9.3 05/08/2015   HGB 10.2* 05/08/2015   HCT 30.9* 05/08/2015   MCV 90.9 05/08/2015   PLT 151 05/08/2015    Recent Labs  05/08/15 1314  INR 1.29    Recent Labs Lab 05/08/15 1650  NA 137  K 4.4  CL 104  CO2 17*  BUN 108*  CREATININE 5.62*  CALCIUM 9.0  PROT 6.6  BILITOT 0.6  ALKPHOS 67  ALT 84*  AST 83*  GLUCOSE 144*  ALBUMIN 3.6     Recent Labs  05/08/15 1314 05/08/15 1650 05/08/15 2140 05/09/15 0341  TROPONINI 0.64* 0.53* 0.48* 0.54*    Recent Labs  05/08/15 1318  TROPIPOC 0.29*    TSH  Date/Time Value Ref Range Status  05/08/2015 04:50 PM 2.772 0.350 - 4.500 uIU/mL Final     Echo: 01/2014 Study Conclusions - Left ventricle: The cavity size was normal. Wall thickness was increased in a pattern of mild LVH. Systolic function was mildly reduced. The estimated ejection fraction was in the range of 45% to 50%. There is hypokinesis of the inferior myocardium. Doppler parameters are consistent with abnormal left ventricular relaxation (  grade 1 diastolic dysfunction). Doppler  parameters are consistent with high ventricular filling pressure. - Aortic valve: Mean gradient: 8mm Hg (S). Peak gradient: 14mm Hg (S). - Mitral valve: Mild regurgitation. - Left atrium: The atrium was mildly dilated. Anterior-posterior dimension: 44mm (2D). - Pulmonary arteries: Systolic pressure was mildly increased. PA peak pressure: 41mm Hg (S).   ECG:  HR 29 complete heart block. Right bundle branch block Abnormal ekg Non-specific ST-t changes   Radiology:  Dg Chest Port 1 View  05/08/2015   CLINICAL DATA:  AxO x4. Pt. Complaint of weakness x 2 days, dizziness starting today. Family reports that pt. Had syncopal episode this AM when going to the bathroom. HR 29. Hx CHF/DMH/o HTN, asthma, CHF, diabetes, cardiac cath in 1995  EXAM: PORTABLE CHEST - 1 VIEW  COMPARISON:  08/04/2014  FINDINGS: External leads overlie the patient. Stable cardiomegaly. Relatively low lung volumes with perihilar and bibasilar bronchovascular crowding and prominence, can't exclude central pulmonary vascular congestion. No confluent airspace consolidation. No effusion. Visualized skeletal structures are unremarkable. Atheromatous aorta.  IMPRESSION: 1. Stable cardiomegaly.  Low lung volumes.   Electronically Signed   By: Corlis Leak M.D.   On: 05/08/2015 13:22    ASSESSMENT AND PLAN:    Principal Problem:   Complete heart block Active Problems:   Hypertensive heart disease   CAD (coronary artery disease), native coronary artery   Chronic combined systolic and diastolic heart failure   Type 2 diabetes mellitus with renal manifestations   Acute renal failure superimposed on stage 4 chronic kidney disease  Nashly H Ackman is a 81 y.o. female with a history of CAD, HTN, PVD s/p R toe amputation and bilateral femoral bypass grafting, obesity, DM, CKD stage IV, chronic mixed S/D CHF (EF 45-50%) who presented to [facility] yesterday with weakness and dizziness x2 days and a syncopal episode yesterday AM.  In ED was  found to have complete heart block w VR of around 28 bpm.   Complete Heart Block/pre-syncope: Pt presented with weakness and presyncope and was found to have chb in the 20's. Hemodynamically stable w/o recurrent Ss overnight. Remains bradycardic. Home coreg on hold since yesterday. Lytes/tsh ok. She was on Coreg 12.5 mg twice daily.  -- 2D ECHO ordered. Last EF 45-50%. 2D ECHO today with EF 40-45%. Will give a total of 48 hours to washout her coreg. If conduction does not return,she will need a DDD PM, and I'll defer BiV PM decision until the day of implant.  CAD/Elevated troponin: No c/p. Troponin elevated in setting of #1 along with acute on chronic renal failure. Troponin trend is flat. Suspect demand ischemia. Last cath in 2005 showed moderate, diffuse dzs with more severe dzs of the D1, apical LAD, OM1 (100%), and a 75% RCA. Will check echo to re-eval LV fxn (45-50% by echo in 01/2014). No bb in setting of above. Cont asa, statin, nitrate. With acute on chronic renal failure and creat of 5.62, she is not a candidate for an invasive evaluation. She is not a cardiac cath candidate as she will not consider HD in the event of acute kidney injury from IV contrast.   Acute on chronic stage IV CKD: In setting of #1. Appreciate nephrology recs. She is not interested in dialysis.  Essential HTN: Stable.  HLD: Cont statin. LFT's mildly elevated in setting of #1/poor perfusion.  DMII: Cont home meds/SSI.  Asthma: Chronic. Cont inhalers. She does have some anterior wheezing this AM.   Signed: Cline Crock  Delphia Grates 05/09/2015 7:52 AM  Pager [phone]  EP Attending  Patient seen and examined. I agree with the findings above by Carlean Jews, PA-C. She has complete heart block on coreg and acute on chronic renal insufficiency. Will give her 48 hours of washout on her coreg. If heart block persists after 48 hours then PPM will be required. BiV PPM is an option but will defer  decision on this to Dr. Johney Frame who will likely be doing her procedure tomorrow.  Lewayne Bunting, MD

## 2015-05-09 NOTE — Progress Notes (Signed)
UR COMPLETED  

## 2015-05-09 NOTE — Progress Notes (Signed)
*  PRELIMINARY RESULTS* Echocardiogram 2D Echocardiogram has been performed.  Jeryl Columbialliott, Joyia Riehle 05/09/2015, 10:16 AM

## 2015-05-10 ENCOUNTER — Encounter (HOSPITAL_COMMUNITY)
Admission: EM | Disposition: A | Discharge status: Skilled Nursing Facility | Payer: Medicare Other | Source: Home / Self Care | Attending: Interventional Cardiology

## 2015-05-10 ENCOUNTER — Encounter (HOSPITAL_COMMUNITY): Payer: Self-pay | Admitting: Nurse Practitioner

## 2015-05-10 DIAGNOSIS — I5042 Chronic combined systolic (congestive) and diastolic (congestive) heart failure: Secondary | ICD-10-CM

## 2015-05-10 DIAGNOSIS — I442 Atrioventricular block, complete: Secondary | ICD-10-CM

## 2015-05-10 HISTORY — PX: EP IMPLANTABLE DEVICE: SHX172B

## 2015-05-10 LAB — BASIC METABOLIC PANEL
ANION GAP: 16 — AB (ref 5–15)
BUN: 117 mg/dL — ABNORMAL HIGH (ref 6–20)
CALCIUM: 9.1 mg/dL (ref 8.9–10.3)
CHLORIDE: 106 mmol/L (ref 101–111)
CO2: 18 mmol/L — ABNORMAL LOW (ref 22–32)
Creatinine, Ser: 5.78 mg/dL — ABNORMAL HIGH (ref 0.44–1.00)
GFR calc Af Amer: 7 mL/min — ABNORMAL LOW (ref >60)
GFR calc non Af Amer: 6 mL/min — ABNORMAL LOW (ref >60)
Glucose, Bld: 113 mg/dL — ABNORMAL HIGH (ref 70–99)
Potassium: 4.4 mmol/L (ref 3.5–5.1)
SODIUM: 140 mmol/L (ref 135–145)

## 2015-05-10 LAB — GLUCOSE, CAPILLARY
Glucose-Capillary: 107 mg/dL — ABNORMAL HIGH (ref 70–99)
Glucose-Capillary: 99 mg/dL (ref 70–99)
Glucose-Capillary: 133 mg/dL — ABNORMAL HIGH (ref 70–99)
Glucose-Capillary: 164 mg/dL — ABNORMAL HIGH (ref 70–99)

## 2015-05-10 SURGERY — PACEMAKER IMPLANT
Anesthesia: LOCAL

## 2015-05-10 MED ORDER — SODIUM CHLORIDE 0.9 % IJ SOLN
3.0000 mL | Freq: Two times a day (BID) | INTRAMUSCULAR | Status: DC
Start: 2015-05-10 — End: 2015-05-15
  Administered 2015-05-10 – 2015-05-15 (×10): 3 mL via INTRAVENOUS

## 2015-05-10 MED ORDER — SODIUM CHLORIDE 0.9 % IJ SOLN
3.0000 mL | INTRAMUSCULAR | Status: DC | PRN
  Administered 2015-05-14: 3 mL via INTRAVENOUS
  Filled 2015-05-10: qty 3

## 2015-05-10 MED ORDER — CEFAZOLIN SODIUM-DEXTROSE 2-3 GM-% IV SOLR
INTRAVENOUS | Status: AC
  Filled 2015-05-10: qty 50

## 2015-05-10 MED ORDER — ACETAMINOPHEN 325 MG PO TABS: 325.0000 mg | ORAL_TABLET | ORAL | Status: DC | PRN

## 2015-05-10 MED ORDER — CARVEDILOL 12.5 MG PO TABS
12.5000 mg | ORAL_TABLET | Freq: Two times a day (BID) | ORAL | Status: DC
  Administered 2015-05-10 – 2015-05-15 (×10): 12.5 mg via ORAL
  Filled 2015-05-10: qty 1
  Filled 2015-05-10: qty 2
  Filled 2015-05-10 (×8): qty 1

## 2015-05-10 MED ORDER — DEXTROSE 5 % IV SOLN
2.0000 g | INTRAVENOUS | Status: DC | PRN
  Administered 2015-05-10: 2 g via INTRAVENOUS

## 2015-05-10 MED ORDER — SODIUM CHLORIDE 0.9 % IV SOLN: 250.0000 mL | INTRAVENOUS | Status: DC | PRN

## 2015-05-10 MED ORDER — ONDANSETRON HCL 4 MG/2ML IJ SOLN: 4.0000 mg | Freq: Four times a day (QID) | INTRAMUSCULAR | Status: DC | PRN

## 2015-05-10 MED ORDER — HEPARIN (PORCINE) IN NACL 2-0.9 UNIT/ML-% IJ SOLN
INTRAMUSCULAR | Status: AC
  Filled 2015-05-10: qty 500

## 2015-05-10 MED ORDER — HYDROCODONE-ACETAMINOPHEN 5-325 MG PO TABS: 1.0000 | ORAL_TABLET | ORAL | Status: DC | PRN

## 2015-05-10 MED ORDER — LIDOCAINE HCL (PF) 1 % IJ SOLN
INTRAMUSCULAR | Status: AC
  Filled 2015-05-10: qty 60

## 2015-05-10 MED ORDER — CEFAZOLIN SODIUM 1-5 GM-% IV SOLN
1.0000 g | Freq: Once | INTRAVENOUS | Status: AC
  Administered 2015-05-11: 1 g via INTRAVENOUS
  Filled 2015-05-10: qty 50

## 2015-05-10 MED ORDER — FUROSEMIDE 10 MG/ML IJ SOLN
40.0000 mg | Freq: Once | INTRAMUSCULAR | Status: AC
  Administered 2015-05-10: 40 mg via INTRAVENOUS
  Filled 2015-05-10: qty 4

## 2015-05-10 SURGICAL SUPPLY — 7 items
CABLE SURGICAL S-101-97-12 (CABLE) ×2 IMPLANT
LEAD ISOFLEX OPT 1948-58CM (Lead) ×1 IMPLANT
LEAD TENDRIL SDX 2088TC-46CM (Lead) ×1 IMPLANT
PAD DEFIB LIFELINK (PAD) ×2 IMPLANT
PPM ASSURITY DR PM2240 (Pacemaker) ×1 IMPLANT
SHEATH CLASSIC 7F (SHEATH) ×2 IMPLANT
TRAY PACEMAKER INSERTION (CUSTOM PROCEDURE TRAY) ×2 IMPLANT

## 2015-05-10 NOTE — Interval H&P Note (Signed)
History and Physical Interval Note:  05/10/2015 2:36 PM  Dorothy Bartlett  has presented today for surgery, with the diagnosis of Heart block  The various methods of treatment have been discussed with the patient and family. After consideration of risks, benefits and other options for treatment, the patient has consented to  Procedure(s): Pacemaker Implant (N/A) as a surgical intervention .  The patient's history has been reviewed, patient examined, no change in status, stable for surgery.  I have reviewed the patient's chart and labs.  Questions were answered to the patient's satisfaction.     Dorothy Bartlett

## 2015-05-10 NOTE — H&P (View-Only) (Signed)
SUBJECTIVE: The patient is stable today.  At this time, she denies chest pain, shortness of breath, or any new concerns.  She has not had further dizziness or syncope.   CURRENT MEDICATIONS: . aspirin EC  81 mg Oral Daily  . atorvastatin  40 mg Oral q morning - 10a  .  ceFAZolin (ANCEF) IV  2 g Intravenous To Cath  . docusate sodium  100 mg Oral BID  . gentamicin irrigation  80 mg Irrigation To Cath  . insulin aspart  0-15 Units Subcutaneous TID WC  . isosorbide mononitrate  30 mg Oral Daily  . linagliptin  5 mg Oral Daily  . paricalcitol  1 mcg Oral Daily   . sodium chloride 50 mL/hr at 05/10/15 0624  . sodium chloride 50 mL/hr at 05/10/15 0623    OBJECTIVE: Physical Exam: Filed Vitals:   05/10/15 0200 05/10/15 0248 05/10/15 0252 05/10/15 0451  BP:    142/52  Pulse: 29 29 30 30   Temp:    98.1 F (36.7 C)  TempSrc:    Oral  Resp: 14 22 19 18   Height:      Weight:    179 lb 3.7 oz (81.3 kg)  SpO2: 100% 100% 100% 100%    Intake/Output Summary (Last 24 hours) at 05/10/15 0818 Last data filed at 05/10/15 [id number]  Gross per 24 hour  Intake      0 ml  Output    200 ml  Net   -200 ml    Telemetry reveals sinus rhythm with complete heart block  GEN- The patient is obese, elderly, chronically ill appearing, alert and oriented x 3 today.   Head- normocephalic, atraumatic Eyes-  Sclera clear, conjunctiva pink Ears- hearing intact Oropharynx- clear Neck- supple, no JVP Lymph- no cervical lymphadenopathy Lungs- Clear to ausculation bilaterally, normal work of breathing Heart- bradycardic regular rate and rhythm, 2/6 murmur GI- soft, NT, ND, + BS Extremities- no clubbing, cyanosis, or edema Skin- no rash or lesion Psych- euthymic mood, full affect Neuro- strength and sensation are intact  LABS: Basic Metabolic Panel:  Recent Labs  96/03/5404/09/16 0901 05/10/15 0321  NA 138 140  K 4.0 4.4  CL 107 106  CO2 19* 18*  GLUCOSE 165* 113*  BUN 111* 117*  CREATININE 5.71*  5.78*  CALCIUM 9.0 9.1   Liver Function Tests:  Recent Labs  05/08/15 1650  AST 83*  ALT 84*  ALKPHOS 67  BILITOT 0.6  PROT 6.6  ALBUMIN 3.6   CBC:  Recent Labs  05/08/15 1314  05/08/15 1650 05/09/15 0901  WBC 9.6  --  9.3 9.2  NEUTROABS 6.7  --   --   --   HGB 10.3*  < > 10.2* 9.2*  HCT 31.8*  < > 30.9* 29.1*  MCV 92.7  --  90.9 92.4  PLT 157  --  151 144*  < > = values in this interval not displayed. Cardiac Enzymes:  Recent Labs  05/08/15 1650 05/08/15 2140 05/09/15 0341  TROPONINI 0.53* 0.48* 0.54*   Hemoglobin A1C:  Recent Labs  05/08/15 1650  HGBA1C 5.8*   Thyroid Function Tests:  Recent Labs  05/08/15 1650  TSH 2.772    RADIOLOGY: Dg Chest Port 1 View 05/08/2015   CLINICAL DATA:  AxO x4. Pt. Complaint of weakness x 2 days, dizziness starting today. Family reports that pt. Had syncopal episode this AM when going to the bathroom. HR 29. Hx CHF/DMH/o HTN, asthma, CHF, diabetes, cardiac cath  in 1995  EXAM: PORTABLE CHEST - 1 VIEW  COMPARISON:  08/04/2014  FINDINGS: External leads overlie the patient. Stable cardiomegaly. Relatively low lung volumes with perihilar and bibasilar bronchovascular crowding and prominence, can't exclude central pulmonary vascular congestion. No confluent airspace consolidation. No effusion. Visualized skeletal structures are unremarkable. Atheromatous aorta.  IMPRESSION: 1. Stable cardiomegaly.  Low lung volumes.   Electronically Signed   By: D  Hassell M.D.   On: 05/08/2015 13:22    ASSESSMENT AND PLAN:  Principal Problem:   Complete heart block Active Problems:   Hyperlipidemia   Hypertensive heart disease   CAD (coronary artery disease), native coronary artery   PVD (peripheral vascular disease)   Chronic combined systolic and diastolic heart failure   Type 2 diabetes mellitus with renal manifestations   Acute renal failure superimposed on stage 4 chronic kidney disease   Anemia due to pre-end-stage renal disease  treated with erythropoietin   Elevated troponin   Pre-syncope  1.  Complete heart block The patient has had persistent heart block despite adequate Coreg washout.  She has had prior conduction system disease with 1st degree AV block, RBBB, LAFB.  With advanced age and renal failure, she is at increased risk for complications from pacemaker and overall long-term mortality is increased.  With significant co-morbidities, would not recommend CRT implantation.  Dr Kayton Dunaj to discuss with patient today.  2.  ESRD She has previously (and currently) refused dialysis Creat increased from baseline, nephrology has seen this admission and feels renal function will improve with AV synchrony  3.  HTN Stable No change required today  4.  Chronic systolic heart failure EF 40-45% this admission No ACE-I/ARB with renal failure No BB currently right now with heart block  Amber Seiler, NP 05/10/2015 8:26 AM  I have seen, examined the patient, and reviewed the above assessment and plan.  Changes to above are made where necessary.  The patient has symptomatic complete heart block.   No reversible causes are found.  She is very ill and her prognosis is poor.  She has declined dialysis.  I have offered a palliative option which she declines.  She would prefer to "press on" and would like to consider PPM implantation though she recognizes that she is at very high risks of complications with this procedure given her advanced age and comorbidities.  Given her fragility, I do not anticipate that she would receive long term benefit from CRT and think that an additional lead may carry additional risks to her.  I would therefore recommend pacemaker implantation at this time.  Risks, benefits, alternatives to pacemaker implantation were discussed in detail with the patient today. The patient understands that the risks include but are not limited to bleeding, infection, pneumothorax, perforation, tamponade, vascular damage,  renal failure, MI, stroke, death,  and lead dislodgement and wishes to proceed at the next available time.   Co Sign: Braylee Bosher, MD 05/10/2015 2:32 PM    

## 2015-05-10 NOTE — Progress Notes (Signed)
SUBJECTIVE: The patient is stable today.  At this time, she denies chest pain, shortness of breath, or any new concerns.  She has not had further dizziness or syncope.   CURRENT MEDICATIONS: . aspirin EC  81 mg Oral Daily  . atorvastatin  40 mg Oral q morning - 10a  .  ceFAZolin (ANCEF) IV  2 g Intravenous To Cath  . docusate sodium  100 mg Oral BID  . gentamicin irrigation  80 mg Irrigation To Cath  . insulin aspart  0-15 Units Subcutaneous TID WC  . isosorbide mononitrate  30 mg Oral Daily  . linagliptin  5 mg Oral Daily  . paricalcitol  1 mcg Oral Daily   . sodium chloride 50 mL/hr at 05/10/15 0624  . sodium chloride 50 mL/hr at 05/10/15 0623    OBJECTIVE: Physical Exam: Filed Vitals:   05/10/15 0200 05/10/15 0248 05/10/15 0252 05/10/15 0451  BP:    142/52  Pulse: 29 29 30 30   Temp:    98.1 F (36.7 C)  TempSrc:    Oral  Resp: 14 22 19 18   Height:      Weight:    179 lb 3.7 oz (81.3 kg)  SpO2: 100% 100% 100% 100%    Intake/Output Summary (Last 24 hours) at 05/10/15 0818 Last data filed at 05/10/15 [id number]  Gross per 24 hour  Intake      0 ml  Output    200 ml  Net   -200 ml    Telemetry reveals sinus rhythm with complete heart block  GEN- The patient is obese, elderly, chronically ill appearing, alert and oriented x 3 today.   Head- normocephalic, atraumatic Eyes-  Sclera clear, conjunctiva pink Ears- hearing intact Oropharynx- clear Neck- supple, no JVP Lymph- no cervical lymphadenopathy Lungs- Clear to ausculation bilaterally, normal work of breathing Heart- bradycardic regular rate and rhythm, 2/6 murmur GI- soft, NT, ND, + BS Extremities- no clubbing, cyanosis, or edema Skin- no rash or lesion Psych- euthymic mood, full affect Neuro- strength and sensation are intact  LABS: Basic Metabolic Panel:  Recent Labs  96/03/5404/09/16 0901 05/10/15 0321  NA 138 140  K 4.0 4.4  CL 107 106  CO2 19* 18*  GLUCOSE 165* 113*  BUN 111* 117*  CREATININE 5.71*  5.78*  CALCIUM 9.0 9.1   Liver Function Tests:  Recent Labs  05/08/15 1650  AST 83*  ALT 84*  ALKPHOS 67  BILITOT 0.6  PROT 6.6  ALBUMIN 3.6   CBC:  Recent Labs  05/08/15 1314  05/08/15 1650 05/09/15 0901  WBC 9.6  --  9.3 9.2  NEUTROABS 6.7  --   --   --   HGB 10.3*  < > 10.2* 9.2*  HCT 31.8*  < > 30.9* 29.1*  MCV 92.7  --  90.9 92.4  PLT 157  --  151 144*  < > = values in this interval not displayed. Cardiac Enzymes:  Recent Labs  05/08/15 1650 05/08/15 2140 05/09/15 0341  TROPONINI 0.53* 0.48* 0.54*   Hemoglobin A1C:  Recent Labs  05/08/15 1650  HGBA1C 5.8*   Thyroid Function Tests:  Recent Labs  05/08/15 1650  TSH 2.772    RADIOLOGY: Dg Chest Port 1 View 05/08/2015   CLINICAL DATA:  AxO x4. Pt. Complaint of weakness x 2 days, dizziness starting today. Family reports that pt. Had syncopal episode this AM when going to the bathroom. HR 29. Hx CHF/DMH/o HTN, asthma, CHF, diabetes, cardiac cath  in 1995  EXAM: PORTABLE CHEST - 1 VIEW  COMPARISON:  08/04/2014  FINDINGS: External leads overlie the patient. Stable cardiomegaly. Relatively low lung volumes with perihilar and bibasilar bronchovascular crowding and prominence, can't exclude central pulmonary vascular congestion. No confluent airspace consolidation. No effusion. Visualized skeletal structures are unremarkable. Atheromatous aorta.  IMPRESSION: 1. Stable cardiomegaly.  Low lung volumes.   Electronically Signed   By: Corlis Leak  Hassell M.D.   On: 05/08/2015 13:22    ASSESSMENT AND PLAN:  Principal Problem:   Complete heart block Active Problems:   Hyperlipidemia   Hypertensive heart disease   CAD (coronary artery disease), native coronary artery   PVD (peripheral vascular disease)   Chronic combined systolic and diastolic heart failure   Type 2 diabetes mellitus with renal manifestations   Acute renal failure superimposed on stage 4 chronic kidney disease   Anemia due to pre-end-stage renal disease  treated with erythropoietin   Elevated troponin   Pre-syncope  1.  Complete heart block The patient has had persistent heart block despite adequate Coreg washout.  She has had prior conduction system disease with 1st degree AV block, RBBB, LAFB.  With advanced age and renal failure, she is at increased risk for complications from pacemaker and overall long-term mortality is increased.  With significant co-morbidities, would not recommend CRT implantation.  Dr Johney FrameAllred to discuss with patient today.  2.  ESRD She has previously (and currently) refused dialysis Creat increased from baseline, nephrology has seen this admission and feels renal function will improve with AV synchrony  3.  HTN Stable No change required today  4.  Chronic systolic heart failure EF 40-45% this admission No ACE-I/ARB with renal failure No BB currently right now with heart block  [name], NP 05/10/2015 8:26 AM  I have seen, examined the patient, and reviewed the above assessment and plan.  Changes to above are made where necessary.  The patient has symptomatic complete heart block.   No reversible causes are found.  She is very ill and her prognosis is poor.  She has declined dialysis.  I have offered a palliative option which she declines.  She would prefer to "press on" and would like to consider PPM implantation though she recognizes that she is at very high risks of complications with this procedure given her advanced age and comorbidities.  Given her fragility, I do not anticipate that she would receive long term benefit from CRT and think that an additional lead may carry additional risks to her.  I would therefore recommend pacemaker implantation at this time.  Risks, benefits, alternatives to pacemaker implantation were discussed in detail with the patient today. The patient understands that the risks include but are not limited to bleeding, infection, pneumothorax, perforation, tamponade, vascular damage,  renal failure, MI, stroke, death,  and lead dislodgement and wishes to proceed at the next available time.   Co Sign: Hillis RangeJames Abella Shugart, MD 05/10/2015 2:32 PM

## 2015-05-11 ENCOUNTER — Encounter (HOSPITAL_COMMUNITY): Payer: Self-pay | Admitting: Internal Medicine

## 2015-05-11 ENCOUNTER — Ambulatory Visit: Payer: Medicare Other | Admitting: Podiatry

## 2015-05-11 ENCOUNTER — Inpatient Hospital Stay (HOSPITAL_COMMUNITY): Payer: Medicare Other

## 2015-05-11 LAB — GLUCOSE, CAPILLARY
GLUCOSE-CAPILLARY: 147 mg/dL — AB (ref 70–99)
GLUCOSE-CAPILLARY: 184 mg/dL — AB (ref 70–99)
Glucose-Capillary: 136 mg/dL — ABNORMAL HIGH (ref 70–99)
Glucose-Capillary: 100 mg/dL — ABNORMAL HIGH (ref 70–99)

## 2015-05-11 LAB — BASIC METABOLIC PANEL
Anion gap: 14 (ref 5–15)
BUN: 110 mg/dL — ABNORMAL HIGH (ref 6–20)
CALCIUM: 9.2 mg/dL (ref 8.9–10.3)
CO2: 20 mmol/L — AB (ref 22–32)
Chloride: 106 mmol/L (ref 101–111)
Creatinine, Ser: 5.08 mg/dL — ABNORMAL HIGH (ref 0.44–1.00)
GFR calc Af Amer: 8 mL/min — ABNORMAL LOW (ref >60)
GFR, EST NON AFRICAN AMERICAN: 7 mL/min — AB (ref >60)
GLUCOSE: 159 mg/dL — AB (ref 70–99)
Potassium: 4.3 mmol/L (ref 3.5–5.1)
SODIUM: 140 mmol/L (ref 135–145)

## 2015-05-11 MED ORDER — FUROSEMIDE 10 MG/ML IJ SOLN
80.0000 mg | Freq: Once | INTRAMUSCULAR | Status: AC
  Administered 2015-05-11: 80 mg via INTRAVENOUS
  Filled 2015-05-11: qty 8

## 2015-05-11 MED FILL — Heparin Sodium (Porcine) 2 Unit/ML in Sodium Chloride 0.9%: INTRAMUSCULAR | Qty: 500 | Status: AC

## 2015-05-11 MED FILL — Lidocaine HCl Local Preservative Free (PF) Inj 1%: INTRAMUSCULAR | Qty: 30 | Status: AC

## 2015-05-11 NOTE — Progress Notes (Signed)
SUBJECTIVE: The patient is having increased shortness of breath this morning.  No chest pain.    CURRENT MEDICATIONS: . aspirin EC  81 mg Oral Daily  . atorvastatin  40 mg Oral q morning - 10a  . carvedilol  12.5 mg Oral BID  .  ceFAZolin (ANCEF) IV  1 g Intravenous Once  . docusate sodium  100 mg Oral BID  . insulin aspart  0-15 Units Subcutaneous TID WC  . isosorbide mononitrate  30 mg Oral Daily  . linagliptin  5 mg Oral Daily  . paricalcitol  1 mcg Oral Daily  . sodium chloride  3 mL Intravenous Q12H      OBJECTIVE: Physical Exam: Filed Vitals:   05/11/15 0015 05/11/15 0400 05/11/15 0455 05/11/15 0731  BP:  178/61    Pulse:  64    Temp: 98.9 F (37.2 C)  99.1 F (37.3 C) 98.8 F (37.1 C)  TempSrc: Oral  Oral Oral  Resp:  24    Height:      Weight:   177 lb (80.287 kg)   SpO2:  100%      Intake/Output Summary (Last 24 hours) at 05/11/15 0758 Last data filed at 05/11/15 0650  Gross per 24 hour  Intake    360 ml  Output    150 ml  Net    210 ml    Telemetry reveals sinus rhythm with ventricular pacing  GEN- The patient is obese, elderly, chronically ill appearing, alert and oriented x 3 today.   Head- normocephalic, atraumatic Eyes-  Sclera clear, conjunctiva pink Ears- hearing intact Oropharynx- clear Neck- supple  Lymph- no cervical lymphadenopathy Lungs- Increased work of breathing, scattered expiratory wheezing Heart- Regular rate and rhythm (paced) GI- soft, NT, ND, + BS Extremities- no clubbing, cyanosis  Skin- no rash or lesion, left chest without hematoma/ecchymosis Psych- euthymic mood, full affect Neuro- strength and sensation are intact  LABS: Basic Metabolic Panel:  Recent Labs  16/09/9604/10/16 0321 05/11/15 0320  NA 140 140  K 4.4 4.3  CL 106 106  CO2 18* 20*  GLUCOSE 113* 159*  BUN 117* 110*  CREATININE 5.78* 5.08*  CALCIUM 9.1 9.2   Liver Function Tests:  Recent Labs  05/08/15 1650  AST 83*  ALT 84*  ALKPHOS 67  BILITOT  0.6  PROT 6.6  ALBUMIN 3.6   CBC:  Recent Labs  05/08/15 1314  05/08/15 1650 05/09/15 0901  WBC 9.6  --  9.3 9.2  NEUTROABS 6.7  --   --   --   HGB 10.3*  < > 10.2* 9.2*  HCT 31.8*  < > 30.9* 29.1*  MCV 92.7  --  90.9 92.4  PLT 157  --  151 144*  < > = values in this interval not displayed. Cardiac Enzymes:  Recent Labs  05/08/15 1650 05/08/15 2140 05/09/15 0341  TROPONINI 0.53* 0.48* 0.54*   Hemoglobin A1C:  Recent Labs  05/08/15 1650  HGBA1C 5.8*   Thyroid Function Tests:  Recent Labs  05/08/15 1650  TSH 2.772    RADIOLOGY: Dg Chest Port 1 View 05/08/2015   CLINICAL DATA:  AxO x4. Pt. Complaint of weakness x 2 days, dizziness starting today. Family reports that pt. Had syncopal episode this AM when going to the bathroom. HR 29. Hx CHF/DMH/o HTN, asthma, CHF, diabetes, cardiac cath in 1995  EXAM: PORTABLE CHEST - 1 VIEW  COMPARISON:  08/04/2014  FINDINGS: External leads overlie the patient. Stable cardiomegaly. Relatively low  lung volumes with perihilar and bibasilar bronchovascular crowding and prominence, can't exclude central pulmonary vascular congestion. No confluent airspace consolidation. No effusion. Visualized skeletal structures are unremarkable. Atheromatous aorta.  IMPRESSION: 1. Stable cardiomegaly.  Low lung volumes.   Electronically Signed   By: Corlis Leak  Hassell M.D.   On: 05/08/2015 13:22    ASSESSMENT AND PLAN:  Principal Problem:   Complete heart block Active Problems:   Hyperlipidemia   Hypertensive heart disease   CAD (coronary artery disease), native coronary artery   PVD (peripheral vascular disease)   Chronic combined systolic and diastolic heart failure   Type 2 diabetes mellitus with renal manifestations   Acute renal failure superimposed on stage 4 chronic kidney disease   Anemia due to pre-end-stage renal disease treated with erythropoietin   Elevated troponin   Pre-syncope  1.  Complete heart block S/p PPM implant 05/10/15 Device  interrogation this morning with slightly elevated RV threshold but otherwise normal CXR with leads in stable position, bilateral pulmonary interstitial edema and pleural effusions  2.  Acute on chronic systolic and diastolic heart failure Received 40mg  IV Lasix last night Will give 80mg  IV Lasix this morning Creatinine slightly improved post pacing Will ask nephrology to see again to help manage diuresis Strict I/O Will resume Coreg   3.  Acute on chronic renal failure As above She has previously and currently still refuses dialysis   [name], NP 05/11/2015 8:10 AM  I have seen, examined the patient, and reviewed the above assessment and plan.  She is dypsneic on exam.  [facility] suggesting volume overload.  Will need to diurese.  Will ask nephrology to reassess.  Changes to above are made where necessary.    Co Sign: Hillis RangeJames Lovie Zarling, MD 05/11/2015 8:36 AM

## 2015-05-11 NOTE — Progress Notes (Signed)
Pt feeling better currently, feeling stronger.  No problems with breathing, lung currently clear.

## 2015-05-11 NOTE — Progress Notes (Signed)
Medicare Important Message given? YES   (If response is "NO", the following Medicare IM given date fields will be blank)   Date Medicare IM given:   Medicare IM given by: Graves-Bigelow, Yosselin Zoeller  

## 2015-05-12 ENCOUNTER — Other Ambulatory Visit: Payer: Self-pay | Admitting: Nurse Practitioner

## 2015-05-12 ENCOUNTER — Inpatient Hospital Stay (HOSPITAL_COMMUNITY): Payer: Medicare Other

## 2015-05-12 DIAGNOSIS — N179 Acute kidney failure, unspecified: Secondary | ICD-10-CM | POA: Insufficient documentation

## 2015-05-12 DIAGNOSIS — N189 Chronic kidney disease, unspecified: Secondary | ICD-10-CM

## 2015-05-12 DIAGNOSIS — I5043 Acute on chronic combined systolic (congestive) and diastolic (congestive) heart failure: Secondary | ICD-10-CM

## 2015-05-12 DIAGNOSIS — E669 Obesity, unspecified: Secondary | ICD-10-CM

## 2015-05-12 DIAGNOSIS — Z950 Presence of cardiac pacemaker: Secondary | ICD-10-CM | POA: Insufficient documentation

## 2015-05-12 LAB — GLUCOSE, CAPILLARY
GLUCOSE-CAPILLARY: 126 mg/dL — AB (ref 65–99)
GLUCOSE-CAPILLARY: 106 mg/dL — AB (ref 65–99)
Glucose-Capillary: 108 mg/dL — ABNORMAL HIGH (ref 65–99)
Glucose-Capillary: 66 mg/dL (ref 65–99)
Glucose-Capillary: 117 mg/dL — ABNORMAL HIGH (ref 65–99)

## 2015-05-12 LAB — BLOOD GAS, ARTERIAL
ACID-BASE DEFICIT: 1.1 mmol/L (ref 0.0–2.0)
Bicarbonate: 24.4 mEq/L — ABNORMAL HIGH (ref 20.0–24.0)
Drawn by: 331001
O2 Content: 1 L/min
O2 SAT: 97.5 %
PATIENT TEMPERATURE: 97.9
TCO2: 26 mmol/L (ref 0–100)
pCO2 arterial: 50.2 mmHg — ABNORMAL HIGH (ref 35.0–45.0)
pH, Arterial: 7.306 — ABNORMAL LOW (ref 7.350–7.450)
pO2, Arterial: 84.5 mmHg (ref 80.0–100.0)

## 2015-05-12 LAB — BASIC METABOLIC PANEL
ANION GAP: 13 (ref 5–15)
BUN: 107 mg/dL — ABNORMAL HIGH (ref 6–20)
CALCIUM: 9 mg/dL (ref 8.9–10.3)
CO2: 23 mmol/L (ref 22–32)
Chloride: 104 mmol/L (ref 101–111)
Creatinine, Ser: 4.67 mg/dL — ABNORMAL HIGH (ref 0.44–1.00)
GFR calc Af Amer: 9 mL/min — ABNORMAL LOW (ref >60)
GFR calc non Af Amer: 8 mL/min — ABNORMAL LOW (ref >60)
Glucose, Bld: 72 mg/dL (ref 65–99)
Potassium: 3.8 mmol/L (ref 3.5–5.1)
SODIUM: 140 mmol/L (ref 135–145)

## 2015-05-12 LAB — IRON AND TIBC
Iron: 20 ug/dL — ABNORMAL LOW (ref 28–170)
SATURATION RATIOS: 10 % — AB (ref 10.4–31.8)
TIBC: 203 ug/dL — AB (ref 250–450)
UIBC: 183 ug/dL

## 2015-05-12 LAB — FERRITIN: Ferritin: 364 ng/mL — ABNORMAL HIGH (ref 11–307)

## 2015-05-12 MED ORDER — FUROSEMIDE 10 MG/ML IJ SOLN
80.0000 mg | Freq: Once | INTRAMUSCULAR | Status: AC
  Administered 2015-05-12: 80 mg via INTRAVENOUS
  Filled 2015-05-12: qty 8

## 2015-05-12 MED ORDER — FUROSEMIDE 40 MG PO TABS
40.0000 mg | ORAL_TABLET | Freq: Every day | ORAL | Status: DC
  Administered 2015-05-14 – 2015-05-15 (×2): 40 mg via ORAL
  Filled 2015-05-12 (×2): qty 1

## 2015-05-12 MED ORDER — FUROSEMIDE 10 MG/ML IJ SOLN
40.0000 mg | Freq: Two times a day (BID) | INTRAMUSCULAR | Status: AC
  Administered 2015-05-12 – 2015-05-13 (×3): 40 mg via INTRAVENOUS
  Filled 2015-05-12 (×3): qty 4

## 2015-05-12 NOTE — Progress Notes (Signed)
Called to see patient for persistent intermittent loss of ventricular capture since reprogramming device unipolar.  Device interrogated with intermittent capture at 3.5V.   Device reprogrammed to 7.5V @ 1.545msec.   Will reassess later today.  Hopefully can avoid lead revision with significant co-morbidities.   CXR reviewed and demonstrates relatively stable lead placement.   [name], NP 05/12/2015 12:28 PM  Hillis RangeJames Megahn Killings MD, [facility] 05/12/2015 12:39 PM

## 2015-05-12 NOTE — Progress Notes (Signed)
Patient ID: Dorothy FraiseCorine H Roepke, female   DOB: 1926/01/27, 81 y.o.   MRN: [id number]   [facility] Progress Note    Assessment/ Plan:   1. Acute renal failure on chronic kidney disease stage 5: This appears to have been hemodynamically mediated with renal hypoperfusion as a result of bradycardia. Renal function now appears to be improving status post pacemaker/improved renal perfusion (although there appears to be some concern regarding lead placement). Clinically, she is moderately hypervolemic and with significant improvement of her shortness of breath-I recommend furosemide 40 mg intravenously twice a day for another 24-48 hours and then restarting her outpatient dose of furosemide 40 mg daily thereafter with daily lab monitoring. She again affirms her decision not to pursue any forms of renal replacement therapy. Electrolytes are within acceptable limits and her recent increased somnolence may be bifactorial from sedation for PPM placement versus elevated BUN/uremia. 2. Complete heart block/bradycardia/presyncope: Underwent successful placement of pacemaker 2 days ago-currently undergoing calibration/reevaluation for possible lead revision 3. Hypertension: Blood pressures elevated, she is on carvedilol and Imdur and anticipate some improvement with optimization of diuretic therapy. Previously, she was on hydralazine 10 mg 3 times a day that may be restarted. 4. Metabolic bone disease: On Zemplar for PTH suppression, continue to monitor calcium/phosphorus 5. Anemia: We'll check iron studies and supplement if deficient, consider ESA  Subjective:   Reports to be feeling well with improved shortness of breath overnight. Daughter at bedside reports that she is is still fatigued/somnolent and physical therapy today was canceled    Objective:   BP 151/61 mmHg  Pulse 57  Temp(Src) 97.9 F (36.6 C) (Oral)  Resp 21  Ht 4\' 11"  (1.499 m)  Wt 81.557 kg (179 lb 12.8 oz)  BMI 36.30 kg/m2  SpO2  100%  Intake/Output Summary (Last 24 hours) at 05/12/15 1326 Last data filed at 05/12/15 [id number]  Gross per 24 hour  Intake    600 ml  Output    300 ml  Net    300 ml   Weight change: 1.27 kg (2 lb 12.8 oz)  Physical Exam: Gen: Comfortably resting in bed, easy to awaken and engage in appropriate conversation CVS: Pulse regular bradycardia, S1 and S2 normal Resp: Clear to auscultation, no distinct rales/rhonchi Abd: Soft, obese, nontender Ext: Trace lower extremity edema  Imaging: Dg Chest 2 View  05/11/2015   CLINICAL DATA:  Shortness of breath.  EXAM: CHEST  2 VIEW  COMPARISON:  05/08/2015.  FINDINGS: Cardiomegaly with pulmonary venous congestion and new onset bilateral pulmonary interstitial prominence consistent with congestive heart failure. Tiny bilateral pleural effusions noted. Cardiac pacer noted with lead tips in right atrium and right ventricle. No pneumothorax.  IMPRESSION: Congestive heart failure with bilateral pulmonary interstitial edema and pleural effusions.   Electronically Signed   By: Maisie Fushomas  Register   On: 05/11/2015 08:05   Dg Chest Port 1 View  05/12/2015   CLINICAL DATA:  Pacemaker  EXAM: PORTABLE CHEST - 1 VIEW  COMPARISON:  05/11/2015  FINDINGS: Dual lead pacemaker in good position and unchanged  Cardiac enlargement. Progression of congestive heart failure with increased vascular congestion and edema. Enlarging right pleural effusion. Increased atelectasis in the lung bases.  IMPRESSION: Progression of congestive heart failure with edema. Enlarging right effusion and enlarging bibasilar atelectasis.   Electronically Signed   By: Marlan Palauharles  Clark M.D.   On: 05/12/2015 10:23    Labs: BMET  Recent Labs Lab 05/08/15 1314 05/08/15 1320 05/08/15 1650 05/09/15 0901 05/10/15 0321  05/11/15 0320 05/12/15 0343  NA 137 138 137 138 140 140 140  K 4.4 4.5 4.4 4.0 4.4 4.3 3.8  CL 104 106 104 107 106 106 104  CO2 20*  --  17* 19* 18* 20* 23  GLUCOSE 195* 190* 144* 165*  113* 159* 72  BUN 106* 109* 108* 111* 117* 110* 107*  CREATININE 5.44* 5.40* 5.62* 5.71* 5.78* 5.08* 4.67*  CALCIUM 9.3  --  9.0 9.0 9.1 9.2 9.0   CBC  Recent Labs Lab 05/08/15 1314 05/08/15 1320 05/08/15 1650 05/09/15 0901  WBC 9.6  --  9.3 9.2  NEUTROABS 6.7  --   --   --   HGB 10.3* 11.6* 10.2* 9.2*  HCT 31.8* 34.0* 30.9* 29.1*  MCV 92.7  --  90.9 92.4  PLT 157  --  151 144*    Medications:    . aspirin EC  81 mg Oral Daily  . atorvastatin  40 mg Oral q morning - 10a  . carvedilol  12.5 mg Oral BID  . docusate sodium  100 mg Oral BID  . insulin aspart  0-15 Units Subcutaneous TID WC  . isosorbide mononitrate  30 mg Oral Daily  . linagliptin  5 mg Oral Daily  . paricalcitol  1 mcg Oral Daily  . sodium chloride  3 mL Intravenous Q12H      Zetta BillsJay Nicole Defino, MD 05/12/2015, 1:26 PM

## 2015-05-12 NOTE — Progress Notes (Signed)
PT Cancellation Note  Patient Details Name: Dorothy Bartlett MRN: [id number] DOB: [birth year]   Cancelled Treatment:    Reason Eval/Treat Not Completed: Medical issues which prohibited therapy; RN reports MD wants her to rest as working on pacemaker today. Will attempt tomorrow.   WYNN,CYNDI 05/12/2015, 12:58 PM  Sheran Lawlessyndi Wynn, PT [phone] 05/12/2015

## 2015-05-12 NOTE — Progress Notes (Signed)
SUBJECTIVE: Shortness of breath is improved this morning.  No chest pain.   CURRENT MEDICATIONS: . aspirin EC  81 mg Oral Daily  . atorvastatin  40 mg Oral q morning - 10a  . carvedilol  12.5 mg Oral BID  . docusate sodium  100 mg Oral BID  . insulin aspart  0-15 Units Subcutaneous TID WC  . isosorbide mononitrate  30 mg Oral Daily  . linagliptin  5 mg Oral Daily  . paricalcitol  1 mcg Oral Daily  . sodium chloride  3 mL Intravenous Q12H      OBJECTIVE: Physical Exam: Filed Vitals:   05/11/15 1700 05/11/15 2048 05/11/15 2348 05/12/15 0319  BP:  151/61    Pulse:  57    Temp: 98.6 F (37 C) 99 F (37.2 C) 98.9 F (37.2 C) 98.5 F (36.9 C)  TempSrc: Oral Oral Oral Oral  Resp:  21    Height:      Weight:    179 lb 12.8 oz (81.557 kg)  SpO2:  100%      Intake/Output Summary (Last 24 hours) at 05/12/15 [id number] Last data filed at 05/11/15 2230  Gross per 24 hour  Intake    600 ml  Output    950 ml  Net   -350 ml    Telemetry reveals sinus rhythm with ventricular pacing and intermittent loss of ventricular capture  GEN- The patient is obese, elderly, chronically ill appearing, alert and oriented x 3 today.   Head- normocephalic, atraumatic Eyes-  Sclera clear, conjunctiva pink Ears- hearing intact Oropharynx- clear Neck- supple  Lymph- no cervical lymphadenopathy Lungs- Lungs clear to auscultation bilaterally, normal work of breathing Heart- Regular rate and rhythm (paced) GI- soft, NT, ND, + BS Extremities- no clubbing, cyanosis  Skin- no rash or lesion, left chest without hematoma/ecchymosis Psych- euthymic mood, full affect Neuro- strength and sensation are intact  LABS: Basic Metabolic Panel:  Recent Labs  96/03/5404/11/16 0320 05/12/15 0343  NA 140 140  K 4.3 3.8  CL 106 104  CO2 20* 23  GLUCOSE 159* 72  BUN 110* 107*  CREATININE 5.08* 4.67*  CALCIUM 9.2 9.0   CBC:  Recent Labs  05/09/15 0901  WBC 9.2  HGB 9.2*  HCT 29.1*  MCV 92.4  PLT 144*     RADIOLOGY: Dg Chest Port 1 View 05/08/2015   CLINICAL DATA:  AxO x4. Pt. Complaint of weakness x 2 days, dizziness starting today. Family reports that pt. Had syncopal episode this AM when going to the bathroom. HR 29. Hx CHF/DMH/o HTN, asthma, CHF, diabetes, cardiac cath in 1995  EXAM: PORTABLE CHEST - 1 VIEW  COMPARISON:  08/04/2014  FINDINGS: External leads overlie the patient. Stable cardiomegaly. Relatively low lung volumes with perihilar and bibasilar bronchovascular crowding and prominence, can't exclude central pulmonary vascular congestion. No confluent airspace consolidation. No effusion. Visualized skeletal structures are unremarkable. Atheromatous aorta.  IMPRESSION: 1. Stable cardiomegaly.  Low lung volumes.   Electronically Signed   By: Corlis Leak  Hassell M.D.   On: 05/08/2015 13:22    ASSESSMENT AND PLAN:  Principal Problem:   Complete heart block Active Problems:   Hyperlipidemia   Hypertensive heart disease   CAD (coronary artery disease), native coronary artery   PVD (peripheral vascular disease)   Chronic combined systolic and diastolic heart failure   Type 2 diabetes mellitus with renal manifestations   Acute renal failure superimposed on stage 4 chronic kidney disease   Anemia due to  pre-end-stage renal disease treated with erythropoietin   Elevated troponin   Pre-syncope  1.  Complete heart block S/p PPM implant 05/10/15 Intermittent loss of ventricular capture on telemetry Device interrogation this morning demonstrated loss of bipolar ventricular capture at 5V.  She has consistent unipolar capture at 0.75V.  Unipolar output fixed at 7.5V this morning until xray done.  Portable chest xray this morning  2.  Acute on chronic systolic and diastolic heart failure Shortness of breath improved with diuresis Will give IV Lasix again today Will ask nephrology to see to help manage diuresis - creat improved post pacing Strict I/O  3.  Acute on chronic renal failure As  above She has previously and currently still refuses dialysis   [name], NP 05/12/2015 7:33 AM  I have seen, examined the patient, and reviewed the above assessment and plan.  Changes to above are made where necessary.  Intermittent loss of capture noted.  Interrogation this am reveals bipolar intermittent loss of capture.  Unipolar threshold is very good.  I will order chest Xray.  If lead is in same position, will follow conservatively with unipolar pacing.  Improving with IV lasix.  I would like for nephrology to come back by.  Co Sign: Hillis RangeJames Shanavia Makela, MD 05/12/2015 7:58 AM

## 2015-05-13 ENCOUNTER — Encounter (HOSPITAL_COMMUNITY)
Admission: EM | Disposition: A | Discharge status: Skilled Nursing Facility | Payer: Self-pay | Source: Home / Self Care | Attending: Interventional Cardiology

## 2015-05-13 LAB — RENAL FUNCTION PANEL
ALBUMIN: 3.1 g/dL — AB (ref 3.5–5.0)
ANION GAP: 14 (ref 5–15)
BUN: 107 mg/dL — ABNORMAL HIGH (ref 6–20)
CHLORIDE: 103 mmol/L (ref 101–111)
CO2: 23 mmol/L (ref 22–32)
CREATININE: 4.49 mg/dL — AB (ref 0.44–1.00)
Calcium: 8.9 mg/dL (ref 8.9–10.3)
GFR calc non Af Amer: 8 mL/min — ABNORMAL LOW (ref >60)
GFR, EST AFRICAN AMERICAN: 9 mL/min — AB (ref >60)
GLUCOSE: 86 mg/dL (ref 65–99)
PHOSPHORUS: 6.2 mg/dL — AB (ref 2.5–4.6)
POTASSIUM: 3.9 mmol/L (ref 3.5–5.1)
Sodium: 140 mmol/L (ref 135–145)

## 2015-05-13 LAB — GLUCOSE, CAPILLARY
GLUCOSE-CAPILLARY: 93 mg/dL (ref 65–99)
GLUCOSE-CAPILLARY: 133 mg/dL — AB (ref 65–99)
Glucose-Capillary: 124 mg/dL — ABNORMAL HIGH (ref 65–99)
Glucose-Capillary: 137 mg/dL — ABNORMAL HIGH (ref 65–99)

## 2015-05-13 SURGERY — LEAD REVISION/REPAIR

## 2015-05-13 NOTE — Progress Notes (Signed)
Subjective:  "I feel fine" had 1300 of UOP last 24 hours Objective Vital signs in last 24 hours: Filed Vitals:   05/12/15 2011 05/13/15 0001 05/13/15 0005 05/13/15 0516  BP:   143/55   Pulse:   62   Temp: 98.4 F (36.9 C) 98.6 F (37 C)  98.6 F (37 C)  TempSrc: Oral Oral  Oral  Resp:   16   Height:      Weight:    86.637 kg (191 lb)  SpO2:   100%    Weight change: 5.08 kg (11 lb 3.2 oz)  Intake/Output Summary (Last 24 hours) at 05/13/15 1113 Last data filed at 05/13/15 0500  Gross per 24 hour  Intake      0 ml  Output   1300 ml  Net  -1300 ml    Assessment/ Plan: Pt is a 81 y.o. yo female who was admitted on 05/08/2015 with syncopal episode associated with heart block s/p PPM Assessment/Plan: 1. Renal- pt not interested in renal replacement therapy.  If anything renal function is improving slowly 2. Volume- recalled yesterday for difficulty with diuresis- plan in place to continue IV lasix then on Saturday resume op home dose of lasix on Saturday- status is improving- 100 % sat on 1 liter  3. Anemia- i see mentoin of ESA- dont think it will significantly impact QOL- would not do   Renal will sign off- diuretics ordered with intent to go to home dose on Saturday- call with further questions   Ariannie Penaloza A    Labs: Basic Metabolic Panel:  Recent Labs Lab 05/11/15 0320 05/12/15 0343 05/13/15 0253  NA 140 140 140  K 4.3 3.8 3.9  CL 106 104 103  CO2 20* 23 23  GLUCOSE 159* 72 86  BUN 110* 107* 107*  CREATININE 5.08* 4.67* 4.49*  CALCIUM 9.2 9.0 8.9  PHOS  --   --  6.2*   Liver Function Tests:  Recent Labs Lab 05/08/15 1650 05/13/15 0253  AST 83*  --   ALT 84*  --   ALKPHOS 67  --   BILITOT 0.6  --   PROT 6.6  --   ALBUMIN 3.6 3.1*   No results for input(s): LIPASE, AMYLASE in the last 168 hours. No results for input(s): AMMONIA in the last 168 hours. CBC:  Recent Labs Lab 05/08/15 1314 05/08/15 1320 05/08/15 1650 05/09/15 0901  WBC  9.6  --  9.3 9.2  NEUTROABS 6.7  --   --   --   HGB 10.3* 11.6* 10.2* 9.2*  HCT 31.8* 34.0* 30.9* 29.1*  MCV 92.7  --  90.9 92.4  PLT 157  --  151 144*   Cardiac Enzymes:  Recent Labs Lab 05/08/15 1314 05/08/15 1650 05/08/15 2140 05/09/15 0341  TROPONINI 0.64* 0.53* 0.48* 0.54*   CBG:  Recent Labs Lab 05/12/15 0818 05/12/15 1131 05/12/15 1634 05/12/15 2124 05/13/15 0733  GLUCAP 108* 126* 106* 117* 93    Iron Studies:  Recent Labs  05/12/15 1438  IRON 20*  TIBC 203*  FERRITIN 364*   Studies/Results: Dg Chest Port 1 View  05/12/2015   CLINICAL DATA:  Pacemaker  EXAM: PORTABLE CHEST - 1 VIEW  COMPARISON:  05/11/2015  FINDINGS: Dual lead pacemaker in good position and unchanged  Cardiac enlargement. Progression of congestive heart failure with increased vascular congestion and edema. Enlarging right pleural effusion. Increased atelectasis in the lung bases.  IMPRESSION: Progression of congestive heart failure with edema. Enlarging right effusion  and enlarging bibasilar atelectasis.   Electronically Signed   By: Marlan Palauharles  Clark M.D.   On: 05/12/2015 10:23   Medications: Infusions:    Scheduled Medications: . aspirin EC  81 mg Oral Daily  . atorvastatin  40 mg Oral q morning - 10a  . carvedilol  12.5 mg Oral BID  . docusate sodium  100 mg Oral BID  . furosemide  40 mg Intravenous BID   Followed by  . [START ON 05/14/2015] furosemide  40 mg Oral Daily  . insulin aspart  0-15 Units Subcutaneous TID WC  . isosorbide mononitrate  30 mg Oral Daily  . linagliptin  5 mg Oral Daily  . paricalcitol  1 mcg Oral Daily  . sodium chloride  3 mL Intravenous Q12H    have reviewed scheduled and prn medications.  Physical Exam: General: NAD Heart: RRR- rate in the 60's Lungs: decreased BS at bases Abdomen: soft, non tender Extremities: trace to 1plus edema    05/13/2015,11:13 AM  LOS: 5 days

## 2015-05-13 NOTE — Progress Notes (Signed)
Pt with consistent ventricular capture since this morning with activity getting out of the bed. Will avoid lead revision for now and watch in the hospital another 24 hours. Will ask PT to assess for ability to patient to return home independently.    [name], NP 05/13/2015 1:03 PM  Hillis RangeJames Lecretia Buczek MD

## 2015-05-13 NOTE — Clinical Social Work Note (Signed)
Clinical Social Work Assessment  Patient Details  Name: Dorothy Bartlett MRN: [id number] Date of Birth: [birth year]  Date of referral:  05/13/15               Reason for consult:  Facility Placement, Discharge Planning                Permission sought to share information with:  Facility Medical sales representativeContact Representative, Family Supports Permission granted to share information::  Yes, Verbal Permission Granted  Name::     Landis GandyNancy Bartlett  Agency::     Relationship::  Daughter  Contact Information:  [phone]  Housing/Transportation Living arrangements for the past 2 months:  Single Family Home Source of Information:  Patient, Adult Children Patient Interpreter Needed:  None Criminal Activity/Legal Involvement Pertinent to Current Situation/Hospitalization:  No - Comment as needed Significant Relationships:  Adult Children Lives with:  Self Do you feel safe going back to the place where you live?  Yes Need for family participation in patient care:  No (Coment) (Pt requesting)  Care giving concerns:  PT recommending SNF   Social Worker assessment / plan:  CSW visited pt room to discuss above recommendation. Pt unaware of recommendation and not sure it is necessary. Pt explained to CSW [facility]. She is agreeable to dc to SNF if needed, but once again, but stated she doesn't think it will be necessary. Pt feels as though she is not too far from baseline and will have support from her family at discharge. Pt is agreeable to [facility] incase SNF is the dc plan. Pt requesting someone update her family when possible.  CSW called pt daughter and notified of recommendation. She also stated that they are working on arranging care for pt at home. She states that family is only stuck on working out care at night but do not feel it will be an issue. Pt daughter would like CSW to send out referral so that family can be aware of what options are available  before making decision. She is aware of potential for weekend dc.    Employment status:  Retired Database administratornsurance information:  Managed Medicare PT Recommendations:  Skilled Nursing Facility Information / Referral to community resources:  Skilled Nursing Facility  Patient/Family's Response to care:  Pt not in agreement with recommendation, but willing to dc to SNF if this continues to be what is recommended.   Patient/Family's Understanding of and Emotional Response to Diagnosis, Current Treatment, and Prognosis:  Pt with hopeful outlook and feels she is doing better. Pt able to appropriately answer CSW questions and demonstrated basic knowledge of her condition. Pt coping appropriately.  Emotional Assessment Appearance:  Appears stated age, Well-Groomed Attitude/Demeanor/Rapport:  Lethargic Affect (typically observed):  Calm, Happy, Hopeful Orientation:  Oriented to Self, Oriented to Place, Oriented to  Time, Oriented to Situation Alcohol / Substance use:  Not Applicable Psych involvement (Current and /or in the community):  No (Comment)  Discharge Needs  Concerns to be addressed:  Discharge Planning Concerns Readmission within the last 30 days:  No Current discharge risk:  Dependent with Mobility Barriers to Discharge:  Continued Medical Work up   H[employer]R BlockPoonum Rosangelica Pevehouse, [employer]LCSWA [phone]

## 2015-05-13 NOTE — Clinical Social Work Placement (Signed)
   CLINICAL SOCIAL WORK PLACEMENT  NOTE  Date:  05/13/2015  Patient Details  Name: Dorothy Bartlett MRN: [id number] Date of Birth: 1926-10-05  Clinical Social Work is seeking post-discharge placement for this patient at the Skilled  Nursing Facility level of care (*CSW will initial, date and re-position this form in  chart as items are completed):  Yes   Patient/family provided with [facility] Clinical Social Work Department's list of facilities offering this level of care within the geographic area requested by the patient (or if unable, by the patient's family).  Yes   Patient/family informed of their freedom to choose among providers that offer the needed level of care, that participate in Medicare, Medicaid or managed care program needed by the patient, have an available bed and are willing to accept the patient.  Yes   Patient/family informed of [facility]'s ownership interest in [facility] and [facility], as well as of the fact that they are under no obligation to receive care at these facilities.  PASRR submitted to EDS on       PASRR number received on       Existing PASRR number confirmed on 05/13/15     FL2 transmitted to all facilities in geographic area requested by pt/family on 05/13/15     FL2 transmitted to all facilities within larger geographic area on       Patient informed that his/her managed care company has contracts with or will negotiate with certain facilities, including the following:            Patient/family informed of bed offers received.  Patient chooses bed at       Physician recommends and patient chooses bed at      Patient to be transferred to   on  .  Patient to be transferred to facility by       Patient family notified on   of transfer.  Name of family member notified:        PHYSICIAN Please prepare priority discharge summary, including medications, Please sign FL2, Please prepare prescriptions     Additional Comment:     _______________________________________________ Sharol HarnessPoonum Cyndal Kasson, Theresia MajorsLCSWA [phone]

## 2015-05-13 NOTE — Progress Notes (Signed)
SUBJECTIVE: She is more alert this morning. No chest pain or shortness or breath.   CURRENT MEDICATIONS: . aspirin EC  81 mg Oral Daily  . atorvastatin  40 mg Oral q morning - 10a  . carvedilol  12.5 mg Oral BID  . docusate sodium  100 mg Oral BID  . furosemide  40 mg Intravenous BID   Followed by  . [START ON 05/14/2015] furosemide  40 mg Oral Daily  . insulin aspart  0-15 Units Subcutaneous TID WC  . isosorbide mononitrate  30 mg Oral Daily  . linagliptin  5 mg Oral Daily  . paricalcitol  1 mcg Oral Daily  . sodium chloride  3 mL Intravenous Q12H      OBJECTIVE: Physical Exam: Filed Vitals:   05/12/15 2011 05/13/15 0001 05/13/15 0005 05/13/15 0516  BP:   143/55   Pulse:   62   Temp: 98.4 F (36.9 C) 98.6 F (37 C)  98.6 F (37 C)  TempSrc: Oral Oral  Oral  Resp:   16   Height:      Weight:    191 lb (86.637 kg)  SpO2:   100%     Intake/Output Summary (Last 24 hours) at 05/13/15 0835 Last data filed at 05/13/15 0500  Gross per 24 hour  Intake    240 ml  Output   1300 ml  Net  -1060 ml    Telemetry reveals sinus rhythm with ventricular pacing and intermittent loss of ventricular capture (improved)  GEN- The patient is obese, elderly, chronically ill appearing, alert and oriented x 3 today.   Head- normocephalic, atraumatic Eyes-  Sclera clear, conjunctiva pink Ears- hearing intact Oropharynx- clear Neck- supple  Lymph- no cervical lymphadenopathy Lungs- Lungs clear to auscultation bilaterally, normal work of breathing Heart- Regular rate and rhythm (paced) GI- soft, NT, ND, + BS Extremities- no clubbing, cyanosis  Skin- no rash or lesion, left chest without hematoma/ecchymosis Psych- euthymic mood, full affect Neuro- strength and sensation are intact  LABS: Basic Metabolic Panel:  Recent Labs  09/81/1904/12/15 0343 05/13/15 0253  NA 140 140  K 3.8 3.9  CL 104 103  CO2 23 23  GLUCOSE 72 86  BUN 107* 107*  CREATININE 4.67* 4.49*  CALCIUM 9.0 8.9    PHOS  --  6.2*    RADIOLOGY: Dg Chest Port 1 View 05/08/2015   CLINICAL DATA:  AxO x4. Pt. Complaint of weakness x 2 days, dizziness starting today. Family reports that pt. Had syncopal episode this AM when going to the bathroom. HR 29. Hx CHF/DMH/o HTN, asthma, CHF, diabetes, cardiac cath in 1995  EXAM: PORTABLE CHEST - 1 VIEW  COMPARISON:  08/04/2014  FINDINGS: External leads overlie the patient. Stable cardiomegaly. Relatively low lung volumes with perihilar and bibasilar bronchovascular crowding and prominence, can't exclude central pulmonary vascular congestion. No confluent airspace consolidation. No effusion. Visualized skeletal structures are unremarkable. Atheromatous aorta.  IMPRESSION: 1. Stable cardiomegaly.  Low lung volumes.   Electronically Signed   By: Corlis Leak  Hassell M.D.   On: 05/08/2015 13:22    ASSESSMENT AND PLAN:  Principal Problem:   Complete heart block Active Problems:   Hyperlipidemia   Hypertensive heart disease   CAD (coronary artery disease), native coronary artery   PVD (peripheral vascular disease)   Chronic combined systolic and diastolic heart failure   Type 2 diabetes mellitus with renal manifestations   Acute renal failure superimposed on stage 4 chronic kidney disease   Anemia  due to pre-end-stage renal disease treated with erythropoietin   Elevated troponin   Pre-syncope   Acute on chronic renal failure   Pacemaker   Acute on chronic combined systolic and diastolic ACC/AHA stage C congestive heart failure  1.  Complete heart block S/p PPM implant 05/10/15 Intermittent loss of ventricular capture on telemetry, but improved--> likely due to mixed acidosis Will ambulate this morning with PT to assess lead stability with ambulation If stable on telemetry, will not plan to revise lead with significant co-morbidities  2.  Acute on chronic systolic and diastolic heart failure Shortness of breath improved with diuresis Diuresis per nephrology - appreciative of  their assistance Strict I/O  3.  Acute on chronic renal failure Creatinine improving Nephrology following   [name], NP 05/13/2015 8:35 AM   I have seen, examined the patient, and reviewed the above assessment and plan. More alert on exam.  Changes to above are made where necessary.   Will need to begin PT.  She will likely remain in the hospital over the weekend.  General cardiology to see this weekend for diuresis and EP to follow as needed.  Appreciate nephrology assistance.  Co Sign: Hillis RangeJames Vallory Oetken, MD 05/13/2015

## 2015-05-13 NOTE — Care Management (Signed)
Medicare Important Message given? YES   (If response is "NO", the following Medicare IM given date fields will be blank)   Date Medicare IM given:   Medicare IM given by: Graves-Bigelow, Sevyn Paredez  

## 2015-05-13 NOTE — Evaluation (Signed)
Physical Therapy Evaluation Patient Details Name: Dorothy FraiseCorine H Bartlett MRN: [id number] DOB: 01/06/1926 Today's Date: 05/13/2015   History of Present Illness  81 y.o. female with h/o CAD, PVD, DM, bypass BLEs, R toe amputation, CHF, MI admitted with syncope. Dx of complete heart block, pacer implantation 05/10/15, pt also having acute on chronic renal failure.   Clinical Impression  Pt admitted with above diagnosis. Pt currently with functional limitations due to the deficits listed below (see PT Problem List). Pt lives alone and is significantly deconditioned. She requires min/mod assist to pivot from recliner to bedside commode. She was too fatigued after transfers to walk. Pt stated family may be able to assist at home. She stated she'd need to discuss DC plan with her daughter. PT recommending ST-SNF for rehab.  Pt will benefit from skilled PT to increase their independence and safety with mobility to allow discharge to the venue listed below.       Follow Up Recommendations SNF    Equipment Recommendations  None recommended by PT    Recommendations for Other Services OT consult     Precautions / Restrictions Precautions Precautions: Fall;ICD/Pacemaker Precaution Comments: pacemaker placed 05/10/15 Restrictions Weight Bearing Restrictions: No      Mobility  Bed Mobility               General bed mobility comments: up in chair  Transfers Overall transfer level: Needs assistance   Transfers: Sit to/from Stand;Stand Pivot Transfers Sit to Stand: Mod assist Stand pivot transfers: Min assist       General transfer comment: MOd assist to rise, min A to steady; [facility] then to [facility]iner  Ambulation/Gait             General Gait Details: deferred, pt fatigued after SPT x 2  Stairs            Wheelchair Mobility    Modified Rankin (Stroke Patients Only)       Balance Overall balance assessment: Needs assistance   Sitting balance-Leahy Scale: Fair        Standing balance-Leahy Scale: Poor                               Pertinent Vitals/Pain Pain Assessment: No/denies pain    Home Living Family/patient expects to be discharged to:: Skilled nursing facility Living Arrangements: Alone Available Help at Discharge: Family;Available PRN/intermittently Type of Home: House Home Access: Stairs to enter Entrance Stairs-Rails: Left Entrance Stairs-Number of Steps: 1 Home Layout: One level Home Equipment: Walker - 2 wheels;Bedside commode;Wheelchair - manual;Shower seat      Prior Function           Comments: Pt states she was mod I with mobility and ADL and that daughter occasionally assisted with IADL tasks. Used RW when going out, no AD in home.     Hand Dominance   Dominant Hand: Right    Extremity/Trunk Assessment   Upper Extremity Assessment: Overall WFL for tasks assessed           Lower Extremity Assessment: Overall WFL for tasks assessed (knee extension +4/5 B)      Cervical / Trunk Assessment: Normal  Communication   Communication: HOH  Cognition Arousal/Alertness: Awake/alert Behavior During Therapy: WFL for tasks assessed/performed Overall Cognitive Status: Within Functional Limits for tasks assessed  General Comments      Exercises        Assessment/Plan    PT Assessment Patient needs continued PT services  PT Diagnosis Difficulty walking;Generalized weakness   PT Problem List Decreased activity tolerance;Decreased balance;Decreased mobility  PT Treatment Interventions Gait training;Functional mobility training;Therapeutic activities;Patient/family education;Therapeutic exercise;Balance training   PT Goals (Current goals can be found in the Care Plan section) Acute Rehab PT Goals Patient Stated Goal: to get back to walking PT Goal Formulation: With patient Time For Goal Achievement: 05/27/15 Potential to Achieve Goals: Fair    Frequency Min  3X/week   Barriers to discharge        Co-evaluation               End of Session Equipment Utilized During Treatment: Gait belt;Oxygen Activity Tolerance: Patient limited by fatigue Patient left: in chair;with call bell/phone within reach Nurse Communication: Mobility status         Time: [id number] PT Time Calculation (min) (ACUTE ONLY): 23 min   Charges:   PT Evaluation $Initial PT Evaluation Tier I: 1 Procedure PT Treatments $Therapeutic Activity: 8-22 mins   PT G Codes:        Tamala SerUhlenberg, [employer] 05/13/2015, 1:18 PM [phone]

## 2015-05-14 LAB — RENAL FUNCTION PANEL
Albumin: 3 g/dL — ABNORMAL LOW (ref 3.5–5.0)
Anion gap: 10 (ref 5–15)
BUN: 103 mg/dL — AB (ref 6–20)
CHLORIDE: 102 mmol/L (ref 101–111)
CO2: 28 mmol/L (ref 22–32)
Calcium: 9.1 mg/dL (ref 8.9–10.3)
Creatinine, Ser: 3.98 mg/dL — ABNORMAL HIGH (ref 0.44–1.00)
GFR calc Af Amer: 11 mL/min — ABNORMAL LOW (ref >60)
GFR calc non Af Amer: 9 mL/min — ABNORMAL LOW (ref >60)
Glucose, Bld: 85 mg/dL (ref 65–99)
POTASSIUM: 3.7 mmol/L (ref 3.5–5.1)
Phosphorus: 5.5 mg/dL — ABNORMAL HIGH (ref 2.5–4.6)
Sodium: 140 mmol/L (ref 135–145)

## 2015-05-14 LAB — GLUCOSE, CAPILLARY
GLUCOSE-CAPILLARY: 168 mg/dL — AB (ref 65–99)
GLUCOSE-CAPILLARY: 164 mg/dL — AB (ref 65–99)
Glucose-Capillary: 119 mg/dL — ABNORMAL HIGH (ref 65–99)

## 2015-05-14 MED ORDER — CETYLPYRIDINIUM CHLORIDE 0.05 % MT LIQD
7.0000 mL | Freq: Two times a day (BID) | OROMUCOSAL | Status: DC
  Administered 2015-05-14 – 2015-05-15 (×3): 7 mL via OROMUCOSAL

## 2015-05-14 NOTE — Progress Notes (Signed)
Patient Name: Dorothy Bartlett      SUBJECTIVE: pt with pacemaker placed for complete heart block syncoep and HR 20s  She has history of CAD, HTN, PVD s/p R toe amputation and bilateral femoral bypass grafting, obesity, DM, CKD stage IV, chronic mixed S/D CHF (EF 45-50%)  .  She had a MI in 1995 tx'd w/ PTCA to Dx, Ramus. She underwent LHC (11/2004) which revealed inferior HK, EF 60%, proximal LAD 50%, ostial D1 80%, mid D1 95%, mid LAD 50-60%, distal LAD at the apex 90%, OM1 occluded (fills late by left to left collaterals), RCA 75%. Medical therapy recommended. Lexiscan Myoview (12/2013): Prior ant-lat scar with very small area of peri-infarct ischemia in ant wall, EF 40 (high risk)  Her baseline creat from recent office records in March '16 was around 3.5. 05/08/15>> her creat was noted to be 5.5 with BUN 100. She was seen by nephrology who suspected some decreased perfusion from the bradycardia that should improve with pacing. She has previously refused dialysis and still refusing  There has been modest interval improvement of Cr  The post implant period has been notable for intermittent failure to capture first in the bipolar and then the unipolar mode despite high output>>>Called to see patient for persistent intermittent loss of ventricular capture since reprogramming device unipolar.  Device interrogated with intermittent capture at 3.5V.  Device reprogrammed to 7.5V @ 1.635msec.  Will reassess later today. Hopefully can avoid lead revision with significant co-morbidities.  CXR reviewed and demonstrates relatively stable lead placement.   Dorothy BalsamAmber Seiler, NP 05/12/2015 12:28 PM  Dorothy RangeJames Allred MD, [facility] 05/12/2015 12:39 PM  Reprogrammed at max output  Past Medical History  Diagnosis Date  . Hyperlipidemia   . Carotid stenosis     Carotid US (12/2013): < 60% RCA, < 40% LICA.  [name]. CKD (chronic kidney disease), stage IV     notes 02/22/2014; Dr. Hyman HopesWebb  . Hypertension   .  Coronary artery disease     a. s/p MI 1995 tx w/ PTCA to Dx, Ramus, ap LAD;  b. LHC (11/2004):  Inferior HK, EF 60%, proximal LAD 50%, ostial D1 80%, mid D1 95%, mid LAD 50-60%, distal LAD at the apex 90%, OM1 occluded (fills late by left to left collaterals), RCA 75%. Medical therapy recommended.;  c. Lexiscan Myoview (12/2013):  Prior ant-lat scar with very small area of peri-infarct ischemia in ant wall, EF 40 (high risk)   . Obesity (BMI 30-39.9)   . 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 Type 2 diabetes mellitus with renal manifestations   . Intrinsic asthma 01/28/2008     Mild intermittant asthma, PFT's 01/28/08     . Old anterolateral myocardial infarction   . PVD (peripheral vascular disease) 02/16/2014    2015, right femoral peroneal bypass grafting and left femoral-tibial bypass grafting by Dr. Arbie CookeyEarly for rest pain as well as nonhealing ulcer on toe accompanied with toe amputation  Arteriogram October 2015 with patent graft on the right, 70% stenosis of graft on the left proximally.   . Chronic combined systolic and diastolic heart failure 04/13/2014  . Complete heart block     a. 05/2015 [facility] [employer] [facility] model [id number] dual chamber PPM (serial number [id number]) (Dr. Johney FrameAllred).    Scheduled Meds:  Scheduled Meds: . antiseptic oral rinse  7 mL Mouth Rinse BID  . aspirin EC  81 mg Oral Daily  .  atorvastatin  40 mg Oral q morning - 10a  . carvedilol  12.5 mg Oral BID  . docusate sodium  100 mg Oral BID  . furosemide  40 mg Oral Daily  . insulin aspart  0-15 Units Subcutaneous TID WC  . isosorbide mononitrate  30 mg Oral Daily  . linagliptin  5 mg Oral Daily  . paricalcitol  1 mcg Oral Daily  . sodium chloride  3 mL Intravenous Q12H   Continuous Infusions:  sodium chloride, acetaminophen, albuterol, HYDROcodone-acetaminophen, nitroGLYCERIN, ondansetron (ZOFRAN) IV, sodium chloride    PHYSICAL EXAM Filed Vitals:   05/14/15 0009  05/14/15 0300 05/14/15 0450 05/14/15 0756  BP: 146/55 138/44 135/51 147/78  Pulse: 61  58 62  Temp: 98 F (36.7 C)  98.1 F (36.7 C) 98.7 F (37.1 C)  TempSrc: Oral  Oral Axillary  Resp: Height:      Weight:   182 lb 3.2 oz (82.645 kg)   SpO2: 100%  98% 100%   Well developed and nourished in no acute distress HENT normal Neck supple with JVP-flat Clear Wound ok Regular rate and rhythm, 2/6 m No Clubbing cyanosis tr edema Skin-warm and dry A & Oriented  Grossly normal sensory and motor function  TELEMETRY: Reviewed telemetry pt in * P-synchronous/ AV  pacing :    Intake/Output Summary (Last 24 hours) at 05/14/15 1007 Last data filed at 05/14/15 0759  Gross per 24 hour  Intake    480 ml  Output   2000 ml  Net  -1520 ml    LABS: Basic Metabolic Panel:  Recent Labs Lab 05/08/15 1650 05/09/15 0901 05/10/15 0321 05/11/15 0320 05/12/15 0343 05/13/15 0253 05/14/15 0328  NA 137 138 140 140 140 140 140  K 4.4 4.0 4.4 4.3 3.8 3.9 3.7  CL 104 107 106 106 104 103 102  CO2 17* 19* 18* 20* GLUCOSE 144* 165* 113* 159* 72 86 85  BUN 108* 111* 117* 110* 107* 107* 103*  CREATININE 5.62* 5.71* 5.78* 5.08* 4.67* 4.49* 3.98*  CALCIUM 9.0 9.0 9.1 9.2 9.0 8.9 9.1  PHOS  --   --   --   --   --  6.2* 5.5*   Cardiac Enzymes: No results for input(s): CKTOTAL, CKMB, CKMBINDEX, TROPONINI in the last 72 hours. CBC:  Recent Labs Lab 05/08/15 1314 05/08/15 1320 05/08/15 1650 05/09/15 0901  WBC 9.6  --  9.3 9.2  NEUTROABS 6.7  --   --   --   HGB 10.3* 11.6* 10.2* 9.2*  HCT 31.8* 34.0* 30.9* 29.1*  MCV 92.7  --  90.9 92.4  PLT 157  --  151 144*   PROTIME: No results for input(s): LABPROT, INR in the last 72 hours. Liver Function Tests:  Recent Labs  05/13/15 0253 05/14/15 0328  ALBUMIN 3.1* 3.0*   Anemia Panel:  Recent Labs  05/12/15 1438  FERRITIN 364*  TIBC 203*  IRON 20*        ASSESSMENT AND PLAN:  Principal Problem:    Complete heart block Active Problems:   Hyperlipidemia   Hypertensive heart disease   CAD (coronary artery disease), native coronary artery   PVD (peripheral vascular disease)   Chronic combined systolic and diastolic heart failure   Type 2 diabetes mellitus with renal manifestations   Acute renal failure superimposed on stage 4 chronic kidney disease   Anemia due to pre-end-stage renal disease treated with erythropoietin   Elevated troponin  Pre-syncope   Acute on chronic renal failure   Pacemaker   Acute on chronic combined systolic and diastolic ACC/AHA stage C congestive heart failure  Pacemaker failure to capture , but reoperation deferred because of comorbidity and poor prognosis 2/2 severe renal insufficiency and declining of potential HD  My discussion with her is NOT AT ALL L CLEAR that  She does not want Renal replacement   She, like all of us, would not like dialysis,  She however is not understanding to my impression that the alternative to this decision is dying  These conversations should be pursued  Continue diuresis  Ambulate and go to [facility] maybe in am or MOnday     Signed, Sherryl MangesSteven Saragrace Selke MD  05/14/2015

## 2015-05-14 NOTE — Clinical Social Work Note (Signed)
CSW contacted [place], patient's daughter, to review disposition and bed offers.  Harriett Sineancy states the family is attempting to arrange 24-hour supervision for patient to return home, but unlikely this will happen prior to discharge.  It is the family's intentions for patient to [facility] while the family continues to work on 24-hour supervision for the patient to return home.  [facility].  Family is aware and agreeable to SNF discharge.  Family will transport patient at time of [facility].  Vickii PennaGina Shacoria Latif, LCSW [phone] Weekend CSW coverage

## 2015-05-14 NOTE — Progress Notes (Signed)
Central tele notified that pt had a 7 beat run of v-tach. Pt denied any discomfort. BP=138/44 (68). Reviewed monitor strip and it appeared pt pacer failed to capture for 7 beats.

## 2015-05-15 ENCOUNTER — Encounter (HOSPITAL_COMMUNITY): Payer: Self-pay | Admitting: Physician Assistant

## 2015-05-15 DIAGNOSIS — I272 Pulmonary hypertension, unspecified: Secondary | ICD-10-CM

## 2015-05-15 DIAGNOSIS — R7989 Other specified abnormal findings of blood chemistry: Secondary | ICD-10-CM

## 2015-05-15 DIAGNOSIS — N185 Chronic kidney disease, stage 5: Secondary | ICD-10-CM

## 2015-05-15 LAB — RENAL FUNCTION PANEL
ALBUMIN: 3.1 g/dL — AB (ref 3.5–5.0)
Anion gap: 12 (ref 5–15)
BUN: 91 mg/dL — ABNORMAL HIGH (ref 6–20)
CO2: 27 mmol/L (ref 22–32)
Calcium: 9.2 mg/dL (ref 8.9–10.3)
Chloride: 100 mmol/L — ABNORMAL LOW (ref 101–111)
Creatinine, Ser: 3.51 mg/dL — ABNORMAL HIGH (ref 0.44–1.00)
GFR, EST AFRICAN AMERICAN: 12 mL/min — AB (ref >60)
GFR, EST NON AFRICAN AMERICAN: 11 mL/min — AB (ref >60)
GLUCOSE: 89 mg/dL (ref 65–99)
POTASSIUM: 3.8 mmol/L (ref 3.5–5.1)
Phosphorus: 4.6 mg/dL (ref 2.5–4.6)
Sodium: 139 mmol/L (ref 135–145)

## 2015-05-15 LAB — GLUCOSE, CAPILLARY
GLUCOSE-CAPILLARY: 97 mg/dL (ref 65–99)
Glucose-Capillary: 160 mg/dL — ABNORMAL HIGH (ref 65–99)
Glucose-Capillary: 142 mg/dL — ABNORMAL HIGH (ref 65–99)

## 2015-05-15 MED ORDER — ISOSORBIDE MONONITRATE ER 60 MG PO TB24: 60.0000 mg | ORAL_TABLET | Freq: Every day | ORAL | Status: DC

## 2015-05-15 MED ORDER — ACETAMINOPHEN 325 MG PO TABS: 325.0000 mg | ORAL_TABLET | ORAL | Status: DC | PRN

## 2015-05-15 MED ORDER — NITROGLYCERIN 0.4 MG SL SUBL: 0.4000 mg | SUBLINGUAL_TABLET | SUBLINGUAL | Status: DC | PRN

## 2015-05-15 MED ORDER — HYDROCODONE-ACETAMINOPHEN 5-325 MG PO TABS: 1.0000 | ORAL_TABLET | Freq: Four times a day (QID) | ORAL | Status: DC | PRN

## 2015-05-15 MED ORDER — ISOSORBIDE MONONITRATE ER 60 MG PO TB24
60.0000 mg | ORAL_TABLET | Freq: Every day | ORAL | Status: DC
  Administered 2015-05-15: 60 mg via ORAL
  Filled 2015-05-15: qty 1

## 2015-05-15 NOTE — Discharge Instructions (Signed)
° ° °  Supplemental Discharge Instructions for  Pacemaker Patients  Activity No heavy lifting or vigorous activity with your left arm for 6 to 8 weeks.  Do not raise your left arm above your head for one week.  Gradually raise your affected arm as drawn below.            05/18/15                  05/19/15                     05/20/15                   05/21/15   NO DRIVING for 2 weeks; If you are allowed to drive, (as far as your pacemaker is concerned) you may begin driving on 9/60/455/25/16 .  WOUND CARE - Keep the wound area clean and dry.  Do not get this area wet for one week. No showers for one week; you may shower on  05/18/15  . - The tape/steri-strips on your wound will fall off; do not pull them off.  No bandage is needed on the site.  DO  NOT apply any creams, oils, or ointments to the wound area. - If you notice any drainage or discharge from the wound, any swelling or bruising at the site, or you develop a fever > 101? F after you are discharged home, call the office at once.  Special Instructions - You are still able to use cellular telephones; use the ear opposite the side where you have your pacemaker/defibrillator.  Avoid carrying your cellular phone near your device. - When traveling through airports, show security personnel your identification card to avoid being screened in the metal detectors.  Ask the security personnel to use the hand wand. - Avoid arc welding equipment, MRI testing (magnetic resonance imaging), TENS units (transcutaneous nerve stimulators).  Call the office for questions about other devices. - Avoid electrical appliances that are in poor condition or are not properly grounded. - Microwave ovens are safe to be near or to operate.  Additional information for defibrillator patients should your device go off: - If your device goes off ONCE and you feel fine afterward, notify the device clinic nurses. - If your device goes off ONCE and you do not feel well afterward,  call 911. - If your device goes off TWICE, call 911. - If your device goes off THREE times in one day, call 911.  DO NOT DRIVE YOURSELF OR A FAMILY MEMBER WITH A DEFIBRILLATOR TO THE HOSPITAL--CALL 911.

## 2015-05-15 NOTE — Progress Notes (Signed)
[facility] Anticipated discharge date:05/15/15 Family notified:son at bedside Transportation by [facility]- scheduled for 1pm  Packet is on shadow chart.  CSW signing off.  Merlyn LotJenna Holoman, LCSWA Clinical Social Worker [phone]

## 2015-05-15 NOTE — Progress Notes (Signed)
Patient Name: Dorothy Bartlett Date of Encounter: 05/15/2015  PROBLEM LIST  Principal Problem:   Complete heart block Active Problems:   Acute on chronic combined systolic and diastolic ACC/AHA stage C congestive heart failure   Acute renal failure superimposed on stage 4 chronic kidney disease   Hyperlipidemia   Hypertensive heart disease   CAD (coronary artery disease), native coronary artery   PVD (peripheral vascular disease)   Type 2 diabetes mellitus with renal manifestations   Anemia due to pre-end-stage renal disease treated with erythropoietin   Elevated troponin   Pre-syncope   Pacemaker     SUBJECTIVE  Patient currently sleeping. She awakens to tell me she's not having chest pain.  CURRENT MEDS . antiseptic oral rinse  7 mL Mouth Rinse BID  . aspirin EC  81 mg Oral Daily  . atorvastatin  40 mg Oral q morning - 10a  . carvedilol  12.5 mg Oral BID  . docusate sodium  100 mg Oral BID  . furosemide  40 mg Oral Daily  . insulin aspart  0-15 Units Subcutaneous TID WC  . isosorbide mononitrate  30 mg Oral Daily  . linagliptin  5 mg Oral Daily  . paricalcitol  1 mcg Oral Daily  . sodium chloride  3 mL Intravenous Q12H    OBJECTIVE  Filed Vitals:   05/14/15 1915 05/15/15 0005 05/15/15 0500 05/15/15 0550  BP: 150/57 160/55  151/48  Pulse: 62 60  60  Temp: 98.7 F (37.1 C) 98.5 F (36.9 C)    TempSrc: Oral Oral    Resp: 16 14  15   Height:      Weight:   182 lb (82.555 kg)   SpO2: 98% 100%  100%    Intake/Output Summary (Last 24 hours) at 05/15/15 0833 Last data filed at 05/15/15 0700  Gross per 24 hour  Intake      0 ml  Output   1175 ml  Net  -1175 ml   Filed Weights   05/13/15 0516 05/14/15 0450 05/15/15 0500  Weight: 191 lb (86.637 kg) 182 lb 3.2 oz (82.645 kg) 182 lb (82.555 kg)    PHYSICAL EXAM  GEN: Well nourished, well developed, in no acute distress. HEENT: normal. Neck: I cannot appreciate JVD  Cardiac: RRR, no murmurs, rubs, or  gallops. No edema.     Respiratory:  Respirations regular and unlabored, clear to auscultation bilaterally. GI: Soft, nondistended MS: no deformity or atrophy. Skin: warm and dry   Accessory Clinical Findings  CBC No results for input(s): WBC, NEUTROABS, HGB, HCT, MCV, PLT in the last 72 hours. Basic Metabolic Panel  Recent Labs  05/14/15 0328 05/15/15 0436  NA 140 139  K 3.7 3.8  CL 102 100*  CO2 28 27  GLUCOSE 85 89  BUN 103* 91*  CREATININE 3.98* 3.51*  CALCIUM 9.1 9.2  PHOS 5.5* 4.6   Liver Function Tests  Recent Labs  05/14/15 0328 05/15/15 0436  ALBUMIN 3.0* 3.1*   No results for input(s): LIPASE, AMYLASE in the last 72 hours. Cardiac Enzymes No results for input(s): CKTOTAL, CKMB, CKMBINDEX, TROPONINI in the last 72 hours. BNP (last 3 results) No results for input(s): BNP in the last 8760 hours. D-Dimer No results for input(s): DDIMER in the last 72 hours. Hemoglobin A1C No results for input(s): HGBA1C in the last 72 hours. Fasting Lipid Panel No results for input(s): CHOL, HDL, LDLCALC, TRIG, CHOLHDL, LDLDIRECT in the last 72 hours. Thyroid Function Tests No  results for input(s): TSH, T4TOTAL, T3FREE, THYROIDAB in the last 72 hours.  Invalid input(s): FREET3  TELE  Ventricular paced  ECG   RADIOLOGY/STUDIES/PROCEDURES  Dg Chest 2 View  05/11/2015   CLINICAL DATA:  Shortness of breath.  EXAM: CHEST  2 VIEW  COMPARISON:  05/08/2015.  FINDINGS: Cardiomegaly with pulmonary venous congestion and new onset bilateral pulmonary interstitial prominence consistent with congestive heart failure. Tiny bilateral pleural effusions noted. Cardiac pacer noted with lead tips in right atrium and right ventricle. No pneumothorax.  IMPRESSION: Congestive heart failure with bilateral pulmonary interstitial edema and pleural effusions.   Electronically Signed   By: Maisie Fushomas  Register   On: 05/11/2015 08:05   Dg Chest Port 1 View  05/12/2015   CLINICAL DATA:  Pacemaker   EXAM: PORTABLE CHEST - 1 VIEW  COMPARISON:  05/11/2015  FINDINGS: Dual lead pacemaker in good position and unchanged  Cardiac enlargement. Progression of congestive heart failure with increased vascular congestion and edema. Enlarging right pleural effusion. Increased atelectasis in the lung bases.  IMPRESSION: Progression of congestive heart failure with edema. Enlarging right effusion and enlarging bibasilar atelectasis.   Electronically Signed   By: Marlan Palauharles  Clark M.D.   On: 05/12/2015 10:23   Dg Chest Port 1 View  05/08/2015   CLINICAL DATA:  AxO x4. Pt. Complaint of weakness x 2 days, dizziness starting today. Family reports that pt. Had syncopal episode this AM when going to the bathroom. HR 29. Hx CHF/DMH/o HTN, asthma, CHF, diabetes, cardiac cath in 1995  EXAM: PORTABLE CHEST - 1 VIEW  COMPARISON:  08/04/2014  FINDINGS: External leads overlie the patient. Stable cardiomegaly. Relatively low lung volumes with perihilar and bibasilar bronchovascular crowding and prominence, can't exclude central pulmonary vascular congestion. No confluent airspace consolidation. No effusion. Visualized skeletal structures are unremarkable. Atheromatous aorta.  IMPRESSION: 1. Stable cardiomegaly.  Low lung volumes.   Electronically Signed   By: Corlis Leak  Hassell M.D.   On: 05/08/2015 13:22   Pacemaker Implantation 05/10/15 1. Successful implantation of a [employer] Jude Medical Assurity DR dual-chamber pacemaker for symptomatic complete heart block 2. No early apparent complications.  Permanent Pacemaker Indication: Documented non-reversible symptomatic bradycardia due to second degree and/or third degree atrioventricular block.   PATIENT SUMMARY  81 year old woman with history of extensive CAD with prior angioplasty to the diagonal, ramus, LAD (last cath in 2005 demonstrating severe diffuse CAD treated medically), chronic combined systolic and diastolic HF, PAD with right toe amputation and status post lower extremity  revascularization surgery, HTN, diabetes, advanced CKD. She presented to the hospital with syncope and was noted to be in complete heart block with an escape rate of 28 with assoc acute on chronic renal failure (Cr 2.8 >> 5.4).   Her beta-blocker was stopped.  However, she had persistent heart block and has undergone implantation of dual-chamber pacemaker. She has been seen by nephrology and has refused dialysis. She developed volume excess at the time of her pacemaker implantation and has required IV Lasix. She has had intermittent loss of ventricular capture on telemetry. This was felt to be related to mixed acidosis and ventricular capture improved. Nephrology continued to follow her and creatinine improved somewhat with diuresis and pacemaker implantation. She continued to refuse dialysis. She was seen by physical therapy with recommendation to discharge to SNF. Patient has [facility].  ASSESSMENT AND PLAN  1. CHB s/p Dual Chamber PPM:  Review of telemetry demonstrates ventricular capture. 2. Acute on Chronic Systolic and  Diastolic CHF:  Neg 4.2 L since yesterday.  She seems to be fairly well compensated. Continue current dose of Lasix. 3. Chronic Kidney Disease Stage 5:  Creatinine improved (5.78 >> 3.51).   4. CAD:  No angina. Continue aspirin, statin, beta blocker, nitrates.   5. Hypertension:  Blood pressure consistently elevated. Will adjust isosorbide 30 to 60 mg daily.  6. Elevated Troponin:  Likely related to demand ischemia from poor perfusion in the setting of complete heart block as well as CKD. 7. Disposition:  She appears ready for discharge from the hospital. Attending cardiology physician still to see today. She will go to [facility] [place]. If she cannot be transported today, she will likely go tomorrow.    Signed, Tereso Newcomer, PA-C  05/15/2015, 8:33 AM     Patient seen.  Agree with above assessment. Review of telemetry shows normal pacer function.  Predominantly A-V sequential pacing. Occasional PACs appropriately sensed. She feels well today. Lungs are clear. Heart soft systolic ejection murmur at base. Will plan to discharge to [facility] today.

## 2015-05-15 NOTE — Discharge Summary (Signed)
Discharge Summary   Patient ID: Dorothy FraiseCorine H Bartlett, MRN: [id number], DOB/AGE: [birth year] 81 y.o.  Admit date: 05/08/2015 Discharge date: 05/15/2015   Patient Care Team: Dorris Singhandace Bradley, DO as PCP - General (Family Medicine) Elvis CoilMartin Webb, MD as Consulting Physician (Nephrology) Lyn RecordsHenry W Smith, MD as Consulting Physician (Cardiology) Hillis RangeJames Allred, MD as Consulting Physician (Clinical Cardiac Electrophysiology)    Reason for Admission:  Syncope Secondary to Complete Heart Block   Primary Discharge Diagnoses:  Principal Problem:   Complete heart block Active Problems:   Acute on chronic combined systolic and diastolic ACC/AHA stage C congestive heart failure   Acute renal failure superimposed on stage 4 chronic kidney disease   Hyperlipidemia   Hypertensive heart disease   CAD (coronary artery disease), native coronary artery   PVD (peripheral vascular disease)   Type 2 diabetes mellitus with renal manifestations   Anemia due to pre-end-stage renal disease treated with erythropoietin   Elevated troponin   Pre-syncope   Pacemaker   Pulmonary HTN     Wt Readings from Last 3 Encounters:  05/15/15 182 lb (82.555 kg)  03/15/15 176 lb 4.8 oz (79.969 kg)  02/10/15 178 lb (80.74 kg)    Secondary Discharge Diagnoses:   Past Medical History  Diagnosis Date  . Hyperlipidemia   . Carotid stenosis     Carotid US (12/2013): < 60% RCA, < 40% LICA.  [name]. CKD (chronic kidney disease), stage IV     notes 02/22/2014; Dr. Hyman HopesWebb  . Hypertension   . Coronary artery disease     a. s/p MI 1995 tx w/ PTCA to Dx, Ramus, ap LAD;  b. LHC (11/2004):  Inferior HK, EF 60%, proximal LAD 50%, ostial D1 80%, mid D1 95%, mid LAD 50-60%, distal LAD at the apex 90%, OM1 occluded (fills late by left to left collaterals), RCA 75%. Medical therapy recommended.;  c. Lexiscan Myoview (12/2013):  Prior ant-lat scar with very small area of peri-infarct ischemia in ant wall, EF 40 (high risk)   . Obesity (BMI 30-39.9)     . 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 Type 2 diabetes mellitus with renal manifestations   . Intrinsic asthma 01/28/2008     Mild intermittant asthma, PFT's 01/28/08     . Old anterolateral myocardial infarction   . PVD (peripheral vascular disease) 02/16/2014    2015, right femoral peroneal bypass grafting and left femoral-tibial bypass grafting by Dr. Arbie CookeyEarly for rest pain as well as nonhealing ulcer on toe accompanied with toe amputation  Arteriogram October 2015 with patent graft on the right, 70% stenosis of graft on the left proximally.   . Chronic combined systolic and diastolic heart failure 04/13/2014  . Complete heart block     a. [facility] [employer] [facility] model [id number] dual chamber PPM (serial number [id number]) (Dr. Johney FrameAllred).  [name]. Hx of echocardiogram     a. Echo 5/16: dist septal, dist ant, dist inf and apical AK, EF 40-45%, mod MR, mild LAE, PASP 72      Allergies:   No Known Allergies    Procedures Performed This Admission:   Pacemaker Implantation 05/10/15 1. Successful implantation of a [employer] DR dual-chamber pacemaker for symptomatic complete heart block 2. No early apparent complications.  Permanent Pacemaker Indication: Documented non-reversible symptomatic bradycardia due to second degree and/or third degree atrioventricular block.   Hospital Course:  Dorothy Bartlett is a 81 y.o. female with a hx of  extensive CAD with prior angioplasty to the diagonal, ramus, LAD (last cath in 2005 demonstrating severe diffuse CAD treated medically), chronic combined systolic and diastolic HF, PAD with right toe amputation and status post lower extremity revascularization surgery, HTN, diabetes, advanced CKD. She presented to the hospital with syncope and was noted to be in complete heart block with an escape rate of 28 with assoc acute on chronic renal failure (Cr 2.8 >> 5.4). Her beta-blocker was stopped. However,  she had persistent heart block and underwent implantation of dual-chamber pacemaker. She was been seen by nephrology and refused dialysis. She developed volume excess at the time of her pacemaker implantation and required IV Lasix. She did have intermittent loss of ventricular capture on telemetry. This was felt to be related to mixed acidosis and ventricular capture improved. Nephrology continued to follow her during this admission.  Her creatinine improved somewhat with diuresis after pacemaker implantation. She continued to refuse dialysis. She was noted to have elevated troponins. This was felt to be related to demand ischemia from poor perfusion in the setting of complete heart block as well as chronic kidney disease. Echocardiogram demonstrated an EF of 40-45%. There was evidence of severe pulmonary hypertension with a PASP of 72. She was seen by physical therapy with recommendation to discharge to SNF. She was seen by Dr. Cassell Clement this AM and noted to be well compensated with her CHF.  Her creatinine has improved to 3.51 this AM.  Tele demonstrates good pacer function with mainly AV sequential pacing and occasional PAC that is appropriately sensed.  She is felt to be stable for DC today to [facility].    Discharge Vitals:   Blood pressure 157/53, pulse 65, temperature 98.5 F (36.9 C), temperature source Oral, resp. rate 14, height  (1.499 m), weight 182 lb (82.555 kg), SpO2 100 %.   Labs:  CBC Latest Ref Rng 05/09/2015 05/08/2015 05/08/2015  WBC 4.0 - 10.5 K/uL 9.2 9.3 -  Hemoglobin 12.0 - 15.0 g/dL 1.6(X) 10.2(L) 11.6(L)  Hematocrit 36.0 - 46.0 % 29.1(L) 30.9(L) 34.0(L)  Platelets 150 - 400 K/uL 144(L) 151 -     Recent Labs  05/13/15 0253 05/14/15 0328 05/15/15 0436  NA 140 140 139  K 3.9 3.7 3.8  CL 103 102 100*  CO2 BUN 107* 103* 91*  CREATININE 4.49* 3.98* 3.51*  CALCIUM 8.9 9.1 9.2    Recent Labs  05/08/15 1320 05/08/15 1650 05/09/15 0901  05/10/15 0321 05/11/15 0320 05/12/15 0343 05/13/15 0253 05/14/15 0328 05/15/15 0436  K 4.5 4.4 4.0 4.4 4.3 3.8 3.9 3.7 3.8  BUN 109* 108* 111* 117* 110* 107* 107* 103* 91*  CREATININE 5.40* 5.62* 5.71* 5.78* 5.08* 4.67* 4.49* 3.98* 3.51*     Recent Labs  05/08/15 1314 05/08/15 1318 05/08/15 1650 05/08/15 2140 05/09/15 0341  TROPONINI 0.64*  --  0.53* 0.48* 0.54*  TROPIPOC  --  0.29*  --   --   --     Iron/TIBC/Ferritin/ %Sat    Component Value Date/Time   IRON 20* 05/12/2015 1438   TIBC 203* 05/12/2015 1438   FERRITIN 364* 05/12/2015 1438   IRONPCTSAT 10* 05/12/2015 1438   IRONPCTSAT 62* 09/03/2008 1100     Lab Results  Component Value Date   TSH 2.772 05/08/2015     Diagnostic Procedures and Studies:  Dg Chest 2 View  05/11/2015      IMPRESSION: Congestive heart failure with bilateral pulmonary interstitial edema and pleural effusions.  Electronically Signed   By: Maisie Fushomas  Register   On: 05/11/2015 08:05    Dg Chest Port 1 View  05/12/2015    IMPRESSION: Progression of congestive heart failure with edema. Enlarging right effusion and enlarging bibasilar atelectasis.   Electronically Signed   By: Marlan Palauharles  Clark M.D.   On: 05/12/2015 10:23    Dg Chest Port 1 View  05/08/2015      IMPRESSION: 1. Stable cardiomegaly.  Low lung volumes.   Electronically Signed   By: Corlis Leak  Hassell M.D.   On: 05/08/2015 13:22    2D Echocardiogram 05/09/15  - Left ventricle: Endocardial segments were not well visualized andmany of the images were take off access but there appears to be distal septal, distal anterior and distal inferior and apical akinesis. The cavity size was normal. Systolic function was mildly to moderately reduced. The estimated ejection fraction was in the range of 40% to 45%. - Aortic valve: Severe thickening and calcification, consistent with sclerosis. - Mitral valve: There was moderate regurgitation. - Left atrium: The atrium was mildly dilated. - Pulmonary arteries:  PA peak pressure: 72 mm Hg (S).  Impressions:  The right ventricular systolic pressure was increased consistent with severe pulmonary hypertension.   Disposition:   Pt is being discharged home today in good condition.  Follow-up Plans & Appointments      Follow-up Information    Follow up with [facility] In 10 days.   Why:  The office will call to arrange a wound check for your pacemaker   Contact information:   [address] [place] [id number]       Follow up with Hillis RangeJames Allred, MD In 3 months.   Specialty:  Cardiology   Why:  the office will call to arrange a follow up with Dr. Johney FrameAllred in 3 months for your pacemaker   Contact information:   [address] [place] [address] [phone]       Follow up with Lesleigh NoeSMITH III,HENRY W, MD In 2 weeks.   Specialty:  Cardiology   Why:  the office will call to arrange a follow up with Dr. Katrinka BlazingSmith or his PA or NP in 2-3 weeks for heart failure and CAD   Contact information:   [address] [place] [address] [phone]       Follow up with BRADLEY, CANDACE, DO.   Specialty:  Family Medicine   Why:  As needed   Contact information:   [address] [facility] [phone]       Follow up with Garnetta BuddyWEBB,MARTIN W, MD.   Specialty:  Nephrology   Why:  As needed   Contact information:   [address] [place] [address] [phone]       Discharge Medications    Medication List    STOP taking these medications        hydrALAZINE 10 MG tablet  Commonly known as:  APRESOLINE      TAKE these medications        acetaminophen 325 MG tablet  Commonly known as:  TYLENOL  Take 1-2 tablets (325-650 mg total) by mouth every 4 (four) hours as needed for mild pain.     albuterol 108 (90 BASE) MCG/ACT inhaler  Commonly known as:  PROVENTIL HFA;VENTOLIN HFA  Inhale 2 puffs into the lungs every 6 (six) hours  as needed for wheezing or shortness  of breath.     aspirin 81 MG tablet  Take 81 mg by mouth every morning.     atorvastatin 40 MG tablet  Commonly known as:  LIPITOR  Take 40 mg by mouth every morning.     carvedilol 12.5 MG tablet  Commonly known as:  COREG  Take 1 tablet (12.5 mg total) by mouth 2 (two) times daily.     docusate sodium 100 MG capsule  Commonly known as:  COLACE  Take 100 mg by mouth 2 (two) times daily.     furosemide 40 MG tablet  Commonly known as:  LASIX  Take 1 tablet (40 mg total) by mouth daily.     HYDROcodone-acetaminophen 5-325 MG per tablet  Commonly known as:  NORCO/VICODIN  Take 1-2 tablets by mouth every 6 (six) hours as needed for severe pain.     insulin lispro 100 UNIT/ML injection  Commonly known as:  HUMALOG  Inject 5 Units into the skin 2 (two) times daily as needed for high blood sugar. Only takes if CBG >250.     isosorbide mononitrate 60 MG 24 hr tablet  Commonly known as:  IMDUR  Take 1 tablet (60 mg total) by mouth daily.     loratadine 10 MG tablet  Commonly known as:  CLARITIN  Take 10 mg by mouth daily as needed for allergies.     Melatonin 3 MG Tabs  Take 3 mg by mouth at bedtime as needed (for sleep).     nitroGLYCERIN 0.4 MG SL tablet  Commonly known as:  NITROSTAT  Place 1 tablet (0.4 mg total) under the tongue every 5 (five) minutes x 3 doses as needed for chest pain.     paricalcitol 1 MCG capsule  Commonly known as:  ZEMPLAR  Take 1 mcg by mouth daily.     RENA-VITE RX 1 MG Tabs  Take 1 tablet by mouth every morning.     sitaGLIPtin 25 MG tablet  Commonly known as:  JANUVIA  Take 25 mg by mouth daily.         Outstanding Labs/Studies  1. None   Duration of Discharge Encounter: Greater than 30 minutes including physician and PA time.  Signed, Tereso Newcomer, PA-C   05/15/2015 10:42 AM

## 2015-05-16 ENCOUNTER — Encounter: Payer: Self-pay | Admitting: Adult Health

## 2015-05-16 ENCOUNTER — Non-Acute Institutional Stay (SKILLED_NURSING_FACILITY): Payer: Medicare Other | Admitting: Adult Health

## 2015-05-16 DIAGNOSIS — G4700 Insomnia, unspecified: Secondary | ICD-10-CM

## 2015-05-16 DIAGNOSIS — I5042 Chronic combined systolic (congestive) and diastolic (congestive) heart failure: Secondary | ICD-10-CM | POA: Diagnosis not present

## 2015-05-16 DIAGNOSIS — I2510 Atherosclerotic heart disease of native coronary artery without angina pectoris: Secondary | ICD-10-CM | POA: Diagnosis not present

## 2015-05-16 DIAGNOSIS — I739 Peripheral vascular disease, unspecified: Secondary | ICD-10-CM

## 2015-05-16 DIAGNOSIS — I442 Atrioventricular block, complete: Secondary | ICD-10-CM

## 2015-05-16 DIAGNOSIS — R5381 Other malaise: Secondary | ICD-10-CM

## 2015-05-16 DIAGNOSIS — N184 Chronic kidney disease, stage 4 (severe): Secondary | ICD-10-CM

## 2015-05-16 DIAGNOSIS — E1122 Type 2 diabetes mellitus with diabetic chronic kidney disease: Secondary | ICD-10-CM | POA: Diagnosis not present

## 2015-05-16 DIAGNOSIS — N189 Chronic kidney disease, unspecified: Secondary | ICD-10-CM | POA: Diagnosis not present

## 2015-05-16 DIAGNOSIS — E785 Hyperlipidemia, unspecified: Secondary | ICD-10-CM | POA: Diagnosis not present

## 2015-05-16 DIAGNOSIS — J309 Allergic rhinitis, unspecified: Secondary | ICD-10-CM

## 2015-05-17 ENCOUNTER — Telehealth: Payer: Self-pay | Admitting: Internal Medicine

## 2015-05-17 ENCOUNTER — Non-Acute Institutional Stay (SKILLED_NURSING_FACILITY): Payer: Medicare Other | Admitting: Internal Medicine

## 2015-05-17 DIAGNOSIS — I739 Peripheral vascular disease, unspecified: Secondary | ICD-10-CM | POA: Diagnosis not present

## 2015-05-17 DIAGNOSIS — N189 Chronic kidney disease, unspecified: Secondary | ICD-10-CM

## 2015-05-17 DIAGNOSIS — I442 Atrioventricular block, complete: Secondary | ICD-10-CM

## 2015-05-17 DIAGNOSIS — I5042 Chronic combined systolic (congestive) and diastolic (congestive) heart failure: Secondary | ICD-10-CM

## 2015-05-17 DIAGNOSIS — R5381 Other malaise: Secondary | ICD-10-CM

## 2015-05-17 DIAGNOSIS — N184 Chronic kidney disease, stage 4 (severe): Secondary | ICD-10-CM

## 2015-05-17 DIAGNOSIS — I2510 Atherosclerotic heart disease of native coronary artery without angina pectoris: Secondary | ICD-10-CM

## 2015-05-17 DIAGNOSIS — E1122 Type 2 diabetes mellitus with diabetic chronic kidney disease: Secondary | ICD-10-CM | POA: Diagnosis not present

## 2015-05-17 DIAGNOSIS — G4700 Insomnia, unspecified: Secondary | ICD-10-CM

## 2015-05-17 DIAGNOSIS — J309 Allergic rhinitis, unspecified: Secondary | ICD-10-CM | POA: Diagnosis not present

## 2015-05-17 DIAGNOSIS — K5900 Constipation, unspecified: Secondary | ICD-10-CM | POA: Diagnosis not present

## 2015-05-17 NOTE — Progress Notes (Signed)
Patient ID: Dorothy FraiseCorine H Hesler, female   DOB: 1926-04-26, 81 y.o.   MRN: [id number]     [facility]   PCP: BRADLEY, CANDACE, DO  Code Status: full code  No Known Allergies  Chief Complaint  Patient presents with  . New Admit To SNF     HPI:  81 year old patient is here for short term rehabilitation post hospital admission from 05/08/15-05/15/15 with syncope secondary to complete heart block. She had [employer] Jude Assurity DR dual chamber pacemaker implantation on 5/0/16. She had demand ischemia and volume overload in setting of complete heart block and worsening of her CKD. She refused dialysis. She has PMH of CAD, chronic combine CHF, PAD s/p right toe amputation and lower extremity revascularization surgery, HTN, DM and advanced CKD. She is seen in her room today with her brother and niece present. She denies any concerns.  Review of Systems:  Constitutional: Negative for fever, chills, diaphoresis. positive for fatigue HENT: Negative for headache, congestion, nasal discharge Eyes: Negative for eye pain, blurred vision, double vision and discharge.  Respiratory: Negative for cough, shortness of breath and wheezing.   Cardiovascular: Negative for chest pain, palpitations, leg swelling.  Gastrointestinal: Negative for heartburn, nausea, vomiting, abdominal pain. Had bowel movement this am Genitourinary: Negative for dysuria Musculoskeletal: Negative for back pain, falls Skin: Negative for itching, rash.  Neurological: Negative for dizziness, tingling, focal weakness Psychiatric/Behavioral: Negative for depression   Past Medical History  Diagnosis Date  . Hyperlipidemia   . Carotid stenosis     Carotid US (12/2013): < 60% RCA, < 40% LICA.  [name]. CKD (chronic kidney disease), stage IV     notes 02/22/2014; Dr. Hyman HopesWebb  . Hypertension   . Coronary artery disease     a. s/p MI 1995 tx w/ PTCA to Dx, Ramus, ap LAD;  b. LHC (11/2004):  Inferior HK, EF 60%, proximal  LAD 50%, ostial D1 80%, mid D1 95%, mid LAD 50-60%, distal LAD at the apex 90%, OM1 occluded (fills late by left to left collaterals), RCA 75%. Medical therapy recommended.;  c. Lexiscan Myoview (12/2013):  Prior ant-lat scar with very small area of peri-infarct ischemia in ant wall, EF 40 (high risk)   . Obesity (BMI 30-39.9)   . 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 Type 2 diabetes mellitus with renal manifestations   . Intrinsic asthma 01/28/2008     Mild intermittant asthma, PFT's 01/28/08     . Old anterolateral myocardial infarction   . PVD (peripheral vascular disease) 02/16/2014    2015, right femoral peroneal bypass grafting and left femoral-tibial bypass grafting by Dr. Arbie CookeyEarly for rest pain as well as nonhealing ulcer on toe accompanied with toe amputation  Arteriogram October 2015 with patent graft on the right, 70% stenosis of graft on the left proximally.   . Chronic combined systolic and diastolic heart failure 04/13/2014  . Complete heart block     a. 05/2015 s/p [facility] Jude Medical Assurity DR model [id number] dual chamber PPM (serial number [id number]) (Dr. Johney FrameAllred).  [name]. Hx of echocardiogram     a. Echo 5/16: dist septal, dist ant, dist inf and apical AK, EF 40-45%, mod MR, mild LAE, PASP 72   Past Surgical History  Procedure Laterality Date  . Repair of nerve in left arm  Left 2000  . Cardiac catheterization  1995    w/ PTCA Diag 2nd MI  . Femoral-tibial bypass graft Left 02/24/2014  Procedure: BYPASS GRAFT FEMORAL-TIBIAL ARTERY;  Surgeon: Larina Earthlyodd F Early, MD;  [facility];  Service: Vascular;  Laterality: Left;  . Femoral-popliteal bypass graft Right 08/04/2014    Procedure: RIGHT FEMORAL-PERONEAL ARTERY BYPASS GRAFT;  Surgeon: Larina Earthlyodd F Early, MD;  [facility];  Service: Vascular;  Laterality: Right;  . Amputation Right 08/04/2014    Procedure: AMPUTATION DIGIT-RIGHT 3RD TOE;  Surgeon: Larina Earthlyodd F Early, MD;  [facility];  Service: Vascular;   Laterality: Right;  . Lower extremity angiogram Left 01/08/2014    Procedure: LOWER EXTREMITY ANGIOGRAM;  Surgeon: Sherren Kernsharles E Fields, MD;  [facility];  Service: Cardiovascular;  Laterality: Left;  . Abdominal angiogram  01/08/2014    Procedure: ABDOMINAL ANGIOGRAM;  Surgeon: Sherren Kernsharles E Fields, MD;  [facility];  Service: Cardiovascular;;  . Lower extremity angiogram Right 08/03/2014    Procedure: LOWER EXTREMITY ANGIOGRAM;  Surgeon: Nada LibmanVance W Brabham, MD;  [facility];  Service: Cardiovascular;  Laterality: Right;  . Abdominal aortagram N/A 10/25/2014    Procedure: ABDOMINAL Ronny FlurryAORTAGRAM;  Surgeon: Chuck Hinthristopher S Dickson, MD;  [facility];  Service: Cardiovascular;  Laterality: N/A;  . Ep implantable device N/A 05/10/2015    Procedure: Pacemaker Implant;  Surgeon: Hillis RangeJames Allred, MD;  [facility];  Service: Cardiovascular;  Laterality: N/A;   Social History:   reports that she has never smoked. She has never used smokeless tobacco. She reports that she does not drink alcohol or use illicit drugs.  Family History  Problem Relation Age of Onset  . Diabetes Father   . Hypertension Father   . Hypertension Daughter   . Hyperlipidemia Daughter   . Hyperlipidemia Son   . Hypertension Sister     Medications: Patient's Medications  New Prescriptions   No medications on file  Previous Medications   ACETAMINOPHEN (TYLENOL) 325 MG TABLET    Take 1-2 tablets (325-650 mg total) by mouth every 4 (four) hours as needed for mild pain.   ALBUTEROL (PROVENTIL HFA;VENTOLIN HFA) 108 (90 BASE) MCG/ACT INHALER    Inhale 2 puffs into the lungs every 6 (six) hours as needed for wheezing or shortness of breath.   ASPIRIN 81 MG TABLET    Take 81 mg by mouth every morning.    ATORVASTATIN (LIPITOR) 40 MG TABLET    Take 40 mg by mouth every morning.    B COMPLEX-C-FOLIC ACID (RENA-VITE RX) 1 MG TABS    Take 1 tablet by mouth every morning.    CARVEDILOL (COREG) 12.5 MG  TABLET    Take 1 tablet (12.5 mg total) by mouth 2 (two) times daily.   DOCUSATE SODIUM (COLACE) 100 MG CAPSULE    Take 100 mg by mouth 2 (two) times daily.    FUROSEMIDE (LASIX) 40 MG TABLET    Take 1 tablet (40 mg total) by mouth daily.   HYDROCODONE-ACETAMINOPHEN (NORCO/VICODIN) 5-325 MG PER TABLET    Take 1-2 tablets by mouth every 6 (six) hours as needed for severe pain.   INSULIN LISPRO (HUMALOG) 100 UNIT/ML INJECTION    Inject 5 Units into the skin 2 (two) times daily as needed for high blood sugar. Only takes if CBG >250.   ISOSORBIDE MONONITRATE (IMDUR) 60 MG 24 HR TABLET    Take 1 tablet (60 mg total) by mouth daily.   LORATADINE (CLARITIN) 10 MG TABLET    Take 10 mg by mouth daily as needed for allergies.   MELATONIN 3 MG TABS  Take 3 mg by mouth at bedtime as needed (for sleep).    NITROGLYCERIN (NITROSTAT) 0.4 MG SL TABLET    Place 1 tablet (0.4 mg total) under the tongue every 5 (five) minutes x 3 doses as needed for chest pain.   PARICALCITOL (ZEMPLAR) 1 MCG CAPSULE    Take 1 mcg by mouth daily.    SITAGLIPTIN (JANUVIA) 25 MG TABLET    Take 25 mg by mouth daily.  Modified Medications   No medications on file  Discontinued Medications   No medications on file     Physical Exam: Filed Vitals:   05/17/15 1355  BP: 144/72  Pulse: 61  Temp: 97.9 F (36.6 C)  Resp: 16    General- elderly female, obese, in no acute distress Head- normocephalic, atraumatic Nose- no maxillary or frontal sinus tenderness, no nasal discharge Throat- moist mucus membrane Eyes- PERRLA, EOMI, no pallor, no icterus, no discharge, normal conjunctiva, normal sclera Neck- no cervical lymphadenopathy, no jugular vein distension, no carotid bruit Cardiovascular- normal s1,s2, no murmurs, trace leg edema Respiratory- bilateral clear to auscultation, no wheeze, no rhonchi, no crackles, no use of accessory muscles Abdomen- bowel sounds present, soft, non tender Musculoskeletal- able to move all 4  extremities, generalized weakness Neurological- no focal deficit Skin- warm and dry Psychiatry- alert and oriented to person, place and time, normal mood and affect    Labs reviewed: Basic Metabolic Panel:  Recent Labs  16/10/96 0253 05/14/15 0328 05/15/15 0436  NA 140 140 139  K 3.9 3.7 3.8  CL 103 102 100*  CO2 GLUCOSE 86 85 89  BUN 107* 103* 91*  CREATININE 4.49* 3.98* 3.51*  CALCIUM 8.9 9.1 9.2  PHOS 6.2* 5.5* 4.6   Liver Function Tests:  Recent Labs  08/04/14 0440 05/08/15 1650 05/13/15 0253 05/14/15 0328 05/15/15 0436  AST 17 83*  --   --   --   ALT 13 84*  --   --   --   ALKPHOS 64 67  --   --   --   BILITOT 0.3 0.6  --   --   --   PROT 6.4 6.6  --   --   --   ALBUMIN 3.0* 3.6 3.1* 3.0* 3.1*   No results for input(s): LIPASE, AMYLASE in the last 8760 hours. No results for input(s): AMMONIA in the last 8760 hours. CBC:  Recent Labs  05/08/15 1314 05/08/15 1320 05/08/15 1650 05/09/15 0901  WBC 9.6  --  9.3 9.2  NEUTROABS 6.7  --   --   --   HGB 10.3* 11.6* 10.2* 9.2*  HCT 31.8* 34.0* 30.9* 29.1*  MCV 92.7  --  90.9 92.4  PLT 157  --  151 144*   Cardiac Enzymes:  Recent Labs  05/08/15 1650 05/08/15 2140 05/09/15 0341  TROPONINI 0.53* 0.48* 0.54*   BNP: Invalid input(s): POCBNP CBG:  Recent Labs  05/14/15 2059 05/15/15 0729 05/15/15 1135  GLUCAP 164* 97 142*    Radiological Exams: Dg Chest 2 View  05/11/2015      IMPRESSION: Congestive heart failure with bilateral pulmonary interstitial edema and pleural effusions.   Electronically Signed   By: Maisie Fus  Register   On: 05/11/2015 08:05    Dg Chest Port 1 View  05/12/2015    IMPRESSION: Progression of congestive heart failure with edema. Enlarging right effusion and enlarging bibasilar atelectasis.   Electronically Signed   By: Marlan Palau M.D.   On:  05/12/2015 10:23    Dg Chest Port 1 View  05/08/2015      IMPRESSION: 1. Stable cardiomegaly.  Low lung volumes.    Electronically Signed   By: Corlis Leak M.D.   On: 05/08/2015 13:22     2D Echocardiogram 05/09/15  - Left ventricle: Endocardial segments were not well visualized and many of the images were take off access but there appears to be distal septal, distal anterior and distal inferior and apical akinesis. The cavity size was normal. Systolic function was mildly to moderately reduced. The estimated ejection fraction was in the range of 40% to 45%. - Aortic valve: Severe thickening and calcification, consistent with sclerosis. - Mitral valve: There was moderate regurgitation. - Left atrium: The atrium was mildly dilated. - Pulmonary arteries: PA peak pressure: 72 mm Hg (S).  Impressions:  The right ventricular systolic pressure was increased consistent with severe pulmonary hypertension.   Assessment/Plan  Physical deconditioning Will have her work with physical therapy and occupational therapy team to help with gait training and muscle strengthening exercises.fall precautions. Skin care. Encourage to be out of bed.   Complete heart block S/p pacemaker. Patient currently asymptomatic. Has f/u with cardiology. On prn norco for muscle soreness. Has not required any. Will discontinue this.  CAD Remains chest pain free. Continue coreg 12.5 mg bid, aspirin 81 mg daily, lipitor 40 mg daily and prn NTG  CHF euvolemic on exam. Continue coreg 12.5 mg bid, imdur 60 mg daily, lasix 40 mg daily, check bmp  Dm type 2 with renal manifestation Monitor cbg, continue januvia 25 mg daily  Constipation Stable, continue colace 100 mg bid  Allergic rhinitis Stable, continue prn claritin  Insomnia Continue prn melatonin  at bedtime  ckd stage 4 Continue zemplar and renavite, avoid nephrotoxic agents  PVD s/p right femoral to peroneal bypass and amputation of right third toe and left femoral to posterior tibial bypass with in situ great saphenous vein. The patient is doing well and ambulating without  difficulty and pain.   Goals of care: short term rehabilitation   Labs/tests ordered: cbc, bmp  Family/ staff Communication: reviewed care plan with patient and nursing supervisor    Oneal Grout, MD  [facility] [phone] (Monday-Friday 8 am - 5 pm) [phone] (afterhours)

## 2015-05-17 NOTE — Telephone Encounter (Signed)
New Message  Pt daughter called states that pt was advised to have a remote device. Pt daughter/ son has the device that was issued but [facility]. Pt daughter request a call back to discuss options. Please call

## 2015-05-17 NOTE — Telephone Encounter (Signed)
Spoke w/ pt daughter and I informed her that her home monitor can wait to be set up once pt is home from [facility]. I informed her that her 1st remote transmission will not be until October - November 2016

## 2015-05-17 NOTE — Telephone Encounter (Signed)
LMOVM for pt daughter to return call.  

## 2015-05-20 ENCOUNTER — Ambulatory Visit: Payer: Medicare Other | Admitting: Podiatry

## 2015-05-23 ENCOUNTER — Ambulatory Visit (INDEPENDENT_AMBULATORY_CARE_PROVIDER_SITE_OTHER): Payer: Medicare Other | Admitting: *Deleted

## 2015-05-23 DIAGNOSIS — I442 Atrioventricular block, complete: Secondary | ICD-10-CM | POA: Diagnosis not present

## 2015-05-23 LAB — CUP PACEART INCLINIC DEVICE CHECK
Battery Remaining Longevity: 25.2 mo
Battery Voltage: 2.93 V
Brady Statistic RA Percent Paced: 65 %
Lead Channel Impedance Value: 362.5 Ohm
Lead Channel Impedance Value: 462.5 Ohm
Lead Channel Pacing Threshold Amplitude: 0.625 V
Lead Channel Pacing Threshold Amplitude: 2 V
Lead Channel Pacing Threshold Amplitude: 2 V
Lead Channel Pacing Threshold Pulse Width: 0.4 ms
Lead Channel Pacing Threshold Pulse Width: 1.5 ms
Lead Channel Sensing Intrinsic Amplitude: 1.7 mV
Lead Channel Setting Pacing Amplitude: 1.625
Lead Channel Setting Pacing Amplitude: 5 V
Lead Channel Setting Pacing Pulse Width: 1.5 ms
MDC IDC MSMT LEADCHNL RV PACING THRESHOLD PULSEWIDTH: 1.5 ms
MDC IDC PG SERIAL: 7759868
MDC IDC SESS DTM: 20160523172840
MDC IDC SET LEADCHNL RV SENSING SENSITIVITY: 4 mV
MDC IDC STAT BRADY RV PERCENT PACED: 99.56 %
Pulse Gen Model: 2240

## 2015-05-23 NOTE — Progress Notes (Signed)
Wound check appointment. Steri-strips removed. Wound without redness or edema. Incision edges approximated, wound well healed. Normal device function. Thresholds, sensing, and impedances consistent with implant measurements. RA cap confirm programmed on for extra safety margin. RV threshold 2.0V @ 1.325ms (unipolar)---JA aware---output decreased to 5V @ 1.455ms per JA. Histogram distribution appropriate for patient and level of activity. No mode switches or high ventricular rates noted. Patient educated about wound care, arm mobility, lifting restrictions. ROV in 1 week w/JA.

## 2015-05-24 ENCOUNTER — Other Ambulatory Visit: Payer: Self-pay

## 2015-05-26 ENCOUNTER — Non-Acute Institutional Stay (SKILLED_NURSING_FACILITY): Payer: Medicare Other | Admitting: Adult Health

## 2015-05-26 ENCOUNTER — Encounter: Payer: Self-pay | Admitting: Adult Health

## 2015-05-26 ENCOUNTER — Other Ambulatory Visit: Payer: Self-pay | Admitting: Adult Health

## 2015-05-26 DIAGNOSIS — E1122 Type 2 diabetes mellitus with diabetic chronic kidney disease: Secondary | ICD-10-CM | POA: Diagnosis not present

## 2015-05-26 DIAGNOSIS — I442 Atrioventricular block, complete: Secondary | ICD-10-CM | POA: Diagnosis not present

## 2015-05-26 DIAGNOSIS — E785 Hyperlipidemia, unspecified: Secondary | ICD-10-CM

## 2015-05-26 DIAGNOSIS — J309 Allergic rhinitis, unspecified: Secondary | ICD-10-CM

## 2015-05-26 DIAGNOSIS — N184 Chronic kidney disease, stage 4 (severe): Secondary | ICD-10-CM

## 2015-05-26 DIAGNOSIS — G4700 Insomnia, unspecified: Secondary | ICD-10-CM

## 2015-05-26 DIAGNOSIS — I2510 Atherosclerotic heart disease of native coronary artery without angina pectoris: Secondary | ICD-10-CM

## 2015-05-26 DIAGNOSIS — R5381 Other malaise: Secondary | ICD-10-CM

## 2015-05-26 DIAGNOSIS — I5042 Chronic combined systolic (congestive) and diastolic (congestive) heart failure: Secondary | ICD-10-CM | POA: Diagnosis not present

## 2015-05-26 DIAGNOSIS — N189 Chronic kidney disease, unspecified: Secondary | ICD-10-CM

## 2015-05-26 DIAGNOSIS — I739 Peripheral vascular disease, unspecified: Secondary | ICD-10-CM

## 2015-05-26 NOTE — Progress Notes (Addendum)
Patient ID: Dorothy Bartlett, female   DOB: Mar 07, 1926, 81 y.o.   MRN: [id number]   05/16/15  Facility:  Nursing Home Location:  [facility] Room Number: 901-2 LEVEL OF CARE:  SNF (31)   Chief Complaint  Patient presents with  . Hospitalization Follow-up  Physical deconditioning, complete heart block S/P pacemaker implantation, CAD, CHF, diabetes mellitus, allergic rhinitis, insomnia, CKD stage IV and PVD      HISTORY OF PRESENT ILLNESS:  This is an 81 year old female [facility] She has PMH of CAD S/P angioplasty, chronic combined systolic and diastolic heart failure, PAD with right toe amputation and S/P lower extremity revascularization surgery, hypertension, diabetes mellitus with ckd. She went to the hospital due to syncope and was noted to have complete heart block and acute on chronic kidney disease. She had pacemaker implantation on 5/10. She refused dialysis  She has been admitted for a short-term rehabilitation.  PAST MEDICAL HISTORY:  Past Medical History  Diagnosis Date  . Hyperlipidemia   . Carotid stenosis     Carotid US (12/2013): < 60% RCA, < 40% LICA.  [name]. CKD (chronic kidney disease), stage IV     notes 02/22/2014; Dr. Hyman HopesWebb  . Hypertension   . Coronary artery disease     a. s/p MI 1995 tx w/ PTCA to Dx, Ramus, ap LAD;  b. LHC (11/2004):  Inferior HK, EF 60%, proximal LAD 50%, ostial D1 80%, mid D1 95%, mid LAD 50-60%, distal LAD at the apex 90%, OM1 occluded (fills late by left to left collaterals), RCA 75%. Medical therapy recommended.;  c. Lexiscan Myoview (12/2013):  Prior ant-lat scar with very small area of peri-infarct ischemia in ant wall, EF 40 (high risk)   . Obesity (BMI 30-39.9)   . 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 Type 2 diabetes mellitus with renal manifestations   . Intrinsic asthma 01/28/2008     Mild intermittant asthma,  PFT's 01/28/08     . Old anterolateral myocardial infarction   . PVD (peripheral vascular disease) 02/16/2014    2015, right femoral peroneal bypass grafting and left femoral-tibial bypass grafting by Dr. Arbie CookeyEarly for rest pain as well as nonhealing ulcer on toe accompanied with toe amputation  Arteriogram October 2015 with patent graft on the right, 70% stenosis of graft on the left proximally.   . Chronic combined systolic and diastolic heart failure 04/13/2014  . Complete heart block     a. 05/2015 s/p [facility] Jude Medical Assurity DR model [id number] dual chamber PPM (serial number [id number]) (Dr. Johney FrameAllred).  [name]. Hx of echocardiogram     a. Echo 5/16: dist septal, dist ant, dist inf and apical AK, EF 40-45%, mod MR, mild LAE, PASP 72    CURRENT MEDICATIONS: Reviewed per MAR/see medication list  No Known Allergies   REVIEW OF SYSTEMS:  GENERAL: no change in appetite, no fatigue, no weight changes, no fever, chills or weakness RESPIRATORY: no cough, SOB, DOE, wheezing, hemoptysis CARDIAC: no chest pain, edema or palpitations GI: no abdominal pain, diarrhea, constipation, heart burn, nausea or vomiting  PHYSICAL EXAMINATION  GENERAL: no acute distress EYES: conjunctivae normal, sclerae normal, normal eye lids NECK: supple, trachea midline, no neck masses, no thyroid tenderness, no thyromegaly LYMPHATICS: no LAN in the neck, no supraclavicular LAN RESPIRATORY: breathing is even & unlabored, BS CTAB CARDIAC: RRR, no murmur,no extra heart sounds, no edema  GI: abdomen soft, normal BS, no masses, no tenderness, no hepatomegaly, no splenomegaly EXTREMITIES: able to move X 4 extremities PSYCHIATRIC: the patient is alert & oriented to person, affect & behavior appropriate  LABS/RADIOLOGY: Labs reviewed: Basic Metabolic Panel:  Recent Labs  16/10/96 0253 05/14/15 0328 05/15/15 0436  NA 140 140 139  K 3.9 3.7 3.8  CL 103 102 100*  CO2 GLUCOSE 86 85 89  BUN 107* 103* 91*  CREATININE  4.49* 3.98* 3.51*  CALCIUM 8.9 9.1 9.2  PHOS 6.2* 5.5* 4.6   Liver Function Tests:  Recent Labs  08/04/14 0440 05/08/15 1650 05/13/15 0253 05/14/15 0328 05/15/15 0436  AST 17 83*  --   --   --   ALT 13 84*  --   --   --   ALKPHOS 64 67  --   --   --   BILITOT 0.3 0.6  --   --   --   PROT 6.4 6.6  --   --   --   ALBUMIN 3.0* 3.6 3.1* 3.0* 3.1*   CBC:  Recent Labs  05/08/15 1314 05/08/15 1320 05/08/15 1650 05/09/15 0901  WBC 9.6  --  9.3 9.2  NEUTROABS 6.7  --   --   --   HGB 10.3* 11.6* 10.2* 9.2*  HCT 31.8* 34.0* 30.9* 29.1*  MCV 92.7  --  90.9 92.4  PLT 157  --  151 144*   Cardiac Enzymes:  Recent Labs  05/08/15 1650 05/08/15 2140 05/09/15 0341  TROPONINI 0.53* 0.48* 0.54*   CBG:  Recent Labs  05/14/15 2059 05/15/15 0729 05/15/15 1135  GLUCAP 164* 97 142*     Dg Chest 2 View  05/11/2015   CLINICAL DATA:  Shortness of breath.  EXAM: CHEST  2 VIEW  COMPARISON:  05/08/2015.  FINDINGS: Cardiomegaly with pulmonary venous congestion and new onset bilateral pulmonary interstitial prominence consistent with congestive heart failure. Tiny bilateral pleural effusions noted. Cardiac pacer noted with lead tips in right atrium and right ventricle. No pneumothorax.  IMPRESSION: Congestive heart failure with bilateral pulmonary interstitial edema and pleural effusions.   Electronically Signed   By: Maisie Fus  Register   On: 05/11/2015 08:05   Dg Chest Port 1 View  05/12/2015   CLINICAL DATA:  Pacemaker  EXAM: PORTABLE CHEST - 1 VIEW  COMPARISON:  05/11/2015  FINDINGS: Dual lead pacemaker in good position and unchanged  Cardiac enlargement. Progression of congestive heart failure with increased vascular congestion and edema. Enlarging right pleural effusion. Increased atelectasis in the lung bases.  IMPRESSION: Progression of congestive heart failure with edema. Enlarging right effusion and enlarging bibasilar atelectasis.   Electronically Signed   By: Marlan Palau M.D.    On: 05/12/2015 10:23   Dg Chest Port 1 View  05/08/2015   CLINICAL DATA:  AxO x4. Pt. Complaint of weakness x 2 days, dizziness starting today. Family reports that pt. Had syncopal episode this AM when going to the bathroom. HR 29. Hx CHF/DMH/o HTN, asthma, CHF, diabetes, cardiac cath in 1995  EXAM: PORTABLE CHEST - 1 VIEW  COMPARISON:  08/04/2014  FINDINGS: External leads overlie the patient. Stable cardiomegaly. Relatively low lung volumes with perihilar and bibasilar bronchovascular crowding and prominence, can't exclude central pulmonary vascular congestion. No confluent airspace consolidation. No effusion. Visualized skeletal structures are unremarkable. Atheromatous aorta.  IMPRESSION: 1. Stable cardiomegaly.  Low lung volumes.   Electronically Signed   By: Corlis Leak M.D.   On:  05/08/2015 13:22    ASSESSMENT/PLAN:  Physical deconditioning - for rehabilitation Complete heart block S/P pacemaker implantation - follow-up with cardiology; continue Norco 5/325 mg 1-2 tabs by mouth every 6 hours when necessary for pain CAD - stable; continue aspirin 81 mg by mouth daily, and Lipitor 40 mg by mouth daily and NTG when necessary Chronic combined systolic and diastolic heart failure - stable; continue Coreg 12.5 mg by mouth twice a day, Lasix 40 mg by mouth daily and iImdur 60 mg by mouth daily Diabetes mellitus, type II with chronic kidney disease - hemoglobin A1c 5.8; continue Januvia 25 mg PO Q D and Humalog 5 units SQ BID for CBG >250 Hyperlipidemia - continue Lipitor 40 mg by mouth daily Allergic rhinitis - continue Claritin 10 mg by mouth daily Insomnia - continue melatonin 3 mg by mouth daily at bedtime CKD stage IV - continue Zemplar 1 mcg by mouth daily and renavite 1 mg by mouth every morning PVD - S/P revascularization; stable    Goals of care:  Short-term rehabilitation   Labs/test ordered:  CBC and BMP in 1 week   Spent 50 minutes in patient care.   [facility],  NP [employer] [phone]

## 2015-05-26 NOTE — Progress Notes (Addendum)
Patient ID: Dorothy Bartlett, female   DOB: 11-01-1926, 81 y.o.   MRN: [id number]   05/26/15  Facility:  Nursing Home Location:  [facility] Room Number: 901-2 LEVEL OF CARE:  SNF (31)   Chief Complaint  Patient presents with  . Discharge Note  Physical deconditioning, complete heart block S/P pacemaker implantation, CAD, CHF, diabetes mellitus, allergic rhinitis, insomnia, CKD stage IV and PVD     HISTORY OF PRESENT ILLNESS:  This is an 81 year old female who is for discharge home with Home health PT for endurance and OT for ADLs. She has been admitted to [facility] on 05/15/15 from [facility]. She has PMH of CAD S/P angioplasty, chronic combined systolic and diastolic heart failure, PAD with right toe amputation and S/P lower extremity revascularization surgery, hypertension, diabetes mellitus with ckd. She went to the hospital due to syncope and was noted to have complete heart block and acute on chronic kidney disease. She had pacemaker implantation on 5/10. She refused dialysis  Patient was admitted to this facility for short-term rehabilitation after the patient's recent hospitalization.  Patient has completed SNF rehabilitation and therapy has cleared the patient for discharge.   PAST MEDICAL HISTORY:  Past Medical History  Diagnosis Date  . Hyperlipidemia   . Carotid stenosis     Carotid US (12/2013): < 60% RCA, < 40% LICA.  [name] CKD (chronic kidney disease), stage IV     notes 02/22/2014; Dr. Hyman Hopes  . Hypertension   . Coronary artery disease     a. s/p MI 1995 tx w/ PTCA to Dx, Ramus, ap LAD;  b. LHC (11/2004):  Inferior HK, EF 60%, proximal LAD 50%, ostial D1 80%, mid D1 95%, mid LAD 50-60%, distal LAD at the apex 90%, OM1 occluded (fills late by left to left collaterals), RCA 75%. Medical therapy recommended.;  c. Lexiscan Myoview (12/2013):  Prior ant-lat scar with very small area of peri-infarct ischemia in ant wall, EF 40 (high risk)   .  Obesity (BMI 30-39.9)   . 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 Type 2 diabetes mellitus with renal manifestations   . Intrinsic asthma 01/28/2008     Mild intermittant asthma, PFT's 01/28/08     . Old anterolateral myocardial infarction   . PVD (peripheral vascular disease) 02/16/2014    2015, right femoral peroneal bypass grafting and left femoral-tibial bypass grafting by Dr. Arbie Cookey for rest pain as well as nonhealing ulcer on toe accompanied with toe amputation  Arteriogram October 2015 with patent graft on the right, 70% stenosis of graft on the left proximally.   . Chronic combined systolic and diastolic heart failure 04/13/2014  . Complete heart block     a. 05/2015 s/p [facility] Assurity DR model [id number] dual chamber PPM (serial number [id number]) (Dr. Johney Frame).  [name] Hx of echocardiogram     a. Echo 5/16: dist septal, dist ant, dist inf and apical AK, EF 40-45%, mod MR, mild LAE, PASP 72    CURRENT MEDICATIONS: Reviewed per MAR/see medication list  No Known Allergies   REVIEW OF SYSTEMS:  GENERAL: no change in appetite, no fatigue, no weight changes, no fever, chills or weakness RESPIRATORY: no cough, SOB, DOE, wheezing, hemoptysis CARDIAC: no chest pain, edema or palpitations GI: no abdominal pain, diarrhea, constipation, heart burn, nausea or vomiting  PHYSICAL EXAMINATION  GENERAL: no acute distress SKIN:  Left chest pacemaker site is dry, no redness  NECK: supple, trachea midline, no neck masses, no thyroid tenderness, no thyromegaly LYMPHATICS: no LAN in the neck, no supraclavicular LAN RESPIRATORY: breathing is even & unlabored, BS CTAB CARDIAC: RRR, no murmur,no extra heart sounds,  GI: abdomen soft, normal BS, no masses, no tenderness, no hepatomegaly, no splenomegaly EXTREMITIES: able to move X 4 extremities PSYCHIATRIC: the patient is alert & oriented to person, affect & behavior appropriate  LABS/RADIOLOGY: Labs  reviewed: 05/24/15  WBC 7.4 hemoglobin 9.8 hematocrit 30.0 MCV 89.3 sodium 139 potassium 3.9 glucose 98 BUN 56 creatinine 3.17 calcium 9.1 Basic Metabolic Panel:  Recent Labs  09/81/1905/13/16 0253 05/14/15 0328 05/15/15 0436  NA 140 140 139  K 3.9 3.7 3.8  CL 103 102 100*  CO2 23 28 27   GLUCOSE 86 85 89  BUN 107* 103* 91*  CREATININE 4.49* 3.98* 3.51*  CALCIUM 8.9 9.1 9.2  PHOS 6.2* 5.5* 4.6   Liver Function Tests:  Recent Labs  08/04/14 0440 05/08/15 1650 05/13/15 0253 05/14/15 0328 05/15/15 0436  AST 17 83*  --   --   --   ALT 13 84*  --   --   --   ALKPHOS 64 67  --   --   --   BILITOT 0.3 0.6  --   --   --   PROT 6.4 6.6  --   --   --   ALBUMIN 3.0* 3.6 3.1* 3.0* 3.1*   CBC:  Recent Labs  05/08/15 1314 05/08/15 1320 05/08/15 1650 05/09/15 0901  WBC 9.6  --  9.3 9.2  NEUTROABS 6.7  --   --   --   HGB 10.3* 11.6* 10.2* 9.2*  HCT 31.8* 34.0* 30.9* 29.1*  MCV 92.7  --  90.9 92.4  PLT 157  --  151 144*   Cardiac Enzymes:  Recent Labs  05/08/15 1650 05/08/15 2140 05/09/15 0341  TROPONINI 0.53* 0.48* 0.54*   CBG:  Recent Labs  05/14/15 2059 05/15/15 0729 05/15/15 1135  GLUCAP 164* 97 142*     Dg Chest 2 View  05/11/2015   CLINICAL DATA:  Shortness of breath.  EXAM: CHEST  2 VIEW  COMPARISON:  05/08/2015.  FINDINGS: Cardiomegaly with pulmonary venous congestion and new onset bilateral pulmonary interstitial prominence consistent with congestive heart failure. Tiny bilateral pleural effusions noted. Cardiac pacer noted with lead tips in right atrium and right ventricle. No pneumothorax.  IMPRESSION: Congestive heart failure with bilateral pulmonary interstitial edema and pleural effusions.   Electronically Signed   By: Maisie Fushomas  Register   On: 05/11/2015 08:05   Dg Chest Port 1 View  05/12/2015   CLINICAL DATA:  Pacemaker  EXAM: PORTABLE CHEST - 1 VIEW  COMPARISON:  05/11/2015  FINDINGS: Dual lead pacemaker in good position and unchanged  Cardiac  enlargement. Progression of congestive heart failure with increased vascular congestion and edema. Enlarging right pleural effusion. Increased atelectasis in the lung bases.  IMPRESSION: Progression of congestive heart failure with edema. Enlarging right effusion and enlarging bibasilar atelectasis.   Electronically Signed   By: Marlan Palauharles  Clark M.D.   On: 05/12/2015 10:23   Dg Chest Port 1 View  05/08/2015   CLINICAL DATA:  AxO x4. Pt. Complaint of weakness x 2 days, dizziness starting today. Family reports that pt. Had syncopal episode this AM when going to the bathroom. HR 29. Hx CHF/DMH/o HTN, asthma, CHF, diabetes, cardiac cath in 1995  EXAM: PORTABLE CHEST - 1 VIEW  COMPARISON:  08/04/2014  FINDINGS: External leads  overlie the patient. Stable cardiomegaly. Relatively low lung volumes with perihilar and bibasilar bronchovascular crowding and prominence, can't exclude central pulmonary vascular congestion. No confluent airspace consolidation. No effusion. Visualized skeletal structures are unremarkable. Atheromatous aorta.  IMPRESSION: 1. Stable cardiomegaly.  Low lung volumes.   Electronically Signed   By: Corlis Leak M.D.   On: 05/08/2015 13:22    ASSESSMENT/PLAN:  Physical deconditioning - for home health PT and OT Complete heart block S/P pacemaker implantation - follow-up with cardiology; Norco was recently discontinued due to non-use CAD - stable; continue aspirin 81 mg by mouth daily, and Lipitor 40 mg by mouth daily and NTG when necessary Chronic combined systolic and diastolic heart failure - stable; continue Coreg 12.5 mg by mouth twice a day, Lasix 40 mg by mouth daily and iImdur 60 mg by mouth daily Diabetes mellitus, type II with chronic kidney disease - hemoglobin A1c 5.8; continue Januvia  by mouth Q D and Humalog SQ BID for CBG >250 Hyperlipidemia - continue Lipitor 40 mg by mouth daily Allergic rhinitis - continue Claritin 10 mg by mouth daily Insomnia - continue melatonin 3 mg by  mouth daily at bedtime CKD stage IV - continue Zemplar 1 mcg by mouth daily and renavite 1 mg by mouth every morning PVD - S/P revascularization; stable    I have filled out patient's discharge paperwork and written prescriptions.  Patient will receive home health PT and OT.  Total discharge time: Less than 30 minutes  Discharge time involved coordination of the discharge process with Child psychotherapist, nursing staff and therapy department. Medical justification for home health services verified.    [facility], NP [employer] [phone]

## 2015-05-27 ENCOUNTER — Telehealth: Payer: Self-pay | Admitting: Interventional Cardiology

## 2015-05-27 NOTE — Telephone Encounter (Signed)
[facility] and verified that this is okay.

## 2015-05-27 NOTE — Telephone Encounter (Signed)
New Prob   Calling to notify our office that PT/OT can not begin until Wednesday 6/1 due to holiday weekend. Calling to verify this is OK.

## 2015-05-31 ENCOUNTER — Encounter: Payer: Self-pay | Admitting: Internal Medicine

## 2015-06-01 ENCOUNTER — Ambulatory Visit (INDEPENDENT_AMBULATORY_CARE_PROVIDER_SITE_OTHER): Payer: Medicare Other | Admitting: Internal Medicine

## 2015-06-01 ENCOUNTER — Encounter: Payer: Self-pay | Admitting: *Deleted

## 2015-06-01 ENCOUNTER — Encounter: Payer: Self-pay | Admitting: Internal Medicine

## 2015-06-01 VITALS — BP 130/70 | HR 78 | Ht 59.0 in | Wt 174.0 lb

## 2015-06-01 DIAGNOSIS — T82110D Breakdown (mechanical) of cardiac electrode, subsequent encounter: Secondary | ICD-10-CM

## 2015-06-01 DIAGNOSIS — I442 Atrioventricular block, complete: Secondary | ICD-10-CM | POA: Diagnosis not present

## 2015-06-01 LAB — CUP PACEART INCLINIC DEVICE CHECK
Battery Remaining Longevity: 26.4 mo
Brady Statistic RA Percent Paced: 32 %
Brady Statistic RV Percent Paced: 99.72 %
Date Time Interrogation Session: 20160601152713
Lead Channel Impedance Value: 350 Ohm
Lead Channel Pacing Threshold Amplitude: 0.625 V
Lead Channel Pacing Threshold Amplitude: 2.75 V
Lead Channel Pacing Threshold Amplitude: 4.25 V
Lead Channel Pacing Threshold Pulse Width: 1.5 ms
Lead Channel Sensing Intrinsic Amplitude: 1.7 mV
Lead Channel Setting Pacing Amplitude: 1.625
Lead Channel Setting Pacing Amplitude: 5 V
Lead Channel Setting Sensing Sensitivity: 4 mV
MDC IDC MSMT BATTERY VOLTAGE: 2.95 V
MDC IDC MSMT LEADCHNL RA IMPEDANCE VALUE: 450 Ohm
MDC IDC MSMT LEADCHNL RA PACING THRESHOLD PULSEWIDTH: 0.4 ms
MDC IDC MSMT LEADCHNL RV PACING THRESHOLD PULSEWIDTH: 1.5 ms
MDC IDC PG SERIAL: 7759868
MDC IDC SET LEADCHNL RV PACING PULSEWIDTH: 1.5 ms
Pulse Gen Model: 2240

## 2015-06-01 NOTE — Patient Instructions (Signed)
Medication Instructions:  Your physician recommends that you continue on your current medications as directed. Please refer to the Current Medication list given to you today.   Labwork: None ordered   Testing/Procedures:  RV lead revision on 06/21/15  Follow-Up: Your physician recommends that you schedule a follow-up appointment in: 10-14 days from 06/21/15 in device clinic for wound check   Any Other Special Instructions Will Be Listed Below (If Applicable).

## 2015-06-03 ENCOUNTER — Ambulatory Visit (INDEPENDENT_AMBULATORY_CARE_PROVIDER_SITE_OTHER): Payer: Medicare Other | Admitting: Podiatry

## 2015-06-03 ENCOUNTER — Encounter: Payer: Self-pay | Admitting: Podiatry

## 2015-06-03 VITALS — BP 142/67 | HR 77

## 2015-06-03 DIAGNOSIS — B351 Tinea unguium: Secondary | ICD-10-CM | POA: Insufficient documentation

## 2015-06-03 DIAGNOSIS — M79606 Pain in leg, unspecified: Secondary | ICD-10-CM | POA: Diagnosis not present

## 2015-06-03 NOTE — Progress Notes (Signed)
Electrophysiology Office Note   Date:  06/03/2015   ID:  Dorothy Bartlett, DOB 24-Nov-1926, MRN [id number]  PCP:  Eliott Nine, MD  Cardiologist:  Dr Katrinka Blazing Primary Electrophysiologist: Hillis Range, MD    Chief Complaint  Patient presents with  . Complete heart block     History of Present Illness: Dorothy Bartlett is a 81 y.o. female who presents today for electrophysiology evaluation.   She continues to make great improvement s/p recent PPM.  Her energy has significantly improved.  She is a new person.  She has healed uneventfully. Unfortunately, her RV pacing threshold remains very elevated, requiring high pacing output.  Today, she denies symptoms of palpitations, chest pain, shortness of breath, orthopnea, PND,  claudication, dizziness, presyncope, syncope, bleeding, or neurologic sequela. The patient is tolerating medications without difficulties and is otherwise without complaint today.    Past Medical History  Diagnosis Date  . Hyperlipidemia   . Carotid stenosis     Carotid US (12/2013): < 60% RCA, < 40% LICA.  [name] CKD (chronic kidney disease), stage IV     notes 02/22/2014; Dr. Hyman Hopes  . Hypertension   . Coronary artery disease     a. s/p MI 1995 tx w/ PTCA to Dx, Ramus, ap LAD;  b. LHC (11/2004):  Inferior HK, EF 60%, proximal LAD 50%, ostial D1 80%, mid D1 95%, mid LAD 50-60%, distal LAD at the apex 90%, OM1 occluded (fills late by left to left collaterals), RCA 75%. Medical therapy recommended.;  c. Lexiscan Myoview (12/2013):  Prior ant-lat scar with very small area of peri-infarct ischemia in ant wall, EF 40 (high risk)   . Obesity (BMI 30-39.9)   . 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 Type 2 diabetes mellitus with renal manifestations   . Intrinsic asthma 01/28/2008     Mild intermittant asthma, PFT's 01/28/08     . Old anterolateral myocardial infarction   . PVD (peripheral vascular disease) 02/16/2014    2015, right  femoral peroneal bypass grafting and left femoral-tibial bypass grafting by Dr. Arbie Cookey for rest pain as well as nonhealing ulcer on toe accompanied with toe amputation  Arteriogram October 2015 with patent graft on the right, 70% stenosis of graft on the left proximally.   . Chronic combined systolic and diastolic heart failure 04/13/2014  . Complete heart block     a. 05/2015 s/p [facility] Assurity DR model [id number] dual chamber PPM (serial number [id number]) (Dr. Johney Frame).  [name] Hx of echocardiogram     a. Echo 5/16: dist septal, dist ant, dist inf and apical AK, EF 40-45%, mod MR, mild LAE, PASP 72   Past Surgical History  Procedure Laterality Date  . Repair of nerve in left arm  Left 2000  . Cardiac catheterization  1995    w/ PTCA Diag 2nd MI  . Femoral-tibial bypass graft Left 02/24/2014    Procedure: BYPASS GRAFT FEMORAL-TIBIAL ARTERY;  Surgeon: Larina Earthly, MD;  Location: [facility] OR;  Service: Vascular;  Laterality: Left;  . Femoral-popliteal bypass graft Right 08/04/2014    Procedure: RIGHT FEMORAL-PERONEAL ARTERY BYPASS GRAFT;  Surgeon: Larina Earthly, MD;  Location: [facility] OR;  Service: Vascular;  Laterality: Right;  . Amputation Right 08/04/2014    Procedure: AMPUTATION DIGIT-RIGHT 3RD TOE;  Surgeon: Larina Earthly, MD;  Location: [facility] OR;  Service: Vascular;  Laterality: Right;  . Lower extremity angiogram Left 01/08/2014    Procedure: LOWER EXTREMITY ANGIOGRAM;  Surgeon: Sherren Kerns, MD;  Location[facility] CATH LAB;  Service: Cardiovascular;  Laterality: Left;  . Abdominal angiogram  01/08/2014    Procedure: ABDOMINAL ANGIOGRAM;  Surgeon: Sherren Kerns, MD;  Location: [facility] CATH LAB;  Service: Cardiovascular;;  . Lower extremity angiogram Right 08/03/2014    Procedure: LOWER EXTREMITY ANGIOGRAM;  Surgeon: Nada Libman, MD;  Location: [facility] CATH LAB;  Service: Cardiovascular;  Laterality: Right;  . Abdominal aortagram N/A 10/25/2014    Procedure: ABDOMINAL Ronny Flurry;  Surgeon: Chuck Hint, MD;   Location: [facility] CATH LAB;  Service: Cardiovascular;  Laterality: N/A;  . Ep implantable device N/A 05/10/2015    Procedure: Pacemaker Implant;  Surgeon: Hillis Range, MD;  Location: [facility] INVASIVE CV LAB;  Service: Cardiovascular;  Laterality: N/A;     Current Outpatient Prescriptions  Medication Sig Dispense Refill  . albuterol (PROVENTIL HFA;VENTOLIN HFA) 108 (90 BASE) MCG/ACT inhaler Inhale 2 puffs into the lungs every 6 (six) hours as needed for wheezing or shortness of breath.    [name] aspirin 81 MG tablet Take 81 mg by mouth every morning.     [name] atorvastatin (LIPITOR) 40 MG tablet Take 40 mg by mouth every morning.     . B Complex-C-Folic Acid (RENA-VITE RX) 1 MG TABS Take 1 tablet by mouth every morning.     . carvedilol (COREG) 12.5 MG tablet Take 1 tablet (12.5 mg total) by mouth 2 (two) times daily. 180 tablet 1  . docusate sodium (COLACE) 100 MG capsule Take 100 mg by mouth 2 (two) times daily.     . furosemide (LASIX) 40 MG tablet Take 1 tablet (40 mg total) by mouth daily.    . hydrALAZINE (APRESOLINE) 10 MG tablet Take 1 tablet by mouth 2 (two) times daily.    [name] HYDROcodone-acetaminophen (NORCO/VICODIN) 5-325 MG per tablet Take 1-2 tablets by mouth every 6 (six) hours as needed for severe pain. 30 tablet 0  . insulin lispro (HUMALOG) 100 UNIT/ML injection Inject 5 Units into the skin 2 (two) times daily as needed for high blood sugar. Only takes if CBG >250.    [name] isosorbide mononitrate (IMDUR) 60 MG 24 hr tablet Take 1 tablet (60 mg total) by mouth daily.    [name] loratadine (CLARITIN) 10 MG tablet Take 10 mg by mouth daily as needed for allergies.    . Melatonin 3 MG TABS Take 3 mg by mouth at bedtime as needed (for sleep).     . nitroGLYCERIN (NITROSTAT) 0.4 MG SL tablet Place 1 tablet (0.4 mg total) under the tongue every 5 (five) minutes x 3 doses as needed for chest pain.  12  . paricalcitol (ZEMPLAR) 1 MCG capsule Take 1 mcg by mouth daily.     . sitaGLIPtin (JANUVIA) 25 MG tablet Take 25  mg by mouth daily.     No current facility-administered medications for this visit.    Allergies:   Review of patient's allergies indicates no known allergies.   Social History:  The patient  reports that she has never smoked. She has never used smokeless tobacco. She reports that she does not drink alcohol or use illicit drugs.   Family History:  The patient's family history includes Diabetes in her father; Hyperlipidemia in her daughter and son; Hypertension in her daughter, father, and sister.    ROS:  Please see the history of present illness.   All other systems are reviewed and negative.    PHYSICAL EXAM: VS:  BP 130/70 mmHg  Pulse 78  Ht 4\' 11"  (1.499 m)  Wt 78.926 kg (174 lb)  BMI 35.13 kg/m2 , BMI Body mass index is 35.13 kg/(m^2). GEN: overweight, edlerly, in no acute distress HEENT: normal Neck: no JVD, carotid bruits, or masses Cardiac: RRR (paced) Respiratory:  clear to auscultation bilaterally, normal work of breathing GI: soft, nontender, nondistended, + BS MS: no deformity or atrophy Skin: warm and dry, device pocket is well healed Neuro:  Strength and sensation are intact Psych: euthymic mood, full affect  Device interrogation is reviewed today in detail.  See PaceArt for details.   Recent Labs: 05/08/2015: ALT 84*; TSH 2.772 05/09/2015: Hemoglobin 9.2*; Platelets 144* 05/15/2015: BUN 91*; Creatinine 3.51*; Potassium 3.8; Sodium 139    Lipid Panel  No results found for: CHOL, TRIG, HDL, CHOLHDL, VLDL, LDLCALC, LDLDIRECT   Wt Readings from Last 3 Encounters:  06/01/15 78.926 kg (174 lb)  05/26/15 77.928 kg (171 lb 12.8 oz)  05/16/15 82.555 kg (182 lb)    ASSESSMENT AND PLAN:  1.  Complete heart block Device dependant but unfortunately her RV lead pacing threshold is very elevating.  She has intermittent capture at 4V @1 .5 msec but does not fail to capture at 5V@1 .5msec.  Clinically, she has had no symptoms of loss of capture and continues to clinically  improve.  I had a long discussion with patient and her daughter today regarding her RV lead threshold elevation.  We discussed options of watchful waiting/ conservative management vs lead revision. Risks, benefits, alternatives to pacemaker lead revision were discussed in detail with the patient today. The patient understands that the risks include but are not limited to bleeding, infection, pneumothorax, perforation, tamponade, vascular damage, renal failure, MI, stroke, death,  and lead dislodgement and wishes to proceed. We will therefore schedule the procedure at the next available time.  Signed, Hillis RangeJames Mccauley Diehl, MD    [facility] [address] [place] [address] [phone] (office) [phone] (fax)

## 2015-06-03 NOTE — Patient Instructions (Signed)
Seen for hypertrophic nails. All nails debrided. Return in 3 months or as needed.  

## 2015-06-03 NOTE — Progress Notes (Signed)
Subjective:  81 year old female presents accompanied by her daughter, requesting toe nails trimmed. Got a Visual merchandiserpace maker in May 10.   Objective: Vascular: Pedal pulses are not palpable. Excess foot and leg edema bilateral. Old surgical incision line on both lower limb. Dermatologic: Discolored and hypertrophic nails x 9. Neurologic: All epicritic sensations grossly intact with hyperalgic foot left.  Orthopedic: Status post removal of 3rd digit right. Severe STJ pronation with flattening arch upon weight bearing.   Assessment:  Status post vascular reconstruction bilateral lower limb.  Pedal edema bilateral. STJ hyper pronation with flattening arch upon weight bearn.  Deformed mycotic nails x 9.  Plan:  All nails debrided. Return in 3 month or as needed.

## 2015-06-06 ENCOUNTER — Telehealth: Payer: Self-pay | Admitting: Internal Medicine

## 2015-06-06 NOTE — Telephone Encounter (Signed)
New message      Pt has a procedure scheduled for the 21st.  Pt also has PT and OT twice a week with [facility] which makes her tired and weak.  Should pt stop PT and OT until after procedure?

## 2015-06-06 NOTE — Telephone Encounter (Signed)
Follow Up        Pt's daughter calling back because she has yet to receive a call and they need to know if they need to cancel pt's physical therapy for tomorrow morning. Please call back and advise.

## 2015-06-06 NOTE — Telephone Encounter (Signed)
Called and let daughter know that Dr Katrinka BlazingSmith must have ordered as Dr Johney FrameAllred did not.  It is okay for her to do PT/OT.  Just not lifting over 10 pounds for 6 weeks post implant but she should be okay.  Discussed with Dr Johney FrameAllred and he feels its good for her to continue

## 2015-06-07 ENCOUNTER — Other Ambulatory Visit (HOSPITAL_COMMUNITY): Payer: Self-pay | Admitting: *Deleted

## 2015-06-08 ENCOUNTER — Encounter (HOSPITAL_COMMUNITY)
Admission: RE | Admit: 2015-06-08 | Discharge: 2015-06-08 | Disposition: A | Discharge status: Home / Self Care | Payer: Medicare Other | Source: Ambulatory Visit | Attending: Nephrology | Admitting: Nephrology

## 2015-06-08 DIAGNOSIS — D631 Anemia in chronic kidney disease: Secondary | ICD-10-CM | POA: Diagnosis not present

## 2015-06-08 DIAGNOSIS — N183 Chronic kidney disease, stage 3 (moderate): Secondary | ICD-10-CM | POA: Diagnosis not present

## 2015-06-08 MED ORDER — DARBEPOETIN ALFA 40 MCG/0.4ML IJ SOSY
40.0000 ug | PREFILLED_SYRINGE | INTRAMUSCULAR | Status: DC
  Administered 2015-06-08: 40 ug via SUBCUTANEOUS

## 2015-06-08 MED ORDER — DARBEPOETIN ALFA 40 MCG/0.4ML IJ SOSY
PREFILLED_SYRINGE | INTRAMUSCULAR | Status: AC
  Filled 2015-06-08: qty 0.4

## 2015-06-11 ENCOUNTER — Inpatient Hospital Stay (HOSPITAL_COMMUNITY)
Admission: EM | Admit: 2015-06-11 | Discharge: 2015-06-15 | DRG: 292 | Disposition: A | Discharge status: Home Health | Payer: Medicare Other | Attending: Internal Medicine | Admitting: Internal Medicine

## 2015-06-11 ENCOUNTER — Emergency Department (HOSPITAL_COMMUNITY): Payer: Medicare Other

## 2015-06-11 ENCOUNTER — Encounter (HOSPITAL_COMMUNITY): Payer: Self-pay | Admitting: *Deleted

## 2015-06-11 DIAGNOSIS — Z955 Presence of coronary angioplasty implant and graft: Secondary | ICD-10-CM

## 2015-06-11 DIAGNOSIS — G4700 Insomnia, unspecified: Secondary | ICD-10-CM | POA: Diagnosis present

## 2015-06-11 DIAGNOSIS — I2510 Atherosclerotic heart disease of native coronary artery without angina pectoris: Secondary | ICD-10-CM

## 2015-06-11 DIAGNOSIS — T82110A Breakdown (mechanical) of cardiac electrode, initial encounter: Secondary | ICD-10-CM | POA: Diagnosis present

## 2015-06-11 DIAGNOSIS — D631 Anemia in chronic kidney disease: Secondary | ICD-10-CM | POA: Diagnosis present

## 2015-06-11 DIAGNOSIS — E785 Hyperlipidemia, unspecified: Secondary | ICD-10-CM | POA: Diagnosis present

## 2015-06-11 DIAGNOSIS — Z9861 Coronary angioplasty status: Secondary | ICD-10-CM

## 2015-06-11 DIAGNOSIS — E669 Obesity, unspecified: Secondary | ICD-10-CM | POA: Diagnosis present

## 2015-06-11 DIAGNOSIS — Z950 Presence of cardiac pacemaker: Secondary | ICD-10-CM | POA: Diagnosis not present

## 2015-06-11 DIAGNOSIS — Z87891 Personal history of nicotine dependence: Secondary | ICD-10-CM | POA: Diagnosis not present

## 2015-06-11 DIAGNOSIS — R0600 Dyspnea, unspecified: Secondary | ICD-10-CM | POA: Diagnosis present

## 2015-06-11 DIAGNOSIS — I442 Atrioventricular block, complete: Secondary | ICD-10-CM | POA: Diagnosis not present

## 2015-06-11 DIAGNOSIS — I272 Other secondary pulmonary hypertension: Secondary | ICD-10-CM | POA: Diagnosis present

## 2015-06-11 DIAGNOSIS — E1122 Type 2 diabetes mellitus with diabetic chronic kidney disease: Secondary | ICD-10-CM | POA: Diagnosis present

## 2015-06-11 DIAGNOSIS — I132 Hypertensive heart and chronic kidney disease with heart failure and with stage 5 chronic kidney disease, or end stage renal disease: Secondary | ICD-10-CM | POA: Diagnosis present

## 2015-06-11 DIAGNOSIS — J45909 Unspecified asthma, uncomplicated: Secondary | ICD-10-CM | POA: Diagnosis present

## 2015-06-11 DIAGNOSIS — Z79899 Other long term (current) drug therapy: Secondary | ICD-10-CM

## 2015-06-11 DIAGNOSIS — I252 Old myocardial infarction: Secondary | ICD-10-CM | POA: Diagnosis not present

## 2015-06-11 DIAGNOSIS — N039 Chronic nephritic syndrome with unspecified morphologic changes: Secondary | ICD-10-CM

## 2015-06-11 DIAGNOSIS — Z515 Encounter for palliative care: Secondary | ICD-10-CM | POA: Diagnosis not present

## 2015-06-11 DIAGNOSIS — J4520 Mild intermittent asthma, uncomplicated: Secondary | ICD-10-CM | POA: Diagnosis present

## 2015-06-11 DIAGNOSIS — E559 Vitamin D deficiency, unspecified: Secondary | ICD-10-CM

## 2015-06-11 DIAGNOSIS — E1129 Type 2 diabetes mellitus with other diabetic kidney complication: Secondary | ICD-10-CM | POA: Diagnosis present

## 2015-06-11 DIAGNOSIS — I739 Peripheral vascular disease, unspecified: Secondary | ICD-10-CM | POA: Diagnosis present

## 2015-06-11 DIAGNOSIS — Z794 Long term (current) use of insulin: Secondary | ICD-10-CM

## 2015-06-11 DIAGNOSIS — Z6835 Body mass index (BMI) 35.0-35.9, adult: Secondary | ICD-10-CM | POA: Diagnosis not present

## 2015-06-11 DIAGNOSIS — I340 Nonrheumatic mitral (valve) insufficiency: Secondary | ICD-10-CM | POA: Diagnosis present

## 2015-06-11 DIAGNOSIS — N2581 Secondary hyperparathyroidism of renal origin: Secondary | ICD-10-CM | POA: Diagnosis present

## 2015-06-11 DIAGNOSIS — Z7982 Long term (current) use of aspirin: Secondary | ICD-10-CM | POA: Diagnosis not present

## 2015-06-11 DIAGNOSIS — Y831 Surgical operation with implant of artificial internal device as the cause of abnormal reaction of the patient, or of later complication, without mention of misadventure at the time of the procedure: Secondary | ICD-10-CM | POA: Diagnosis present

## 2015-06-11 DIAGNOSIS — Z89421 Acquired absence of other right toe(s): Secondary | ICD-10-CM | POA: Diagnosis not present

## 2015-06-11 DIAGNOSIS — I429 Cardiomyopathy, unspecified: Secondary | ICD-10-CM | POA: Diagnosis present

## 2015-06-11 DIAGNOSIS — I119 Hypertensive heart disease without heart failure: Secondary | ICD-10-CM | POA: Diagnosis present

## 2015-06-11 DIAGNOSIS — I6523 Occlusion and stenosis of bilateral carotid arteries: Secondary | ICD-10-CM | POA: Diagnosis present

## 2015-06-11 DIAGNOSIS — I509 Heart failure, unspecified: Secondary | ICD-10-CM

## 2015-06-11 DIAGNOSIS — I5042 Chronic combined systolic (congestive) and diastolic (congestive) heart failure: Secondary | ICD-10-CM | POA: Diagnosis present

## 2015-06-11 DIAGNOSIS — I5043 Acute on chronic combined systolic (congestive) and diastolic (congestive) heart failure: Principal | ICD-10-CM | POA: Diagnosis present

## 2015-06-11 DIAGNOSIS — N185 Chronic kidney disease, stage 5: Secondary | ICD-10-CM | POA: Diagnosis present

## 2015-06-11 DIAGNOSIS — I110 Hypertensive heart disease with heart failure: Secondary | ICD-10-CM | POA: Diagnosis not present

## 2015-06-11 LAB — CBC
HEMATOCRIT: 33.3 % — AB (ref 36.0–46.0)
Hemoglobin: 10.4 g/dL — ABNORMAL LOW (ref 12.0–15.0)
MCH: 29.5 pg (ref 26.0–34.0)
MCHC: 31.2 g/dL (ref 30.0–36.0)
MCV: 94.3 fL (ref 78.0–100.0)
Platelets: 190 10*3/uL (ref 150–400)
RBC: 3.53 MIL/uL — AB (ref 3.87–5.11)
RDW: 14.2 % (ref 11.5–15.5)
WBC: 9.2 10*3/uL (ref 4.0–10.5)

## 2015-06-11 LAB — GLUCOSE, CAPILLARY
GLUCOSE-CAPILLARY: 133 mg/dL — AB (ref 65–99)
Glucose-Capillary: 141 mg/dL — ABNORMAL HIGH (ref 65–99)

## 2015-06-11 LAB — URINALYSIS, ROUTINE W REFLEX MICROSCOPIC
BILIRUBIN URINE: NEGATIVE
GLUCOSE, UA: NEGATIVE mg/dL
Hgb urine dipstick: NEGATIVE
Ketones, ur: NEGATIVE mg/dL
LEUKOCYTES UA: NEGATIVE
Nitrite: NEGATIVE
PH: 5 (ref 5.0–8.0)
PROTEIN: NEGATIVE mg/dL
Specific Gravity, Urine: 1.009 (ref 1.005–1.030)
Urobilinogen, UA: 0.2 mg/dL (ref 0.0–1.0)

## 2015-06-11 LAB — COMPREHENSIVE METABOLIC PANEL
ALT: 47 U/L (ref 14–54)
ANION GAP: 13 (ref 5–15)
AST: 51 U/L — AB (ref 15–41)
Albumin: 3.8 g/dL (ref 3.5–5.0)
Alkaline Phosphatase: 78 U/L (ref 38–126)
BUN: 61 mg/dL — ABNORMAL HIGH (ref 6–20)
CO2: 23 mmol/L (ref 22–32)
CREATININE: 3.67 mg/dL — AB (ref 0.44–1.00)
Calcium: 9.7 mg/dL (ref 8.9–10.3)
Chloride: 106 mmol/L (ref 101–111)
GFR, EST AFRICAN AMERICAN: 12 mL/min — AB (ref >60)
GFR, EST NON AFRICAN AMERICAN: 10 mL/min — AB (ref >60)
Glucose, Bld: 121 mg/dL — ABNORMAL HIGH (ref 65–99)
POTASSIUM: 4.6 mmol/L (ref 3.5–5.1)
Sodium: 142 mmol/L (ref 135–145)
Total Bilirubin: 0.8 mg/dL (ref 0.3–1.2)
Total Protein: 7.2 g/dL (ref 6.5–8.1)

## 2015-06-11 LAB — BRAIN NATRIURETIC PEPTIDE: B Natriuretic Peptide: 2283.5 pg/mL — ABNORMAL HIGH (ref 0.0–100.0)

## 2015-06-11 LAB — I-STAT TROPONIN, ED: TROPONIN I, POC: 0.09 ng/mL — AB (ref 0.00–0.08)

## 2015-06-11 LAB — MRSA PCR SCREENING: MRSA BY PCR: NEGATIVE

## 2015-06-11 MED ORDER — ONDANSETRON HCL 4 MG PO TABS: 4.0000 mg | ORAL_TABLET | Freq: Four times a day (QID) | ORAL | Status: DC | PRN

## 2015-06-11 MED ORDER — ASPIRIN EC 81 MG PO TBEC
81.0000 mg | DELAYED_RELEASE_TABLET | Freq: Every morning | ORAL | Status: DC
  Administered 2015-06-12 – 2015-06-15 (×4): 81 mg via ORAL
  Filled 2015-06-11 (×4): qty 1

## 2015-06-11 MED ORDER — LORATADINE 10 MG PO TABS
10.0000 mg | ORAL_TABLET | Freq: Every day | ORAL | Status: DC | PRN
  Filled 2015-06-11: qty 1

## 2015-06-11 MED ORDER — LINAGLIPTIN 5 MG PO TABS
5.0000 mg | ORAL_TABLET | Freq: Every day | ORAL | Status: DC
  Administered 2015-06-12 – 2015-06-15 (×4): 5 mg via ORAL
  Filled 2015-06-11 (×4): qty 1

## 2015-06-11 MED ORDER — FUROSEMIDE 10 MG/ML IJ SOLN: 40.0000 mg | Freq: Once | INTRAMUSCULAR | Status: DC

## 2015-06-11 MED ORDER — ALBUTEROL SULFATE HFA 108 (90 BASE) MCG/ACT IN AERS: 2.0000 | INHALATION_SPRAY | Freq: Four times a day (QID) | RESPIRATORY_TRACT | Status: DC | PRN

## 2015-06-11 MED ORDER — RAMELTEON 8 MG PO TABS
8.0000 mg | ORAL_TABLET | Freq: Every day | ORAL | Status: DC | PRN
  Filled 2015-06-11: qty 1

## 2015-06-11 MED ORDER — ISOSORBIDE MONONITRATE ER 60 MG PO TB24
60.0000 mg | ORAL_TABLET | Freq: Every day | ORAL | Status: DC
  Administered 2015-06-12 – 2015-06-15 (×4): 60 mg via ORAL
  Filled 2015-06-11 (×5): qty 1

## 2015-06-11 MED ORDER — ATORVASTATIN CALCIUM 40 MG PO TABS
40.0000 mg | ORAL_TABLET | Freq: Every morning | ORAL | Status: DC
  Administered 2015-06-12 – 2015-06-15 (×4): 40 mg via ORAL
  Filled 2015-06-11 (×4): qty 1

## 2015-06-11 MED ORDER — MELATONIN 3 MG PO TABS: 3.0000 mg | ORAL_TABLET | Freq: Every evening | ORAL | Status: DC | PRN

## 2015-06-11 MED ORDER — SODIUM CHLORIDE 0.9 % IJ SOLN
3.0000 mL | Freq: Two times a day (BID) | INTRAMUSCULAR | Status: DC
  Administered 2015-06-11 – 2015-06-15 (×7): 3 mL via INTRAVENOUS

## 2015-06-11 MED ORDER — FUROSEMIDE 10 MG/ML IJ SOLN
80.0000 mg | Freq: Two times a day (BID) | INTRAMUSCULAR | Status: DC
  Administered 2015-06-11 – 2015-06-12 (×3): 80 mg via INTRAVENOUS
  Filled 2015-06-11 (×4): qty 8

## 2015-06-11 MED ORDER — HYDRALAZINE HCL 10 MG PO TABS
10.0000 mg | ORAL_TABLET | Freq: Two times a day (BID) | ORAL | Status: DC
  Administered 2015-06-11 – 2015-06-15 (×8): 10 mg via ORAL
  Filled 2015-06-11 (×10): qty 1

## 2015-06-11 MED ORDER — CARVEDILOL 12.5 MG PO TABS
12.5000 mg | ORAL_TABLET | Freq: Two times a day (BID) | ORAL | Status: DC
  Administered 2015-06-11 – 2015-06-15 (×8): 12.5 mg via ORAL
  Filled 2015-06-11 (×10): qty 1

## 2015-06-11 MED ORDER — FUROSEMIDE 10 MG/ML IJ SOLN
40.0000 mg | Freq: Once | INTRAMUSCULAR | Status: AC
  Administered 2015-06-11: 40 mg via INTRAVENOUS
  Filled 2015-06-11: qty 4

## 2015-06-11 MED ORDER — HEPARIN SODIUM (PORCINE) 5000 UNIT/ML IJ SOLN
5000.0000 [IU] | Freq: Three times a day (TID) | INTRAMUSCULAR | Status: DC
  Administered 2015-06-11 – 2015-06-15 (×10): 5000 [IU] via SUBCUTANEOUS
  Filled 2015-06-11 (×10): qty 1

## 2015-06-11 MED ORDER — DOCUSATE SODIUM 100 MG PO CAPS
100.0000 mg | ORAL_CAPSULE | Freq: Two times a day (BID) | ORAL | Status: DC | PRN
  Filled 2015-06-11: qty 1

## 2015-06-11 MED ORDER — RENA-VITE RX 1 MG PO TABS
1.0000 | ORAL_TABLET | Freq: Every morning | ORAL | Status: DC
  Administered 2015-06-12 – 2015-06-15 (×4): 1 via ORAL
  Filled 2015-06-11 (×4): qty 1

## 2015-06-11 MED ORDER — ACETAMINOPHEN 650 MG RE SUPP: 650.0000 mg | Freq: Four times a day (QID) | RECTAL | Status: DC | PRN

## 2015-06-11 MED ORDER — ONDANSETRON HCL 4 MG/2ML IJ SOLN: 4.0000 mg | Freq: Four times a day (QID) | INTRAMUSCULAR | Status: DC | PRN

## 2015-06-11 MED ORDER — HYDROCODONE-ACETAMINOPHEN 5-325 MG PO TABS: 1.0000 | ORAL_TABLET | Freq: Four times a day (QID) | ORAL | Status: DC | PRN

## 2015-06-11 MED ORDER — PARICALCITOL 1 MCG PO CAPS
1.0000 ug | ORAL_CAPSULE | Freq: Every day | ORAL | Status: DC
  Administered 2015-06-12 – 2015-06-15 (×4): 1 ug via ORAL
  Filled 2015-06-11 (×4): qty 1

## 2015-06-11 MED ORDER — ACETAMINOPHEN 325 MG PO TABS: 650.0000 mg | ORAL_TABLET | Freq: Four times a day (QID) | ORAL | Status: DC | PRN

## 2015-06-11 NOTE — Consult Note (Signed)
Consultation Note Date: 06/11/2015   Patient Name: Dorothy Bartlett  DOB: 1926-08-18  MRN: [id number]  Age / Sex: 81 y.o., female   PCP: Eliott Nine, MD Referring Physician: Levert Feinstein, MD  Reason for Consultation: Establishing goals of care  Palliative Care Assessment and Plan Summary of Established Goals of Care and Medical Treatment Preferences   Clinical Assessment/Narrative: Pt is a 81 yo female who presented to ED with a 2 day history of increasing shortness of breath. Pt was in complete heart block in May and had dual chamber pacemaker inserted. She now has RV lead dysfunction. CXR and BNP suggest worsening heart failure. Pt has stage 5 kidney disease with creatinine of 3.67. This is close to her previous readings. At that time in May she had refused HD. She is still refusing HD but does report that she would like to have procedure for revision of lead. She was scheduled to do this as an out-patient 06/21/15 by Dr. Johney Frame. Pt when seen earlier by cardiology today felt that current symptoms were related more to her renal dysfunction with volume overload and was not certain that revision would provide that much relief. Palliative Care consulted if that is the case, and her previous wish not to pursue HD, to discuss further goals of care.In the interim pt's HR began to drop into the 30's. Pt is pleasant alert. She denies feeling anxious. Was given additional lasix which has helped but her breathing is still very labored. During my assessment pt received word that she was going to transfer to step down unit for closer monitoring. Before she was transferred her 2 children and granddaughter were present and daughter in law.  We did discuss  disease progression related to CKD as well as now associated cardiac function . Gently introduced concept of goals of care to family. As they are in a rapidly changing situation and they had not discussed specifically with Mrs. Brenner issues other  than HD, they were not able to make further decisions regarding her care. They told me they did discuss with one of the physicians seeing her in the hospital that were she to have an acute event after surgery they would want to proceed with chest compressions as well as defibrillation. Regarding intubation they were less clear cut but recognized she would not want any sustained intubation .   Contacts/Participants in Discussion: Primary Decision Maker: Pt but share decision making as a family HCPOA: not clear if there is a legally  designated  Management consultant but son and daughter present    Code Status/Advance Care Planning:  Full Code  Symptom Management:   Dyspnea: At this point pt is symptomatic but decision pending on RV lead revision perhaps even tonight. Defer to cardiology  Additional Recommendations (Limitations, Scope, Preferences):  HD: At this point pt still stating that she would not HD.  Psycho-social/Spiritual:   Support System: yes.   Desire for further Chaplaincy support:no  Prognosis: Unable to determine  Discharge Planning:  At this point plan is to return home when medically stable       Chief Complaint/History of Present Illness: Pt is a 81 yo female admitted with a 2 day history of worsening shortness of breath. Her CXR and BNP indicate acute on chronic CHF. Last EF was 40-45%. In May pt was in complete heart block and had a pacemaker inserted. It now needs RV lead revision.  Primary Diagnoses  Present on Admission:  . Acute on chronic combined systolic  and diastolic congestive heart failure . Hyperlipidemia . Hypertensive heart disease . Intrinsic asthma . CKD stage 5, GFR less than 15 ml/min . PVD (peripheral vascular disease) . Chronic combined systolic and diastolic heart failure . Type 2 diabetes mellitus with renal manifestations . Obesity (BMI 30-39.9) . 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 Complete heart  block . Pacemaker-[employer] Jude May 2016 . Pacemaker lead malfunction  Palliative Review of Systems: Too short of breath I have reviewed the medical record, interviewed the patient and family, and examined the patient. The following aspects are pertinent.  Past Medical History  Diagnosis Date  . Hyperlipidemia   . Carotid stenosis     Carotid US (12/2013): < 60% RCA, < 40% LICA.  [name] CKD (chronic kidney disease), stage IV     notes 02/22/2014; Dr. Hyman Hopes  . Hypertension   . Coronary artery disease     a. s/p MI 1995 tx w/ PTCA to Dx, Ramus, ap LAD;  b. LHC (11/2004):  Inferior HK, EF 60%, proximal LAD 50%, ostial D1 80%, mid D1 95%, mid LAD 50-60%, distal LAD at the apex 90%, OM1 occluded (fills late by left to left collaterals), RCA 75%. Medical therapy recommended.;  c. Lexiscan Myoview (12/2013):  Prior ant-lat scar with very small area of peri-infarct ischemia in ant wall, EF 40 (high risk)   . Obesity (BMI 30-39.9)   . 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 Type 2 diabetes mellitus with renal manifestations   . Intrinsic asthma 01/28/2008     Mild intermittant asthma, PFT's 01/28/08     . Old anterolateral myocardial infarction   . PVD (peripheral vascular disease) 02/16/2014    2015, right femoral peroneal bypass grafting and left femoral-tibial bypass grafting by Dr. Arbie Cookey for rest pain as well as nonhealing ulcer on toe accompanied with toe amputation  Arteriogram October 2015 with patent graft on the right, 70% stenosis of graft on the left proximally.   . Chronic combined systolic and diastolic heart failure 04/13/2014  . Complete heart block     a. 05/2015 s/p [facility] Assurity DR model [id number] dual chamber PPM (serial number [id number]) (Dr. Johney Frame).  [name] Hx of echocardiogram     a. Echo 5/16: dist septal, dist ant, dist inf and apical AK, EF 40-45%, mod MR, mild LAE, PASP 72   History   Social History  . Marital Status: Widowed    Spouse  Name: N/A  . Number of Children: N/A  . Years of Education: N/A   Occupational History  . Retired    Social History Main Topics  . Smoking status: Never Smoker   . Smokeless tobacco: Never Used  . Alcohol Use: No  . Drug Use: No  . Sexual Activity: No   Other Topics Concern  . None   Social History Narrative   Tobacco use cigarettes: Former Smoker   Occupation: Unemployed, retired   Marital Status: Single   Children: 2 daughters   Family History  Problem Relation Age of Onset  . Diabetes Father   . Hypertension Father   . Hypertension Daughter   . Hyperlipidemia Daughter   . Hyperlipidemia Son   . Hypertension Sister    Scheduled Meds: . [START ON 06/12/2015] aspirin EC  81 mg Oral q morning - 10a  . [START ON 06/12/2015] atorvastatin  40 mg Oral q morning - 10a  . carvedilol  12.5 mg Oral BID  .  furosemide  40 mg Intravenous Once  . furosemide  80 mg Intravenous BID  . heparin  5,000 Units Subcutaneous 3 times per day  . hydrALAZINE  10 mg Oral BID  . isosorbide mononitrate  60 mg Oral Daily  . [START ON 06/12/2015] linagliptin  5 mg Oral Daily  . [START ON 06/12/2015] paricalcitol  1 mcg Oral Daily  . [START ON 06/12/2015] RENA-VITE RX  1 tablet Oral q morning - 10a  . sodium chloride  3 mL Intravenous Q12H   Continuous Infusions:  PRN Meds:.acetaminophen **OR** acetaminophen, albuterol, docusate sodium, loratadine, ondansetron **OR** ondansetron (ZOFRAN) IV, ramelteon Medications Prior to Admission:  Prior to Admission medications   Medication Sig Start Date End Date Taking? Authorizing Provider  albuterol (PROVENTIL HFA;VENTOLIN HFA) 108 (90 BASE) MCG/ACT inhaler Inhale 2 puffs into the lungs every 6 (six) hours as needed for wheezing or shortness of breath.   Yes Historical Provider, MD  aspirin 81 MG tablet Take 81 mg by mouth every morning.    Yes Historical Provider, MD  atorvastatin (LIPITOR) 40 MG tablet Take 40 mg by mouth every morning.  01/25/14  Yes  Historical Provider, MD  B Complex-C-Folic Acid (RENA-VITE RX) 1 MG TABS Take 1 tablet by mouth every morning.  02/05/14  Yes Historical Provider, MD  carvedilol (COREG) 12.5 MG tablet Take 1 tablet (12.5 mg total) by mouth 2 (two) times daily. 02/04/14  Yes Lyn Records, MD  docusate sodium (COLACE) 100 MG capsule Take 100 mg by mouth 2 (two) times daily.    Yes Historical Provider, MD  furosemide (LASIX) 40 MG tablet Take 1 tablet (40 mg total) by mouth daily. 04/20/14  Yes Beatrice Lecher, PA-C  hydrALAZINE (APRESOLINE) 10 MG tablet Take 1 tablet by mouth 2 (two) times daily. 05/25/15  Yes Historical Provider, MD  insulin lispro (HUMALOG) 100 UNIT/ML injection Inject 5 Units into the skin 2 (two) times daily as needed for high blood sugar. Only takes if CBG >250.   Yes Historical Provider, MD  isosorbide mononitrate (IMDUR) 30 MG 24 hr tablet Take 30 mg by mouth daily. 05/27/15  Yes Historical Provider, MD  loratadine (CLARITIN) 10 MG tablet Take 10 mg by mouth daily as needed for allergies.   Yes Historical Provider, MD  Melatonin 3 MG TABS Take 3 mg by mouth at bedtime as needed (for sleep).    Yes Historical Provider, MD  paricalcitol (ZEMPLAR) 1 MCG capsule Take 1 mcg by mouth daily.  04/07/14  Yes Historical Provider, MD  sitaGLIPtin (JANUVIA) 25 MG tablet Take 25 mg by mouth daily.   Yes Historical Provider, MD  nitroGLYCERIN (NITROSTAT) 0.4 MG SL tablet Place 1 tablet (0.4 mg total) under the tongue every 5 (five) minutes x 3 doses as needed for chest pain. 05/15/15   Beatrice Lecher, PA-C   No Known Allergies CBC:    Component Value Date/Time   WBC 9.2 06/11/2015 0813   WBC 8.4 09/03/2008 1100   HGB 10.4* 06/11/2015 0813   HGB 11.3* 09/03/2008 1100   HCT 33.3* 06/11/2015 0813   HCT 34.3* 09/03/2008 1100   PLT 190 06/11/2015 0813   PLT 146 09/03/2008 1100   MCV 94.3 06/11/2015 0813   MCV 93.5 09/03/2008 1100   NEUTROABS 6.7 05/08/2015 1314   NEUTROABS 5.3 09/03/2008 1100   LYMPHSABS 2.1  05/08/2015 1314   LYMPHSABS 2.4 09/03/2008 1100   MONOABS 0.8 05/08/2015 1314   MONOABS 0.6 09/03/2008 1100   EOSABS 0.0  05/08/2015 1314   EOSABS 0.1 09/03/2008 1100   BASOSABS 0.0 05/08/2015 1314   BASOSABS 0.0 09/03/2008 1100   Comprehensive Metabolic Panel:    Component Value Date/Time   NA 142 06/11/2015 0813   K 4.6 06/11/2015 0813   CL 106 06/11/2015 0813   CO2 23 06/11/2015 0813   BUN 61* 06/11/2015 0813   CREATININE 3.67* 06/11/2015 0813   GLUCOSE 121* 06/11/2015 0813   CALCIUM 9.7 06/11/2015 0813   AST 51* 06/11/2015 0813   ALT 47 06/11/2015 0813   ALKPHOS 78 06/11/2015 0813   BILITOT 0.8 06/11/2015 0813   PROT 7.2 06/11/2015 0813   ALBUMIN 3.8 06/11/2015 0813    Physical Exam: Vital Signs: BP 130/85 mmHg  Pulse 73  Temp(Src) 97.5 F (36.4 C) (Oral)  Resp 18  Ht 4\' 11"  (1.499 m)  Wt 81.738 kg (180 lb 3.2 oz)  BMI 36.38 kg/m2  SpO2 100% SpO2: SpO2: 100 % O2 Device: O2 Device: Nasal Cannula O2 Flow Rate: O2 Flow Rate (L/min): 2 L/min Intake/output summary:  Intake/Output Summary (Last 24 hours) at 06/11/15 1731 Last data filed at 06/11/15 1334  Gross per 24 hour  Intake    120 ml  Output    225 ml  Net   -105 ml   LBM:   Baseline Weight: Weight: 79.697 kg (175 lb 11.2 oz) Most recent weight: Weight: 81.738 kg (180 lb 3.2 oz)  Exam Findings:  General: Elderly female, alert with significant work of breathing Cardiac: Has external pacemaker pads on Resp Labored. Very poor air movment         Palliative Performance Scale: 30-40%              Additional Data Reviewed: Recent Labs     06/11/15  0813  WBC  9.2  HGB  10.4*  PLT  190  NA  142  BUN  61*  CREATININE  3.67*     Time In: 1630 Time Out: 1735 Time Total: Greater than 50%  of this time was spent counseling and coordinating care related to the above assessment and plan. Staffed with Dr. Loma Newton. Pt transferred to 3W. Will follow up tomorrow  Signed by: Irean Hong,  NP  Irean Hong, NP  06/11/2015, 5:31 PM  Please contact Palliative Medicine Team phone at [phone] for questions and concerns.

## 2015-06-11 NOTE — H&P (Signed)
Date: 06/11/2015               Patient Name:  Dorothy Bartlett MRN: [id number]  DOB: Nov 06, 1926 Age / Sex: 81 y.o., female   PCP: Eliott Nine, MD         Medical Service: Internal Medicine Teaching Service         Attending Physician: Dr. Levert Feinstein, MD    First Contact: Dr. Loma Newton Pager: [phone]  Second Contact: Dr. Johna Roles Pager: [phone]       After Hours (After 5p/  First Contact Pager: [phone]  weekends / holidays): Second Contact Pager: [phone]   Chief Complaint: Shortness of Breath  History of Present Illness:   Patient is an 81 year old woman with a history of combined congestive heart failure, complete heart block (status post dual-chamber pacemaker in May 2016) peripheral vascular disease (status post multiple bypass grafts), stage V chronic kidney disease, hypertension, coronary artery disease (status post PCI in 1994, adequate therapy for multivessel disease from cath in 2005), normocytic anemia, type 2 diabetes, insomnia who presents to the hospital for 3 days of dyspnea. At baseline, patient lives at home alone and is able to complete all of her ADLs by herself at home. She notes some dyspnea that is worse with exertion over the last several days with no associated chest pain. She denies any presyncope or syncope. She has not noticed any increase in her weight at home. She does note orthopnea with chronic lower extremity edema, but is not any worse than baseline. Otherwise, patient denies any fevers, chills, abdominal pain, constipation, diarrhea, dysuria, or hematuria.  Patient was recently admitted between 05/08/2015 and 05/15/2015 for syncope in the setting of complete heart block, now status post implantation of dual chamber pacemaker. That hospitalization was remarkable for fluid overload requiring aggressive diuresis as patient had refused dialysis. Since then, patient has been planned for a lead revision from electrophysiology clinic for intermittent  capture. In the emergency department, patient was given Lasix 40 mg IV.  Meds:  (Not in a hospital admission) Current Facility-Administered Medications  Medication Dose Route Frequency Provider Last Rate Last Dose  . acetaminophen (TYLENOL) tablet 650 mg  650 mg Oral Q6H PRN Otis Brace, MD       Or  . acetaminophen (TYLENOL) suppository 650 mg  650 mg Rectal Q6H PRN Marjan Rabbani, MD      . albuterol (PROVENTIL HFA;VENTOLIN HFA) 108 (90 BASE) MCG/ACT inhaler 2 puff  2 puff Inhalation Q6H PRN Marjan Rabbani, MD      . aspirin tablet 81 mg  81 mg Oral q morning - 10a Marjan Rabbani, MD      . atorvastatin (LIPITOR) tablet 40 mg  40 mg Oral q morning - 10a Marjan Rabbani, MD      . carvedilol (COREG) tablet 12.5 mg  12.5 mg Oral BID Marjan Rabbani, MD      . docusate sodium (COLACE) capsule 100 mg  100 mg Oral BID PRN Marjan Rabbani, MD      . furosemide (LASIX) injection 40 mg  40 mg Intravenous Once Marjan Rabbani, MD      . furosemide (LASIX) injection 80 mg  80 mg Intravenous BID Marjan Rabbani, MD      . heparin injection 5,000 Units  5,000 Units Subcutaneous 3 times per day Otis Brace, MD      . hydrALAZINE (APRESOLINE) tablet 10 mg  10 mg Oral BID Otis Brace, MD      . HYDROcodone-acetaminophen (  NORCO/VICODIN) 5-325 MG per tablet 1-2 tablet  1-2 tablet Oral Q6H PRN Otis Brace, MD      . isosorbide mononitrate (IMDUR) 24 hr tablet 60 mg  60 mg Oral Daily Marjan Rabbani, MD      . linagliptin (TRADJENTA) tablet 5 mg  5 mg Oral Daily Marjan Rabbani, MD      . loratadine (CLARITIN) tablet 10 mg  10 mg Oral Daily PRN Otis Brace, MD      . Melatonin TABS 3 mg  3 mg Oral QHS PRN Marjan Rabbani, MD      . ondansetron (ZOFRAN) tablet 4 mg  4 mg Oral Q6H PRN Marjan Rabbani, MD       Or  . ondansetron (ZOFRAN) injection 4 mg  4 mg Intravenous Q6H PRN Marjan Rabbani, MD      . paricalcitol (ZEMPLAR) capsule 1 mcg  1 mcg Oral Daily Marjan Rabbani, MD      . RENA-VITE RX TABS 1  tablet  1 tablet Oral q morning - 10a Marjan Rabbani, MD      . sodium chloride 0.9 % injection 3 mL  3 mL Intravenous Q12H Otis Brace, MD       Current Outpatient Prescriptions  Medication Sig Dispense Refill  . albuterol (PROVENTIL HFA;VENTOLIN HFA) 108 (90 BASE) MCG/ACT inhaler Inhale 2 puffs into the lungs every 6 (six) hours as needed for wheezing or shortness of breath.    [name] aspirin 81 MG tablet Take 81 mg by mouth every morning.     [name] atorvastatin (LIPITOR) 40 MG tablet Take 40 mg by mouth every morning.     . B Complex-C-Folic Acid (RENA-VITE RX) 1 MG TABS Take 1 tablet by mouth every morning.     . carvedilol (COREG) 12.5 MG tablet Take 1 tablet (12.5 mg total) by mouth 2 (two) times daily. 180 tablet 1  . docusate sodium (COLACE) 100 MG capsule Take 100 mg by mouth 2 (two) times daily.     . furosemide (LASIX) 40 MG tablet Take 1 tablet (40 mg total) by mouth daily.    . hydrALAZINE (APRESOLINE) 10 MG tablet Take 1 tablet by mouth 2 (two) times daily.    [name] HYDROcodone-acetaminophen (NORCO/VICODIN) 5-325 MG per tablet Take 1-2 tablets by mouth every 6 (six) hours as needed for severe pain. 30 tablet 0  . insulin lispro (HUMALOG) 100 UNIT/ML injection Inject 5 Units into the skin 2 (two) times daily as needed for high blood sugar. Only takes if CBG >250.    [name] isosorbide mononitrate (IMDUR) 60 MG 24 hr tablet Take 1 tablet (60 mg total) by mouth daily.    [name] loratadine (CLARITIN) 10 MG tablet Take 10 mg by mouth daily as needed for allergies.    . Melatonin 3 MG TABS Take 3 mg by mouth at bedtime as needed (for sleep).     . nitroGLYCERIN (NITROSTAT) 0.4 MG SL tablet Place 1 tablet (0.4 mg total) under the tongue every 5 (five) minutes x 3 doses as needed for chest pain.  12  . paricalcitol (ZEMPLAR) 1 MCG capsule Take 1 mcg by mouth daily.     . sitaGLIPtin (JANUVIA) 25 MG tablet Take 25 mg by mouth daily.      Past Medical History  Diagnosis Date  . Hyperlipidemia   . Carotid  stenosis     Carotid US (12/2013): < 60% RCA, < 40% LICA.  [name] CKD (chronic kidney disease), stage IV     notes  02/22/2014; Dr. Hyman Hopes  . Hypertension   . Coronary artery disease     a. s/p MI 1995 tx w/ PTCA to Dx, Ramus, ap LAD;  b. LHC (11/2004):  Inferior HK, EF 60%, proximal LAD 50%, ostial D1 80%, mid D1 95%, mid LAD 50-60%, distal LAD at the apex 90%, OM1 occluded (fills late by left to left collaterals), RCA 75%. Medical therapy recommended.;  c. Lexiscan Myoview (12/2013):  Prior ant-lat scar with very small area of peri-infarct ischemia in ant wall, EF 40 (high risk)   . Obesity (BMI 30-39.9)   . 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 Type 2 diabetes mellitus with renal manifestations   . Intrinsic asthma 01/28/2008     Mild intermittant asthma, PFT's 01/28/08     . Old anterolateral myocardial infarction   . PVD (peripheral vascular disease) 02/16/2014    2015, right femoral peroneal bypass grafting and left femoral-tibial bypass grafting by Dr. Arbie Cookey for rest pain as well as nonhealing ulcer on toe accompanied with toe amputation  Arteriogram October 2015 with patent graft on the right, 70% stenosis of graft on the left proximally.   . Chronic combined systolic and diastolic heart failure 04/13/2014  . Complete heart block     a. 05/2015 s/p [facility] Assurity DR model [phone] dual chamber PPM (serial number [id number]) (Dr. Johney Frame).  [name] Hx of echocardiogram     a. Echo 5/16: dist septal, dist ant, dist inf and apical AK, EF 40-45%, mod MR, mild LAE, PASP 72    Past Surgical History  Procedure Laterality Date  . Repair of nerve in left arm  Left 2000  . Cardiac catheterization  1995    w/ PTCA Diag 2nd MI  . Femoral-tibial bypass graft Left 02/24/2014    Procedure: BYPASS GRAFT FEMORAL-TIBIAL ARTERY;  Surgeon: Larina Earthly, MD;  Location[facility] OR;  Service: Vascular;  Laterality: Left;  . Femoral-popliteal bypass graft Right 08/04/2014     Procedure: RIGHT FEMORAL-PERONEAL ARTERY BYPASS GRAFT;  Surgeon: Larina Earthly, MD;  Location: [facility] OR;  Service: Vascular;  Laterality: Right;  . Amputation Right 08/04/2014    Procedure: AMPUTATION DIGIT-RIGHT 3RD TOE;  Surgeon: Larina Earthly, MD;  Location: [facility] OR;  Service: Vascular;  Laterality: Right;  . Lower extremity angiogram Left 01/08/2014    Procedure: LOWER EXTREMITY ANGIOGRAM;  Surgeon: Sherren Kerns, MD;  Location: [facility] CATH LAB;  Service: Cardiovascular;  Laterality: Left;  . Abdominal angiogram  01/08/2014    Procedure: ABDOMINAL ANGIOGRAM;  Surgeon: Sherren Kerns, MD;  Location: [facility] CATH LAB;  Service: Cardiovascular;;  . Lower extremity angiogram Right 08/03/2014    Procedure: LOWER EXTREMITY ANGIOGRAM;  Surgeon: Nada Libman, MD;  Location: [facility] CATH LAB;  Service: Cardiovascular;  Laterality: Right;  . Abdominal aortagram N/A 10/25/2014    Procedure: ABDOMINAL Ronny Flurry;  Surgeon: Chuck Hint, MD;  Location: [facility] CATH LAB;  Service: Cardiovascular;  Laterality: N/A;  . Ep implantable device N/A 05/10/2015    Procedure: Pacemaker Implant;  Surgeon: Hillis Range, MD;  Location: [facility] INVASIVE CV LAB;  Service: Cardiovascular;  Laterality: N/A;     Allergies: Allergies as of 06/11/2015  . (No Known Allergies)    Family History  Problem Relation Age of Onset  . Diabetes Father   . Hypertension Father   . Hypertension Daughter   . Hyperlipidemia Daughter   . Hyperlipidemia Son   . Hypertension Sister  History   Social History  . Marital Status: Widowed    Spouse Name: N/A  . Number of Children: N/A  . Years of Education: N/A   Occupational History  . Retired    Social History Main Topics  . Smoking status: Never Smoker   . Smokeless tobacco: Never Used  . Alcohol Use: No  . Drug Use: No  . Sexual Activity: No   Other Topics Concern  . Not on file   Social History Narrative   Tobacco use cigarettes: Former Smoker   Occupation: Unemployed, retired    Marital Status: Single   Children: 2 daughters     Review of Systems: All pertinent ROS as stated in HPI.   Physical Exam: Blood pressure 112/33, pulse 73, resp. rate 22, weight 175 lb 11.2 oz (79.697 kg), SpO2 98 %. General: resting in bed, upright, in no acute distress HEENT: PERRL, EOMI, no scleral icterus Cardiac: RRR, no rubs, murmurs or gallops Pulm: clear to auscultation bilaterally, moving normal volumes of air Abd: soft, nontender, nondistended, BS present Ext: warm and well perfused, 2+ pitting edema bilateral lower extremities, right greater than left, noted to be chronic Neuro: alert and oriented X3, cranial nerves II-XII grossly intact Skin: no rashes or lesions noted Psych: appropriate affect  Lab results: Basic Metabolic Panel:  Recent Labs  16/10/96 0813  NA 142  K 4.6  CL 106  CO2 23  GLUCOSE 121*  BUN 61*  CREATININE 3.67*  CALCIUM 9.7   Liver Function Tests:  Recent Labs  06/11/15 0813  AST 51*  ALT 47  ALKPHOS 78  BILITOT 0.8  PROT 7.2  ALBUMIN 3.8   No results for input(s): LIPASE, AMYLASE in the last 72 hours. No results for input(s): AMMONIA in the last 72 hours. CBC:  Recent Labs  06/11/15 0813  WBC 9.2  HGB 10.4*  HCT 33.3*  MCV 94.3  PLT 190   Cardiac Enzymes: No results for input(s): CKTOTAL, CKMB, CKMBINDEX, TROPONINI in the last 72 hours. BNP: No results for input(s): PROBNP in the last 72 hours. D-Dimer: No results for input(s): DDIMER in the last 72 hours. CBG: No results for input(s): GLUCAP in the last 72 hours. Hemoglobin A1C: No results for input(s): HGBA1C in the last 72 hours. Fasting Lipid Panel: No results for input(s): CHOL, HDL, LDLCALC, TRIG, CHOLHDL, LDLDIRECT in the last 72 hours. Thyroid Function Tests: No results for input(s): TSH, T4TOTAL, FREET4, T3FREE, THYROIDAB in the last 72 hours. Anemia Panel: No results for input(s): VITAMINB12, FOLATE, FERRITIN, TIBC, IRON, RETICCTPCT in the last 72  hours. Coagulation: No results for input(s): LABPROT, INR in the last 72 hours. Urine Drug Screen: Drugs of Abuse  No results found for: LABOPIA, COCAINSCRNUR, LABBENZ, AMPHETMU, THCU, LABBARB  Alcohol Level: No results for input(s): ETH in the last 72 hours. Urinalysis: No results for input(s): COLORURINE, LABSPEC, PHURINE, GLUCOSEU, HGBUR, BILIRUBINUR, KETONESUR, PROTEINUR, UROBILINOGEN, NITRITE, LEUKOCYTESUR in the last 72 hours.  Invalid input(s): APPERANCEUR  Imaging results:  Dg Chest Port 1 View  06/11/2015   CLINICAL DATA:  Shortness of breath and cough for 2 days  EXAM: PORTABLE CHEST - 1 VIEW  COMPARISON:  05/12/2015  FINDINGS: Cardiac shadow remains enlarged. Pacing device is again seen. The left lung is well aerated. Persistent right-sided effusion and basilar atelectasis is seen. Mild vascular congestion remains. No bony abnormality is seen.  IMPRESSION: Persistent right basilar changes and vascular congestion.   Electronically Signed   By: Alcide Clever  M.D.   On: 06/11/2015 07:36    Other results: EKG Interpretation  Date/Time:  Saturday June 11 2015 07:10:35 EDT Ventricular Rate:  83 PR Interval:  200 QRS Duration: 187 QT Interval:  451 QTC Calculation: 530 R Axis:   -86 Text Interpretation:  Atrial-sensed ventricular-paced rhythm No further analysis attempted due to paced rhythm No significant change since last tracing Confirmed by Denton Lank  MD, Caryn Bee ([id number]) on 06/11/2015 7:20:22 AM   Assessment & Plan by Problem: Active Problems:   Acute on chronic congestive heart failure   CHF exacerbation  Acute decompensated congestive heart failure: Progressive dyspnea over the last 3 days with evidence of vascular congestion on chest x-ray. Elevated BNP of 2283 with a slight elevation of troponin at 0.09. Recent admission for complete heart block status post dual-chamber pacemaker placement. Recent clinic appointment with plan for lead revision for intermittent capture.  Question whether this could be etiology for decompensation. Would also consider CKD as etiology. Echocardiogram from May 2016 with ejection fraction of 40-45% with moderate mitral regurgitation and severe pulmonary hypertension with PASP 72. Patient is on Lasix 40 mg daily at home. Patient's current weight upon admission is 175 which is actually down from 182 from her discharge from May 2016. Otherwise, no history of pulmonary disease otherwise. No evidence of consolidation or infection.  -Consult cardiology for further evaluation for need of lead revision. -Lasix 80 mg twice a day IV (Stage V CKD) -Continue home aspirin 81 mg daily, atorvastatin 40 mg daily, Coreg 12.5 mg twice a day, Imdur 60 mg daily  Complete heart block: Possible etiology for decompensated heart failure as discussed above. Status post [employer] dual-chamber his maker by Dr. Johney Frame in May 2016. Patient initially planned for lead revision given right ventricular lead threshold elevation. In electrophysiology clinic in 06/01/2015, patient found to have intermittent capture at 4 V at 1.5 ms with no loss of capture at 5 V at 1.5 ms. -Consult to cardiology as above  Stage V chronic kidney disease: Creatinine of 3.67. Recent hospitalization in May 2016 was remarkable for acute renal failure although patient refused dialysis throughout that hospitalization. This is close to her baseline of 3.51 upon discharge from her May 2016 hospitalization. -Continue home Zemplar 1 g daily and rena-vite 1 mg daily.  Peripheral vascular disease: Status post femoral-tibial bypass graft in February 2015, femoral-popliteal bypass graft in August 2015, amputation of third toe in August 2015. No acute issues.  Hypertension: At home, patient is on hydralazine 10 mg twice a day, Coreg 12.5 mg twice a day, Imdur 60 mg daily. -Continue home regimen  Coronary artery disease: Status post prior angioplasty in 1995. After catheterization in 2005, medical therapy  recommended for multivessel disease. -Continue Coreg 12.5 mg twice a day, Imdur 60 mg daily, aspirin 81 mg daily  Normocytic anemia: Hemoglobin of 10.4 with an MCV of 94.3. Receives Aranesp injections as an outpatient. -Continue to monitor  Type 2 diabetes: At home, patient is on Januvia 25 mg daily and Humalog 5 units twice a day for blood sugars greater than 250. -Continue home Januvia (linagliptin as inpatient).  -Will monitor CBG  Insomnia: -Continue home melatonin  Diet: 2 gram sodium restriction Prophylaxis: Heparin 5000 u subq Code: Full  Dispo: Disposition is deferred at this time, awaiting improvement of current medical problems. Anticipated discharge in approximately 3 day(s).   The patient does have a current PCP Eliott Nine, MD) and does need an [facility] hospital follow-up appointment after discharge.  The patient does not have transportation limitations that hinder transportation to clinic appointments.  Signed: Harold Barban, M.D., Ph.D. Internal Medicine Teaching Service, PGY-1 06/11/2015, 10:19 AM

## 2015-06-11 NOTE — Progress Notes (Signed)
Discussed with Dr Thurman Coyer- will call pacer rep and see if we can obtain better capture with increased output.  Hamna Shelter PA-C 06/11/2015 4:31 PM   Agree with note written by Montanna Shelter PAC  Nanetta Batty 06/11/2015 4:34 PM

## 2015-06-11 NOTE — Progress Notes (Signed)
PHARMACIST - PHYSICIAN ORDER COMMUNICATION  CONCERNING: P&T Medication Policy on Herbal Medications  DESCRIPTION:  This patient's order for:  melatonin has been noted.  This product(s) is classified as an "herbal" or natural product. Due to a lack of definitive safety studies or FDA approval, nonstandard manufacturing practices, plus the potential risk of unknown drug-drug interactions while on inpatient medications, the Pharmacy and Therapeutics Committee does not permit the use of "herbal" or natural products of this type within [facility].   ACTION TAKEN: The pharmacy department is unable to verify this order at this time and your patient has been informed of this safety policy. Please reevaluate patient's clinical condition at discharge and address if the herbal or natural product(s) should be resumed at that time.  Juanita Craver, PharmD, BCPS Pharmacy Resident [phone]

## 2015-06-11 NOTE — ED Notes (Signed)
Pt from home with c/o worsening SOB starting yesterday.  Pt daughter reports pt woke her up at 4 am this morning stating she hasn't been able to sleep all night.  Pt reports hx of the same, daughter states pt has been "on and off" oxygen in the past.  Pt in NAD, A&O.

## 2015-06-11 NOTE — Consult Note (Signed)
Reason for Consult:   Dyspnea  Requesting Physician: [facility] Primary Cardiologist Dr Katrinka Blazing  HPI: [name] with a past medical history significant for CAD, s/p PTCA in 1995. Since then she has been treated medically. She has stage 5 CRI and has declined dialysis. She was admitted in May 2016 with CHB and had a [employer] pacemaker implanted. She now has RV lead dysfunction and was scheduled to have a revision on 06/21/15- she saw Dr Johney Frame 06/03/15. She presents now with increasing SOB at home. CXR and BNP suggest acute on chronc CHF and we are asked to see her in consult.   PMHx:  Past Medical History  Diagnosis Date  . Hyperlipidemia   . Carotid stenosis     Carotid US (12/2013): < 60% RCA, < 40% LICA.  [name] CKD (chronic kidney disease), stage IV     notes 02/22/2014; Dr. Hyman Hopes  . Hypertension   . Coronary artery disease     a. s/p MI 1995 tx w/ PTCA to Dx, Ramus, ap LAD;  b. LHC (11/2004):  Inferior HK, EF 60%, proximal LAD 50%, ostial D1 80%, mid D1 95%, mid LAD 50-60%, distal LAD at the apex 90%, OM1 occluded (fills late by left to left collaterals), RCA 75%. Medical therapy recommended.;  c. Lexiscan Myoview (12/2013):  Prior ant-lat scar with very small area of peri-infarct ischemia in ant wall, EF 40 (high risk)   . Obesity (BMI 30-39.9)   . 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 Type 2 diabetes mellitus with renal manifestations   . Intrinsic asthma 01/28/2008     Mild intermittant asthma, PFT's 01/28/08     . Old anterolateral myocardial infarction   . PVD (peripheral vascular disease) 02/16/2014    2015, right femoral peroneal bypass grafting and left femoral-tibial bypass grafting by Dr. Arbie Cookey for rest pain as well as nonhealing ulcer on toe accompanied with toe amputation  Arteriogram October 2015 with patent graft on the right, 70% stenosis of graft on the left proximally.   . Chronic combined systolic and  diastolic heart failure 04/13/2014  . Complete heart block     a. 05/2015 s/p [facility] Assurity DR model [id number] dual chamber PPM (serial number [id number]) (Dr. Johney Frame).  [name] Hx of echocardiogram     a. Echo 5/16: dist septal, dist ant, dist inf and apical AK, EF 40-45%, mod MR, mild LAE, PASP 72    Past Surgical History  Procedure Laterality Date  . Repair of nerve in left arm  Left 2000  . Cardiac catheterization  1995    w/ PTCA Diag 2nd MI  . Femoral-tibial bypass graft Left 02/24/2014    Procedure: BYPASS GRAFT FEMORAL-TIBIAL ARTERY;  Surgeon: Larina Earthly, MD;  Location: [facility] OR;  Service: Vascular;  Laterality: Left;  . Femoral-popliteal bypass graft Right 08/04/2014    Procedure: RIGHT FEMORAL-PERONEAL ARTERY BYPASS GRAFT;  Surgeon: Larina Earthly, MD;  Location: [facility] OR;  Service: Vascular;  Laterality: Right;  . Amputation Right 08/04/2014    Procedure: AMPUTATION DIGIT-RIGHT 3RD TOE;  Surgeon: Larina Earthly, MD;  Location: [facility] OR;  Service: Vascular;  Laterality: Right;  . Lower extremity angiogram Left 01/08/2014    Procedure: LOWER EXTREMITY ANGIOGRAM;  Surgeon: Sherren Kerns, MD;  Location[facility] CATH LAB;  Service: Cardiovascular;  Laterality: Left;  . Abdominal angiogram  01/08/2014    Procedure: ABDOMINAL  ANGIOGRAM;  Surgeon: Sherren Kerns, MD;  Location: [facility] CATH LAB;  Service: Cardiovascular;;  . Lower extremity angiogram Right 08/03/2014    Procedure: LOWER EXTREMITY ANGIOGRAM;  Surgeon: Nada Libman, MD;  Location: [facility] CATH LAB;  Service: Cardiovascular;  Laterality: Right;  . Abdominal aortagram N/A 10/25/2014    Procedure: ABDOMINAL Ronny Flurry;  Surgeon: Chuck Hint, MD;  Location: [facility] CATH LAB;  Service: Cardiovascular;  Laterality: N/A;  . Ep implantable device N/A 05/10/2015    Procedure: Pacemaker Implant;  Surgeon: Hillis Range, MD;  Location: [facility] INVASIVE CV LAB;  Service: Cardiovascular;  Laterality: N/A;    SOCHx:  reports that she has never smoked. She has never  used smokeless tobacco. She reports that she does not drink alcohol or use illicit drugs.  FAMHx: Family History  Problem Relation Age of Onset  . Diabetes Father   . Hypertension Father   . Hypertension Daughter   . Hyperlipidemia Daughter   . Hyperlipidemia Son   . Hypertension Sister     ALLERGIES: No Known Allergies  ROS: see H&P for complete ROS  HOME MEDICATIONS: Prior to Admission medications   Medication Sig Start Date End Date Taking? Authorizing Provider  albuterol (PROVENTIL HFA;VENTOLIN HFA) 108 (90 BASE) MCG/ACT inhaler Inhale 2 puffs into the lungs every 6 (six) hours as needed for wheezing or shortness of breath.   Yes Historical Provider, MD  aspirin 81 MG tablet Take 81 mg by mouth every morning.    Yes Historical Provider, MD  atorvastatin (LIPITOR) 40 MG tablet Take 40 mg by mouth every morning.  01/25/14  Yes Historical Provider, MD  B Complex-C-Folic Acid (RENA-VITE RX) 1 MG TABS Take 1 tablet by mouth every morning.  02/05/14  Yes Historical Provider, MD  carvedilol (COREG) 12.5 MG tablet Take 1 tablet (12.5 mg total) by mouth 2 (two) times daily. 02/04/14  Yes Lyn Records, MD  docusate sodium (COLACE) 100 MG capsule Take 100 mg by mouth 2 (two) times daily.    Yes Historical Provider, MD  furosemide (LASIX) 40 MG tablet Take 1 tablet (40 mg total) by mouth daily. 04/20/14  Yes Beatrice Lecher, PA-C  hydrALAZINE (APRESOLINE) 10 MG tablet Take 1 tablet by mouth 2 (two) times daily. 05/25/15  Yes Historical Provider, MD  insulin lispro (HUMALOG) 100 UNIT/ML injection Inject 5 Units into the skin 2 (two) times daily as needed for high blood sugar. Only takes if CBG >250.   Yes Historical Provider, MD  isosorbide mononitrate (IMDUR) 30 MG 24 hr tablet Take 30 mg by mouth daily. 05/27/15  Yes Historical Provider, MD  loratadine (CLARITIN) 10 MG tablet Take 10 mg by mouth daily as needed for allergies.   Yes Historical Provider, MD  Melatonin 3 MG TABS Take 3 mg by mouth at  bedtime as needed (for sleep).    Yes Historical Provider, MD  paricalcitol (ZEMPLAR) 1 MCG capsule Take 1 mcg by mouth daily.  04/07/14  Yes Historical Provider, MD  sitaGLIPtin (JANUVIA) 25 MG tablet Take 25 mg by mouth daily.   Yes Historical Provider, MD  nitroGLYCERIN (NITROSTAT) 0.4 MG SL tablet Place 1 tablet (0.4 mg total) under the tongue every 5 (five) minutes x 3 doses as needed for chest pain. 05/15/15   Beatrice Lecher, PA-C    HOSPITAL MEDICATIONS: I have reviewed the patient's current medications.  VITALS: Blood pressure 153/114, pulse 72, temperature 97.8 F (36.6 C), temperature source Oral, resp. rate 16, height 4\' 11"  (  1.499 m), weight 180 lb 3.2 oz (81.738 kg), SpO2 99 %.  PHYSICAL EXAM: General appearance: alert, cooperative, mild distress and morbidly obese Neck: no carotid bruit and no JVD Lungs: clear to auscultation bilaterally Heart: regular rate and rhythm Abdomen: obese Extremities: 1+ pre tibial pitting edema Pulses: diminnished Skin: cool and dry Neurologic: Grossly normal  LABS: Results for orders placed or performed during the hospital encounter of 06/11/15 (from the past 24 hour(s))  CBC     Status: Abnormal   Collection Time: 06/11/15  8:13 AM  Result Value Ref Range   WBC 9.2 4.0 - 10.5 K/uL   RBC 3.53 (L) 3.87 - 5.11 MIL/uL   Hemoglobin 10.4 (L) 12.0 - 15.0 g/dL   HCT 16.1 (L) 09.6 - 04.5 %   MCV 94.3 78.0 - 100.0 fL   MCH 29.5 26.0 - 34.0 pg   MCHC 31.2 30.0 - 36.0 g/dL   RDW 40.9 81.1 - 91.4 %   Platelets 190 150 - 400 K/uL  BNP (order ONLY if patient complains of dyspnea/SOB AND you have documented it for THIS visit)     Status: Abnormal   Collection Time: 06/11/15  8:13 AM  Result Value Ref Range   B Natriuretic Peptide 2283.5 (H) 0.0 - 100.0 pg/mL  Comprehensive metabolic panel     Status: Abnormal   Collection Time: 06/11/15  8:13 AM  Result Value Ref Range   Sodium 142 135 - 145 mmol/L   Potassium 4.6 3.5 - 5.1 mmol/L   Chloride  106 101 - 111 mmol/L   CO2 23 22 - 32 mmol/L   Glucose, Bld 121 (H) 65 - 99 mg/dL   BUN 61 (H) 6 - 20 mg/dL   Creatinine, Ser 7.82 (H) 0.44 - 1.00 mg/dL   Calcium 9.7 8.9 - 95.6 mg/dL   Total Protein 7.2 6.5 - 8.1 g/dL   Albumin 3.8 3.5 - 5.0 g/dL   AST 51 (H) 15 - 41 U/L   ALT 47 14 - 54 U/L   Alkaline Phosphatase 78 38 - 126 U/L   Total Bilirubin 0.8 0.3 - 1.2 mg/dL   GFR calc non Af Amer 10 (L) >60 mL/min   GFR calc Af Amer 12 (L) >60 mL/min   Anion gap 13 5 - 15  I-stat troponin, ED  (not at [facility])     Status: Abnormal   Collection Time: 06/11/15  8:21 AM  Result Value Ref Range   Troponin i, poc 0.09 (HH) 0.00 - 0.08 ng/mL   Comment NOTIFIED PHYSICIAN    Comment 3          Urinalysis, Routine w reflex microscopic (not at [facility])     Status: None   Collection Time: 06/11/15 10:42 AM  Result Value Ref Range   Color, Urine YELLOW YELLOW   APPearance CLEAR CLEAR   Specific Gravity, Urine 1.009 1.005 - 1.030   pH 5.0 5.0 - 8.0   Glucose, UA NEGATIVE NEGATIVE mg/dL   Hgb urine dipstick NEGATIVE NEGATIVE   Bilirubin Urine NEGATIVE NEGATIVE   Ketones, ur NEGATIVE NEGATIVE mg/dL   Protein, ur NEGATIVE NEGATIVE mg/dL   Urobilinogen, UA 0.2 0.0 - 1.0 mg/dL   Nitrite NEGATIVE NEGATIVE   Leukocytes, UA NEGATIVE NEGATIVE  Glucose, capillary     Status: Abnormal   Collection Time: 06/11/15 12:10 PM  Result Value Ref Range   Glucose-Capillary 141 (H) 65 - 99 mg/dL   Comment 1 Notify RN    Comment 2  Document in Chart     EKG: Paced with occasional non conducted paced beat  IMAGING: Dg Chest Port 1 View  06/11/2015   CLINICAL DATA:  Shortness of breath and cough for 2 days  EXAM: PORTABLE CHEST - 1 VIEW  COMPARISON:  05/12/2015  FINDINGS: Cardiac shadow remains enlarged. Pacing device is again seen. The left lung is well aerated. Persistent right-sided effusion and basilar atelectasis is seen. Mild vascular congestion remains. No bony abnormality is seen.  IMPRESSION: Persistent  right basilar changes and vascular congestion.   Electronically Signed   By: Alcide Clever M.D.   On: 06/11/2015 07:36    IMPRESSION: Principal Problem:   Acute on chronic combined systolic and diastolic congestive heart failure Active Problems:   CKD stage 5, GFR less than 15 ml/min   Pacemaker-St Jude May 2016   Pacemaker lead malfunction   Hypertensive heart disease   CAD S/P PTCA 1995   Chronic combined systolic and diastolic heart failure   Type 2 diabetes mellitus with renal manifestations   Complete heart block   Cardiomyopathy-EF 40-45% by echo May 2016   Hyperlipidemia   Intrinsic asthma   PVD (peripheral vascular disease)   Obesity (BMI 30-39.9)   Anemia due to pre-end-stage renal disease treated with erythropoietin   RECOMMENDATION: I think she probably has CHF from her end stage renal disease as opposed to occasional missed pacer beat. Will review with MD- doubt 80 mg of Lasix is going to diurese her. The real issue may be palliative care if she does not want dialysis.    Time Spent Directly with Patient: 45 minutes  Abelino Derrick [phone] beeper 06/11/2015, 1:07 PM   Agree with note written by [facility]  I have seen and examined Dorothy Bartlett. Chronically ill elderly female with multiple med problems notably ISCM with EF 40%, mult cor interventions in past as well as PVOV, CHB s/p PTVPM with increased RV thresholds. She saw Dr Johney Frame who was planning on revising her lead on 6/21. She is admitted now with increasing SOB and periph edema. Her BNP is elevated. Stage 5 CKD (Dr Hyman Hopes) and pt doesn't want HD. On 40 of lasix at home (put on 80 mg here). CXR shows interstitial edema and pleural effusions. I think her real issue is CRI with volume overload. I had a long discussion with pt and her daughter about palliative care and EOL issues. I'm not sure that a lead revision will make her feel any better and she would be at increased risk of pocket infxn. . What she really  needs is HD which isn't practical in her case . I suggest a Palliative Care consult.  Nanetta Batty 06/11/2015 1:44 PM

## 2015-06-11 NOTE — Progress Notes (Signed)
Evaluated the patient per request of the day team. Patient resting comfortably in bed, no acute distress, no difficulty breathing. Vitals stable.  Says she is feeling much better. Reviewed telemetry, appears that her pacemaker is capturing, pacing in the 70's.   Lauris Chroman, MD PGY-2, Internal Medicine Pager: [phone]

## 2015-06-11 NOTE — ED Provider Notes (Addendum)
CSN: [id number]     Arrival date & time 06/11/15  7846 History   First MD Initiated Contact with Patient 06/11/15 [phone]     Chief Complaint  Patient presents with  . Shortness of Breath     (Consider location/radiation/quality/duration/timing/severity/associated sxs/prior Treatment) Patient is a 81 y.o. female presenting with shortness of breath. The history is provided by the patient and a relative.  Shortness of Breath Associated symptoms: no abdominal pain, no chest pain, no fever, no headaches, no neck pain, no rash, no sore throat and no vomiting   Patient w hx cad, chf, pacemaker 5/16, c/o progressive sob in the past 3 days. Moderate-sev. Worse in early mornings. ?orthopnea. No pnd. No associated nv or diaphoresis. Denies current or recent cp or discomfort. States compliant w normal meds. Urinating or normal amount. States bilateral leg edema is c/w baseline. Occasional non prod cough. No fever or chills. Pt unaware of change in weight. Normal appetite.      Past Medical History  Diagnosis Date  . Hyperlipidemia   . Carotid stenosis     Carotid US (12/2013): < 60% RCA, < 40% LICA.  [name] CKD (chronic kidney disease), stage IV     notes 02/22/2014; Dr. Hyman Hopes  . Hypertension   . Coronary artery disease     a. s/p MI 1995 tx w/ PTCA to Dx, Ramus, ap LAD;  b. LHC (11/2004):  Inferior HK, EF 60%, proximal LAD 50%, ostial D1 80%, mid D1 95%, mid LAD 50-60%, distal LAD at the apex 90%, OM1 occluded (fills late by left to left collaterals), RCA 75%. Medical therapy recommended.;  c. Lexiscan Myoview (12/2013):  Prior ant-lat scar with very small area of peri-infarct ischemia in ant wall, EF 40 (high risk)   . Obesity (BMI 30-39.9)   . 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 Type 2 diabetes mellitus with renal manifestations   . Intrinsic asthma 01/28/2008     Mild intermittant asthma, PFT's 01/28/08     . Old anterolateral myocardial infarction   . PVD  (peripheral vascular disease) 02/16/2014    2015, right femoral peroneal bypass grafting and left femoral-tibial bypass grafting by Dr. Arbie Cookey for rest pain as well as nonhealing ulcer on toe accompanied with toe amputation  Arteriogram October 2015 with patent graft on the right, 70% stenosis of graft on the left proximally.   . Chronic combined systolic and diastolic heart failure 04/13/2014  . Complete heart block     a. 05/2015 s/p [facility] Assurity DR model [id number] dual chamber PPM (serial number [id number]) (Dr. Johney Frame).  [name] Hx of echocardiogram     a. Echo 5/16: dist septal, dist ant, dist inf and apical AK, EF 40-45%, mod MR, mild LAE, PASP 72   Past Surgical History  Procedure Laterality Date  . Repair of nerve in left arm  Left 2000  . Cardiac catheterization  1995    w/ PTCA Diag 2nd MI  . Femoral-tibial bypass graft Left 02/24/2014    Procedure: BYPASS GRAFT FEMORAL-TIBIAL ARTERY;  Surgeon: Larina Earthly, MD;  Location: [facility] OR;  Service: Vascular;  Laterality: Left;  . Femoral-popliteal bypass graft Right 08/04/2014    Procedure: RIGHT FEMORAL-PERONEAL ARTERY BYPASS GRAFT;  Surgeon: Larina Earthly, MD;  Location: [facility] OR;  Service: Vascular;  Laterality: Right;  . Amputation Right 08/04/2014    Procedure: AMPUTATION DIGIT-RIGHT 3RD TOE;  Surgeon: Larina Earthly, MD;  Location: [facility] OR;  Service:  Vascular;  Laterality: Right;  . Lower extremity angiogram Left 01/08/2014    Procedure: LOWER EXTREMITY ANGIOGRAM;  Surgeon: Sherren Kerns, MD;  Location: [facility] CATH LAB;  Service: Cardiovascular;  Laterality: Left;  . Abdominal angiogram  01/08/2014    Procedure: ABDOMINAL ANGIOGRAM;  Surgeon: Sherren Kerns, MD;  Location: [facility] CATH LAB;  Service: Cardiovascular;;  . Lower extremity angiogram Right 08/03/2014    Procedure: LOWER EXTREMITY ANGIOGRAM;  Surgeon: Nada Libman, MD;  Location: [facility] CATH LAB;  Service: Cardiovascular;  Laterality: Right;  . Abdominal aortagram N/A 10/25/2014    Procedure:  ABDOMINAL Ronny Flurry;  Surgeon: Chuck Hint, MD;  Location: [facility] CATH LAB;  Service: Cardiovascular;  Laterality: N/A;  . Ep implantable device N/A 05/10/2015    Procedure: Pacemaker Implant;  Surgeon: Hillis Range, MD;  Location: [facility] INVASIVE CV LAB;  Service: Cardiovascular;  Laterality: N/A;   Family History  Problem Relation Age of Onset  . Diabetes Father   . Hypertension Father   . Hypertension Daughter   . Hyperlipidemia Daughter   . Hyperlipidemia Son   . Hypertension Sister    History  Substance Use Topics  . Smoking status: Never Smoker   . Smokeless tobacco: Never Used  . Alcohol Use: No   OB History    No data available     Review of Systems  Constitutional: Negative for fever and chills.  HENT: Negative for sore throat.   Eyes: Negative for visual disturbance.  Respiratory: Positive for shortness of breath.   Cardiovascular: Negative for chest pain and palpitations.  Gastrointestinal: Negative for vomiting, abdominal pain and diarrhea.  Endocrine: Negative for polyuria.  Genitourinary: Negative for dysuria and flank pain.  Musculoskeletal: Negative for back pain and neck pain.  Skin: Negative for rash.  Neurological: Negative for headaches.  Hematological: Does not bruise/bleed easily.  Psychiatric/Behavioral: Negative for confusion.      Allergies  Review of patient's allergies indicates no known allergies.  Home Medications   Prior to Admission medications   Medication Sig Start Date End Date Taking? Authorizing Provider  albuterol (PROVENTIL HFA;VENTOLIN HFA) 108 (90 BASE) MCG/ACT inhaler Inhale 2 puffs into the lungs every 6 (six) hours as needed for wheezing or shortness of breath.    Historical Provider, MD  aspirin 81 MG tablet Take 81 mg by mouth every morning.     Historical Provider, MD  atorvastatin (LIPITOR) 40 MG tablet Take 40 mg by mouth every morning.  01/25/14   Historical Provider, MD  B Complex-C-Folic Acid (RENA-VITE RX) 1 MG  TABS Take 1 tablet by mouth every morning.  02/05/14   Historical Provider, MD  carvedilol (COREG) 12.5 MG tablet Take 1 tablet (12.5 mg total) by mouth 2 (two) times daily. 02/04/14   Lyn Records, MD  docusate sodium (COLACE) 100 MG capsule Take 100 mg by mouth 2 (two) times daily.     Historical Provider, MD  furosemide (LASIX) 40 MG tablet Take 1 tablet (40 mg total) by mouth daily. 04/20/14   Beatrice Lecher, PA-C  hydrALAZINE (APRESOLINE) 10 MG tablet Take 1 tablet by mouth 2 (two) times daily. 05/25/15   Historical Provider, MD  HYDROcodone-acetaminophen (NORCO/VICODIN) 5-325 MG per tablet Take 1-2 tablets by mouth every 6 (six) hours as needed for severe pain. 05/15/15   Beatrice Lecher, PA-C  insulin lispro (HUMALOG) 100 UNIT/ML injection Inject 5 Units into the skin 2 (two) times daily as needed for high blood sugar. Only takes if CBG >  250.    Historical Provider, MD  isosorbide mononitrate (IMDUR) 60 MG 24 hr tablet Take 1 tablet (60 mg total) by mouth daily. 05/15/15   Beatrice Lecher, PA-C  loratadine (CLARITIN) 10 MG tablet Take 10 mg by mouth daily as needed for allergies.    Historical Provider, MD  Melatonin 3 MG TABS Take 3 mg by mouth at bedtime as needed (for sleep).     Historical Provider, MD  nitroGLYCERIN (NITROSTAT) 0.4 MG SL tablet Place 1 tablet (0.4 mg total) under the tongue every 5 (five) minutes x 3 doses as needed for chest pain. 05/15/15   Beatrice Lecher, PA-C  paricalcitol (ZEMPLAR) 1 MCG capsule Take 1 mcg by mouth daily.  04/07/14   Historical Provider, MD  sitaGLIPtin (JANUVIA) 25 MG tablet Take 25 mg by mouth daily.    Historical Provider, MD   BP 170/78 mmHg  Pulse 81  Resp 24  SpO2 100% Physical Exam  Constitutional: She is oriented to person, place, and time. She appears well-developed and well-nourished.  Mild resp distress.   HENT:  Nose: Nose normal.  Mouth/Throat: Oropharynx is clear and moist.  Eyes: Conjunctivae are normal. No scleral icterus.  Neck: Neck  supple. No JVD present. No tracheal deviation present.  Cardiovascular: Normal rate, regular rhythm, normal heart sounds and intact distal pulses.  Exam reveals no gallop and no friction rub.   No murmur heard. Pulmonary/Chest: Effort normal. No respiratory distress. She has rales.  Abdominal: Soft. Normal appearance and bowel sounds are normal. She exhibits no distension and no mass. There is no tenderness. There is no rebound and no guarding.  Genitourinary:  No cva tenderness  Musculoskeletal: She exhibits edema.  Moderate bil leg edema to knees bil.   Neurological: She is alert and oriented to person, place, and time.  Skin: Skin is warm and dry. No rash noted. She is not diaphoretic.  Psychiatric: She has a normal mood and affect.  Nursing note and vitals reviewed.   ED Course  Procedures (including critical care time) Labs Review  Results for orders placed or performed during the hospital encounter of 06/11/15  CBC  Result Value Ref Range   WBC 9.2 4.0 - 10.5 K/uL   RBC 3.53 (L) 3.87 - 5.11 MIL/uL   Hemoglobin 10.4 (L) 12.0 - 15.0 g/dL   HCT 91.4 (L) 78.2 - 95.6 %   MCV 94.3 78.0 - 100.0 fL   MCH 29.5 26.0 - 34.0 pg   MCHC 31.2 30.0 - 36.0 g/dL   RDW 21.3 08.6 - 57.8 %   Platelets 190 150 - 400 K/uL  BNP (order ONLY if patient complains of dyspnea/SOB AND you have documented it for THIS visit)  Result Value Ref Range   B Natriuretic Peptide 2283.5 (H) 0.0 - 100.0 pg/mL  Comprehensive metabolic panel  Result Value Ref Range   Sodium 142 135 - 145 mmol/L   Potassium 4.6 3.5 - 5.1 mmol/L   Chloride 106 101 - 111 mmol/L   CO2 23 22 - 32 mmol/L   Glucose, Bld 121 (H) 65 - 99 mg/dL   BUN 61 (H) 6 - 20 mg/dL   Creatinine, Ser 4.69 (H) 0.44 - 1.00 mg/dL   Calcium 9.7 8.9 - 62.9 mg/dL   Total Protein 7.2 6.5 - 8.1 g/dL   Albumin 3.8 3.5 - 5.0 g/dL   AST 51 (H) 15 - 41 U/L   ALT 47 14 - 54 U/L   Alkaline Phosphatase 78  38 - 126 U/L   Total Bilirubin 0.8 0.3 - 1.2 mg/dL    GFR calc non Af Amer 10 (L) >60 mL/min   GFR calc Af Amer 12 (L) >60 mL/min   Anion gap 13 5 - 15  I-stat troponin, ED  (not at [facility])  Result Value Ref Range   Troponin i, poc 0.09 (HH) 0.00 - 0.08 ng/mL   Comment NOTIFIED PHYSICIAN    Comment 3           Dg Chest Port 1 View  06/11/2015   CLINICAL DATA:  Shortness of breath and cough for 2 days  EXAM: PORTABLE CHEST - 1 VIEW  COMPARISON:  05/12/2015  FINDINGS: Cardiac shadow remains enlarged. Pacing device is again seen. The left lung is well aerated. Persistent right-sided effusion and basilar atelectasis is seen. Mild vascular congestion remains. No bony abnormality is seen.  IMPRESSION: Persistent right basilar changes and vascular congestion.   Electronically Signed   By: Alcide Clever M.D.   On: 06/11/2015 07:36   Dg Chest Port 1 View  05/12/2015   CLINICAL DATA:  Pacemaker  EXAM: PORTABLE CHEST - 1 VIEW  COMPARISON:  05/11/2015  FINDINGS: Dual lead pacemaker in good position and unchanged  Cardiac enlargement. Progression of congestive heart failure with increased vascular congestion and edema. Enlarging right pleural effusion. Increased atelectasis in the lung bases.  IMPRESSION: Progression of congestive heart failure with edema. Enlarging right effusion and enlarging bibasilar atelectasis.   Electronically Signed   By: Marlan Palau M.D.   On: 05/12/2015 10:23       EKG Interpretation   Date/Time:  Saturday June 11 2015 07:10:35 EDT Ventricular Rate:  83 PR Interval:  200 QRS Duration: 187 QT Interval:  451 QTC Calculation: 530 R Axis:   -86 Text Interpretation:  Atrial-sensed ventricular-paced rhythm No further  analysis attempted due to paced rhythm No significant change since last  tracing Confirmed by Krishika Bugge  MD, Ames Hoban ([id number]) on 06/11/2015 7:20:22 AM      MDM   Iv ns. Continuous pulse ox and monitor. o2 [place]. Stat pcxr. Labs. Ecg.  Reviewed nursing notes and prior charts for additional history.   Lasix  iv.  Given degree dyspnea, leg edema, vascular congestion on cxr, elev bnp-  Will admit to med service re chf exacerbation.  Unassigned medicine paged for admission.      Cathren Laine, MD 06/11/15 [phone]

## 2015-06-11 NOTE — Progress Notes (Signed)
Received pt from 3E into 3W20 alert, and conversant, mildly respiratory distressed at rest using accessory muscles.  Denies any chest pain or discomfort. HR in 70's on the monitor, ventricular pacing. Family members accompanied.

## 2015-06-12 ENCOUNTER — Inpatient Hospital Stay (HOSPITAL_COMMUNITY): Payer: Medicare Other

## 2015-06-12 DIAGNOSIS — I132 Hypertensive heart and chronic kidney disease with heart failure and with stage 5 chronic kidney disease, or end stage renal disease: Secondary | ICD-10-CM

## 2015-06-12 DIAGNOSIS — I272 Pulmonary hypertension, unspecified: Secondary | ICD-10-CM | POA: Insufficient documentation

## 2015-06-12 DIAGNOSIS — Z7982 Long term (current) use of aspirin: Secondary | ICD-10-CM

## 2015-06-12 DIAGNOSIS — Z79899 Other long term (current) drug therapy: Secondary | ICD-10-CM

## 2015-06-12 DIAGNOSIS — E1122 Type 2 diabetes mellitus with diabetic chronic kidney disease: Secondary | ICD-10-CM

## 2015-06-12 DIAGNOSIS — E1151 Type 2 diabetes mellitus with diabetic peripheral angiopathy without gangrene: Secondary | ICD-10-CM

## 2015-06-12 DIAGNOSIS — Z794 Long term (current) use of insulin: Secondary | ICD-10-CM

## 2015-06-12 DIAGNOSIS — G4700 Insomnia, unspecified: Secondary | ICD-10-CM

## 2015-06-12 DIAGNOSIS — R0600 Dyspnea, unspecified: Secondary | ICD-10-CM | POA: Insufficient documentation

## 2015-06-12 DIAGNOSIS — J90 Pleural effusion, not elsewhere classified: Secondary | ICD-10-CM

## 2015-06-12 DIAGNOSIS — D649 Anemia, unspecified: Secondary | ICD-10-CM

## 2015-06-12 DIAGNOSIS — Z950 Presence of cardiac pacemaker: Secondary | ICD-10-CM

## 2015-06-12 LAB — RENAL FUNCTION PANEL
ANION GAP: 10 (ref 5–15)
Albumin: 3.4 g/dL — ABNORMAL LOW (ref 3.5–5.0)
BUN: 62 mg/dL — ABNORMAL HIGH (ref 6–20)
CO2: 25 mmol/L (ref 22–32)
CREATININE: 3.74 mg/dL — AB (ref 0.44–1.00)
Calcium: 9 mg/dL (ref 8.9–10.3)
Chloride: 103 mmol/L (ref 101–111)
GFR calc Af Amer: 12 mL/min — ABNORMAL LOW (ref >60)
GFR calc non Af Amer: 10 mL/min — ABNORMAL LOW (ref >60)
Glucose, Bld: 100 mg/dL — ABNORMAL HIGH (ref 65–99)
Phosphorus: 5.2 mg/dL — ABNORMAL HIGH (ref 2.5–4.6)
Potassium: 4.3 mmol/L (ref 3.5–5.1)
Sodium: 138 mmol/L (ref 135–145)

## 2015-06-12 LAB — PROTIME-INR
INR: 1.29 (ref 0.00–1.49)
Prothrombin Time: 16.3 seconds — ABNORMAL HIGH (ref 11.6–15.2)

## 2015-06-12 LAB — CBC
HEMATOCRIT: 30.3 % — AB (ref 36.0–46.0)
Hemoglobin: 9.3 g/dL — ABNORMAL LOW (ref 12.0–15.0)
MCH: 29.3 pg (ref 26.0–34.0)
MCHC: 30.7 g/dL (ref 30.0–36.0)
MCV: 95.6 fL (ref 78.0–100.0)
Platelets: 188 10*3/uL (ref 150–400)
RBC: 3.17 MIL/uL — ABNORMAL LOW (ref 3.87–5.11)
RDW: 14.3 % (ref 11.5–15.5)
WBC: 7.1 10*3/uL (ref 4.0–10.5)

## 2015-06-12 LAB — TECHNOLOGIST SMEAR REVIEW

## 2015-06-12 LAB — MAGNESIUM: MAGNESIUM: 2.2 mg/dL (ref 1.7–2.4)

## 2015-06-12 MED ORDER — SODIUM CHLORIDE 0.9 % IR SOLN
80.0000 mg | Status: DC
  Filled 2015-06-12: qty 2

## 2015-06-12 MED ORDER — CEFAZOLIN SODIUM-DEXTROSE 2-3 GM-% IV SOLR
2.0000 g | INTRAVENOUS | Status: AC
  Administered 2015-06-13: 2 g via INTRAVENOUS
  Filled 2015-06-12: qty 50

## 2015-06-12 MED ORDER — SODIUM CHLORIDE 0.9 % IV SOLN: INTRAVENOUS | Status: DC

## 2015-06-12 MED ORDER — DOCUSATE SODIUM 100 MG PO CAPS
100.0000 mg | ORAL_CAPSULE | Freq: Two times a day (BID) | ORAL | Status: DC
  Administered 2015-06-12 – 2015-06-15 (×6): 100 mg via ORAL
  Filled 2015-06-12 (×9): qty 1

## 2015-06-12 NOTE — Progress Notes (Signed)
Subjective:  Did well overnight. Device representative has reprogramed PM for maximal output now with no additional failure to capture. Patient with no acute complaints this morning.   Objective: Vital signs in last 24 hours: Filed Vitals:   06/12/15 0220 06/12/15 0420 06/12/15 0500 06/12/15 0738  BP: 105/42 142/66 138/72 150/70  Pulse: 58 66 74 67  Temp:   98.1 F (36.7 C) 98.2 F (36.8 C)  TempSrc:    Oral  Resp:  Height:      Weight:   177 lb 3.2 oz (80.377 kg)   SpO2: 100% 100% 100% 100%   Weight change:   Intake/Output Summary (Last 24 hours) at 06/12/15 1016 Last data filed at 06/12/15 0845  Gross per 24 hour  Intake    600 ml  Output   1325 ml  Net   -725 ml    General: resting in bed, NAD HEENT: PERRL, EOMI, no scleral icterus, no JVD Cardiac: RRR, no rubs, murmurs or gallops Pulm: clear to auscultation bilaterally, moving normal volumes of air Abd: soft, nontender, nondistended, BS present Ext: warm and well perfused, no pedal edema Neuro: alert and oriented X3, cranial nerves II-XII grossly intact Skin: no rashes or lesions noted Psych: appropriate affect  Lab Results: Basic Metabolic Panel:  Recent Labs Lab 06/11/15 0813 06/12/15 0402  NA 142 138  K 4.6 4.3  CL 106 103  CO2 23 25  GLUCOSE 121* 100*  BUN 61* 62*  CREATININE 3.67* 3.74*  CALCIUM 9.7 9.0  MG  --  2.2  PHOS  --  5.2*   Liver Function Tests:  Recent Labs Lab 06/11/15 0813 06/12/15 0402  AST 51*  --   ALT 47  --   ALKPHOS 78  --   BILITOT 0.8  --   PROT 7.2  --   ALBUMIN 3.8 3.4*   No results for input(s): LIPASE, AMYLASE in the last 168 hours. No results for input(s): AMMONIA in the last 168 hours. CBC:  Recent Labs Lab 06/11/15 0813 06/12/15 0402  WBC 9.2 7.1  HGB 10.4* 9.3*  HCT 33.3* 30.3*  MCV 94.3 95.6  PLT 190 188   Cardiac Enzymes: No results for input(s): CKTOTAL, CKMB, CKMBINDEX, TROPONINI in the last 168 hours. BNP: No results for  input(s): PROBNP in the last 168 hours. D-Dimer: No results for input(s): DDIMER in the last 168 hours. CBG:  Recent Labs Lab 06/11/15 1210 06/11/15 2250  GLUCAP 141* 133*   Hemoglobin A1C: No results for input(s): HGBA1C in the last 168 hours. Fasting Lipid Panel: No results for input(s): CHOL, HDL, LDLCALC, TRIG, CHOLHDL, LDLDIRECT in the last 168 hours. Thyroid Function Tests: No results for input(s): TSH, T4TOTAL, FREET4, T3FREE, THYROIDAB in the last 168 hours. Coagulation:  Recent Labs Lab 06/12/15 0402  LABPROT 16.3*  INR 1.29   Anemia Panel: No results for input(s): VITAMINB12, FOLATE, FERRITIN, TIBC, IRON, RETICCTPCT in the last 168 hours. Urine Drug Screen: Drugs of Abuse  No results found for: LABOPIA, COCAINSCRNUR, LABBENZ, AMPHETMU, THCU, LABBARB  Alcohol Level: No results for input(s): ETH in the last 168 hours. Urinalysis:  Recent Labs Lab 06/11/15 1042  COLORURINE YELLOW  LABSPEC 1.009  PHURINE 5.0  GLUCOSEU NEGATIVE  HGBUR NEGATIVE  BILIRUBINUR NEGATIVE  KETONESUR NEGATIVE  PROTEINUR NEGATIVE  UROBILINOGEN 0.2  NITRITE NEGATIVE  LEUKOCYTESUR NEGATIVE   Micro Results: Recent Results (from the past 240 hour(s))  MRSA PCR Screening     Status: None  Collection Time: 06/11/15  5:02 PM  Result Value Ref Range Status   MRSA by PCR NEGATIVE NEGATIVE Final    Comment:        The GeneXpert MRSA Assay (FDA approved for NASAL specimens only), is one component of a comprehensive MRSA colonization surveillance program. It is not intended to diagnose MRSA infection nor to guide or monitor treatment for MRSA infections.    Studies/Results: Dg Chest 2 View  06/12/2015   CLINICAL DATA:  Dyspnea.  EXAM: CHEST  2 VIEW  COMPARISON:  1 day prior  FINDINGS: Dual lead pacer. External pacer/ defibrillator. Extensive artifact, primarily over the left side of the chest. Midline trachea. Cardiomegaly accentuated by AP portable technique. Small right pleural  effusion is similar. No pneumothorax. Low lung volumes. Pulmonary venous congestion is improved to resolved. Improved right base aeration.  IMPRESSION: Overall improved aeration with decreased to resolved pulmonary venous congestion.  Small right pleural effusion remains with improved adjacent airspace disease.  Extensive artifact degradation, primarily over the left hemi thorax.   Electronically Signed   By: Jeronimo Greaves M.D.   On: 06/12/2015 07:42   Dg Chest Port 1 View  06/11/2015   CLINICAL DATA:  Shortness of breath and cough for 2 days  EXAM: PORTABLE CHEST - 1 VIEW  COMPARISON:  05/12/2015  FINDINGS: Cardiac shadow remains enlarged. Pacing device is again seen. The left lung is well aerated. Persistent right-sided effusion and basilar atelectasis is seen. Mild vascular congestion remains. No bony abnormality is seen.  IMPRESSION: Persistent right basilar changes and vascular congestion.   Electronically Signed   By: Alcide Clever M.D.   On: 06/11/2015 07:36   Medications: I have reviewed the patient's current medications. Scheduled Meds: . aspirin EC  81 mg Oral q morning - 10a  . atorvastatin  40 mg Oral q morning - 10a  . carvedilol  12.5 mg Oral BID  . furosemide  40 mg Intravenous Once  . furosemide  80 mg Intravenous BID  . heparin  5,000 Units Subcutaneous 3 times per day  . hydrALAZINE  10 mg Oral BID  . isosorbide mononitrate  60 mg Oral Daily  . linagliptin  5 mg Oral Daily  . paricalcitol  1 mcg Oral Daily  . RENA-VITE RX  1 tablet Oral q morning - 10a  . sodium chloride  3 mL Intravenous Q12H   Continuous Infusions:  PRN Meds:.acetaminophen **OR** acetaminophen, albuterol, docusate sodium, loratadine, ondansetron **OR** ondansetron (ZOFRAN) IV, ramelteon Assessment/Plan: Principal Problem:   Acute on chronic combined systolic and diastolic congestive heart failure Active Problems:   Hyperlipidemia   Hypertensive heart disease   CAD S/P PTCA 1995   Intrinsic asthma   CKD  stage 5, GFR less than 15 ml/min   PVD (peripheral vascular disease)   Chronic combined systolic and diastolic heart failure   Type 2 diabetes mellitus with renal manifestations   Obesity (BMI 30-39.9)   Anemia due to pre-end-stage renal disease treated with erythropoietin   Complete heart block   Pacemaker-St Jude May 2016   Pacemaker lead malfunction   Cardiomyopathy-EF 40-45% by echo May 2016   Palliative care encounter  Acute decompensated congestive heart failure in setting of PM (complete heart block) loss of capture: Decent output since admission of around 1.2 L net on IV lasix. RV PM non-capture is a possible etiology and EP with plans for revision tomorrow. Device has now be reprogrammed for maximal output. Stage V CKD is also a complicating  factor as discussed below. Echocardiogram from May 2016 with ejection fraction of 40-45% with moderate mitral regurgitation and severe pulmonary hypertension with PASP 72. Patient is on Lasix 40 mg daily at home. -Lead revision planned for tomorrow. NPO after breakfast tomorrow.  -Lasix 80 mg twice a day IV (Stage V CKD) -Continue home aspirin 81 mg daily, atorvastatin 40 mg daily, Coreg 12.5 mg twice a day, Imdur 60 mg daily  Stage V chronic kidney disease: Creatinine stable at 3.74 s/p IV lasix diuresis.Patient is against dialysis. Creatinine is overall close to her baseline of 3.51 upon discharge from her May 2016 hospitalization. -Continue home Zemplar 1 g daily and rena-vite 1 mg daily.  Peripheral vascular disease: Status post femoral-tibial bypass graft in February 2015, femoral-popliteal bypass graft in August 2015, amputation of third toe in August 2015. No acute issues.  Hypertension: Mildly labile pressures. At home, patient is on hydralazine 10 mg twice a day, Coreg 12.5 mg twice a day, Imdur 60 mg daily. -Continue home regimen  Coronary artery disease: Status post prior angioplasty in 1995. After catheterization in 2005, medical  therapy recommended for multivessel disease. -Continue Coreg 12.5 mg twice a day, Imdur 60 mg daily, aspirin 81 mg daily  Normocytic anemia: Hemoglobin of 10.4 with an MCV of 94.3. Receives Aranesp injections as an outpatient. -Continue to monitor  Type 2 diabetes: Well controlled. At home, patient is on Januvia 25 mg daily and Humalog 5 units twice a day for blood sugars greater than 250. -Continue home Januvia (linagliptin as inpatient).  -Will monitor CBG  Insomnia: -Continue home melatonin  Diet: 2 gram sodium restriction Prophylaxis: Heparin 5000 u subq Code: Full  Dispo: Disposition is deferred at this time, awaiting improvement of current medical problems. Anticipated discharge in approximately 3 day(s).   The patient does have a current PCP Eliott Nine, MD) and does need an [facility] hospital follow-up appointment after discharge.  The patient does not have transportation limitations that hinder transportation to clinic appointments.   LOS: 1 day   Services Needed at time of discharge: Y = Yes, Blank = No PT:   OT:   RN:   Equipment:   Other:    Harold Barban, MD 06/12/2015, 10:16 AM

## 2015-06-12 NOTE — Progress Notes (Signed)
Daily Progress Note   Patient Name: Dorothy Bartlett       Date: 06/12/2015 DOB: 1926-12-26  Age: 81 y.o. MRN#: [id number] Attending Physician: Levert Feinstein, MD Primary Care Physician: Eliott Nine, MD Admit Date: 06/11/2015  Reason for Consultation/Follow-up: Establishing goals of care  Spoke to pt at length. Part of pt's initial response "no"  to dialysis has been that she has been told that she needs dialysis for 20+ years. So in some regards this conversation is "normal" to her. However she does recognize with worsening heart failure that this is changing. She is not completely opposed to dialysis at this point. She wants to discuss it with her children as well as issues such as code status. Her children support whaetver her wishes are. When I asked her what she will tell them she states " I don't know yet" She is feeling better today. She has outpatient follow up already established with cardiology, nephrology. Plan is to revise PM lead in am. . Will deliver MOST form to patient and family as a  means for her outpatient provider to complete.  Subjective: Pt feeling much better. Device reprogrammed now capturing. Pt conversant this am without dysnea.  Interval Events: PM reprogrammed Length of Stay: 1 day  Current Medications: Scheduled Meds:  . aspirin EC  81 mg Oral q morning - 10a  . atorvastatin  40 mg Oral q morning - 10a  . carvedilol  12.5 mg Oral BID  . furosemide  40 mg Intravenous Once  . furosemide  80 mg Intravenous BID  . heparin  5,000 Units Subcutaneous 3 times per day  . hydrALAZINE  10 mg Oral BID  . isosorbide mononitrate  60 mg Oral Daily  . linagliptin  5 mg Oral Daily  . paricalcitol  1 mcg Oral Daily  . RENA-VITE RX  1 tablet Oral q morning - 10a  . sodium chloride  3 mL Intravenous Q12H    Continuous Infusions:    PRN Meds: acetaminophen **OR** acetaminophen, albuterol, docusate sodium, loratadine, ondansetron **OR** ondansetron (ZOFRAN)  IV, ramelteon  Palliative Performance Scale: 50-60%     Vital Signs: BP 113/46 mmHg  Pulse 66  Temp(Src) 98 F (36.7 C) (Oral)  Resp 18  Ht 4\' 11"  (1.499 m)  Wt 80.377 kg (177 lb 3.2 oz)  BMI 35.77 kg/m2  SpO2 99% SpO2: SpO2: 99 % O2 Device: O2 Device: Nasal Cannula O2 Flow Rate: O2 Flow Rate (L/min): 2 L/min  Intake/output summary:  Intake/Output Summary (Last 24 hours) at 06/12/15 1246 Last data filed at 06/12/15 1100  Gross per 24 hour  Intake    600 ml  Output   1700 ml  Net  -1100 ml   LBM:   Baseline Weight: Weight: 79.697 kg (175 lb 11.2 oz) Most recent weight: Weight: 80.377 kg (177 lb 3.2 oz)  Physical Exam: General: Well nourished elderly female. He is alert and in no acute distress Resp: Minimi al work of breathing noted.  Cardiac: No external pacer pads. RRR Musculoskeletal: MAE x4           Additional Data Reviewed: Recent Labs     06/11/15  0813  06/12/15  0402  WBC  9.2  7.1  HGB  10.4*  9.3*  PLT  190  188  NA  142  138  BUN  61*  62*  CREATININE  3.67*  3.74*     Problem List:  Patient Active Problem List   Diagnosis  Date Noted  . Acute on chronic combined systolic and diastolic congestive heart failure 06/11/2015  . Pacemaker lead malfunction 06/11/2015  . Cardiomyopathy-EF 40-45% by echo May 2016 06/11/2015  . Palliative care encounter   . Onychomycosis 06/03/2015  . Pain in lower limb 06/03/2015  . Pulmonary HTN 05/15/2015  . Acute on chronic renal failure   . Pacemaker-St Jude May 2016   . Acute on chronic combined systolic and diastolic ACC/AHA stage C congestive heart failure   . Elevated troponin 05/09/2015  . Pre-syncope 05/09/2015  . Acute renal failure superimposed on stage 4 chronic kidney disease 05/08/2015  . 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 Complete heart block 05/08/2015  . Obesity (BMI 30-39.9)   . Old anterolateral myocardial infarction   . Type 2 diabetes  mellitus with renal manifestations   . Chronic combined systolic and diastolic heart failure 04/13/2014  . PVD (peripheral vascular disease) 02/16/2014  . CKD stage 5, GFR less than 15 ml/min 01/15/2014  . Intrinsic asthma 01/28/2008  . Hypertensive heart disease   . Hyperlipidemia   . CAD S/P PTCA 1995      Palliative Care Assessment & Plan    Code Status:  Full code  Goals of Care:  HD: Pt is reevaluating whether she would be willing to try HD. She is going to discuss with her family before making a final decision. Pt having lead reviision tomorrow  Desire for further Chaplaincy support: no  3. Symptom Management:  Dyspnea: Improved. Continue medical mgt per medical team.    4. Prognosis: Unable to determine. Could be variable depending on what she decides about HD in setting of worsening CHF  5. Discharge Planning: home. Pt already has outpatient nephrology, and cardiology providers     Thank you for allowing the Palliative Medicine Team to assist in the care of this patient. Palliative Care to follow up prior to discharge to see if she and her family have any questions regarding advanced care planning   Time In: 0800 Time Out: 0845 Total Time 45 min Prolonged Time Billed  no     Greater than 50%  of this time was spent counseling and coordinating care related to the above assessment and plan.   Irean Hong, NP  06/12/2015, 12:46 PM  Please contact Palliative Medicine Team phone at [phone] for questions and concerns.    2

## 2015-06-12 NOTE — Consult Note (Signed)
Reason for Consult: PM malfunction  Referring Physician: Dr. Evern Bartlett is an 81 y.o. female.   HPI:  The patient is a 81 yo woman with multiple medical problems who is admitted for sob and has been found to have non-capture of her RV lead. She has near ESRD and has refused HD in the past. Creatinine 3.5-4.0 range. She was found to have non-capture on tele. Her PPM has been reprogrammed to maximum output. She has had no additional failure to capture. Review of CXR demonstrates appropriate lead position. She does not have chest pain. She was started on lasix IV yesterday with improvement in her symptoms of dyspnea.   PMH: Past Medical History  Diagnosis Date  . Hyperlipidemia   . Carotid stenosis     Carotid US (12/2013): < 60% RCA, < 40% LICA.  [name] CKD (chronic kidney disease), stage IV     notes 02/22/2014; Dorothy Bartlett  . Hypertension   . Coronary artery disease     a. s/p MI 1995 tx w/ PTCA to Dx, Ramus, ap LAD;  b. LHC (11/2004):  Inferior HK, EF 60%, proximal LAD 50%, ostial D1 80%, mid D1 95%, mid LAD 50-60%, distal LAD at the apex 90%, OM1 occluded (fills late by left to left collaterals), RCA 75%. Medical therapy recommended.;  c. Lexiscan Myoview (12/2013):  Prior ant-lat scar with very small area of peri-infarct ischemia in ant wall, EF 40 (high risk)   . Obesity (BMI 30-39.9)   . 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 Type 2 diabetes mellitus with renal manifestations   . Intrinsic asthma 01/28/2008     Mild intermittant asthma, PFT's 01/28/08     . Old anterolateral myocardial infarction   . PVD (peripheral vascular disease) 02/16/2014    2015, right femoral peroneal bypass grafting and left femoral-tibial bypass grafting by Dr. Arbie Bartlett for rest pain as well as nonhealing ulcer on toe accompanied with toe amputation  Arteriogram October 2015 with patent graft on the right, 70% stenosis of graft on the left proximally.   . Chronic  combined systolic and diastolic heart failure 04/13/2014  . Complete heart block     a. 05/2015 s/p [facility] Assurity DR model [id number] dual chamber PPM (serial number [id number]) (Dorothy Bartlett).  [name] Hx of echocardiogram     a. Echo 5/16: dist septal, dist ant, dist inf and apical AK, EF 40-45%, mod MR, mild LAE, PASP 72    PSHX: Past Surgical History  Procedure Laterality Date  . Repair of nerve in left arm  Left 2000  . Cardiac catheterization  1995    w/ PTCA Diag 2nd MI  . Femoral-tibial bypass graft Left 02/24/2014    Procedure: BYPASS GRAFT FEMORAL-TIBIAL ARTERY;  Surgeon: Dorothy Bartlett;  Location: [facility] OR;  Service: Vascular;  Laterality: Left;  . Femoral-popliteal bypass graft Right 08/04/2014    Procedure: RIGHT FEMORAL-PERONEAL ARTERY BYPASS GRAFT;  Surgeon: Dorothy Bartlett;  Location: [facility] OR;  Service: Vascular;  Laterality: Right;  . Amputation Right 08/04/2014    Procedure: AMPUTATION DIGIT-RIGHT 3RD TOE;  Surgeon: Dorothy Bartlett;  Location: [facility] OR;  Service: Vascular;  Laterality: Right;  . Lower extremity angiogram Left 01/08/2014    Procedure: LOWER EXTREMITY ANGIOGRAM;  Surgeon: Dorothy Bartlett;  Location: [facility] CATH LAB;  Service: Cardiovascular;  Laterality: Left;  . Abdominal angiogram  01/08/2014    Procedure: ABDOMINAL ANGIOGRAM;  Surgeon: Dorothy Bartlett;  Location: [facility] CATH LAB;  Service: Cardiovascular;;  . Lower extremity angiogram Right 08/03/2014    Procedure: LOWER EXTREMITY ANGIOGRAM;  Surgeon: Dorothy Bartlett;  Location: [facility] CATH LAB;  Service: Cardiovascular;  Laterality: Right;  . Abdominal aortagram N/A 10/25/2014    Procedure: ABDOMINAL Ronny Flurry;  Surgeon: Dorothy Bartlett;  Location: [facility] CATH LAB;  Service: Cardiovascular;  Laterality: N/A;  . Ep implantable device N/A 05/10/2015    Procedure: Pacemaker Implant;  Surgeon: Dorothy Bartlett;  Location: [facility] INVASIVE CV LAB;  Service: Cardiovascular;  Laterality: N/A;    FAMHX: Family History    Problem Relation Age of Onset  . Diabetes Father   . Hypertension Father   . Hypertension Daughter   . Hyperlipidemia Daughter   . Hyperlipidemia Son   . Hypertension Sister     Social History:  reports that she has never smoked. She has never used smokeless tobacco. She reports that she does not drink alcohol or use illicit drugs.  Allergies: No Known Allergies  Medications: I have reviewed the patient's current medications.  Dg Chest 2 View  06/12/2015   CLINICAL DATA:  Dyspnea.  EXAM: CHEST  2 VIEW  COMPARISON:  1 day prior  FINDINGS: Dual lead pacer. External pacer/ defibrillator. Extensive artifact, primarily over the left side of the chest. Midline trachea. Cardiomegaly accentuated by AP portable technique. Small right pleural effusion is similar. No pneumothorax. Low lung volumes. Pulmonary venous congestion is improved to resolved. Improved right base aeration.  IMPRESSION: Overall improved aeration with decreased to resolved pulmonary venous congestion.  Small right pleural effusion remains with improved adjacent airspace disease.  Extensive artifact degradation, primarily over the left hemi thorax.   Electronically Signed   By: Dorothy Greaves M.D.   On: 06/12/2015 07:42   Dg Chest Port 1 View  06/11/2015   CLINICAL DATA:  Shortness of breath and cough for 2 days  EXAM: PORTABLE CHEST - 1 VIEW  COMPARISON:  05/12/2015  FINDINGS: Cardiac shadow remains enlarged. Pacing device is again seen. The left lung is well aerated. Persistent right-sided effusion and basilar atelectasis is seen. Mild vascular congestion remains. No bony abnormality is seen.  IMPRESSION: Persistent right basilar changes and vascular congestion.   Electronically Signed   By: Dorothy Clever M.D.   On: 06/11/2015 07:36    ROS  As stated in the HPI and negative for all other systems.  Physical Exam  Vitals:Blood pressure 150/70, pulse 67, temperature 98.2 F (36.8 C), temperature source Oral, resp. rate 18, height   (1.499 m), weight 177 lb 3.2 oz (80.377 kg), SpO2 100 %.  Well appearing NAD HEENT: Unremarkable Neck:  No JVD, no thyromegally Lymphatics:  No adenopathy Back:  No CVA tenderness Lungs:  Clear HEART:  Regular rate rhythm, no murmurs, no rubs, no clicks Abd:  Flat, positive bowel sounds, no organomegally, no rebound, no guarding Ext:  2 plus pulses, no edema, no cyanosis, no clubbing Skin:  No rashes no nodules Neuro:  CN II through XII intact, motor grossly intact  Tele - nsr with AV pacing and appropriate capture. Previously intermittant non-capture with pauses over 3 seconds  ECG - NSR with AV pacing  CXR - reviewed. Bilateral effusions.  Labs - reviewed   Assessment/Plan: 1. PM RV lead non-capture 2. Acute on chronic systolic and diastolic heart failure 3. Stage 5 renal failure, refusing to consider HD 4. HTN heart disease Rec: As she is  better with IV diuresis with minimal change in creatinine, I am not sure she is end stage. Will plan to revise her one month old PM system tomorrow with a new RV lead and removal of the old lead. Will let her eat breakfast then NPO.  Lewayne Bunting Bartlett 06/12/2015, 8:45 AM

## 2015-06-13 ENCOUNTER — Encounter (HOSPITAL_COMMUNITY)
Admission: EM | Disposition: A | Discharge status: Home Health | Payer: Medicare Other | Source: Home / Self Care | Attending: Internal Medicine

## 2015-06-13 ENCOUNTER — Ambulatory Visit (HOSPITAL_COMMUNITY): Admit: 2015-06-13 | Payer: Self-pay | Admitting: Internal Medicine

## 2015-06-13 DIAGNOSIS — I442 Atrioventricular block, complete: Secondary | ICD-10-CM

## 2015-06-13 DIAGNOSIS — T82110A Breakdown (mechanical) of cardiac electrode, initial encounter: Secondary | ICD-10-CM

## 2015-06-13 HISTORY — PX: PACEMAKER LEAD REMOVAL: SHX5064

## 2015-06-13 HISTORY — PX: EP IMPLANTABLE DEVICE: SHX172B

## 2015-06-13 LAB — BASIC METABOLIC PANEL
ANION GAP: 11 (ref 5–15)
BUN: 68 mg/dL — ABNORMAL HIGH (ref 6–20)
CALCIUM: 9.1 mg/dL (ref 8.9–10.3)
CO2: 27 mmol/L (ref 22–32)
CREATININE: 3.82 mg/dL — AB (ref 0.44–1.00)
Chloride: 102 mmol/L (ref 101–111)
GFR calc Af Amer: 11 mL/min — ABNORMAL LOW (ref >60)
GFR, EST NON AFRICAN AMERICAN: 10 mL/min — AB (ref >60)
Glucose, Bld: 91 mg/dL (ref 65–99)
Potassium: 4 mmol/L (ref 3.5–5.1)
Sodium: 140 mmol/L (ref 135–145)

## 2015-06-13 LAB — PARATHYROID HORMONE, INTACT (NO CA): PTH: 159 pg/mL — ABNORMAL HIGH (ref 15–65)

## 2015-06-13 LAB — VITAMIN D 25 HYDROXY (VIT D DEFICIENCY, FRACTURES): Vit D, 25-Hydroxy: 9.2 ng/mL — ABNORMAL LOW (ref 30.0–100.0)

## 2015-06-13 LAB — CBC
HCT: 28.7 % — ABNORMAL LOW (ref 36.0–46.0)
Hemoglobin: 8.9 g/dL — ABNORMAL LOW (ref 12.0–15.0)
MCH: 29.3 pg (ref 26.0–34.0)
MCHC: 31 g/dL (ref 30.0–36.0)
MCV: 94.4 fL (ref 78.0–100.0)
Platelets: 194 10*3/uL (ref 150–400)
RBC: 3.04 MIL/uL — ABNORMAL LOW (ref 3.87–5.11)
RDW: 14.2 % (ref 11.5–15.5)
WBC: 7.1 10*3/uL (ref 4.0–10.5)

## 2015-06-13 LAB — GLUCOSE, CAPILLARY: GLUCOSE-CAPILLARY: 133 mg/dL — AB (ref 65–99)

## 2015-06-13 SURGERY — LEAD REVISION/REPAIR
Anesthesia: LOCAL

## 2015-06-13 MED ORDER — FUROSEMIDE 40 MG PO TABS
40.0000 mg | ORAL_TABLET | Freq: Two times a day (BID) | ORAL | Status: DC
  Administered 2015-06-13 – 2015-06-15 (×5): 40 mg via ORAL
  Filled 2015-06-13 (×8): qty 1

## 2015-06-13 MED ORDER — SODIUM CHLORIDE 0.9 % IV SOLN: INTRAVENOUS | Status: AC

## 2015-06-13 MED ORDER — MIDAZOLAM HCL 5 MG/5ML IJ SOLN
INTRAMUSCULAR | Status: DC | PRN
  Administered 2015-06-13: 1 mg via INTRAVENOUS

## 2015-06-13 MED ORDER — LIDOCAINE HCL (PF) 1 % IJ SOLN
INTRAMUSCULAR | Status: DC | PRN
  Administered 2015-06-13: 30 mL via INTRADERMAL

## 2015-06-13 MED ORDER — MIDAZOLAM HCL 5 MG/5ML IJ SOLN
INTRAMUSCULAR | Status: AC
  Filled 2015-06-13: qty 5

## 2015-06-13 MED ORDER — ONDANSETRON HCL 4 MG/2ML IJ SOLN
4.0000 mg | Freq: Four times a day (QID) | INTRAMUSCULAR | Status: DC | PRN
Start: 2015-06-13 — End: 2015-06-15

## 2015-06-13 MED ORDER — LIDOCAINE HCL (PF) 1 % IJ SOLN
INTRAMUSCULAR | Status: AC
  Filled 2015-06-13: qty 60

## 2015-06-13 MED ORDER — ISOSORBIDE MONONITRATE ER 30 MG PO TB24: 30.0000 mg | ORAL_TABLET | Freq: Every day | ORAL | Status: DC

## 2015-06-13 MED ORDER — ACETAMINOPHEN 325 MG PO TABS: 325.0000 mg | ORAL_TABLET | ORAL | Status: DC | PRN

## 2015-06-13 MED ORDER — FUROSEMIDE 40 MG PO TABS: 40.0000 mg | ORAL_TABLET | Freq: Two times a day (BID) | ORAL | Status: DC

## 2015-06-13 MED ORDER — CEFAZOLIN SODIUM-DEXTROSE 2-3 GM-% IV SOLR
INTRAVENOUS | Status: AC
  Filled 2015-06-13: qty 50

## 2015-06-13 MED ORDER — FENTANYL CITRATE (PF) 100 MCG/2ML IJ SOLN
INTRAMUSCULAR | Status: AC
  Filled 2015-06-13: qty 2

## 2015-06-13 MED ORDER — PREDNISONE 20 MG PO TABS
40.0000 mg | ORAL_TABLET | Freq: Every day | ORAL | Status: DC
  Administered 2015-06-14 – 2015-06-15 (×2): 40 mg via ORAL
  Filled 2015-06-13 (×4): qty 2

## 2015-06-13 MED ORDER — SODIUM CHLORIDE 0.9 % IR SOLN
Status: AC
  Filled 2015-06-13: qty 2

## 2015-06-13 MED ORDER — VANCOMYCIN HCL IN DEXTROSE 1-5 GM/200ML-% IV SOLN
1000.0000 mg | Freq: Two times a day (BID) | INTRAVENOUS | Status: AC
  Administered 2015-06-14: 1000 mg via INTRAVENOUS
  Filled 2015-06-13: qty 200

## 2015-06-13 SURGICAL SUPPLY — 6 items
CABLE SURGICAL S-101-97-12 (CABLE) ×1 IMPLANT
LEAD TENDRIL SDX 1688TC-52CM (Lead) ×1 IMPLANT
PAD DEFIB LIFELINK (PAD) ×1 IMPLANT
SHEATH CLASSIC 7F (SHEATH) ×1 IMPLANT
TRAY PACEMAKER INSERTION (CUSTOM PROCEDURE TRAY) ×1 IMPLANT
WIRE HI TORQ VERSACORE-J 145CM (WIRE) ×1 IMPLANT

## 2015-06-13 NOTE — Progress Notes (Signed)
Subjective:  She continues to do well. No longer any requirement for oxygen supplementation. She is planned for a revision of her pacemaker lead today by electrophysiology.  Objective: Vital signs in last 24 hours: Filed Vitals:   06/13/15 0024 06/13/15 0400 06/13/15 0722 06/13/15 1101  BP: 123/44 130/49 136/58 106/37  Pulse: 77 72 66 67  Temp: 98.9 F (37.2 C) 98.5 F (36.9 C) 99 F (37.2 C) 98.7 F (37.1 C)  TempSrc:   Oral Oral  Resp: 17 19 18 20   Height:      Weight:  178 lb 12.8 oz (81.103 kg)    SpO2: 97% 95% 96% 95%   Weight change: 3 lb 1.6 oz (1.406 kg)  Intake/Output Summary (Last 24 hours) at 06/13/15 1157 Last data filed at 06/13/15 1100  Gross per 24 hour  Intake    600 ml  Output   1803 ml  Net  -1203 ml    General: resting in bed, NAD, breathing well on room air HEENT: PERRL, EOMI, no scleral icterus, no JVD Cardiac: RRR, no rubs, murmurs or gallops Pulm: clear to auscultation bilaterally, moving normal volumes of air Abd: soft, nontender, nondistended, BS present Ext: warm and well perfused, no pedal edema Neuro: alert and oriented X3, cranial nerves II-XII grossly intact Skin: no rashes or lesions noted Psych: appropriate affect  Lab Results: Basic Metabolic Panel:  Recent Labs Lab 06/12/15 0402 06/13/15 0422  NA 138 140  K 4.3 4.0  CL 103 102  CO2 25 27  GLUCOSE 100* 91  BUN 62* 68*  CREATININE 3.74* 3.82*  CALCIUM 9.0 9.1  MG 2.2  --   PHOS 5.2*  --    Liver Function Tests:  Recent Labs Lab 06/11/15 0813 06/12/15 0402  AST 51*  --   ALT 47  --   ALKPHOS 78  --   BILITOT 0.8  --   PROT 7.2  --   ALBUMIN 3.8 3.4*   No results for input(s): LIPASE, AMYLASE in the last 168 hours. No results for input(s): AMMONIA in the last 168 hours. CBC:  Recent Labs Lab 06/12/15 0402 06/13/15 0422  WBC 7.1 7.1  HGB 9.3* 8.9*  HCT 30.3* 28.7*  MCV 95.6 94.4  PLT 188 194   Cardiac Enzymes: No results for input(s): CKTOTAL,  CKMB, CKMBINDEX, TROPONINI in the last 168 hours. BNP: No results for input(s): PROBNP in the last 168 hours. D-Dimer: No results for input(s): DDIMER in the last 168 hours. CBG:  Recent Labs Lab 06/11/15 1210 06/11/15 2250  GLUCAP 141* 133*   Hemoglobin A1C: No results for input(s): HGBA1C in the last 168 hours. Fasting Lipid Panel: No results for input(s): CHOL, HDL, LDLCALC, TRIG, CHOLHDL, LDLDIRECT in the last 168 hours. Thyroid Function Tests: No results for input(s): TSH, T4TOTAL, FREET4, T3FREE, THYROIDAB in the last 168 hours. Coagulation:  Recent Labs Lab 06/12/15 0402  LABPROT 16.3*  INR 1.29   Anemia Panel: No results for input(s): VITAMINB12, FOLATE, FERRITIN, TIBC, IRON, RETICCTPCT in the last 168 hours. Urine Drug Screen: Drugs of Abuse  No results found for: LABOPIA, COCAINSCRNUR, LABBENZ, AMPHETMU, THCU, LABBARB  Alcohol Level: No results for input(s): ETH in the last 168 hours. Urinalysis:  Recent Labs Lab 06/11/15 1042  COLORURINE YELLOW  LABSPEC 1.009  PHURINE 5.0  GLUCOSEU NEGATIVE  HGBUR NEGATIVE  BILIRUBINUR NEGATIVE  KETONESUR NEGATIVE  PROTEINUR NEGATIVE  UROBILINOGEN 0.2  NITRITE NEGATIVE  LEUKOCYTESUR NEGATIVE   Micro Results: Recent Results (from  the past 240 hour(s))  MRSA PCR Screening     Status: None   Collection Time: 06/11/15  5:02 PM  Result Value Ref Range Status   MRSA by PCR NEGATIVE NEGATIVE Final    Comment:        The GeneXpert MRSA Assay (FDA approved for NASAL specimens only), is one component of a comprehensive MRSA colonization surveillance program. It is not intended to diagnose MRSA infection nor to guide or monitor treatment for MRSA infections.    Studies/Results: Dg Chest 2 View  06/12/2015   CLINICAL DATA:  Dyspnea.  EXAM: CHEST  2 VIEW  COMPARISON:  1 day prior  FINDINGS: Dual lead pacer. External pacer/ defibrillator. Extensive artifact, primarily over the left side of the chest. Midline  trachea. Cardiomegaly accentuated by AP portable technique. Small right pleural effusion is similar. No pneumothorax. Low lung volumes. Pulmonary venous congestion is improved to resolved. Improved right base aeration.  IMPRESSION: Overall improved aeration with decreased to resolved pulmonary venous congestion.  Small right pleural effusion remains with improved adjacent airspace disease.  Extensive artifact degradation, primarily over the left hemi thorax.   Electronically Signed   By: Jeronimo Greaves M.D.   On: 06/12/2015 07:42   Medications: I have reviewed the patient's current medications. Scheduled Meds: . aspirin EC  81 mg Oral q morning - 10a  . atorvastatin  40 mg Oral q morning - 10a  . carvedilol  12.5 mg Oral BID  .  ceFAZolin (ANCEF) IV  2 g Intravenous On Call  . docusate sodium  100 mg Oral BID  . furosemide  40 mg Oral BID  . gentamicin irrigation  80 mg Irrigation On Call  . heparin  5,000 Units Subcutaneous 3 times per day  . hydrALAZINE  10 mg Oral BID  . isosorbide mononitrate  60 mg Oral Daily  . linagliptin  5 mg Oral Daily  . paricalcitol  1 mcg Oral Daily  . [START ON 06/14/2015] predniSONE  40 mg Oral Q breakfast  . RENA-VITE RX  1 tablet Oral q morning - 10a  . sodium chloride  3 mL Intravenous Q12H   Continuous Infusions: . sodium chloride     PRN Meds:.acetaminophen **OR** acetaminophen, albuterol, loratadine, ondansetron **OR** ondansetron (ZOFRAN) IV, ramelteon Assessment/Plan: Principal Problem:   Acute on chronic combined systolic and diastolic congestive heart failure Active Problems:   Hyperlipidemia   Hypertensive heart disease   CAD S/P PTCA 1995   Intrinsic asthma   CKD stage 5, GFR less than 15 ml/min   PVD (peripheral vascular disease)   Chronic combined systolic and diastolic heart failure   Type 2 diabetes mellitus with renal manifestations   Obesity (BMI 30-39.9)   Anemia due to pre-end-stage renal disease treated with erythropoietin    Complete heart block   Pacemaker-St Jude May 2016   Pacemaker lead malfunction   Cardiomyopathy-EF 40-45% by echo May 2016   Palliative care encounter   Dyspnea   Pulmonary hypertension  Acute decompensated congestive heart failure in setting of PM (complete heart block) loss of capture: Continued net output of 1.7 L over the last 24 hours for a total of -2.5 L during this admission. Patient now no longer dyspneic and breathing comfortably on room air. RV PM non-capture is a possible etiology for initial presentation and EP with plans for revision today. Device has now be reprogrammed for maximal output now with no more significant loss of capture. Stage V CKD is also a complicating  factor as discussed below. Echocardiogram from May 2016 with ejection fraction of 40-45% with moderate mitral regurgitation and severe pulmonary hypertension with PASP 72. Patient is on Lasix 40 mg daily at home. -Lead revision planned for today. -Reduce Lasix to 40 mg by mouth (Stage V CKD) -Continue home aspirin 81 mg daily, atorvastatin 40 mg daily, Coreg 12.5 mg twice a day, Imdur 60 mg daily  Stage V chronic kidney disease: Slight uptrend in creatinine to 3.82 from 3.67 on initial presentation. Patient is against dialysis at this point, but still considering the option. Overall baseline of 3.51 upon discharge from her May 2016 hospitalization. -Continue home Zemplar 1 g daily and rena-vite 1 mg daily.  Peripheral vascular disease: Status post femoral-tibial bypass graft in February 2015, femoral-popliteal bypass graft in August 2015, amputation of third toe in August 2015. No acute issues.  Hypertension: Well controlled. At home, patient is on hydralazine 10 mg twice a day, Coreg 12.5 mg twice a day, Imdur 60 mg daily. -Continue home regimen  Coronary artery disease: Status post prior angioplasty in 1995. After catheterization in 2005, medical therapy recommended for multivessel disease. -Continue Coreg 12.5  mg twice a day, Imdur 60 mg daily, aspirin 81 mg daily  Normocytic anemia: Hemoglobin of 8.9, down from initial 10.4 on admission. Receives Aranesp injections as an outpatient. -Continue to monitor  Type 2 diabetes: Well controlled. At home, patient is on Januvia 25 mg daily and Humalog 5 units twice a day for blood sugars greater than 250. -Continue home Januvia (linagliptin as inpatient).  -Will monitor CBG  Insomnia: -Continue home melatonin  Diet: 2 gram sodium restriction Prophylaxis: Heparin 5000 u subq Code: Full  Dispo: Disposition pending lead revision.  The patient does have a current PCP Eliott Nine, MD) and does need an [facility] hospital follow-up appointment after discharge.  The patient does not have transportation limitations that hinder transportation to clinic appointments.   LOS: 2 days   Services Needed at time of discharge: Y = Yes, Blank = No PT:   OT:   RN:   Equipment:   Other:    Harold Barban, MD 06/13/2015, 11:57 AM

## 2015-06-13 NOTE — Progress Notes (Signed)
PT IS device dependent with newly implanted RV lead  She will be observed in the ICU overnight with pacing pads in place

## 2015-06-13 NOTE — Progress Notes (Signed)
Patient Profile:  81 y/o female with multiple medical problems who is admitted for sob and has been found to have non-capture of her RV lead. Also with a/c CHF and stage 5 renal failure (refuses to consider HD).  Subjective: Breathing improved but still with mild orthopnea. No chest pain, dizziness, syncope/ near syncope.   Objective: Vital signs in last 24 hours: Temp:  [98 F (36.7 C)-99 F (37.2 C)] 99 F (37.2 C) (06/13 0722) Pulse Rate:  [48-77] 66 (06/13 0722) Resp:  [15-19] 18 (06/13 0722) BP: (91-136)/(41-58) 136/58 mmHg (06/13 0722) SpO2:  [94 %-99 %] 96 % (06/13 0722) Weight:  [178 lb 12.8 oz (81.103 kg)] 178 lb 12.8 oz (81.103 kg) (06/13 0400) Last BM Date: 06/11/15  Intake/Output from previous day: 06/12 0701 - 06/13 0700 In: 900 [P.O.:900] Out: 2600 [Urine:2600] Intake/Output this shift:    Medications Current Facility-Administered Medications  Medication Dose Route Frequency Provider Last Rate Last Dose  . 0.9 %  sodium chloride infusion   Intravenous Continuous Marinus Maw, MD      . acetaminophen (TYLENOL) tablet 650 mg  650 mg Oral Q6H PRN Otis Brace, MD       Or  . acetaminophen (TYLENOL) suppository 650 mg  650 mg Rectal Q6H PRN Marjan Rabbani, MD      . albuterol (PROVENTIL HFA;VENTOLIN HFA) 108 (90 BASE) MCG/ACT inhaler 2 puff  2 puff Inhalation Q6H PRN Marjan Rabbani, MD      . aspirin EC tablet 81 mg  81 mg Oral q morning - 10a Marjan Rabbani, MD   81 mg at 06/12/15 0947  . atorvastatin (LIPITOR) tablet 40 mg  40 mg Oral q morning - 10a Marjan Rabbani, MD   40 mg at 06/12/15 0948  . carvedilol (COREG) tablet 12.5 mg  12.5 mg Oral BID Otis Brace, MD   12.5 mg at 06/12/15 2123  . ceFAZolin (ANCEF) IVPB 2 g/50 mL premix  2 g Intravenous On Call Marinus Maw, MD      . docusate sodium (COLACE) capsule 100 mg  100 mg Oral BID Harold Barban, MD   100 mg at 06/12/15 2124  . furosemide (LASIX) injection 40 mg  40 mg Intravenous Once Marjan  Rabbani, MD      . furosemide (LASIX) injection 80 mg  80 mg Intravenous BID Otis Brace, MD   80 mg at 06/12/15 1741  . gentamicin (GARAMYCIN) 80 mg in sodium chloride irrigation 0.9 % 500 mL irrigation  80 mg Irrigation On Call Marinus Maw, MD      . heparin injection 5,000 Units  5,000 Units Subcutaneous 3 times per day Otis Brace, MD   5,000 Units at 06/12/15 2124  . hydrALAZINE (APRESOLINE) tablet 10 mg  10 mg Oral BID Otis Brace, MD   10 mg at 06/12/15 2123  . isosorbide mononitrate (IMDUR) 24 hr tablet 60 mg  60 mg Oral Daily Marjan Rabbani, MD   60 mg at 06/12/15 0948  . linagliptin (TRADJENTA) tablet 5 mg  5 mg Oral Daily Marjan Rabbani, MD   5 mg at 06/12/15 0948  . loratadine (CLARITIN) tablet 10 mg  10 mg Oral Daily PRN Marjan Rabbani, MD      . ondansetron (ZOFRAN) tablet 4 mg  4 mg Oral Q6H PRN Marjan Rabbani, MD       Or  . ondansetron (ZOFRAN) injection 4 mg  4 mg Intravenous Q6H PRN Otis Brace, MD      .  paricalcitol (ZEMPLAR) capsule 1 mcg  1 mcg Oral Daily Marjan Rabbani, MD   1 mcg at 06/12/15 0950  . ramelteon (ROZEREM) tablet 8 mg  8 mg Oral Daily PRN Otis Brace, MD      . RENA-VITE RX TABS 1 tablet  1 tablet Oral q morning - 10a Otis Brace, MD   1 tablet at 06/12/15 0948  . sodium chloride 0.9 % injection 3 mL  3 mL Intravenous Q12H Otis Brace, MD   3 mL at 06/12/15 0951    PE: General appearance: alert, cooperative and no distress Neck: no carotid bruit and no JVD Lungs: clear to auscultation bilaterally Heart: regular rate and rhythm, S1, S2 normal, no murmur, click, rub or gallop Extremities: 1+ bilateral edema Pulses: 2+ and symmetric Skin: warm and dry Neurologic: Grossly normal  Lab Results:   Recent Labs  06/11/15 0813 06/12/15 0402 06/13/15 0422  WBC 9.2 7.1 7.1  HGB 10.4* 9.3* 8.9*  HCT 33.3* 30.3* 28.7*  PLT 190 188 194   BMET  Recent Labs  06/11/15 0813 06/12/15 0402 06/13/15 0422  NA 142 138 140  K 4.6 4.3  4.0  CL 106 103 102  CO2 23 25 27   GLUCOSE 121* 100* 91  BUN 61* 62* 68*  CREATININE 3.67* 3.74* 3.82*  CALCIUM 9.7 9.0 9.1   PT/INR  Recent Labs  06/12/15 0402  LABPROT 16.3*  INR 1.29    Assessment/Plan Principal Problem:   Acute on chronic combined systolic and diastolic congestive heart failure Active Problems:   Hyperlipidemia   Hypertensive heart disease   CAD S/P PTCA 1995   Intrinsic asthma   CKD stage 5, GFR less than 15 ml/min   PVD (peripheral vascular disease)   Chronic combined systolic and diastolic heart failure   Type 2 diabetes mellitus with renal manifestations   Obesity (BMI 30-39.9)   Anemia due to pre-end-stage renal disease treated with erythropoietin   Complete heart block   Pacemaker-St Jude May 2016   Pacemaker lead malfunction   Cardiomyopathy-EF 40-45% by echo May 2016   Palliative care encounter   Dyspnea   Pulmonary hypertension   1. PM RV Lead Non-Capture: No pauses on telemetry. HR stable. Plan lead revision today.   2. A/C CHF: EF 40-45%. Breathing improved but still with mild orthopnea. SCr continue to rise. Will defer further lasix dosing to MD.   3. Stage 5 Renal Failure: Scr 3.82. Patient refuses to consider HD.     LOS: 2 days    Dorothy Bartlett 06/13/2015 7:51 AM  SPOKE WITH Dr Fawn Kirk and the pt and her daugther about the option of doing nothing given progressive renal disease and the decision to not pursue dialysis as her rhythm at current output has not FAILED TO CAPTURE    At this point, notwithstanding, she would like to proceed with lead revision  We have reviewed risks of infection and perforation of lung and heart and possible lead issues(again)  She would like to proceed AND Her daughter is in agreement  Tel demonstrates some narrow QRS so dont know if her CHB is intermittent or not  If not then will place a new lead and remove the old, if yes and with escape will possibly use the old lead for access

## 2015-06-14 ENCOUNTER — Encounter (HOSPITAL_COMMUNITY): Payer: Self-pay | Admitting: Internal Medicine

## 2015-06-14 ENCOUNTER — Inpatient Hospital Stay (HOSPITAL_COMMUNITY): Payer: Medicare Other

## 2015-06-14 DIAGNOSIS — E559 Vitamin D deficiency, unspecified: Secondary | ICD-10-CM

## 2015-06-14 DIAGNOSIS — N2581 Secondary hyperparathyroidism of renal origin: Secondary | ICD-10-CM

## 2015-06-14 DIAGNOSIS — I442 Atrioventricular block, complete: Secondary | ICD-10-CM

## 2015-06-14 DIAGNOSIS — Z9889 Other specified postprocedural states: Secondary | ICD-10-CM

## 2015-06-14 LAB — BASIC METABOLIC PANEL
Anion gap: 11 (ref 5–15)
BUN: 69 mg/dL — ABNORMAL HIGH (ref 6–20)
CHLORIDE: 101 mmol/L (ref 101–111)
CO2: 28 mmol/L (ref 22–32)
Calcium: 9.2 mg/dL (ref 8.9–10.3)
Creatinine, Ser: 3.82 mg/dL — ABNORMAL HIGH (ref 0.44–1.00)
GFR calc Af Amer: 11 mL/min — ABNORMAL LOW (ref >60)
GFR, EST NON AFRICAN AMERICAN: 10 mL/min — AB (ref >60)
Glucose, Bld: 125 mg/dL — ABNORMAL HIGH (ref 65–99)
Potassium: 3.9 mmol/L (ref 3.5–5.1)
SODIUM: 140 mmol/L (ref 135–145)

## 2015-06-14 LAB — CBC
HEMATOCRIT: 28.4 % — AB (ref 36.0–46.0)
Hemoglobin: 9 g/dL — ABNORMAL LOW (ref 12.0–15.0)
MCH: 29.9 pg (ref 26.0–34.0)
MCHC: 31.7 g/dL (ref 30.0–36.0)
MCV: 94.4 fL (ref 78.0–100.0)
Platelets: 179 10*3/uL (ref 150–400)
RBC: 3.01 MIL/uL — AB (ref 3.87–5.11)
RDW: 14.5 % (ref 11.5–15.5)
WBC: 6.9 10*3/uL (ref 4.0–10.5)

## 2015-06-14 MED ORDER — VITAMIN D (ERGOCALCIFEROL) 1.25 MG (50000 UNIT) PO CAPS
50000.0000 [IU] | ORAL_CAPSULE | ORAL | Status: DC
  Administered 2015-06-14: 50000 [IU] via ORAL
  Filled 2015-06-14: qty 1

## 2015-06-14 MED FILL — Gentamicin Sulfate Inj 40 MG/ML: INTRAMUSCULAR | Qty: 2 | Status: AC

## 2015-06-14 MED FILL — Sodium Chloride IV Soln 0.9%: INTRAVENOUS | Qty: 500 | Status: AC

## 2015-06-14 NOTE — Progress Notes (Signed)
Patient Profile: 81 y/o female with multiple medical problems who is admitted for sob and has been found to have non-capture of her RV lead. Also with a/c CHF and stage 5 renal failure (refuses to consider HD).  Subjective: No events overnight. Denies CP and dyspnea.   Objective: Vital signs in last 24 hours: Temp:  [98 F (36.7 C)-98.7 F (37.1 C)] 98.1 F (36.7 C) (06/14 0400) Pulse Rate:  [0-168] 61 (06/14 0600) Resp:  [0-47] 18 (06/14 0600) BP: (89-172)/(37-101) 146/58 mmHg (06/14 0600) SpO2:  [0 %-100 %] 98 % (06/14 0600) Weight:  [174 lb 3.2 oz (79.017 kg)] 174 lb 3.2 oz (79.017 kg) (06/14 0500) Last BM Date: 06/13/15  Intake/Output from previous day: 06/13 0701 - 06/14 0700 In: 455 [P.O.:255; IV Piggyback:200] Out: 1578 [Urine:1575; Stool:3] Intake/Output this shift:    Medications Current Facility-Administered Medications  Medication Dose Route Frequency Provider Last Rate Last Dose  . acetaminophen (TYLENOL) tablet 650 mg  650 mg Oral Q6H PRN Otis Brace, MD       Or  . acetaminophen (TYLENOL) suppository 650 mg  650 mg Rectal Q6H PRN Marjan Rabbani, MD      . albuterol (PROVENTIL HFA;VENTOLIN HFA) 108 (90 BASE) MCG/ACT inhaler 2 puff  2 puff Inhalation Q6H PRN Marjan Rabbani, MD      . aspirin EC tablet 81 mg  81 mg Oral q morning - 10a Marjan Rabbani, MD   81 mg at 06/13/15 0804  . atorvastatin (LIPITOR) tablet 40 mg  40 mg Oral q morning - 10a Marjan Rabbani, MD   40 mg at 06/13/15 0804  . carvedilol (COREG) tablet 12.5 mg  12.5 mg Oral BID Otis Brace, MD   12.5 mg at 06/13/15 2259  . docusate sodium (COLACE) capsule 100 mg  100 mg Oral BID Harold Barban, MD   100 mg at 06/13/15 2258  . furosemide (LASIX) tablet 40 mg  40 mg Oral BID Tasrif Ahmed, MD   40 mg at 06/13/15 1757  . heparin injection 5,000 Units  5,000 Units Subcutaneous 3 times per day Otis Brace, MD   5,000 Units at 06/14/15 6468  . hydrALAZINE (APRESOLINE) tablet 10 mg  10 mg Oral BID  Otis Brace, MD   10 mg at 06/13/15 2300  . isosorbide mononitrate (IMDUR) 24 hr tablet 60 mg  60 mg Oral Daily Marjan Rabbani, MD   60 mg at 06/13/15 0804  . linagliptin (TRADJENTA) tablet 5 mg  5 mg Oral Daily Marjan Rabbani, MD   5 mg at 06/13/15 0804  . loratadine (CLARITIN) tablet 10 mg  10 mg Oral Daily PRN Otis Brace, MD      . ondansetron (ZOFRAN) injection 4 mg  4 mg Intravenous Q6H PRN Duke Salvia, MD      . ondansetron [facility]) tablet 4 mg  4 mg Oral Q6H PRN Otis Brace, MD      . paricalcitol (ZEMPLAR) capsule 1 mcg  1 mcg Oral Daily Marjan Rabbani, MD   1 mcg at 06/13/15 1010  . predniSONE (DELTASONE) tablet 40 mg  40 mg Oral Q breakfast Harold Barban, MD      . ramelteon (ROZEREM) tablet 8 mg  8 mg Oral Daily PRN Otis Brace, MD      . RENA-VITE RX TABS 1 tablet  1 tablet Oral q morning - 10a Marjan Rabbani, MD   1 tablet at 06/13/15 1010  . sodium chloride 0.9 % injection 3 mL  3 mL  Intravenous Q12H Otis Brace, MD   3 mL at 06/13/15 2200    PE: General appearance: alert, cooperative and no distress Neck: no carotid bruit and no JVD Lungs: clear to auscultation bilaterally Heart: regular rate and rhythm, S1, S2 normal, no murmur, click, rub or gallop Extremities: no LEE Pulses: 2+ and symmetric Skin: warm and dry Neurologic: Grossly normal  Lab Results:   Recent Labs  06/12/15 0402 06/13/15 0422 06/14/15 0244  WBC 7.1 7.1 6.9  HGB 9.3* 8.9* 9.0*  HCT 30.3* 28.7* 28.4*  PLT 188 194 179   BMET  Recent Labs  06/12/15 0402 06/13/15 0422 06/14/15 0244  NA 138 140 140  K 4.3 4.0 3.9  CL 103 102 101  CO2 GLUCOSE 100* 91 125*  BUN 62* 68* 69*  CREATININE 3.74* 3.82* 3.82*  CALCIUM 9.0 9.1 9.2   PT/INR  Recent Labs  06/12/15 0402  LABPROT 16.3*  INR 1.29     Assessment/Plan  Principal Problem:   Acute on chronic combined systolic and diastolic congestive heart failure Active Problems:   Hyperlipidemia   Hypertensive  heart disease   CAD S/P PTCA 1995   Intrinsic asthma   CKD stage 5, GFR less than 15 ml/min   PVD (peripheral vascular disease)   Chronic combined systolic and diastolic heart failure   Type 2 diabetes mellitus with renal manifestations   Obesity (BMI 30-39.9)   Anemia due to pre-end-stage renal disease treated with erythropoietin   Complete heart block   Pacemaker-St Jude May 2016   Pacemaker lead malfunction   Cardiomyopathy-EF 40-45% by echo May 2016   Palliative care encounter   Dyspnea   Pulmonary hypertension   Day 1 s/p RV Lead Revision. Paced rhythm on telemetry. No chest pain or dyspnea. Device interrogation pending. Will order a portable CXR to assess lead placement and to r/o pneumothorax.    LOS: 3 days    Brittainy M. Sharol Harness, PA-C 06/14/2015 7:37 AM  Stable   Device funciton normal Would observe overnight as she is device dependent with home in am

## 2015-06-14 NOTE — Progress Notes (Signed)
Utilization Review Completed.  

## 2015-06-14 NOTE — Progress Notes (Signed)
Daily Progress Note   Patient Name: Dorothy Bartlett       Date: 06/14/2015 DOB: 12-19-1926  Age: 81 y.o. MRN#: [id number] Attending Physician: Earl Lagos, MD Primary Care Physician: Eliott Nine, MD Admit Date: 06/11/2015  Subjective:     Dorothy Bartlett is very pleasant and greets me with a smile. She tells me about how good her life has been and that she does not fear death. She also tells me that she has decided she does not want dialysis now. She says that she has been feeling good and able to live independently and has great support as her children are "always in and out." She has one of her children helping with her medical, finances, and one who does the cooking. 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    Length of Stay: 3 days  Current Medications: Scheduled Meds:  . aspirin EC  81 mg Oral q morning - 10a  . atorvastatin  40 mg Oral q morning - 10a  . carvedilol  12.5 mg Oral BID  . docusate sodium  100 mg Oral BID  . furosemide  40 mg Oral BID  . heparin  5,000 Units Subcutaneous 3 times per day  . hydrALAZINE  10 mg Oral BID  . isosorbide mononitrate  60 mg Oral Daily  . linagliptin  5 mg Oral Daily  . paricalcitol  1 mcg Oral Daily  . predniSONE  40 mg Oral Q breakfast  . RENA-VITE RX  1 tablet Oral q morning - 10a  . sodium chloride  3 mL Intravenous Q12H  . Vitamin D (Ergocalciferol)  50,000 Units Oral Q7 days    Continuous Infusions:    PRN Meds: acetaminophen **OR** acetaminophen, albuterol, loratadine, ondansetron (ZOFRAN) IV, ondansetron **OR** [DISCONTINUED] ondansetron (ZOFRAN) IV, ramelteon  Palliative Performance Scale: 50%     Vital Signs: BP 126/77 mmHg  Pulse 69  Temp(Src) 98.1 F (36.7 C)  (Oral)  Resp 22  Ht 4\' 11"  (1.499 m)  Wt 79.017 kg (174 lb 3.2 oz)  BMI 35.17 kg/m2  SpO2 97% SpO2: SpO2: 97 % O2 Device: O2 Device: Nasal Cannula O2 Flow Rate: O2 Flow Rate (L/min): 2 L/min  Intake/output summary:  Intake/Output Summary (Last 24 hours) at 06/14/15 1009 Last data filed at 06/14/15 0600  Gross per 24 hour  Intake    455 ml  Output   1326 ml  Net   -871 ml   LBM: Last BM Date: 06/13/15 Baseline Weight: Weight: 79.697 kg (175 lb 11.2 oz) Most recent weight: Weight: 79.017 kg (174 lb 3.2 oz)  Physical Exam: General: NAD, pleasant HEENT: [place]/AT, moist mucous membranes CVS: RRR Resp: No labored breathing Abd: Soft, NT, ND Neuro: Awake, alert, oriented x 3   Additional Data Reviewed: Recent Labs     06/13/15  0422  06/14/15  0244  WBC  7.1  6.9  HGB  8.9*  9.0*  PLT  194  179  NA  140  140  BUN  68*  69*  CREATININE  3.82*  3.82*     Problem List:  Patient Active Problem List   Diagnosis Date Noted  . Vitamin D deficiency 06/14/2015  . Secondary hyperparathyroidism 06/14/2015  .  Dyspnea   . Pulmonary hypertension   . Acute on chronic combined systolic and diastolic congestive heart failure 06/11/2015  . Pacemaker lead malfunction 06/11/2015  . Cardiomyopathy-EF 40-45% by echo May 2016 06/11/2015  . Palliative care encounter   . Onychomycosis 06/03/2015  . Pain in lower limb 06/03/2015  . Pulmonary HTN 05/15/2015  . Acute on chronic renal failure   . Pacemaker-St Jude May 2016   . Acute on chronic combined systolic and diastolic ACC/AHA stage C congestive heart failure   . Elevated troponin 05/09/2015  . Pre-syncope 05/09/2015  . Acute renal failure superimposed on stage 4 chronic kidney disease 05/08/2015  . 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 Complete heart block 05/08/2015  . Obesity (BMI 30-39.9)   . Old anterolateral myocardial infarction   . Type 2 diabetes mellitus with renal  manifestations   . Chronic combined systolic and diastolic heart failure 04/13/2014  . PVD (peripheral vascular disease) 02/16/2014  . CKD stage 5, GFR less than 15 ml/min 01/15/2014  . Intrinsic asthma 01/28/2008  . Hypertensive heart disease   . Hyperlipidemia   . CAD S/P PTCA 1995      Palliative Care Assessment & Plan    Code Status:  FULL - she is considering options  Goals of Care:  Treat the treatable. She does not wish for dialysis but does not give a firm no for the future.   3. Symptom Management:  Dyspnea: Improved. Continue medical mgt per medical team.   4. Prognosis: Difficult to say but poor  5. Discharge Planning: She is planning to return home. Children are very supportive.    Thank you for allowing the Palliative Medicine Team to assist in the care of this patient.   Time In: 0920 Time Out: 1000 Total Time Prolonged Time Billed  no     Greater than 50%  of this time was spent counseling and coordinating care related to the above assessment and plan.     Yong Channel, NP Palliative Medicine Team Pager # [phone] (M-F 8a-5p) Team Phone # [phone] (Nights/Weekends)  06/14/2015, 10:09 AM

## 2015-06-14 NOTE — Progress Notes (Signed)
Subjective:  Pt seen and examined in AM. No acute events overnight. Pt had revision of her right ventricular lead yesterday without complication. She is doing well and denies dyspnea, chest pain, palpitations, or LE edema.  She made 1.6 L of urine output yesterday.    Objective: Vital signs in last 24 hours: Filed Vitals:   06/14/15 0500 06/14/15 0600 06/14/15 0752 06/14/15 0932  BP:  146/58 153/59 126/77  Pulse:  61 69   Temp:   98.1 F (36.7 C)   TempSrc:   Oral   Resp:  18 22   Height:      Weight: 174 lb 3.2 oz (79.017 kg)     SpO2:  98% 97%    Weight change: -4 lb 9.6 oz (-2.087 kg)  Intake/Output Summary (Last 24 hours) at 06/14/15 1106 Last data filed at 06/14/15 0600  Gross per 24 hour  Intake    350 ml  Output   1325 ml  Net   -975 ml   PHYSICAL EXAMINATION: General: Resting in bed, NAD HEENT: PERRL, EOMI, no scleral icterus, no JVD Cardiac: RRR, no rubs, murmurs or gallops Pulm: Clear to auscultation bilaterally, moving normal volumes of air Abd: Soft, nontender, nondistended, BS present Ext: Warm and well perfused, no pedal edema Neuro: Alert and oriented X3, cranial nerves II-XII grossly intact Skin: No rashes or lesions noted Psych: Appropriate affect  Lab Results: Basic Metabolic Panel:  Recent Labs Lab 06/12/15 0402 06/13/15 0422 06/14/15 0244  NA 138 140 140  K 4.3 4.0 3.9  CL 103 102 101  CO2 25 27 28   GLUCOSE 100* 91 125*  BUN 62* 68* 69*  CREATININE 3.74* 3.82* 3.82*  CALCIUM 9.0 9.1 9.2  MG 2.2  --   --   PHOS 5.2*  --   --    Liver Function Tests:  Recent Labs Lab 06/11/15 0813 06/12/15 0402  AST 51*  --   ALT 47  --   ALKPHOS 78  --   BILITOT 0.8  --   PROT 7.2  --   ALBUMIN 3.8 3.4*   CBC:  Recent Labs Lab 06/13/15 0422 06/14/15 0244  WBC 7.1 6.9  HGB 8.9* 9.0*  HCT 28.7* 28.4*  MCV 94.4 94.4  PLT 194 179   CBG:  Recent Labs Lab 06/11/15 1210 06/11/15 2250 06/13/15 1525  GLUCAP 141* 133* 133*    Coagulation:  Recent Labs Lab 06/12/15 0402  LABPROT 16.3*  INR 1.29    Urinalysis:  Recent Labs Lab 06/11/15 1042  COLORURINE YELLOW  LABSPEC 1.009  PHURINE 5.0  GLUCOSEU NEGATIVE  HGBUR NEGATIVE  BILIRUBINUR NEGATIVE  KETONESUR NEGATIVE  PROTEINUR NEGATIVE  UROBILINOGEN 0.2  NITRITE NEGATIVE  LEUKOCYTESUR NEGATIVE   Micro Results: Recent Results (from the past 240 hour(s))  MRSA PCR Screening     Status: None   Collection Time: 06/11/15  5:02 PM  Result Value Ref Range Status   MRSA by PCR NEGATIVE NEGATIVE Final    Comment:        The GeneXpert MRSA Assay (FDA approved for NASAL specimens only), is one component of a comprehensive MRSA colonization surveillance program. It is not intended to diagnose MRSA infection nor to guide or monitor treatment for MRSA infections.    Studies/Results: Dg Chest 2 View  06/14/2015   CLINICAL DATA:  Post pacemaker placement yesterday, dyspnea  EXAM: CHEST  2 VIEW  COMPARISON:  06/12/2015  FINDINGS: Cardiomegaly is noted. Central mild vascular congestion without  convincing pulmonary edema. Small bilateral pleural effusion with basilar atelectasis. No segmental infiltrate. There is 3 leads cardiac pacemaker with left subclavian approach. There is 1 lead in right atrium and 2 ventricular leads. No pneumothorax.  IMPRESSION: Central mild vascular congestion without convincing pulmonary edema. Small bilateral pleural effusion with basilar atelectasis. No segmental infiltrate. There is 3 leads cardiac pacemaker with left subclavian approach. There is 1 lead in right atrium and 2 ventricular leads. No pneumothorax.   Electronically Signed   By: Natasha Mead M.D.   On: 06/14/2015 08:59   Medications: I have reviewed the patient's current medications. Scheduled Meds: . aspirin EC  81 mg Oral q morning - 10a  . atorvastatin  40 mg Oral q morning - 10a  . carvedilol  12.5 mg Oral BID  . docusate sodium  100 mg Oral BID  . furosemide   40 mg Oral BID  . heparin  5,000 Units Subcutaneous 3 times per day  . hydrALAZINE  10 mg Oral BID  . isosorbide mononitrate  60 mg Oral Daily  . linagliptin  5 mg Oral Daily  . paricalcitol  1 mcg Oral Daily  . predniSONE  40 mg Oral Q breakfast  . RENA-VITE RX  1 tablet Oral q morning - 10a  . sodium chloride  3 mL Intravenous Q12H  . Vitamin D (Ergocalciferol)  50,000 Units Oral Q7 days   Continuous Infusions:   PRN Meds:.acetaminophen **OR** acetaminophen, albuterol, loratadine, ondansetron (ZOFRAN) IV, ondansetron **OR** [DISCONTINUED] ondansetron (ZOFRAN) IV, ramelteon Assessment/Plan:   Acute on chronic combined CHF in setting of PM (complete heart block) loss of capture s/p RV Lead revision on 06/13/15:  Pt s/p RV lead revision yesterday with no complications. CXR with no pneumothorax and 3 leads in appropriate location. Pt with net output of 1.6 L yesterday and total of -3.4 L during this admission with 1 lb wt loss. Last  2D-echo from May 2016 with EF of 40-45% with moderate mitral regurgitation and severe pulmonary hypertension with PASP 72. Patient is on Lasix 40 mg daily at home.  -Device interrogation today  -Continue PO Lasix 40 mg BID and discharge  (pt was on 40 mg daily at home) -Continue home coreg 12.5 mg twice a day and imdur 60 mg daily -If stable and ok with cardiology, plan to discharge home today   Stage V chronic kidney disease: Cr stable at 3.82 unchanged from yesterday. Patient is against dialysis at this point, but still considering the option. Overall baseline of 3.51 upon discharge from her May 2016 hospitalization. -Continue home Zemplar 1 g daily and rena-vite 1 mg daily.  Vitamin D deficiency in setting of Secondary Hyperparathyroidism - PTH elevated at 159 with 25-OH vitamin D level of 9.2 -Start ergocalciferol 50K U weekly for 8 weeks  -Repeat 25-OH vitamin D level after 8 weeks   Peripheral vascular disease: Status post femoral-tibial bypass graft in  February 2015, femoral-popliteal bypass graft in August 2015, amputation of third toe in August 2015. No acute issues.  Hypertension: Well controlled. At home, patient is on hydralazine 10 mg twice a day, Coreg 12.5 mg twice a day, Imdur 60 mg daily. -Continue home regimen  Coronary artery disease: Status post prior angioplasty in 1995. After catheterization in 2005, medical therapy recommended for multivessel disease. -Continue Coreg 12.5 mg twice a day, Imdur 60 mg daily, aspirin 81 mg daily  Normocytic anemia: Hemoglobin of 8.9 near baseline 9-10 with no active bleeding.  Last anemia panel on  05/12/15 with ACD. Pt receives Aranesp injections as an outpatient. -Continue to monitor   Type 2 diabetes: Well controlled. Last A1c 5.8 on 05/08/15. At home, patient is on Januvia 25 mg daily and Humalog 5 units twice a day for blood sugars greater than 250. -Continue home Januvia (linagliptin as inpatient).  -Will monitor CBG  Insomnia: -Continue home melatonin  Diet: Heart healthy  Prophylaxis: Heparin 5000 u subq Code: Full  Dispo: Possibly today  The patient does have a current PCP Eliott Nine, MD) and does need an [facility] hospital follow-up appointment after discharge.  The patient does not have transportation limitations that hinder transportation to clinic appointments.   LOS: 3 days   Services Needed at time of discharge: Y = Yes, Blank = No PT:  No  OT:  No  RN:  No  Equipment:  None  Other:  None   Otis Brace, MD 06/14/2015, 11:06 AM

## 2015-06-14 NOTE — Progress Notes (Signed)
Patient seen and examined. Case d/w residents in detail. I agree with findings and plan as documented in Dr. Thornell Sartorius note.  Patient is doing well s/p lead revision of her PPM. Repeat CXR with no pneumothorax and leads in place. Will observe tonight and if no new issues would d/c home in AM  Would c/w lasix PO BID for now in the setting of acute on chronic CHF exacerbation. She is much improved today. Will monitor  Patient to follow up with her nephrologist as an outpatient. She is refusing HD at this time but is considering it as an option in the near future.

## 2015-06-15 DIAGNOSIS — N2581 Secondary hyperparathyroidism of renal origin: Secondary | ICD-10-CM

## 2015-06-15 DIAGNOSIS — E559 Vitamin D deficiency, unspecified: Secondary | ICD-10-CM

## 2015-06-15 MED ORDER — VITAMIN D (ERGOCALCIFEROL) 1.25 MG (50000 UNIT) PO CAPS: 50000.0000 [IU] | ORAL_CAPSULE | ORAL | Status: AC

## 2015-06-15 NOTE — Significant Event (Signed)
Patient already receiving PT/OT at home prior to this admission-spoke with MD Ngo over the phone who says patient can resume these therapies at home and wrote in orders for patient to d/c home. Reviewed discharge instructions with patient and daughter. All personal belongings (clothes) are taken home with patient. Patient taken via wheelchair to transportation by NT Pat. Hilary Pundt, Charity fundraiser.

## 2015-06-15 NOTE — Discharge Summary (Signed)
Name: Dorothy Bartlett MRN: [id number] DOB: 1926-04-02 81 y.o. PCP: Eliott Nine, MD  Date of Admission: 06/11/2015  6:58 AM Date of Discharge: 06/15/2015 Attending Physician: Earl Lagos, MD  Discharge Diagnosis: Principal Problem:   Acute on chronic combined systolic and diastolic congestive heart failure Active Problems:   Hyperlipidemia   Hypertensive heart disease   CAD S/P PTCA 1995   Intrinsic asthma   CKD stage 5, GFR less than 15 ml/min   PVD (peripheral vascular disease)   Chronic combined systolic and diastolic heart failure   Type 2 diabetes mellitus with renal manifestations   Obesity (BMI 30-39.9)   Anemia due to pre-end-stage renal disease treated with erythropoietin   Complete heart block   Pacemaker-St Jude May 2016   Pacemaker lead malfunction   Cardiomyopathy-EF 40-45% by echo May 2016   Palliative care encounter   Dyspnea   Pulmonary hypertension   Vitamin D deficiency   Secondary hyperparathyroidism  Discharge Medications:   Medication List    TAKE these medications        albuterol 108 (90 BASE) MCG/ACT inhaler  Commonly known as:  PROVENTIL HFA;VENTOLIN HFA  Inhale 2 puffs into the lungs every 6 (six) hours as needed for wheezing or shortness of breath.     aspirin 81 MG tablet  Take 81 mg by mouth every morning.     atorvastatin 40 MG tablet  Commonly known as:  LIPITOR  Take 40 mg by mouth every morning.     carvedilol 12.5 MG tablet  Commonly known as:  COREG  Take 1 tablet (12.5 mg total) by mouth 2 (two) times daily.     docusate sodium 100 MG capsule  Commonly known as:  COLACE  Take 100 mg by mouth 2 (two) times daily.     furosemide 40 MG tablet  Commonly known as:  LASIX  Take 1 tablet (40 mg total) by mouth daily.     hydrALAZINE 10 MG tablet  Commonly known as:  APRESOLINE  Take 1 tablet by mouth 2 (two) times daily.     insulin lispro 100 UNIT/ML injection  Commonly known as:  HUMALOG  Inject 5 Units  into the skin 2 (two) times daily as needed for high blood sugar. Only takes if CBG >250.     isosorbide mononitrate 30 MG 24 hr tablet  Commonly known as:  IMDUR  Take 30 mg by mouth daily.     loratadine 10 MG tablet  Commonly known as:  CLARITIN  Take 10 mg by mouth daily as needed for allergies.     Melatonin 3 MG Tabs  Take 3 mg by mouth at bedtime as needed (for sleep).     nitroGLYCERIN 0.4 MG SL tablet  Commonly known as:  NITROSTAT  Place 1 tablet (0.4 mg total) under the tongue every 5 (five) minutes x 3 doses as needed for chest pain.     paricalcitol 1 MCG capsule  Commonly known as:  ZEMPLAR  Take 1 mcg by mouth daily.     RENA-VITE RX 1 MG Tabs  Take 1 tablet by mouth every morning.     sitaGLIPtin 25 MG tablet  Commonly known as:  JANUVIA  Take 25 mg by mouth daily.     Vitamin D (Ergocalciferol) 50000 UNITS Caps capsule  Commonly known as:  DRISDOL  Take 1 capsule (50,000 Units total) by mouth every 7 (seven) days.        Disposition and follow-up:  Dorothy Bartlett was discharged from [facility] in Stable condition.  At the hospital follow up visit please address:  Acute on chronic combined CHF in setting of PM (complete heart block) loss of capture s/p RV Lead revision on 06/13/15:Patient has follow-up with cardiology for further management of her pacemaker.  Stage V chronic kidney disease: [facility] to contact patient regarding a follow-up appointment. Would revisit possibility of dialysis as patient is reconsidering the possibility.  Vitamin D deficiency in setting of Secondary Hyperparathyroidism - Started ergocalciferol 50K U weekly for 8 weeks. Repeat 25-OH vitamin D level after 8 weeks (around 08/15/2015)  2.  Labs / imaging needed at time of follow-up: vitamin D as above  3.  Pending labs/ test needing follow-up: none  Follow-up Appointments: Follow-up Information    Follow up with [facility] On 06/23/2015.     Why:  at 12Noon   Contact information:   [address] [place] [address]       Follow up with Eliott Nine, MD On 06/23/2015.   Specialty:  Family Medicine   Why:  1:00 pm   Contact information:   [address] [facility] [place] [address] [phone]       Discharge Instructions: Discharge Instructions    Call MD for:  difficulty breathing, headache or visual disturbances    Complete by:  As directed      Call MD for:  extreme fatigue    Complete by:  As directed      Call MD for:  hives    Complete by:  As directed      Call MD for:  persistant dizziness or light-headedness    Complete by:  As directed      Call MD for:  persistant nausea and vomiting    Complete by:  As directed      Call MD for:  redness, tenderness, or signs of infection (pain, swelling, redness, odor or green/yellow discharge around incision site)    Complete by:  As directed      Call MD for:  severe uncontrolled pain    Complete by:  As directed      Call MD for:  temperature >100.4    Complete by:  As directed      Diet - low sodium heart healthy    Complete by:  As directed      Increase activity slowly    Complete by:  As directed            Consultations: Treatment Team:  R[facility], MD Palliative [facility]  Procedures Performed:  Dg Chest 2 View  06/14/2015   CLINICAL DATA:  Post pacemaker placement yesterday, dyspnea  EXAM: CHEST  2 VIEW  COMPARISON:  06/12/2015  FINDINGS: Cardiomegaly is noted. Central mild vascular congestion without convincing pulmonary edema. Small bilateral pleural effusion with basilar atelectasis. No segmental infiltrate. There is 3 leads cardiac pacemaker with left subclavian approach. There is 1 lead in right atrium and 2 ventricular leads. No pneumothorax.  IMPRESSION: Central mild vascular congestion without convincing pulmonary edema. Small bilateral pleural effusion with  basilar atelectasis. No segmental infiltrate. There is 3 leads cardiac pacemaker with left subclavian approach. There is 1 lead in right atrium and 2 ventricular leads. No pneumothorax.   Electronically Signed   By: Natasha Mead M.D.   On: 06/14/2015 08:59   Dg Chest 2 View  06/12/2015  CLINICAL DATA:  Dyspnea.  EXAM: CHEST  2 VIEW  COMPARISON:  1 day prior  FINDINGS: Dual lead pacer. External pacer/ defibrillator. Extensive artifact, primarily over the left side of the chest. Midline trachea. Cardiomegaly accentuated by AP portable technique. Small right pleural effusion is similar. No pneumothorax. Low lung volumes. Pulmonary venous congestion is improved to resolved. Improved right base aeration.  IMPRESSION: Overall improved aeration with decreased to resolved pulmonary venous congestion.  Small right pleural effusion remains with improved adjacent airspace disease.  Extensive artifact degradation, primarily over the left hemi thorax.   Electronically Signed   By: Jeronimo Greaves M.D.   On: 06/12/2015 07:42   Dg Chest Port 1 View  06/11/2015   CLINICAL DATA:  Shortness of breath and cough for 2 days  EXAM: PORTABLE CHEST - 1 VIEW  COMPARISON:  05/12/2015  FINDINGS: Cardiac shadow remains enlarged. Pacing device is again seen. The left lung is well aerated. Persistent right-sided effusion and basilar atelectasis is seen. Mild vascular congestion remains. No bony abnormality is seen.  IMPRESSION: Persistent right basilar changes and vascular congestion.   Electronically Signed   By: Alcide Clever M.D.   On: 06/11/2015 07:36    2D Echo: none  RV lead revision:   Preop Dx: complete heart block with RV lead failure Postop Dx same/   Cx: none apparent   Procedure single lead insertion and aborted RV lead removal  After routine prep and drape, lidocaine was infiltrated in the prepectoral subclavicular region on the left side an incision was made and carried down to pocket. The device was  removed.  After this, we turned our attention to gaining accessm to the extrathoracic,left subclavian vein. This was accomplished without difficulty and without the aspiration of air or puncture of the artery. A single Venipuncture was accomplished; guidewire was placed and a 7 French sheath placed through which was passed an Banker lead serial [id number] .  The ventricular lead was manipulated to the right ventricular apex with a bipolar R wave was 35, the pacing impedance was 554, the threshold was 0.5 @ 0.4 msec    The lead was affixed to the prepectoral fascia   At this juncture we turned to removing the previously implanted RV passive lead. I had noted that there is already significant scarring enveloping leads and the pocket. A stylette was placed in the RV lead. Gentle traction and rotation resulted in movement of about 4 cm; however, this was accompanied by significant ventricular ectopy. I then tried to, by changing the stylette, move the lead back and forth to see if it could be freed up so eyes I could remove it freely. I was unable to do this and hence, I abandoned and the lead with the tip in the basilar portion of the RV. The lead was capped   His were then attached back to the previous pulse generator and the leads and the pulse generator secured the prepectoral fascia. The wound was closed in 2 layers in normal fashion following copious irrigation of the pocket with antibiotic containing saline solution. . The wound was then closed in 2 layers in normal fashion. The wound was washed dried and aDermabond dressing was applied  Needle count, sponge count and instrument counts were correct at the end of the procedure   EBL mininmal   Admission HPI:    Patient is an 81 year old woman with a history of combined congestive heart failure, complete heart block (status post dual-chamber pacemaker  in May 2016) peripheral vascular disease (status post multiple  bypass grafts), stage V chronic kidney disease, hypertension, coronary artery disease (status post PCI in 1994, adequate therapy for multivessel disease from cath in 2005), normocytic anemia, type 2 diabetes, insomnia who presents to the hospital for 3 days of dyspnea. At baseline, patient lives at home alone and is able to complete all of her ADLs by herself at home. She notes some dyspnea that is worse with exertion over the last several days with no associated chest pain. She denies any presyncope or syncope. She has not noticed any increase in her weight at home. She does note orthopnea with chronic lower extremity edema, but is not any worse than baseline. Otherwise, patient denies any fevers, chills, abdominal pain, constipation, diarrhea, dysuria, or hematuria.  Patient was recently admitted between 05/08/2015 and 05/15/2015 for syncope in the setting of complete heart block, now status post implantation of dual chamber pacemaker. That hospitalization was remarkable for fluid overload requiring aggressive diuresis as patient had refused dialysis. Since then, patient has been planned for a lead revision from electrophysiology clinic for intermittent capture. In the emergency department, patient was given Lasix 40 mg IV.  Hospital Course by problem list: Principal Problem:   Acute on chronic combined systolic and diastolic congestive heart failure Active Problems:   Hyperlipidemia   Hypertensive heart disease   CAD S/P PTCA 1995   Intrinsic asthma   CKD stage 5, GFR less than 15 ml/min   PVD (peripheral vascular disease)   Chronic combined systolic and diastolic heart failure   Type 2 diabetes mellitus with renal manifestations   Obesity (BMI 30-39.9)   Anemia due to pre-end-stage renal disease treated with erythropoietin   Complete heart block   Pacemaker-St Jude May 2016   Pacemaker lead malfunction   Cardiomyopathy-EF 40-45% by echo May 2016   Palliative care encounter   Dyspnea    Pulmonary hypertension   Vitamin D deficiency   Secondary hyperparathyroidism   Acute on chronic combined CHF in setting of PM (complete heart block) loss of capture s/p RV Lead revision on 06/13/15: Patient initially presented with progressive dyspnea for 3 days prior to the day of presentation. Chest x-ray with vascular congestion and BNP elevated at 2283. This is in the context of a recent admission for complete heart block status post dual-chamber pacemaker placement. Also, recent clinic visit planning for lead revision given intermittent capture. Upon presentation, several factors were concurrently contributing to decompensated heart failure. First, patient found to have loss of capture during certain periods, losing half of her beats. Additionally, patient with stage V chronic kidney disease fusing dialysis. Patient was started on Lasix 80 mg twice a day IV. Electrophysiology was consulted for further evaluation of lead revision and a lead revision was performed on 06/13/2015 with no more loss of capture. Patient diuresed a net negative of 3.4 L during her admission with a noted 1 pound weight loss. Patient's respiratory status improved by the day of discharge, requiring no supplemental oxygen. Patient was discharged home on a increased dose of Lasix (previously 40 mg daily and increase to 40 mg twice a day). Patient was continued on her home Correctol 0.5 mg twice a day and Imdur 60 mg daily. After the lead revision, patient's device was found to be functioning properly over a period of 2 days of observation. Patient has follow-up with cardiology for further management of her pacemaker.  Stage V chronic kidney disease: Patient's creatinine remained around her  previous baseline of 3.5 upon discharge from her May 2016 hospitalization. Very slight trend to 3.8. Patient continued to be against dialysis although she was willing to consider in the possibility in the near to moderate term future. Patient was  continued on home Zemplar 1 g daily and rena-vite 1 mg daily. [facility] to contact patient regarding a follow-up appointment. Would revisit possibility of dialysis as patient is reconsidering the possibility.  Vitamin D deficiency in setting of Secondary Hyperparathyroidism - PTH elevated at 159 with 25-OH vitamin D level of 9.2. Started ergocalciferol 50K U weekly for 8 weeks. Repeat 25-OH vitamin D level after 8 weeks (around 08/15/2015)  Peripheral vascular disease: Status post femoral-tibial bypass graft in February 2015, femoral-popliteal bypass graft in August 2015, amputation of third toe in August 2015. No acute issues.  Hypertension: Mildly hypertensive. Patient was continued on her home regimen of  hydralazine 10 mg twice a day, Coreg 12.5 mg twice a day, Imdur 60 mg daily.  Coronary artery disease: Status post prior angioplasty in 1995. After catheterization in 2005, medical therapy recommended for multivessel disease. Continued on Coreg 12.5 mg twice a day, Imdur 60 mg daily, aspirin 81 mg daily  Normocytic anemia: Hemoglobin of 8.9 near baseline 9-10 with no active bleeding. Last anemia panel on 05/12/15 with ACD. Pt receives Aranesp injections as an outpatient.  Type 2 diabetes: Well controlled. Last A1c 5.8 on 05/08/15. Continued on home Januvia 25 mg daily and Humalog 5 units twice a day for blood sugars greater than 250.  Insomnia: Continued home melatonin  Discharge Vitals:   BP 158/67 mmHg  Pulse 61  Temp(Src) 97.7 F (36.5 C) (Oral)  Resp 17  Ht 4\' 11"  (1.499 m)  Wt 174 lb (78.926 kg)  BMI 35.13 kg/m2  SpO2 97%  Discharge Labs:  No results found for this or any previous visit (from the past 24 hour(s)).  Signed: Harold Barban, MD 06/15/2015, 3:30 PM    Services Ordered on Discharge: none Equipment Ordered on Discharge: none

## 2015-06-15 NOTE — Progress Notes (Signed)
Patient seen and examined. Case d/w residents in detail. I agree with findings and plan as documented in Dr. Wyatt Haste note.  Patient feels well today. No SOB, no CP. She is s/p RV lead revision on 6/13 and stable for d/c home per  EP. CXR with no PTX and device has been interrogated and is functioning normally.  Would c/w lasix PO BID for now in the setting of acute on chronic CHF exacerbation. She will f/u with cardio and PCP as an outpatient  Patient to follow up with her nephrologist as an outpatient to further discuss need for HD and if she wants to pursue this as an option. Cr at baseline for now.   Will d/c patient home today

## 2015-06-15 NOTE — Progress Notes (Signed)
Subjective:  No acute events overnight. No loss of capture noted on telemetry. Patient denies any dyspnea, chest pain, palpitations.  Objective: Vital signs in last 24 hours: Filed Vitals:   06/15/15 0007 06/15/15 0300 06/15/15 0731 06/15/15 0759  BP: 147/48  160/56   Pulse: 66  65 65  Temp: 98.3 F (36.8 C) 97.5 F (36.4 C) 98.1 F (36.7 C)   TempSrc: Oral Oral Oral   Resp: Height:      Weight:  174 lb (78.926 kg)    SpO2: 96%  97% 99%   Weight change: -3.2 oz (-0.091 kg)  Intake/Output Summary (Last 24 hours) at 06/15/15 0805 Last data filed at 06/14/15 1300  Gross per 24 hour  Intake    360 ml  Output    400 ml  Net    -40 ml   PHYSICAL EXAMINATION: General: Resting in bed, NAD HEENT: PERRL, EOMI, no scleral icterus, no JVD Cardiac: RRR, no rubs, murmurs or gallops Pulm: Clear to auscultation bilaterally, moving normal volumes of air Abd: Soft, nontender, nondistended, BS present Ext: Warm and well perfused, 1+ pitting edema in right lower extremity Neuro: Alert and oriented X3, cranial nerves II-XII grossly intact Skin: No rashes or lesions noted Psych: Appropriate affect  Lab Results: Basic Metabolic Panel:  Recent Labs Lab 06/12/15 0402 06/13/15 0422 06/14/15 0244  NA 138 140 140  K 4.3 4.0 3.9  CL 103 102 101  CO2 GLUCOSE 100* 91 125*  BUN 62* 68* 69*  CREATININE 3.74* 3.82* 3.82*  CALCIUM 9.0 9.1 9.2  MG 2.2  --   --   PHOS 5.2*  --   --    Liver Function Tests:  Recent Labs Lab 06/11/15 0813 06/12/15 0402  AST 51*  --   ALT 47  --   ALKPHOS 78  --   BILITOT 0.8  --   PROT 7.2  --   ALBUMIN 3.8 3.4*   CBC:  Recent Labs Lab 06/13/15 0422 06/14/15 0244  WBC 7.1 6.9  HGB 8.9* 9.0*  HCT 28.7* 28.4*  MCV 94.4 94.4  PLT 194 179   CBG:  Recent Labs Lab 06/11/15 1210 06/11/15 2250 06/13/15 1525  GLUCAP 141* 133* 133*   Coagulation:  Recent Labs Lab 06/12/15 0402  LABPROT 16.3*  INR 1.29     Urinalysis:  Recent Labs Lab 06/11/15 1042  COLORURINE YELLOW  LABSPEC 1.009  PHURINE 5.0  GLUCOSEU NEGATIVE  HGBUR NEGATIVE  BILIRUBINUR NEGATIVE  KETONESUR NEGATIVE  PROTEINUR NEGATIVE  UROBILINOGEN 0.2  NITRITE NEGATIVE  LEUKOCYTESUR NEGATIVE   Micro Results: Recent Results (from the past 240 hour(s))  MRSA PCR Screening     Status: None   Collection Time: 06/11/15  5:02 PM  Result Value Ref Range Status   MRSA by PCR NEGATIVE NEGATIVE Final    Comment:        The GeneXpert MRSA Assay (FDA approved for NASAL specimens only), is one component of a comprehensive MRSA colonization surveillance program. It is not intended to diagnose MRSA infection nor to guide or monitor treatment for MRSA infections.    Studies/Results: Dg Chest 2 View  06/14/2015   CLINICAL DATA:  Post pacemaker placement yesterday, dyspnea  EXAM: CHEST  2 VIEW  COMPARISON:  06/12/2015  FINDINGS: Cardiomegaly is noted. Central mild vascular congestion without convincing pulmonary edema. Small bilateral pleural effusion with basilar atelectasis. No segmental infiltrate. There is 3 leads cardiac pacemaker  with left subclavian approach. There is 1 lead in right atrium and 2 ventricular leads. No pneumothorax.  IMPRESSION: Central mild vascular congestion without convincing pulmonary edema. Small bilateral pleural effusion with basilar atelectasis. No segmental infiltrate. There is 3 leads cardiac pacemaker with left subclavian approach. There is 1 lead in right atrium and 2 ventricular leads. No pneumothorax.   Electronically Signed   By: Natasha Mead M.D.   On: 06/14/2015 08:59   Medications: I have reviewed the patient's current medications. Scheduled Meds: . aspirin EC  81 mg Oral q morning - 10a  . atorvastatin  40 mg Oral q morning - 10a  . carvedilol  12.5 mg Oral BID  . docusate sodium  100 mg Oral BID  . furosemide  40 mg Oral BID  . heparin  5,000 Units Subcutaneous 3 times per day  .  hydrALAZINE  10 mg Oral BID  . isosorbide mononitrate  60 mg Oral Daily  . linagliptin  5 mg Oral Daily  . paricalcitol  1 mcg Oral Daily  . predniSONE  40 mg Oral Q breakfast  . RENA-VITE RX  1 tablet Oral q morning - 10a  . sodium chloride  3 mL Intravenous Q12H  . Vitamin D (Ergocalciferol)  50,000 Units Oral Q7 days   Continuous Infusions:   PRN Meds:.acetaminophen **OR** acetaminophen, albuterol, loratadine, ondansetron (ZOFRAN) IV, ondansetron **OR** [DISCONTINUED] ondansetron (ZOFRAN) IV, ramelteon Assessment/Plan:   Acute on chronic combined CHF in setting of PM (complete heart block) loss of capture s/p RV Lead revision on 06/13/15:  Postop day 2 status post right ventricular lead revision with no complications. CXR with no pneumothorax and 3 leads in appropriate location. Pt with net neutral over the last 24 hours with -3.4 L during this admission with 1 lb wt loss. Last  2D-echo from May 2016 with EF of 40-45% with moderate mitral regurgitation and severe pulmonary hypertension with PASP 72. Patient is on Lasix 40 mg daily at home.  -Continue PO Lasix 40 mg BID and discharge  (pt was on 40 mg daily at home) -Continue home coreg 12.5 mg twice a day and imdur 60 mg daily -If stable and ok with cardiology, plan to discharge home today   Stage V chronic kidney disease: Stabilized that around 3.8 during this admission. Patient is against dialysis at this point, but still considering the option. Overall baseline of 3.51 upon discharge from her May 2016 hospitalization. -Continue home Zemplar 1 g daily and rena-vite 1 mg daily.  Vitamin D deficiency in setting of Secondary Hyperparathyroidism - PTH elevated at 159 with 25-OH vitamin D level of 9.2 -Start ergocalciferol 50K U weekly for 8 weeks  -Repeat 25-OH vitamin D level after 8 weeks (around 08/15/2015)  Peripheral vascular disease: Status post femoral-tibial bypass graft in February 2015, femoral-popliteal bypass graft in August  2015, amputation of third toe in August 2015. No acute issues.  Hypertension: Mildly hypertensive. At home, patient is on hydralazine 10 mg twice a day, Coreg 12.5 mg twice a day, Imdur 60 mg daily. -Continue home regimen  Coronary artery disease: Status post prior angioplasty in 1995. After catheterization in 2005, medical therapy recommended for multivessel disease. -Continue Coreg 12.5 mg twice a day, Imdur 60 mg daily, aspirin 81 mg daily  Normocytic anemia: Hemoglobin of 8.9 near baseline 9-10 with no active bleeding.  Last anemia panel on 05/12/15 with ACD. Pt receives Aranesp injections as an outpatient. -Continue to monitor   Type 2 diabetes: Well controlled.  Last A1c 5.8 on 05/08/15. At home, patient is on Januvia 25 mg daily and Humalog 5 units twice a day for blood sugars greater than 250. -Continue home Januvia (linagliptin as inpatient).   Insomnia: -Continue home melatonin  Diet: Heart healthy  Prophylaxis: Heparin 5000 u subq Code: Full  Dispo: Possibly today  The patient does have a current PCP Eliott Nine, MD) and does need an [facility] hospital follow-up appointment after discharge.  The patient does not have transportation limitations that hinder transportation to clinic appointments.   LOS: 4 days   Services Needed at time of discharge: Y = Yes, Blank = No PT:  pending evaluation   OT:   RN:  No  Equipment:  None  Other:  None   Harold Barban, MD 06/15/2015, 8:05 AM

## 2015-06-15 NOTE — Discharge Instructions (Signed)
Please follow-up with cardiology as directed for your pacemaker. We have also prescribed a new supplement called ergocalciferol which is a form of vitamin D for some low vitamin D levels that we found here in the hospital. Please follow this once a week and follow-up with your primary care provider regarding further continuation of this.       Supplemental Discharge Instructions for  Pacemaker/Defibrillator Patients  Activity No heavy lifting or vigorous activity with your left/right arm for 6 to 8 weeks.  Do not raise your left/right arm above your head for one week.  Gradually raise your affected arm as drawn below.           __      06/19/15                    06/20/15                  06/21/15                    06/22/15   WOUND CARE - Keep the wound area clean and dry.  Do not get this area wet for one week. No showers for one week; you may shower on  06/22/15   . - The tape/steri-strips on your wound will fall off; do not pull them off.  No bandage is needed on the site.  DO  NOT apply any creams, oils, or ointments to the wound area. - If you notice any drainage or discharge from the wound, any swelling or bruising at the site, or you develop a fever > 101? F after you are discharged home, call the office at once.  Special Instructions - You are still able to use cellular telephones; use the ear opposite the side where you have your pacemaker/defibrillator.  Avoid carrying your cellular phone near your device. - When traveling through airports, show security personnel your identification card to avoid being screened in the metal detectors.  Ask the security personnel to use the hand wand. - Avoid arc welding equipment, MRI testing (magnetic resonance imaging), TENS units (transcutaneous nerve stimulators).  Call the office for questions about other devices. - Avoid electrical appliances that are in poor condition or are not properly grounded. - Microwave ovens are safe to be near or to  operate.

## 2015-06-15 NOTE — Progress Notes (Signed)
Daily Progress Note   Patient Name: Dorothy Bartlett       Date: 06/15/2015 DOB: [birth year]  Age: 81 y.o. MRN#: [id number] Attending Physician: Earl Lagos, MD Primary Care Physician: Eliott Nine, MD Admit Date: 06/11/2015  Subjective:     [name] sitting up in chair today and feeling well. She is happy to be going home today and says her children are always there to help her. She repeated many of the same stories she told me yesterday but is very pleasant and happy woman. I encouraged her to continue thinking about her healthcare and she was able to reflect on her daughter's death and how peaceful it was to sit with her and hold her hand when she passed. No decisions made today except she does not want dialysis at this time. I did speak with daughter, Dorothy Bartlett, over the phone and will send her home with MOST form for them to discuss as a family - encouraged them that any of her providers can help them complete/sign.    Length of Stay: 4 days  Current Medications: Scheduled Meds:  . aspirin EC  81 mg Oral q morning - 10a  . atorvastatin  40 mg Oral q morning - 10a  . carvedilol  12.5 mg Oral BID  . docusate sodium  100 mg Oral BID  . furosemide  40 mg Oral BID  . heparin  5,000 Units Subcutaneous 3 times per day  . hydrALAZINE  10 mg Oral BID  . isosorbide mononitrate  60 mg Oral Daily  . linagliptin  5 mg Oral Daily  . paricalcitol  1 mcg Oral Daily  . predniSONE  40 mg Oral Q breakfast  . RENA-VITE RX  1 tablet Oral q morning - 10a  . sodium chloride  3 mL Intravenous Q12H  . Vitamin D (Ergocalciferol)  50,000 Units Oral Q7 days    Continuous Infusions:    PRN Meds: acetaminophen **OR** acetaminophen, albuterol, loratadine, ondansetron (ZOFRAN) IV, ondansetron **OR** [DISCONTINUED] ondansetron (ZOFRAN) IV, ramelteon  Palliative Performance Scale: 50%     Vital Signs: BP 123/63 mmHg  Pulse 61  Temp(Src) 97.7 F (36.5 C) (Oral)  Resp 18  Ht 4\' 11"  (1.499  m)  Wt 78.926 kg (174 lb)  BMI 35.13 kg/m2  SpO2 97% SpO2: SpO2: 97 % O2 Device: O2 Device: Not Delivered O2 Flow Rate: O2 Flow Rate (L/min): 2 L/min  Intake/output summary:  Intake/Output Summary (Last 24 hours) at 06/15/15 1215 Last data filed at 06/15/15 0954  Gross per 24 hour  Intake    480 ml  Output    200 ml  Net    280 ml   LBM: Last BM Date: 06/13/15 Baseline Weight: Weight: 79.697 kg (175 lb 11.2 oz) Most recent weight: Weight: 78.926 kg (174 lb)  Physical Exam: General: NAD, pleasant HEENT: [place]/AT, moist mucous membranes CVS: RRR Resp: No labored breathing Abd: Soft, NT, ND Neuro: Awake, alert, oriented x 3   Additional Data Reviewed: Recent Labs     06/13/15  0422  06/14/15  0244  WBC  7.1  6.9  HGB  8.9*  9.0*  PLT  194  179  NA  140  140  BUN  68*  69*  CREATININE  3.82*  3.82*     Problem List:  Patient Active Problem List   Diagnosis Date Noted  . Vitamin D deficiency 06/14/2015  . Secondary hyperparathyroidism 06/14/2015  . Dyspnea   . Pulmonary hypertension   .  Acute on chronic combined systolic and diastolic congestive heart failure 06/11/2015  . Pacemaker lead malfunction 06/11/2015  . Cardiomyopathy-EF 40-45% by echo May 2016 06/11/2015  . Palliative care encounter   . Onychomycosis 06/03/2015  . Pain in lower limb 06/03/2015  . Pulmonary HTN 05/15/2015  . Acute on chronic renal failure   . Pacemaker-St Jude May 2016   . Acute on chronic combined systolic and diastolic ACC/AHA stage C congestive heart failure   . Elevated troponin 05/09/2015  . Pre-syncope 05/09/2015  . Acute renal failure superimposed on stage 4 chronic kidney disease 05/08/2015  . 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 Complete heart block 05/08/2015  . Obesity (BMI 30-39.9)   . Old anterolateral myocardial infarction   . Type 2 diabetes mellitus with renal manifestations   . Chronic combined systolic and diastolic  heart failure 04/13/2014  . PVD (peripheral vascular disease) 02/16/2014  . CKD stage 5, GFR less than 15 ml/min 01/15/2014  . Intrinsic asthma 01/28/2008  . Hypertensive heart disease   . Hyperlipidemia   . CAD S/P PTCA 1995      Palliative Care Assessment & Plan    Code Status:  FULL - she is undecided on her wishes  Goals of Care:  Treat the treatable. She does not wish for dialysis but does not give a firm no for the future.  3. Symptom Management:  Dyspnea: Improved. Continue medical mgt per medical team.   4. Prognosis: Difficult to say but poor  5. Discharge Planning: She is planning to return home. Children are very supportive.     Thank you for allowing the Palliative Medicine Team to assist in the care of this patient.   Time In: 1110 Time Out: 1130 Total Time Prolonged Time Billed  no     Greater than 50%  of this time was spent counseling and coordinating care related to the above assessment and plan.     Yong Channel, NP Palliative Medicine Team Pager # [phone] (M-F 8a-5p) Team Phone # [phone] (Nights/Weekends)  06/15/2015, 12:15 PM

## 2015-06-15 NOTE — Progress Notes (Signed)
SUBJECTIVE: The patient is doing well today.  At this time, she denies chest pain, shortness of breath, or any new concerns.  CURRENT MEDICATIONS: . aspirin EC  81 mg Oral q morning - 10a  . atorvastatin  40 mg Oral q morning - 10a  . carvedilol  12.5 mg Oral BID  . docusate sodium  100 mg Oral BID  . furosemide  40 mg Oral BID  . heparin  5,000 Units Subcutaneous 3 times per day  . hydrALAZINE  10 mg Oral BID  . isosorbide mononitrate  60 mg Oral Daily  . linagliptin  5 mg Oral Daily  . paricalcitol  1 mcg Oral Daily  . predniSONE  40 mg Oral Q breakfast  . RENA-VITE RX  1 tablet Oral q morning - 10a  . sodium chloride  3 mL Intravenous Q12H  . Vitamin D (Ergocalciferol)  50,000 Units Oral Q7 days      OBJECTIVE: Physical Exam: Filed Vitals:   06/15/15 0007 06/15/15 0300 06/15/15 0731 06/15/15 0759  BP: 147/48  160/56   Pulse: 66  65 65  Temp: 98.3 F (36.8 C) 97.5 F (36.4 C) 98.1 F (36.7 C)   TempSrc: Oral Oral Oral   Resp: 25  28 21   Height:      Weight:  174 lb (78.926 kg)    SpO2: 96%  97% 99%    Intake/Output Summary (Last 24 hours) at 06/15/15 0809 Last data filed at 06/14/15 1300  Gross per 24 hour  Intake    360 ml  Output    400 ml  Net    -40 ml    Telemetry reveals sinus rhythm with ventricular pacing  GEN- The patient is well appearing, alert and oriented x 3 today.   Head- normocephalic, atraumatic Eyes-  Sclera clear, conjunctiva pink Ears- hearing intact Oropharynx- clear Neck- supple, no JVP Lymph- no cervical lymphadenopathy Lungs- Clear to ausculation bilaterally, normal work of breathing Heart- Regular rate and rhythm (paced) GI- soft, NT, ND, + BS Extremities- no clubbing, cyanosis, or edema Skin- no rash or lesion Psych- euthymic mood, full affect Neuro- strength and sensation are intact  LABS: Basic Metabolic Panel:  Recent Labs  14/43/15 0422 06/14/15 0244  NA 140 140  K 4.0 3.9  CL 102 101  CO2 27 28  GLUCOSE  91 125*  BUN 68* 69*  CREATININE 3.82* 3.82*  CALCIUM 9.1 9.2   CBC:  Recent Labs  06/13/15 0422 06/14/15 0244  WBC 7.1 6.9  HGB 8.9* 9.0*  HCT 28.7* 28.4*  MCV 94.4 94.4  PLT 194 179    RADIOLOGY: Dg Chest 2 View 06/14/2015   CLINICAL DATA:  Post pacemaker placement yesterday, dyspnea  EXAM: CHEST  2 VIEW  COMPARISON:  06/12/2015  FINDINGS: Cardiomegaly is noted. Central mild vascular congestion without convincing pulmonary edema. Small bilateral pleural effusion with basilar atelectasis. No segmental infiltrate. There is 3 leads cardiac pacemaker with left subclavian approach. There is 1 lead in right atrium and 2 ventricular leads. No pneumothorax.  IMPRESSION: Central mild vascular congestion without convincing pulmonary edema. Small bilateral pleural effusion with basilar atelectasis. No segmental infiltrate. There is 3 leads cardiac pacemaker with left subclavian approach. There is 1 lead in right atrium and 2 ventricular leads. No pneumothorax.   Electronically Signed   By: Natasha Mead M.D.   On: 06/14/2015 08:59    ASSESSMENT AND PLAN:  Principal Problem:   Acute on chronic combined systolic and  diastolic congestive heart failure Active Problems:   Hyperlipidemia   Hypertensive heart disease   CAD S/P PTCA 1995   Intrinsic asthma   CKD stage 5, GFR less than 15 ml/min   PVD (peripheral vascular disease)   Chronic combined systolic and diastolic heart failure   Type 2 diabetes mellitus with renal manifestations   Obesity (BMI 30-39.9)   Anemia due to pre-end-stage renal disease treated with erythropoietin   Complete heart block   Pacemaker-St Jude May 2016   Pacemaker lead malfunction   Cardiomyopathy-EF 40-45% by echo May 2016   Palliative care encounter   Dyspnea   Pulmonary hypertension   Vitamin D deficiency   Secondary hyperparathyroidism  1.  Complete heart block with RV lead failure to capture S/p RV lead revision 06/13/15 CXR with stable lead position, no  ptx Device interrogated and functioning normally Routine wound care and follow up  2.  Combined systolic and diastolic heart failure Diuresis limited by renal function She appears close to euvolemic today  Ok to discharge home today from EP standpoint.  Wound care, appts entered into AVS.   Gypsy Balsam, NP 06/15/2015 8:09 AM

## 2015-06-17 ENCOUNTER — Ambulatory Visit (INDEPENDENT_AMBULATORY_CARE_PROVIDER_SITE_OTHER): Payer: Medicare Other | Admitting: Interventional Cardiology

## 2015-06-17 ENCOUNTER — Encounter: Payer: Self-pay | Admitting: Interventional Cardiology

## 2015-06-17 ENCOUNTER — Telehealth: Payer: Self-pay

## 2015-06-17 VITALS — BP 168/78 | HR 64 | Ht 59.0 in | Wt 176.8 lb

## 2015-06-17 DIAGNOSIS — Z950 Presence of cardiac pacemaker: Secondary | ICD-10-CM

## 2015-06-17 DIAGNOSIS — I110 Hypertensive heart disease with heart failure: Secondary | ICD-10-CM | POA: Diagnosis not present

## 2015-06-17 DIAGNOSIS — N185 Chronic kidney disease, stage 5: Secondary | ICD-10-CM | POA: Diagnosis not present

## 2015-06-17 DIAGNOSIS — I442 Atrioventricular block, complete: Secondary | ICD-10-CM

## 2015-06-17 DIAGNOSIS — I5042 Chronic combined systolic (congestive) and diastolic (congestive) heart failure: Secondary | ICD-10-CM

## 2015-06-17 DIAGNOSIS — I739 Peripheral vascular disease, unspecified: Secondary | ICD-10-CM

## 2015-06-17 DIAGNOSIS — Z515 Encounter for palliative care: Secondary | ICD-10-CM

## 2015-06-17 DIAGNOSIS — I509 Heart failure, unspecified: Secondary | ICD-10-CM

## 2015-06-17 MED ORDER — NITROGLYCERIN 0.4 MG SL SUBL: 0.4000 mg | SUBLINGUAL_TABLET | SUBLINGUAL | Status: AC | PRN

## 2015-06-17 NOTE — Telephone Encounter (Signed)
Pt daughter aware and agreeable to move appt time from 4pm to 2:30pm today with Dr.Smith

## 2015-06-17 NOTE — Progress Notes (Signed)
Cardiology Office Note   Date:  06/17/2015   ID:  Dorothy Bartlett, DOB 1926-09-02, MRN [id number]  PCP:  Eliott Nine, MD  Cardiologist:  Lesleigh Noe, MD   Chief Complaint  Patient presents with  . Congestive Heart Failure      History of Present Illness: Dorothy Bartlett is a 81 y.o. female who presents for coronary artery disease, combined systolic and diastolic heart failure, stage IV/V chronic kidney disease, hypertension, diabetes mellitus, and peripheral vascular disease. Recent high-grade AV block requiring DDD pacemaker therapy.  We spent considerable time discussing the overall management of her multiple complex medical problems. She has been told that she needs dialysis but she is unwilling to commit to that.she denies dyspnea currently. There is no chest pain. No palpitations and syncope. No significant lower extremity swelling.  With her daughter present we discussed long-term management. We discussed 2 options: 1.) Aggressive therapy including dialysis versus 2.) Medical therapy with hospice and palliative care. We discussed each option. We discussed CODE STATUS.  Past Medical History  Diagnosis Date  . Hyperlipidemia   . Carotid stenosis     Carotid US (12/2013): < 60% RCA, < 40% LICA.  [name] CKD (chronic kidney disease), stage IV     notes 02/22/2014; Dr. Hyman Hopes  . Hypertension   . Coronary artery disease     a. s/p MI 1995 tx w/ PTCA to Dx, Ramus, ap LAD;  b. LHC (11/2004):  Inferior HK, EF 60%, proximal LAD 50%, ostial D1 80%, mid D1 95%, mid LAD 50-60%, distal LAD at the apex 90%, OM1 occluded (fills late by left to left collaterals), RCA 75%. Medical therapy recommended.;  c. Lexiscan Myoview (12/2013):  Prior ant-lat scar with very small area of peri-infarct ischemia in ant wall, EF 40 (high risk)   . Obesity (BMI 30-39.9)   . 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 Type 2 diabetes mellitus with renal  manifestations   . Intrinsic asthma 01/28/2008     Mild intermittant asthma, PFT's 01/28/08     . Old anterolateral myocardial infarction   . PVD (peripheral vascular disease) 02/16/2014    2015, right femoral peroneal bypass grafting and left femoral-tibial bypass grafting by Dr. Arbie Cookey for rest pain as well as nonhealing ulcer on toe accompanied with toe amputation  Arteriogram October 2015 with patent graft on the right, 70% stenosis of graft on the left proximally.   . Chronic combined systolic and diastolic heart failure 04/13/2014  . Complete heart block     a. 05/2015 s/p [facility] Assurity DR model [id number] dual chamber PPM (serial number [id number]) (Dr. Johney Frame).  [name] Hx of echocardiogram     a. Echo 5/16: dist septal, dist ant, dist inf and apical AK, EF 40-45%, mod MR, mild LAE, PASP 72    Past Surgical History  Procedure Laterality Date  . Repair of nerve in left arm  Left 2000  . Cardiac catheterization  1995    w/ PTCA Diag 2nd MI  . Femoral-tibial bypass graft Left 02/24/2014    Procedure: BYPASS GRAFT FEMORAL-TIBIAL ARTERY;  Surgeon: Larina Earthly, MD;  Location: [facility] OR;  Service: Vascular;  Laterality: Left;  . Femoral-popliteal bypass graft Right 08/04/2014    Procedure: RIGHT FEMORAL-PERONEAL ARTERY BYPASS GRAFT;  Surgeon: Larina Earthly, MD;  Location: [facility] OR;  Service: Vascular;  Laterality: Right;  . Amputation Right 08/04/2014    Procedure: AMPUTATION DIGIT-RIGHT 3RD TOE;  Surgeon: Larina Earthly, MD;  Location: [facility] OR;  Service: Vascular;  Laterality: Right;  . Lower extremity angiogram Left 01/08/2014    Procedure: LOWER EXTREMITY ANGIOGRAM;  Surgeon: Sherren Kerns, MD;  Location: [facility] CATH LAB;  Service: Cardiovascular;  Laterality: Left;  . Abdominal angiogram  01/08/2014    Procedure: ABDOMINAL ANGIOGRAM;  Surgeon: Sherren Kerns, MD;  Location: [facility] CATH LAB;  Service: Cardiovascular;;  . Lower extremity angiogram Right 08/03/2014    Procedure: LOWER EXTREMITY ANGIOGRAM;  Surgeon:  Nada Libman, MD;  Location: [facility] CATH LAB;  Service: Cardiovascular;  Laterality: Right;  . Abdominal aortagram N/A 10/25/2014    Procedure: ABDOMINAL Ronny Flurry;  Surgeon: Chuck Hint, MD;  Location: [facility] CATH LAB;  Service: Cardiovascular;  Laterality: N/A;  . Ep implantable device N/A 05/10/2015    Procedure: Pacemaker Implant;  Surgeon: Hillis Range, MD;  Location: [facility] INVASIVE CV LAB;  Service: Cardiovascular;  Laterality: N/A;  . Ep implantable device N/A 06/13/2015    Procedure: Lead Revision/Repair;  Surgeon: Duke Salvia, MD;  Location: [facility] INVASIVE CV LAB;  Service: Cardiovascular;  Laterality: N/A;  . Pacemaker lead removal  06/13/2015    Procedure: Pacemaker Lead Removal;  Surgeon: Duke Salvia, MD;  Location: [facility] INVASIVE CV LAB;  Service: Cardiovascular;;     Current Outpatient Prescriptions  Medication Sig Dispense Refill  . albuterol (PROVENTIL HFA;VENTOLIN HFA) 108 (90 BASE) MCG/ACT inhaler Inhale 2 puffs into the lungs every 6 (six) hours as needed for wheezing or shortness of breath.    [name] aspirin 81 MG tablet Take 81 mg by mouth every morning.     [name] atorvastatin (LIPITOR) 40 MG tablet Take 40 mg by mouth every morning.     . B Complex-C-Folic Acid (RENA-VITE RX) 1 MG TABS Take 1 tablet by mouth every morning.     . carvedilol (COREG) 12.5 MG tablet Take 1 tablet (12.5 mg total) by mouth 2 (two) times daily. 180 tablet 1  . docusate sodium (COLACE) 100 MG capsule Take 100 mg by mouth 2 (two) times daily.     . furosemide (LASIX) 40 MG tablet Take 1 tablet (40 mg total) by mouth daily.    . hydrALAZINE (APRESOLINE) 10 MG tablet Take 1 tablet by mouth 2 (two) times daily.    . insulin lispro (HUMALOG) 100 UNIT/ML injection Inject 5 Units into the skin 2 (two) times daily as needed for high blood sugar. Only takes if CBG >250.    [name] isosorbide mononitrate (IMDUR) 30 MG 24 hr tablet Take 30 mg by mouth daily.    [name] loratadine (CLARITIN) 10 MG tablet Take 10 mg by mouth  daily as needed for allergies.    . Melatonin 3 MG TABS Take 3 mg by mouth at bedtime as needed (for sleep).     . nitroGLYCERIN (NITROSTAT) 0.4 MG SL tablet Place 1 tablet (0.4 mg total) under the tongue every 5 (five) minutes x 3 doses as needed for chest pain. 25 tablet 3  . paricalcitol (ZEMPLAR) 1 MCG capsule Take 1 mcg by mouth daily.     . sitaGLIPtin (JANUVIA) 25 MG tablet Take 25 mg by mouth daily.    . Vitamin D, Ergocalciferol, (DRISDOL) 50000 UNITS CAPS capsule Take 1 capsule (50,000 Units total) by mouth every 7 (seven) days. 10 capsule 0   No current facility-administered medications for this visit.    Allergies:   Review of patient's allergies indicates no known allergies.  Social History:  The patient  reports that she has never smoked. She has never used smokeless tobacco. She reports that she does not drink alcohol or use illicit drugs.   Family History:  The patient's family history includes Diabetes in her father; Hyperlipidemia in her daughter and son; Hypertension in her daughter, father, and sister.    ROS:  Please see the history of present illness.   Otherwise, review of systems are positive for confused and feels pressure to make a decision. No chest pain. Feels her quality of life currently is good..   All other systems are reviewed and negative.    PHYSICAL EXAM: VS:  BP 168/78 mmHg  Pulse 64  Ht  (1.499 m)  Wt 80.196 kg (176 lb 12.8 oz)  BMI 35.69 kg/m2  SpO2 98% , BMI Body mass index is 35.69 kg/(m^2). GEN: Well nourished, well developed, in no acute distress HEENT: normal Neck: no JVD, carotid bruits, or masses Cardiac: RRR; no murmurs, rubs, or gallops,no edema  Respiratory:  clear to auscultation bilaterally, normal work of breathing GI: soft, nontender, nondistended, + BS MS: no deformity or atrophy Skin: warm and dry, no rash Neuro:  Strength and sensation are intact Psych: euthymic mood, full affect   EKG:  EKG is not ordered  today.   Recent Labs: 05/08/2015: TSH 2.772 06/11/2015: ALT 47; B Natriuretic Peptide 2283.5* 06/12/2015: Magnesium 2.2 06/14/2015: BUN 69*; Creatinine, Ser 3.82*; Hemoglobin 9.0*; Platelets 179; Potassium 3.9; Sodium 140    Lipid Panel No results found for: CHOL, TRIG, HDL, CHOLHDL, VLDL, LDLCALC, LDLDIRECT    Wt Readings from Last 3 Encounters:  06/17/15 80.196 kg (176 lb 12.8 oz)  06/15/15 78.926 kg (174 lb)  06/01/15 78.926 kg (174 lb)      Other studies Reviewed: Additional studies/ records that were reviewed today include: reviewed recent hospital stay and discharge summary.. Review of the above records demonstrates:    ASSESSMENT AND PLAN:  Hypertensive heart disease with heart failure -excellent blood pressure control  CKD stage 5, GFR less than 15 ml/min  Chronic combined systolic and diastolic heart failure - appears euvolemic currently  Complete heart block With DDD pacemaker  PVD (peripheral vascular disease)  Palliative care - needed but not completely agreed-upon      Current medicines are reviewed at length with the patient today.  The patient does not have concerns regarding medicines.  The following changes have been made:  The patient would not consent to dialysis. She wants conservative medical management but is not yet able to commit to hospice and palliative care. She does not currently have an urgent requirement for hospice and palliative care. I did explain to her that this would be necessary if she chose conservative medical management to ensure that she would have adequate support at home and comfort.  She will follow-up in pacemaker clinic in 2 weeks.  Greater than half the time spent on this 40 minute office visit was with face-to-face discussion concerning prognosis, hospice and palliative care, and reviewing records related to recent hospital stay.  Labs/ tests ordered today include:  No orders of the defined types were placed in this  encounter.     Disposition:   FU with HS in 2 months  Signed, Lesleigh Noe, MD  06/17/2015 3:42 PM    [facility] [address] [place], [place], [place]  [address] Phone: [phone]; Fax: [phone]

## 2015-06-17 NOTE — Patient Instructions (Addendum)
Medication Instructions:  Your physician recommends that you continue on your current medications as directed. Please refer to the Current Medication list given to you today.   Labwork: None   Testing/Procedures: None   Follow-Up: You have a follow up appointment scheduled on 08/02/15 @ 2:30pm  Any Other Special Instructions Will Be Listed Below (If Applicable).

## 2015-06-18 ENCOUNTER — Encounter: Payer: Self-pay | Admitting: Interventional Cardiology

## 2015-06-18 NOTE — Progress Notes (Signed)
Not a note.

## 2015-06-23 ENCOUNTER — Encounter: Payer: Self-pay | Admitting: Internal Medicine

## 2015-06-23 ENCOUNTER — Ambulatory Visit (INDEPENDENT_AMBULATORY_CARE_PROVIDER_SITE_OTHER): Payer: Medicare Other | Admitting: *Deleted

## 2015-06-23 DIAGNOSIS — Z950 Presence of cardiac pacemaker: Secondary | ICD-10-CM | POA: Diagnosis not present

## 2015-06-23 DIAGNOSIS — I442 Atrioventricular block, complete: Secondary | ICD-10-CM | POA: Diagnosis not present

## 2015-06-23 LAB — CUP PACEART INCLINIC DEVICE CHECK
Battery Remaining Longevity: 117.6 mo
Battery Voltage: 2.95 V
Brady Statistic RA Percent Paced: 17 %
Brady Statistic RV Percent Paced: 99.77 %
Date Time Interrogation Session: 20160623152924
Lead Channel Impedance Value: 437.5 Ohm
Lead Channel Impedance Value: 475 Ohm
Lead Channel Pacing Threshold Amplitude: 0.5 V
Lead Channel Pacing Threshold Amplitude: 0.75 V
Lead Channel Pacing Threshold Amplitude: 0.75 V
Lead Channel Pacing Threshold Pulse Width: 0.4 ms
Lead Channel Pacing Threshold Pulse Width: 0.5 ms
Lead Channel Pacing Threshold Pulse Width: 0.5 ms
Lead Channel Sensing Intrinsic Amplitude: 1.5 mV
Lead Channel Sensing Intrinsic Amplitude: 12 mV
Lead Channel Setting Pacing Amplitude: 1 V
Lead Channel Setting Pacing Amplitude: 1.5 V
Lead Channel Setting Pacing Pulse Width: 0.5 ms
Lead Channel Setting Sensing Sensitivity: 5 mV
Pulse Gen Model: 2240
Pulse Gen Serial Number: 7759868

## 2015-06-23 NOTE — Progress Notes (Signed)
RV lead revision wound check appointment. No steri-strips, dermabond used. Wound without redness or edema. Incision edges approximated, wound well healed. Normal device function. Thresholds, sensing, and impedances consistent with implant measurements. Device programmed at 3.5V/auto capture programmed on for extra safety margin until 3 month visit. Histogram distribution appropriate for patient and level of activity. No mode switches or high ventricular rates noted. Patient educated about wound care, arm mobility, lifting restrictions. ROV w/ SK 09/19/15 (pt requested SK).

## 2015-07-01 ENCOUNTER — Telehealth: Payer: Self-pay | Admitting: Interventional Cardiology

## 2015-07-01 NOTE — Telephone Encounter (Signed)
Talked with daughter Harriett Sineancy.  Her home health nurse had mentioned 9lb weight gain this week but daughter feels it is a comparison of her current weight with the recorded weight before hospitalization.   Harriett Sineancy, her daughter said her weights were as follows this week: (she was with mother at both provider appointments)  [facility]  [facility] 181lbs  Friday, Today at home: 179.6 lbs  Swelling in ankles this week: Occupational Therapist reports to daughter that the patient has  more shortness of breath with activity this week.      Has been more active this week, swelling decreased with leg elevation  No increase in salt intake.  Daughter not aware of HR or B/P readings this week.  Is taking lasix 40 mg   Daughters concern is swelling in ankles this week.  Instructed daughter to keep mothers legs elevated as much as possible.  If she starts having shortness of breath with worsening swelling or chest pain needs to take her to the ED

## 2015-07-01 NOTE — Telephone Encounter (Signed)
New Message  Pt daughter calling to speak w/ RN- Pt c/o of weight gain/retaining fluid. Per pt daughter a home/health RN had reported a 9 lbs weight gain this week, but the daughter personally weighed her @  Monday (6/27) 181 lbs and  Today 179.6 lbs  Pt c/o swelling: STAT is pt has developed SOB within 24 hours  1. How long have you been experiencing swelling? 1 week, swelling dec after elevation   2. Where is the swelling located? Both. More so right- ankle  3.  Are you currently taking a "fluid pill"? Lasix 40 mg   4.  Are you currently SOB? Gets a little labored after exertion, sitting- breathes fine  5.  Have you traveled recently? no

## 2015-07-03 ENCOUNTER — Emergency Department (HOSPITAL_COMMUNITY): Payer: Medicare Other

## 2015-07-03 ENCOUNTER — Inpatient Hospital Stay (HOSPITAL_COMMUNITY)
Admission: EM | Admit: 2015-07-03 | Discharge: 2015-07-05 | DRG: 291 | Disposition: A | Discharge status: Hospice | Payer: Medicare Other | Attending: Internal Medicine | Admitting: Internal Medicine

## 2015-07-03 DIAGNOSIS — Z9111 Patient's noncompliance with dietary regimen: Secondary | ICD-10-CM | POA: Diagnosis present

## 2015-07-03 DIAGNOSIS — R0602 Shortness of breath: Secondary | ICD-10-CM

## 2015-07-03 DIAGNOSIS — E1121 Type 2 diabetes mellitus with diabetic nephropathy: Secondary | ICD-10-CM | POA: Diagnosis not present

## 2015-07-03 DIAGNOSIS — E669 Obesity, unspecified: Secondary | ICD-10-CM | POA: Diagnosis present

## 2015-07-03 DIAGNOSIS — I429 Cardiomyopathy, unspecified: Secondary | ICD-10-CM | POA: Diagnosis present

## 2015-07-03 DIAGNOSIS — I739 Peripheral vascular disease, unspecified: Secondary | ICD-10-CM | POA: Diagnosis present

## 2015-07-03 DIAGNOSIS — I6529 Occlusion and stenosis of unspecified carotid artery: Secondary | ICD-10-CM | POA: Diagnosis present

## 2015-07-03 DIAGNOSIS — I252 Old myocardial infarction: Secondary | ICD-10-CM | POA: Diagnosis not present

## 2015-07-03 DIAGNOSIS — I272 Other secondary pulmonary hypertension: Secondary | ICD-10-CM | POA: Diagnosis present

## 2015-07-03 DIAGNOSIS — Z9114 Patient's other noncompliance with medication regimen: Secondary | ICD-10-CM | POA: Diagnosis present

## 2015-07-03 DIAGNOSIS — Z950 Presence of cardiac pacemaker: Secondary | ICD-10-CM

## 2015-07-03 DIAGNOSIS — J45909 Unspecified asthma, uncomplicated: Secondary | ICD-10-CM

## 2015-07-03 DIAGNOSIS — D631 Anemia in chronic kidney disease: Secondary | ICD-10-CM | POA: Diagnosis present

## 2015-07-03 DIAGNOSIS — N185 Chronic kidney disease, stage 5: Secondary | ICD-10-CM | POA: Diagnosis present

## 2015-07-03 DIAGNOSIS — Z6835 Body mass index (BMI) 35.0-35.9, adult: Secondary | ICD-10-CM | POA: Diagnosis not present

## 2015-07-03 DIAGNOSIS — I132 Hypertensive heart and chronic kidney disease with heart failure and with stage 5 chronic kidney disease, or end stage renal disease: Secondary | ICD-10-CM | POA: Diagnosis present

## 2015-07-03 DIAGNOSIS — I442 Atrioventricular block, complete: Secondary | ICD-10-CM

## 2015-07-03 DIAGNOSIS — I119 Hypertensive heart disease without heart failure: Secondary | ICD-10-CM | POA: Diagnosis present

## 2015-07-03 DIAGNOSIS — Z91128 Patient's intentional underdosing of medication regimen for other reason: Secondary | ICD-10-CM | POA: Diagnosis present

## 2015-07-03 DIAGNOSIS — Z87891 Personal history of nicotine dependence: Secondary | ICD-10-CM | POA: Diagnosis not present

## 2015-07-03 DIAGNOSIS — I10 Essential (primary) hypertension: Secondary | ICD-10-CM

## 2015-07-03 DIAGNOSIS — Z9861 Coronary angioplasty status: Secondary | ICD-10-CM | POA: Diagnosis not present

## 2015-07-03 DIAGNOSIS — E785 Hyperlipidemia, unspecified: Secondary | ICD-10-CM | POA: Diagnosis present

## 2015-07-03 DIAGNOSIS — I509 Heart failure, unspecified: Secondary | ICD-10-CM | POA: Insufficient documentation

## 2015-07-03 DIAGNOSIS — E559 Vitamin D deficiency, unspecified: Secondary | ICD-10-CM | POA: Diagnosis present

## 2015-07-03 DIAGNOSIS — I6789 Other cerebrovascular disease: Secondary | ICD-10-CM | POA: Diagnosis not present

## 2015-07-03 DIAGNOSIS — Z794 Long term (current) use of insulin: Secondary | ICD-10-CM

## 2015-07-03 DIAGNOSIS — J4520 Mild intermittent asthma, uncomplicated: Secondary | ICD-10-CM | POA: Diagnosis present

## 2015-07-03 DIAGNOSIS — Z515 Encounter for palliative care: Secondary | ICD-10-CM

## 2015-07-03 DIAGNOSIS — E1122 Type 2 diabetes mellitus with diabetic chronic kidney disease: Secondary | ICD-10-CM | POA: Diagnosis present

## 2015-07-03 DIAGNOSIS — M7989 Other specified soft tissue disorders: Secondary | ICD-10-CM

## 2015-07-03 DIAGNOSIS — Z7951 Long term (current) use of inhaled steroids: Secondary | ICD-10-CM

## 2015-07-03 DIAGNOSIS — Z7982 Long term (current) use of aspirin: Secondary | ICD-10-CM | POA: Diagnosis not present

## 2015-07-03 DIAGNOSIS — Z89421 Acquired absence of other right toe(s): Secondary | ICD-10-CM | POA: Diagnosis not present

## 2015-07-03 DIAGNOSIS — E1129 Type 2 diabetes mellitus with other diabetic kidney complication: Secondary | ICD-10-CM | POA: Diagnosis present

## 2015-07-03 DIAGNOSIS — I5043 Acute on chronic combined systolic (congestive) and diastolic (congestive) heart failure: Secondary | ICD-10-CM

## 2015-07-03 DIAGNOSIS — Z79899 Other long term (current) drug therapy: Secondary | ICD-10-CM | POA: Diagnosis not present

## 2015-07-03 DIAGNOSIS — I2510 Atherosclerotic heart disease of native coronary artery without angina pectoris: Secondary | ICD-10-CM

## 2015-07-03 DIAGNOSIS — I5032 Chronic diastolic (congestive) heart failure: Secondary | ICD-10-CM

## 2015-07-03 HISTORY — DX: Chronic kidney disease, stage 5: N18.5

## 2015-07-03 HISTORY — DX: Presence of cardiac pacemaker: Z95.0

## 2015-07-03 LAB — CBC
HCT: 31.4 % — ABNORMAL LOW (ref 36.0–46.0)
Hemoglobin: 9.9 g/dL — ABNORMAL LOW (ref 12.0–15.0)
MCH: 29.6 pg (ref 26.0–34.0)
MCHC: 31.5 g/dL (ref 30.0–36.0)
MCV: 94 fL (ref 78.0–100.0)
PLATELETS: 184 10*3/uL (ref 150–400)
RBC: 3.34 MIL/uL — ABNORMAL LOW (ref 3.87–5.11)
RDW: 14.6 % (ref 11.5–15.5)
WBC: 6.3 10*3/uL (ref 4.0–10.5)

## 2015-07-03 LAB — BASIC METABOLIC PANEL
Anion gap: 13 (ref 5–15)
BUN: 57 mg/dL — AB (ref 6–20)
CO2: 23 mmol/L (ref 22–32)
CREATININE: 3.61 mg/dL — AB (ref 0.44–1.00)
Calcium: 9.3 mg/dL (ref 8.9–10.3)
Chloride: 101 mmol/L (ref 101–111)
GFR calc Af Amer: 12 mL/min — ABNORMAL LOW (ref >60)
GFR calc non Af Amer: 10 mL/min — ABNORMAL LOW (ref >60)
Glucose, Bld: 205 mg/dL — ABNORMAL HIGH (ref 65–99)
POTASSIUM: 4.4 mmol/L (ref 3.5–5.1)
Sodium: 137 mmol/L (ref 135–145)

## 2015-07-03 LAB — I-STAT TROPONIN, ED: TROPONIN I, POC: 0 ng/mL (ref 0.00–0.08)

## 2015-07-03 LAB — BRAIN NATRIURETIC PEPTIDE: B Natriuretic Peptide: 2462.5 pg/mL — ABNORMAL HIGH (ref 0.0–100.0)

## 2015-07-03 MED ORDER — HEPARIN SODIUM (PORCINE) 5000 UNIT/ML IJ SOLN
5000.0000 [IU] | Freq: Three times a day (TID) | INTRAMUSCULAR | Status: DC
  Administered 2015-07-04 – 2015-07-05 (×6): 5000 [IU] via SUBCUTANEOUS
  Filled 2015-07-03 (×7): qty 1

## 2015-07-03 MED ORDER — FUROSEMIDE 10 MG/ML IJ SOLN
40.0000 mg | Freq: Once | INTRAMUSCULAR | Status: AC
  Administered 2015-07-04: 40 mg via INTRAVENOUS
  Filled 2015-07-03 (×2): qty 4

## 2015-07-03 NOTE — ED Notes (Signed)
Dr. Radford PaxBeaton states patient may have ice chips/water - as long as it does not contain sodium.

## 2015-07-03 NOTE — ED Notes (Signed)
IV team at bedside at this time. 

## 2015-07-03 NOTE — ED Notes (Addendum)
Presents with bilateral leg edema, SOB and orthopnea for one wekk-progressively worsening-p[t reports she can't lie down to not being able to breathe. Sats 97% RA-denies CP hx of CHF-rhales-any exertion causes SOB and fatigue

## 2015-07-03 NOTE — ED Notes (Signed)
Unable to give report at this time.

## 2015-07-03 NOTE — ED Notes (Signed)
Patient returned to room from X-ray 

## 2015-07-03 NOTE — ED Notes (Signed)
Phlebotomy in room to obtain labs

## 2015-07-03 NOTE — H&P (Signed)
Date: 07/04/2015               Patient Name:  Dorothy Bartlett MRN: [id number]  DOB: Jul 26, 1926 Age / Sex: 81 y.o., female   PCP: Eliott Nine, MD         Medical Service: Internal Medicine Teaching Service         Attending Physician: Dr. Doneen Poisson, MD    First Contact: Dr. Isabella Bowens Pager: [phone]  Second Contact: Dr. Yetta Barre Pager: [phone]       After Hours (After 5p/  First Contact Pager: [phone]  weekends / holidays): Second Contact Pager: [phone]   Chief Complaint: SOB, increased swelling of legs, increased weight.  History of Present Illness: Dorothy Bartlett is a 81 yo female with HFpEF (EF 40-45% in May 2016), pulmonary hypertension, complete heart block with dual chamber pacemaker s/p lead revision, peripheral vascular disease s/p b/l bypass grafts (Left: Feb 2015; Right: Aug 2015), CKD stage V, HTN, CAD, DMII, and normocytic anemia.  She presented to the ED complaining of worsening SOB, leg swelling (R>L), and weight gain that began yesterday.  She was recently discharged from [facility] on 6/15 for heart failure exacerbation 2/2 loss of capture of RV pacemaker lead.  She was discharged home with instructions to increase Lasix dose from 40 mg PO daily to 40 mg PO BID.  However, patient and her daughter report taking the Lasix only once daily.  She is unable to lie flat.  She endorses the possibility of diet noncompliance, as she has been eating food given by her neighbors that may have had too much salt.  She denies excessive volume intake.  She denies chest pain or palpitations.  She was satting 100% on RA.  She followed-up with Cardiology as an outpatient, and reports that the pacemaker is working properly.  Her last echo was 05/2015, showing EF 40-45%, moderate mitral regurgitation, and pulmonary hypertension.  Echo from 01/2014 shows grade 1 LV diastolic dysfunction.  She and her daughter are about to begin talks with Hospice regarding goals of care.  The daughter has said that  the patient would like CPR/intubation if for a reversible cause.  However, she does not want to go on dialysis.  Meds: Current Facility-Administered Medications  Medication Dose Route Frequency Provider Last Rate Last Dose  . albuterol (PROVENTIL) (2.5 MG/3ML) 0.083% nebulizer solution 2.5 mg  2.5 mg Inhalation Q6H PRN Tasrif Ahmed, MD      . aspirin chewable tablet 81 mg  81 mg Oral Daily Tasrif Ahmed, MD      . atorvastatin (LIPITOR) tablet 40 mg  40 mg Oral q1800 Tasrif Ahmed, MD      . carvedilol (COREG) tablet 12.5 mg  12.5 mg Oral BID WC Doneen Poisson, MD      . docusate sodium (COLACE) capsule 100 mg  100 mg Oral BID Tasrif Ahmed, MD      . heparin injection 5,000 Units  5,000 Units Subcutaneous 3 times per day Hyacinth Meeker, MD   5,000 Units at 07/04/15 0006  . insulin aspart (novoLOG) injection 0-5 Units  0-5 Units Subcutaneous QHS Jana Half, MD   0 Units at 07/04/15 0200  . insulin aspart (novoLOG) injection 0-9 Units  0-9 Units Subcutaneous TID WC Jana Half, MD      . loratadine (CLARITIN) tablet 10 mg  10 mg Oral Daily PRN Tasrif Ahmed, MD      . multivitamin (RENA-VIT) tablet 1 tablet  1 tablet Oral  QHS Tasrif Ahmed, MD      . nitroGLYCERIN (NITROSTAT) SL tablet 0.4 mg  0.4 mg Sublingual Q5 Min x 3 PRN Tasrif Ahmed, MD      . paricalcitol (ZEMPLAR) capsule 1 mcg  1 mcg Oral Daily Tasrif Ahmed, MD        Allergies: Allergies as of 07/03/2015  . (No Known Allergies)   Past Medical History  Diagnosis Date  . Hyperlipidemia   . Carotid stenosis     Carotid US (12/2013): < 60% RCA, < 40% LICA.  [name] CKD (chronic kidney disease), stage IV     notes 02/22/2014; Dr. Hyman Hopes  . Hypertension   . Coronary artery disease     a. s/p MI 1995 tx w/ PTCA to Dx, Ramus, ap LAD;  b. LHC (11/2004):  Inferior HK, EF 60%, proximal LAD 50%, ostial D1 80%, mid D1 95%, mid LAD 50-60%, distal LAD at the apex 90%, OM1 occluded (fills late by left to left collaterals), RCA 75%. Medical  therapy recommended.;  c. Lexiscan Myoview (12/2013):  Prior ant-lat scar with very small area of peri-infarct ischemia in ant wall, EF 40 (high risk)   . Obesity (BMI 30-39.9)   . 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 Type 2 diabetes mellitus with renal manifestations   . Intrinsic asthma 01/28/2008     Mild intermittant asthma, PFT's 01/28/08     . Old anterolateral myocardial infarction   . PVD (peripheral vascular disease) 02/16/2014    2015, right femoral peroneal bypass grafting and left femoral-tibial bypass grafting by Dr. Arbie Cookey for rest pain as well as nonhealing ulcer on toe accompanied with toe amputation  Arteriogram October 2015 with patent graft on the right, 70% stenosis of graft on the left proximally.   . Chronic combined systolic and diastolic heart failure 04/13/2014  . Complete heart block     a. 05/2015 s/p [facility] Assurity DR model [phone] dual chamber PPM (serial number [id number]) (Dr. Johney Frame).  [name] Hx of echocardiogram     a. Echo 5/16: dist septal, dist ant, dist inf and apical AK, EF 40-45%, mod MR, mild LAE, PASP 72   Past Surgical History  Procedure Laterality Date  . Repair of nerve in left arm  Left 2000  . Cardiac catheterization  1995    w/ PTCA Diag 2nd MI  . Femoral-tibial bypass graft Left 02/24/2014    Procedure: BYPASS GRAFT FEMORAL-TIBIAL ARTERY;  Surgeon: Larina Earthly, MD;  Location: [facility] OR;  Service: Vascular;  Laterality: Left;  . Femoral-popliteal bypass graft Right 08/04/2014    Procedure: RIGHT FEMORAL-PERONEAL ARTERY BYPASS GRAFT;  Surgeon: Larina Earthly, MD;  Location: [facility] OR;  Service: Vascular;  Laterality: Right;  . Amputation Right 08/04/2014    Procedure: AMPUTATION DIGIT-RIGHT 3RD TOE;  Surgeon: Larina Earthly, MD;  Location: [facility] OR;  Service: Vascular;  Laterality: Right;  . Lower extremity angiogram Left 01/08/2014    Procedure: LOWER EXTREMITY ANGIOGRAM;  Surgeon: Sherren Kerns, MD;  Location: [facility] CATH  LAB;  Service: Cardiovascular;  Laterality: Left;  . Abdominal angiogram  01/08/2014    Procedure: ABDOMINAL ANGIOGRAM;  Surgeon: Sherren Kerns, MD;  Location: [facility] CATH LAB;  Service: Cardiovascular;;  . Lower extremity angiogram Right 08/03/2014    Procedure: LOWER EXTREMITY ANGIOGRAM;  Surgeon: Nada Libman, MD;  Location: [facility] CATH LAB;  Service: Cardiovascular;  Laterality: Right;  . Abdominal aortagram N/A 10/25/2014    Procedure:  ABDOMINAL AORTAGRAM;  Surgeon: Chuck Hint, MD;  Location: [facility] CATH LAB;  Service: Cardiovascular;  Laterality: N/A;  . Ep implantable device N/A 05/10/2015    Procedure: Pacemaker Implant;  Surgeon: Hillis Range, MD;  Location: [facility] INVASIVE CV LAB;  Service: Cardiovascular;  Laterality: N/A;  . Ep implantable device N/A 06/13/2015    Procedure: Lead Revision/Repair;  Surgeon: Duke Salvia, MD;  Location: [facility] INVASIVE CV LAB;  Service: Cardiovascular;  Laterality: N/A;  . Pacemaker lead removal  06/13/2015    Procedure: Pacemaker Lead Removal;  Surgeon: Duke Salvia, MD;  Location: [facility] INVASIVE CV LAB;  Service: Cardiovascular;;   Family History  Problem Relation Age of Onset  . Diabetes Father   . Hypertension Father   . Hypertension Daughter   . Hyperlipidemia Daughter   . Hyperlipidemia Son   . Hypertension Sister    History   Social History  . Marital Status: Widowed    Spouse Name: N/A  . Number of Children: N/A  . Years of Education: N/A   Occupational History  . Retired    Social History Main Topics  . Smoking status: Never Smoker   . Smokeless tobacco: Never Used  . Alcohol Use: No  . Drug Use: No  . Sexual Activity: No   Other Topics Concern  . Not on file   Social History Narrative   Tobacco use cigarettes: Former Smoker   Occupation: Unemployed, retired   Marital Status: Single   Children: 2 daughters    Review of Systems: Pertinent items are noted in HPI.  Physical Exam: Blood pressure 137/66, pulse 61,  temperature 98.3 F (36.8 C), temperature source Oral, resp. rate 22, height 4\' 11"  (1.499 m), weight 184 lb 8 oz (83.689 kg), SpO2 100 %. Physical Exam  Constitutional: She is oriented to person, place, and time and well-developed, well-nourished, and in no distress.  HENT:  Head: Normocephalic and atraumatic.  Eyes: EOM are normal.  Neck: No tracheal deviation present.  JVD could not be assessed due to body habitus  Cardiovascular: Normal rate, regular rhythm and normal heart sounds.  Exam reveals no gallop and no friction rub.   No murmur heard. Posterior tibialis pulses 1+.  DP pulses not appreciated.  Pulmonary/Chest: Effort normal and breath sounds normal. No respiratory distress. She has no wheezes.  Mild crackles in bilateral bases  Abdominal: Soft. Bowel sounds are normal. There is no tenderness. There is no rebound and no guarding.  Neurological: She is alert and oriented to person, place, and time.  Skin: Skin is warm and dry.  2+ pitting edema to knees bilaterally.  Right leg more swollen than left.      Lab results: Basic Metabolic Panel:  Recent Labs  16/10/96 1950  NA 137  K 4.4  CL 101  CO2 23  GLUCOSE 205*  BUN 57*  CREATININE 3.61*  CALCIUM 9.3   Liver Function Tests: No results for input(s): AST, ALT, ALKPHOS, BILITOT, PROT, ALBUMIN in the last 72 hours. No results for input(s): LIPASE, AMYLASE in the last 72 hours. No results for input(s): AMMONIA in the last 72 hours. CBC:  Recent Labs  07/03/15 1950  WBC 6.3  HGB 9.9*  HCT 31.4*  MCV 94.0  PLT 184   Cardiac Enzymes: No results for input(s): CKTOTAL, CKMB, CKMBINDEX, TROPONINI in the last 72 hours. BNP: No results for input(s): PROBNP in the last 72 hours. D-Dimer: No results for input(s): DDIMER in the last 72 hours. CBG:  Recent Labs  07/04/15 0048  GLUCAP 132*   Hemoglobin A1C: No results for input(s): HGBA1C in the last 72 hours. Fasting Lipid Panel: No results for input(s):  CHOL, HDL, LDLCALC, TRIG, CHOLHDL, LDLDIRECT in the last 72 hours. Thyroid Function Tests: No results for input(s): TSH, T4TOTAL, FREET4, T3FREE, THYROIDAB in the last 72 hours. Anemia Panel: No results for input(s): VITAMINB12, FOLATE, FERRITIN, TIBC, IRON, RETICCTPCT in the last 72 hours. Coagulation: No results for input(s): LABPROT, INR in the last 72 hours. Urine Drug Screen: Drugs of Abuse  No results found for: LABOPIA, COCAINSCRNUR, LABBENZ, AMPHETMU, THCU, LABBARB  Alcohol Level: No results for input(s): ETH in the last 72 hours. Urinalysis: No results for input(s): COLORURINE, LABSPEC, PHURINE, GLUCOSEU, HGBUR, BILIRUBINUR, KETONESUR, PROTEINUR, UROBILINOGEN, NITRITE, LEUKOCYTESUR in the last 72 hours.  Invalid input(s): APPERANCEUR Misc. Labs: BNP: 2462.5  Imaging results:  Dg Chest 2 View  07/03/2015   CLINICAL DATA:  Acute onset of shortness of breath. Initial encounter.  EXAM: CHEST  2 VIEW  COMPARISON:  Chest radiograph performed 06/14/2015  FINDINGS: The lungs are well-aerated. Small bilateral pleural effusions are seen, with bibasilar airspace opacification, likely reflecting pulmonary edema. No pneumothorax is seen.  The heart is enlarged. A pacemaker is noted at the left chest wall, with leads ending at the right atrium and right ventricle. No acute osseous abnormalities are seen.  IMPRESSION: Small bilateral pleural effusions, with bibasilar airspace opacification, likely reflecting pulmonary edema. Underlying cardiomegaly noted.   Electronically Signed   By: Roanna RaiderJeffery  Chang M.D.   On: 07/03/2015 19:33    Other results: EKG: ventricular paced rhythm.  Assessment & Plan by Problem: Active Problems:   Hyperlipidemia   Hypertensive heart disease   CAD S/P PTCA 1995   Intrinsic asthma   CKD stage 5, GFR less than 15 ml/min   PVD (peripheral vascular disease)   Chronic combined systolic and diastolic heart failure   Type 2 diabetes mellitus with renal manifestations    Obesity (BMI 30-39.9)   Anemia due to pre-end-stage renal disease treated with erythropoietin   Complete heart block   Pulmonary HTN   Cardiomyopathy-EF 40-45% by echo May 2016   Vitamin D deficiency   CHF exacerbation  Ms. Franki MonteDeese is a 81 yo female with HFpEF (EF 40-45%), pulmonary hypertension, complete heart block with dual chamber pacemaker s/p lead revision, peripheral vascular disease s/p b/l bypass grafts, CKD stage V, HTN, CAD, DMII, and normocytic anemia, presenting with LE swelling, worsening shortness of breath, and weight gain, and CXR demonstrating bilateral pleural effusions.  Findings are consistent with acute on chronic HFpEF exacerbation.  1. Acute on chronic HFpEF exacerbation: Patient has all the cardinal symptoms of CHF exacerbation in the setting of medication and dietary noncompliance. Will diurese to alleviate pulmonary congestion and leg swelling.  However, worsening shortness of breath and asymmetric leg swelling is concerning for DVT (Wells DVT: 2-3 high).  Will obtain dopplers for evaluation.  Pulmonary embolism less likely, given that she is not hypoxic or tachycardic Anner Crete(Wells PE: 3 Moderate; Modified [place]: 1 Low) - Lasix 40 mg IV once (prescribed home regimen: Lasix 40 mg PO BID)   - Continue Carvedilol 12.5 mg PO BID - Continue Atorvastatin 40 mg daily - Continue ASA 81 mg daily - Nitroglycerin 0.4 mg SL PRN chest pain - Daily weights - Strict I/Os  2. HTN: Patient's BP labile while in the ED.  Will hold home HTN meds for now. - Hold Hydralazine 10 mg BID -  Hold Imdur 30 mg daily  3. DMII: Last A1c 5.8 (05/2015) - Hold Lispro and Januvia - SSI  4. Complete heart block with pacemaker: Patient had cardiology f/u 1 week ago at outpatient clinic. Pacemaker interrogation at that time showed no issues.  Will CTM.  Asthma: Albuterol neb q6hours PRN Vitamin D Def: continue Vit D supplementation Chronic Anemia from CKD: stable CAD: Continue ASA and beta  blocker FEN/GI: - Colace BID - Diet: Renal - IVF: none  DVT Ppx: Heparin  Dispo: Disposition is deferred at this time, awaiting improvement of current medical problems.   The patient does have a current PCP Eliott Nine, MD) and does need an [facility] hospital follow-up appointment after discharge.  The patient does not have transportation limitations that hinder transportation to clinic appointments.  Signed: Jana Half, MD 07/04/2015, 2:02 AM

## 2015-07-04 ENCOUNTER — Inpatient Hospital Stay (HOSPITAL_COMMUNITY): Payer: Medicare Other

## 2015-07-04 ENCOUNTER — Encounter (HOSPITAL_COMMUNITY): Payer: Self-pay | Admitting: General Practice

## 2015-07-04 DIAGNOSIS — E1121 Type 2 diabetes mellitus with diabetic nephropathy: Secondary | ICD-10-CM

## 2015-07-04 DIAGNOSIS — I10 Essential (primary) hypertension: Secondary | ICD-10-CM | POA: Insufficient documentation

## 2015-07-04 DIAGNOSIS — M7989 Other specified soft tissue disorders: Secondary | ICD-10-CM

## 2015-07-04 DIAGNOSIS — I5043 Acute on chronic combined systolic (congestive) and diastolic (congestive) heart failure: Secondary | ICD-10-CM

## 2015-07-04 LAB — CBC
HCT: 29.3 % — ABNORMAL LOW (ref 36.0–46.0)
HEMOGLOBIN: 9.1 g/dL — AB (ref 12.0–15.0)
MCH: 29.4 pg (ref 26.0–34.0)
MCHC: 31.1 g/dL (ref 30.0–36.0)
MCV: 94.5 fL (ref 78.0–100.0)
PLATELETS: 180 10*3/uL (ref 150–400)
RBC: 3.1 MIL/uL — AB (ref 3.87–5.11)
RDW: 14.9 % (ref 11.5–15.5)
WBC: 7 10*3/uL (ref 4.0–10.5)

## 2015-07-04 LAB — GLUCOSE, CAPILLARY
GLUCOSE-CAPILLARY: 88 mg/dL (ref 65–99)
Glucose-Capillary: 101 mg/dL — ABNORMAL HIGH (ref 65–99)
Glucose-Capillary: 113 mg/dL — ABNORMAL HIGH (ref 65–99)
Glucose-Capillary: 121 mg/dL — ABNORMAL HIGH (ref 65–99)
Glucose-Capillary: 132 mg/dL — ABNORMAL HIGH (ref 65–99)

## 2015-07-04 LAB — COMPREHENSIVE METABOLIC PANEL
ALBUMIN: 3.5 g/dL (ref 3.5–5.0)
ALT: 43 U/L (ref 14–54)
AST: 38 U/L (ref 15–41)
Alkaline Phosphatase: 65 U/L (ref 38–126)
Anion gap: 11 (ref 5–15)
BUN: 54 mg/dL — AB (ref 6–20)
CALCIUM: 9.1 mg/dL (ref 8.9–10.3)
CO2: 25 mmol/L (ref 22–32)
CREATININE: 3.61 mg/dL — AB (ref 0.44–1.00)
Chloride: 102 mmol/L (ref 101–111)
GFR calc Af Amer: 12 mL/min — ABNORMAL LOW (ref >60)
GFR calc non Af Amer: 10 mL/min — ABNORMAL LOW (ref >60)
GLUCOSE: 155 mg/dL — AB (ref 65–99)
Potassium: 4.1 mmol/L (ref 3.5–5.1)
SODIUM: 138 mmol/L (ref 135–145)
TOTAL PROTEIN: 6.7 g/dL (ref 6.5–8.1)
Total Bilirubin: 0.7 mg/dL (ref 0.3–1.2)

## 2015-07-04 MED ORDER — NITROGLYCERIN 0.4 MG SL SUBL: 0.4000 mg | SUBLINGUAL_TABLET | SUBLINGUAL | Status: DC | PRN

## 2015-07-04 MED ORDER — RENA-VITE PO TABS
1.0000 | ORAL_TABLET | Freq: Every day | ORAL | Status: DC
  Administered 2015-07-04 (×2): 1 via ORAL
  Filled 2015-07-04 (×3): qty 1

## 2015-07-04 MED ORDER — DOCUSATE SODIUM 100 MG PO CAPS
100.0000 mg | ORAL_CAPSULE | Freq: Two times a day (BID) | ORAL | Status: DC
  Administered 2015-07-04 – 2015-07-05 (×3): 100 mg via ORAL
  Filled 2015-07-04 (×4): qty 1

## 2015-07-04 MED ORDER — ASPIRIN 81 MG PO CHEW
81.0000 mg | CHEWABLE_TABLET | Freq: Every day | ORAL | Status: DC
  Administered 2015-07-04 – 2015-07-05 (×2): 81 mg via ORAL
  Filled 2015-07-04 (×2): qty 1

## 2015-07-04 MED ORDER — PARICALCITOL 1 MCG PO CAPS
1.0000 ug | ORAL_CAPSULE | Freq: Every day | ORAL | Status: DC
  Administered 2015-07-04 – 2015-07-05 (×2): 1 ug via ORAL
  Filled 2015-07-04 (×2): qty 1

## 2015-07-04 MED ORDER — FUROSEMIDE 10 MG/ML IJ SOLN
40.0000 mg | Freq: Once | INTRAMUSCULAR | Status: AC
  Administered 2015-07-04: 40 mg via INTRAVENOUS
  Filled 2015-07-04: qty 4

## 2015-07-04 MED ORDER — CARVEDILOL 12.5 MG PO TABS
12.5000 mg | ORAL_TABLET | Freq: Two times a day (BID) | ORAL | Status: DC
  Filled 2015-07-04: qty 1

## 2015-07-04 MED ORDER — ALBUTEROL SULFATE (2.5 MG/3ML) 0.083% IN NEBU: 2.5000 mg | INHALATION_SOLUTION | Freq: Four times a day (QID) | RESPIRATORY_TRACT | Status: DC | PRN

## 2015-07-04 MED ORDER — ISOSORBIDE MONONITRATE ER 30 MG PO TB24
30.0000 mg | ORAL_TABLET | Freq: Every day | ORAL | Status: DC
  Administered 2015-07-04 – 2015-07-05 (×2): 30 mg via ORAL
  Filled 2015-07-04 (×2): qty 1

## 2015-07-04 MED ORDER — RENA-VITE RX 1 MG PO TABS: 1.0000 | ORAL_TABLET | Freq: Every morning | ORAL | Status: DC

## 2015-07-04 MED ORDER — FUROSEMIDE 10 MG/ML IJ SOLN
80.0000 mg | Freq: Once | INTRAMUSCULAR | Status: AC
  Administered 2015-07-04: 80 mg via INTRAVENOUS
  Filled 2015-07-04: qty 8

## 2015-07-04 MED ORDER — CARVEDILOL 12.5 MG PO TABS
12.5000 mg | ORAL_TABLET | Freq: Two times a day (BID) | ORAL | Status: DC
  Administered 2015-07-04 – 2015-07-05 (×3): 12.5 mg via ORAL
  Filled 2015-07-04 (×5): qty 1

## 2015-07-04 MED ORDER — ATORVASTATIN CALCIUM 40 MG PO TABS
40.0000 mg | ORAL_TABLET | Freq: Every day | ORAL | Status: DC
  Administered 2015-07-04 – 2015-07-05 (×2): 40 mg via ORAL
  Filled 2015-07-04 (×2): qty 1

## 2015-07-04 MED ORDER — ASPIRIN 81 MG PO TABS: 81.0000 mg | ORAL_TABLET | Freq: Every morning | ORAL | Status: DC

## 2015-07-04 MED ORDER — INSULIN ASPART 100 UNIT/ML ~~LOC~~ SOLN
0.0000 [IU] | Freq: Three times a day (TID) | SUBCUTANEOUS | Status: DC
  Administered 2015-07-04: 1 [IU] via SUBCUTANEOUS
  Administered 2015-07-05: 2 [IU] via SUBCUTANEOUS

## 2015-07-04 MED ORDER — INSULIN ASPART 100 UNIT/ML ~~LOC~~ SOLN: 0.0000 [IU] | Freq: Every day | SUBCUTANEOUS | Status: DC

## 2015-07-04 MED ORDER — FUROSEMIDE 10 MG/ML IJ SOLN
80.0000 mg | Freq: Two times a day (BID) | INTRAMUSCULAR | Status: DC
Start: 2015-07-04 — End: 2015-07-05
  Administered 2015-07-04: 80 mg via INTRAVENOUS
  Filled 2015-07-04 (×3): qty 8

## 2015-07-04 MED ORDER — LORATADINE 10 MG PO TABS
10.0000 mg | ORAL_TABLET | Freq: Every day | ORAL | Status: DC | PRN
  Filled 2015-07-04: qty 1

## 2015-07-04 NOTE — Progress Notes (Signed)
VASCULAR LAB PRELIMINARY  PRELIMINARY  PRELIMINARY  PRELIMINARY  Bilateral lower extremity venous duplex completed.    Preliminary report:  Bilateral:  No evidence of DVT, superficial thrombosis, or Baker's Cyst. Mild to moderate interstitial fluid noted throughout the lower legs right greater than left   Dorothy Bartlett, RVS 07/04/2015, 9:00 AM

## 2015-07-04 NOTE — Progress Notes (Signed)
Received pt report from Barbara, RN -ED. 

## 2015-07-04 NOTE — Telephone Encounter (Signed)
Increase furosemide to 80 mg daily for 3 days or less if swelling resolves.

## 2015-07-04 NOTE — Progress Notes (Signed)
Claretta FraiseCorine H Matsuura [id number] Admission Data: 07/04/2015 12:39 AM Attending Provider: Doneen PoissonLawrence Klima, MD WUJ:[id number]:LEAGUE-SOBON, Victorino DikeJENNIFER, MD Code Status: Full  Jadene Rushie ChestnutH Crupi is a 81 y.o. female patient admitted from ED:  -No acute distress noted.  -No complaints of shortness of breath.  -No complaints of chest pain.   Cardiac Monitoring: Box # 3 in place. Cardiac monitor yields:V paced.  Blood pressure 137/66, pulse 61, temperature 98.3 F (36.8 C), temperature source Oral, resp. rate 19, height 4\' 11"  (1.499 m), weight 83.689 kg (184 lb 8 oz), SpO2 100 %.   IV Fluids:  IV in place, occlusive dsg intact without redness, IV cath antecubital left, condition patent and no redness none.   Allergies:  Review of patient's allergies indicates no known allergies.  Past Medical History:   has a past medical history of Hyperlipidemia; Carotid stenosis; CKD (chronic kidney disease), stage IV; Hypertension; Coronary artery disease; Obesity (BMI 30-39.9); Anemia due to pre-end-stage renal disease treated with erythropoietin (05/08/2015); Type 2 diabetes mellitus with renal manifestations; Intrinsic asthma (01/28/2008); Old anterolateral myocardial infarction; PVD (peripheral vascular disease) (02/16/2014); Chronic combined systolic and diastolic heart failure (04/13/2014); Complete heart block; and echocardiogram.  Past Surgical History:   has past surgical history that includes repair of nerve in left arm  (Left, 2000); Cardiac catheterization (1995); Femoral-tibial Bypass Graft (Left, 02/24/2014); Femoral-popliteal Bypass Graft (Right, 08/04/2014); Amputation (Right, 08/04/2014); lower extremity angiogram (Left, 01/08/2014); abdominal angiogram (01/08/2014); lower extremity angiogram (Right, 08/03/2014); abdominal aortagram (N/A, 10/25/2014); Cardiac catheterization (N/A, 05/10/2015); Cardiac catheterization (N/A, 06/13/2015); and Pacemaker lead removal (06/13/2015).  Social History:   reports that she has never smoked. She has  never used smokeless tobacco. She reports that she does not drink alcohol or use illicit drugs.  Skin: NSI  Patient/Family orientated to room. Information packet given to patient/family. Admission inpatient armband information verified with patient/family to include name and date of birth and placed on patient arm. Side rails up x 2, fall assessment and education completed with patient/family. Patient/family able to verbalize understanding of risk associated with falls and verbalized understanding to call for assistance before getting out of bed. Call light within reach. Patient/family able to voice and demonstrate understanding of unit orientation instructions.

## 2015-07-04 NOTE — Progress Notes (Signed)
Utilization Review Completed.Dorothy Bartlett T7/04/2015  

## 2015-07-04 NOTE — Progress Notes (Signed)
Internal Medicine Attending  Date: 07/04/2015  Patient name: Dorothy FraiseCorine H Pascal Medical record number: [id number] Date of birth: [birth year] Age: 81 y.o. Gender: female  I saw and evaluated the patient. I reviewed the resident's note by Dr. Isabella BowensKrall and I agree with the resident's findings and plans as documented in her progress note.  Please see my H&P dated 07/04/2015 and attached to Dr. Lubertha Basqueaylor's H&P dated 07/03/2015 for the specifics of my evaluation, assessment, and plan from earlier today.

## 2015-07-04 NOTE — Progress Notes (Signed)
Subjective: No acute events overnight. She reports her dyspnea has improved. She denies chest pain.  Objective: Vital signs in last 24 hours: Filed Vitals:   07/04/15 0000 07/04/15 0028 07/04/15 0520 07/04/15 1033  BP: 119/54 137/66 134/70 138/66  Pulse: 62 61 65 62  Temp:  98.3 F (36.8 C) 98.2 F (36.8 C) 98.1 F (36.7 C)  TempSrc:  Oral Oral Oral  Resp: Height:   (1.499 m)    Weight:  184 lb 8 oz (83.689 kg)    SpO2: 99% 100% 100% 100%   Weight change:   Intake/Output Summary (Last 24 hours) at 07/04/15 1337 Last data filed at 07/04/15 1316  Gross per 24 hour  Intake    870 ml  Output   1000 ml  Net   -130 ml   General Apperance: NAD HEENT: Normocephalic, atraumatic, PERRL, EOMI, anicteric sclera Neck: Supple, trachea midline Lungs: Clear to auscultation bilaterally. No wheezes, rhonchi or rales. Breathing comfortably Heart: Regular rate and rhythm, no murmur/rub/gallop Abdomen: Soft, nontender, nondistended, no rebound/guarding Extremities: Normal, atraumatic, warm and well perfused, 2+ pitting edema Skin: No rashes or lesions Neurologic: Alert and interactive. No gross deficits.  Lab Results: Basic Metabolic Panel:  Recent Labs Lab 07/03/15 1950 07/04/15 0500  NA 137 138  K 4.4 4.1  CL 101 102  CO2 23 25  GLUCOSE 205* 155*  BUN 57* 54*  CREATININE 3.61* 3.61*  CALCIUM 9.3 9.1   Liver Function Tests:  Recent Labs Lab 07/04/15 0500  AST 38  ALT 43  ALKPHOS 65  BILITOT 0.7  PROT 6.7  ALBUMIN 3.5   CBC:  Recent Labs Lab 07/03/15 1950 07/04/15 0500  WBC 6.3 7.0  HGB 9.9* 9.1*  HCT 31.4* 29.3*  MCV 94.0 94.5  PLT 184 180   Cardiac Enzymes: No results for input(s): CKTOTAL, CKMB, CKMBINDEX, TROPONINI in the last 168 hours.  CBG:  Recent Labs Lab 07/04/15 0048 07/04/15 0628 07/04/15 1143  GLUCAP 132* 121* 101*   Studies/Results: Dg Chest 2 View  07/03/2015   CLINICAL DATA:  Acute onset of shortness of  breath. Initial encounter.  EXAM: CHEST  2 VIEW  COMPARISON:  Chest radiograph performed 06/14/2015  FINDINGS: The lungs are well-aerated. Small bilateral pleural effusions are seen, with bibasilar airspace opacification, likely reflecting pulmonary edema. No pneumothorax is seen.  The heart is enlarged. A pacemaker is noted at the left chest wall, with leads ending at the right atrium and right ventricle. No acute osseous abnormalities are seen.  IMPRESSION: Small bilateral pleural effusions, with bibasilar airspace opacification, likely reflecting pulmonary edema. Underlying cardiomegaly noted.   Electronically Signed   By: Roanna Raider M.D.   On: 07/03/2015 19:33   Medications: I have reviewed the patient's current medications. Scheduled Meds: . aspirin  81 mg Oral Daily  . atorvastatin  40 mg Oral q1800  . carvedilol  12.5 mg Oral BID WC  . docusate sodium  100 mg Oral BID  . heparin  5,000 Units Subcutaneous 3 times per day  . insulin aspart  0-5 Units Subcutaneous QHS  . insulin aspart  0-9 Units Subcutaneous TID WC  . multivitamin  1 tablet Oral QHS  . paricalcitol  1 mcg Oral Daily   Continuous Infusions:  PRN Meds:.albuterol, loratadine, nitroGLYCERIN Assessment/Plan:  Acute on chronic HFpEF exacerbation: Likely 2/2 medication and dietary noncompliance. She received Lasix  x2 on admission with 400 cc UOP. Dry weight around 176lb.  -  Lasix 80mg  IV BID - Continue Carvedilol 12.5 mg PO BID - Continue Atorvastatin 40 mg daily - Continue ASA 81 mg daily - Nitroglycerin 0.4 mg SL PRN chest pain - Daily weights - Strict I/Os - Palliative care recommended by outpatient cardiologist. Discussed with patient's daughter, and she is agreeable to talking to inpatient palliative care team to get hospice care initiated.  HTN: BP 138/66. - Hold Hydralazine 10 mg BID - Restart Imdur 30 mg daily  DMII: Last A1c 5.8 (05/2015) - Hold home Lispro and Januvia - SSI  Complete heart block  with pacemaker: Continue to monitor  Asthma: Albuterol neb q6hours PRN  Vitamin D Def: continue Vit D supplementation  Chronic Anemia from CKD: stable  CAD: Continue ASA and beta blocker  FEN: Renal  VTE Ppx: Heparin  Dispo: Disposition is deferred at this time, awaiting improvement of current medical problems.  Anticipated discharge in approximately 1-2 day(s).   The patient does have a current PCP [facility] Nine(Jennifer League-Sobon, MD) and does not [facility] after discharge.  The patient does not know have transportation limitations that hinder transportation to clinic appointments.  .Services Needed at time of discharge: Y = Yes, Blank = No PT:   OT:   RN:   Equipment:   Other:     LOS: 1 day   Lora PaulaJennifer T Krall, MD 07/04/2015, 1:37 PM

## 2015-07-05 ENCOUNTER — Inpatient Hospital Stay (HOSPITAL_COMMUNITY): Payer: Medicare Other

## 2015-07-05 ENCOUNTER — Telehealth: Payer: Self-pay

## 2015-07-05 DIAGNOSIS — I132 Hypertensive heart and chronic kidney disease with heart failure and with stage 5 chronic kidney disease, or end stage renal disease: Principal | ICD-10-CM

## 2015-07-05 DIAGNOSIS — I5043 Acute on chronic combined systolic (congestive) and diastolic (congestive) heart failure: Secondary | ICD-10-CM

## 2015-07-05 DIAGNOSIS — N185 Chronic kidney disease, stage 5: Secondary | ICD-10-CM

## 2015-07-05 DIAGNOSIS — E1122 Type 2 diabetes mellitus with diabetic chronic kidney disease: Secondary | ICD-10-CM

## 2015-07-05 DIAGNOSIS — Z515 Encounter for palliative care: Secondary | ICD-10-CM

## 2015-07-05 LAB — BASIC METABOLIC PANEL
Anion gap: 14 (ref 5–15)
BUN: 60 mg/dL — AB (ref 6–20)
CO2: 23 mmol/L (ref 22–32)
CREATININE: 3.39 mg/dL — AB (ref 0.44–1.00)
Calcium: 8.8 mg/dL — ABNORMAL LOW (ref 8.9–10.3)
Chloride: 102 mmol/L (ref 101–111)
GFR calc Af Amer: 13 mL/min — ABNORMAL LOW (ref >60)
GFR, EST NON AFRICAN AMERICAN: 11 mL/min — AB (ref >60)
Glucose, Bld: 76 mg/dL (ref 65–99)
POTASSIUM: 4.1 mmol/L (ref 3.5–5.1)
Sodium: 139 mmol/L (ref 135–145)

## 2015-07-05 LAB — GLUCOSE, CAPILLARY
Glucose-Capillary: 81 mg/dL (ref 65–99)
Glucose-Capillary: 102 mg/dL — ABNORMAL HIGH (ref 65–99)
Glucose-Capillary: 187 mg/dL — ABNORMAL HIGH (ref 65–99)

## 2015-07-05 LAB — CBC
HCT: 27.7 % — ABNORMAL LOW (ref 36.0–46.0)
HEMOGLOBIN: 8.7 g/dL — AB (ref 12.0–15.0)
MCH: 29.8 pg (ref 26.0–34.0)
MCHC: 31.4 g/dL (ref 30.0–36.0)
MCV: 94.9 fL (ref 78.0–100.0)
PLATELETS: 168 10*3/uL (ref 150–400)
RBC: 2.92 MIL/uL — ABNORMAL LOW (ref 3.87–5.11)
RDW: 14.6 % (ref 11.5–15.5)
WBC: 6.8 10*3/uL (ref 4.0–10.5)

## 2015-07-05 MED ORDER — FUROSEMIDE 40 MG PO TABS
40.0000 mg | ORAL_TABLET | Freq: Every day | ORAL | Status: DC
  Filled 2015-07-05: qty 1

## 2015-07-05 MED ORDER — HYDRALAZINE HCL 10 MG PO TABS
10.0000 mg | ORAL_TABLET | Freq: Two times a day (BID) | ORAL | Status: DC
  Administered 2015-07-05: 10 mg via ORAL
  Filled 2015-07-05 (×3): qty 1

## 2015-07-05 MED ORDER — FUROSEMIDE 40 MG PO TABS: 40.0000 mg | ORAL_TABLET | Freq: Two times a day (BID) | ORAL | Status: DC

## 2015-07-05 MED ORDER — FUROSEMIDE 40 MG PO TABS
40.0000 mg | ORAL_TABLET | Freq: Two times a day (BID) | ORAL | Status: DC
  Administered 2015-07-05 (×2): 40 mg via ORAL
  Filled 2015-07-05 (×3): qty 1

## 2015-07-05 NOTE — Progress Notes (Signed)
[facility] called for home oxygen to be delivered to the room today prior to going home; Alexis GoodellB Limuel Nieblas RN,BSN,MHA [phone]

## 2015-07-05 NOTE — Care Management (Signed)
Important Message  Patient Details  Name: Claretta FraiseCorine H Mcnairy MRN: [id number] Date of Birth: 1926/04/24   Medicare Important Message Given:  Yes-second notification given    Bernadette HoitShoffner, Aija Scarfo Coleman 07/05/2015, 10:32 AM

## 2015-07-05 NOTE — Telephone Encounter (Signed)
Spoke with daughter to increase Lasix to 80 mg for 3 days unless swelling decreases as ordered per Dr. Katrinka BlazingSmith.  Daughter said Sunday night she felt short of breath and took her to the ED.  She was admitted to the hospital.

## 2015-07-05 NOTE — Consult Note (Signed)
Consultation Note Date: 07/05/2015   Patient Name: Dorothy Bartlett  DOB: 02-09-1926  MRN: [id number]  Age / Sex: 81 y.o., female   PCP: Daphene Calamity, MD Referring Physician: Oval Linsey, MD  Reason for Consultation: Establishing goals of care  Palliative Care Assessment and Plan Summary of Established Goals of Care and Medical Treatment Preferences    Palliative Care Discussion Held Today:   I met today with [name] and daughter, [name], at bedside. They have many questions regarding hospice care at home and express that they want to honor her wishes to stay in her home and are tired of coming back and forth to the hospice. Discussed hospice philosophy of comfort and quality of life. They have decided:  - NO dialysis - Do not want to keep coming back to the hospital - Focus on QOL and keeping her at home - Will discuss with rest of family code status. Educated [name] that if she were to arrest then it is very unlikely that she would live and especially recover to the QOL she has now especially since her heart is already so weak. Hard Choices given to help facilitate more conversation.    Contacts/Participants in Discussion: Primary Decision Maker: [name] participate but appears to be relying on daughter, [name], for decisions. There are 5 other children living. No official HCPOA.    Goals of Care/Code Status/Advance Care Planning:   Code Status: FULL - they are considering options.    Symptom Management:   No symptoms to manage at this time. The RN came in to d/c oxygen and her work of breathing did seem to increase without oxygen even at rest.   Psycho-social/Spiritual:   Support System: Children are very supportive. Also has assistance from friends/church friends that visit and check on her.   Prognosis: < 6 months  Discharge Planning:  Home with Hospice       Chief Complaint: SOB, swelling  History of Present Illness: 81 yo woman with a history  of chronic systolic and diastolic heart failure, peripheral vascular occlusive disease, stage V chronic kidney disease, coronary artery disease, type 2 diabetes, and hypertension who was recently discharged from the hospital in mid June for an exacerbation of her chronic heart failure related to lead malfunction. She had a revision of her lead and was mildly diuresed towards her baseline weight of approximately 174 pounds. Since discharge she admits to eating prepared foods from her neighbors which likely contained more sodium than she should have. She denies increasing her liquid intake but states she has been taking her Lasix daily rather than twice daily as was prescribed upon discharge. She denies any fevers, shakes, chills, or chest pain. She does note orthopnea, but denies paroxysmal nocturnal dyspnea.   Primary Diagnoses  Present on Admission:  . Intrinsic asthma . PVD (peripheral vascular disease) . Type 2 diabetes mellitus with renal manifestations . Chronic combined systolic and diastolic heart failure . Hypertensive heart disease . Hyperlipidemia . Obesity (BMI 30-39.9) . 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 Pulmonary HTN . Vitamin D deficiency . Complete heart block . CKD stage 5, GFR less than 15 ml/min  Palliative Review of Systems:   Denies pain, shortness of breath currently. + Shortness of breath with minimal activity at home.    I have reviewed the medical record, interviewed the patient and family, and examined the patient. The following aspects are pertinent.  Past Medical History  Diagnosis Date  . Hyperlipidemia   .  Carotid stenosis     Carotid US (12/2013): < 94% RCA, < 70% LICA.  [name] Hypertension   . Coronary artery disease     a. s/p MI 1995 tx w/ PTCA to Dx, Ramus, ap LAD;  b. LHC (11/2004):  Inferior HK, EF 60%, proximal LAD 50%, ostial D1 80%, mid D1 95%, mid LAD 50-60%, distal LAD at the apex 90%, OM1 occluded (fills late by  left to left collaterals), RCA 75%. Medical therapy recommended.;  c. Lexiscan Myoview (12/2013):  Prior ant-lat scar with very small area of peri-infarct ischemia in ant wall, EF 40 (high risk)   . Obesity (BMI 30-39.9)   . Intrinsic asthma 01/28/2008     Mild intermittant asthma, PFT's 01/28/08     . Old anterolateral myocardial infarction   . PVD (peripheral vascular disease) 02/16/2014    2015, right femoral peroneal bypass grafting and left femoral-tibial bypass grafting by Dr. Donnetta Hutching for rest pain as well as nonhealing ulcer on toe accompanied with toe amputation  Arteriogram October 2015 with patent graft on the right, 70% stenosis of graft on the left proximally.   . Chronic combined systolic and diastolic heart failure 7/61/5183  . Complete heart block     a. 05/2015 s/p [facility] [employer] DR model [id number] dual chamber PPM (serial number [id number]) (Dr. Rayann Heman).  [name] Hx of echocardiogram     a. Echo 5/16: dist septal, dist ant, dist inf and apical AK, EF 40-45%, mod MR, mild LAE, PASP 72  . Chronic kidney disease (CKD), stage V     notes 07/03/2015; Dr. Justin Mend  . 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 Type 2 diabetes mellitus with renal manifestations   . Presence of permanent cardiac pacemaker    History   Social History  . Marital Status: Widowed    Spouse Name: N/A  . Number of Children: N/A  . Years of Education: N/A   Occupational History  . Retired    Social History Main Topics  . Smoking status: Never Smoker   . Smokeless tobacco: Never Used  . Alcohol Use: No  . Drug Use: No  . Sexual Activity: No   Other Topics Concern  . None   Social History Narrative   Tobacco use cigarettes: Former Smoker   Occupation: Unemployed, retired   Marital Status: Single   Children: 2 daughters   Family History  Problem Relation Age of Onset  . Diabetes Father   . Hypertension Father   . Hypertension Daughter   . Hyperlipidemia  Daughter   . Hyperlipidemia Son   . Hypertension Sister    Scheduled Meds: . aspirin  81 mg Oral Daily  . atorvastatin  40 mg Oral q1800  . carvedilol  12.5 mg Oral BID WC  . docusate sodium  100 mg Oral BID  . furosemide  40 mg Oral BID  . heparin  5,000 Units Subcutaneous 3 times per day  . hydrALAZINE  10 mg Oral BID  . insulin aspart  0-5 Units Subcutaneous QHS  . insulin aspart  0-9 Units Subcutaneous TID WC  . isosorbide mononitrate  30 mg Oral Daily  . multivitamin  1 tablet Oral QHS  . paricalcitol  1 mcg Oral Daily   Continuous Infusions:  PRN Meds:.albuterol, loratadine, nitroGLYCERIN Medications Prior to Admission:  Prior to Admission medications   Medication Sig Start Date End Date Taking? Authorizing Provider  aspirin 81 MG tablet Take 81 mg by mouth  every morning.    Yes Historical Provider, MD  atorvastatin (LIPITOR) 40 MG tablet Take 40 mg by mouth every morning.  01/25/14  Yes Historical Provider, MD  B Complex-C-Folic Acid (RENA-VITE RX) 1 MG TABS Take 1 tablet by mouth every morning.  02/05/14  Yes Historical Provider, MD  carvedilol (COREG) 12.5 MG tablet Take 1 tablet (12.5 mg total) by mouth 2 (two) times daily. 02/04/14  Yes Belva Crome, MD  docusate sodium (COLACE) 100 MG capsule Take 100 mg by mouth 2 (two) times daily.    Yes Historical Provider, MD  hydrALAZINE (APRESOLINE) 10 MG tablet Take 1 tablet by mouth 2 (two) times daily. 05/25/15  Yes Historical Provider, MD  isosorbide mononitrate (IMDUR) 30 MG 24 hr tablet Take 30 mg by mouth daily.   Yes Historical Provider, MD  loratadine (CLARITIN) 10 MG tablet Take 10 mg by mouth daily as needed for allergies.   Yes Historical Provider, MD  paricalcitol (ZEMPLAR) 1 MCG capsule Take 1 mcg by mouth daily.  04/07/14  Yes Historical Provider, MD  sitaGLIPtin (JANUVIA) 25 MG tablet Take 25 mg by mouth daily.   Yes Historical Provider, MD  Vitamin D, Ergocalciferol, (DRISDOL) 50000 UNITS CAPS capsule Take 1 capsule  (50,000 Units total) by mouth every 7 (seven) days. 06/15/15  Yes Luan Moore, MD  albuterol (PROVENTIL HFA;VENTOLIN HFA) 108 (90 BASE) MCG/ACT inhaler Inhale 2 puffs into the lungs every 6 (six) hours as needed for wheezing or shortness of breath.    Historical Provider, MD  furosemide (LASIX) 40 MG tablet Take 1 tablet (40 mg total) by mouth 2 (two) times daily. 07/05/15   Milagros Loll, MD  insulin lispro (HUMALOG) 100 UNIT/ML injection Inject 5 Units into the skin 2 (two) times daily as needed for high blood sugar. Only takes if CBG >250.    Historical Provider, MD  Melatonin 3 MG TABS Take 3 mg by mouth at bedtime as needed (for sleep).     Historical Provider, MD  nitroGLYCERIN (NITROSTAT) 0.4 MG SL tablet Place 1 tablet (0.4 mg total) under the tongue every 5 (five) minutes x 3 doses as needed for chest pain. 06/17/15   Belva Crome, MD   No Known Allergies CBC:    Component Value Date/Time   WBC 6.8 07/05/2015 0437   WBC 8.4 09/03/2008 1100   HGB 8.7* 07/05/2015 0437   HGB 11.3* 09/03/2008 1100   HCT 27.7* 07/05/2015 0437   HCT 34.3* 09/03/2008 1100   PLT 168 07/05/2015 0437   PLT 146 09/03/2008 1100   MCV 94.9 07/05/2015 0437   MCV 93.5 09/03/2008 1100   NEUTROABS 6.7 05/08/2015 1314   NEUTROABS 5.3 09/03/2008 1100   LYMPHSABS 2.1 05/08/2015 1314   LYMPHSABS 2.4 09/03/2008 1100   MONOABS 0.8 05/08/2015 1314   MONOABS 0.6 09/03/2008 1100   EOSABS 0.0 05/08/2015 1314   EOSABS 0.1 09/03/2008 1100   BASOSABS 0.0 05/08/2015 1314   BASOSABS 0.0 09/03/2008 1100   Comprehensive Metabolic Panel:    Component Value Date/Time   NA 139 07/05/2015 0437   K 4.1 07/05/2015 0437   CL 102 07/05/2015 0437   CO2 23 07/05/2015 0437   BUN 60* 07/05/2015 0437   CREATININE 3.39* 07/05/2015 0437   GLUCOSE 76 07/05/2015 0437   CALCIUM 8.8* 07/05/2015 0437   AST 38 07/04/2015 0500   ALT 43 07/04/2015 0500   ALKPHOS 65 07/04/2015 0500   BILITOT 0.7 07/04/2015 0500   PROT 6.7 07/04/2015  0500   ALBUMIN 3.5 07/04/2015 0500    Physical Exam:  Vital Signs: BP 128/52 mmHg  Pulse 62  Temp(Src) 98.6 F (37 C) (Oral)  Resp 18  Ht _0  (1.499 m)  Wt 79.833 kg (176 lb)  BMI 35.53 kg/m2  SpO2 100% SpO2: SpO2: 100 % O2 Device: O2 Device: Nasal Cannula O2 Flow Rate: O2 Flow Rate (L/min): 2 L/min Intake/output summary:  Intake/Output Summary (Last 24 hours) at 07/05/15 1332 Last data filed at 07/05/15 0917  Gross per 24 hour  Intake    700 ml  Output   1850 ml  Net  -1150 ml   LBM: Last BM Date: 07/03/15 Baseline Weight: Weight: 83.235 kg (183 lb 8 oz) Most recent weight: Weight: 79.833 kg (176 lb) (scale a)  Exam Findings:   General: NAD, sitting up in recliner HEENT: [place]/AT CVS: RRR Resp: No labored breathing, work of breathing appears to increase slightly without oxygen Abd: Soft, NT Neuro: Awake, alert, oriented x 3           Palliative Performance Scale: 30 %                Additional Data Reviewed: Recent Labs     07/04/15  0500  07/05/15  0437  WBC  7.0  6.8  HGB  9.1*  8.7*  PLT  180  168  NA  138  139  BUN  54*  60*  CREATININE  3.61*  3.39*     Time In: 1240 Time Out: 1340 Time Total: 80mn  Greater than 50%  of this time was spent counseling and coordinating care related to the above assessment and plan.  Discussed plan of care with Dr. KRandell Patient   Signed by:  AVinie Sill NP Palliative Medicine Team Pager # [phone](M-F 8a-5p) Team Phone # [phone](Nights/Weekends)

## 2015-07-05 NOTE — Progress Notes (Signed)
Prior to ambulation O2 Sat was 94-96 % RA .Pt ambulated without oxygen,  O2 Sat was 94 % then drop to 88-895 and went back up to 94-95 % without oxygen.  Pt C/o transient  SOB and fatigue when ambulated about 150 yards in the hall way. Pt sat in a chair for awhile and walked back to the room in a chair with  Rolling walker and minimal assist.the patient denied SOB at present when sitting in the chair without oxygen at present.  O2 Sat is 94 %  room air RN will conrtinue to monitor pt

## 2015-07-05 NOTE — Discharge Summary (Signed)
Name: Dorothy Bartlett MRN: [id number] DOB: October 12, 1926 81 y.o. PCP: Eliott Nine, MD  Date of Admission: 07/03/2015  6:58 PM Date of Discharge: 07/05/2015 Attending Physician: Doneen Poisson, MD  Discharge Diagnosis: Principal Problem:   Acute on chronic combined systolic and diastolic heart failure Active Problems:   Hypertensive heart disease   CKD stage 5, GFR less than 15 ml/min   Type 2 diabetes mellitus with renal manifestations   Palliative care encounter  Discharge Medications:   Medication List    STOP taking these medications        sitaGLIPtin 25 MG tablet  Commonly known as:  JANUVIA      TAKE these medications        albuterol 108 (90 BASE) MCG/ACT inhaler  Commonly known as:  PROVENTIL HFA;VENTOLIN HFA  Inhale 2 puffs into the lungs every 6 (six) hours as needed for wheezing or shortness of breath.     aspirin 81 MG tablet  Take 81 mg by mouth every morning.     atorvastatin 40 MG tablet  Commonly known as:  LIPITOR  Take 40 mg by mouth every morning.     carvedilol 12.5 MG tablet  Commonly known as:  COREG  Take 1 tablet (12.5 mg total) by mouth 2 (two) times daily.     docusate sodium 100 MG capsule  Commonly known as:  COLACE  Take 100 mg by mouth 2 (two) times daily.     furosemide 40 MG tablet  Commonly known as:  LASIX  Take 1 tablet (40 mg total) by mouth 2 (two) times daily.     hydrALAZINE 10 MG tablet  Commonly known as:  APRESOLINE  Take 1 tablet by mouth 2 (two) times daily.     insulin lispro 100 UNIT/ML injection  Commonly known as:  HUMALOG  Inject 5 Units into the skin 2 (two) times daily as needed for high blood sugar. Only takes if CBG >250.     isosorbide mononitrate 30 MG 24 hr tablet  Commonly known as:  IMDUR  Take 30 mg by mouth daily.     loratadine 10 MG tablet  Commonly known as:  CLARITIN  Take 10 mg by mouth daily as needed for allergies.     Melatonin 3 MG Tabs  Take 3 mg by mouth at bedtime as  needed (for sleep).     nitroGLYCERIN 0.4 MG SL tablet  Commonly known as:  NITROSTAT  Place 1 tablet (0.4 mg total) under the tongue every 5 (five) minutes x 3 doses as needed for chest pain.     paricalcitol 1 MCG capsule  Commonly known as:  ZEMPLAR  Take 1 mcg by mouth daily.     RENA-VITE RX 1 MG Tabs  Take 1 tablet by mouth every morning.     Vitamin D (Ergocalciferol) 50000 UNITS Caps capsule  Commonly known as:  DRISDOL  Take 1 capsule (50,000 Units total) by mouth every 7 (seven) days.        Disposition and follow-up:   Ms.Dorothy Bartlett was discharged from [facility] in Stable condition.  At the hospital follow up visit please address:  1.  Acute on chronic HFpEF exacerbation: Her weight came down to 176 prior to discharge. She was restarted on her home regimen of Lasix 40mg  PO BID. She was ambulated with pulse ox: patient Saturations on room air at rest = 94%, while ambulating = 88%, saturations on 2 liters of oxygen while  ambulating =100 %. She was provided with an oxygen tank for home.   DMII: Her blood glucoses remained between 76 and 187 on sliding scale insulin. She likely does not require Januvia to maintain her hgb A1c goal. She was instructed to hold Januvia and follow up with her PCP for further instructions on her diabetes regimen.  Stage V chronic kidney disease: The patient is not interested in dialysis. She was discharged home with hospice.  2.  Labs / imaging needed at time of follow-up: None  3.  Pending labs/ test needing follow-up: None  Follow-up Appointments:     Follow-up Information    Follow up with Eliott Nine, MD. Go on 07/06/2015.   Why:  1 PM for hospital follow up   Contact information:   [facility] at [facility]. [address]  [place] , [place] [address]  Phone: [phone]      Follow up with Tereso Newcomer, PA-C. Go on 07/12/2015.   Specialties:  Physician Assistant, Radiology,  Interventional Cardiology   Why:  2 PM for cardiology follow up   Contact information:   [address] [place] [address] [phone]       Discharge Instructions: Discharge Instructions    Call MD for:  difficulty breathing, headache or visual disturbances    Complete by:  As directed      Call MD for:  persistant dizziness or light-headedness    Complete by:  As directed      Call MD for:  persistant nausea and vomiting    Complete by:  As directed      Call MD for:  severe uncontrolled pain    Complete by:  As directed      Call MD for:  temperature >100.4    Complete by:  As directed      Diet - low sodium heart healthy    Complete by:  As directed      Increase activity slowly    Complete by:  As directed            Consultations: Treatment Team:  Palliative [facility]  Procedures Performed:  Dg Chest 2 View  07/05/2015   CLINICAL DATA:  Shortness of breath, CHF and pleural effusions.  EXAM: CHEST  2 VIEW  COMPARISON:  PA and lateral chest of July 03, 2015  FINDINGS: The lungs are slightly better inflated. There remain small bilateral pleural effusions. Patchy increased density in the retrocardiac region persists consistent with subsegmental atelectasis. The cardiac silhouette remains enlarged. The pulmonary vascularity remains engorged. The permanent pacemaker is in reasonable position radiographically.  IMPRESSION: Slight improvement in CHF. There are persistent small bilateral pleural effusions, enlargement of cardiac silhouette, and low-grade pulmonary interstitial edema.   Electronically Signed   By: David  Swaziland M.D.   On: 07/05/2015 07:41   Dg Chest 2 View  07/03/2015   CLINICAL DATA:  Acute onset of shortness of breath. Initial encounter.  EXAM: CHEST  2 VIEW  COMPARISON:  Chest radiograph performed 06/14/2015  FINDINGS: The lungs are well-aerated. Small bilateral pleural effusions are seen, with bibasilar airspace opacification, likely reflecting  pulmonary edema. No pneumothorax is seen.  The heart is enlarged. A pacemaker is noted at the left chest wall, with leads ending at the right atrium and right ventricle. No acute osseous abnormalities are seen.  IMPRESSION: Small bilateral pleural effusions, with bibasilar airspace opacification, likely reflecting pulmonary edema. Underlying cardiomegaly noted.   Electronically Signed  By: Roanna Raider M.D.   On: 07/03/2015 19:33   Dg Chest 2 View  06/14/2015   CLINICAL DATA:  Post pacemaker placement yesterday, dyspnea  EXAM: CHEST  2 VIEW  COMPARISON:  06/12/2015  FINDINGS: Cardiomegaly is noted. Central mild vascular congestion without convincing pulmonary edema. Small bilateral pleural effusion with basilar atelectasis. No segmental infiltrate. There is 3 leads cardiac pacemaker with left subclavian approach. There is 1 lead in right atrium and 2 ventricular leads. No pneumothorax.  IMPRESSION: Central mild vascular congestion without convincing pulmonary edema. Small bilateral pleural effusion with basilar atelectasis. No segmental infiltrate. There is 3 leads cardiac pacemaker with left subclavian approach. There is 1 lead in right atrium and 2 ventricular leads. No pneumothorax.   Electronically Signed   By: Natasha Mead M.D.   On: 06/14/2015 08:59   Dg Chest 2 View  06/12/2015   CLINICAL DATA:  Dyspnea.  EXAM: CHEST  2 VIEW  COMPARISON:  1 day prior  FINDINGS: Dual lead pacer. External pacer/ defibrillator. Extensive artifact, primarily over the left side of the chest. Midline trachea. Cardiomegaly accentuated by AP portable technique. Small right pleural effusion is similar. No pneumothorax. Low lung volumes. Pulmonary venous congestion is improved to resolved. Improved right base aeration.  IMPRESSION: Overall improved aeration with decreased to resolved pulmonary venous congestion.  Small right pleural effusion remains with improved adjacent airspace disease.  Extensive artifact degradation,  primarily over the left hemi thorax.   Electronically Signed   By: Jeronimo Greaves M.D.   On: 06/12/2015 07:42   Dg Chest Port 1 View  06/11/2015   CLINICAL DATA:  Shortness of breath and cough for 2 days  EXAM: PORTABLE CHEST - 1 VIEW  COMPARISON:  05/12/2015  FINDINGS: Cardiac shadow remains enlarged. Pacing device is again seen. The left lung is well aerated. Persistent right-sided effusion and basilar atelectasis is seen. Mild vascular congestion remains. No bony abnormality is seen.  IMPRESSION: Persistent right basilar changes and vascular congestion.   Electronically Signed   By: Alcide Clever M.D.   On: 06/11/2015 07:36    Bilateral Lower Extremity Venous Duplex Evaluation:  - No evidence of deep vein thrombosis involving the visualized veins of the right lower extremity. - No evidence of deep vein thrombosis involving the visualized veins of the left lower extremity. - Unable to visualize the peroneal veins bilaterally well enough to fully evaluate. - No evidence of superficial thrombosis of the right or left lower extremity. - The greater saphenous vein had be used for arterial bypasses in both lower extremities and appeared patent. - No evidence of Baker&'s cyst on the right or left.   Admission HPI: Ms. Sonnen is a 80 yo female with HFpEF (EF 40-45% in May 2016), pulmonary hypertension, complete heart block with dual chamber pacemaker s/p lead revision, peripheral vascular disease s/p b/l bypass grafts (Left: Feb 2015; Right: Aug 2015), CKD stage V, HTN, CAD, DMII, and normocytic anemia. She presented to the ED complaining of worsening SOB, leg swelling (R>L), and weight gain that began yesterday. She was recently discharged from [facility] on 6/15 for heart failure exacerbation 2/2 loss of capture of RV pacemaker lead. She was discharged home with instructions to increase Lasix dose from 40 mg PO daily to 40 mg PO BID. However, patient and her daughter report taking the  Lasix only once daily. She is unable to lie flat. She endorses the possibility of diet noncompliance, as she has been eating food given  by her neighbors that may have had too much salt. She denies excessive volume intake. She denies chest pain or palpitations. She was satting 100% on RA.  She followed-up with Cardiology as an outpatient, and reports that the pacemaker is working properly. Her last echo was 05/2015, showing EF 40-45%, moderate mitral regurgitation, and pulmonary hypertension. Echo from 01/2014 shows grade 1 LV diastolic dysfunction.  She and her daughter are about to begin talks with Hospice regarding goals of care. The daughter has said that the patient would like CPR/intubation if for a reversible cause. However, she does not want to go on dialysis.  Hospital Course by problem list:   1. Acute on chronic combined systolic and diastolic heart failure: Patient had all the cardinal symptoms of CHF exacerbation in the setting of medication and dietary noncompliance. She was admitted for diuresis to alleviate pulmonary congestion and leg swelling. Worsening shortness of breath and asymmetric leg swelling is concerning for DVT (Wells DVT: 2-3 high).Dopplers were negative for DVT. Pulmonary embolism less likely, given that she is not hypoxic or tachycardic Anner Crete PE: 3 Moderate; Modified [place]: 1 Low). She was placed on Lasix 80mg  IV BID and continued on home Carvedilol 12.5 mg PO BID, Atorvastatin 40 mg daily, ASA 81 mg daily, Nitrooglycerin 0.4 mg SL PRN chest pain. Her weight came down to 176 prior to discharge. Her home hydralazine 10mg  BID and Imdur 30mg  daily were restarted. She was restarted on her home regimen of Lasix 40mg  PO BID. She was ambulated with pulse ox: patient Saturations on room air at rest = 94%, while ambulating = 88%, saturations on 2 liters of oxygen while ambulating =100 %. She was provided with an oxygen tank for home. At discharge, she reported improvement in  her symptoms. She will follow up with her primary care provider and cardiology.  2. DMII: Her blood glucoses remained between 76 and 187 on sliding scale insulin. She likely does not require Januvia to maintain her hgb A1c goal. She was instructed to hold Januvia and follow up with her PCP for further instructions on her diabetes regimen.  3. Stage V chronic kidney disease: The patient is not interested in dialysis. She and her daugther were interested in speaking with the palliative care team to discuss options of hospice care. She was seen in consultation by palliative care. She will go home with hospice.  Discharge Vitals:   BP 132/50 mmHg  Pulse 71  Temp(Src) 98.6 F (37 C) (Oral)  Resp 20  Ht 4\' 11"  (1.499 m)  Wt 176 lb (79.833 kg)  BMI 35.53 kg/m2  SpO2 97%  Discharge Labs:  Results for orders placed or performed during the hospital encounter of 07/03/15 (from the past 24 hour(s))  Glucose, capillary     Status: Abnormal   Collection Time: 07/04/15  4:28 PM  Result Value Ref Range   Glucose-Capillary 113 (H) 65 - 99 mg/dL   Comment 1 Notify RN   Glucose, capillary     Status: None   Collection Time: 07/04/15 10:06 PM  Result Value Ref Range   Glucose-Capillary 88 65 - 99 mg/dL  Basic metabolic panel     Status: Abnormal   Collection Time: 07/05/15  4:37 AM  Result Value Ref Range   Sodium 139 135 - 145 mmol/L   Potassium 4.1 3.5 - 5.1 mmol/L   Chloride 102 101 - 111 mmol/L   CO2 23 22 - 32 mmol/L   Glucose, Bld 76 65 - 99 mg/dL  BUN 60 (H) 6 - 20 mg/dL   Creatinine, Ser 1.613.39 (H) 0.44 - 1.00 mg/dL   Calcium 8.8 (L) 8.9 - 10.3 mg/dL   GFR calc non Af Amer 11 (L) >60 mL/min   GFR calc Af Amer 13 (L) >60 mL/min   Anion gap 14 5 - 15  CBC     Status: Abnormal   Collection Time: 07/05/15  4:37 AM  Result Value Ref Range   WBC 6.8 4.0 - 10.5 K/uL   RBC 2.92 (L) 3.87 - 5.11 MIL/uL   Hemoglobin 8.7 (L) 12.0 - 15.0 g/dL   HCT 09.627.7 (L) 04.536.0 - 40.946.0 %   MCV 94.9 78.0 -  100.0 fL   MCH 29.8 26.0 - 34.0 pg   MCHC 31.4 30.0 - 36.0 g/dL   RDW 81.114.6 91.411.5 - 78.215.5 %   Platelets 168 150 - 400 K/uL  Glucose, capillary     Status: None   Collection Time: 07/05/15  6:04 AM  Result Value Ref Range   Glucose-Capillary 81 65 - 99 mg/dL  Glucose, capillary     Status: Abnormal   Collection Time: 07/05/15 11:06 AM  Result Value Ref Range   Glucose-Capillary 102 (H) 65 - 99 mg/dL    Signed: Lora PaulaJennifer T Sherina Stammer, MD 07/05/2015, 4:04 PM    Services Ordered on Discharge: None Equipment Ordered on Discharge: None

## 2015-07-05 NOTE — Progress Notes (Signed)
Subjective: No acute events overnight. She is doing well this morning. Reports dyspnea improved. Denies chest pain. No new complaints.  Objective: Vital signs in last 24 hours: Filed Vitals:   07/04/15 1420 07/04/15 2101 07/05/15 0125 07/05/15 0552  BP: 140/64 139/50 132/54 127/49  Pulse: 59 63 61 58  Temp: 98.3 F (36.8 C) 98.3 F (36.8 C) 98.4 F (36.9 C) 98.2 F (36.8 C)  TempSrc: Oral Oral Oral Oral  Resp: 18 20 18 18   Height:      Weight:    176 lb (79.833 kg)  SpO2: 100% 100% 99% 99%   Weight change: -7 lb 8 oz (-3.402 kg)  Intake/Output Summary (Last 24 hours) at 07/05/15 0847 Last data filed at 07/05/15 0827  Gross per 24 hour  Intake   1180 ml  Output   2000 ml  Net   -820 ml   General Apperance: NAD HEENT: Normocephalic, atraumatic, PERRL, EOMI, anicteric sclera Neck: Supple, trachea midline Lungs: Clear to auscultation bilaterally. No wheezes, rhonchi or rales. Breathing comfortably Heart: Regular rate and rhythm, no murmur/rub/gallop Abdomen: Soft, nontender, nondistended, no rebound/guarding Extremities: Normal, atraumatic, warm and well perfused, 2+ pitting edema Skin: No rashes or lesions Neurologic: Alert and interactive. No gross deficits.  Lab Results: Basic Metabolic Panel:  Recent Labs Lab 07/04/15 0500 07/05/15 0437  NA 138 139  K 4.1 4.1  CL 102 102  CO2 25 23  GLUCOSE 155* 76  BUN 54* 60*  CREATININE 3.61* 3.39*  CALCIUM 9.1 8.8*   Liver Function Tests:  Recent Labs Lab 07/04/15 0500  AST 38  ALT 43  ALKPHOS 65  BILITOT 0.7  PROT 6.7  ALBUMIN 3.5   CBC:  Recent Labs Lab 07/04/15 0500 07/05/15 0437  WBC 7.0 6.8  HGB 9.1* 8.7*  HCT 29.3* 27.7*  MCV 94.5 94.9  PLT 180 168   Cardiac Enzymes: No results for input(s): CKTOTAL, CKMB, CKMBINDEX, TROPONINI in the last 168 hours.  CBG:  Recent Labs Lab 07/04/15 0048 07/04/15 0628 07/04/15 1143 07/04/15 1628 07/04/15 2206 07/05/15 0604  GLUCAP 132* 121* 101*  113* 88 81   Studies/Results: Dg Chest 2 View  07/05/2015   CLINICAL DATA:  Shortness of breath, CHF and pleural effusions.  EXAM: CHEST  2 VIEW  COMPARISON:  PA and lateral chest of July 03, 2015  FINDINGS: The lungs are slightly better inflated. There remain small bilateral pleural effusions. Patchy increased density in the retrocardiac region persists consistent with subsegmental atelectasis. The cardiac silhouette remains enlarged. The pulmonary vascularity remains engorged. The permanent pacemaker is in reasonable position radiographically.  IMPRESSION: Slight improvement in CHF. There are persistent small bilateral pleural effusions, enlargement of cardiac silhouette, and low-grade pulmonary interstitial edema.   Electronically Signed   By: David  SwazilandJordan M.D.   On: 07/05/2015 07:41   Dg Chest 2 View  07/03/2015   CLINICAL DATA:  Acute onset of shortness of breath. Initial encounter.  EXAM: CHEST  2 VIEW  COMPARISON:  Chest radiograph performed 06/14/2015  FINDINGS: The lungs are well-aerated. Small bilateral pleural effusions are seen, with bibasilar airspace opacification, likely reflecting pulmonary edema. No pneumothorax is seen.  The heart is enlarged. A pacemaker is noted at the left chest wall, with leads ending at the right atrium and right ventricle. No acute osseous abnormalities are seen.  IMPRESSION: Small bilateral pleural effusions, with bibasilar airspace opacification, likely reflecting pulmonary edema. Underlying cardiomegaly noted.   Electronically Signed   By: Roanna RaiderJeffery  Chang  M.D.   On: 07/03/2015 19:33   Medications: I have reviewed the patient's current medications. Scheduled Meds: . aspirin  81 mg Oral Daily  . atorvastatin  40 mg Oral q1800  . carvedilol  12.5 mg Oral BID WC  . docusate sodium  100 mg Oral BID  . furosemide  40 mg Oral Daily  . heparin  5,000 Units Subcutaneous 3 times per day  . insulin aspart  0-5 Units Subcutaneous QHS  . insulin aspart  0-9 Units  Subcutaneous TID WC  . isosorbide mononitrate  30 mg Oral Daily  . multivitamin  1 tablet Oral QHS  . paricalcitol  1 mcg Oral Daily   Continuous Infusions:  PRN Meds:.albuterol, loratadine, nitroGLYCERIN Assessment/Plan:  Acute on chronic HFpEF exacerbation: Likely 2/2 medication and dietary noncompliance. Dry weight around 176lb. Her weight this morning is 176. -1.3L total i/o. - Discontinue Lasix  IV BID. Start Lasix  PO BID - Continue Carvedilol 12.5 mg PO BID - Continue Atorvastatin 40 mg daily - Continue ASA 81 mg daily - Nitroglycerin 0.4 mg SL PRN chest pain - Daily weights - Strict I/Os  HTN: BP stable. - Restart Hydralazine 10 mg BID - Continue Imdur 30 mg daily  DMII: Last A1c 5.8 (05/2015) - Hold home Lispro and Januvia - SSI  Complete heart block with pacemaker: Continue to monitor  Asthma: Albuterol neb q6hours PRN  Vitamin D Def: continue Vit D supplementation  Chronic Anemia from CKD: stable  CAD: Continue ASA and beta blocker  FEN: Renal  VTE Ppx: Heparin  Dispo: Likely d/c home today if continues to do well this afternoon.  The patient does have a current PCP Eliott Nine, MD) and does not need an [facility] hospital follow-up appointment after discharge.  The patient does not know have transportation limitations that hinder transportation to clinic appointments.  .Services Needed at time of discharge: Y = Yes, Blank = No PT:   OT:   RN:   Equipment:   Other:     LOS: 2 days   Lora Paula, MD 07/05/2015, 8:47 AM

## 2015-07-05 NOTE — Progress Notes (Signed)
Patient discharging home with Hospice care- hospice will supply home 02, not [facility]; Abelino DerrickB Na Waldrip RN,BSN,MHA [phone]

## 2015-07-05 NOTE — Progress Notes (Signed)
Patient seen and examined. Case d/w residents in detail. I agree with findings and plan as documented in Dr. Joaquin MusicKrall's note.  Patient feels better. States breathing is back to baseline. Will discontinue IV lasix and start Lasix 40 mg PO BID. Patient was educated about low salt intake and states that daughter enforces compliance. She also states she does not wish to pursue HD as an option for her chronic renal failure. Will await palliative consult to discuss options of hospice care at home. Patient will likely be dc'd home today if she continues to do well

## 2015-07-05 NOTE — Progress Notes (Signed)
Pt d/c home with dauhgter  Harriett SineNancy and for [facility] [place].and on Oxygen 2 liters PRN with ambulation. AVS and discharge instruction with follow up given to Patient and daughter Harriett Sineancy.[place] oxygen safety education given to both pt and daughter.  Daugter demostrated good understanding with teach back method

## 2015-07-05 NOTE — Care Management Note (Signed)
Case Management Note  Patient Details  Name: Dorothy FraiseCorine H Leichter MRN: [id number] Date of Birth: [birth year]  Subjective/Objective:     Admitted with CHF               Action/Plan: Patient lives alone, very supportive daughter Harriett Sineancy and son. Harriett Sineancy pays for private caregiver at home. Home Hospice choice offered, Har[facility]ancy [facility] [place] Referral made as requested. Harriett Sineancy [phone]( [phone]) will be the contact person for Home Hospice, clinical information faxed to [phone]; Margie with [facility] will be in contact with Harriett SineNancy  Expected Discharge Date:   possibly 07/05/2015               Expected Discharge Plan:  Home w Hospice Care  Discharge planning Services  CM Consult    Choice offered to:  Adult Children     HH Agency[facility]  [facility]  Status of Service:  In process, will continue to follow  Medicare Important Message Given:  [facility]  Reola MosherChandler, Nymir Ringler L, RN,BSN,MHA [phone] 07/05/2015, 1:55 PM

## 2015-07-05 NOTE — Discharge Instructions (Signed)
Please take Lasix (furosemide) 1 tablet twice a day. Your diabetes is very well controlled. Stop taking your Januvia (sitagliptin) and follow up with your primary care physician for your diabetes.  Please follow up with your primary care physician tomorrow (07/06/2015) at 1PM. Please follow up with cardiology on 07/12/2015 at [facility].   Heart Failure Heart failure means your heart has trouble pumping blood. This makes it hard for your body to work well. Heart failure is usually a long-term (chronic) condition. You must take good care of yourself and follow your doctor's treatment plan. HOME CARE  Take your heart medicine as told by your doctor.  Do not stop taking medicine unless your doctor tells you to.  Do not skip any dose of medicine.  Refill your medicines before they run out.  Take other medicines only as told by your doctor or pharmacist.  Stay active if told by your doctor. The elderly and people with severe heart failure should talk with a doctor about physical activity.  Eat heart-healthy foods. Choose foods that are without trans fat and are low in saturated fat, cholesterol, and salt (sodium). This includes fresh or frozen fruits and vegetables, fish, lean meats, fat-free or low-fat dairy foods, whole grains, and high-fiber foods. Lentils and dried peas and beans (legumes) are also good choices.  Limit salt if told by your doctor.  Cook in a healthy way. Roast, grill, broil, bake, poach, steam, or stir-fry foods.  Limit fluids as told by your doctor.  Weigh yourself every morning. Do this after you pee (urinate) and before you eat breakfast. Write down your weight to give to your doctor.  Take your blood pressure and write it down if your doctor tells you to.  Ask your doctor how to check your pulse. Check your pulse as told.  Lose weight if told by your doctor.  Stop smoking or chewing tobacco. Do not use gum or patches that help you quit without your doctor's  approval.  Schedule and go to doctor visits as told.  Nonpregnant women should have no more than 1 drink a day. Men should have no more than 2 drinks a day. Talk to your doctor about drinking alcohol.  Stop illegal drug use.  Stay current with shots (immunizations).  Manage your health conditions as told by your doctor.  Learn to manage your stress.  Rest when you are tired.  If it is really hot outside:  Avoid intense activities.  Use air conditioning or fans, or get in a cooler place.  Avoid caffeine and alcohol.  Wear loose-fitting, lightweight, and light-colored clothing.  If it is really cold outside:  Avoid intense activities.  Layer your clothing.  Wear mittens or gloves, a hat, and a scarf when going outside.  Avoid alcohol.  Learn about heart failure and get support as needed.  Get help to maintain or improve your quality of life and your ability to care for yourself as needed. GET HELP IF:   You gain 03 lb/1.4 kg or more in 1 day or 05 lb/2.3 kg in a week.  You are more short of breath than usual.  You cannot do your normal activities.  You tire easily.  You cough more than normal, especially with activity.  You have any or more puffiness (swelling) in areas such as your hands, feet, ankles, or belly (abdomen).  You cannot sleep because it is hard to breathe.  You feel like your heart is beating fast (palpitations).  You get  dizzy or light-headed when you stand up. GET HELP RIGHT AWAY IF:   You have trouble breathing.  There is a change in mental status, such as becoming less alert or not being able to focus.  You have chest pain or discomfort.  You faint. MAKE SURE YOU:   Understand these instructions.  Will watch your condition.  Will get help right away if you are not doing well or get worse. Document Released: 09/25/2008 Document Revised: 05/03/2014 Document Reviewed: 02/02/2013 [facility] Patient Information 2015 [place], [place].  This information is not intended to replace advice given to you by your health care provider. Make sure you discuss any questions you have with your health care provider.

## 2015-07-05 NOTE — Progress Notes (Signed)
SATURATION QUALIFICATIONS: (This note is used to comply with regulatory documentation for home oxygen)  Patient Saturations on Room Air at Rest = 94%  Patient Saturations on Room Air while Ambulating = 88%  Patient Saturations on 2 Liters of oxygen while Ambulating =100 %  Please briefly explain why patient needs home oxygen:

## 2015-07-07 ENCOUNTER — Encounter (HOSPITAL_COMMUNITY): Payer: Medicare Other

## 2015-07-08 ENCOUNTER — Telehealth: Payer: Self-pay | Admitting: Interventional Cardiology

## 2015-07-08 NOTE — Telephone Encounter (Signed)
New Message    Pt's daughter calling wanting to know if Dr. Katrinka BlazingSmith can make a referral for pt to [facility], pt is continuing to retain fluid and is on oxygen more than before and they are unable to manage pt's care at home as well as they were able to before. Please call back and advise.

## 2015-07-08 NOTE — Telephone Encounter (Signed)
Message received   Talked with daughter Harriett Sineancy.  Case Management inpatient referred her to [facility]. Margie with [facility] is to contact her.  Daughter said that Hospice did contact her,  Hospice rep told her since her mother did not have a 24/7 care giver in the home she did not qualify for their [facility](Hospice) services.  The patient has had a part time care giver in the home for 3 nights a week that has been recently admitted to the hospital with an embolism.  Harriett Sineancy would like Dr. Katrinka BlazingSmith to make a referral for  [facility].  Harriett Sineancy is unable to manage patients care at home as well as they were before.  Called Hospice and left VM with Margie to discuss options available for patient and daughter

## 2015-07-10 NOTE — Telephone Encounter (Signed)
Okay to refer to [facility] for long term care/terminal care.

## 2015-07-11 ENCOUNTER — Encounter (HOSPITAL_COMMUNITY): Payer: Medicare Other

## 2015-07-11 ENCOUNTER — Encounter (HOSPITAL_COMMUNITY)
Admission: RE | Admit: 2015-07-11 | Discharge: 2015-07-11 | Disposition: A | Discharge status: Home / Self Care | Payer: Medicare Other | Source: Ambulatory Visit | Attending: Nephrology | Admitting: Nephrology

## 2015-07-11 DIAGNOSIS — N183 Chronic kidney disease, stage 3 (moderate): Secondary | ICD-10-CM | POA: Diagnosis not present

## 2015-07-11 DIAGNOSIS — D631 Anemia in chronic kidney disease: Secondary | ICD-10-CM | POA: Insufficient documentation

## 2015-07-11 MED ORDER — DARBEPOETIN ALFA 40 MCG/0.4ML IJ SOSY
40.0000 ug | PREFILLED_SYRINGE | INTRAMUSCULAR | Status: DC
  Administered 2015-07-11: 40 ug via SUBCUTANEOUS
  Filled 2015-07-11: qty 0.4

## 2015-07-11 NOTE — Progress Notes (Signed)
Cardiology Office Note   Date:  07/12/2015   ID:  Ayo, Guarino [birth year], MRN [id number]   Patient Care Team: Eliott Nine, MD as PCP - General (Family Medicine) Elvis Coil, MD as Consulting Physician (Nephrology) Lyn Records, MD as Consulting Physician (Cardiology) Hillis Range, MD as Consulting Physician (Clinical Cardiac Electrophysiology)    Chief Complaint  Patient presents with  . Congestive Heart Failure     History of Present Illness: Dorothy Bartlett is a 81 y.o. female with a hx of CAD s/p prior MI in 1995 treated with angioplasty of the diagonal, ramus and apical LAD, combined systolic and diastolic CHF, HTN, CKD stage 4, RBBB, T2DM, carotid stenosis, PAD. She underwent L Fem-Tib Bypass in 01/2014.   Admitted 03/2014 with a/c combined systolic and diastolic CHF in the setting of acute on chronic renal failure complicated by a non-STEMI. Medical Rx was recommended for CAD secondary to chronic kidney disease and increased risk of contrast-induced nephropathy. She has refused dialysis in the past.  Admitted 05/2015 with syncope in the setting of complete heart block. She underwent implantation of a dual-chamber pacemaker.  Admitted 6/11-6/15 with acute on chronic combined systolic and diastolic CHF. She was noted to have loss of capture with her pacemaker and underwent RV lead revision. She followed up with Dr. Katrinka Blazing 06/17/15.  Admitted 7/3-7/5 with acute on chronic combined systolic and diastolic CHF. She was not followed by cardiology. She was diuresed with IV Lasix. Discharge weight 176 pounds. Patient was evaluated by palliative care. She was discharged home with hospice care.  She is here with her son.  There was some discussion of admission to [facility].  However, she has decided to stay at home.  She notes her breathing is stable.  She is NYHA 2b-3.  No orthopnea, PND.  LE edema is improved.  No chest pain or syncope.  Weights stable.  PCP mentioned  Metolazone to her as a possible diuretic.     Studies/Reports Reviewed Today:  -  Echo 05/09/15:  Poorly visualized, probable distal septal, distal anterior and distal inferior and apical akinesis, EF 40-45%, aortic sclerosis, moderate MR, mild LAE, PASP 72 mmHg enthesis severe pulmonary hypertension) -  Carotid US 10/14/14: RICA 60-79%, LICA 1-39% >> FU 6 mos - LHC (11/2004): Inferior HK, EF 60%, proximal LAD 50%, ostial D1 80%, mid D1 95%, mid LAD 50-60%, distal LAD at the apex 90%, OM1 occluded (fills late by left to left collaterals), RCA 75%. Medical therapy recommended.  - Myoview (01/29/14): Ant-Lat MI with very small area of peri-infarct ischemia (ant wall), EF 40%; High Risk - findings c/w known disease and Med Rx continued - Echocardiogram (04/2005):EF 55-65%, moderate LVH, mild aortic stenosis (mean gradient 9), moderate LAE.  - Echo (02/11/14): Mild LVH, EF 45-50%, Gr 1 DD, Mean AV gradient 8 mmHg, mild MR, mild LAE, PASP 41 mmHg. - Carotid US (12/2013): < 60% RCA, < 40% LICA.   Past Medical History  Diagnosis Date  . Hyperlipidemia   . Carotid stenosis     Carotid US (12/2013): < 60% RCA, < 40% LICA.  [name] Hypertension   . Coronary artery disease     a. s/p MI 1995 tx w/ PTCA to Dx, Ramus, ap LAD;  b. LHC (11/2004):  Inferior HK, EF 60%, proximal LAD 50%, ostial D1 80%, mid D1 95%, mid LAD 50-60%, distal LAD at the apex 90%, OM1 occluded (fills late by left to left collaterals), RCA 75%. Medical  therapy recommended.;  c. Lexiscan Myoview (12/2013):  Prior ant-lat scar with very small area of peri-infarct ischemia in ant wall, EF 40 (high risk)   . Obesity (BMI 30-39.9)   . Intrinsic asthma 01/28/2008     Mild intermittant asthma, PFT's 01/28/08     . Old anterolateral myocardial infarction   . PVD (peripheral vascular disease) 02/16/2014    2015, right femoral peroneal bypass grafting and left femoral-tibial bypass grafting by Dr. Arbie CookeyEarly for rest pain as well as nonhealing ulcer on  toe accompanied with toe amputation  Arteriogram October 2015 with patent graft on the right, 70% stenosis of graft on the left proximally.   . Chronic combined systolic and diastolic heart failure 04/13/2014  . Complete heart block     a. 05/2015 s/p [facility] [employer] [facility] model [id number] dual chamber PPM (serial number [id number]) (Dr. Johney FrameAllred).  [name]. Hx of echocardiogram     a. Echo 5/16: dist septal, dist ant, dist inf and apical AK, EF 40-45%, mod MR, mild LAE, PASP 72  . Chronic kidney disease (CKD), stage V     notes 07/03/2015; Dr. Hyman HopesWebb  . 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 Type 2 diabetes mellitus with renal manifestations   . Presence of permanent cardiac pacemaker     Past Surgical History  Procedure Laterality Date  . Repair of nerve in left arm  Left 2000  . Cardiac catheterization  1995    w/ PTCA Diag 2nd MI  . Femoral-tibial bypass graft Left 02/24/2014    Procedure: BYPASS GRAFT FEMORAL-TIBIAL ARTERY;  Surgeon: Larina Earthlyodd F Early, MD;  [facility];  Service: Vascular;  Laterality: Left;  . Femoral-popliteal bypass graft Right 08/04/2014    Procedure: RIGHT FEMORAL-PERONEAL ARTERY BYPASS GRAFT;  Surgeon: Larina Earthlyodd F Early, MD;  [facility];  Service: Vascular;  Laterality: Right;  . Amputation Right 08/04/2014    Procedure: AMPUTATION DIGIT-RIGHT 3RD TOE;  Surgeon: Larina Earthlyodd F Early, MD;  [facility];  Service: Vascular;  Laterality: Right;  . Lower extremity angiogram Left 01/08/2014    Procedure: LOWER EXTREMITY ANGIOGRAM;  Surgeon: Sherren Kernsharles E Fields, MD;  [facility];  Service: Cardiovascular;  Laterality: Left;  . Abdominal angiogram  01/08/2014    Procedure: ABDOMINAL ANGIOGRAM;  Surgeon: Sherren Kernsharles E Fields, MD;  [facility];  Service: Cardiovascular;;  . Lower extremity angiogram Right 08/03/2014    Procedure: LOWER EXTREMITY ANGIOGRAM;  Surgeon: Nada LibmanVance W Brabham, MD;  [facility];  Service: Cardiovascular;   Laterality: Right;  . Abdominal aortagram N/A 10/25/2014    Procedure: ABDOMINAL Ronny FlurryAORTAGRAM;  Surgeon: Chuck Hinthristopher S Dickson, MD;  [facility];  Service: Cardiovascular;  Laterality: N/A;  . Ep implantable device N/A 05/10/2015    Procedure: Pacemaker Implant;  Surgeon: Hillis RangeJames Allred, MD;  [facility];  Service: Cardiovascular;  Laterality: N/A;  . Ep implantable device N/A 06/13/2015    Procedure: Lead Revision/Repair;  Surgeon: Duke SalviaSteven C Klein, MD;  [facility];  Service: Cardiovascular;  Laterality: N/A;  . Pacemaker lead removal  06/13/2015    Procedure: Pacemaker Lead Removal;  Surgeon: Duke SalviaSteven C Klein, MD;  [facility];  Service: Cardiovascular;;  . Insert / replace / remove pacemaker       Current Outpatient Prescriptions  Medication Sig Dispense Refill  . albuterol (PROVENTIL HFA;VENTOLIN HFA) 108 (90 BASE) MCG/ACT inhaler Inhale 2 puffs into the lungs  every 6 (six) hours as needed for wheezing or shortness of breath.    [name] aspirin 81 MG tablet Take 81 mg by mouth every morning.     [name] atorvastatin (LIPITOR) 40 MG tablet Take 40 mg by mouth every morning.     . B Complex-C-Folic Acid (RENA-VITE RX) 1 MG TABS Take 1 tablet by mouth every morning.     . carvedilol (COREG) 12.5 MG tablet Take 1 tablet (12.5 mg total) by mouth 2 (two) times daily. 180 tablet 1  . docusate sodium (COLACE) 100 MG capsule Take 100 mg by mouth 2 (two) times daily.     . furosemide (LASIX) 40 MG tablet Take 1 tablet (40 mg total) by mouth 2 (two) times daily. 60 tablet 0  . hydrALAZINE (APRESOLINE) 10 MG tablet Take 1 tablet by mouth 3 (three) times daily.     . insulin lispro (HUMALOG) 100 UNIT/ML injection Inject 5 Units into the skin 2 (two) times daily as needed for high blood sugar. Only takes if CBG >250.    [name] isosorbide mononitrate (IMDUR) 30 MG 24 hr tablet Take 30 mg by mouth daily.    [name] loratadine (CLARITIN) 10 MG tablet Take 10 mg by mouth daily  as needed for allergies.    . Melatonin 3 MG TABS Take 3 mg by mouth at bedtime as needed (for sleep).     . nitroGLYCERIN (NITROSTAT) 0.4 MG SL tablet Place 1 tablet (0.4 mg total) under the tongue every 5 (five) minutes x 3 doses as needed for chest pain. 25 tablet 3  . paricalcitol (ZEMPLAR) 1 MCG capsule Take 1 mcg by mouth daily.     . Vitamin D, Ergocalciferol, (DRISDOL) 50000 UNITS CAPS capsule Take 1 capsule (50,000 Units total) by mouth every 7 (seven) days. 10 capsule 0   No current facility-administered medications for this visit.    Allergies:   Review of patient's allergies indicates no known allergies.    Social History:  The patient  reports that she has never smoked. She has never used smokeless tobacco. She reports that she does not drink alcohol or use illicit drugs.   Family History:  The patient's family history includes Diabetes in her father; Hyperlipidemia in her daughter and son; Hypertension in her daughter, father, and sister.    ROS:   Please see the history of present illness.   Review of Systems  Cardiovascular: Positive for dyspnea on exertion and leg swelling.  All other systems reviewed and are negative.     PHYSICAL EXAM: VS:  BP 142/62 mmHg  Pulse 64  Ht  (1.499 m)  Wt 173 lb 9.6 oz (78.744 kg)  BMI 35.04 kg/m2    Wt Readings from Last 3 Encounters:  07/12/15 173 lb 9.6 oz (78.744 kg)  07/05/15 176 lb (79.833 kg)  06/17/15 176 lb 12.8 oz (80.196 kg)     GEN: Well nourished, well developed, in no acute distress HEENT: normal Neck: no JVD, no masses Cardiac:  Normal S1/S2, RRR; no murmur ,  no rubs or gallops, trace bilateral LE edema R > L  Respiratory:  clear to auscultation bilaterally, no wheezing, rhonchi or rales. GI: soft, nontender, nondistended, + BS MS: no deformity or atrophy Skin: warm and dry  Neuro:  CNs II-XII intact, Strength and sensation are intact Psych: Normal affect   EKG:  EKG is ordered today.  It  demonstrates:   AV Paced HR 64   Recent Labs: 05/08/2015: TSH 2.772  06/12/2015: Magnesium 2.2 07/03/2015: B Natriuretic Peptide 2462.5* 07/04/2015: ALT 43 07/05/2015: BUN 60*; Creatinine, Ser 3.39*; Hemoglobin 8.7*; Platelets 168; Potassium 4.1; Sodium 139    Lipid Panel No results found for: CHOL, TRIG, HDL, CHOLHDL, VLDL, LDLCALC, LDLDIRECT    ASSESSMENT AND PLAN:  1.  Chronic combined systolic and diastolic heart failure:  Volume stable. Weight stable at home. Continue current dose of Lasix. Arrange follow-up BMET.  Consider metolazone when necessary if she has increasing weights or edema at home. 2.  CKD (chronic kidney disease) stage 5, GFR less than 15 ml/min:  Arrange follow-up BMET. Follow-up with nephrology as planned.  She has declined dialysis. She was evaluated by palliative care in the hospital with plans for home Hospice.  3.  CAD S/P PTCA 1995:  No angina. Continue aspirin, statin, beta blocker, nitrates. 4.  Cardiomyopathy-EF 40-45% by echo May 2016:  Continue conservative management. Continue beta blocker, hydralazine, nitrates. 5.  Hypertensive heart disease with heart failure:  Borderline blood pressure control. Continue current therapy. 6.  Hyperlipidemia:  Continue statin. 7.  Complete heart block s/p Pacemaker-St Jude May 2016: Follow-up with EP as planned.     Medication Changes: Current medicines are reviewed at length with the patient today.  Concerns regarding medicines are as outlined above.  The following changes have been made:   Discontinued Medications   No medications on file   Modified Medications   No medications on file   New Prescriptions   No medications on file    Labs/ tests ordered today include:   Orders Placed This Encounter  Procedures  . EKG 12-Lead     Disposition:   FU with Dr. Verdis Prime 08/2015 as planned.    Signed, Brynda Rim, MHS 07/12/2015 4:49 PM    [facility] [address] [place], [place],  [place]  [address] Phone: [phone]; Fax: [phone]

## 2015-07-11 NOTE — Telephone Encounter (Signed)
Daughter Harriett Sineancy and Bunnie DominoSon David plan to futher discuss care for mother before they move forward with the referral to [facility]

## 2015-07-12 ENCOUNTER — Ambulatory Visit (INDEPENDENT_AMBULATORY_CARE_PROVIDER_SITE_OTHER): Payer: Medicare Other | Admitting: Physician Assistant

## 2015-07-12 ENCOUNTER — Encounter: Payer: Self-pay | Admitting: Physician Assistant

## 2015-07-12 ENCOUNTER — Encounter: Payer: Medicare Other | Admitting: Physician Assistant

## 2015-07-12 VITALS — BP 142/62 | HR 64 | Ht 59.0 in | Wt 173.6 lb

## 2015-07-12 DIAGNOSIS — I5042 Chronic combined systolic (congestive) and diastolic (congestive) heart failure: Secondary | ICD-10-CM | POA: Diagnosis not present

## 2015-07-12 DIAGNOSIS — N189 Chronic kidney disease, unspecified: Secondary | ICD-10-CM

## 2015-07-12 DIAGNOSIS — I442 Atrioventricular block, complete: Secondary | ICD-10-CM

## 2015-07-12 DIAGNOSIS — I429 Cardiomyopathy, unspecified: Secondary | ICD-10-CM

## 2015-07-12 DIAGNOSIS — N185 Chronic kidney disease, stage 5: Secondary | ICD-10-CM

## 2015-07-12 DIAGNOSIS — I509 Heart failure, unspecified: Secondary | ICD-10-CM | POA: Diagnosis not present

## 2015-07-12 DIAGNOSIS — Z9861 Coronary angioplasty status: Secondary | ICD-10-CM

## 2015-07-12 DIAGNOSIS — I5043 Acute on chronic combined systolic (congestive) and diastolic (congestive) heart failure: Secondary | ICD-10-CM | POA: Diagnosis not present

## 2015-07-12 DIAGNOSIS — I2510 Atherosclerotic heart disease of native coronary artery without angina pectoris: Secondary | ICD-10-CM

## 2015-07-12 DIAGNOSIS — I110 Hypertensive heart disease with heart failure: Secondary | ICD-10-CM

## 2015-07-12 DIAGNOSIS — Z950 Presence of cardiac pacemaker: Secondary | ICD-10-CM

## 2015-07-12 DIAGNOSIS — E785 Hyperlipidemia, unspecified: Secondary | ICD-10-CM

## 2015-07-12 NOTE — Patient Instructions (Signed)
Medication Instructions:  Your physician recommends that you continue on your current medications as directed. Please refer to the Current Medication list given to you today.   Labwork: YOU HAVE BEEN GIVEN AN RX TO HAVE LAB WORK WITH YOUR NEPHROLOGIST; IF YOUR VISIT IS NOT FOR A COUPLE OF WEEKS THEN PLEASE CALL OUR OFFICE BACK SO THAT WE MAY GET YOUR LAB WORK HERE IN THE OFFICE [phone]  Testing/Procedures: NONE  Follow-Up: KEEP YOUR APPT WITH DR. Katrinka BlazingSMITH 08/02/15  Any Other Special Instructions Will Be Listed Below (If Applicable).

## 2015-07-17 NOTE — ED Provider Notes (Signed)
CSN: [id number]     Arrival date & time 07/03/15  1829 History   First MD Initiated Contact with Patient 07/03/15 1921     Chief Complaint  Patient presents with  . Leg Swelling  . Shortness of Breath      HPI Presents with bilateral leg edema, SOB and orthopnea for one wekk-progressively worsening-p[t reports she can't lie down to not being able to breathe. Sats 97% RA-denies CP hx of CHF-rhales-any exertion causes SOB and fatigue Past Medical History  Diagnosis Date  . Hyperlipidemia   . Carotid stenosis     Carotid US (12/2013): < 60% RCA, < 40% LICA.  [name]. Hypertension   . Coronary artery disease     a. s/p MI 1995 tx w/ PTCA to Dx, Ramus, ap LAD;  b. LHC (11/2004):  Inferior HK, EF 60%, proximal LAD 50%, ostial D1 80%, mid D1 95%, mid LAD 50-60%, distal LAD at the apex 90%, OM1 occluded (fills late by left to left collaterals), RCA 75%. Medical therapy recommended.;  c. Lexiscan Myoview (12/2013):  Prior ant-lat scar with very small area of peri-infarct ischemia in ant wall, EF 40 (high risk)   . Obesity (BMI 30-39.9)   . Intrinsic asthma 01/28/2008     Mild intermittant asthma, PFT's 01/28/08     . Old anterolateral myocardial infarction   . PVD (peripheral vascular disease) 02/16/2014    2015, right femoral peroneal bypass grafting and left femoral-tibial bypass grafting by Dr. Arbie CookeyEarly for rest pain as well as nonhealing ulcer on toe accompanied with toe amputation  Arteriogram October 2015 with patent graft on the right, 70% stenosis of graft on the left proximally.   . Chronic combined systolic and diastolic heart failure 04/13/2014  . Complete heart block     a. 05/2015 [facility] model [id number] dual chamber PPM (serial number [id number]) (Dr. Johney FrameAllred).  [name]. Hx of echocardiogram     a. Echo 5/16: dist septal, dist ant, dist inf and apical AK, EF 40-45%, mod MR, mild LAE, PASP 72  . Chronic kidney disease (CKD), stage V     notes 07/03/2015; Dr. Hyman HopesWebb  . Anemia due to  pre-end-stage renal disease treated with erythropoietin 05/08/2015  . Type 2 diabetes mellitus with renal manifestations   . Presence of permanent cardiac pacemaker    Past Surgical History  Procedure Laterality Date  . Repair of nerve in left arm  Left 2000  . Cardiac catheterization  1995    w/ PTCA Diag 2nd MI  . Femoral-tibial bypass graft Left 02/24/2014    Procedure: BYPASS GRAFT FEMORAL-TIBIAL ARTERY;  Surgeon: Larina Earthlyodd F Early, MD;  [facility];  Service: Vascular;  Laterality: Left;  . Femoral-popliteal bypass graft Right 08/04/2014    Procedure: RIGHT FEMORAL-PERONEAL ARTERY BYPASS GRAFT;  Surgeon: Larina Earthlyodd F Early, MD;  [facility];  Service: Vascular;  Laterality: Right;  . Amputation Right 08/04/2014    Procedure: AMPUTATION DIGIT-RIGHT 3RD TOE;  Surgeon: Larina Earthlyodd F Early, MD;  [facility];  Service: Vascular;  Laterality: Right;  . Lower extremity angiogram Left 01/08/2014    Procedure: LOWER EXTREMITY ANGIOGRAM;  Surgeon: Sherren Kernsharles E Fields, MD;  [facility];  Service: Cardiovascular;  Laterality: Left;  . Abdominal angiogram  01/08/2014    Procedure: ABDOMINAL ANGIOGRAM;  Surgeon: Sherren Kernsharles E Fields, MD;  [facility];  Service: Cardiovascular;;  . Lower extremity angiogram Right 08/03/2014    Procedure: LOWER EXTREMITY ANGIOGRAM;  Surgeon:  Nada Libman, MD;  Location: [facility] CATH LAB;  Service: Cardiovascular;  Laterality: Right;  . Abdominal aortagram N/A 10/25/2014    Procedure: ABDOMINAL Ronny Flurry;  Surgeon: Chuck Hint, MD;  Location: [facility] CATH LAB;  Service: Cardiovascular;  Laterality: N/A;  . Ep implantable device N/A 05/10/2015    Procedure: Pacemaker Implant;  Surgeon: Hillis Range, MD;  Location: [facility] INVASIVE CV LAB;  Service: Cardiovascular;  Laterality: N/A;  . Ep implantable device N/A 06/13/2015    Procedure: Lead Revision/Repair;  Surgeon: Duke Salvia, MD;  Location: [facility] INVASIVE CV LAB;  Service: Cardiovascular;  Laterality: N/A;  . Pacemaker lead  removal  06/13/2015    Procedure: Pacemaker Lead Removal;  Surgeon: Duke Salvia, MD;  Location: [facility] INVASIVE CV LAB;  Service: Cardiovascular;;  . Insert / replace / remove pacemaker     Family History  Problem Relation Age of Onset  . Diabetes Father   . Hypertension Father   . Hypertension Daughter   . Hyperlipidemia Daughter   . Hyperlipidemia Son   . Hypertension Sister    History  Substance Use Topics  . Smoking status: Never Smoker   . Smokeless tobacco: Never Used  . Alcohol Use: No   OB History    No data available     Review of Systems    Allergies  Review of patient's allergies indicates no known allergies.  Home Medications   Prior to Admission medications   Medication Sig Start Date End Date Taking? Authorizing Provider  aspirin 81 MG tablet Take 81 mg by mouth every morning.    Yes Historical Provider, MD  atorvastatin (LIPITOR) 40 MG tablet Take 40 mg by mouth every morning.  01/25/14  Yes Historical Provider, MD  B Complex-C-Folic Acid (RENA-VITE RX) 1 MG TABS Take 1 tablet by mouth every morning.  02/05/14  Yes Historical Provider, MD  carvedilol (COREG) 12.5 MG tablet Take 1 tablet (12.5 mg total) by mouth 2 (two) times daily. 02/04/14  Yes Lyn Records, MD  docusate sodium (COLACE) 100 MG capsule Take 100 mg by mouth 2 (two) times daily.    Yes Historical Provider, MD  hydrALAZINE (APRESOLINE) 10 MG tablet Take 1 tablet by mouth 3 (three) times daily.  05/25/15  Yes Historical Provider, MD  isosorbide mononitrate (IMDUR) 30 MG 24 hr tablet Take 30 mg by mouth daily.   Yes Historical Provider, MD  loratadine (CLARITIN) 10 MG tablet Take 10 mg by mouth daily as needed for allergies.   Yes Historical Provider, MD  paricalcitol (ZEMPLAR) 1 MCG capsule Take 1 mcg by mouth daily.  04/07/14  Yes Historical Provider, MD  Vitamin D, Ergocalciferol, (DRISDOL) 50000 UNITS CAPS capsule Take 1 capsule (50,000 Units total) by mouth every 7 (seven) days. 06/15/15  Yes Harold Barban, MD  albuterol (PROVENTIL HFA;VENTOLIN HFA) 108 (90 BASE) MCG/ACT inhaler Inhale 2 puffs into the lungs every 6 (six) hours as needed for wheezing or shortness of breath.    Historical Provider, MD  furosemide (LASIX) 40 MG tablet Take 1 tablet (40 mg total) by mouth 2 (two) times daily. 07/05/15   Lora Paula, MD  insulin lispro (HUMALOG) 100 UNIT/ML injection Inject 5 Units into the skin 2 (two) times daily as needed for high blood sugar. Only takes if CBG >250.    Historical Provider, MD  Melatonin 3 MG TABS Take 3 mg by mouth at bedtime as needed (for sleep).     Historical Provider, MD  nitroGLYCERIN (  NITROSTAT) 0.4 MG SL tablet Place 1 tablet (0.4 mg total) under the tongue every 5 (five) minutes x 3 doses as needed for chest pain. 06/17/15   Lyn Records, MD   BP 132/50 mmHg  Pulse 71  Temp(Src) 98.6 F (37 C) (Oral)  Resp 20  Ht 4\' 11"  (1.499 m)  Wt 176 lb (79.833 kg)  BMI 35.53 kg/m2  SpO2 97% Physical Exam  Constitutional: She is oriented to person, place, and time. She appears well-developed and well-nourished. No distress.  HENT:  Head: Normocephalic and atraumatic.  Eyes: Pupils are equal, round, and reactive to light.  Neck: Normal range of motion.  Cardiovascular: Normal rate and intact distal pulses.   Pulmonary/Chest: No respiratory distress.  Abdominal: Normal appearance. She exhibits no distension.  Musculoskeletal: Normal range of motion.  Neurological: She is alert and oriented to person, place, and time. No cranial nerve deficit.  Skin: Skin is warm and dry. No rash noted.  Psychiatric: She has a normal mood and affect. Her behavior is normal.  Nursing note and vitals reviewed.   ED Course  Procedures (including critical care time) Medications  furosemide (LASIX) injection 40 mg (40 mg Intravenous Given 07/04/15 0004)  furosemide (LASIX) injection 40 mg (40 mg Intravenous Given 07/04/15 0635)  furosemide (LASIX) injection 80 mg (80 mg Intravenous Given  07/04/15 1036)    Labs Review Labs Reviewed  CBC - Abnormal; Notable for the following:    RBC 3.34 (*)    Hemoglobin 9.9 (*)    HCT 31.4 (*)    All other components within normal limits  BASIC METABOLIC PANEL - Abnormal; Notable for the following:    Glucose, Bld 205 (*)    BUN 57 (*)    Creatinine, Ser 3.61 (*)    GFR calc non Af Amer 10 (*)    GFR calc Af Amer 12 (*)    All other components within normal limits  BRAIN NATRIURETIC PEPTIDE - Abnormal; Notable for the following:    B Natriuretic Peptide 2462.5 (*)    All other components within normal limits  COMPREHENSIVE METABOLIC PANEL - Abnormal; Notable for the following:    Glucose, Bld 155 (*)    BUN 54 (*)    Creatinine, Ser 3.61 (*)    GFR calc non Af Amer 10 (*)    GFR calc Af Amer 12 (*)    All other components within normal limits  CBC - Abnormal; Notable for the following:    RBC 3.10 (*)    Hemoglobin 9.1 (*)    HCT 29.3 (*)    All other components within normal limits  GLUCOSE, CAPILLARY - Abnormal; Notable for the following:    Glucose-Capillary 132 (*)    All other components within normal limits  GLUCOSE, CAPILLARY - Abnormal; Notable for the following:    Glucose-Capillary 121 (*)    All other components within normal limits  GLUCOSE, CAPILLARY - Abnormal; Notable for the following:    Glucose-Capillary 101 (*)    All other components within normal limits  GLUCOSE, CAPILLARY - Abnormal; Notable for the following:    Glucose-Capillary 113 (*)    All other components within normal limits  BASIC METABOLIC PANEL - Abnormal; Notable for the following:    BUN 60 (*)    Creatinine, Ser 3.39 (*)    Calcium 8.8 (*)    GFR calc non Af Amer 11 (*)    GFR calc Af Amer 13 (*)  All other components within normal limits  CBC - Abnormal; Notable for the following:    RBC 2.92 (*)    Hemoglobin 8.7 (*)    HCT 27.7 (*)    All other components within normal limits  GLUCOSE, CAPILLARY - Abnormal; Notable for the  following:    Glucose-Capillary 102 (*)    All other components within normal limits  GLUCOSE, CAPILLARY - Abnormal; Notable for the following:    Glucose-Capillary 187 (*)    All other components within normal limits  GLUCOSE, CAPILLARY  GLUCOSE, CAPILLARY  I-STAT TROPOININ, ED    Imaging Review No results found.   EKG Interpretation   Date/Time:  Sunday July 03 2015 18:50:34 EDT Ventricular Rate:  75 PR Interval:  170 QRS Duration: 188 QT Interval:  478 QTC Calculation: 533 R Axis:   -85 Text Interpretation:  Atrial-sensed ventricular-paced rhythm Abnormal ECG  No significant change since last tracing Confirmed by Shannah Conteh  MD, Donyae Kohn  ([id number]) on 07/03/2015 7:21:58 PM     CRITICAL CARE Performed by: Nelva Nay L Total critical care time: 30 min Critical care time was exclusive of separately billable procedures and treating other patients. Critical care was necessary to treat or prevent imminent or life-threatening deterioration. Critical care was time spent personally by me on the following activities: development of treatment plan with patient and/or surrogate as well as nursing, discussions with consultants, evaluation of patient's response to treatment, examination of patient, obtaining history from patient or surrogate, ordering and performing treatments and interventions, ordering and review of laboratory studies, ordering and review of radiographic studies, pulse oximetry and re-evaluation of patient's condition.  MDM   Final diagnoses:  Leg swelling  SOB (shortness of breath)  Chronic diastolic heart failure  Essential hypertension        Nelva Nay, MD 07/17/15 [phone]

## 2015-07-25 ENCOUNTER — Other Ambulatory Visit (HOSPITAL_COMMUNITY): Payer: Self-pay | Admitting: *Deleted

## 2015-07-26 ENCOUNTER — Encounter (HOSPITAL_COMMUNITY)
Admission: RE | Admit: 2015-07-26 | Discharge: 2015-07-26 | Disposition: A | Discharge status: Home / Self Care | Payer: Medicare Other | Source: Ambulatory Visit | Attending: Nephrology | Admitting: Nephrology

## 2015-07-26 ENCOUNTER — Encounter (HOSPITAL_COMMUNITY): Payer: Medicare Other

## 2015-07-26 DIAGNOSIS — D631 Anemia in chronic kidney disease: Secondary | ICD-10-CM | POA: Diagnosis not present

## 2015-07-26 MED ORDER — DARBEPOETIN ALFA 40 MCG/0.4ML IJ SOSY
40.0000 ug | PREFILLED_SYRINGE | INTRAMUSCULAR | Status: DC
  Administered 2015-07-26: 40 ug via SUBCUTANEOUS

## 2015-07-26 MED ORDER — DARBEPOETIN ALFA 40 MCG/0.4ML IJ SOSY
PREFILLED_SYRINGE | INTRAMUSCULAR | Status: AC
  Filled 2015-07-26: qty 0.4

## 2015-07-27 ENCOUNTER — Encounter: Payer: Medicare Other | Admitting: Internal Medicine

## 2015-07-31 NOTE — Progress Notes (Signed)
Cardiology Office Note   Date:  08/02/2015   ID:  Dorothy Bartlett, DOB 12/03/1926, MRN [id number]  PCP:  Eliott Nine, MD  Cardiologist:  Lesleigh Noe, MD   Chief Complaint  Patient presents with  . Congestive Heart Failure      History of Present Illness: Dorothy Bartlett is a 81 y.o. female who presents for chronic combined systolic and diastolic heart failure, CAD, end stage kidney disease (not committed to dialysis), CAD, and permanent pacemaker for AV block.  1-2 pillow orthopnea. No significant change in lower extremity edema. Referral to hospice did not work out because they cannot afford around-the-clock care. If she gets to the point that she is unable to care for self at home, she will go to [facility] which is already been that of by the family. She denies chest pain. Appetite is been stable.   Past Medical History  Diagnosis Date  . Hyperlipidemia   . Carotid stenosis     Carotid US (12/2013): < 60% RCA, < 40% LICA.  [name] Hypertension   . Coronary artery disease     a. s/p MI 1995 tx w/ PTCA to Dx, Ramus, ap LAD;  b. LHC (11/2004):  Inferior HK, EF 60%, proximal LAD 50%, ostial D1 80%, mid D1 95%, mid LAD 50-60%, distal LAD at the apex 90%, OM1 occluded (fills late by left to left collaterals), RCA 75%. Medical therapy recommended.;  c. Lexiscan Myoview (12/2013):  Prior ant-lat scar with very small area of peri-infarct ischemia in ant wall, EF 40 (high risk)   . Obesity (BMI 30-39.9)   . Intrinsic asthma 01/28/2008     Mild intermittant asthma, PFT's 01/28/08     . Old anterolateral myocardial infarction   . PVD (peripheral vascular disease) 02/16/2014    2015, right femoral peroneal bypass grafting and left femoral-tibial bypass grafting by Dr. Arbie Cookey for rest pain as well as nonhealing ulcer on toe accompanied with toe amputation  Arteriogram October 2015 with patent graft on the right, 70% stenosis of graft on the left proximally.   . Chronic combined  systolic and diastolic heart failure 04/13/2014  . Complete heart block     a. 05/2015 s/p [facility] Assurity DR model [id number] dual chamber PPM (serial number [id number]) (Dr. Johney Frame).  [name] Hx of echocardiogram     a. Echo 5/16: dist septal, dist ant, dist inf and apical AK, EF 40-45%, mod MR, mild LAE, PASP 72  . Chronic kidney disease (CKD), stage V     notes 07/03/2015; Dr. Hyman Hopes  . 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 Type 2 diabetes mellitus with renal manifestations   . Presence of permanent cardiac pacemaker     Past Surgical History  Procedure Laterality Date  . Repair of nerve in left arm  Left 2000  . Cardiac catheterization  1995    w/ PTCA Diag 2nd MI  . Femoral-tibial bypass graft Left 02/24/2014    Procedure: BYPASS GRAFT FEMORAL-TIBIAL ARTERY;  Surgeon: Larina Earthly, MD;  Location: [facility] OR;  Service: Vascular;  Laterality: Left;  . Femoral-popliteal bypass graft Right 08/04/2014    Procedure: RIGHT FEMORAL-PERONEAL ARTERY BYPASS GRAFT;  Surgeon: Larina Earthly, MD;  Location: [facility] OR;  Service: Vascular;  Laterality: Right;  . Amputation Right 08/04/2014    Procedure: AMPUTATION DIGIT-RIGHT 3RD TOE;  Surgeon: Larina Earthly, MD;  Location[facility];  Service: Vascular;  Laterality: Right;  .  Lower extremity angiogram Left 01/08/2014    Procedure: LOWER EXTREMITY ANGIOGRAM;  Surgeon: Sherren Kerns, MD;  Location: [facility] CATH LAB;  Service: Cardiovascular;  Laterality: Left;  . Abdominal angiogram  01/08/2014    Procedure: ABDOMINAL ANGIOGRAM;  Surgeon: Sherren Kerns, MD;  Location: [facility] CATH LAB;  Service: Cardiovascular;;  . Lower extremity angiogram Right 08/03/2014    Procedure: LOWER EXTREMITY ANGIOGRAM;  Surgeon: Nada Libman, MD;  Location: [facility] CATH LAB;  Service: Cardiovascular;  Laterality: Right;  . Abdominal aortagram N/A 10/25/2014    Procedure: ABDOMINAL Ronny Flurry;  Surgeon: Chuck Hint, MD;  Location: [facility] CATH LAB;  Service:  Cardiovascular;  Laterality: N/A;  . Ep implantable device N/A 05/10/2015    Procedure: Pacemaker Implant;  Surgeon: Hillis Range, MD;  Location: [facility] INVASIVE CV LAB;  Service: Cardiovascular;  Laterality: N/A;  . Ep implantable device N/A 06/13/2015    Procedure: Lead Revision/Repair;  Surgeon: Duke Salvia, MD;  Location: [facility] INVASIVE CV LAB;  Service: Cardiovascular;  Laterality: N/A;  . Pacemaker lead removal  06/13/2015    Procedure: Pacemaker Lead Removal;  Surgeon: Duke Salvia, MD;  Location: [facility] INVASIVE CV LAB;  Service: Cardiovascular;;  . Insert / replace / remove pacemaker       Current Outpatient Prescriptions  Medication Sig Dispense Refill  . albuterol (PROVENTIL HFA;VENTOLIN HFA) 108 (90 BASE) MCG/ACT inhaler Inhale 2 puffs into the lungs every 6 (six) hours as needed for wheezing or shortness of breath.    [name] aspirin 81 MG tablet Take 81 mg by mouth every morning.     [name] atorvastatin (LIPITOR) 40 MG tablet Take 40 mg by mouth every morning.     . B Complex-C-Folic Acid (RENA-VITE RX) 1 MG TABS Take 1 tablet by mouth every morning.     . carvedilol (COREG) 12.5 MG tablet Take 1 tablet (12.5 mg total) by mouth 2 (two) times daily. 180 tablet 1  . docusate sodium (COLACE) 100 MG capsule Take 100 mg by mouth 2 (two) times daily.     . furosemide (LASIX) 40 MG tablet Take 1 tablet (40 mg total) by mouth 2 (two) times daily. 60 tablet 0  . hydrALAZINE (APRESOLINE) 10 MG tablet Take 1 tablet by mouth 3 (three) times daily.     . insulin lispro (HUMALOG) 100 UNIT/ML injection Inject 5 Units into the skin 2 (two) times daily as needed for high blood sugar. Only takes if CBG >250.    [name] isosorbide mononitrate (IMDUR) 30 MG 24 hr tablet Take 30 mg by mouth daily.    [name] JANUVIA 25 MG tablet Take 25 mg by mouth daily.    [name] loratadine (CLARITIN) 10 MG tablet Take 10 mg by mouth daily as needed for allergies.    . Melatonin 3 MG TABS Take 3 mg by mouth at bedtime as needed (for sleep).     .  nitroGLYCERIN (NITROSTAT) 0.4 MG SL tablet Place 1 tablet (0.4 mg total) under the tongue every 5 (five) minutes x 3 doses as needed for chest pain. 25 tablet 3  . paricalcitol (ZEMPLAR) 1 MCG capsule Take 1 mcg by mouth daily.     . Vitamin D, Ergocalciferol, (DRISDOL) 50000 UNITS CAPS capsule Take 1 capsule (50,000 Units total) by mouth every 7 (seven) days. 10 capsule 0   No current facility-administered medications for this visit.    Allergies:   Review of patient's allergies indicates no known allergies.    Social History:  The patient  reports that she has never smoked. She has never used smokeless tobacco. She reports that she does not drink alcohol or use illicit drugs.   Family History:  The patient's family history includes Diabetes in her father; Hyperlipidemia in her daughter and son; Hypertension in her daughter, father, and sister.    ROS:  Please see the history of present illness.   Otherwise, review of systems are positive for right greater than left lower extremity swelling.   All other systems are reviewed and negative.    PHYSICAL EXAM: VS:  BP 122/62 mmHg  Pulse 69  Ht 4\' 11"  (1.499 m)  Wt 79.198 kg (174 lb 9.6 oz)  BMI 35.25 kg/m2  SpO2 98% , BMI Body mass index is 35.25 kg/(m^2). GEN: Well nourished, well developed, in no acute distress HEENT: normal Neck: no JVD, carotid bruits, or masses Cardiac: RRR; no murmurs, rubs, or gallops, bilateral lower extremity edema  Respiratory:  clear to auscultation bilaterally, normal work of breathing GI: soft, nontender, nondistended, + BS MS: no deformity or atrophy Skin: warm and dry, no rash Neuro:  Strength and sensation are intact Psych: euthymic mood, full affect   EKG:  EKG is not ordered today.    Recent Labs: 05/08/2015: TSH 2.772 06/12/2015: Magnesium 2.2 07/03/2015: B Natriuretic Peptide 2462.5* 07/04/2015: ALT 43 07/05/2015: BUN 60*; Creatinine, Ser 3.39*; Hemoglobin 8.7*; Platelets 168; Potassium 4.1;  Sodium 139    Lipid Panel No results found for: CHOL, TRIG, HDL, CHOLHDL, VLDL, LDLCALC, LDLDIRECT    Wt Readings from Last 3 Encounters:  08/02/15 79.198 kg (174 lb 9.6 oz)  07/12/15 78.744 kg (173 lb 9.6 oz)  07/05/15 79.833 kg (176 lb)      Other studies Reviewed: Additional studies/ records that were reviewed today include: Recent office notes and hospital visits were reviewed.. Review of the above records demonstrates: Dialysis is not a treatment option at the patient's request. Hospice has been turned down for the time being. We will continue to manage her conservatively with medications. Nephrology is managing diuretic therapy for the time being.   ASSESSMENT AND PLAN:  1. Chronic combined systolic and diastolic heart failure No evidence of pulmonary congestion. Right greater than left lower extremity edema.  2. Essential hypertension Controlled  3. CAD S/P PTCA 1995 No angina or ischemic complaints  4. Hyperlipidemia Not recently evaluated  5. Hypertensive heart disease with heart failure See above  6. Pacemaker-St Jude May 2016 Functioning normally  7. CKD stage 5, GFR less than 15 ml/min The patient has refused initiation of dialysis.    Current medicines are reviewed at length with the patient today.  The patient does not have concerns regarding medicines.  The following changes have been made:  no change  Labs/ tests ordered today include:  No orders of the defined types were placed in this encounter.     Disposition:   FU with HS in 3 months  Signed, Lesleigh Noe, MD  08/02/2015 2:44 PM    [facility] [address] [place], [place], [place]  [address] Phone: [phone]; Fax: [phone]

## 2015-08-02 ENCOUNTER — Ambulatory Visit (INDEPENDENT_AMBULATORY_CARE_PROVIDER_SITE_OTHER): Payer: Medicare Other | Admitting: Interventional Cardiology

## 2015-08-02 ENCOUNTER — Encounter: Payer: Self-pay | Admitting: Interventional Cardiology

## 2015-08-02 VITALS — BP 122/62 | HR 69 | Ht 59.0 in | Wt 174.6 lb

## 2015-08-02 DIAGNOSIS — I2510 Atherosclerotic heart disease of native coronary artery without angina pectoris: Secondary | ICD-10-CM

## 2015-08-02 DIAGNOSIS — I509 Heart failure, unspecified: Secondary | ICD-10-CM

## 2015-08-02 DIAGNOSIS — I10 Essential (primary) hypertension: Secondary | ICD-10-CM

## 2015-08-02 DIAGNOSIS — I5042 Chronic combined systolic (congestive) and diastolic (congestive) heart failure: Secondary | ICD-10-CM | POA: Diagnosis not present

## 2015-08-02 DIAGNOSIS — Z9861 Coronary angioplasty status: Secondary | ICD-10-CM

## 2015-08-02 DIAGNOSIS — N185 Chronic kidney disease, stage 5: Secondary | ICD-10-CM | POA: Diagnosis not present

## 2015-08-02 DIAGNOSIS — I110 Hypertensive heart disease with heart failure: Secondary | ICD-10-CM

## 2015-08-02 DIAGNOSIS — Z950 Presence of cardiac pacemaker: Secondary | ICD-10-CM

## 2015-08-02 DIAGNOSIS — E785 Hyperlipidemia, unspecified: Secondary | ICD-10-CM

## 2015-08-02 NOTE — Patient Instructions (Addendum)
Medication Instructions:  Your physician recommends that you continue on your current medications as directed. Please refer to the Current Medication list given to you today.   Labwork: None   Testing/Procedures: None   Follow-Up: Your physician recommends that you schedule a follow-up appointment in: 3 months   Any Other Special Instructions Will Be Listed Below (If Applicable). Call the office if you are having to use 3 or more pillows to sleep at night

## 2015-08-03 IMAGING — CR DG CHEST 2V
2 series · 2 of 2 positions shown · non-contrast
Comparison: 1 day prior

CLINICAL DATA: Dyspnea.

EXAM:
CHEST  2 VIEW

[chest lat]
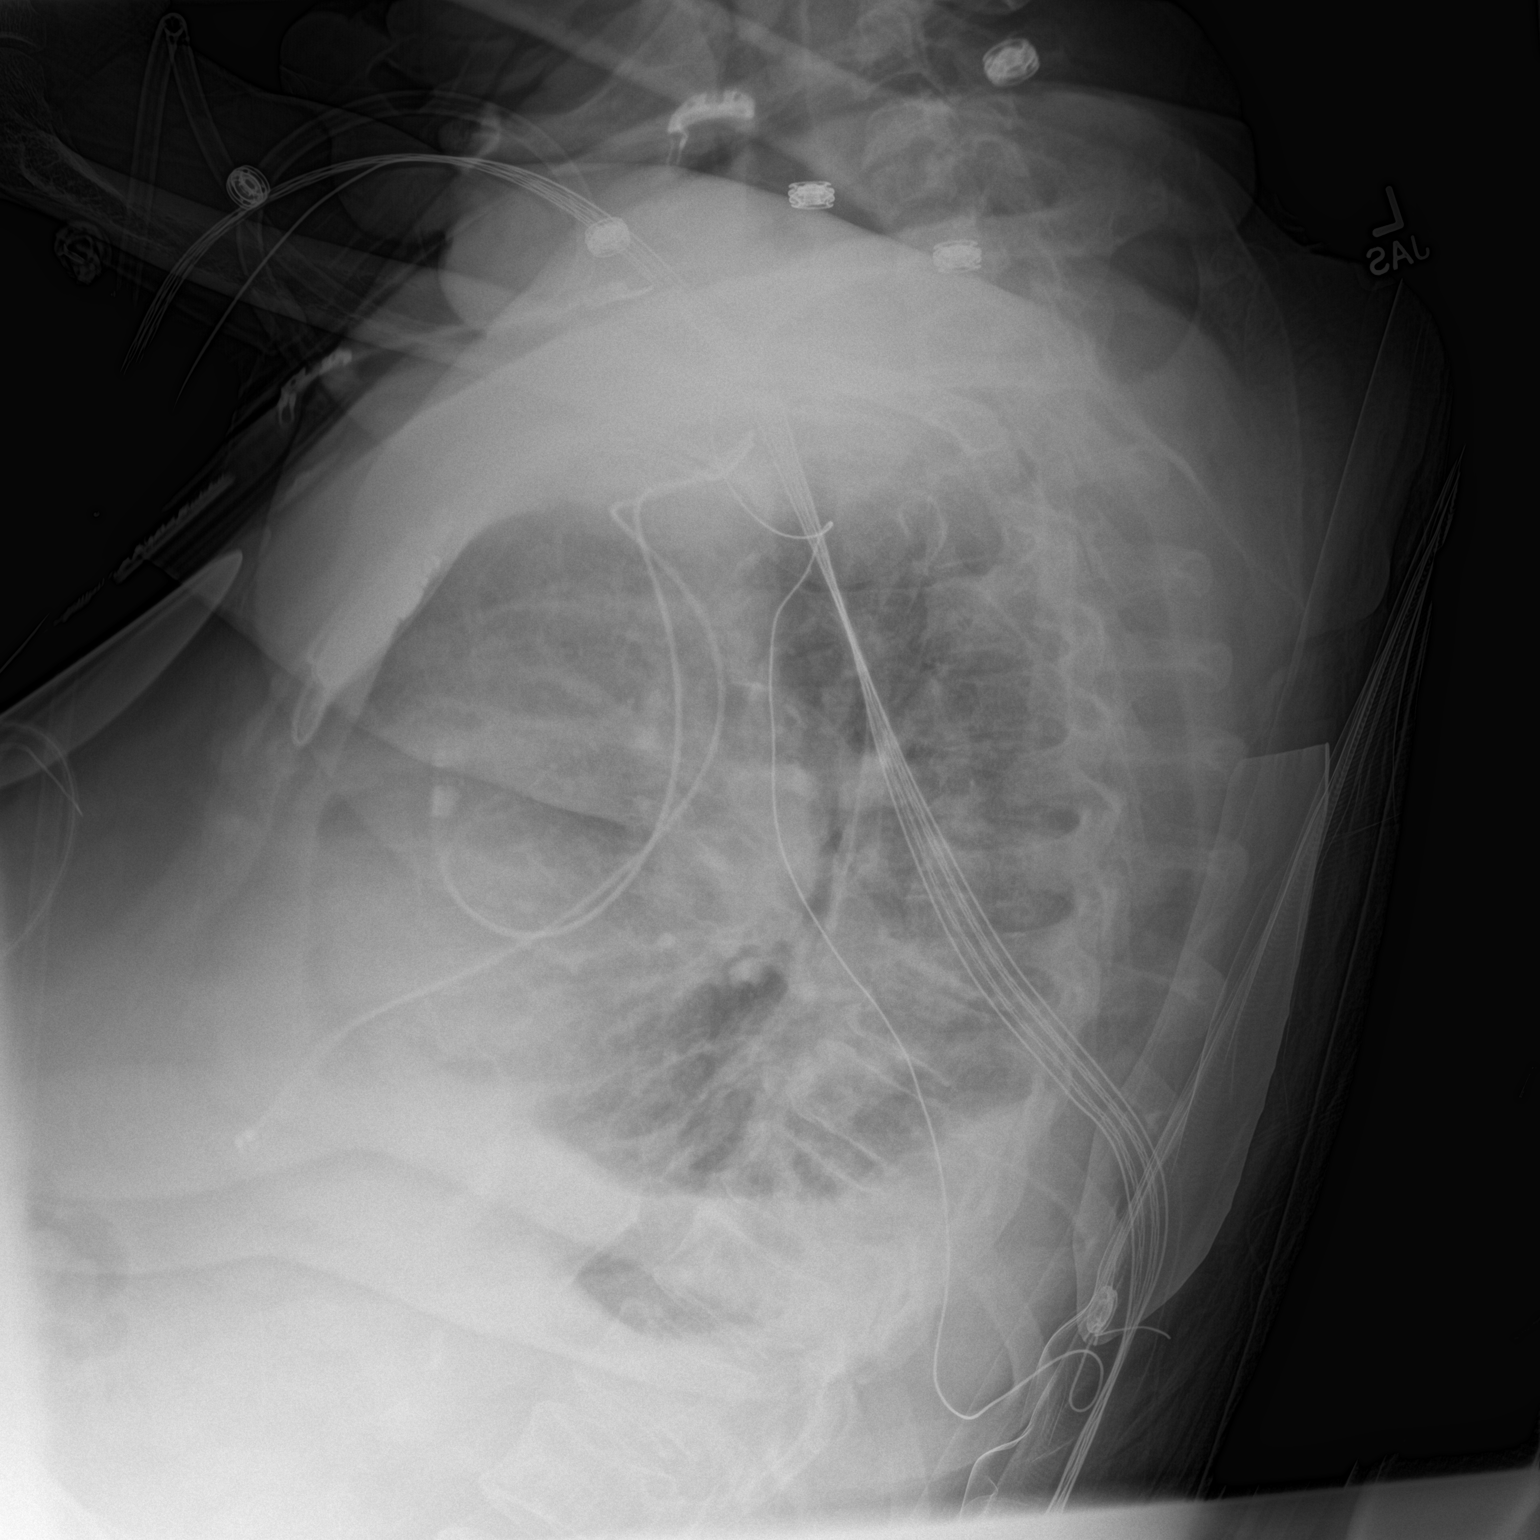

[chest ap]
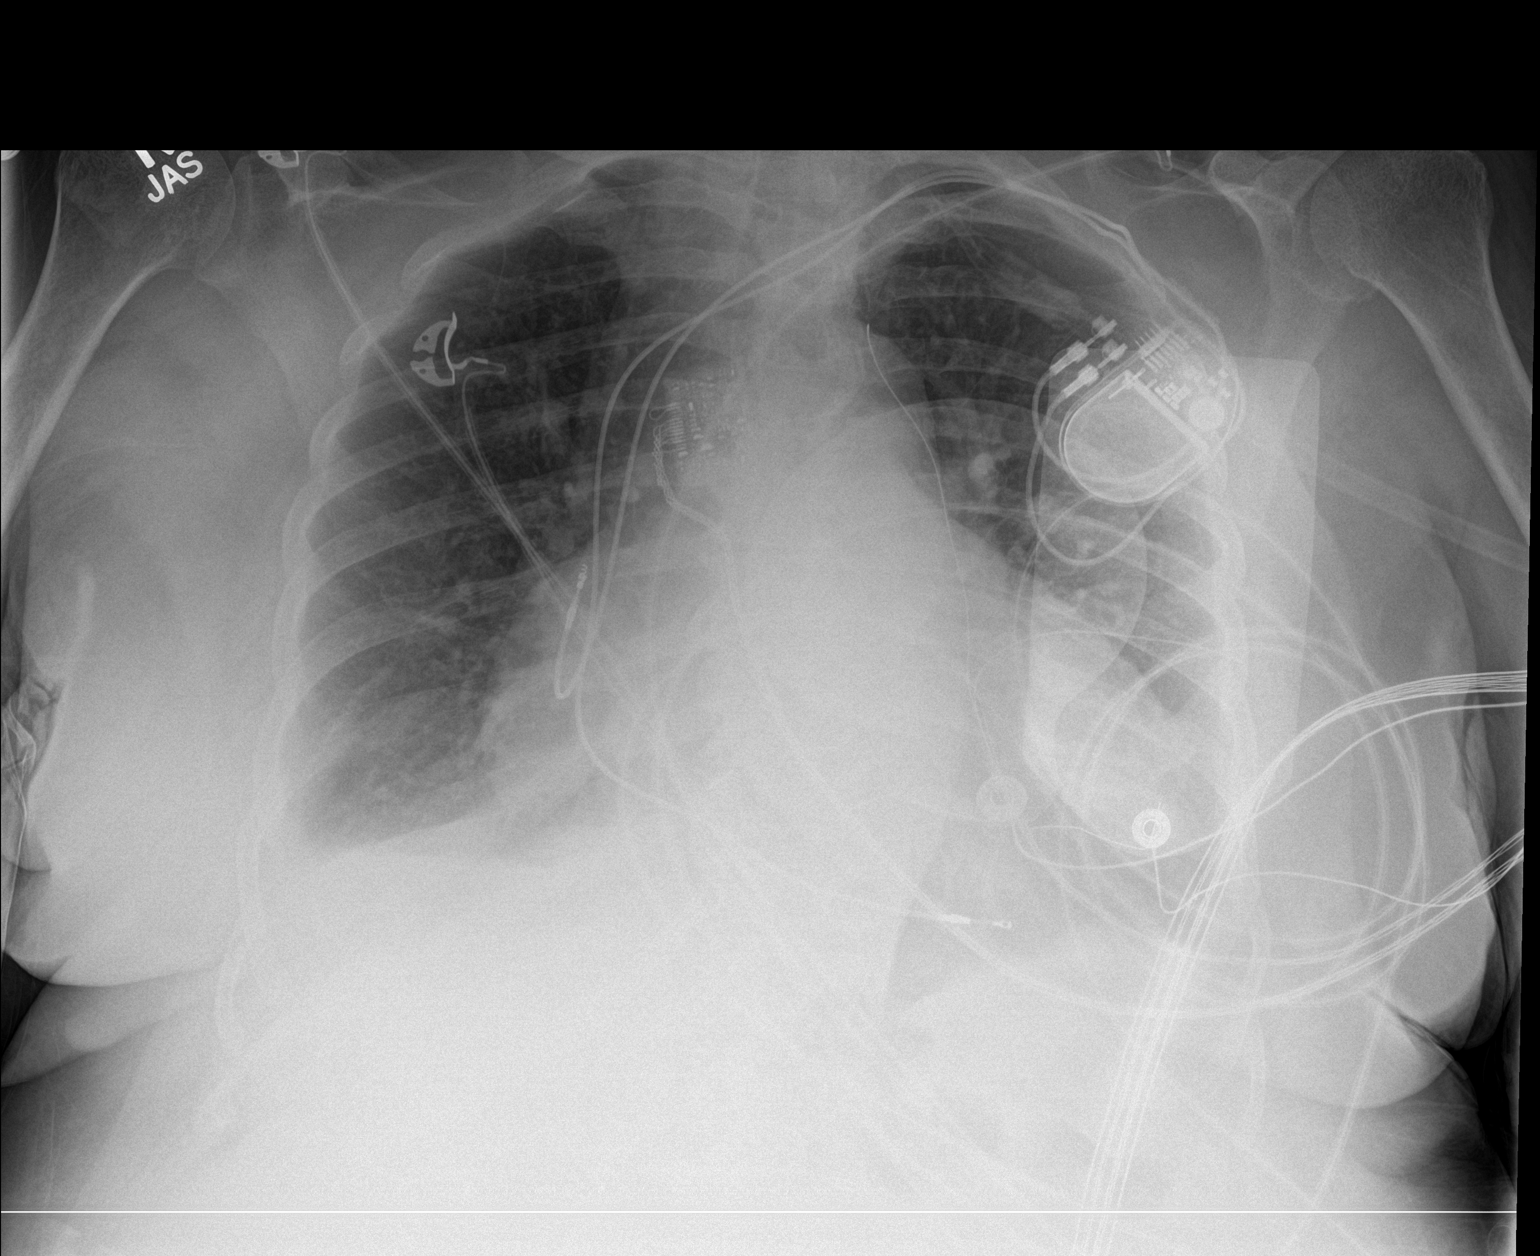

[2 of 2 positions shown; findings below may reference images not displayed]

FINDINGS: Dual lead pacer. External pacer/ defibrillator. Extensive artifact,
primarily over the left side of the chest. Midline trachea.
Cardiomegaly accentuated by AP portable technique. Small right
pleural effusion is similar. No pneumothorax. Low lung volumes.
Pulmonary venous congestion is improved to resolved. Improved right
base aeration.
IMPRESSION: Overall improved aeration with decreased to resolved pulmonary
venous congestion.

Small right pleural effusion remains with improved adjacent airspace
disease.

Extensive artifact degradation, primarily over the left hemi thorax.

## 2015-08-05 IMAGING — CR DG CHEST 2V
3 series · 3 of 3 positions shown · non-contrast
Comparison: 06/12/2015

CLINICAL DATA: Post pacemaker placement yesterday, dyspnea

EXAM:
CHEST  2 VIEW

[chest lat (1 of 2)]
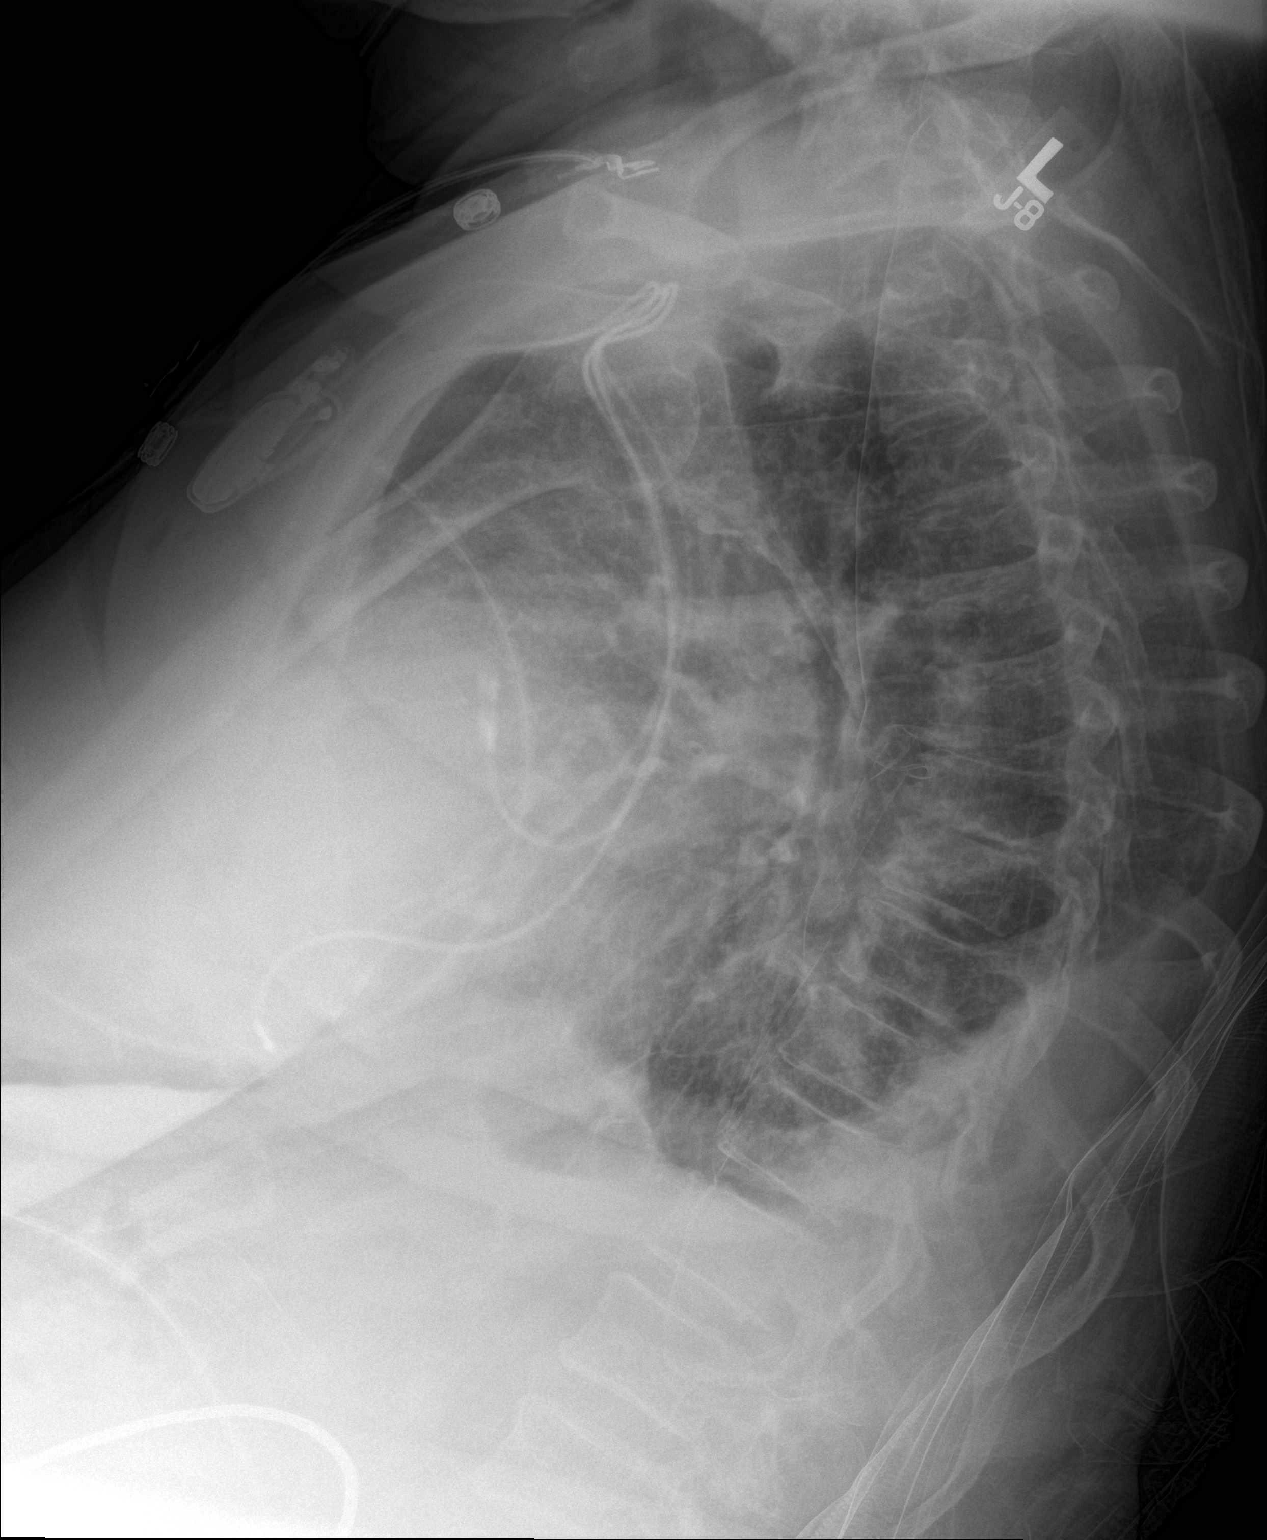

[chest ap]
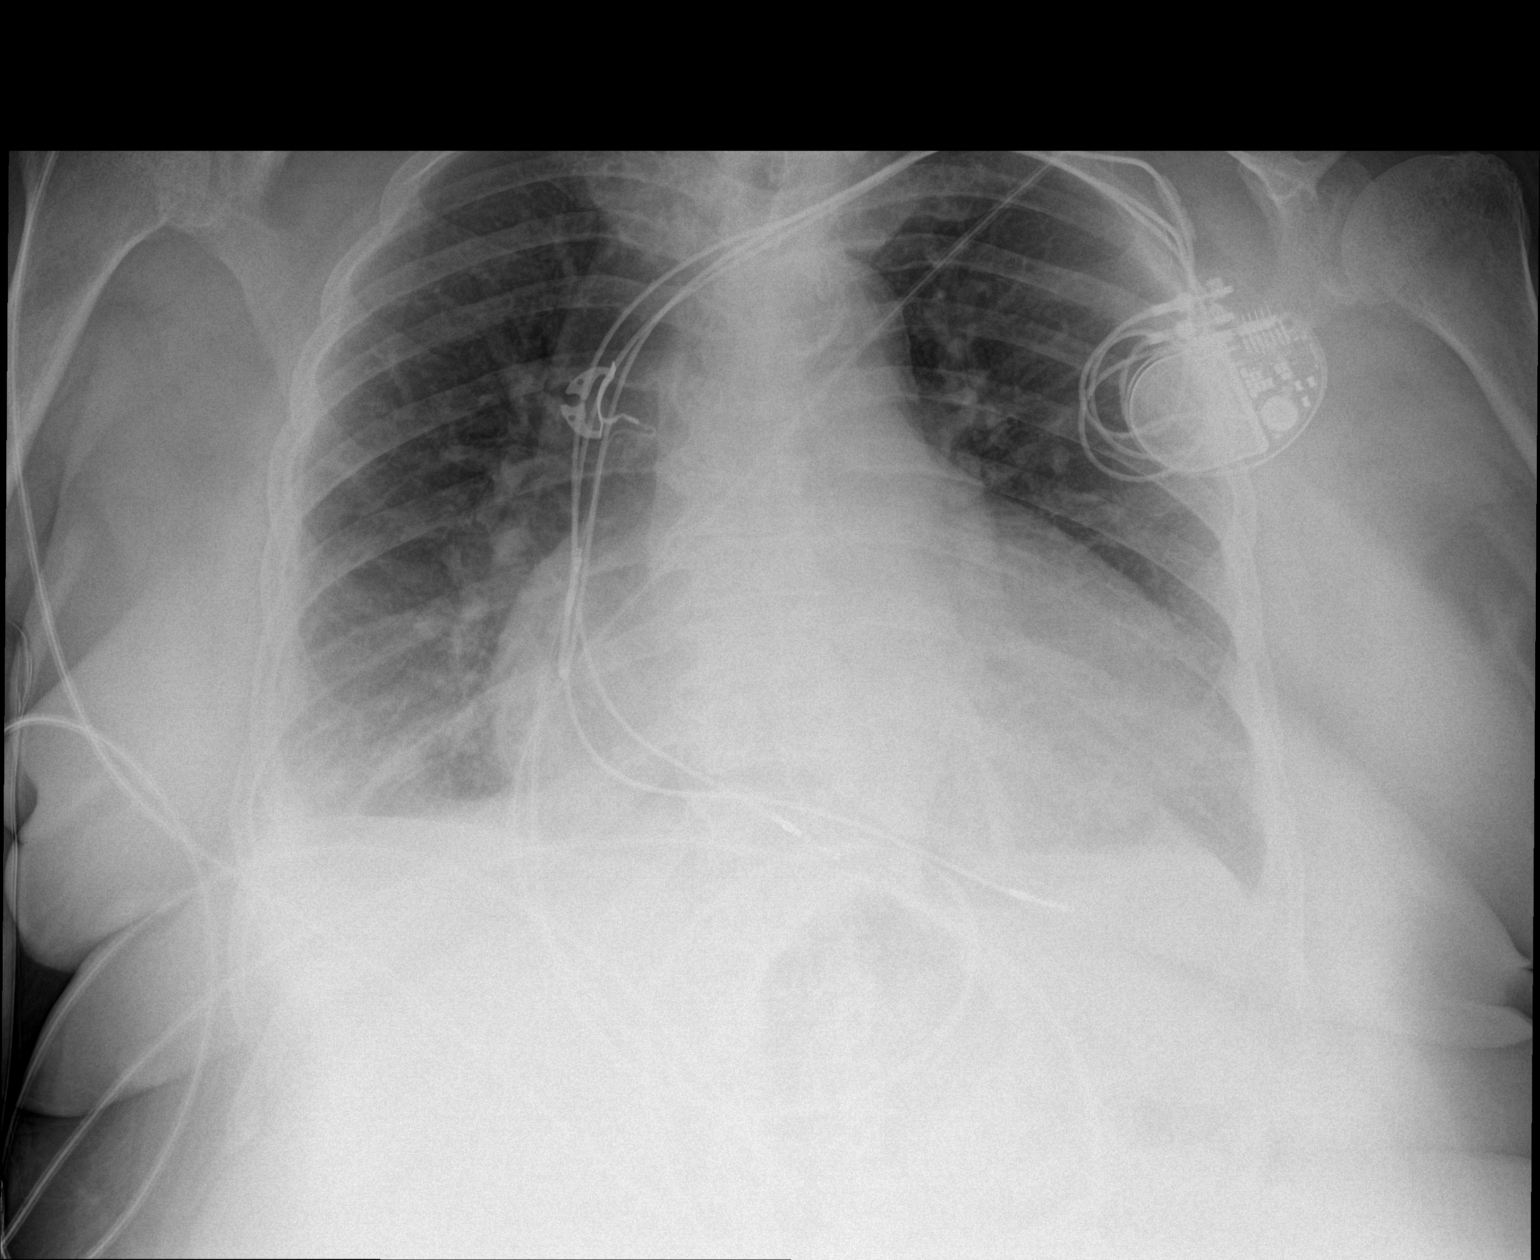

[chest lat (2 of 2)]
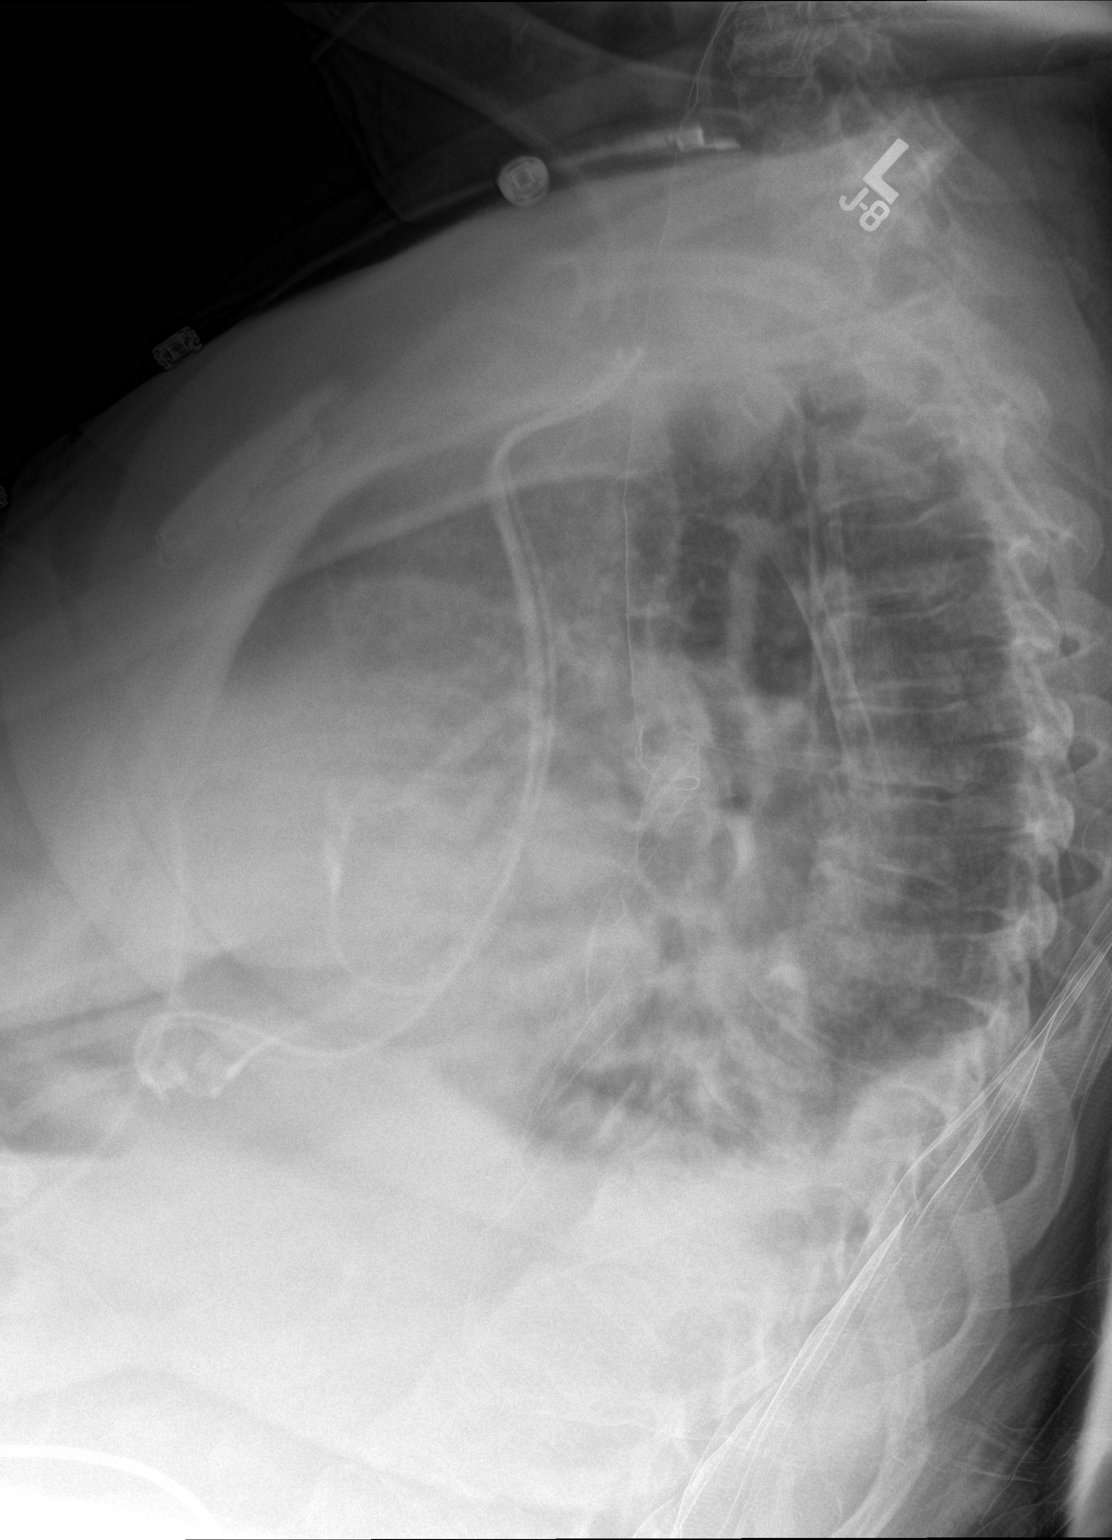

[3 of 3 positions shown; findings below may reference images not displayed]

FINDINGS: Cardiomegaly is noted. Central mild vascular congestion without
convincing pulmonary edema. Small bilateral pleural effusion with
basilar atelectasis. No segmental infiltrate. There is 3 leads
cardiac pacemaker with left subclavian approach. There is 1 lead in
right atrium and 2 ventricular leads. No pneumothorax.
IMPRESSION: Central mild vascular congestion without convincing pulmonary edema.
Small bilateral pleural effusion with basilar atelectasis. No
segmental infiltrate. There is 3 leads cardiac pacemaker with left
subclavian approach. There is 1 lead in right atrium and 2
ventricular leads. No pneumothorax.

## 2015-08-08 ENCOUNTER — Encounter: Payer: Medicare Other | Admitting: Physician Assistant

## 2015-08-09 ENCOUNTER — Encounter (HOSPITAL_COMMUNITY)
Admission: RE | Admit: 2015-08-09 | Discharge: 2015-08-09 | Disposition: A | Discharge status: Home / Self Care | Payer: Medicare Other | Source: Ambulatory Visit | Attending: Nephrology | Admitting: Nephrology

## 2015-08-09 ENCOUNTER — Other Ambulatory Visit: Payer: Self-pay | Admitting: Interventional Cardiology

## 2015-08-09 DIAGNOSIS — D631 Anemia in chronic kidney disease: Secondary | ICD-10-CM | POA: Diagnosis not present

## 2015-08-09 DIAGNOSIS — N183 Chronic kidney disease, stage 3 (moderate): Secondary | ICD-10-CM | POA: Insufficient documentation

## 2015-08-09 DIAGNOSIS — I6529 Occlusion and stenosis of unspecified carotid artery: Secondary | ICD-10-CM

## 2015-08-09 MED ORDER — DARBEPOETIN ALFA 40 MCG/0.4ML IJ SOSY
PREFILLED_SYRINGE | INTRAMUSCULAR | Status: AC
Start: 2015-08-09 — End: 2015-08-09
  Filled 2015-08-09: qty 0.4

## 2015-08-09 MED ORDER — DARBEPOETIN ALFA 40 MCG/0.4ML IJ SOSY
40.0000 ug | PREFILLED_SYRINGE | INTRAMUSCULAR | Status: DC
  Administered 2015-08-09: 40 ug via SUBCUTANEOUS

## 2015-08-15 ENCOUNTER — Encounter (HOSPITAL_COMMUNITY): Payer: Medicare Other

## 2015-08-16 ENCOUNTER — Ambulatory Visit (HOSPITAL_COMMUNITY)
Admission: RE | Admit: 2015-08-16 | Discharge: 2015-08-16 | Disposition: A | Discharge status: Home / Self Care | Payer: Medicare Other | Source: Ambulatory Visit | Attending: Interventional Cardiology | Admitting: Interventional Cardiology

## 2015-08-16 DIAGNOSIS — I6523 Occlusion and stenosis of bilateral carotid arteries: Secondary | ICD-10-CM | POA: Diagnosis not present

## 2015-08-16 DIAGNOSIS — I6529 Occlusion and stenosis of unspecified carotid artery: Secondary | ICD-10-CM | POA: Diagnosis not present

## 2015-08-19 ENCOUNTER — Telehealth: Payer: Self-pay

## 2015-08-19 DIAGNOSIS — I6523 Occlusion and stenosis of bilateral carotid arteries: Secondary | ICD-10-CM

## 2015-08-19 NOTE — Telephone Encounter (Signed)
Error

## 2015-08-19 NOTE — Telephone Encounter (Signed)
Called to give pt carotid results.lmtcb on py daughters phone.

## 2015-08-19 NOTE — Telephone Encounter (Signed)
-----   Message from Henry W Smith, MD sent at 08/17/2015  6:33 PM EDT ----- Carotid study is stable. Consider repeating in one year. 

## 2015-08-19 NOTE — Telephone Encounter (Signed)
-----   Message from Lyn Records, MD sent at 08/17/2015  6:33 PM EDT ----- Carotid study is stable. Consider repeating in one year.

## 2015-08-19 NOTE — Telephone Encounter (Signed)
Pt daughter aware of carotid results. Carotid study is stable. Consider repeating in one year. Pt verbalized understanding.

## 2015-08-22 ENCOUNTER — Encounter (HOSPITAL_COMMUNITY)
Admission: RE | Admit: 2015-08-22 | Discharge: 2015-08-22 | Disposition: A | Discharge status: Home / Self Care | Payer: Medicare Other | Source: Ambulatory Visit | Attending: Nephrology | Admitting: Nephrology

## 2015-08-22 DIAGNOSIS — N183 Chronic kidney disease, stage 3 (moderate): Secondary | ICD-10-CM | POA: Diagnosis not present

## 2015-08-22 MED ORDER — DARBEPOETIN ALFA 40 MCG/0.4ML IJ SOSY
40.0000 ug | PREFILLED_SYRINGE | INTRAMUSCULAR | Status: DC
  Administered 2015-08-22: 40 ug via SUBCUTANEOUS

## 2015-08-22 MED ORDER — DARBEPOETIN ALFA 40 MCG/0.4ML IJ SOSY
PREFILLED_SYRINGE | INTRAMUSCULAR | Status: AC
  Filled 2015-08-22: qty 0.4

## 2015-09-02 ENCOUNTER — Encounter: Payer: Self-pay | Admitting: Podiatry

## 2015-09-02 ENCOUNTER — Ambulatory Visit: Payer: Medicare Other | Admitting: Podiatry

## 2015-09-02 ENCOUNTER — Ambulatory Visit (INDEPENDENT_AMBULATORY_CARE_PROVIDER_SITE_OTHER): Payer: Medicare Other | Admitting: Podiatry

## 2015-09-02 VITALS — BP 140/64 | HR 64

## 2015-09-02 DIAGNOSIS — B351 Tinea unguium: Secondary | ICD-10-CM

## 2015-09-02 DIAGNOSIS — M79606 Pain in leg, unspecified: Secondary | ICD-10-CM | POA: Diagnosis not present

## 2015-09-02 NOTE — Patient Instructions (Signed)
Seen for hypertrophic nails. All nails debrided. Return in 3 months or as needed.  

## 2015-09-02 NOTE — Progress Notes (Signed)
Subjective:  81 year old female presents accompanied by her son, requesting toe nails trimmed. Got a Visual merchandiser in May 10.   Objective: Vascular: Pedal pulses are not palpable. Excess foot and leg edema bilateral R>L. Old surgical incision line on bothsone lower limb. Dermatologic: Discolored and hypertrophic nails x 9. Neurologic: All epicritic sensations grossly intact with hyperalgic foot left.  Orthopedic: Status post removal of 3rd digit right. Severe STJ pronation with flattening arch upon weight bearing.   Assessment:  Status post vascular reconstruction bilateral lower limb.  Pedal edema bilateral. STJ hyper pronation with flattening arch upon weight bearn.  Deformed mycotic nails x 9.  Plan:  All nails debrided. Return in 3 month or as needed.

## 2015-09-06 ENCOUNTER — Other Ambulatory Visit: Payer: Self-pay | Admitting: *Deleted

## 2015-09-06 ENCOUNTER — Encounter (HOSPITAL_COMMUNITY)
Admission: RE | Admit: 2015-09-06 | Discharge: 2015-09-06 | Disposition: A | Discharge status: Home / Self Care | Payer: Medicare Other | Source: Ambulatory Visit | Attending: Nephrology | Admitting: Nephrology

## 2015-09-06 DIAGNOSIS — D631 Anemia in chronic kidney disease: Secondary | ICD-10-CM | POA: Insufficient documentation

## 2015-09-06 DIAGNOSIS — N183 Chronic kidney disease, stage 3 (moderate): Secondary | ICD-10-CM | POA: Diagnosis not present

## 2015-09-06 DIAGNOSIS — I739 Peripheral vascular disease, unspecified: Secondary | ICD-10-CM

## 2015-09-06 DIAGNOSIS — Z48812 Encounter for surgical aftercare following surgery on the circulatory system: Secondary | ICD-10-CM

## 2015-09-06 MED ORDER — DARBEPOETIN ALFA 40 MCG/0.4ML IJ SOSY
40.0000 ug | PREFILLED_SYRINGE | INTRAMUSCULAR | Status: DC
  Administered 2015-09-06: 40 ug via SUBCUTANEOUS
  Filled 2015-09-06: qty 0.4

## 2015-09-13 ENCOUNTER — Encounter: Payer: Medicare Other | Admitting: Internal Medicine

## 2015-09-19 ENCOUNTER — Encounter: Payer: Self-pay | Admitting: Vascular Surgery

## 2015-09-19 ENCOUNTER — Ambulatory Visit (INDEPENDENT_AMBULATORY_CARE_PROVIDER_SITE_OTHER): Payer: Medicare Other | Admitting: Internal Medicine

## 2015-09-19 ENCOUNTER — Encounter (HOSPITAL_COMMUNITY)
Admission: RE | Admit: 2015-09-19 | Discharge: 2015-09-19 | Disposition: A | Discharge status: Home / Self Care | Payer: Medicare Other | Source: Ambulatory Visit | Attending: Nephrology | Admitting: Nephrology

## 2015-09-19 ENCOUNTER — Encounter: Payer: Self-pay | Admitting: Internal Medicine

## 2015-09-19 ENCOUNTER — Encounter: Payer: Medicare Other | Admitting: Internal Medicine

## 2015-09-19 VITALS — BP 136/70 | HR 60 | Ht 59.0 in | Wt 172.0 lb

## 2015-09-19 DIAGNOSIS — I442 Atrioventricular block, complete: Secondary | ICD-10-CM

## 2015-09-19 DIAGNOSIS — D631 Anemia in chronic kidney disease: Secondary | ICD-10-CM | POA: Diagnosis not present

## 2015-09-19 DIAGNOSIS — R55 Syncope and collapse: Secondary | ICD-10-CM

## 2015-09-19 LAB — CUP PACEART INCLINIC DEVICE CHECK
Battery Voltage: 3.01 V
Brady Statistic RA Percent Paced: 28 %
Brady Statistic RV Percent Paced: 99.55 %
Lead Channel Impedance Value: 450 Ohm
Lead Channel Pacing Threshold Amplitude: 0.75 V
Lead Channel Pacing Threshold Pulse Width: 0.4 ms
Lead Channel Sensing Intrinsic Amplitude: 1.5 mV
Lead Channel Setting Pacing Amplitude: 1 V
Lead Channel Setting Pacing Pulse Width: 0.5 ms
MDC IDC MSMT BATTERY REMAINING LONGEVITY: 122.4 mo
MDC IDC MSMT LEADCHNL RV IMPEDANCE VALUE: 537.5 Ohm
MDC IDC MSMT LEADCHNL RV PACING THRESHOLD AMPLITUDE: 1 V
MDC IDC MSMT LEADCHNL RV PACING THRESHOLD PULSEWIDTH: 0.5 ms
MDC IDC PG SERIAL: 7759868
MDC IDC SESS DTM: 20160919163001
MDC IDC SET LEADCHNL RA PACING AMPLITUDE: 1.625
MDC IDC SET LEADCHNL RV SENSING SENSITIVITY: 5 mV
Pulse Gen Model: 2240

## 2015-09-19 MED ORDER — DARBEPOETIN ALFA 40 MCG/0.4ML IJ SOSY
40.0000 ug | PREFILLED_SYRINGE | INTRAMUSCULAR | Status: DC
  Administered 2015-09-19: 40 ug via SUBCUTANEOUS

## 2015-09-19 MED ORDER — DARBEPOETIN ALFA 40 MCG/0.4ML IJ SOSY
PREFILLED_SYRINGE | INTRAMUSCULAR | Status: AC
  Filled 2015-09-19: qty 0.4

## 2015-09-19 NOTE — Patient Instructions (Signed)
Medication Instructions: - no changes  Labwork: - none  Procedures/Testing: - none  Follow-Up: - Remote monitoring is used to monitor your Pacemaker of ICD from home. This monitoring reduces the number of office visits required to check your device to one time per year. It allows [place] to keep an eye on the functioning of your device to ensure it is working properly. You are scheduled for a device check from home on 12/19/15. You may send your transmission at any time that day. If you have a wireless device, the transmission will be sent automatically. After your physician reviews your transmission, you will receive a postcard with your next transmission date.  - Your physician wants you to follow-up in: 9 months with Dr. Graciela Husbands. You will receive a reminder letter in the mail two months in advance. If you don't receive a letter, please call our office to schedule the follow-up appointment.  Any Additional Special Instructions Will Be Listed Below (If Applicable).

## 2015-09-19 NOTE — Progress Notes (Signed)
Patient Care Team: Eliott Nine, MD as PCP - General (Family Medicine) Elvis Coil, MD as Consulting Physician (Nephrology) Lyn Records, MD as Consulting Physician (Cardiology) Hillis Range, MD as Consulting Physician (Clinical Cardiac Electrophysiology)   HPI  Dorothy Bartlett is a 81 y.o. female Seen following pacemaker revision 6/16. She had presented with a 5/16 with complete heart block. Her newly implanted device had loss of RV capture. She underwent lead reimplantation I found myself unable to remove the RV lead somewhat surprisingly. I ended up E.  She has a history of prior MI 1995 treated with PCI. Catheterization 2005 demonstrated inferior wall hypokinesis and ejection fraction of 60% she had modest 3 vessel disease. A Myoview scan undertaken 1/15 was interpreted as high risk with an area of inferolateral scar and peri-infarct ischemia. She has significant renal dysfunction and previously had refused catheterization.  The patient denies chest pain,  nocturnal dyspnea, orthopnea or peripheral edema.  There have been no palpitations, lightheadedness or syncope. She gets around slowly  She's had 2 falls. One she fell out of the chair after leaning to the left. The other she tripped. She denies lightheadedness with turning of her head.   Records and Results Reviewed   Past Medical History  Diagnosis Date  . Hyperlipidemia   . Carotid stenosis     Carotid US (12/2013): < 60% RCA, < 40% LICA.  [name] Hypertension   . Coronary artery disease     a. s/p MI 1995 tx w/ PTCA to Dx, Ramus, ap LAD;  b. LHC (11/2004):  Inferior HK, EF 60%, proximal LAD 50%, ostial D1 80%, mid D1 95%, mid LAD 50-60%, distal LAD at the apex 90%, OM1 occluded (fills late by left to left collaterals), RCA 75%. Medical therapy recommended.;  c. Lexiscan Myoview (12/2013):  Prior ant-lat scar with very small area of peri-infarct ischemia in ant wall, EF 40 (high risk)   . Obesity (BMI 30-39.9)   .  Intrinsic asthma 01/28/2008     Mild intermittant asthma, PFT's 01/28/08     . Old anterolateral myocardial infarction   . PVD (peripheral vascular disease) 02/16/2014    2015, right femoral peroneal bypass grafting and left femoral-tibial bypass grafting by Dr. Arbie Cookey for rest pain as well as nonhealing ulcer on toe accompanied with toe amputation  Arteriogram October 2015 with patent graft on the right, 70% stenosis of graft on the left proximally.   . Chronic combined systolic and diastolic heart failure 04/13/2014  . Complete heart block     a. 05/2015 s/p [facility] Assurity DR model [id number] dual chamber PPM (serial number [id number]) (Dr. Johney Frame).  [name] Hx of echocardiogram     a. Echo 5/16: dist septal, dist ant, dist inf and apical AK, EF 40-45%, mod MR, mild LAE, PASP 72  . Chronic kidney disease (CKD), stage V     notes 07/03/2015; Dr. Hyman Hopes  . 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 Type 2 diabetes mellitus with renal manifestations   . Presence of permanent cardiac pacemaker     Past Surgical History  Procedure Laterality Date  . Repair of nerve in left arm  Left 2000  . Cardiac catheterization  1995    w/ PTCA Diag 2nd MI  . Femoral-tibial bypass graft Left 02/24/2014    Procedure: BYPASS GRAFT FEMORAL-TIBIAL ARTERY;  Surgeon: Larina Earthly, MD;  Location: [facility] OR;  Service: Vascular;  Laterality: Left;  .  Femoral-popliteal bypass graft Right 08/04/2014    Procedure: RIGHT FEMORAL-PERONEAL ARTERY BYPASS GRAFT;  Surgeon: Larina Earthly, MD;  Location: [facility] OR;  Service: Vascular;  Laterality: Right;  . Amputation Right 08/04/2014    Procedure: AMPUTATION DIGIT-RIGHT 3RD TOE;  Surgeon: Larina Earthly, MD;  Location: [facility] OR;  Service: Vascular;  Laterality: Right;  . Lower extremity angiogram Left 01/08/2014    Procedure: LOWER EXTREMITY ANGIOGRAM;  Surgeon: Sherren Kerns, MD;  Location: [facility] CATH LAB;  Service: Cardiovascular;  Laterality: Left;  . Abdominal  angiogram  01/08/2014    Procedure: ABDOMINAL ANGIOGRAM;  Surgeon: Sherren Kerns, MD;  Location: [facility] CATH LAB;  Service: Cardiovascular;;  . Lower extremity angiogram Right 08/03/2014    Procedure: LOWER EXTREMITY ANGIOGRAM;  Surgeon: Nada Libman, MD;  Location: [facility] CATH LAB;  Service: Cardiovascular;  Laterality: Right;  . Abdominal aortagram N/A 10/25/2014    Procedure: ABDOMINAL Ronny Flurry;  Surgeon: Chuck Hint, MD;  Location: [facility] CATH LAB;  Service: Cardiovascular;  Laterality: N/A;  . Ep implantable device N/A 05/10/2015    Procedure: Pacemaker Implant;  Surgeon: Hillis Range, MD;  Location: [facility] INVASIVE CV LAB;  Service: Cardiovascular;  Laterality: N/A;  . Ep implantable device N/A 06/13/2015    Procedure: Lead Revision/Repair;  Surgeon: Duke Salvia, MD;  Location: [facility] INVASIVE CV LAB;  Service: Cardiovascular;  Laterality: N/A;  . Pacemaker lead removal  06/13/2015    Procedure: Pacemaker Lead Removal;  Surgeon: Duke Salvia, MD;  Location: [facility] INVASIVE CV LAB;  Service: Cardiovascular;;  . Insert / replace / remove pacemaker      Current Outpatient Prescriptions  Medication Sig Dispense Refill  . albuterol (PROVENTIL HFA;VENTOLIN HFA) 108 (90 BASE) MCG/ACT inhaler Inhale 2 puffs into the lungs every 6 (six) hours as needed for wheezing or shortness of breath.    [name] aspirin 81 MG tablet Take 81 mg by mouth every morning.     [name] atorvastatin (LIPITOR) 40 MG tablet Take 40 mg by mouth every morning.     . B Complex-C-Folic Acid (RENA-VITE RX) 1 MG TABS Take 1 tablet by mouth every morning.     . carvedilol (COREG) 12.5 MG tablet Take 1 tablet (12.5 mg total) by mouth 2 (two) times daily. 180 tablet 1  . docusate sodium (COLACE) 100 MG capsule Take 100 mg by mouth 2 (two) times daily.     . furosemide (LASIX) 40 MG tablet Take 1 tablet (40 mg total) by mouth 2 (two) times daily. 60 tablet 0  . hydrALAZINE (APRESOLINE) 10 MG tablet Take 1 tablet by mouth 3 (three) times daily.       . insulin lispro (HUMALOG) 100 UNIT/ML injection Inject 5 Units into the skin 2 (two) times daily as needed for high blood sugar. Only takes if CBG >250.    [name] isosorbide mononitrate (IMDUR) 30 MG 24 hr tablet Take 30 mg by mouth daily.    [name] JANUVIA 25 MG tablet Take 25 mg by mouth daily.    [name] loratadine (CLARITIN) 10 MG tablet Take 10 mg by mouth daily as needed for allergies.    . Melatonin 3 MG TABS Take 3 mg by mouth at bedtime as needed (for sleep).     . nitroGLYCERIN (NITROSTAT) 0.4 MG SL tablet Place 1 tablet (0.4 mg total) under the tongue every 5 (five) minutes x 3 doses as needed for chest pain. 25 tablet 3  . paricalcitol (ZEMPLAR) 1 MCG capsule Take 1  mcg by mouth daily.     . Vitamin D, Ergocalciferol, (DRISDOL) 50000 UNITS CAPS capsule Take 1 capsule (50,000 Units total) by mouth every 7 (seven) days. 10 capsule 0   No current facility-administered medications for this visit.   Facility-Administered Medications Ordered in Other Visits  Medication Dose Route Frequency Provider Last Rate Last Dose  . Darbepoetin Alfa (ARANESP) 40 MCG/0.4ML injection           . Darbepoetin Alfa (ARANESP) injection 40 mcg  40 mcg Subcutaneous Q14 Days Elvis Coil, MD   40 mcg at 09/19/15 1242    No Known Allergies    Review of Systems negative except from HPI and PMH  Physical Exam BP 136/70 mmHg  Pulse 60  Ht  (1.499 m)  Wt 172 lb (78.019 kg)  BMI 34.72 kg/m2 Well developed and well nourished in no acute distress sitting in a wheel chair HENT normal E scleral and icterus clear Neck Supple JVP flat; carotids brisk and full Clear to ausculation Device pocket well healed; without hematoma or erythema.  There is no tethering Regular rate and rhythm, no murmurs gallops or rub Soft with active bowel sounds No clubbing cyanosis 1+ R  Edema Alert and oriented, grossly normal motor and sensory function Skin Warm and Dry  ECG was ordered today demonstrated AV pacing   Assessment  and  Plan  Complete heart block  Sinus node dysfunction  Pacemaker-dual-chamber-St. Jude  The patient's device was interrogated and the information was fully reviewed.  The device was reprogrammed to maximize longevity  HFpEF  Hypertension  Coronary artery disease   Without symptoms of ischemia  BP well controlled  aprat from unilateral edema she is euvolemic; continue current meds

## 2015-09-20 ENCOUNTER — Ambulatory Visit (INDEPENDENT_AMBULATORY_CARE_PROVIDER_SITE_OTHER)
Admission: RE | Admit: 2015-09-20 | Discharge: 2015-09-20 | Disposition: A | Discharge status: Home / Self Care | Payer: Medicare Other | Source: Ambulatory Visit | Attending: Vascular Surgery | Admitting: Vascular Surgery

## 2015-09-20 ENCOUNTER — Ambulatory Visit (INDEPENDENT_AMBULATORY_CARE_PROVIDER_SITE_OTHER): Payer: Medicare Other | Admitting: Vascular Surgery

## 2015-09-20 ENCOUNTER — Telehealth: Payer: Self-pay | Admitting: *Deleted

## 2015-09-20 ENCOUNTER — Encounter: Payer: Self-pay | Admitting: Vascular Surgery

## 2015-09-20 ENCOUNTER — Ambulatory Visit (HOSPITAL_COMMUNITY)
Admission: RE | Admit: 2015-09-20 | Discharge: 2015-09-20 | Disposition: A | Discharge status: Home / Self Care | Payer: Medicare Other | Source: Ambulatory Visit | Attending: Vascular Surgery | Admitting: Vascular Surgery

## 2015-09-20 VITALS — BP 159/74 | HR 60 | Temp 98.2°F | Resp 18 | Ht 59.0 in | Wt 171.0 lb

## 2015-09-20 DIAGNOSIS — E785 Hyperlipidemia, unspecified: Secondary | ICD-10-CM | POA: Diagnosis not present

## 2015-09-20 DIAGNOSIS — I739 Peripheral vascular disease, unspecified: Secondary | ICD-10-CM | POA: Diagnosis not present

## 2015-09-20 DIAGNOSIS — E119 Type 2 diabetes mellitus without complications: Secondary | ICD-10-CM | POA: Insufficient documentation

## 2015-09-20 DIAGNOSIS — I70209 Unspecified atherosclerosis of native arteries of extremities, unspecified extremity: Secondary | ICD-10-CM

## 2015-09-20 DIAGNOSIS — Z48812 Encounter for surgical aftercare following surgery on the circulatory system: Secondary | ICD-10-CM | POA: Diagnosis present

## 2015-09-20 DIAGNOSIS — I10 Essential (primary) hypertension: Secondary | ICD-10-CM | POA: Insufficient documentation

## 2015-09-20 DIAGNOSIS — Z95828 Presence of other vascular implants and grafts: Secondary | ICD-10-CM | POA: Insufficient documentation

## 2015-09-20 NOTE — Progress Notes (Signed)
Patient presents today for follow-up of bilateral stage femoral-tibial bypasses. She looks extremely good today. She is here today with her daughter. She had the tissue loss bilaterally and underwent bilateral age bypasses in 41. She is completely amputation right. Tissue loss or legs currently and no rest pain or claudication type symptoms.  Past Medical History  Diagnosis Date  . Hyperlipidemia   . Carotid stenosis     Carotid US (12/2013): < 60% RCA, < 40% LICA.  [name] Hypertension   . Coronary artery disease     a. s/p MI 1995 tx w/ PTCA to Dx, Ramus, ap LAD;  b. LHC (11/2004):  Inferior HK, EF 60%, proximal LAD 50%, ostial D1 80%, mid D1 95%, mid LAD 50-60%, distal LAD at the apex 90%, OM1 occluded (fills late by left to left collaterals), RCA 75%. Medical therapy recommended.;  c. Lexiscan Myoview (12/2013):  Prior ant-lat scar with very small area of peri-infarct ischemia in ant wall, EF 40 (high risk)   . Obesity (BMI 30-39.9)   . Intrinsic asthma 01/28/2008     Mild intermittant asthma, PFT's 01/28/08     . Old anterolateral myocardial infarction   . PVD (peripheral vascular disease) 02/16/2014    2015, right femoral peroneal bypass grafting and left femoral-tibial bypass grafting by Dr. Arbie Cookey for rest pain as well as nonhealing ulcer on toe accompanied with toe amputation  Arteriogram October 2015 with patent graft on the right, 70% stenosis of graft on the left proximally.   . Chronic combined systolic and diastolic heart failure 04/13/2014  . Complete heart block     a. 05/2015 s/p [facility] Assurity DR model [id number] dual chamber PPM (serial number [id number]) (Dr. Johney Frame).  [name] Hx of echocardiogram     a. Echo 5/16: dist septal, dist ant, dist inf and apical AK, EF 40-45%, mod MR, mild LAE, PASP 72  . Chronic kidney disease (CKD), stage V     notes 07/03/2015; Dr. Hyman Hopes  . 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 Type 2 diabetes mellitus with renal  manifestations   . Presence of permanent cardiac pacemaker     Social History  Substance Use Topics  . Smoking status: Never Smoker   . Smokeless tobacco: Never Used  . Alcohol Use: No    Family History  Problem Relation Age of Onset  . Diabetes Father   . Hypertension Father   . Hypertension Daughter   . Hyperlipidemia Daughter   . Hyperlipidemia Son   . Hypertension Sister     No Known Allergies   Current outpatient prescriptions:  .  albuterol (PROVENTIL HFA;VENTOLIN HFA) 108 (90 BASE) MCG/ACT inhaler, Inhale 2 puffs into the lungs every 6 (six) hours as needed for wheezing or shortness of breath., Disp: , Rfl:  .  aspirin 81 MG tablet, Take 81 mg by mouth every morning. , Disp: , Rfl:  .  atorvastatin (LIPITOR) 40 MG tablet, Take 40 mg by mouth every morning. , Disp: , Rfl:  .  B Complex-C-Folic Acid (RENA-VITE RX) 1 MG TABS, Take 1 tablet by mouth every morning. , Disp: , Rfl:  .  carvedilol (COREG) 12.5 MG tablet, Take 1 tablet (12.5 mg total) by mouth 2 (two) times daily., Disp: 180 tablet, Rfl: 1 .  docusate sodium (COLACE) 100 MG capsule, Take 100 mg by mouth 2 (two) times daily. , Disp: , Rfl:  .  furosemide (LASIX) 40 MG tablet, Take 1 tablet (40  mg total) by mouth 2 (two) times daily., Disp: 60 tablet, Rfl: 0 .  hydrALAZINE (APRESOLINE) 10 MG tablet, Take 1 tablet by mouth 3 (three) times daily. , Disp: , Rfl:  .  insulin lispro (HUMALOG) 100 UNIT/ML injection, Inject 5 Units into the skin 2 (two) times daily as needed for high blood sugar. Only takes if CBG >250., Disp: , Rfl:  .  isosorbide mononitrate (IMDUR) 30 MG 24 hr tablet, Take 30 mg by mouth daily., Disp: , Rfl:  .  loratadine (CLARITIN) 10 MG tablet, Take 10 mg by mouth daily as needed for allergies., Disp: , Rfl:  .  Melatonin 3 MG TABS, Take 3 mg by mouth at bedtime as needed (for sleep). , Disp: , Rfl:  .  nitroGLYCERIN (NITROSTAT) 0.4 MG SL tablet, Place 1 tablet (0.4 mg total) under the tongue every 5  (five) minutes x 3 doses as needed for chest pain., Disp: 25 tablet, Rfl: 3 .  paricalcitol (ZEMPLAR) 1 MCG capsule, Take 1 mcg by mouth daily. , Disp: , Rfl:  .  Vitamin D, Ergocalciferol, (DRISDOL) 50000 UNITS CAPS capsule, Take 1 capsule (50,000 Units total) by mouth every 7 (seven) days., Disp: 10 capsule, Rfl: 0 .  JANUVIA 25 MG tablet, Take 25 mg by mouth daily., Disp: , Rfl:   Filed Vitals:   09/20/15 1125 09/20/15 1129  BP: 144/81 159/74  Pulse: 60   Temp: 98.2 F (36.8 C)   TempSrc: Oral   Resp: 18   Height:  (1.499 m)   Weight: 171 lb (77.565 kg)   SpO2: 100%     Body mass index is 34.52 kg/(m^2).       Physical exam she does have easily palpable subcutaneous in situ fem-tib graft pulses bilaterally.  Noninvasive studies today were reviewed with the patient and her daughter present. She has triphasic waveforms in her right peroneal and monophasic waveforms in her posterior tibial and dorsalis pedis pulses bilaterally. She does have some elevated velocities near the distal anastomosis on the right and some inflow stenosis in the right common femoral artery. There is some mild narrowing at the takeoff of her left fem-tib bypass  Impression and plan stable overall with patent bilateral fem-tib bypasses done for limb salvage. She is able to continue walking. I have recommended against any invasive evaluation of her asymptomatic narrowing of her bypasses. Feel that the risk for making  Worse outweighs the potential benefit of this. They're comfortable with this and will see [place] again in 6 months for continued follow-up

## 2015-09-20 NOTE — Telephone Encounter (Signed)
Patients daughter called and was upset that she was told at yesterdays visit that an rx for hydralazine would be sent in. I dont see where Dr Graciela Husbands has ever refilled this for the patient. Ok to refill? Please advise. Thanks, MI

## 2015-09-20 NOTE — Progress Notes (Signed)
Filed Vitals:   09/20/15 1125 09/20/15 1129  BP: 144/81 159/74  Pulse: 60   Temp: 98.2 F (36.8 C)   TempSrc: Oral   Resp: 18   Height:  (1.499 m)   Weight: 171 lb (77.565 kg)   SpO2: 100%

## 2015-09-20 NOTE — Addendum Note (Signed)
Addended by: Adria Dill L on: 09/20/2015 05:11 PM   Modules accepted: Orders

## 2015-09-21 MED ORDER — HYDRALAZINE HCL 10 MG PO TABS: 10.0000 mg | ORAL_TABLET | Freq: Three times a day (TID) | ORAL | Status: AC

## 2015-09-21 NOTE — Telephone Encounter (Signed)
I apologize- her daughter did ask me to do this on the way out the door from the visit and I forgot. I have sent the refill in.

## 2015-10-03 ENCOUNTER — Encounter (HOSPITAL_COMMUNITY)
Admission: RE | Admit: 2015-10-03 | Discharge: 2015-10-03 | Disposition: A | Discharge status: Home / Self Care | Payer: Medicare Other | Source: Ambulatory Visit | Attending: Nephrology | Admitting: Nephrology

## 2015-10-03 DIAGNOSIS — D631 Anemia in chronic kidney disease: Secondary | ICD-10-CM | POA: Insufficient documentation

## 2015-10-03 DIAGNOSIS — N183 Chronic kidney disease, stage 3 (moderate): Secondary | ICD-10-CM | POA: Diagnosis not present

## 2015-10-03 MED ORDER — DARBEPOETIN ALFA 40 MCG/0.4ML IJ SOSY
PREFILLED_SYRINGE | INTRAMUSCULAR | Status: AC
  Filled 2015-10-03: qty 0.4

## 2015-10-03 MED ORDER — DARBEPOETIN ALFA 40 MCG/0.4ML IJ SOSY
40.0000 ug | PREFILLED_SYRINGE | INTRAMUSCULAR | Status: DC
  Administered 2015-10-03: 40 ug via SUBCUTANEOUS

## 2015-10-13 ENCOUNTER — Telehealth: Payer: Self-pay | Admitting: Interventional Cardiology

## 2015-10-13 NOTE — Telephone Encounter (Signed)
Error

## 2015-10-17 ENCOUNTER — Encounter (HOSPITAL_COMMUNITY)
Admission: RE | Admit: 2015-10-17 | Discharge: 2015-10-17 | Disposition: A | Discharge status: Home / Self Care | Payer: Medicare Other | Source: Ambulatory Visit | Attending: Nephrology | Admitting: Nephrology

## 2015-10-17 DIAGNOSIS — D631 Anemia in chronic kidney disease: Secondary | ICD-10-CM | POA: Diagnosis not present

## 2015-10-17 MED ORDER — DARBEPOETIN ALFA 40 MCG/0.4ML IJ SOSY
40.0000 ug | PREFILLED_SYRINGE | INTRAMUSCULAR | Status: DC
  Administered 2015-10-17: 40 ug via SUBCUTANEOUS

## 2015-10-17 MED ORDER — DARBEPOETIN ALFA 40 MCG/0.4ML IJ SOSY
PREFILLED_SYRINGE | INTRAMUSCULAR | Status: AC
  Filled 2015-10-17: qty 0.4

## 2015-10-28 ENCOUNTER — Other Ambulatory Visit (HOSPITAL_COMMUNITY): Payer: Self-pay | Admitting: *Deleted

## 2015-10-31 ENCOUNTER — Encounter (HOSPITAL_COMMUNITY)
Admission: RE | Admit: 2015-10-31 | Discharge: 2015-10-31 | Disposition: A | Discharge status: Home / Self Care | Payer: Medicare Other | Source: Ambulatory Visit | Attending: Nephrology | Admitting: Nephrology

## 2015-10-31 DIAGNOSIS — D631 Anemia in chronic kidney disease: Secondary | ICD-10-CM | POA: Diagnosis not present

## 2015-10-31 MED ORDER — DARBEPOETIN ALFA 40 MCG/0.4ML IJ SOSY
40.0000 ug | PREFILLED_SYRINGE | INTRAMUSCULAR | Status: DC
  Administered 2015-10-31: 40 ug via SUBCUTANEOUS
  Filled 2015-10-31: qty 0.4

## 2015-11-09 ENCOUNTER — Encounter: Payer: Self-pay | Admitting: Interventional Cardiology

## 2015-11-09 ENCOUNTER — Ambulatory Visit (INDEPENDENT_AMBULATORY_CARE_PROVIDER_SITE_OTHER): Payer: Medicare Other | Admitting: Interventional Cardiology

## 2015-11-09 VITALS — BP 128/68 | HR 60 | Ht 59.0 in | Wt 176.8 lb

## 2015-11-09 DIAGNOSIS — I5032 Chronic diastolic (congestive) heart failure: Secondary | ICD-10-CM

## 2015-11-09 DIAGNOSIS — I10 Essential (primary) hypertension: Secondary | ICD-10-CM

## 2015-11-09 DIAGNOSIS — I6523 Occlusion and stenosis of bilateral carotid arteries: Secondary | ICD-10-CM

## 2015-11-09 DIAGNOSIS — N185 Chronic kidney disease, stage 5: Secondary | ICD-10-CM | POA: Diagnosis not present

## 2015-11-09 DIAGNOSIS — I739 Peripheral vascular disease, unspecified: Secondary | ICD-10-CM

## 2015-11-09 DIAGNOSIS — I429 Cardiomyopathy, unspecified: Secondary | ICD-10-CM

## 2015-11-09 MED ORDER — FUROSEMIDE 40 MG PO TABS: 40.0000 mg | ORAL_TABLET | Freq: Two times a day (BID) | ORAL | Status: DC

## 2015-11-09 NOTE — Progress Notes (Signed)
Cardiology Office Note   Date:  11/09/2015   ID:  Dorothy Bartlett, DOB 1926/09/24, MRN [id number]  PCP:  Eliott Nine, MD  Cardiologist:  Lesleigh Noe, MD   Chief Complaint  Patient presents with  . Congestive Heart Failure      History of Present Illness: Dorothy Bartlett is a 81 y.o. female who presents for in stage kidney disease, chronic systolic heart failure, hypertension, CAD, and diabetes mellitus. Bilateral carotid disease.  Dyspnea on exertion is noted. Weight is been stable. No chest discomfort. Denies orthopnea and PND.    Past Medical History  Diagnosis Date  . Hyperlipidemia   . Carotid stenosis     Carotid US (12/2013): < 60% RCA, < 40% LICA.  [name] Hypertension   . Coronary artery disease     a. s/p MI 1995 tx w/ PTCA to Dx, Ramus, ap LAD;  b. LHC (11/2004):  Inferior HK, EF 60%, proximal LAD 50%, ostial D1 80%, mid D1 95%, mid LAD 50-60%, distal LAD at the apex 90%, OM1 occluded (fills late by left to left collaterals), RCA 75%. Medical therapy recommended.;  c. Lexiscan Myoview (12/2013):  Prior ant-lat scar with very small area of peri-infarct ischemia in ant wall, EF 40 (high risk)   . Obesity (BMI 30-39.9)   . Intrinsic asthma 01/28/2008     Mild intermittant asthma, PFT's 01/28/08     . Old anterolateral myocardial infarction   . PVD (peripheral vascular disease) (HCC) 02/16/2014    2015, right femoral peroneal bypass grafting and left femoral-tibial bypass grafting by Dr. Arbie Cookey for rest pain as well as nonhealing ulcer on toe accompanied with toe amputation  Arteriogram October 2015 with patent graft on the right, 70% stenosis of graft on the left proximally.   . Chronic combined systolic and diastolic heart failure (HCC) 04/13/2014  . Complete heart block (HCC)     a. 05/2015 s/p [facility] Assurity DR model [phone] dual chamber PPM (serial number [id number]) (Dr. Johney Frame).  [name] Hx of echocardiogram     a. Echo 5/16: dist septal, dist ant, dist inf  and apical AK, EF 40-45%, mod MR, mild LAE, PASP 72  . Chronic kidney disease (CKD), stage V (HCC)     notes 07/03/2015; Dr. Hyman Hopes  . 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 Type 2 diabetes mellitus with renal manifestations (HCC)   . Presence of permanent cardiac pacemaker     Past Surgical History  Procedure Laterality Date  . Repair of nerve in left arm  Left 2000  . Cardiac catheterization  1995    w/ PTCA Diag 2nd MI  . Femoral-tibial bypass graft Left 02/24/2014    Procedure: BYPASS GRAFT FEMORAL-TIBIAL ARTERY;  Surgeon: Larina Earthly, MD;  Location: [facility] OR;  Service: Vascular;  Laterality: Left;  . Femoral-popliteal bypass graft Right 08/04/2014    Procedure: RIGHT FEMORAL-PERONEAL ARTERY BYPASS GRAFT;  Surgeon: Larina Earthly, MD;  Location: [facility] OR;  Service: Vascular;  Laterality: Right;  . Amputation Right 08/04/2014    Procedure: AMPUTATION DIGIT-RIGHT 3RD TOE;  Surgeon: Larina Earthly, MD;  Location: [facility] OR;  Service: Vascular;  Laterality: Right;  . Lower extremity angiogram Left 01/08/2014    Procedure: LOWER EXTREMITY ANGIOGRAM;  Surgeon: Sherren Kerns, MD;  Location: [facility] CATH LAB;  Service: Cardiovascular;  Laterality: Left;  . Abdominal angiogram  01/08/2014    Procedure: ABDOMINAL ANGIOGRAM;  Surgeon: Sherren Kerns,  MD;  Location: [facility] CATH LAB;  Service: Cardiovascular;;  . Lower extremity angiogram Right 08/03/2014    Procedure: LOWER EXTREMITY ANGIOGRAM;  Surgeon: Nada Libman, MD;  Location: [facility] CATH LAB;  Service: Cardiovascular;  Laterality: Right;  . Abdominal aortagram N/A 10/25/2014    Procedure: ABDOMINAL Ronny Flurry;  Surgeon: Chuck Hint, MD;  Location: [facility] CATH LAB;  Service: Cardiovascular;  Laterality: N/A;  . Ep implantable device N/A 05/10/2015    Procedure: Pacemaker Implant;  Surgeon: Hillis Range, MD;  Location: [facility] INVASIVE CV LAB;  Service: Cardiovascular;  Laterality: N/A;  . Ep implantable device N/A 06/13/2015     Procedure: Lead Revision/Repair;  Surgeon: Duke Salvia, MD;  Location: [facility] INVASIVE CV LAB;  Service: Cardiovascular;  Laterality: N/A;  . Pacemaker lead removal  06/13/2015    Procedure: Pacemaker Lead Removal;  Surgeon: Duke Salvia, MD;  Location: [facility] INVASIVE CV LAB;  Service: Cardiovascular;;  . Insert / replace / remove pacemaker       Current Outpatient Prescriptions  Medication Sig Dispense Refill  . albuterol (PROVENTIL HFA;VENTOLIN HFA) 108 (90 BASE) MCG/ACT inhaler Inhale 2 puffs into the lungs every 6 (six) hours as needed for wheezing or shortness of breath.    [name] aspirin 81 MG tablet Take 81 mg by mouth every morning.     [name] atorvastatin (LIPITOR) 40 MG tablet Take 40 mg by mouth every morning.     . B Complex-C-Folic Acid (RENA-VITE RX) 1 MG TABS Take 1 tablet by mouth every morning.     . carvedilol (COREG) 12.5 MG tablet Take 1 tablet (12.5 mg total) by mouth 2 (two) times daily. 180 tablet 1  . docusate sodium (COLACE) 100 MG capsule Take 100 mg by mouth 2 (two) times daily.     . furosemide (LASIX) 40 MG tablet Take 1 tablet (40 mg total) by mouth 2 (two) times daily. 60 tablet 0  . hydrALAZINE (APRESOLINE) 10 MG tablet Take 1 tablet (10 mg total) by mouth 3 (three) times daily. 270 tablet 3  . insulin lispro (HUMALOG) 100 UNIT/ML injection Inject 5 Units into the skin 2 (two) times daily as needed for high blood sugar. Only takes if CBG >250.    [name] isosorbide mononitrate (IMDUR) 30 MG 24 hr tablet Take 30 mg by mouth daily.    [name] JANUVIA 25 MG tablet Take 25 mg by mouth daily.    [name] loratadine (CLARITIN) 10 MG tablet Take 10 mg by mouth daily as needed for allergies.    . Melatonin 3 MG TABS Take 3 mg by mouth at bedtime as needed (for sleep).     . nitroGLYCERIN (NITROSTAT) 0.4 MG SL tablet Place 1 tablet (0.4 mg total) under the tongue every 5 (five) minutes x 3 doses as needed for chest pain. 25 tablet 3  . paricalcitol (ZEMPLAR) 1 MCG capsule Take 1 mcg by mouth daily.       . Vitamin D, Ergocalciferol, (DRISDOL) 50000 UNITS CAPS capsule Take 1 capsule (50,000 Units total) by mouth every 7 (seven) days. 10 capsule 0   No current facility-administered medications for this visit.    Allergies:   Review of patient's allergies indicates no known allergies.    Social History:  The patient  reports that she has never smoked. She has never used smokeless tobacco. She reports that she does not drink alcohol or use illicit drugs.   Family History:  The patient's family history includes Diabetes in her father; Hyperlipidemia  in her daughter and son; Hypertension in her daughter, father, and sister.    ROS:  Please see the history of present illness.   Otherwise, review of systems are positive for stable appetite and stable weight. Easy bruising is noted..   All other systems are reviewed and negative.    PHYSICAL EXAM: VS:  BP 128/68 mmHg  Pulse 60  Ht 4\' 11"  (1.499 m)  Wt 80.196 kg (176 lb 12.8 oz)  BMI 35.69 kg/m2  SpO2 97% , BMI Body mass index is 35.69 kg/(m^2). GEN: Well nourished, well developed, in no acute distress HEENT: normal Neck: no JVD, carotid bruits, or masses Cardiac: RRR.  There is no murmur, rub, or gallop. There is 2+ bilateral leg edema. Respiratory:  clear to auscultation bilaterally, normal work of breathing. GI: soft, nontender, nondistended, + BS MS: no deformity or atrophy Skin: warm and dry, no rash Neuro:  Strength and sensation are intact Psych: euthymic mood, full affect   EKG:  EKG is not ordered today   Recent Labs: 05/08/2015: TSH 2.772 06/12/2015: Magnesium 2.2 07/03/2015: B Natriuretic Peptide 2462.5* 07/04/2015: ALT 43 07/05/2015: BUN 60*; Creatinine, Ser 3.39*; Hemoglobin 8.7*; Platelets 168; Potassium 4.1; Sodium 139    Lipid Panel No results found for: CHOL, TRIG, HDL, CHOLHDL, VLDL, LDLCALC, LDLDIRECT    Wt Readings from Last 3 Encounters:  11/09/15 80.196 kg (176 lb 12.8 oz)  09/20/15 77.565 kg (171 lb)   09/19/15 78.019 kg (172 lb)      Other studies Reviewed: Additional studies/ records that were reviewed today include: None. The findings include none.    ASSESSMENT AND PLAN:  1. Chronic diastolic heart failure (HCC) Moderate volume overload - furosemide (LASIX) 40 MG tablet; Take 1 tablet (40 mg total) by mouth 2 (two) times daily.  Dispense: 180 tablet  2. CKD stage 5, GFR less than 15 ml/min Decided against dialysis  3. Essential hypertension Controlled - furosemide (LASIX) 40 MG tablet; Take 1 tablet (40 mg total) by mouth 2 (two) times daily.  Dispense: 180 tablet  4. Cardiomyopathy-EF 40-45% by echo May 2016 Asymptomatic  5. Carotid stenosis, bilateral Asymptomatic  6. PAD (peripheral artery disease) (HCC) Improved since surgery.    Current medicines are reviewed at length with the patient today.  The patient has the following concerns regarding medicines: None.  The following changes/actions have been instituted:    Control sodium in diet  Daily weights and call if increase noted  Labs/ tests ordered today include:  No orders of the defined types were placed in this encounter.     Disposition:   FU with HS in 6 months  Signed, Lesleigh NoeSMITH III,Maysen Bonsignore W, MD  11/09/2015 10:29 AM    [facility] [address], [place], [place]  [address] Phone: [phone]; Fax: [phone]

## 2015-11-09 NOTE — Patient Instructions (Signed)
Medication Instructions:  Your physician recommends that you continue on your current medications as directed. Please refer to the Current Medication list given to you today.   Labwork: None ordered  Testing/Procedures: Non ordered  Follow-Up: Your physician wants you to follow-up in: 6 months with Dr.Smith You will receive a reminder letter in the mail two months in advance. If you don't receive a letter, please call our office to schedule the follow-up appointment.   Any Other Special Instructions Will Be Listed Below (If Applicable).     If you need a refill on your cardiac medications before your next appointment, please call your pharmacy.   

## 2015-11-14 ENCOUNTER — Encounter (HOSPITAL_COMMUNITY)
Admission: RE | Admit: 2015-11-14 | Discharge: 2015-11-14 | Disposition: A | Discharge status: Home / Self Care | Payer: Medicare Other | Source: Ambulatory Visit | Attending: Nephrology | Admitting: Nephrology

## 2015-11-14 DIAGNOSIS — N183 Chronic kidney disease, stage 3 (moderate): Secondary | ICD-10-CM | POA: Insufficient documentation

## 2015-11-14 DIAGNOSIS — D631 Anemia in chronic kidney disease: Secondary | ICD-10-CM | POA: Insufficient documentation

## 2015-11-14 MED ORDER — DARBEPOETIN ALFA 40 MCG/0.4ML IJ SOSY
PREFILLED_SYRINGE | INTRAMUSCULAR | Status: AC
  Filled 2015-11-14: qty 0.4

## 2015-11-14 MED ORDER — DARBEPOETIN ALFA 40 MCG/0.4ML IJ SOSY
40.0000 ug | PREFILLED_SYRINGE | INTRAMUSCULAR | Status: DC
  Administered 2015-11-14: 40 ug via SUBCUTANEOUS

## 2015-11-17 ENCOUNTER — Telehealth: Payer: Self-pay

## 2015-11-17 NOTE — Telephone Encounter (Signed)
Signed orders place in MR fax box [facility]

## 2015-11-28 ENCOUNTER — Encounter (HOSPITAL_COMMUNITY)
Admission: RE | Admit: 2015-11-28 | Discharge: 2015-11-28 | Disposition: A | Discharge status: Home / Self Care | Payer: Medicare Other | Source: Ambulatory Visit | Attending: Nephrology | Admitting: Nephrology

## 2015-11-28 DIAGNOSIS — D631 Anemia in chronic kidney disease: Secondary | ICD-10-CM | POA: Diagnosis not present

## 2015-11-28 MED ORDER — DARBEPOETIN ALFA 40 MCG/0.4ML IJ SOSY
40.0000 ug | PREFILLED_SYRINGE | INTRAMUSCULAR | Status: DC
  Administered 2015-11-28: 40 ug via SUBCUTANEOUS
  Filled 2015-11-28: qty 0.4

## 2015-12-01 ENCOUNTER — Encounter: Payer: Self-pay | Admitting: Podiatry

## 2015-12-01 ENCOUNTER — Ambulatory Visit: Payer: Medicare Other | Admitting: Podiatry

## 2015-12-01 ENCOUNTER — Ambulatory Visit (INDEPENDENT_AMBULATORY_CARE_PROVIDER_SITE_OTHER): Payer: Medicare Other | Admitting: Podiatry

## 2015-12-01 DIAGNOSIS — B351 Tinea unguium: Secondary | ICD-10-CM

## 2015-12-01 DIAGNOSIS — M79606 Pain in leg, unspecified: Secondary | ICD-10-CM

## 2015-12-01 NOTE — Patient Instructions (Signed)
Seen for hypertrophic nails. All nails debrided. Return in 3 months or as needed.  

## 2015-12-01 NOTE — Progress Notes (Signed)
Subjective:  81 year old female presents complaining of painful nails. She is accompanied by her daughter via wheel chair, requesting toe nails trimmed. Patient is in good mood without other complaints.  Objective: Vascular: Pedal pulses are not palpable.  Positive of forefoot edema bilateral. Dermatologic: Discolored and hypertrophic nails x 9. Neurologic: All epicritic sensations grossly intact with hyperalgic foot left.  Orthopedic: Status post removal of 3rd digit right. Severe STJ pronation with flattening arch upon weight bearing.   Assessment:  Deformed mycotic nails x 9.  Plan:  All nails debrided. Return in 3 month or as needed.

## 2015-12-02 ENCOUNTER — Ambulatory Visit: Payer: Medicare Other | Admitting: Podiatry

## 2015-12-09 ENCOUNTER — Encounter (HOSPITAL_COMMUNITY): Payer: Medicare Other

## 2015-12-12 ENCOUNTER — Encounter (HOSPITAL_COMMUNITY)
Admission: RE | Admit: 2015-12-12 | Discharge: 2015-12-12 | Disposition: A | Discharge status: Home / Self Care | Payer: Medicare Other | Source: Ambulatory Visit | Attending: Nephrology | Admitting: Nephrology

## 2015-12-12 ENCOUNTER — Encounter (HOSPITAL_COMMUNITY): Payer: Medicare Other

## 2015-12-12 DIAGNOSIS — N183 Chronic kidney disease, stage 3 (moderate): Secondary | ICD-10-CM | POA: Insufficient documentation

## 2015-12-12 DIAGNOSIS — D631 Anemia in chronic kidney disease: Secondary | ICD-10-CM | POA: Insufficient documentation

## 2015-12-12 MED ORDER — DARBEPOETIN ALFA 40 MCG/0.4ML IJ SOSY
PREFILLED_SYRINGE | INTRAMUSCULAR | Status: DC
Start: 2015-12-12 — End: 2015-12-13
  Filled 2015-12-12: qty 0.4

## 2015-12-12 MED ORDER — DARBEPOETIN ALFA 40 MCG/0.4ML IJ SOSY
40.0000 ug | PREFILLED_SYRINGE | INTRAMUSCULAR | Status: DC
  Administered 2015-12-12: 40 ug via SUBCUTANEOUS

## 2015-12-19 ENCOUNTER — Ambulatory Visit (INDEPENDENT_AMBULATORY_CARE_PROVIDER_SITE_OTHER): Payer: Medicare Other | Admitting: *Deleted

## 2015-12-19 DIAGNOSIS — I442 Atrioventricular block, complete: Secondary | ICD-10-CM | POA: Diagnosis not present

## 2015-12-19 LAB — CUP PACEART REMOTE DEVICE CHECK
Battery Remaining Longevity: 119 mo
Battery Remaining Percentage: 95.5 %
Brady Statistic AP VS Percent: 1 %
Brady Statistic RA Percent Paced: 27 %
Brady Statistic RV Percent Paced: 99 %
Implantable Lead Location: 753860
Lead Channel Impedance Value: 460 Ohm
Lead Channel Pacing Threshold Amplitude: 0.625 V
Lead Channel Pacing Threshold Pulse Width: 0.4 ms
Lead Channel Sensing Intrinsic Amplitude: 1.4 mV
Lead Channel Setting Sensing Sensitivity: 5 mV
MDC IDC LEAD IMPLANT DT: 20160510
MDC IDC LEAD IMPLANT DT: 20160510
MDC IDC LEAD LOCATION: 753859
MDC IDC MSMT BATTERY VOLTAGE: 3.01 V
MDC IDC MSMT LEADCHNL RA PACING THRESHOLD AMPLITUDE: 0.75 V
MDC IDC MSMT LEADCHNL RV IMPEDANCE VALUE: 480 Ohm
MDC IDC MSMT LEADCHNL RV PACING THRESHOLD PULSEWIDTH: 0.5 ms
MDC IDC MSMT LEADCHNL RV SENSING INTR AMPL: 12 mV
MDC IDC PG SERIAL: 7759868
MDC IDC SESS DTM: 20161219070012
MDC IDC SET LEADCHNL RA PACING AMPLITUDE: 1.75 V
MDC IDC SET LEADCHNL RV PACING AMPLITUDE: 0.875
MDC IDC SET LEADCHNL RV PACING PULSEWIDTH: 0.5 ms
MDC IDC STAT BRADY AP VP PERCENT: 28 %
MDC IDC STAT BRADY AS VP PERCENT: 71 %
MDC IDC STAT BRADY AS VS PERCENT: 1 %

## 2015-12-19 NOTE — Progress Notes (Signed)
Remote pacemaker transmission.   

## 2015-12-22 ENCOUNTER — Encounter: Payer: Self-pay | Admitting: Cardiology

## 2015-12-23 ENCOUNTER — Encounter (HOSPITAL_COMMUNITY)
Admission: RE | Admit: 2015-12-23 | Discharge: 2015-12-23 | Disposition: A | Discharge status: Home / Self Care | Payer: Medicare Other | Source: Ambulatory Visit | Attending: Nephrology | Admitting: Nephrology

## 2015-12-23 DIAGNOSIS — D631 Anemia in chronic kidney disease: Secondary | ICD-10-CM | POA: Diagnosis not present

## 2015-12-23 MED ORDER — DARBEPOETIN ALFA 40 MCG/0.4ML IJ SOSY
PREFILLED_SYRINGE | INTRAMUSCULAR | Status: AC
  Filled 2015-12-23: qty 0.4

## 2015-12-23 MED ORDER — DARBEPOETIN ALFA 40 MCG/0.4ML IJ SOSY
40.0000 ug | PREFILLED_SYRINGE | INTRAMUSCULAR | Status: DC
  Administered 2015-12-23: 40 ug via SUBCUTANEOUS

## 2016-01-06 ENCOUNTER — Encounter (HOSPITAL_COMMUNITY)
Admission: RE | Admit: 2016-01-06 | Discharge: 2016-01-06 | Disposition: A | Discharge status: Home / Self Care | Payer: Medicare Other | Source: Ambulatory Visit | Attending: Nephrology | Admitting: Nephrology

## 2016-01-06 DIAGNOSIS — N183 Chronic kidney disease, stage 3 (moderate): Secondary | ICD-10-CM | POA: Diagnosis not present

## 2016-01-06 DIAGNOSIS — D631 Anemia in chronic kidney disease: Secondary | ICD-10-CM | POA: Diagnosis present

## 2016-01-06 MED ORDER — DARBEPOETIN ALFA 40 MCG/0.4ML IJ SOSY
40.0000 ug | PREFILLED_SYRINGE | INTRAMUSCULAR | Status: DC
  Administered 2016-01-06: 40 ug via SUBCUTANEOUS

## 2016-01-06 MED ORDER — DARBEPOETIN ALFA 40 MCG/0.4ML IJ SOSY
PREFILLED_SYRINGE | INTRAMUSCULAR | Status: AC
  Filled 2016-01-06: qty 0.4

## 2016-01-20 ENCOUNTER — Encounter (HOSPITAL_COMMUNITY)
Admission: RE | Admit: 2016-01-20 | Discharge: 2016-01-20 | Disposition: A | Discharge status: Home / Self Care | Payer: Medicare Other | Source: Ambulatory Visit | Attending: Nephrology | Admitting: Nephrology

## 2016-01-20 DIAGNOSIS — D631 Anemia in chronic kidney disease: Secondary | ICD-10-CM | POA: Diagnosis not present

## 2016-01-20 MED ORDER — DARBEPOETIN ALFA 40 MCG/0.4ML IJ SOSY
PREFILLED_SYRINGE | INTRAMUSCULAR | Status: AC
  Filled 2016-01-20: qty 0.4

## 2016-01-20 MED ORDER — DARBEPOETIN ALFA 40 MCG/0.4ML IJ SOSY
40.0000 ug | PREFILLED_SYRINGE | INTRAMUSCULAR | Status: DC
  Administered 2016-01-20: 40 ug via SUBCUTANEOUS

## 2016-02-02 ENCOUNTER — Other Ambulatory Visit (HOSPITAL_COMMUNITY): Payer: Self-pay | Admitting: *Deleted

## 2016-02-03 ENCOUNTER — Encounter (HOSPITAL_COMMUNITY): Payer: Medicare Other

## 2016-02-03 ENCOUNTER — Encounter (HOSPITAL_COMMUNITY)
Admission: RE | Admit: 2016-02-03 | Discharge: 2016-02-03 | Disposition: A | Discharge status: Home / Self Care | Payer: Medicare Other | Source: Ambulatory Visit | Attending: Nephrology | Admitting: Nephrology

## 2016-02-03 DIAGNOSIS — D631 Anemia in chronic kidney disease: Secondary | ICD-10-CM | POA: Diagnosis not present

## 2016-02-03 DIAGNOSIS — N183 Chronic kidney disease, stage 3 (moderate): Secondary | ICD-10-CM | POA: Insufficient documentation

## 2016-02-03 MED ORDER — DARBEPOETIN ALFA 40 MCG/0.4ML IJ SOSY
40.0000 ug | PREFILLED_SYRINGE | INTRAMUSCULAR | Status: DC
  Administered 2016-02-03: 40 ug via SUBCUTANEOUS

## 2016-02-03 MED ORDER — DARBEPOETIN ALFA 40 MCG/0.4ML IJ SOSY
PREFILLED_SYRINGE | INTRAMUSCULAR | Status: AC
  Filled 2016-02-03: qty 0.4

## 2016-02-13 ENCOUNTER — Encounter: Payer: Self-pay | Admitting: Cardiology

## 2016-02-17 ENCOUNTER — Encounter (HOSPITAL_COMMUNITY)
Admission: RE | Admit: 2016-02-17 | Discharge: 2016-02-17 | Disposition: A | Discharge status: Home / Self Care | Payer: Medicare Other | Source: Ambulatory Visit | Attending: Nephrology | Admitting: Nephrology

## 2016-02-17 DIAGNOSIS — D631 Anemia in chronic kidney disease: Secondary | ICD-10-CM | POA: Diagnosis not present

## 2016-02-17 MED ORDER — DARBEPOETIN ALFA 40 MCG/0.4ML IJ SOSY
40.0000 ug | PREFILLED_SYRINGE | INTRAMUSCULAR | Status: DC
  Administered 2016-02-17: 40 ug via SUBCUTANEOUS

## 2016-02-17 MED ORDER — DARBEPOETIN ALFA 40 MCG/0.4ML IJ SOSY
PREFILLED_SYRINGE | INTRAMUSCULAR | Status: AC
  Filled 2016-02-17: qty 0.4

## 2016-02-29 ENCOUNTER — Ambulatory Visit: Payer: Medicare Other | Admitting: Podiatry

## 2016-03-02 ENCOUNTER — Encounter (HOSPITAL_COMMUNITY): Payer: Medicare Other

## 2016-03-05 ENCOUNTER — Encounter (HOSPITAL_COMMUNITY)
Admission: RE | Admit: 2016-03-05 | Discharge: 2016-03-05 | Disposition: A | Discharge status: Home / Self Care | Payer: Medicare Other | Source: Ambulatory Visit | Attending: Nephrology | Admitting: Nephrology

## 2016-03-05 DIAGNOSIS — N183 Chronic kidney disease, stage 3 (moderate): Secondary | ICD-10-CM | POA: Insufficient documentation

## 2016-03-05 DIAGNOSIS — D631 Anemia in chronic kidney disease: Secondary | ICD-10-CM | POA: Diagnosis present

## 2016-03-05 MED ORDER — DARBEPOETIN ALFA 40 MCG/0.4ML IJ SOSY
40.0000 ug | PREFILLED_SYRINGE | INTRAMUSCULAR | Status: DC
  Administered 2016-03-05: 40 ug via SUBCUTANEOUS

## 2016-03-07 ENCOUNTER — Ambulatory Visit (INDEPENDENT_AMBULATORY_CARE_PROVIDER_SITE_OTHER): Payer: Medicare Other | Admitting: Podiatry

## 2016-03-07 ENCOUNTER — Encounter: Payer: Self-pay | Admitting: Podiatry

## 2016-03-07 VITALS — BP 132/64 | HR 61

## 2016-03-07 DIAGNOSIS — B351 Tinea unguium: Secondary | ICD-10-CM

## 2016-03-07 DIAGNOSIS — M79606 Pain in leg, unspecified: Secondary | ICD-10-CM

## 2016-03-07 NOTE — Patient Instructions (Signed)
Seen for hypertrophic nails. All nails debrided. Return in 3 months or as needed.  

## 2016-03-07 NOTE — Progress Notes (Signed)
Subjective:  82 year old female presents complaining of painful nails. She is accompanied by her daughter via wheel chair, requesting toe nails trimmed.  Patient has no other complaints.  Objective: Vascular: Pedal pulses are not palpable.  Positive of forefoot edema bilateral. Dermatologic: Discolored and hypertrophic nails x 9. Neurologic: All epicritic sensations grossly intact with hyperalgic foot left.  Orthopedic: Status post removal of 3rd digit right. Severe STJ pronation with flattening arch upon weight bearing.   Assessment:  Deformed mycotic nails x 9. Painful nails with ambulation.   Plan:  All nails debrided. Return in 3 month or as needed.

## 2016-03-16 ENCOUNTER — Encounter (HOSPITAL_COMMUNITY)
Admission: RE | Admit: 2016-03-16 | Discharge: 2016-03-16 | Disposition: A | Discharge status: Home / Self Care | Payer: Medicare Other | Source: Ambulatory Visit | Attending: Nephrology | Admitting: Nephrology

## 2016-03-16 DIAGNOSIS — D631 Anemia in chronic kidney disease: Secondary | ICD-10-CM | POA: Diagnosis not present

## 2016-03-16 MED ORDER — DARBEPOETIN ALFA 40 MCG/0.4ML IJ SOSY
PREFILLED_SYRINGE | INTRAMUSCULAR | Status: AC
  Filled 2016-03-16: qty 0.4

## 2016-03-16 MED ORDER — DARBEPOETIN ALFA 40 MCG/0.4ML IJ SOSY
40.0000 ug | PREFILLED_SYRINGE | INTRAMUSCULAR | Status: DC
  Administered 2016-03-16: 40 ug via SUBCUTANEOUS

## 2016-03-19 ENCOUNTER — Ambulatory Visit (INDEPENDENT_AMBULATORY_CARE_PROVIDER_SITE_OTHER): Payer: Medicare Other | Admitting: *Deleted

## 2016-03-19 DIAGNOSIS — I442 Atrioventricular block, complete: Secondary | ICD-10-CM

## 2016-03-20 NOTE — Progress Notes (Signed)
Remote pacemaker transmission.   

## 2016-04-02 ENCOUNTER — Encounter (HOSPITAL_COMMUNITY)
Admission: RE | Admit: 2016-04-02 | Discharge: 2016-04-02 | Disposition: A | Discharge status: Home / Self Care | Payer: Medicare Other | Source: Ambulatory Visit | Attending: Nephrology | Admitting: Nephrology

## 2016-04-02 DIAGNOSIS — N183 Chronic kidney disease, stage 3 (moderate): Secondary | ICD-10-CM | POA: Insufficient documentation

## 2016-04-02 DIAGNOSIS — D631 Anemia in chronic kidney disease: Secondary | ICD-10-CM | POA: Diagnosis not present

## 2016-04-02 MED ORDER — DARBEPOETIN ALFA 40 MCG/0.4ML IJ SOSY
PREFILLED_SYRINGE | INTRAMUSCULAR | Status: AC
  Administered 2016-04-02: 40 ug via SUBCUTANEOUS
  Filled 2016-04-02: qty 0.4

## 2016-04-02 MED ORDER — DARBEPOETIN ALFA 40 MCG/0.4ML IJ SOSY: 40.0000 ug | PREFILLED_SYRINGE | INTRAMUSCULAR | Status: DC

## 2016-04-12 ENCOUNTER — Other Ambulatory Visit (HOSPITAL_COMMUNITY): Payer: Self-pay | Admitting: Nephrology

## 2016-04-13 ENCOUNTER — Encounter (HOSPITAL_COMMUNITY)
Admission: RE | Admit: 2016-04-13 | Discharge: 2016-04-13 | Disposition: A | Discharge status: Home / Self Care | Payer: Medicare Other | Source: Ambulatory Visit | Attending: Nephrology | Admitting: Nephrology

## 2016-04-13 DIAGNOSIS — D631 Anemia in chronic kidney disease: Secondary | ICD-10-CM | POA: Diagnosis not present

## 2016-04-13 MED ORDER — DARBEPOETIN ALFA 40 MCG/0.4ML IJ SOSY
PREFILLED_SYRINGE | INTRAMUSCULAR | Status: AC
  Filled 2016-04-13: qty 0.4

## 2016-04-13 MED ORDER — DARBEPOETIN ALFA 40 MCG/0.4ML IJ SOSY
40.0000 ug | PREFILLED_SYRINGE | INTRAMUSCULAR | Status: DC
  Administered 2016-04-13: 40 ug via SUBCUTANEOUS

## 2016-04-20 LAB — CUP PACEART REMOTE DEVICE CHECK
Battery Remaining Longevity: 128 mo
Battery Remaining Percentage: 95.5 %
Brady Statistic AP VS Percent: 1 %
Brady Statistic AS VS Percent: 1 %
Brady Statistic RV Percent Paced: 99 %
Date Time Interrogation Session: 20170320060321
Implantable Lead Location: 753859
Lead Channel Impedance Value: 450 Ohm
Lead Channel Pacing Threshold Amplitude: 0.625 V
Lead Channel Pacing Threshold Pulse Width: 0.5 ms
Lead Channel Setting Pacing Amplitude: 0.875
Lead Channel Setting Pacing Amplitude: 1.5 V
Lead Channel Setting Pacing Pulse Width: 0.5 ms
MDC IDC LEAD IMPLANT DT: 20160510
MDC IDC LEAD IMPLANT DT: 20160510
MDC IDC LEAD LOCATION: 753860
MDC IDC MSMT BATTERY VOLTAGE: 2.99 V
MDC IDC MSMT LEADCHNL RA PACING THRESHOLD AMPLITUDE: 0.5 V
MDC IDC MSMT LEADCHNL RA PACING THRESHOLD PULSEWIDTH: 0.4 ms
MDC IDC MSMT LEADCHNL RA SENSING INTR AMPL: 0.8 mV
MDC IDC MSMT LEADCHNL RV IMPEDANCE VALUE: 480 Ohm
MDC IDC SET LEADCHNL RV SENSING SENSITIVITY: 5 mV
MDC IDC STAT BRADY AP VP PERCENT: 29 %
MDC IDC STAT BRADY AS VP PERCENT: 70 %
MDC IDC STAT BRADY RA PERCENT PACED: 28 %
Pulse Gen Model: 2240
Pulse Gen Serial Number: 7759868

## 2016-04-23 ENCOUNTER — Encounter: Payer: Self-pay | Admitting: Cardiology

## 2016-04-24 ENCOUNTER — Inpatient Hospital Stay (HOSPITAL_COMMUNITY): Admission: RE | Admit: 2016-04-24 | Payer: Medicare Other | Source: Ambulatory Visit

## 2016-04-30 ENCOUNTER — Encounter (HOSPITAL_COMMUNITY)
Admission: RE | Admit: 2016-04-30 | Discharge: 2016-04-30 | Disposition: A | Discharge status: Home / Self Care | Payer: Medicare Other | Source: Ambulatory Visit | Attending: Nephrology | Admitting: Nephrology

## 2016-04-30 DIAGNOSIS — D631 Anemia in chronic kidney disease: Secondary | ICD-10-CM | POA: Insufficient documentation

## 2016-04-30 DIAGNOSIS — N183 Chronic kidney disease, stage 3 (moderate): Secondary | ICD-10-CM | POA: Insufficient documentation

## 2016-04-30 MED ORDER — DARBEPOETIN ALFA 40 MCG/0.4ML IJ SOSY
PREFILLED_SYRINGE | INTRAMUSCULAR | Status: AC
  Filled 2016-04-30: qty 0.4

## 2016-04-30 MED ORDER — DARBEPOETIN ALFA 40 MCG/0.4ML IJ SOSY
40.0000 ug | PREFILLED_SYRINGE | INTRAMUSCULAR | Status: DC
  Administered 2016-04-30: 40 ug via SUBCUTANEOUS

## 2016-05-11 ENCOUNTER — Encounter (HOSPITAL_COMMUNITY)
Admission: RE | Admit: 2016-05-11 | Discharge: 2016-05-11 | Disposition: A | Discharge status: Home / Self Care | Payer: Medicare Other | Source: Ambulatory Visit | Attending: Nephrology | Admitting: Nephrology

## 2016-05-11 DIAGNOSIS — D631 Anemia in chronic kidney disease: Secondary | ICD-10-CM | POA: Diagnosis not present

## 2016-05-11 MED ORDER — DARBEPOETIN ALFA 40 MCG/0.4ML IJ SOSY
40.0000 ug | PREFILLED_SYRINGE | INTRAMUSCULAR | Status: DC
  Administered 2016-05-11: 40 ug via SUBCUTANEOUS

## 2016-05-11 MED ORDER — DARBEPOETIN ALFA 40 MCG/0.4ML IJ SOSY
PREFILLED_SYRINGE | INTRAMUSCULAR | Status: AC
  Filled 2016-05-11: qty 0.4

## 2016-05-22 ENCOUNTER — Telehealth: Payer: Self-pay | Admitting: Interventional Cardiology

## 2016-05-22 NOTE — Telephone Encounter (Signed)
Spoke with pt daughter as she has been very tired and coughing a lot. Pt has no c/o she feel well but daughter is just worried that the cough is r/t to her heart. Pt does not have any swelling. Pt is able to lay down and rest without complaint at night. Pt encouraged to see her PCP and keep scheduled appt with Dr. Katrinka BlazingSmith on 6/5. Pt verbalized understanding, no additional questions at this time.

## 2016-05-22 NOTE — Telephone Encounter (Signed)
New Message:  Pt's daughter Harriett Sineancy called in wanting to report that her mother has been experiencing some extreme fatigue/sleepiness, weakness in legs/not well balanced, and a congested cough. She would like to speak with a nurse about this and discuss some recommendations. Please f/u

## 2016-05-25 ENCOUNTER — Encounter (HOSPITAL_COMMUNITY)
Admission: RE | Admit: 2016-05-25 | Discharge: 2016-05-25 | Disposition: A | Discharge status: Home / Self Care | Payer: Medicare Other | Source: Ambulatory Visit | Attending: Nephrology | Admitting: Nephrology

## 2016-05-25 ENCOUNTER — Encounter (HOSPITAL_COMMUNITY): Payer: Medicare Other

## 2016-05-25 DIAGNOSIS — D631 Anemia in chronic kidney disease: Secondary | ICD-10-CM | POA: Diagnosis not present

## 2016-05-25 MED ORDER — DARBEPOETIN ALFA 40 MCG/0.4ML IJ SOSY
PREFILLED_SYRINGE | INTRAMUSCULAR | Status: AC
  Filled 2016-05-25: qty 0.4

## 2016-05-25 MED ORDER — DARBEPOETIN ALFA 40 MCG/0.4ML IJ SOSY
40.0000 ug | PREFILLED_SYRINGE | INTRAMUSCULAR | Status: DC
  Administered 2016-05-25: 40 ug via SUBCUTANEOUS

## 2016-05-29 ENCOUNTER — Encounter: Payer: Self-pay | Admitting: Cardiology

## 2016-06-04 ENCOUNTER — Encounter: Payer: Self-pay | Admitting: Interventional Cardiology

## 2016-06-04 ENCOUNTER — Ambulatory Visit (INDEPENDENT_AMBULATORY_CARE_PROVIDER_SITE_OTHER): Payer: Medicare Other | Admitting: Interventional Cardiology

## 2016-06-04 VITALS — BP 130/66 | HR 65 | Ht 59.0 in | Wt 174.0 lb

## 2016-06-04 DIAGNOSIS — I2510 Atherosclerotic heart disease of native coronary artery without angina pectoris: Secondary | ICD-10-CM

## 2016-06-04 DIAGNOSIS — I5032 Chronic diastolic (congestive) heart failure: Secondary | ICD-10-CM | POA: Diagnosis not present

## 2016-06-04 DIAGNOSIS — I10 Essential (primary) hypertension: Secondary | ICD-10-CM | POA: Diagnosis not present

## 2016-06-04 DIAGNOSIS — N185 Chronic kidney disease, stage 5: Secondary | ICD-10-CM | POA: Diagnosis not present

## 2016-06-04 NOTE — Patient Instructions (Signed)
Medication Instructions:  Your physician recommends that you continue on your current medications as directed. Please refer to the Current Medication list given to you today.   Labwork: Please have  Bnp drawn at your Nephrologist office today. Please take the written given with you today to their office  Testing/Procedures: None ordered  Follow-Up: Your physician wants you to follow-up in: 6 months with Dr.Smith You will receive a reminder letter in the mail two months in advance. If you don't receive a letter, please call our office to schedule the follow-up appointment.   Any Other Special Instructions Will Be Listed Below (If Applicable).     If you need a refill on your cardiac medications before your next appointment, please call your pharmacy.

## 2016-06-04 NOTE — Progress Notes (Signed)
Cardiology Office Note    Date:  06/04/2016   ID:  Dorothy Bartlett, DOB [birth year], MRN [id number]  PCP:  Eliott Nine, MD  Cardiologist: Lesleigh Noe, MD   Chief Complaint  Patient presents with  . Coronary Artery Disease    History of Present Illness:  Dorothy Bartlett is a 82 y.o. female is in for follow-up of known multivessel CAD, essential hypertension, chronic combined systolic and diastolic heart failure, permanent pacemaker for high-grade AV block, stage V chronic kidney disease, essential hypertension, and hyperlipidemia.  Exertional dyspnea is progressing. She denies orthopnea. No spontaneous episodes of dyspnea or chest pain. Dyspnea is limiting physical activity. No recent changes in her medical regimen.  Past Medical History  Diagnosis Date  . Hyperlipidemia   . Carotid stenosis     Carotid US (12/2013): < 60% RCA, < 40% LICA.  [name] Hypertension   . Coronary artery disease     a. s/p MI 1995 tx w/ PTCA to Dx, Ramus, ap LAD;  b. LHC (11/2004):  Inferior HK, EF 60%, proximal LAD 50%, ostial D1 80%, mid D1 95%, mid LAD 50-60%, distal LAD at the apex 90%, OM1 occluded (fills late by left to left collaterals), RCA 75%. Medical therapy recommended.;  c. Lexiscan Myoview (12/2013):  Prior ant-lat scar with very small area of peri-infarct ischemia in ant wall, EF 40 (high risk)   . Obesity (BMI 30-39.9)   . Intrinsic asthma 01/28/2008     Mild intermittant asthma, PFT's 01/28/08     . Old anterolateral myocardial infarction   . PVD (peripheral vascular disease) (HCC) 02/16/2014    2015, right femoral peroneal bypass grafting and left femoral-tibial bypass grafting by Dr. Arbie Cookey for rest pain as well as nonhealing ulcer on toe accompanied with toe amputation  Arteriogram October 2015 with patent graft on the right, 70% stenosis of graft on the left proximally.   . Chronic combined systolic and diastolic heart failure (HCC) 04/13/2014  . Complete heart block (HCC)     a.  05/2015 s/p [facility] Assurity DR model [phone] dual chamber PPM (serial number [id number]) (Dr. Johney Frame).  [name] Hx of echocardiogram     a. Echo 5/16: dist septal, dist ant, dist inf and apical AK, EF 40-45%, mod MR, mild LAE, PASP 72  . Chronic kidney disease (CKD), stage V (HCC)     notes 07/03/2015; Dr. Hyman Hopes  . 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 Type 2 diabetes mellitus with renal manifestations (HCC)   . Presence of permanent cardiac pacemaker     Past Surgical History  Procedure Laterality Date  . Repair of nerve in left arm  Left 2000  . Cardiac catheterization  1995    w/ PTCA Diag 2nd MI  . Femoral-tibial bypass graft Left 02/24/2014    Procedure: BYPASS GRAFT FEMORAL-TIBIAL ARTERY;  Surgeon: Larina Earthly, MD;  Location: [facility] OR;  Service: Vascular;  Laterality: Left;  . Femoral-popliteal bypass graft Right 08/04/2014    Procedure: RIGHT FEMORAL-PERONEAL ARTERY BYPASS GRAFT;  Surgeon: Larina Earthly, MD;  Location: [facility] OR;  Service: Vascular;  Laterality: Right;  . Amputation Right 08/04/2014    Procedure: AMPUTATION DIGIT-RIGHT 3RD TOE;  Surgeon: Larina Earthly, MD;  Location: [facility] OR;  Service: Vascular;  Laterality: Right;  . Lower extremity angiogram Left 01/08/2014    Procedure: LOWER EXTREMITY ANGIOGRAM;  Surgeon: Sherren Kerns, MD;  Location: [facility] CATH LAB;  Service:  Cardiovascular;  Laterality: Left;  . Abdominal angiogram  01/08/2014    Procedure: ABDOMINAL ANGIOGRAM;  Surgeon: Sherren Kernsharles E Fields, MD;  [facility];  Service: Cardiovascular;;  . Lower extremity angiogram Right 08/03/2014    Procedure: LOWER EXTREMITY ANGIOGRAM;  Surgeon: Nada LibmanVance W Brabham, MD;  [facility];  Service: Cardiovascular;  Laterality: Right;  . Abdominal aortagram N/A 10/25/2014    Procedure: ABDOMINAL Ronny FlurryAORTAGRAM;  Surgeon: Chuck Hinthristopher S Dickson, MD;  [facility];  Service: Cardiovascular;  Laterality: N/A;  . Ep implantable device N/A 05/10/2015     Procedure: Pacemaker Implant;  Surgeon: Hillis RangeJames Allred, MD;  [facility];  Service: Cardiovascular;  Laterality: N/A;  . Ep implantable device N/A 06/13/2015    Procedure: Lead Revision/Repair;  Surgeon: Duke SalviaSteven C Klein, MD;  [facility];  Service: Cardiovascular;  Laterality: N/A;  . Pacemaker lead removal  06/13/2015    Procedure: Pacemaker Lead Removal;  Surgeon: Duke SalviaSteven C Klein, MD;  [facility];  Service: Cardiovascular;;  . Insert / replace / remove pacemaker      Current Medications: Outpatient Prescriptions Prior to Visit  Medication Sig Dispense Refill  . albuterol (PROVENTIL HFA;VENTOLIN HFA) 108 (90 BASE) MCG/ACT inhaler Inhale 2 puffs into the lungs every 6 (six) hours as needed for wheezing or shortness of breath.    [name]. aspirin 81 MG tablet Take 81 mg by mouth every morning.     [name]. atorvastatin (LIPITOR) 40 MG tablet Take 40 mg by mouth every morning.     . B Complex-C-Folic Acid (RENA-VITE RX) 1 MG TABS Take 1 tablet by mouth every morning.     . carvedilol (COREG) 12.5 MG tablet Take 1 tablet (12.5 mg total) by mouth 2 (two) times daily. 180 tablet 1  . docusate sodium (COLACE) 100 MG capsule Take 100 mg by mouth 2 (two) times daily.     . furosemide (LASIX) 40 MG tablet Take 1 tablet (40 mg total) by mouth 2 (two) times daily. 180 tablet 3  . hydrALAZINE (APRESOLINE) 10 MG tablet Take 1 tablet (10 mg total) by mouth 3 (three) times daily. 270 tablet 3  . insulin lispro (HUMALOG) 100 UNIT/ML injection Inject 5 Units into the skin 2 (two) times daily as needed for high blood sugar. Only takes if CBG >250.    [name]. isosorbide mononitrate (IMDUR) 30 MG 24 hr tablet Take 30 mg by mouth daily.    [name]. JANUVIA 25 MG tablet Take 25 mg by mouth daily.    [name]. loratadine (CLARITIN) 10 MG tablet Take 10 mg by mouth daily as needed for allergies.    . Melatonin 3 MG TABS Take 3 mg by mouth at bedtime as needed (for sleep).     . nitroGLYCERIN  (NITROSTAT) 0.4 MG SL tablet Place 1 tablet (0.4 mg total) under the tongue every 5 (five) minutes x 3 doses as needed for chest pain. 25 tablet 3  . paricalcitol (ZEMPLAR) 1 MCG capsule Take 1 mcg by mouth daily.     . Vitamin D, Ergocalciferol, (DRISDOL) 50000 UNITS CAPS capsule Take 1 capsule (50,000 Units total) by mouth every 7 (seven) days. 10 capsule 0   No facility-administered medications prior to visit.     Allergies:   Review of patient's allergies indicates no known allergies.   Social History   Social History  . Marital Status: Widowed    Spouse Name: N/A  . Number of Children: N/A  .  Years of Education: N/A   Occupational History  . Retired    Social History Main Topics  . Smoking status: Never Smoker   . Smokeless tobacco: Never Used  . Alcohol Use: No  . Drug Use: No  . Sexual Activity: No   Other Topics Concern  . None   Social History Narrative   Tobacco use cigarettes: Former Smoker   Occupation: Unemployed, retired   Marital Status: Single   Children: 2 daughters     Family History:  The patient's family history includes Diabetes in her father; Hyperlipidemia in her daughter and son; Hypertension in her daughter, father, and sister.   ROS:   Please see the history of present illness.    Difficulty with balance. Shortness of breath. Dense right cataract decreasing vision. Pacemaker check is due 06/19/2016 All other systems reviewed and are negative.   PHYSICAL EXAM:   VS:  BP 130/66 mmHg  Pulse 65  Ht  (1.499 m)  Wt 174 lb (78.926 kg)  BMI 35.13 kg/m2   GEN: Well nourished, well developed, in no acute distress HEENT: normal Neck: no JVD, carotid bruits, or masses Cardiac: RRR; no murmurs, rubs, or gallops. There is bilateral trace to 1+ edema  Respiratory:  clear to auscultation bilaterally, normal work of breathing GI: soft, nontender, nondistended, + BS MS: no deformity or atrophy Skin: warm and dry, no rash Neuro:  Alert and  Oriented x 3, Strength and sensation are intact Psych: euthymic mood, full affect  Wt Readings from Last 3 Encounters:  06/04/16 174 lb (78.926 kg)  11/09/15 176 lb 12.8 oz (80.196 kg)  09/20/15 171 lb (77.565 kg)      Studies/Labs Reviewed:   EKG:  EKG  No new data is obtained today  Recent Labs: 06/12/2015: Magnesium 2.2 07/03/2015: B Natriuretic Peptide 2462.5* 07/04/2015: ALT 43 07/05/2015: BUN 60*; Creatinine, Ser 3.39*; Hemoglobin 8.7*; Platelets 168; Potassium 4.1; Sodium 139   Lipid Panel No results found for: CHOL, TRIG, HDL, CHOLHDL, VLDL, LDLCALC, LDLDIRECT  Additional studies/ records that were reviewed today include:  No recent new data. Awaiting laboratory data from [facility].    ASSESSMENT:    1. Atherosclerosis of native coronary artery of native heart without angina pectoris   2. CKD stage 5, GFR less than 15 ml/min   3. Essential hypertension   4. Chronic diastolic heart failure (HCC)      PLAN:  In order of problems listed above:  1. Denies angina. Continue risk factor modification. 2. Followed by Dr. Elvis Coil. Still making good urine according to the patient. 3. The blood pressures well controlled. Low salt diet is recommended.  4.  Dyspnea is concerning. There is peripheral edema. Rule out volume overload. We will check BNP today with her other laboratory work that is being done at [facility].    Medication Adjustments/Labs and Tests Ordered: Current medicines are reviewed at length with the patient today.  Concerns regarding medicines are outlined above.  Medication changes, Labs and Tests ordered today are listed in the Patient Instructions below. There are no Patient Instructions on file for this visit.   Signed, Lesleigh Noe, MD  06/04/2016 9:13 AM    [facility] [address] [place], [place], [place]  [address] Phone: [phone]; Fax: [phone]

## 2016-06-07 ENCOUNTER — Ambulatory Visit: Payer: Medicare Other | Admitting: Podiatry

## 2016-06-08 ENCOUNTER — Encounter (HOSPITAL_COMMUNITY)
Admission: RE | Admit: 2016-06-08 | Discharge: 2016-06-08 | Disposition: A | Discharge status: Home / Self Care | Payer: Medicare Other | Source: Ambulatory Visit | Attending: Nephrology | Admitting: Nephrology

## 2016-06-08 DIAGNOSIS — N183 Chronic kidney disease, stage 3 (moderate): Secondary | ICD-10-CM | POA: Diagnosis not present

## 2016-06-08 DIAGNOSIS — D631 Anemia in chronic kidney disease: Secondary | ICD-10-CM | POA: Insufficient documentation

## 2016-06-08 MED ORDER — DARBEPOETIN ALFA 40 MCG/0.4ML IJ SOSY
40.0000 ug | PREFILLED_SYRINGE | INTRAMUSCULAR | Status: DC
  Administered 2016-06-08: 40 ug via SUBCUTANEOUS

## 2016-06-08 MED ORDER — DARBEPOETIN ALFA 40 MCG/0.4ML IJ SOSY
PREFILLED_SYRINGE | INTRAMUSCULAR | Status: AC
  Filled 2016-06-08: qty 0.4

## 2016-06-14 ENCOUNTER — Telehealth: Payer: Self-pay | Admitting: Interventional Cardiology

## 2016-06-14 ENCOUNTER — Encounter: Payer: Self-pay | Admitting: Podiatry

## 2016-06-14 ENCOUNTER — Ambulatory Visit (INDEPENDENT_AMBULATORY_CARE_PROVIDER_SITE_OTHER): Payer: Medicare Other | Admitting: Podiatry

## 2016-06-14 VITALS — BP 128/60 | HR 60

## 2016-06-14 DIAGNOSIS — B351 Tinea unguium: Secondary | ICD-10-CM

## 2016-06-14 DIAGNOSIS — M79606 Pain in leg, unspecified: Secondary | ICD-10-CM | POA: Diagnosis not present

## 2016-06-14 NOTE — Progress Notes (Signed)
Subjective:  82 year old female presents complaining of painful nails. She is accompanied by her daughter via wheel chair, requesting toe nails trimmed.  Patient has no other complaints.  Objective: Vascular: Pedal pulses are not palpable.  Positive of forefoot edema bilateral. Dermatologic: Discolored and hypertrophic nails x 9. Neurologic: All epicritic sensations grossly intact with hyperalgic foot left.  Orthopedic: Status post removal of 3rd digit right. Severe STJ pronation with flattening arch upon weight bearing.   Assessment:  Deformed mycotic nails x 9. Painful nails with ambulation.   Plan:  All nails debrided. Return in 3 month or as needed

## 2016-06-14 NOTE — Telephone Encounter (Signed)
Returned call to [place] pt daughter. Adv her that we have not received copies of the pt's labs from [facility]. Provide her our fax # she will call their office to ask them to refax them.

## 2016-06-14 NOTE — Telephone Encounter (Signed)
New message    Calling to see if we received lab results from the kidney center and if yes, what were the results?

## 2016-06-14 NOTE — Patient Instructions (Signed)
Seen for hypertrophic nails. All nails debrided. Return in 3 months or as needed.  

## 2016-06-15 ENCOUNTER — Telehealth: Payer: Self-pay | Admitting: Internal Medicine

## 2016-06-15 ENCOUNTER — Telehealth: Payer: Self-pay | Admitting: Interventional Cardiology

## 2016-06-15 ENCOUNTER — Encounter: Payer: Self-pay | Admitting: Interventional Cardiology

## 2016-06-15 DIAGNOSIS — I10 Essential (primary) hypertension: Secondary | ICD-10-CM

## 2016-06-15 DIAGNOSIS — Z79899 Other long term (current) drug therapy: Secondary | ICD-10-CM

## 2016-06-15 NOTE — Telephone Encounter (Signed)
Wrong provider

## 2016-06-15 NOTE — Telephone Encounter (Signed)
Spoke with pt dtr, she talked to [facility] today and they were faxing the lab results over. Explained do not see those results as of now in dr smith's cubby. Will ask triage to check next week for labs since lisa is on vacation.

## 2016-06-15 NOTE — Telephone Encounter (Signed)
New Message:  Pt's daughter called in stating that the patient had some labs done at [facility] and she would like to know if Dr. Katrinka BlazingSmith received them. Please f/u with the patient' s daughter.

## 2016-06-19 ENCOUNTER — Encounter: Payer: Self-pay | Admitting: Internal Medicine

## 2016-06-19 ENCOUNTER — Ambulatory Visit (INDEPENDENT_AMBULATORY_CARE_PROVIDER_SITE_OTHER): Payer: Medicare Other | Admitting: Internal Medicine

## 2016-06-19 ENCOUNTER — Encounter: Payer: Self-pay | Admitting: Interventional Cardiology

## 2016-06-19 VITALS — BP 132/71 | HR 59 | Ht 59.0 in | Wt 173.2 lb

## 2016-06-19 DIAGNOSIS — I495 Sick sinus syndrome: Secondary | ICD-10-CM

## 2016-06-19 DIAGNOSIS — Z950 Presence of cardiac pacemaker: Secondary | ICD-10-CM

## 2016-06-19 DIAGNOSIS — I442 Atrioventricular block, complete: Secondary | ICD-10-CM

## 2016-06-19 DIAGNOSIS — I5030 Unspecified diastolic (congestive) heart failure: Secondary | ICD-10-CM

## 2016-06-19 LAB — CUP PACEART INCLINIC DEVICE CHECK
Battery Remaining Longevity: 127.2
Brady Statistic RA Percent Paced: 35 %
Brady Statistic RV Percent Paced: 99.31 %
Implantable Lead Implant Date: 20160510
Implantable Lead Location: 753859
Implantable Lead Location: 753860
Lead Channel Impedance Value: 437.5 Ohm
Lead Channel Impedance Value: 462.5 Ohm
Lead Channel Pacing Threshold Amplitude: 0.625 V
Lead Channel Pacing Threshold Amplitude: 1 V
Lead Channel Sensing Intrinsic Amplitude: 1.5 mV
Lead Channel Setting Pacing Pulse Width: 0.5 ms
MDC IDC LEAD IMPLANT DT: 20160510
MDC IDC MSMT BATTERY VOLTAGE: 3.01 V
MDC IDC MSMT LEADCHNL RA PACING THRESHOLD PULSEWIDTH: 0.4 ms
MDC IDC MSMT LEADCHNL RV PACING THRESHOLD PULSEWIDTH: 0.5 ms
MDC IDC PG SERIAL: 7759868
MDC IDC SESS DTM: 20170620170334
MDC IDC SET LEADCHNL RA PACING AMPLITUDE: 1.75 V
MDC IDC SET LEADCHNL RV PACING AMPLITUDE: 0.875
MDC IDC SET LEADCHNL RV SENSING SENSITIVITY: 5 mV

## 2016-06-19 NOTE — Patient Instructions (Signed)
Medication Instructions: - Your physician recommends that you continue on your current medications as directed. Please refer to the Current Medication list given to you today.  Labwork: - none  Procedures/Testing: -none  Follow-Up: - Remote monitoring is used to monitor your Pacemaker of ICD from home. This monitoring reduces the number of office visits required to check your device to one time per year. It allows us to keep an eye on the functioning of your device to ensure it is working properly. You are scheduled for a device check from home on 09/18/16. You may send your transmission at any time that day. If you have a wireless device, the transmission will be sent automatically. After your physician reviews your transmission, you will receive a postcard with your next transmission date.  - Your physician wants you to follow-up in: 1 year with Dr. Klein. You will receive a reminder letter in the mail two months in advance. If you don't receive a letter, please call our office to schedule the follow-up appointment.  Any Additional Special Instructions Will Be Listed Below (If Applicable).     If you need a refill on your cardiac medications before your next appointment, please call your pharmacy.   

## 2016-06-19 NOTE — Progress Notes (Signed)
Patient Care Team: Eliott NineJennifer League-Sobon, MD as PCP - General (Family Medicine) Elvis CoilMartin Webb, MD as Consulting Physician (Nephrology) Lyn RecordsHenry W Smith, MD as Consulting Physician (Cardiology) Hillis RangeJames Allred, MD as Consulting Physician (Clinical Cardiac Electrophysiology)   HPI  Dorothy Bartlett is a 82 y.o. female Seen following pacemaker revision 6/16. She had presented with a 5/16 with complete heart block. Her newly implanted device had loss of RV capture. She underwent lead reimplantation I found myself unable to remove the RV lead somewhat surprisingly. I ended up E.  She has a history of prior MI 1995 treated with PCI. Catheterization 2005 demonstrated inferior wall hypokinesis and ejection fraction of 60% she had modest 3 vessel disease. A Myoview scan undertaken 1/15 was interpreted as high risk with an area of inferolateral scar and peri-infarct ischemia. She has significant renal dysfunction and previously had refused catheterization.  The patient denies chest pain,  nocturnal dyspnea, orthopnea; she has recently had more peripheral edema.  There have been no palpitations, lightheadedness or syncope. She gets around slowly but this is associated with some dyspnea.  She saw Dr. Katrinka BlazingSmith recently who was impressed also by her edema; he arranged for blood work to be drawn by [facility]. The family has not heard back as to what to do next.    Records and Results Reviewed   Past Medical History  Diagnosis Date  . Hyperlipidemia   . Carotid stenosis     Carotid US (12/2013): < 60% RCA, < 40% LICA.  [name]. Hypertension   . Coronary artery disease     a. s/p MI 1995 tx w/ PTCA to Dx, Ramus, ap LAD;  b. LHC (11/2004):  Inferior HK, EF 60%, proximal LAD 50%, ostial D1 80%, mid D1 95%, mid LAD 50-60%, distal LAD at the apex 90%, OM1 occluded (fills late by left to left collaterals), RCA 75%. Medical therapy recommended.;  c. Lexiscan Myoview (12/2013):  Prior ant-lat scar with very small  area of peri-infarct ischemia in ant wall, EF 40 (high risk)   . Obesity (BMI 30-39.9)   . Intrinsic asthma 01/28/2008     Mild intermittant asthma, PFT's 01/28/08     . Old anterolateral myocardial infarction   . PVD (peripheral vascular disease) (HCC) 02/16/2014    2015, right femoral peroneal bypass grafting and left femoral-tibial bypass grafting by Dr. Arbie CookeyEarly for rest pain as well as nonhealing ulcer on toe accompanied with toe amputation  Arteriogram October 2015 with patent graft on the right, 70% stenosis of graft on the left proximally.   . Chronic combined systolic and diastolic heart failure (HCC) 04/13/2014  . Complete heart block (HCC)     a. 05/2015 s/p [employer] DR model [id number] dual chamber PPM (serial number [id number]) (Dr. Johney FrameAllred).  [name]. Hx of echocardiogram     a. Echo 5/16: dist septal, dist ant, dist inf and apical AK, EF 40-45%, mod MR, mild LAE, PASP 72  . Chronic kidney disease (CKD), stage V (HCC)     notes 07/03/2015; Dr. Hyman HopesWebb  . 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 Type 2 diabetes mellitus with renal manifestations (HCC)   . Presence of permanent cardiac pacemaker     Past Surgical History  Procedure Laterality Date  . Repair of nerve in left arm  Left 2000  . Cardiac catheterization  1995    w/ PTCA Diag 2nd MI  . Femoral-tibial bypass graft Left 02/24/2014  Procedure: BYPASS GRAFT FEMORAL-TIBIAL ARTERY;  Surgeon: Larina Earthly, MD;  Location: [facility] OR;  Service: Vascular;  Laterality: Left;  . Femoral-popliteal bypass graft Right 08/04/2014    Procedure: RIGHT FEMORAL-PERONEAL ARTERY BYPASS GRAFT;  Surgeon: Larina Earthly, MD;  Location: [facility] OR;  Service: Vascular;  Laterality: Right;  . Amputation Right 08/04/2014    Procedure: AMPUTATION DIGIT-RIGHT 3RD TOE;  Surgeon: Larina Earthly, MD;  Location: [facility] OR;  Service: Vascular;  Laterality: Right;  . Lower extremity angiogram Left 01/08/2014    Procedure: LOWER EXTREMITY  ANGIOGRAM;  Surgeon: Sherren Kerns, MD;  Location: [facility] CATH LAB;  Service: Cardiovascular;  Laterality: Left;  . Abdominal angiogram  01/08/2014    Procedure: ABDOMINAL ANGIOGRAM;  Surgeon: Sherren Kerns, MD;  Location: [facility] CATH LAB;  Service: Cardiovascular;;  . Lower extremity angiogram Right 08/03/2014    Procedure: LOWER EXTREMITY ANGIOGRAM;  Surgeon: Nada Libman, MD;  Location: [facility] CATH LAB;  Service: Cardiovascular;  Laterality: Right;  . Abdominal aortagram N/A 10/25/2014    Procedure: ABDOMINAL Ronny Flurry;  Surgeon: Chuck Hint, MD;  Location: [facility] CATH LAB;  Service: Cardiovascular;  Laterality: N/A;  . Ep implantable device N/A 05/10/2015    Procedure: Pacemaker Implant;  Surgeon: Hillis Range, MD;  Location: [facility] INVASIVE CV LAB;  Service: Cardiovascular;  Laterality: N/A;  . Ep implantable device N/A 06/13/2015    Procedure: Lead Revision/Repair;  Surgeon: Duke Salvia, MD;  Location: [facility] INVASIVE CV LAB;  Service: Cardiovascular;  Laterality: N/A;  . Pacemaker lead removal  06/13/2015    Procedure: Pacemaker Lead Removal;  Surgeon: Duke Salvia, MD;  Location: [facility] INVASIVE CV LAB;  Service: Cardiovascular;;  . Insert / replace / remove pacemaker      Current Outpatient Prescriptions  Medication Sig Dispense Refill  . albuterol (PROVENTIL HFA;VENTOLIN HFA) 108 (90 BASE) MCG/ACT inhaler Inhale 2 puffs into the lungs every 6 (six) hours as needed for wheezing or shortness of breath.    [name] aspirin 81 MG tablet Take 81 mg by mouth every morning.     [name] atorvastatin (LIPITOR) 40 MG tablet Take 40 mg by mouth every morning.     . B Complex-C-Folic Acid (RENA-VITE RX) 1 MG TABS Take 1 tablet by mouth every morning.     . carvedilol (COREG) 12.5 MG tablet Take 1 tablet (12.5 mg total) by mouth 2 (two) times daily. 180 tablet 1  . docusate sodium (COLACE) 100 MG capsule Take 100 mg by mouth 2 (two) times daily.     . furosemide (LASIX) 40 MG tablet Take 1 tablet (40 mg total) by mouth  2 (two) times daily. 180 tablet 3  . hydrALAZINE (APRESOLINE) 10 MG tablet Take 1 tablet (10 mg total) by mouth 3 (three) times daily. 270 tablet 3  . insulin lispro (HUMALOG) 100 UNIT/ML injection Inject 5 Units into the skin 2 (two) times daily as needed for high blood sugar. Only takes if CBG >250.    [name] isosorbide mononitrate (IMDUR) 30 MG 24 hr tablet Take 30 mg by mouth daily.    [name] JANUVIA 25 MG tablet Take 25 mg by mouth daily.    [name] loratadine (CLARITIN) 10 MG tablet Take 10 mg by mouth daily as needed for allergies.    . Melatonin 3 MG TABS Take 3 mg by mouth at bedtime as needed (for sleep).     . nitroGLYCERIN (NITROSTAT) 0.4 MG SL tablet Place 1 tablet (0.4 mg total) under the  tongue every 5 (five) minutes x 3 doses as needed for chest pain. 25 tablet 3  . paricalcitol (ZEMPLAR) 1 MCG capsule Take 1 mcg by mouth daily.     . Vitamin D, Ergocalciferol, (DRISDOL) 50000 UNITS CAPS capsule Take 1 capsule (50,000 Units total) by mouth every 7 (seven) days. 10 capsule 0   No current facility-administered medications for this visit.    No Known Allergies    Review of Systems negative except from HPI and PMH  Physical Exam BP 132/71 mmHg  Pulse 59  Ht 4\' 11"  (1.499 m)  Wt 173 lb 3.2 oz (78.563 kg)  BMI 34.96 kg/m2  SpO2 95% Well developed and well nourished in no acute distress sitting in a wheel chair HENT normal E scleral and icterus clear Neck Supple JVP flat; carotids brisk and full Clear to ausculation Device pocket well healed; without hematoma or erythema.  There is no tethering  Device pocket well healed; without hematoma or erythema.  There is no tethering Regular rate and rhythm, no murmurs gallops or rub Soft with active bowel sounds No clubbing cyanosis 2  + R  Edema Alert and oriented, grossly normal motor and sensory function Skin Warm and Dry  ECG was ordered today demonstrated AV pacing   Assessment and  Plan  Complete heart block  Sinus node  dysfunction  Pacemaker-dual-chamber-[employer] Jude  The patient's device was interrogated and the information was fully reviewed.  The device was reprogrammed to maximize longevity  HFpEF  Hypertension  Coronary artery disease   Without symptoms of ischemia  BP well controlled  Continue current meds. I was in touch with Dr. Katrinka Blazing who will follow-up with Dr. Hyman Hopes as to the response to her most recent labs.

## 2016-06-20 NOTE — Telephone Encounter (Signed)
Left message for patient's daughter Harriett Sineancy that lab work has not been received and advised her to call [facility] and request it to be faxed to [phone].

## 2016-06-20 NOTE — Telephone Encounter (Signed)
Lab work has not been received as of this time

## 2016-06-21 ENCOUNTER — Other Ambulatory Visit (HOSPITAL_COMMUNITY): Payer: Self-pay | Admitting: *Deleted

## 2016-06-22 ENCOUNTER — Ambulatory Visit (HOSPITAL_COMMUNITY)
Admission: RE | Admit: 2016-06-22 | Discharge: 2016-06-22 | Disposition: A | Discharge status: Home / Self Care | Payer: Medicare Other | Source: Ambulatory Visit | Attending: Nephrology | Admitting: Nephrology

## 2016-06-22 ENCOUNTER — Encounter (HOSPITAL_COMMUNITY): Payer: Medicare Other

## 2016-06-22 DIAGNOSIS — N183 Chronic kidney disease, stage 3 (moderate): Secondary | ICD-10-CM | POA: Insufficient documentation

## 2016-06-22 DIAGNOSIS — Z5181 Encounter for therapeutic drug level monitoring: Secondary | ICD-10-CM | POA: Diagnosis not present

## 2016-06-22 DIAGNOSIS — Z79899 Other long term (current) drug therapy: Secondary | ICD-10-CM | POA: Diagnosis not present

## 2016-06-22 DIAGNOSIS — D631 Anemia in chronic kidney disease: Secondary | ICD-10-CM | POA: Insufficient documentation

## 2016-06-22 DIAGNOSIS — D509 Iron deficiency anemia, unspecified: Secondary | ICD-10-CM | POA: Insufficient documentation

## 2016-06-22 MED ORDER — DARBEPOETIN ALFA 40 MCG/0.4ML IJ SOSY
PREFILLED_SYRINGE | INTRAMUSCULAR | Status: AC
  Filled 2016-06-22: qty 0.4

## 2016-06-22 MED ORDER — SODIUM CHLORIDE 0.9 % IV SOLN
510.0000 mg | INTRAVENOUS | Status: DC
  Administered 2016-06-22: 510 mg via INTRAVENOUS
  Filled 2016-06-22: qty 17

## 2016-06-22 MED ORDER — DARBEPOETIN ALFA 40 MCG/0.4ML IJ SOSY
40.0000 ug | PREFILLED_SYRINGE | INTRAMUSCULAR | Status: DC
  Administered 2016-06-22: 40 ug via SUBCUTANEOUS

## 2016-06-22 NOTE — Telephone Encounter (Signed)
Spoke with daughter who had instructions to contact Dr Marland McalpineWebb's office about medication changes and further lab work.  She is reporting Dr Hyman HopesWebb is out of the office (and country) until next Tuesday or Wednesday.  Pt is retaining fluid but will remain on medications as listed for now.  Will forward to Dr Katrinka BlazingSmith to see if he wants to make any changes prior to Dr Marland McalpineWebb's return due to pt's increased fluid volumne

## 2016-06-22 NOTE — Telephone Encounter (Signed)
Follow up      Returning a call to the nurse.  Did we get the labs faxed from [facility]?

## 2016-06-24 NOTE — Telephone Encounter (Signed)
Increase furosemide to 80 gm BID. BMET 1 week.

## 2016-06-25 ENCOUNTER — Telehealth: Payer: Self-pay | Admitting: Interventional Cardiology

## 2016-06-25 NOTE — Telephone Encounter (Signed)
Spoke with Harriett SineNancy who states understanding of orders to increase Furosemide to 80 mg BID for 1 week and then have blood work.  She will bring her in for lab 7/3.

## 2016-06-25 NOTE — Telephone Encounter (Signed)
Left message for Harriett Sineancy to call back for orders.

## 2016-06-25 NOTE — Telephone Encounter (Signed)
Follow-up ° ° °Returning the nurses phone call °

## 2016-06-25 NOTE — Telephone Encounter (Signed)
Please call telephone note from 06/15/2016

## 2016-06-29 ENCOUNTER — Emergency Department (HOSPITAL_COMMUNITY): Payer: Medicare Other

## 2016-06-29 ENCOUNTER — Ambulatory Visit (HOSPITAL_COMMUNITY)
Admission: RE | Admit: 2016-06-29 | Discharge: 2016-06-29 | Disposition: A | Discharge status: Home / Self Care | Payer: Medicare Other | Source: Ambulatory Visit | Attending: Nephrology | Admitting: Nephrology

## 2016-06-29 ENCOUNTER — Encounter (HOSPITAL_COMMUNITY): Payer: Self-pay

## 2016-06-29 ENCOUNTER — Inpatient Hospital Stay (HOSPITAL_COMMUNITY)
Admission: EM | Admit: 2016-06-29 | Discharge: 2016-07-04 | DRG: 064 | Disposition: A | Discharge status: Skilled Nursing Facility | Payer: Medicare Other | Source: Intra-hospital | Attending: Internal Medicine | Admitting: Internal Medicine

## 2016-06-29 ENCOUNTER — Other Ambulatory Visit: Payer: Self-pay

## 2016-06-29 DIAGNOSIS — I5043 Acute on chronic combined systolic (congestive) and diastolic (congestive) heart failure: Secondary | ICD-10-CM | POA: Diagnosis present

## 2016-06-29 DIAGNOSIS — Z7982 Long term (current) use of aspirin: Secondary | ICD-10-CM

## 2016-06-29 DIAGNOSIS — E785 Hyperlipidemia, unspecified: Secondary | ICD-10-CM | POA: Diagnosis present

## 2016-06-29 DIAGNOSIS — R471 Dysarthria and anarthria: Secondary | ICD-10-CM | POA: Diagnosis present

## 2016-06-29 DIAGNOSIS — J45909 Unspecified asthma, uncomplicated: Secondary | ICD-10-CM | POA: Diagnosis present

## 2016-06-29 DIAGNOSIS — I700 Atherosclerosis of aorta: Secondary | ICD-10-CM | POA: Diagnosis present

## 2016-06-29 DIAGNOSIS — N179 Acute kidney failure, unspecified: Secondary | ICD-10-CM | POA: Diagnosis present

## 2016-06-29 DIAGNOSIS — I272 Other secondary pulmonary hypertension: Secondary | ICD-10-CM | POA: Diagnosis present

## 2016-06-29 DIAGNOSIS — D631 Anemia in chronic kidney disease: Secondary | ICD-10-CM | POA: Diagnosis present

## 2016-06-29 DIAGNOSIS — I10 Essential (primary) hypertension: Secondary | ICD-10-CM | POA: Diagnosis not present

## 2016-06-29 DIAGNOSIS — R29810 Facial weakness: Secondary | ICD-10-CM | POA: Diagnosis present

## 2016-06-29 DIAGNOSIS — Z6834 Body mass index (BMI) 34.0-34.9, adult: Secondary | ICD-10-CM | POA: Diagnosis not present

## 2016-06-29 DIAGNOSIS — I132 Hypertensive heart and chronic kidney disease with heart failure and with stage 5 chronic kidney disease, or end stage renal disease: Secondary | ICD-10-CM | POA: Diagnosis present

## 2016-06-29 DIAGNOSIS — I5042 Chronic combined systolic (congestive) and diastolic (congestive) heart failure: Secondary | ICD-10-CM | POA: Diagnosis present

## 2016-06-29 DIAGNOSIS — I2510 Atherosclerotic heart disease of native coronary artery without angina pectoris: Secondary | ICD-10-CM | POA: Diagnosis present

## 2016-06-29 DIAGNOSIS — Z79899 Other long term (current) drug therapy: Secondary | ICD-10-CM

## 2016-06-29 DIAGNOSIS — Z9861 Coronary angioplasty status: Secondary | ICD-10-CM

## 2016-06-29 DIAGNOSIS — E1129 Type 2 diabetes mellitus with other diabetic kidney complication: Secondary | ICD-10-CM | POA: Diagnosis not present

## 2016-06-29 DIAGNOSIS — I672 Cerebral atherosclerosis: Secondary | ICD-10-CM | POA: Diagnosis present

## 2016-06-29 DIAGNOSIS — I6789 Other cerebrovascular disease: Secondary | ICD-10-CM | POA: Diagnosis not present

## 2016-06-29 DIAGNOSIS — R4701 Aphasia: Secondary | ICD-10-CM | POA: Diagnosis present

## 2016-06-29 DIAGNOSIS — I639 Cerebral infarction, unspecified: Principal | ICD-10-CM | POA: Diagnosis present

## 2016-06-29 DIAGNOSIS — I6521 Occlusion and stenosis of right carotid artery: Secondary | ICD-10-CM | POA: Diagnosis present

## 2016-06-29 DIAGNOSIS — I69391 Dysphagia following cerebral infarction: Secondary | ICD-10-CM | POA: Diagnosis not present

## 2016-06-29 DIAGNOSIS — N186 End stage renal disease: Secondary | ICD-10-CM | POA: Diagnosis present

## 2016-06-29 DIAGNOSIS — J45901 Unspecified asthma with (acute) exacerbation: Secondary | ICD-10-CM

## 2016-06-29 DIAGNOSIS — R1310 Dysphagia, unspecified: Secondary | ICD-10-CM | POA: Diagnosis present

## 2016-06-29 DIAGNOSIS — I252 Old myocardial infarction: Secondary | ICD-10-CM | POA: Diagnosis not present

## 2016-06-29 DIAGNOSIS — I340 Nonrheumatic mitral (valve) insufficiency: Secondary | ICD-10-CM | POA: Diagnosis present

## 2016-06-29 DIAGNOSIS — D509 Iron deficiency anemia, unspecified: Secondary | ICD-10-CM

## 2016-06-29 DIAGNOSIS — E1122 Type 2 diabetes mellitus with diabetic chronic kidney disease: Secondary | ICD-10-CM | POA: Diagnosis present

## 2016-06-29 DIAGNOSIS — J9601 Acute respiratory failure with hypoxia: Secondary | ICD-10-CM | POA: Diagnosis present

## 2016-06-29 DIAGNOSIS — J4520 Mild intermittent asthma, uncomplicated: Secondary | ICD-10-CM | POA: Diagnosis present

## 2016-06-29 DIAGNOSIS — I480 Paroxysmal atrial fibrillation: Secondary | ICD-10-CM | POA: Diagnosis present

## 2016-06-29 DIAGNOSIS — E1151 Type 2 diabetes mellitus with diabetic peripheral angiopathy without gangrene: Secondary | ICD-10-CM | POA: Diagnosis present

## 2016-06-29 DIAGNOSIS — Z89421 Acquired absence of other right toe(s): Secondary | ICD-10-CM

## 2016-06-29 DIAGNOSIS — Z950 Presence of cardiac pacemaker: Secondary | ICD-10-CM

## 2016-06-29 DIAGNOSIS — E669 Obesity, unspecified: Secondary | ICD-10-CM | POA: Diagnosis present

## 2016-06-29 DIAGNOSIS — G8191 Hemiplegia, unspecified affecting right dominant side: Secondary | ICD-10-CM | POA: Diagnosis present

## 2016-06-29 DIAGNOSIS — R531 Weakness: Secondary | ICD-10-CM

## 2016-06-29 DIAGNOSIS — N185 Chronic kidney disease, stage 5: Secondary | ICD-10-CM | POA: Insufficient documentation

## 2016-06-29 HISTORY — DX: Cerebral infarction, unspecified: I63.9

## 2016-06-29 LAB — CBC
HEMATOCRIT: 32 % — AB (ref 36.0–46.0)
HEMOGLOBIN: 10 g/dL — AB (ref 12.0–15.0)
MCH: 30.7 pg (ref 26.0–34.0)
MCHC: 31.3 g/dL (ref 30.0–36.0)
MCV: 98.2 fL (ref 78.0–100.0)
Platelets: 217 10*3/uL (ref 150–400)
RBC: 3.26 MIL/uL — AB (ref 3.87–5.11)
RDW: 15.6 % — AB (ref 11.5–15.5)
WBC: 5.7 10*3/uL (ref 4.0–10.5)

## 2016-06-29 LAB — CBG MONITORING, ED: GLUCOSE-CAPILLARY: 144 mg/dL — AB (ref 65–99)

## 2016-06-29 LAB — BRAIN NATRIURETIC PEPTIDE: B NATRIURETIC PEPTIDE 5: 4002.8 pg/mL — AB (ref 0.0–100.0)

## 2016-06-29 LAB — COMPREHENSIVE METABOLIC PANEL
ALT: 18 U/L (ref 14–54)
ANION GAP: 12 (ref 5–15)
AST: 26 U/L (ref 15–41)
Albumin: 3.8 g/dL (ref 3.5–5.0)
Alkaline Phosphatase: 56 U/L (ref 38–126)
BILIRUBIN TOTAL: 1 mg/dL (ref 0.3–1.2)
BUN: 65 mg/dL — ABNORMAL HIGH (ref 6–20)
CHLORIDE: 99 mmol/L — AB (ref 101–111)
CO2: 26 mmol/L (ref 22–32)
Calcium: 10.4 mg/dL — ABNORMAL HIGH (ref 8.9–10.3)
Creatinine, Ser: 5.19 mg/dL — ABNORMAL HIGH (ref 0.44–1.00)
GFR calc Af Amer: 8 mL/min — ABNORMAL LOW (ref >60)
GFR, EST NON AFRICAN AMERICAN: 7 mL/min — AB (ref >60)
Glucose, Bld: 170 mg/dL — ABNORMAL HIGH (ref 65–99)
POTASSIUM: 4.2 mmol/L (ref 3.5–5.1)
Sodium: 137 mmol/L (ref 135–145)
TOTAL PROTEIN: 7.2 g/dL (ref 6.5–8.1)

## 2016-06-29 LAB — DIFFERENTIAL
BASOS PCT: 0 %
Basophils Absolute: 0 10*3/uL (ref 0.0–0.1)
EOS ABS: 0.1 10*3/uL (ref 0.0–0.7)
EOS PCT: 2 %
LYMPHS ABS: 1.1 10*3/uL (ref 0.7–4.0)
Lymphocytes Relative: 20 %
MONOS PCT: 6 %
Monocytes Absolute: 0.3 10*3/uL (ref 0.1–1.0)
NEUTROS PCT: 72 %
Neutro Abs: 4.1 10*3/uL (ref 1.7–7.7)

## 2016-06-29 LAB — I-STAT CHEM 8, ED
BUN: 61 mg/dL — ABNORMAL HIGH (ref 6–20)
CREATININE: 5.2 mg/dL — AB (ref 0.44–1.00)
Calcium, Ion: 1.13 mmol/L (ref 1.12–1.23)
Chloride: 99 mmol/L — ABNORMAL LOW (ref 101–111)
Glucose, Bld: 165 mg/dL — ABNORMAL HIGH (ref 65–99)
HEMATOCRIT: 33 % — AB (ref 36.0–46.0)
HEMOGLOBIN: 11.2 g/dL — AB (ref 12.0–15.0)
Potassium: 4.2 mmol/L (ref 3.5–5.1)
SODIUM: 139 mmol/L (ref 135–145)
TCO2: 28 mmol/L (ref 0–100)

## 2016-06-29 LAB — I-STAT TROPONIN, ED: TROPONIN I, POC: 0.01 ng/mL (ref 0.00–0.08)

## 2016-06-29 LAB — PROTIME-INR
INR: 1.21 (ref 0.00–1.49)
Prothrombin Time: 15.4 seconds — ABNORMAL HIGH (ref 11.6–15.2)

## 2016-06-29 LAB — APTT: APTT: 34 s (ref 24–37)

## 2016-06-29 MED ORDER — ONDANSETRON HCL 4 MG/2ML IJ SOLN: 4.0000 mg | Freq: Four times a day (QID) | INTRAMUSCULAR | Status: DC | PRN

## 2016-06-29 MED ORDER — STROKE: EARLY STAGES OF RECOVERY BOOK
Freq: Once | Status: AC
  Administered 2016-06-29: 23:00:00
  Filled 2016-06-29: qty 1

## 2016-06-29 MED ORDER — SODIUM CHLORIDE 0.9 % IV SOLN
510.0000 mg | INTRAVENOUS | Status: AC
  Administered 2016-06-29: 510 mg via INTRAVENOUS
  Filled 2016-06-29: qty 17

## 2016-06-29 MED ORDER — ENOXAPARIN SODIUM 30 MG/0.3ML ~~LOC~~ SOLN
30.0000 mg | SUBCUTANEOUS | Status: DC
  Administered 2016-06-30 – 2016-07-04 (×5): 30 mg via SUBCUTANEOUS
  Filled 2016-06-29 (×5): qty 0.3

## 2016-06-29 MED ORDER — ONDANSETRON HCL 4 MG PO TABS: 4.0000 mg | ORAL_TABLET | Freq: Four times a day (QID) | ORAL | Status: DC | PRN

## 2016-06-29 MED ORDER — IOPAMIDOL (ISOVUE-370) INJECTION 76%
INTRAVENOUS | Status: AC
  Filled 2016-06-29: qty 100

## 2016-06-29 NOTE — ED Notes (Signed)
EDP at bedside  

## 2016-06-29 NOTE — ED Provider Notes (Addendum)
CSN: [id number]     Arrival date & time 06/29/16  1703 History  By signing my name below, I, Dorothy Bartlett, attest that this documentation has been prepared under the direction and in the presence of Dorothy Kaplan, MD . Electronically Signed: Marisue Bartlett, Scribe. 06/29/2016. 6:20 PM.   Chief Complaint  Patient presents with  . Code Stroke    The history is provided by the patient and a relative. No language interpreter was used.   HPI Comments:  Dorothy Bartlett is a 82 y.o. female with PMHx of HLD, HTN, carotid stenosis, CAD, obesity, PVD, CHF, complete heart block, CKD and DM who presents to the Emergency Department via EMS as code stroke. Pt was last seen normal 2 hours ago at 1400. Pt's daughter found her on the front porch, with right-side facial droop, drooling, difficulty speaking, and inability to use her right hand and arm. Her facial droop is mildly improved currently. Family states pt became short of breath on arriving in the ED. They note pt normally has shortness of breath only with exertion, but she has been getting short of breath more easily in the past few days. Pt states she has been waking up in the middle of the night gasping for air. Pt uses oxygen at home as needed. Denies chest pain, h/o stroke.  Past Medical History  Diagnosis Date  . Hyperlipidemia   . Carotid stenosis     Carotid US (12/2013): < 60% RCA, < 40% LICA.  [name] Hypertension   . Coronary artery disease     a. s/p MI 1995 tx w/ PTCA to Dx, Ramus, ap LAD;  b. LHC (11/2004):  Inferior HK, EF 60%, proximal LAD 50%, ostial D1 80%, mid D1 95%, mid LAD 50-60%, distal LAD at the apex 90%, OM1 occluded (fills late by left to left collaterals), RCA 75%. Medical therapy recommended.;  c. Lexiscan Myoview (12/2013):  Prior ant-lat scar with very small area of peri-infarct ischemia in ant wall, EF 40 (high risk)   . Obesity (BMI 30-39.9)   . Intrinsic asthma 01/28/2008     Mild intermittant asthma, PFT's 01/28/08     .  Old anterolateral myocardial infarction   . PVD (peripheral vascular disease) (HCC) 02/16/2014    2015, right femoral peroneal bypass grafting and left femoral-tibial bypass grafting by Dr. Arbie Cookey for rest pain as well as nonhealing ulcer on toe accompanied with toe amputation  Arteriogram October 2015 with patent graft on the right, 70% stenosis of graft on the left proximally.   . Chronic combined systolic and diastolic heart failure (HCC) 04/13/2014  . Complete heart block (HCC)     a. 05/2015 s/p [facility] Assurity DR model [id number] dual chamber PPM (serial number [id number]) (Dr. Johney Frame).  [name] Hx of echocardiogram     a. Echo 5/16: dist septal, dist ant, dist inf and apical AK, EF 40-45%, mod MR, mild LAE, PASP 72  . Presence of permanent cardiac pacemaker   . Chronic kidney disease (CKD), stage V (HCC)     notes 07/03/2015; Dr. Hyman Hopes  . 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 Type 2 diabetes mellitus with renal manifestations [facility])    Past Surgical History  Procedure Laterality Date  . Repair of nerve in left arm  Left 2000  . Cardiac catheterization  1995    w/ PTCA Diag 2nd MI  . Femoral-tibial bypass graft Left 02/24/2014    Procedure: BYPASS GRAFT FEMORAL-TIBIAL ARTERY;  Surgeon: Larina Earthly, MD;  Location: [facility] OR;  Service: Vascular;  Laterality: Left;  . Femoral-popliteal bypass graft Right 08/04/2014    Procedure: RIGHT FEMORAL-PERONEAL ARTERY BYPASS GRAFT;  Surgeon: Larina Earthly, MD;  Location: [facility] OR;  Service: Vascular;  Laterality: Right;  . Amputation Right 08/04/2014    Procedure: AMPUTATION DIGIT-RIGHT 3RD TOE;  Surgeon: Larina Earthly, MD;  Location: [facility] OR;  Service: Vascular;  Laterality: Right;  . Lower extremity angiogram Left 01/08/2014    Procedure: LOWER EXTREMITY ANGIOGRAM;  Surgeon: Sherren Kerns, MD;  Location: [facility] CATH LAB;  Service: Cardiovascular;  Laterality: Left;  . Abdominal angiogram  01/08/2014    Procedure: ABDOMINAL  ANGIOGRAM;  Surgeon: Sherren Kerns, MD;  Location: [facility] CATH LAB;  Service: Cardiovascular;;  . Lower extremity angiogram Right 08/03/2014    Procedure: LOWER EXTREMITY ANGIOGRAM;  Surgeon: Nada Libman, MD;  Location: [facility] CATH LAB;  Service: Cardiovascular;  Laterality: Right;  . Abdominal aortagram N/A 10/25/2014    Procedure: ABDOMINAL Ronny Flurry;  Surgeon: Chuck Hint, MD;  Location: [facility] CATH LAB;  Service: Cardiovascular;  Laterality: N/A;  . Ep implantable device N/A 05/10/2015    Procedure: Pacemaker Implant;  Surgeon: Hillis Range, MD;  Location: [facility] INVASIVE CV LAB;  Service: Cardiovascular;  Laterality: N/A;  . Ep implantable device N/A 06/13/2015    Procedure: Lead Revision/Repair;  Surgeon: Duke Salvia, MD;  Location: [facility] INVASIVE CV LAB;  Service: Cardiovascular;  Laterality: N/A;  . Pacemaker lead removal  06/13/2015    Procedure: Pacemaker Lead Removal;  Surgeon: Duke Salvia, MD;  Location: [facility] INVASIVE CV LAB;  Service: Cardiovascular;;  . Insert / replace / remove pacemaker     Family History  Problem Relation Age of Onset  . Diabetes Father   . Hypertension Father   . Hypertension Daughter   . Hyperlipidemia Daughter   . Hyperlipidemia Son   . Hypertension Sister    Social History  Substance Use Topics  . Smoking status: Never Smoker   . Smokeless tobacco: Never Used  . Alcohol Use: No   OB History    No data available     Review of Systems  HENT: Positive for drooling.   Respiratory: Positive for shortness of breath.   Cardiovascular: Negative for chest pain.  Neurological: Positive for facial asymmetry, speech difficulty and weakness.  All other systems reviewed and are negative.   Allergies  Review of patient's allergies indicates no known allergies.  Home Medications   Prior to Admission medications   Medication Sig Start Date End Date Taking? Authorizing Provider  albuterol (PROVENTIL HFA;VENTOLIN HFA) 108 (90 BASE) MCG/ACT inhaler Inhale  2 puffs into the lungs every 6 (six) hours as needed for wheezing or shortness of breath.   Yes Historical Provider, MD  aspirin 81 MG tablet Take 81 mg by mouth every morning.    Yes Historical Provider, MD  atorvastatin (LIPITOR) 40 MG tablet Take 40 mg by mouth every morning.  01/25/14  Yes Historical Provider, MD  B Complex-C-Folic Acid (RENA-VITE RX) 1 MG TABS Take 1 tablet by mouth every morning.  02/05/14  Yes Historical Provider, MD  carvedilol (COREG) 12.5 MG tablet Take 1 tablet (12.5 mg total) by mouth 2 (two) times daily. 02/04/14  Yes Lyn Records, MD  docusate sodium (COLACE) 100 MG capsule Take 100 mg by mouth 2 (two) times daily.    Yes Historical Provider, MD  furosemide (LASIX) 40 MG tablet Take 1  tablet (40 mg total) by mouth 2 (two) times daily. Patient taking differently: Take 80 mg by mouth 2 (two) times daily.  11/09/15  Yes Lyn RecordsHenry W Smith, MD  hydrALAZINE (APRESOLINE) 10 MG tablet Take 1 tablet (10 mg total) by mouth 3 (three) times daily. 09/21/15  Yes Duke SalviaSteven C Klein, MD  isosorbide mononitrate (IMDUR) 30 MG 24 hr tablet Take 30 mg by mouth daily.   Yes Historical Provider, MD  JANUVIA 25 MG tablet Take 25 mg by mouth daily. 06/29/15  Yes Historical Provider, MD  loratadine (CLARITIN) 10 MG tablet Take 10 mg by mouth daily as needed for allergies.   Yes Historical Provider, MD  Melatonin 3 MG TABS Take 3 mg by mouth at bedtime as needed (for sleep).    Yes Historical Provider, MD  nitroGLYCERIN (NITROSTAT) 0.4 MG SL tablet Place 1 tablet (0.4 mg total) under the tongue every 5 (five) minutes x 3 doses as needed for chest pain. 06/17/15  Yes Lyn RecordsHenry W Smith, MD  paricalcitol (ZEMPLAR) 1 MCG capsule Take 1 mcg by mouth daily.  04/07/14  Yes Historical Provider, MD  Vitamin D, Ergocalciferol, (DRISDOL) 50000 UNITS CAPS capsule Take 1 capsule (50,000 Units total) by mouth every 7 (seven) days. 06/15/15  Yes Harold BarbanLawrence Ngo, MD   BP 151/71 mmHg  Pulse 73  Temp(Src) 98 F (36.7 C) (Oral)  Resp  24  Ht 4\' 11"  (1.499 m)  Wt 171 lb 4.8 oz (77.7 kg)  BMI 34.58 kg/m2  SpO2 100%   Physical Exam  Constitutional: She is oriented to person, place, and time. She appears well-developed and well-nourished. No distress.  HENT:  Head: Normocephalic and atraumatic.  Eyes: EOM are normal.  Neck: Normal range of motion. No JVD present.  Cardiovascular: Normal rate, regular rhythm and normal heart sounds.   Pulmonary/Chest: She has rales.  Bibasilar rales  Abdominal: Soft. She exhibits no distension. There is no tenderness.  Musculoskeletal: Normal range of motion.  Lower Extremities: 2+ pitting edema, worse on right  Neurological: She is alert and oriented to person, place, and time.  Right sided paralysis upper and lower extremity and mild droop  Skin: Skin is warm and dry.  Psychiatric: She has a normal mood and affect. Judgment normal.  Nursing note and vitals reviewed.   ED Course  .Critical Care Performed by: Dorothy KaplanNANAVATI, Aayushi Solorzano Authorized by: Dorothy KaplanNANAVATI, Rakeen Gaillard Total critical care time: 45 minutes Critical care time was exclusive of separately billable procedures and treating other patients. Critical care was necessary to treat or prevent imminent or life-threatening deterioration of the following conditions: respiratory failure. Critical care was time spent personally by me on the following activities: blood draw for specimens, discussions with consultants, development of treatment plan with patient or surrogate, interpretation of cardiac output measurements, evaluation of patient's response to treatment, examination of patient, obtaining history from patient or surrogate, ordering and performing treatments and interventions, ordering and review of laboratory studies, ordering and review of radiographic studies, pulse oximetry and re-evaluation of patient's condition.    DIAGNOSTIC STUDIES:  Oxygen Saturation is 98% on [place], normal by my interpretation.    COORDINATION OF CARE:  6:20  PM Pt started on Bipap. Discussed treatment plan with pt at bedside and pt agreed to plan.  6:40 PM Spoke with neurologist.  Labs Review Labs Reviewed  PROTIME-INR - Abnormal; Notable for the following:    Prothrombin Time 15.4 (*)    All other components within normal limits  CBC - Abnormal; Notable for  the following:    RBC 3.26 (*)    Hemoglobin 10.0 (*)    HCT 32.0 (*)    RDW 15.6 (*)    All other components within normal limits  COMPREHENSIVE METABOLIC PANEL - Abnormal; Notable for the following:    Chloride 99 (*)    Glucose, Bld 170 (*)    BUN 65 (*)    Creatinine, Ser 5.19 (*)    Calcium 10.4 (*)    GFR calc non Af Amer 7 (*)    GFR calc Af Amer 8 (*)    All other components within normal limits  I-STAT CHEM 8, ED - Abnormal; Notable for the following:    Chloride 99 (*)    BUN 61 (*)    Creatinine, Ser 5.20 (*)    Glucose, Bld 165 (*)    Hemoglobin 11.2 (*)    HCT 33.0 (*)    All other components within normal limits  APTT  DIFFERENTIAL  BRAIN NATRIURETIC PEPTIDE  I-STAT TROPOININ, ED    Imaging Review Ct Angio Head W Or Wo Contrast  06/29/2016  CLINICAL DATA:  82 year old female with right-sided weakness and facial droop. EXAM: CT ANGIOGRAPHY HEAD AND NECK TECHNIQUE: Multidetector CT imaging of the head and neck was performed using the standard protocol during bolus administration of intravenous contrast. Multiplanar CT image reconstructions and MIPs were obtained to evaluate the vascular anatomy. Carotid stenosis measurements (when applicable) are obtained utilizing NASCET criteria, using the distal internal carotid diameter as the denominator. CONTRAST:  50 cc Isovue 370. This decision was made by the neurologist based on risk benefit ratio and the fact the patient cannot have an MR scan because of pacemaker. COMPARISON:  06/29/2016 head CT. FINDINGS: CT HEAD Brain: Small vessel disease changes without CT evidence of large acute infarct. No intracranial mass or  abnormal enhancement. Global mild atrophy without hydrocephalus. Calvarium and skull base: Negative. Paranasal sinuses: Minimal left maxillary sinus mucosal thickening. Orbits: No acute abnormality. CTA NECK Aortic arch: Calcified plaque without high-grade stenosis. Three vessel arch. Right carotid system: Plaque proximal right internal carotid artery with greater than 75% diameter stenosis. Left carotid system: Ectatic left carotid artery with bifurcation posterior to the pharynx. No hemodynamically significant stenosis of the left internal carotid artery. Vertebral arteries:Mild narrowing origin of vertebral arteries bilaterally. Left vertebral artery dominant in size. Skeleton: Multilevel cervical spondylotic changes with various degrees of spinal stenosis and foraminal narrowing. Limbus vertebra C7/protrusion with marked canal narrowing. Other neck: Bilateral pleural effusions greater on right. Lung parenchymal changes partially reflective of pulmonary vascular congestion however, ground-glass opacities throughout the right lung apex including 1.4 cm rounded consolidation. This appearance is atypical for congestive heart. Recommend follow-up chest CT in 3 months after treatment of any congestive heart failure or infectious infiltrate. CTA HEAD Anterior circulation: Prominent calcification cavernous segment internal carotid artery bilaterally with moderate to marked narrowing. Mild narrowing left middle cerebral artery M1 segment. Moderate narrowing right middle cerebral artery M1 segment. Moderate to marked narrowing A1 segment right anterior cerebral artery. Posterior circulation: Left vertebral artery is dominant. Moderate to marked narrowing vertebral arteries at the level of the foramen magnum. Mild narrowing and irregularity basilar artery. Moderate narrowing proximal mid and distal aspect posterior cerebral artery bilaterally. Venous sinuses: Patent. Anatomic variants: Negative. Delayed phase: Negative  IMPRESSION: CT Small vessel disease changes without CT evidence of large acute infarct. No intracranial mass or abnormal enhancement. Global mild atrophy without hydrocephalus. CTA NECK Aortic atherosclerosis. Plaque proximal right  internal carotid artery with greater than 75% diameter stenosis. Ectatic left carotid artery with bifurcation posterior to the pharynx. No hemodynamically significant stenosis of the left internal carotid artery. Mild narrowing origin of vertebral arteries bilaterally. Left vertebral artery dominant in size. Multilevel cervical spondylotic changes with various degrees of spinal stenosis and foraminal narrowing. Limbus vertebra C7/protrusion with marked canal narrowing. Bilateral pleural effusions greater on right. Lung parenchymal changes partially reflective of pulmonary vascular congestion however, ground-glass opacities throughout the right lung apex including 1.4 cm rounded consolidation. This appearance is atypical for congestive heart. Recommend follow-up chest CT in 3 months after treatment of any congestive heart failure or infectious infiltrate. CTA HEAD Prominent calcification cavernous segment internal carotid artery bilaterally with moderate to marked narrowing. Mild narrowing left middle cerebral artery M1 segment. Moderate narrowing right middle cerebral artery M1 segment. Moderate to marked narrowing A1 segment right anterior cerebral artery. Left vertebral artery is dominant. Moderate to marked narrowing vertebral arteries at the level of the foramen magnum. Mild narrowing and irregularity basilar artery. Moderate narrowing proximal mid and distal aspect posterior cerebral artery bilaterally. These results were called by telephone at the time of interpretation on 06/29/2016 at 5:43 pm to Dr. Derwood Bartlett , who verbally acknowledged these results. Electronically Signed   By: Lacy Duverney M.D.   On: 06/29/2016 18:31   Ct Angio Neck W Or Wo Contrast  06/29/2016  CLINICAL  DATA:  82 year old female with right-sided weakness and facial droop. EXAM: CT ANGIOGRAPHY HEAD AND NECK TECHNIQUE: Multidetector CT imaging of the head and neck was performed using the standard protocol during bolus administration of intravenous contrast. Multiplanar CT image reconstructions and MIPs were obtained to evaluate the vascular anatomy. Carotid stenosis measurements (when applicable) are obtained utilizing NASCET criteria, using the distal internal carotid diameter as the denominator. CONTRAST:  50 cc Isovue 370. This decision was made by the neurologist based on risk benefit ratio and the fact the patient cannot have an MR scan because of pacemaker. COMPARISON:  06/29/2016 head CT. FINDINGS: CT HEAD Brain: Small vessel disease changes without CT evidence of large acute infarct. No intracranial mass or abnormal enhancement. Global mild atrophy without hydrocephalus. Calvarium and skull base: Negative. Paranasal sinuses: Minimal left maxillary sinus mucosal thickening. Orbits: No acute abnormality. CTA NECK Aortic arch: Calcified plaque without high-grade stenosis. Three vessel arch. Right carotid system: Plaque proximal right internal carotid artery with greater than 75% diameter stenosis. Left carotid system: Ectatic left carotid artery with bifurcation posterior to the pharynx. No hemodynamically significant stenosis of the left internal carotid artery. Vertebral arteries:Mild narrowing origin of vertebral arteries bilaterally. Left vertebral artery dominant in size. Skeleton: Multilevel cervical spondylotic changes with various degrees of spinal stenosis and foraminal narrowing. Limbus vertebra C7/protrusion with marked canal narrowing. Other neck: Bilateral pleural effusions greater on right. Lung parenchymal changes partially reflective of pulmonary vascular congestion however, ground-glass opacities throughout the right lung apex including 1.4 cm rounded consolidation. This appearance is atypical  for congestive heart. Recommend follow-up chest CT in 3 months after treatment of any congestive heart failure or infectious infiltrate. CTA HEAD Anterior circulation: Prominent calcification cavernous segment internal carotid artery bilaterally with moderate to marked narrowing. Mild narrowing left middle cerebral artery M1 segment. Moderate narrowing right middle cerebral artery M1 segment. Moderate to marked narrowing A1 segment right anterior cerebral artery. Posterior circulation: Left vertebral artery is dominant. Moderate to marked narrowing vertebral arteries at the level of the foramen magnum. Mild narrowing and irregularity basilar  artery. Moderate narrowing proximal mid and distal aspect posterior cerebral artery bilaterally. Venous sinuses: Patent. Anatomic variants: Negative. Delayed phase: Negative IMPRESSION: CT Small vessel disease changes without CT evidence of large acute infarct. No intracranial mass or abnormal enhancement. Global mild atrophy without hydrocephalus. CTA NECK Aortic atherosclerosis. Plaque proximal right internal carotid artery with greater than 75% diameter stenosis. Ectatic left carotid artery with bifurcation posterior to the pharynx. No hemodynamically significant stenosis of the left internal carotid artery. Mild narrowing origin of vertebral arteries bilaterally. Left vertebral artery dominant in size. Multilevel cervical spondylotic changes with various degrees of spinal stenosis and foraminal narrowing. Limbus vertebra C7/protrusion with marked canal narrowing. Bilateral pleural effusions greater on right. Lung parenchymal changes partially reflective of pulmonary vascular congestion however, ground-glass opacities throughout the right lung apex including 1.4 cm rounded consolidation. This appearance is atypical for congestive heart. Recommend follow-up chest CT in 3 months after treatment of any congestive heart failure or infectious infiltrate. CTA HEAD Prominent  calcification cavernous segment internal carotid artery bilaterally with moderate to marked narrowing. Mild narrowing left middle cerebral artery M1 segment. Moderate narrowing right middle cerebral artery M1 segment. Moderate to marked narrowing A1 segment right anterior cerebral artery. Left vertebral artery is dominant. Moderate to marked narrowing vertebral arteries at the level of the foramen magnum. Mild narrowing and irregularity basilar artery. Moderate narrowing proximal mid and distal aspect posterior cerebral artery bilaterally. These results were called by telephone at the time of interpretation on 06/29/2016 at 5:43 pm to Dr. Derwood KaplanANKIT Dayzee Trower , who verbally acknowledged these results. Electronically Signed   By: Lacy DuverneySteven  Olson M.D.   On: 06/29/2016 18:31   Dg Chest Port 1 View  06/29/2016  CLINICAL DATA:  Right-sided weakness and aphasia EXAM: PORTABLE CHEST 1 VIEW COMPARISON:  07/05/2015 FINDINGS: Pacing device is again identified and stable. Cardiomegaly is again noted. Aortic calcifications are seen and stable. The lungs are well aerated bilaterally. Mild congestive failure is again noted. No focal infiltrate is seen. IMPRESSION: Mild CHF. Aortic atherosclerosis. Electronically Signed   By: Alcide CleverMark  Lukens M.D.   On: 06/29/2016 18:30   Ct Head Code Stroke W/o Cm  06/29/2016  CLINICAL DATA:  Code stroke.  Right-sided weakness and facial droop. EXAM: CT HEAD WITHOUT CONTRAST TECHNIQUE: Contiguous axial images were obtained from the base of the skull through the vertex without intravenous contrast. COMPARISON:  05/03/2005 FINDINGS: Brain: No evidence of cortical infarction, hemorrhage, hydrocephalus, or mass lesion/mass effect. Patchy white matter ischemic gliosis that has progressed since previous, overall mild for age. There have been lacunar infarcts in the bilateral thalamus. Dilated perivascular space below the right putamen. Aspects is 10. Vascular: No hyperdense vessel. Calcified intracranial  atherosclerosis Skull: Negative for fracture or focal lesion. Sinuses/Orbits: No acute finding. Other: None. Attempt to call at the time of interpretation on 06/29/2016 at 5:29 and 5:34 pm to Dr. Nicholas LoseEshraghi, but cell phone not working [phone]). These results were called by telephone at the time of interpretation on 06/29/2016 at 5:36 pm to Dr. Tyrone AppleEMILY NGUYEN , who verbally acknowledged these results. IMPRESSION: 1. No intracranial hemorrhage or definite acute infarct. 2. Chronic microvascular disease and mild for age atrophy. Electronically Signed   By: Marnee SpringJonathon  Watts M.D.   On: 06/29/2016 17:37   I have personally reviewed and evaluated these images and lab results as part of my medical decision-making.   EKG Interpretation None     ED ECG REPORT   Date: 06/29/2016  Rate: 87  Rhythm: normal  sinus rhythm  QRS Axis: left  Intervals: QT prolonged  ST/T Wave abnormalities: nonspecific ST/T changes  Conduction Disutrbances:left bundle branch block  Narrative Interpretation:   Old EKG Reviewed: changes noted  I have personally reviewed the EKG tracing and agree with the computerized printout as noted.  MDM   Final diagnoses:  Acute ischemic stroke (HCC)  Acute respiratory failure with hypoxia (HCC)  Acute on chronic combined systolic and diastolic heart failure (HCC)    I personally performed the services described in this documentation, which was scribed in my presence. The recorded information has been reviewed and is accurate.  Pt comes in with cc of R sided weakness. She is also noted to be in resp distress.  Code stroke activated. Neuro saw the patient, proceeded with CT-Angio, and there is no large lesion noted for intervention purposes. TPA not indicated either per Neuro staff.  Pt is in hypoxic resp distress. Started on CPAP. She has mild CHF on Xrays. Pt has slight worsening of her Cr and therefore in acute on chronic renal failure. We will admit for  optimization.     Dorothy Kaplan, MD 06/29/16 1610  Dorothy Kaplan, MD 06/29/16 2013

## 2016-06-29 NOTE — ED Notes (Signed)
Paged neuro/ESHRAGHI

## 2016-06-29 NOTE — Consult Note (Signed)
Requesting Physician: ER    Chief Complaint: Code STroke  History obtained from:  Patient and Chart.    HPI:                                                                                                                                         Dorothy Bartlett is an 82 y.o. female who presented with aphasia and right sided weakness. Last seen normal by daughter at 1:55 pm.  She has a history of HTN, DM, CKD  Date last known well: Date: 06/30/2015 Time last known well: Time: 02:00 tPA Given: NO No Symptoms         0 No significant disability/able to carry out all usual activities   1 Unable to carry out all previous activities but looks after own affairs             2 Requires help but walks without assistance     3 Unable to walk without assistance/unable to handle own bodily needs 4 Bedridden/incontinent        5 Dead          6  Modified Rankin: Rankin Score=2   Past Medical History  Diagnosis Date  . Hyperlipidemia   . Carotid stenosis     Carotid US (12/2013): < 60% RCA, < 40% LICA.  [name] Hypertension   . Coronary artery disease     a. s/p MI 1995 tx w/ PTCA to Dx, Ramus, ap LAD;  b. LHC (11/2004):  Inferior HK, EF 60%, proximal LAD 50%, ostial D1 80%, mid D1 95%, mid LAD 50-60%, distal LAD at the apex 90%, OM1 occluded (fills late by left to left collaterals), RCA 75%. Medical therapy recommended.;  c. Lexiscan Myoview (12/2013):  Prior ant-lat scar with very small area of peri-infarct ischemia in ant wall, EF 40 (high risk)   . Obesity (BMI 30-39.9)   . Intrinsic asthma 01/28/2008     Mild intermittant asthma, PFT's 01/28/08     . Old anterolateral myocardial infarction   . PVD (peripheral vascular disease) (HCC) 02/16/2014    2015, right femoral peroneal bypass grafting and left femoral-tibial bypass grafting by Dr. Arbie Cookey for rest pain as well as nonhealing ulcer on toe accompanied with toe amputation  Arteriogram October 2015 with patent graft on the right, 70% stenosis of graft on  the left proximally.   . Chronic combined systolic and diastolic heart failure (HCC) 04/13/2014  . Complete heart block (HCC)     a. 05/2015 s/p [facility] Assurity DR model [phone] dual chamber PPM (serial number [id number]) (Dr. Johney Frame).  [name] Hx of echocardiogram     a. Echo 5/16: dist septal, dist ant, dist inf and apical AK, EF 40-45%, mod MR, mild LAE, PASP 72  . Presence of permanent cardiac pacemaker   . Chronic kidney disease (CKD), stage V (HCC)     notes  07/03/2015; Dr. Hyman Hopes  . 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 Type 2 diabetes mellitus with renal manifestations [facility])     Past Surgical History  Procedure Laterality Date  . Repair of nerve in left arm  Left 2000  . Cardiac catheterization  1995    w/ PTCA Diag 2nd MI  . Femoral-tibial bypass graft Left 02/24/2014    Procedure: BYPASS GRAFT FEMORAL-TIBIAL ARTERY;  Surgeon: Larina Earthly, MD;  Location: [facility] OR;  Service: Vascular;  Laterality: Left;  . Femoral-popliteal bypass graft Right 08/04/2014    Procedure: RIGHT FEMORAL-PERONEAL ARTERY BYPASS GRAFT;  Surgeon: Larina Earthly, MD;  Location: [facility] OR;  Service: Vascular;  Laterality: Right;  . Amputation Right 08/04/2014    Procedure: AMPUTATION DIGIT-RIGHT 3RD TOE;  Surgeon: Larina Earthly, MD;  Location: [facility] OR;  Service: Vascular;  Laterality: Right;  . Lower extremity angiogram Left 01/08/2014    Procedure: LOWER EXTREMITY ANGIOGRAM;  Surgeon: Sherren Kerns, MD;  Location: [facility] CATH LAB;  Service: Cardiovascular;  Laterality: Left;  . Abdominal angiogram  01/08/2014    Procedure: ABDOMINAL ANGIOGRAM;  Surgeon: Sherren Kerns, MD;  Location: [facility] CATH LAB;  Service: Cardiovascular;;  . Lower extremity angiogram Right 08/03/2014    Procedure: LOWER EXTREMITY ANGIOGRAM;  Surgeon: Nada Libman, MD;  Location: [facility] CATH LAB;  Service: Cardiovascular;  Laterality: Right;  . Abdominal aortagram N/A 10/25/2014    Procedure: ABDOMINAL Ronny Flurry;  Surgeon:  Chuck Hint, MD;  Location: [facility] CATH LAB;  Service: Cardiovascular;  Laterality: N/A;  . Ep implantable device N/A 05/10/2015    Procedure: Pacemaker Implant;  Surgeon: Hillis Range, MD;  Location: [facility] INVASIVE CV LAB;  Service: Cardiovascular;  Laterality: N/A;  . Ep implantable device N/A 06/13/2015    Procedure: Lead Revision/Repair;  Surgeon: Duke Salvia, MD;  Location: [facility] INVASIVE CV LAB;  Service: Cardiovascular;  Laterality: N/A;  . Pacemaker lead removal  06/13/2015    Procedure: Pacemaker Lead Removal;  Surgeon: Duke Salvia, MD;  Location: [facility] INVASIVE CV LAB;  Service: Cardiovascular;;  . Insert / replace / remove pacemaker      Family History  Problem Relation Age of Onset  . Diabetes Father   . Hypertension Father   . Hypertension Daughter   . Hyperlipidemia Daughter   . Hyperlipidemia Son   . Hypertension Sister    Social History:  reports that she has never smoked. She has never used smokeless tobacco. She reports that she does not drink alcohol or use illicit drugs.  Allergies: No Known Allergies  Medications:                                                                                                                           Scheduled: . iopamidol        ROS:  History obtained from chart review  General ROS: negative for - chills, fatigue, fever, night sweats, weight gain or weight loss Psychological ROS: negative for - behavioral disorder, hallucinations, memory difficulties, mood swings or suicidal ideation Ophthalmic ROS: negative for - blurry vision, double vision, eye pain or loss of vision ENT ROS: negative for - epistaxis, nasal discharge, oral lesions, sore throat, tinnitus or vertigo Allergy and Immunology ROS: negative for - hives or itchy/watery eyes Hematological and Lymphatic ROS: negative for - bleeding  problems, bruising or swollen lymph nodes Endocrine ROS: negative for - galactorrhea, hair pattern changes, polydipsia/polyuria or temperature intolerance Respiratory ROS: negative for - cough, hemoptysis, shortness of breath or wheezing Cardiovascular ROS: negative for - chest pain, dyspnea on exertion, edema or irregular heartbeat Gastrointestinal ROS: negative for - abdominal pain, diarrhea, hematemesis, nausea/vomiting or stool incontinence Genito-Urinary ROS: negative for - dysuria, hematuria, incontinence or urinary frequency/urgency Musculoskeletal ROS: negative for - joint swelling or muscular weakness Neurological ROS: as noted in HPI Dermatological ROS: negative for rash and skin lesion changes  Neurologic Examination:                                                                                                      Blood pressure 160/84, pulse 90, temperature 98 F (36.7 C), temperature source Oral, resp. rate 24, height 4\' 11"  (1.499 m), weight 77.7 kg (171 lb 4.8 oz), SpO2 98 %.  HEENT-  Normocephalic, no lesions, without obvious abnormality.  Normal external eye and conjunctiva.  Normal TM's bilaterally.  Normal auditory canals and external ears. Normal external nose, mucus membranes and septum.  Normal pharynx. Cardiovascular- regular rate and rhythm, S1, S2 normal, no murmur, click, rub or gallop, pulses palpable throughout   Lungs- Heart exam - S1, S2 normal, no murmur, no gallop, rate regular Abdomen- soft, non-tender; bowel sounds normal; no masses,  no organomegaly Extremities- less then 2 second capillary refill Lymph-no adenopathy palpable Musculoskeletal-no joint tenderness, deformity or swelling Skin-warm and dry, no hyperpigmentation, vitiligo, or suspicious lesions  Neurological Examination Mental Status: Alert, oriented, thought content appropriate.  Speech fluent without evidence of aphasia.  Able to follow 3 step commands without difficulty. Cranial  Nerves: II: Discs flat bilaterally; Visual fields grossly normal, pupils equal, round, reactive to light and accommodation III,IV, VI: ptosis not present, extra-ocular motions intact bilaterally V,VII: Right lower facial droop VIII: hearing normal bilaterally IX,X: uvula rises symmetrically XI: bilateral shoulder shrug XII: midline tongue extension Motor: Right : Upper extremity   3/5    Left:     Upper extremity   5/5  Lower extremity   5/5     Lower extremity   5/5 Tone and bulk:normal tone throughout; no atrophy noted Sensory: Pinprick and light touch intact throughout, bilaterally Deep Tendon Reflexes: 2+ and symmetric throughout Plantars: Right: downgoing   Left: downgoing Cerebellar: normal finger-to-nose, normal rapid alternating movements and normal heel-to-shin test Gait: normal gait and station       Lab Results: Basic Metabolic Panel:  Recent Labs Lab 06/29/16 1711 06/29/16 1712  NA 137 139  K 4.2 4.2  CL 99* 99*  CO2 26  --   GLUCOSE 170* 165*  BUN 65* 61*  CREATININE 5.19* 5.20*  CALCIUM 10.4*  --     Liver Function Tests:  Recent Labs Lab 06/29/16 1711  AST 26  ALT 18  ALKPHOS 56  BILITOT 1.0  PROT 7.2  ALBUMIN 3.8   No results for input(s): LIPASE, AMYLASE in the last 168 hours. No results for input(s): AMMONIA in the last 168 hours.  CBC:  Recent Labs Lab 06/29/16 1711 06/29/16 1712  WBC 5.7  --   NEUTROABS 4.1  --   HGB 10.0* 11.2*  HCT 32.0* 33.0*  MCV 98.2  --   PLT 217  --     Cardiac Enzymes: No results for input(s): CKTOTAL, CKMB, CKMBINDEX, TROPONINI in the last 168 hours.  Lipid Panel: No results for input(s): CHOL, TRIG, HDL, CHOLHDL, VLDL, LDLCALC in the last 168 hours.  CBG: No results for input(s): GLUCAP in the last 168 hours.  Microbiology: Results for orders placed or performed during the hospital encounter of 06/11/15  MRSA PCR Screening     Status: None   Collection Time: 06/11/15  5:02 PM  Result  Value Ref Range Status   MRSA by PCR NEGATIVE NEGATIVE Final    Comment:        The GeneXpert MRSA Assay (FDA approved for NASAL specimens only), is one component of a comprehensive MRSA colonization surveillance program. It is not intended to diagnose MRSA infection nor to guide or monitor treatment for MRSA infections.     Coagulation Studies:  Recent Labs  06/29/16 1711  LABPROT 15.4*  INR 1.21    Imaging: Ct Head Code Stroke W/o Cm  06/29/2016  CLINICAL DATA:  Code stroke.  Right-sided weakness and facial droop. EXAM: CT HEAD WITHOUT CONTRAST TECHNIQUE: Contiguous axial images were obtained from the base of the skull through the vertex without intravenous contrast. COMPARISON:  05/03/2005 FINDINGS: Brain: No evidence of cortical infarction, hemorrhage, hydrocephalus, or mass lesion/mass effect. Patchy white matter ischemic gliosis that has progressed since previous, overall mild for age. There have been lacunar infarcts in the bilateral thalamus. Dilated perivascular space below the right putamen. Aspects is 10. Vascular: No hyperdense vessel. Calcified intracranial atherosclerosis Skull: Negative for fracture or focal lesion. Sinuses/Orbits: No acute finding. Other: None. Attempt to call at the time of interpretation on 06/29/2016 at 5:29 and 5:34 pm to Dr. Nicholas Lose, but cell phone not working [phone]). These results were called by telephone at the time of interpretation on 06/29/2016 at 5:36 pm to Dr. Tyrone Apple , who verbally acknowledged these results. IMPRESSION: 1. No intracranial hemorrhage or definite acute infarct. 2. Chronic microvascular disease and mild for age atrophy. Electronically Signed   By: Marnee Spring M.D.   On: 06/29/2016 17:37     CT Angiogram of the brain and neck performed:   Intracranial atherosclerotic disease throughout with about 50% stenosis of the left MCA.  No major clot identified.  This per verbal from the radiologist.    Weston Settle MD [phone]  06/29/2016, 6:03 PM   Assessment: 82 y.o. female with left hemisphere evolving infarct.  She arrived past the 3 hour window for IV tPA.  She is not a candidate for the 4.5 hour window due to age and diabetes.  I did CTA to see if acute large vessel clot amenable to mechanical thrombectomy.  There does not seem to be one.  It  may have been artery to artery distal embolization.  She can be placed on ASA + Plavix for short term 90 day risk reduction.  Risk factor modification.  Rehab.    Stroke Risk Factors - diabetes mellitus and hypertension  Weston SettleShervin Lariya Kinzie MD [phone]  06/29/2016, 6:03 PM

## 2016-06-29 NOTE — ED Notes (Signed)
Pt. Coming from home via [facility] for right sided weakness, facial droop, and aphasia. Last known normal 1355. EMS called code stroke in the field. Pt. Arrived [id number]. Pt. Received CT head and CT angio of head and neck. Pt. Hx of renal failure and complete heart block with pacemaker insertion.

## 2016-06-29 NOTE — ED Notes (Signed)
Checked CBG 144

## 2016-06-29 NOTE — ED Notes (Signed)
Pt. Cr 5.20. Neurology and radiologist working together to decide whether or not to do a CT angio. Neurology decided to do CT angio head and neck with 50cc contrast. Pt. Pacemaker, so MR is not an option. Pt. Hx of renal failure.

## 2016-06-29 NOTE — ED Notes (Signed)
Respiratory Therapist at bedside.

## 2016-06-29 NOTE — Plan of Care (Signed)
Problem: Safety: Goal: Ability to remain free from injury will improve Outcome: Progressing Discussed bed alarm and plan of care

## 2016-06-29 NOTE — ED Notes (Signed)
Res. Contacted at this time for bipap.

## 2016-06-29 NOTE — H&P (Signed)
History and Physical    Dorothy Bartlett:[id number] DOB: 03/15/1926 DOA: 06/29/2016  PCP: Eliott Nine, MD   Patient coming from: Home.  Chief Complaint: Stroke.  HPI: Dorothy Bartlett is a 82 y.o. female with medical history significant of hyperlipidemia, coronary artery stenosis, hypertension, CAD, chronic combined systolic and diastolic heart failure, PVD,obesity, intrinsic asthma, stage V chronic kidney disease was brought to the emergency department after she was found by her daughter on the front porch with right-sided facial droop, drooling and slurred speech and right-sided upper extremity paresis.   ED Course: Code stroke was called. Imaging so far has been negative. The patient was placed on BiPAP ventilation due to dyspnea and hypoxia.   Review of Systems: As per HPI otherwise 10 point review of systems negative.   Past Medical History  Diagnosis Date  . Hyperlipidemia   . Carotid stenosis     Carotid US (12/2013): < 60% RCA, < 40% LICA.  [name] Hypertension   . Coronary artery disease     a. s/p MI 1995 tx w/ PTCA to Dx, Ramus, ap LAD;  b. LHC (11/2004):  Inferior HK, EF 60%, proximal LAD 50%, ostial D1 80%, mid D1 95%, mid LAD 50-60%, distal LAD at the apex 90%, OM1 occluded (fills late by left to left collaterals), RCA 75%. Medical therapy recommended.;  c. Lexiscan Myoview (12/2013):  Prior ant-lat scar with very small area of peri-infarct ischemia in ant wall, EF 40 (high risk)   . Obesity (BMI 30-39.9)   . Intrinsic asthma 01/28/2008     Mild intermittant asthma, PFT's 01/28/08     . Old anterolateral myocardial infarction   . PVD (peripheral vascular disease) (HCC) 02/16/2014    2015, right femoral peroneal bypass grafting and left femoral-tibial bypass grafting by Dr. Arbie Cookey for rest pain as well as nonhealing ulcer on toe accompanied with toe amputation  Arteriogram October 2015 with patent graft on the right, 70% stenosis of graft on the left proximally.   .  Chronic combined systolic and diastolic heart failure (HCC) 04/13/2014  . Complete heart block (HCC)     a. 05/2015 s/p [facility] Assurity DR model [id number] dual chamber PPM (serial number [id number]) (Dr. Johney Frame).  [name] Hx of echocardiogram     a. Echo 5/16: dist septal, dist ant, dist inf and apical AK, EF 40-45%, mod MR, mild LAE, PASP 72  . Presence of permanent cardiac pacemaker   . Chronic kidney disease (CKD), stage V (HCC)     notes 07/03/2015; Dr. Hyman Hopes  . 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 Type 2 diabetes mellitus with renal manifestations [facility])     Past Surgical History  Procedure Laterality Date  . Repair of nerve in left arm  Left 2000  . Cardiac catheterization  1995    w/ PTCA Diag 2nd MI  . Femoral-tibial bypass graft Left 02/24/2014    Procedure: BYPASS GRAFT FEMORAL-TIBIAL ARTERY;  Surgeon: Larina Earthly, MD;  Location: [facility] OR;  Service: Vascular;  Laterality: Left;  . Femoral-popliteal bypass graft Right 08/04/2014    Procedure: RIGHT FEMORAL-PERONEAL ARTERY BYPASS GRAFT;  Surgeon: Larina Earthly, MD;  Location: [facility] OR;  Service: Vascular;  Laterality: Right;  . Amputation Right 08/04/2014    Procedure: AMPUTATION DIGIT-RIGHT 3RD TOE;  Surgeon: Larina Earthly, MD;  Location: [facility] OR;  Service: Vascular;  Laterality: Right;  . Lower extremity angiogram Left 01/08/2014    Procedure: LOWER  EXTREMITY ANGIOGRAM;  Surgeon: Sherren Kerns, MD;  Location: [facility] CATH LAB;  Service: Cardiovascular;  Laterality: Left;  . Abdominal angiogram  01/08/2014    Procedure: ABDOMINAL ANGIOGRAM;  Surgeon: Sherren Kerns, MD;  Location: [facility] CATH LAB;  Service: Cardiovascular;;  . Lower extremity angiogram Right 08/03/2014    Procedure: LOWER EXTREMITY ANGIOGRAM;  Surgeon: Nada Libman, MD;  Location: [facility] CATH LAB;  Service: Cardiovascular;  Laterality: Right;  . Abdominal aortagram N/A 10/25/2014    Procedure: ABDOMINAL Ronny Flurry;  Surgeon: Chuck Hint,  MD;  Location: [facility] CATH LAB;  Service: Cardiovascular;  Laterality: N/A;  . Ep implantable device N/A 05/10/2015    Procedure: Pacemaker Implant;  Surgeon: Hillis Range, MD;  Location: [facility] INVASIVE CV LAB;  Service: Cardiovascular;  Laterality: N/A;  . Ep implantable device N/A 06/13/2015    Procedure: Lead Revision/Repair;  Surgeon: Duke Salvia, MD;  Location: [facility] INVASIVE CV LAB;  Service: Cardiovascular;  Laterality: N/A;  . Pacemaker lead removal  06/13/2015    Procedure: Pacemaker Lead Removal;  Surgeon: Duke Salvia, MD;  Location: [facility] INVASIVE CV LAB;  Service: Cardiovascular;;  . Insert / replace / remove pacemaker       reports that she has never smoked. She has never used smokeless tobacco. She reports that she does not drink alcohol or use illicit drugs.  No Known Allergies  Family History  Problem Relation Age of Onset  . Diabetes Father   . Hypertension Father   . Hypertension Daughter   . Hyperlipidemia Daughter   . Hyperlipidemia Son   . Hypertension Sister     Prior to Admission medications   Medication Sig Start Date End Date Taking? Authorizing Provider  albuterol (PROVENTIL HFA;VENTOLIN HFA) 108 (90 BASE) MCG/ACT inhaler Inhale 2 puffs into the lungs every 6 (six) hours as needed for wheezing or shortness of breath.   Yes Historical Provider, MD  aspirin 81 MG tablet Take 81 mg by mouth every morning.    Yes Historical Provider, MD  atorvastatin (LIPITOR) 40 MG tablet Take 40 mg by mouth every morning.  01/25/14  Yes Historical Provider, MD  B Complex-C-Folic Acid (RENA-VITE RX) 1 MG TABS Take 1 tablet by mouth every morning.  02/05/14  Yes Historical Provider, MD  carvedilol (COREG) 12.5 MG tablet Take 1 tablet (12.5 mg total) by mouth 2 (two) times daily. 02/04/14  Yes Lyn Records, MD  docusate sodium (COLACE) 100 MG capsule Take 100 mg by mouth 2 (two) times daily.    Yes Historical Provider, MD  furosemide (LASIX) 40 MG tablet Take 1 tablet (40 mg total) by mouth 2  (two) times daily. Patient taking differently: Take 80 mg by mouth 2 (two) times daily.  11/09/15  Yes Lyn Records, MD  hydrALAZINE (APRESOLINE) 10 MG tablet Take 1 tablet (10 mg total) by mouth 3 (three) times daily. 09/21/15  Yes Duke Salvia, MD  isosorbide mononitrate (IMDUR) 30 MG 24 hr tablet Take 30 mg by mouth daily.   Yes Historical Provider, MD  JANUVIA 25 MG tablet Take 25 mg by mouth daily. 06/29/15  Yes Historical Provider, MD  loratadine (CLARITIN) 10 MG tablet Take 10 mg by mouth daily as needed for allergies.   Yes Historical Provider, MD  Melatonin 3 MG TABS Take 3 mg by mouth at bedtime as needed (for sleep).    Yes Historical Provider, MD  nitroGLYCERIN (NITROSTAT) 0.4 MG SL tablet Place 1 tablet (0.4 mg total) under  the tongue every 5 (five) minutes x 3 doses as needed for chest pain. 06/17/15  Yes Lyn RecordsHenry W Smith, MD  paricalcitol (ZEMPLAR) 1 MCG capsule Take 1 mcg by mouth daily.  04/07/14  Yes Historical Provider, MD  Vitamin D, Ergocalciferol, (DRISDOL) 50000 UNITS CAPS capsule Take 1 capsule (50,000 Units total) by mouth every 7 (seven) days. 06/15/15  Yes Harold BarbanLawrence Ngo, MD    Physical Exam: Filed Vitals:   06/29/16 1818 06/29/16 1836 06/29/16 1915 06/29/16 1915  BP: 178/91 163/90 151/71   Pulse: 92 80 73   Temp:      TempSrc:      Resp: 33 19 24   Height:      Weight:      SpO2:  100% 100% 100%      Constitutional: NAD, calm, comfortable Filed Vitals:   06/29/16 1818 06/29/16 1836 06/29/16 1915 06/29/16 1915  BP: 178/91 163/90 151/71   Pulse: 92 80 73   Temp:      TempSrc:      Resp: 33 19 24   Height:      Weight:      SpO2:  100% 100% 100%   Eyes: PERRL, lids and conjunctivae normal ENMT:BiPAP mask on. Neck: normal, supple, no masses, no thyromegaly Respiratory: clear to auscultation bilaterally, no wheezing, no crackles. Normal respiratory effort. No accessory muscle use.  Cardiovascular: Regular rate and rhythm, no murmurs / rubs / gallops. No  extremity edema. 2+ pedal pulses. No carotid bruits.  Abdomen: no tenderness, no masses palpated. No hepatosplenomegaly. Bowel sounds positive.  Musculoskeletal: no clubbing / cyanosis. Decreased muscle tone on the right upper extremity. Skin: no rashes, lesions, ulcers. No induration Neurologic: CN 2-12 grossly intact. Sensation intact, DTR normal. Slurred speech, Strength is 3/5 on  RUE, strength is 5/5 in remaining extremities Psychiatric: Unable to fully evaluate due to the patient using BiPAP mask.    Labs on Admission: I have personally reviewed following labs and imaging studies  CBC:  Recent Labs Lab 06/29/16 1711 06/29/16 1712  WBC 5.7  --   NEUTROABS 4.1  --   HGB 10.0* 11.2*  HCT 32.0* 33.0*  MCV 98.2  --   PLT 217  --    Basic Metabolic Panel:  Recent Labs Lab 06/29/16 1711 06/29/16 1712  NA 137 139  K 4.2 4.2  CL 99* 99*  CO2 26  --   GLUCOSE 170* 165*  BUN 65* 61*  CREATININE 5.19* 5.20*  CALCIUM 10.4*  --    GFR: Estimated Creatinine Clearance: 6.7 mL/min (by C-G formula based on Cr of 5.2). Liver Function Tests:  Recent Labs Lab 06/29/16 1711  AST 26  ALT 18  ALKPHOS 56  BILITOT 1.0  PROT 7.2  ALBUMIN 3.8   Coagulation Profile:  Recent Labs Lab 06/29/16 1711  INR 1.21   Urine analysis:    Component Value Date/Time   COLORURINE YELLOW 06/11/2015 1042   APPEARANCEUR CLEAR 06/11/2015 1042   LABSPEC 1.009 06/11/2015 1042   PHURINE 5.0 06/11/2015 1042   GLUCOSEU NEGATIVE 06/11/2015 1042   HGBUR NEGATIVE 06/11/2015 1042   BILIRUBINUR NEGATIVE 06/11/2015 1042   KETONESUR NEGATIVE 06/11/2015 1042   PROTEINUR NEGATIVE 06/11/2015 1042   UROBILINOGEN 0.2 06/11/2015 1042   NITRITE NEGATIVE 06/11/2015 1042   LEUKOCYTESUR NEGATIVE 06/11/2015 1042    Radiological Exams on Admission: Ct Angio Head W Or Wo Contrast  06/29/2016  CLINICAL DATA:  82 year old female with right-sided weakness and facial droop. EXAM: CT ANGIOGRAPHY  HEAD AND NECK  TECHNIQUE: Multidetector CT imaging of the head and neck was performed using the standard protocol during bolus administration of intravenous contrast. Multiplanar CT image reconstructions and MIPs were obtained to evaluate the vascular anatomy. Carotid stenosis measurements (when applicable) are obtained utilizing NASCET criteria, using the distal internal carotid diameter as the denominator. CONTRAST:  50 cc Isovue 370. This decision was made by the neurologist based on risk benefit ratio and the fact the patient cannot have an MR scan because of pacemaker. COMPARISON:  06/29/2016 head CT. FINDINGS: CT HEAD Brain: Small vessel disease changes without CT evidence of large acute infarct. No intracranial mass or abnormal enhancement. Global mild atrophy without hydrocephalus. Calvarium and skull base: Negative. Paranasal sinuses: Minimal left maxillary sinus mucosal thickening. Orbits: No acute abnormality. CTA NECK Aortic arch: Calcified plaque without high-grade stenosis. Three vessel arch. Right carotid system: Plaque proximal right internal carotid artery with greater than 75% diameter stenosis. Left carotid system: Ectatic left carotid artery with bifurcation posterior to the pharynx. No hemodynamically significant stenosis of the left internal carotid artery. Vertebral arteries:Mild narrowing origin of vertebral arteries bilaterally. Left vertebral artery dominant in size. Skeleton: Multilevel cervical spondylotic changes with various degrees of spinal stenosis and foraminal narrowing. Limbus vertebra C7/protrusion with marked canal narrowing. Other neck: Bilateral pleural effusions greater on right. Lung parenchymal changes partially reflective of pulmonary vascular congestion however, ground-glass opacities throughout the right lung apex including 1.4 cm rounded consolidation. This appearance is atypical for congestive heart. Recommend follow-up chest CT in 3 months after treatment of any congestive heart  failure or infectious infiltrate. CTA HEAD Anterior circulation: Prominent calcification cavernous segment internal carotid artery bilaterally with moderate to marked narrowing. Mild narrowing left middle cerebral artery M1 segment. Moderate narrowing right middle cerebral artery M1 segment. Moderate to marked narrowing A1 segment right anterior cerebral artery. Posterior circulation: Left vertebral artery is dominant. Moderate to marked narrowing vertebral arteries at the level of the foramen magnum. Mild narrowing and irregularity basilar artery. Moderate narrowing proximal mid and distal aspect posterior cerebral artery bilaterally. Venous sinuses: Patent. Anatomic variants: Negative. Delayed phase: Negative IMPRESSION: CT Small vessel disease changes without CT evidence of large acute infarct. No intracranial mass or abnormal enhancement. Global mild atrophy without hydrocephalus. CTA NECK Aortic atherosclerosis. Plaque proximal right internal carotid artery with greater than 75% diameter stenosis. Ectatic left carotid artery with bifurcation posterior to the pharynx. No hemodynamically significant stenosis of the left internal carotid artery. Mild narrowing origin of vertebral arteries bilaterally. Left vertebral artery dominant in size. Multilevel cervical spondylotic changes with various degrees of spinal stenosis and foraminal narrowing. Limbus vertebra C7/protrusion with marked canal narrowing. Bilateral pleural effusions greater on right. Lung parenchymal changes partially reflective of pulmonary vascular congestion however, ground-glass opacities throughout the right lung apex including 1.4 cm rounded consolidation. This appearance is atypical for congestive heart. Recommend follow-up chest CT in 3 months after treatment of any congestive heart failure or infectious infiltrate. CTA HEAD Prominent calcification cavernous segment internal carotid artery bilaterally with moderate to marked narrowing. Mild  narrowing left middle cerebral artery M1 segment. Moderate narrowing right middle cerebral artery M1 segment. Moderate to marked narrowing A1 segment right anterior cerebral artery. Left vertebral artery is dominant. Moderate to marked narrowing vertebral arteries at the level of the foramen magnum. Mild narrowing and irregularity basilar artery. Moderate narrowing proximal mid and distal aspect posterior cerebral artery bilaterally. These results were called by telephone at the time of interpretation on 06/29/2016  at 5:43 pm to Dr. Derwood KaplanANKIT NANAVATI , who verbally acknowledged these results. Electronically Signed   By: Lacy DuverneySteven  Olson M.D.   On: 06/29/2016 18:31   Ct Angio Neck W Or Wo Contrast  06/29/2016  CLINICAL DATA:  82 year old female with right-sided weakness and facial droop. EXAM: CT ANGIOGRAPHY HEAD AND NECK TECHNIQUE: Multidetector CT imaging of the head and neck was performed using the standard protocol during bolus administration of intravenous contrast. Multiplanar CT image reconstructions and MIPs were obtained to evaluate the vascular anatomy. Carotid stenosis measurements (when applicable) are obtained utilizing NASCET criteria, using the distal internal carotid diameter as the denominator. CONTRAST:  50 cc Isovue 370. This decision was made by the neurologist based on risk benefit ratio and the fact the patient cannot have an MR scan because of pacemaker. COMPARISON:  06/29/2016 head CT. FINDINGS: CT HEAD Brain: Small vessel disease changes without CT evidence of large acute infarct. No intracranial mass or abnormal enhancement. Global mild atrophy without hydrocephalus. Calvarium and skull base: Negative. Paranasal sinuses: Minimal left maxillary sinus mucosal thickening. Orbits: No acute abnormality. CTA NECK Aortic arch: Calcified plaque without high-grade stenosis. Three vessel arch. Right carotid system: Plaque proximal right internal carotid artery with greater than 75% diameter stenosis. Left  carotid system: Ectatic left carotid artery with bifurcation posterior to the pharynx. No hemodynamically significant stenosis of the left internal carotid artery. Vertebral arteries:Mild narrowing origin of vertebral arteries bilaterally. Left vertebral artery dominant in size. Skeleton: Multilevel cervical spondylotic changes with various degrees of spinal stenosis and foraminal narrowing. Limbus vertebra C7/protrusion with marked canal narrowing. Other neck: Bilateral pleural effusions greater on right. Lung parenchymal changes partially reflective of pulmonary vascular congestion however, ground-glass opacities throughout the right lung apex including 1.4 cm rounded consolidation. This appearance is atypical for congestive heart. Recommend follow-up chest CT in 3 months after treatment of any congestive heart failure or infectious infiltrate. CTA HEAD Anterior circulation: Prominent calcification cavernous segment internal carotid artery bilaterally with moderate to marked narrowing. Mild narrowing left middle cerebral artery M1 segment. Moderate narrowing right middle cerebral artery M1 segment. Moderate to marked narrowing A1 segment right anterior cerebral artery. Posterior circulation: Left vertebral artery is dominant. Moderate to marked narrowing vertebral arteries at the level of the foramen magnum. Mild narrowing and irregularity basilar artery. Moderate narrowing proximal mid and distal aspect posterior cerebral artery bilaterally. Venous sinuses: Patent. Anatomic variants: Negative. Delayed phase: Negative IMPRESSION: CT Small vessel disease changes without CT evidence of large acute infarct. No intracranial mass or abnormal enhancement. Global mild atrophy without hydrocephalus. CTA NECK Aortic atherosclerosis. Plaque proximal right internal carotid artery with greater than 75% diameter stenosis. Ectatic left carotid artery with bifurcation posterior to the pharynx. No hemodynamically significant  stenosis of the left internal carotid artery. Mild narrowing origin of vertebral arteries bilaterally. Left vertebral artery dominant in size. Multilevel cervical spondylotic changes with various degrees of spinal stenosis and foraminal narrowing. Limbus vertebra C7/protrusion with marked canal narrowing. Bilateral pleural effusions greater on right. Lung parenchymal changes partially reflective of pulmonary vascular congestion however, ground-glass opacities throughout the right lung apex including 1.4 cm rounded consolidation. This appearance is atypical for congestive heart. Recommend follow-up chest CT in 3 months after treatment of any congestive heart failure or infectious infiltrate. CTA HEAD Prominent calcification cavernous segment internal carotid artery bilaterally with moderate to marked narrowing. Mild narrowing left middle cerebral artery M1 segment. Moderate narrowing right middle cerebral artery M1 segment. Moderate to marked narrowing A1 segment  right anterior cerebral artery. Left vertebral artery is dominant. Moderate to marked narrowing vertebral arteries at the level of the foramen magnum. Mild narrowing and irregularity basilar artery. Moderate narrowing proximal mid and distal aspect posterior cerebral artery bilaterally. These results were called by telephone at the time of interpretation on 06/29/2016 at 5:43 pm to Dr. Derwood Kaplan , who verbally acknowledged these results. Electronically Signed   By: Lacy Duverney M.D.   On: 06/29/2016 18:31   Dg Chest Port 1 View  06/29/2016  CLINICAL DATA:  Right-sided weakness and aphasia EXAM: PORTABLE CHEST 1 VIEW COMPARISON:  07/05/2015 FINDINGS: Pacing device is again identified and stable. Cardiomegaly is again noted. Aortic calcifications are seen and stable. The lungs are well aerated bilaterally. Mild congestive failure is again noted. No focal infiltrate is seen. IMPRESSION: Mild CHF. Aortic atherosclerosis. Electronically Signed   By: Alcide Clever M.D.   On: 06/29/2016 18:30   Ct Head Code Stroke W/o Cm  06/29/2016  CLINICAL DATA:  Code stroke.  Right-sided weakness and facial droop. EXAM: CT HEAD WITHOUT CONTRAST TECHNIQUE: Contiguous axial images were obtained from the base of the skull through the vertex without intravenous contrast. COMPARISON:  05/03/2005 FINDINGS: Brain: No evidence of cortical infarction, hemorrhage, hydrocephalus, or mass lesion/mass effect. Patchy white matter ischemic gliosis that has progressed since previous, overall mild for age. There have been lacunar infarcts in the bilateral thalamus. Dilated perivascular space below the right putamen. Aspects is 10. Vascular: No hyperdense vessel. Calcified intracranial atherosclerosis Skull: Negative for fracture or focal lesion. Sinuses/Orbits: No acute finding. Other: None. Attempt to call at the time of interpretation on 06/29/2016 at 5:29 and 5:34 pm to Dr. Nicholas Lose, but cell phone not working [phone]). These results were called by telephone at the time of interpretation on 06/29/2016 at 5:36 pm to Dr. Tyrone Apple , who verbally acknowledged these results. IMPRESSION: 1. No intracranial hemorrhage or definite acute infarct. 2. Chronic microvascular disease and mild for age atrophy. Electronically Signed   By: Marnee Spring M.D.   On: 06/29/2016 17:37  Echocardiogram 05/09/2015  Indications: Syncope 780.2.  ------------------------------------------------------------------- History: PMH: Anemia. Coronary artery disease. PMH: Myocardial infarction. Risk factors: Hypertension. Diabetes mellitus. Dyslipidemia.  ------------------------------------------------------------------- Study Conclusions  - Left ventricle: Endocardial segments were not well visualized and  many of the images were take off access but there appears to be  distal septal, distal anterior and distal inferior and apical  akinesis. The cavity size was normal. Systolic  function was  mildly to moderately reduced. The estimated ejection fraction was  in the range of 40% to 45%. - Aortic valve: Severe thickening and calcification, consistent  with sclerosis. - Mitral valve: There was moderate regurgitation. - Left atrium: The atrium was mildly dilated. - Pulmonary arteries: PA peak pressure: 72 mm Hg (S).  Impressions:  - The right ventricular systolic pressure was increased consistent  with severe pulmonary hypertension.  EKG: Independently reviewed. Vent. rate 87 BPM PR interval * ms QRS duration 186 ms QT/QTc 455/548 ms P-R-T axes 86 -83 98 Sinus rhythm IVCD, consider atypical RBBB LVH with secondary repolarization abnormality Inferior infarct, acute (RCA) and anterior ST elevation Lateral leads are also involved Compared to old - there are ST depression and T wave inversion in the anterior leads  Assessment/Plan Principal Problem:   Acute CVA (cerebrovascular accident) (HCC) Admit to stepdown/inpatient. Frequent neuro checks. Check echocardiogram and carotid Doppler. Antiplatelet therapy with aspirin and Plavix once patient passes swallow test. PT/OT evaluation. Neurology  input appreciated.  Active Problems:   Hyperlipidemia Continue lifestyle modifications.    CAD S/P PTCA 1995 Continue aspirin, atorvastatin and carvedilol once cleared for oral intake.    Intrinsic asthma Continue supplemental oxygen and BiPAP ventilation. Bronchodilators as needed.    Chronic combined systolic and diastolic heart failure (HCC) Continue furosemide, carvedilol, nitrates and hydralazine. Check echocardiogram.    Type 2 diabetes mellitus with renal manifestations (HCC) Resume carbohydrate modified eye once patient's swallow passes swallow status.    Essential hypertension Continue carvedilo, hydralazine l and furosemide once cleared for oral intake.    AKI (acute kidney injury) (HCC) Decreased dose of furosemide and switched to IV  until the patient is clear for oral intake.   DVT prophylaxis: Lovenox. Code Status: Full code. Family Communication:  Disposition Plan: Admit for CVA workup and treatment. Consults called: *Neuro hospitalist team. Admission status: Stepdown/inpatient.   Bobette Mo MD [facility] Pager [phone].  If 7PM-7AM, please contact night-coverage [email or web address] Password [email or web address]  06/29/2016, 7:41 PM

## 2016-06-30 DIAGNOSIS — J9601 Acute respiratory failure with hypoxia: Secondary | ICD-10-CM

## 2016-06-30 DIAGNOSIS — N189 Chronic kidney disease, unspecified: Secondary | ICD-10-CM

## 2016-06-30 LAB — LIPID PANEL
CHOL/HDL RATIO: 3.2 ratio
CHOLESTEROL: 156 mg/dL (ref 0–200)
HDL: 49 mg/dL (ref >40)
LDL CALC: 83 mg/dL (ref 0–99)
Triglycerides: 120 mg/dL (ref <150)
VLDL: 24 mg/dL (ref 0–40)

## 2016-06-30 LAB — CBC WITH DIFFERENTIAL/PLATELET
BASOS ABS: 0 10*3/uL (ref 0.0–0.1)
BASOS PCT: 0 %
EOS PCT: 1 %
Eosinophils Absolute: 0.1 10*3/uL (ref 0.0–0.7)
HCT: 33.5 % — ABNORMAL LOW (ref 36.0–46.0)
Hemoglobin: 10 g/dL — ABNORMAL LOW (ref 12.0–15.0)
Lymphocytes Relative: 15 %
Lymphs Abs: 0.9 10*3/uL (ref 0.7–4.0)
MCH: 29.5 pg (ref 26.0–34.0)
MCHC: 29.9 g/dL — AB (ref 30.0–36.0)
MCV: 98.8 fL (ref 78.0–100.0)
MONO ABS: 0.8 10*3/uL (ref 0.1–1.0)
MONOS PCT: 12 %
Neutro Abs: 4.5 10*3/uL (ref 1.7–7.7)
Neutrophils Relative %: 72 %
PLATELETS: 212 10*3/uL (ref 150–400)
RBC: 3.39 MIL/uL — ABNORMAL LOW (ref 3.87–5.11)
RDW: 15.8 % — AB (ref 11.5–15.5)
WBC: 6.2 10*3/uL (ref 4.0–10.5)

## 2016-06-30 LAB — BASIC METABOLIC PANEL
ANION GAP: 13 (ref 5–15)
BUN: 64 mg/dL — ABNORMAL HIGH (ref 6–20)
CALCIUM: 10.2 mg/dL (ref 8.9–10.3)
CO2: 29 mmol/L (ref 22–32)
CREATININE: 5.29 mg/dL — AB (ref 0.44–1.00)
Chloride: 100 mmol/L — ABNORMAL LOW (ref 101–111)
GFR, EST AFRICAN AMERICAN: 8 mL/min — AB (ref >60)
GFR, EST NON AFRICAN AMERICAN: 7 mL/min — AB (ref >60)
GLUCOSE: 80 mg/dL (ref 65–99)
Potassium: 4 mmol/L (ref 3.5–5.1)
Sodium: 142 mmol/L (ref 135–145)

## 2016-06-30 LAB — GLUCOSE, CAPILLARY
GLUCOSE-CAPILLARY: 114 mg/dL — AB (ref 65–99)
GLUCOSE-CAPILLARY: 156 mg/dL — AB (ref 65–99)
Glucose-Capillary: 108 mg/dL — ABNORMAL HIGH (ref 65–99)

## 2016-06-30 LAB — MAGNESIUM: Magnesium: 2.6 mg/dL — ABNORMAL HIGH (ref 1.7–2.4)

## 2016-06-30 MED ORDER — ALBUTEROL SULFATE (2.5 MG/3ML) 0.083% IN NEBU: 2.5000 mg | INHALATION_SOLUTION | RESPIRATORY_TRACT | Status: DC | PRN

## 2016-06-30 MED ORDER — ISOSORBIDE MONONITRATE ER 30 MG PO TB24
30.0000 mg | ORAL_TABLET | Freq: Every day | ORAL | Status: DC
  Administered 2016-06-30 – 2016-07-04 (×5): 30 mg via ORAL
  Filled 2016-06-30 (×5): qty 1

## 2016-06-30 MED ORDER — HYDRALAZINE HCL 10 MG PO TABS
10.0000 mg | ORAL_TABLET | Freq: Three times a day (TID) | ORAL | Status: DC
Start: 2016-06-30 — End: 2016-07-04
  Administered 2016-06-30 – 2016-07-04 (×14): 10 mg via ORAL
  Filled 2016-06-30 (×14): qty 1

## 2016-06-30 MED ORDER — LORATADINE 10 MG PO TABS: 10.0000 mg | ORAL_TABLET | Freq: Every day | ORAL | Status: DC | PRN

## 2016-06-30 MED ORDER — FUROSEMIDE 10 MG/ML IJ SOLN
20.0000 mg | Freq: Two times a day (BID) | INTRAMUSCULAR | Status: DC
  Administered 2016-06-30 (×2): 20 mg via INTRAVENOUS
  Filled 2016-06-30 (×2): qty 2

## 2016-06-30 MED ORDER — NITROGLYCERIN 0.4 MG SL SUBL: 0.4000 mg | SUBLINGUAL_TABLET | SUBLINGUAL | Status: DC | PRN

## 2016-06-30 MED ORDER — ATORVASTATIN CALCIUM 40 MG PO TABS
40.0000 mg | ORAL_TABLET | Freq: Every morning | ORAL | Status: DC
  Administered 2016-06-30 – 2016-07-02 (×3): 40 mg via ORAL
  Filled 2016-06-30 (×3): qty 1

## 2016-06-30 MED ORDER — ASPIRIN EC 325 MG PO TBEC
325.0000 mg | DELAYED_RELEASE_TABLET | Freq: Every day | ORAL | Status: DC
  Administered 2016-06-30: 325 mg via ORAL
  Filled 2016-06-30: qty 1

## 2016-06-30 MED ORDER — PARICALCITOL 1 MCG PO CAPS
1.0000 ug | ORAL_CAPSULE | Freq: Every day | ORAL | Status: DC
  Administered 2016-06-30 – 2016-07-04 (×5): 1 ug via ORAL
  Filled 2016-06-30 (×5): qty 1

## 2016-06-30 MED ORDER — FUROSEMIDE 10 MG/ML IJ SOLN
60.0000 mg | Freq: Two times a day (BID) | INTRAMUSCULAR | Status: DC
  Administered 2016-06-30 – 2016-07-03 (×6): 60 mg via INTRAVENOUS
  Filled 2016-06-30 (×2): qty 8
  Filled 2016-06-30 (×4): qty 6

## 2016-06-30 MED ORDER — CLOPIDOGREL BISULFATE 75 MG PO TABS
75.0000 mg | ORAL_TABLET | Freq: Every day | ORAL | Status: DC
  Administered 2016-06-30 – 2016-07-04 (×5): 75 mg via ORAL
  Filled 2016-06-30 (×5): qty 1

## 2016-06-30 MED ORDER — INSULIN ASPART 100 UNIT/ML ~~LOC~~ SOLN
0.0000 [IU] | SUBCUTANEOUS | Status: DC
  Administered 2016-06-30: 2 [IU] via SUBCUTANEOUS

## 2016-06-30 MED ORDER — CLOPIDOGREL BISULFATE 75 MG PO TABS: 75.0000 mg | ORAL_TABLET | Freq: Every day | ORAL | Status: DC

## 2016-06-30 NOTE — Progress Notes (Addendum)
PROGRESS NOTE  RIDLEY DILEO  ZOX:[id number] DOB: [birth year]  DOA: 06/29/2016 PCP: Eliott Nine, MD  Primary Nephrologist: Dr. Elvis Coil   Brief Narrative:  82 y.o. female with medical history significant of hyperlipidemia, hypertension, DM 2, multivessel CAD, chronic combined systolic and diastolic heart failure, permanent pacemaker for high degree AV block, PVD,obesity, intrinsic asthma, stage V chronic kidney disease was brought to the emergency department after she was found by her daughter on the front porch with right-sided facial droop, drooling and slurred speech and right-sided upper extremity paresis. Code stroke was activated. Imaging does not show acute stroke. Admitted to stepdown due to acute respiratory failure, on BiPAP.   Assessment & Plan:   Principal Problem:   Acute CVA (cerebrovascular accident) (HCC) Active Problems:   Hyperlipidemia   CAD S/P PTCA 1995   Intrinsic asthma   Chronic combined systolic and diastolic heart failure (HCC)   Type 2 diabetes mellitus with renal manifestations (HCC)   Essential hypertension   AKI (acute kidney injury) (HCC)   Suspected acute left CVA  - Residual dysarthria, facial asymmetry and right hemiparesis. - CTA head 06/29/16: Small vessel disease changes without CT evidence of large acute infarct. - CTA head and neck: Moderate to marked narrowing bilateral ICA - Rest of stroke workup: 2-D echo, A1c: pending. - LDL 83. - On aspirin 81 MG daily prior to admission, now on aspirin and Plavix for secondary stroke prophylaxis. - Neurology consultation appreciated. Await stroke team follow-up. - PT, OT and ST evaluation.  Addendum Discussed with Dr. Pearlean Brownie, Neurology: continue Plavix alone and DC ASA.  Acute respiratory failure with hypoxia - Precipitated by acute on chronic combined CHF, underlying severe pulmonary hypertension and asthma. - Admitted to stepdown and placed on BiPAP. Started IV Lasix. - 2-D echo 05/09/15:  LVEF 40-45 percent, aortic sclerosis, moderate MR, PA peak pressures 72 mmHg. - -275 ML since admission. - Treat underlying cause and titrate oxygen to maintain saturations >92%. BiPAP when necessary.  Acute on chronic combined CHF - Continue IV Lasix and nitrates. Check repeat 2-D echo. Some of volume overload related to advanced chronic kidney disease but probably not HD candidate. - Hold carvedilol secondary to acute CHF.  Acute on stage V chronic kidney disease - Last creatinine prior to this admission on 07/05/15:3.39. Admitted with creatinine of 5.19 > 5.29. Either has acute kidney injury or progression of chronic kidney disease. Likely not candidate for HD - Did receive contrast for CTA head and neck on 6/30. - Follow BMP. Consult nephrology.  Hyperlipidemia Check fasting lipids  CTA status post PTCA 1995 - Continue aspirin and statins   Asthma - Bronchodilators. No clinical bronchospasm or features suggestive of infectious exacerbation.   DM 2 with renal manifestations - Diabetic diet. SSI.  Essential hypertension - Continue hydralazine and IV Lasix. Hold carvedilol.    DVT prophylaxis: Lovenox Code Status: Full Family Communication: Discussed with patient. No family at bedside.  Disposition Plan: DC home when medically stable.   Consultants:   Neurology   Nephrology  Procedures:   BIPAP  Antimicrobials:   None    Subjective: Seen this morning with patient's RN at bedside. BiPAP removed. States that she feels better with improvement in dyspnea. Denies cough or chest pain or pain elsewhere.   Objective:  Filed Vitals:   06/30/16 0500 06/30/16 0600 06/30/16 0700 06/30/16 0828  BP: 159/75 163/79 136/56   Pulse: 62 65 58   Temp:      TempSrc:  Resp: 15 16 16    Height:      Weight:      SpO2: 100% 100% 100% 97%    Intake/Output Summary (Last 24 hours) at 06/30/16 0903 Last data filed at 06/30/16 0400  Gross per 24 hour  Intake      0 ml    Output    275 ml  Net   -275 ml   Filed Weights   06/29/16 1757 06/29/16 2300 06/30/16 0400  Weight: 77.7 kg (171 lb 4.8 oz) 78.3 kg (172 lb 9.9 oz) 78.1 kg (172 lb 2.9 oz)    Examination:  General exam: Pleasant elderly female, lying comfortably propped up in bed and on BiPAP this morning.  Respiratory system: Occasional basal crackles but otherwise clear to auscultation without wheezing, rhonchi. [name]. Respiratory effort normal. Cardiovascular system: S1 & S2 heard, RRR. No JVD, murmurs, rubs, gallops or clicks. 1+ pedal edema. Telemetry: AV paced rhythm.  Gastrointestinal system: Abdomen is nondistended, soft and nontender. No organomegaly or masses felt. Normal bowel sounds heard. Central nervous system: Alert and oriented. Dysarthria, facial asymmetry  Extremities: grade 4+ x 5 power at least in left limbs. Right pronator drift. Right upper extremity 4 x 5 power in right lower extremity 2 x 5 power.  Skin: No rashes, lesions or ulcers Psychiatry: Judgement and insight appear normal. Mood & affect appropriate.     Data Reviewed: I have personally reviewed following labs and imaging studies  CBC:  Recent Labs Lab 06/29/16 1711 06/29/16 1712 06/30/16 0731  WBC 5.7  --  6.2  NEUTROABS 4.1  --  4.5  HGB 10.0* 11.2* 10.0*  HCT 32.0* 33.0* 33.5*  MCV 98.2  --  98.8  PLT 217  --  212   Basic Metabolic Panel:  Recent Labs Lab 06/29/16 1711 06/29/16 1712 06/30/16 0731  NA 137 139 142  K 4.2 4.2 4.0  CL 99* 99* 100*  CO2 26  --  29  GLUCOSE 170* 165* 80  BUN 65* 61* 64*  CREATININE 5.19* 5.20* 5.29*  CALCIUM 10.4*  --  10.2  MG  --   --  2.6*   GFR: Estimated Creatinine Clearance: 7.4 mL/min (by C-G formula based on Cr of 5.29). Liver Function Tests:  Recent Labs Lab 06/29/16 1711  AST 26  ALT 18  ALKPHOS 56  BILITOT 1.0  PROT 7.2  ALBUMIN 3.8   No results for input(s): LIPASE, AMYLASE in the last 168 hours. No results for input(s): AMMONIA in the last 168  hours. Coagulation Profile:  Recent Labs Lab 06/29/16 1711  INR 1.21   Cardiac Enzymes: No results for input(s): CKTOTAL, CKMB, CKMBINDEX, TROPONINI in the last 168 hours. BNP (last 3 results) No results for input(s): PROBNP in the last 8760 hours. HbA1C: No results for input(s): HGBA1C in the last 72 hours. CBG: No results for input(s): GLUCAP in the last 168 hours. Lipid Profile:  Recent Labs  06/30/16 0731  CHOL 156  HDL 49  LDLCALC 83  TRIG 120  CHOLHDL 3.2   Thyroid Function Tests: No results for input(s): TSH, T4TOTAL, FREET4, T3FREE, THYROIDAB in the last 72 hours. Anemia Panel: No results for input(s): VITAMINB12, FOLATE, FERRITIN, TIBC, IRON, RETICCTPCT in the last 72 hours.  Sepsis Labs: No results for input(s): PROCALCITON, LATICACIDVEN in the last 168 hours.  No results found for this or any previous visit (from the past 240 hour(s)).       Radiology Studies: Ct Angio Head W Or  Wo Contrast  06/29/2016  CLINICAL DATA:  82 year old female with right-sided weakness and facial droop. EXAM: CT ANGIOGRAPHY HEAD AND NECK TECHNIQUE: Multidetector CT imaging of the head and neck was performed using the standard protocol during bolus administration of intravenous contrast. Multiplanar CT image reconstructions and MIPs were obtained to evaluate the vascular anatomy. Carotid stenosis measurements (when applicable) are obtained utilizing NASCET criteria, using the distal internal carotid diameter as the denominator. CONTRAST:  50 cc Isovue 370. This decision was made by the neurologist based on risk benefit ratio and the fact the patient cannot have an MR scan because of pacemaker. COMPARISON:  06/29/2016 head CT. FINDINGS: CT HEAD Brain: Small vessel disease changes without CT evidence of large acute infarct. No intracranial mass or abnormal enhancement. Global mild atrophy without hydrocephalus. Calvarium and skull base: Negative. Paranasal sinuses: Minimal left maxillary  sinus mucosal thickening. Orbits: No acute abnormality. CTA NECK Aortic arch: Calcified plaque without high-grade stenosis. Three vessel arch. Right carotid system: Plaque proximal right internal carotid artery with greater than 75% diameter stenosis. Left carotid system: Ectatic left carotid artery with bifurcation posterior to the pharynx. No hemodynamically significant stenosis of the left internal carotid artery. Vertebral arteries:Mild narrowing origin of vertebral arteries bilaterally. Left vertebral artery dominant in size. Skeleton: Multilevel cervical spondylotic changes with various degrees of spinal stenosis and foraminal narrowing. Limbus vertebra C7/protrusion with marked canal narrowing. Other neck: Bilateral pleural effusions greater on right. Lung parenchymal changes partially reflective of pulmonary vascular congestion however, ground-glass opacities throughout the right lung apex including 1.4 cm rounded consolidation. This appearance is atypical for congestive heart. Recommend follow-up chest CT in 3 months after treatment of any congestive heart failure or infectious infiltrate. CTA HEAD Anterior circulation: Prominent calcification cavernous segment internal carotid artery bilaterally with moderate to marked narrowing. Mild narrowing left middle cerebral artery M1 segment. Moderate narrowing right middle cerebral artery M1 segment. Moderate to marked narrowing A1 segment right anterior cerebral artery. Posterior circulation: Left vertebral artery is dominant. Moderate to marked narrowing vertebral arteries at the level of the foramen magnum. Mild narrowing and irregularity basilar artery. Moderate narrowing proximal mid and distal aspect posterior cerebral artery bilaterally. Venous sinuses: Patent. Anatomic variants: Negative. Delayed phase: Negative IMPRESSION: CT Small vessel disease changes without CT evidence of large acute infarct. No intracranial mass or abnormal enhancement. Global mild  atrophy without hydrocephalus. CTA NECK Aortic atherosclerosis. Plaque proximal right internal carotid artery with greater than 75% diameter stenosis. Ectatic left carotid artery with bifurcation posterior to the pharynx. No hemodynamically significant stenosis of the left internal carotid artery. Mild narrowing origin of vertebral arteries bilaterally. Left vertebral artery dominant in size. Multilevel cervical spondylotic changes with various degrees of spinal stenosis and foraminal narrowing. Limbus vertebra C7/protrusion with marked canal narrowing. Bilateral pleural effusions greater on right. Lung parenchymal changes partially reflective of pulmonary vascular congestion however, ground-glass opacities throughout the right lung apex including 1.4 cm rounded consolidation. This appearance is atypical for congestive heart. Recommend follow-up chest CT in 3 months after treatment of any congestive heart failure or infectious infiltrate. CTA HEAD Prominent calcification cavernous segment internal carotid artery bilaterally with moderate to marked narrowing. Mild narrowing left middle cerebral artery M1 segment. Moderate narrowing right middle cerebral artery M1 segment. Moderate to marked narrowing A1 segment right anterior cerebral artery. Left vertebral artery is dominant. Moderate to marked narrowing vertebral arteries at the level of the foramen magnum. Mild narrowing and irregularity basilar artery. Moderate narrowing proximal mid and distal  aspect posterior cerebral artery bilaterally. These results were called by telephone at the time of interpretation on 06/29/2016 at 5:43 pm to Dr. Derwood Kaplan , who verbally acknowledged these results. Electronically Signed   By: Lacy Duverney M.D.   On: 06/29/2016 18:31   Ct Angio Neck W Or Wo Contrast  06/29/2016  CLINICAL DATA:  82 year old female with right-sided weakness and facial droop. EXAM: CT ANGIOGRAPHY HEAD AND NECK TECHNIQUE: Multidetector CT imaging of  the head and neck was performed using the standard protocol during bolus administration of intravenous contrast. Multiplanar CT image reconstructions and MIPs were obtained to evaluate the vascular anatomy. Carotid stenosis measurements (when applicable) are obtained utilizing NASCET criteria, using the distal internal carotid diameter as the denominator. CONTRAST:  50 cc Isovue 370. This decision was made by the neurologist based on risk benefit ratio and the fact the patient cannot have an MR scan because of pacemaker. COMPARISON:  06/29/2016 head CT. FINDINGS: CT HEAD Brain: Small vessel disease changes without CT evidence of large acute infarct. No intracranial mass or abnormal enhancement. Global mild atrophy without hydrocephalus. Calvarium and skull base: Negative. Paranasal sinuses: Minimal left maxillary sinus mucosal thickening. Orbits: No acute abnormality. CTA NECK Aortic arch: Calcified plaque without high-grade stenosis. Three vessel arch. Right carotid system: Plaque proximal right internal carotid artery with greater than 75% diameter stenosis. Left carotid system: Ectatic left carotid artery with bifurcation posterior to the pharynx. No hemodynamically significant stenosis of the left internal carotid artery. Vertebral arteries:Mild narrowing origin of vertebral arteries bilaterally. Left vertebral artery dominant in size. Skeleton: Multilevel cervical spondylotic changes with various degrees of spinal stenosis and foraminal narrowing. Limbus vertebra C7/protrusion with marked canal narrowing. Other neck: Bilateral pleural effusions greater on right. Lung parenchymal changes partially reflective of pulmonary vascular congestion however, ground-glass opacities throughout the right lung apex including 1.4 cm rounded consolidation. This appearance is atypical for congestive heart. Recommend follow-up chest CT in 3 months after treatment of any congestive heart failure or infectious infiltrate. CTA HEAD  Anterior circulation: Prominent calcification cavernous segment internal carotid artery bilaterally with moderate to marked narrowing. Mild narrowing left middle cerebral artery M1 segment. Moderate narrowing right middle cerebral artery M1 segment. Moderate to marked narrowing A1 segment right anterior cerebral artery. Posterior circulation: Left vertebral artery is dominant. Moderate to marked narrowing vertebral arteries at the level of the foramen magnum. Mild narrowing and irregularity basilar artery. Moderate narrowing proximal mid and distal aspect posterior cerebral artery bilaterally. Venous sinuses: Patent. Anatomic variants: Negative. Delayed phase: Negative IMPRESSION: CT Small vessel disease changes without CT evidence of large acute infarct. No intracranial mass or abnormal enhancement. Global mild atrophy without hydrocephalus. CTA NECK Aortic atherosclerosis. Plaque proximal right internal carotid artery with greater than 75% diameter stenosis. Ectatic left carotid artery with bifurcation posterior to the pharynx. No hemodynamically significant stenosis of the left internal carotid artery. Mild narrowing origin of vertebral arteries bilaterally. Left vertebral artery dominant in size. Multilevel cervical spondylotic changes with various degrees of spinal stenosis and foraminal narrowing. Limbus vertebra C7/protrusion with marked canal narrowing. Bilateral pleural effusions greater on right. Lung parenchymal changes partially reflective of pulmonary vascular congestion however, ground-glass opacities throughout the right lung apex including 1.4 cm rounded consolidation. This appearance is atypical for congestive heart. Recommend follow-up chest CT in 3 months after treatment of any congestive heart failure or infectious infiltrate. CTA HEAD Prominent calcification cavernous segment internal carotid artery bilaterally with moderate to marked narrowing. Mild narrowing left middle  cerebral artery M1  segment. Moderate narrowing right middle cerebral artery M1 segment. Moderate to marked narrowing A1 segment right anterior cerebral artery. Left vertebral artery is dominant. Moderate to marked narrowing vertebral arteries at the level of the foramen magnum. Mild narrowing and irregularity basilar artery. Moderate narrowing proximal mid and distal aspect posterior cerebral artery bilaterally. These results were called by telephone at the time of interpretation on 06/29/2016 at 5:43 pm to Dr. Derwood KaplanANKIT NANAVATI , who verbally acknowledged these results. Electronically Signed   By: Lacy DuverneySteven  Olson M.D.   On: 06/29/2016 18:31   Dg Chest Port 1 View  06/29/2016  CLINICAL DATA:  Right-sided weakness and aphasia EXAM: PORTABLE CHEST 1 VIEW COMPARISON:  07/05/2015 FINDINGS: Pacing device is again identified and stable. Cardiomegaly is again noted. Aortic calcifications are seen and stable. The lungs are well aerated bilaterally. Mild congestive failure is again noted. No focal infiltrate is seen. IMPRESSION: Mild CHF. Aortic atherosclerosis. Electronically Signed   By: Alcide CleverMark  Lukens M.D.   On: 06/29/2016 18:30   Ct Head Code Stroke W/o Cm  06/29/2016  CLINICAL DATA:  Code stroke.  Right-sided weakness and facial droop. EXAM: CT HEAD WITHOUT CONTRAST TECHNIQUE: Contiguous axial images were obtained from the base of the skull through the vertex without intravenous contrast. COMPARISON:  05/03/2005 FINDINGS: Brain: No evidence of cortical infarction, hemorrhage, hydrocephalus, or mass lesion/mass effect. Patchy white matter ischemic gliosis that has progressed since previous, overall mild for age. There have been lacunar infarcts in the bilateral thalamus. Dilated perivascular space below the right putamen. Aspects is 10. Vascular: No hyperdense vessel. Calcified intracranial atherosclerosis Skull: Negative for fracture or focal lesion. Sinuses/Orbits: No acute finding. Other: None. Attempt to call at the time of  interpretation on 06/29/2016 at 5:29 and 5:34 pm to Dr. Nicholas LoseEshraghi, but cell phone not working [phone]). These results were called by telephone at the time of interpretation on 06/29/2016 at 5:36 pm to Dr. Tyrone AppleEMILY NGUYEN , who verbally acknowledged these results. IMPRESSION: 1. No intracranial hemorrhage or definite acute infarct. 2. Chronic microvascular disease and mild for age atrophy. Electronically Signed   By: Marnee SpringJonathon  Watts M.D.   On: 06/29/2016 17:37        Scheduled Meds: . aspirin EC  325 mg Oral Daily  . clopidogrel  75 mg Oral Daily  . enoxaparin (LOVENOX) injection  30 mg Subcutaneous Q24H  . furosemide  20 mg Intravenous BID   Continuous Infusions:    LOS: 1 day    Time spent: 45 minutes.    [facility] CambridgeNGALGI,Maysun Meditz, MD [facility] Pager [phone]  If 7PM-7AM, please contact night-coverage [email or web address] Password [email or web address] 06/30/2016, 9:03 AM

## 2016-06-30 NOTE — Progress Notes (Signed)
Rehab Admissions Coordinator Note:  Patient was screened by Trish MageLogue, Min Collymore M for appropriateness for an Inpatient Acute Rehab Consult.  At this time, we are recommending Inpatient Rehab consult provided that it can be demonstrated that patient has suffered a neurological event such as a stroke.  We would also need insurance approval prior to inpatient rehab admission.        Trish MageLogue, Threasa Kinch M 06/30/2016, 7:36 PM  I can be reached at [phone]

## 2016-06-30 NOTE — Procedures (Signed)
Attempted x2 to obtain arterial blood gas. Unable to get a valid  specimen at this time.

## 2016-06-30 NOTE — Consult Note (Signed)
Renal Service Consult Note [facility]  TABATHA RAZZANO 06/30/2016 Rosio Weiss D Requesting Physician:  Dr. Algis Liming  Reason for Consult:  Acute on CRF vs progression of CRF HPI: The patient is a 82 y.o. year-old with history of HL, HTN, CADhx PTCA '95/ hx MI, PVD hx R fem-pop '15, CHB sp PPM '15, CKD stage 4/5, anemia and DM2.  Patient was found yesterday on her porch with R sided facial droop, drooling and w difficulty speaking and unable to use her R hand and arm.  In ED also was in resp distress. CTA done in ED showed no large lesion for intervention. Pt admitted w dx of acute CVA.  No hx CVA.  She has known CKD and is f/b CKA.   Patient diuresed not much on 20 mg IV lasix overnight, about 600 ml total, but nevertheless her breathing is sig better today.  She has no c/o's, no recent problems with uremic symptoms such as somnolence, fatigue, anorexia or nausea/ vomiting.  Appetite has been good.       Date   Creat  eGFR 2009   1.88 2015  2.8- 3.5 11- 14 2016  3.4- 5.4 6- 11 06/29/16  5.20 06/30/16  5.29  7  ECHO May '16 - LVEF 40-45%, severe pulm HTN, focal WMA of septal/ distal ant and distal inf/ apical walls CXR - mild CHF, looking back started having CHF by CXR in 2015.    ROS  denies CP  no joint pain   no HA  no blurry vision  no rash  no diarrhea  no nausea/ vomiting  no dysuria  no difficulty voiding   Past Medical History  Past Medical History  Diagnosis Date  . Hyperlipidemia   . Carotid stenosis     Carotid US (12/2013): < 74% RCA, < 94% LICA.  [name] Hypertension   . Coronary artery disease     a. s/p MI 1995 tx w/ PTCA to Dx, Ramus, ap LAD;  b. LHC (11/2004):  Inferior HK, EF 60%, proximal LAD 50%, ostial D1 80%, mid D1 95%, mid LAD 50-60%, distal LAD at the apex 90%, OM1 occluded (fills late by left to left collaterals), RCA 75%. Medical therapy recommended.;  c. Lexiscan Myoview (12/2013):  Prior ant-lat scar with very small area of peri-infarct  ischemia in ant wall, EF 40 (high risk)   . Obesity (BMI 30-39.9)   . Intrinsic asthma 01/28/2008     Mild intermittant asthma, PFT's 01/28/08     . Old anterolateral myocardial infarction   . PVD (peripheral vascular disease) ([place]) 02/16/2014    2015, right femoral peroneal bypass grafting and left femoral-tibial bypass grafting by Dr. Donnetta Hutching for rest pain as well as nonhealing ulcer on toe accompanied with toe amputation  Arteriogram October 2015 with patent graft on the right, 70% stenosis of graft on the left proximally.   . Chronic combined systolic and diastolic heart failure ([place]) 04/13/2014  . Complete heart block ([place])     a. 05/2015 s/p [facility]: [employer] Rhonda W Assurity DR model [id number] dual chamber PPM (serial number [id number]) (Dr. Rayann Heman).  [name] Hx of echocardiogram     a. Echo 5/16: dist septal, dist ant, dist inf and apical AK, EF 40-45%, mod MR, mild LAE, PASP 72  . Presence of permanent cardiac pacemaker   . Chronic kidney disease (CKD), stage V ([place])     notes 07/03/2015; Dr. Justin Mend  . 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  Type 2 diabetes mellitus with renal manifestations [facility])    Past Surgical History  Past Surgical History  Procedure Laterality Date  . Repair of nerve in left arm  Left 2000  . Cardiac catheterization  1995    w/ PTCA Diag 2nd MI  . Femoral-tibial bypass graft Left 02/24/2014    Procedure: BYPASS GRAFT FEMORAL-TIBIAL ARTERY;  Surgeon: Rosetta Posner, MD;  Location: [place];  Service: Vascular;  Laterality: Left;  . Femoral-popliteal bypass graft Right 08/04/2014    Procedure: RIGHT FEMORAL-PERONEAL ARTERY BYPASS GRAFT;  Surgeon: Rosetta Posner, MD;  Location: [place];  Service: Vascular;  Laterality: Right;  . Amputation Right 08/04/2014    Procedure: AMPUTATION DIGIT-RIGHT 3RD TOE;  Surgeon: Rosetta Posner, MD;  Location: [place];  Service: Vascular;  Laterality: Right;  . Lower extremity angiogram Left 01/08/2014    Procedure: LOWER EXTREMITY  ANGIOGRAM;  Surgeon: Elam Dutch, MD;  Location: [facility] CATH LAB;  Service: Cardiovascular;  Laterality: Left;  . Abdominal angiogram  01/08/2014    Procedure: ABDOMINAL ANGIOGRAM;  Surgeon: Elam Dutch, MD;  Location: [facility] CATH LAB;  Service: Cardiovascular;;  . Lower extremity angiogram Right 08/03/2014    Procedure: LOWER EXTREMITY ANGIOGRAM;  Surgeon: Serafina Mitchell, MD;  Location: [facility] CATH LAB;  Service: Cardiovascular;  Laterality: Right;  . Abdominal aortagram N/A 10/25/2014    Procedure: ABDOMINAL Maxcine Ham;  Surgeon: Angelia Mould, MD;  Location: [facility] CATH LAB;  Service: Cardiovascular;  Laterality: N/A;  . Ep implantable device N/A 05/10/2015    Procedure: Pacemaker Implant;  Surgeon: Thompson Grayer, MD;  Location: [place] CV LAB;  Service: Cardiovascular;  Laterality: N/A;  . Ep implantable device N/A 06/13/2015    Procedure: Lead Revision/Repair;  Surgeon: Deboraha Sprang, MD;  Location: [place] CV LAB;  Service: Cardiovascular;  Laterality: N/A;  . Pacemaker lead removal  06/13/2015    Procedure: Pacemaker Lead Removal;  Surgeon: Deboraha Sprang, MD;  Location: [place] CV LAB;  Service: Cardiovascular;;  . Insert / replace / remove pacemaker     Family History  Family History  Problem Relation Age of Onset  . Diabetes Father   . Hypertension Father   . Hypertension Daughter   . Hyperlipidemia Daughter   . Hyperlipidemia Son   . Hypertension Sister    Social History  reports that she has never smoked. She has never used smokeless tobacco. She reports that she does not drink alcohol or use illicit drugs. Allergies No Known Allergies Home medications Prior to Admission medications   Medication Sig Start Date End Date Taking? Authorizing Provider  albuterol (PROVENTIL HFA;VENTOLIN HFA) 108 (90 BASE) MCG/ACT inhaler Inhale 2 puffs into the lungs every 6 (six) hours as needed for wheezing or shortness of breath.   Yes Historical Provider, MD  aspirin 81 MG tablet Take  81 mg by mouth every morning.    Yes Historical Provider, MD  atorvastatin (LIPITOR) 40 MG tablet Take 40 mg by mouth every morning.  01/25/14  Yes Historical Provider, MD  B Complex-C-Folic Acid (RENA-VITE RX) 1 MG TABS Take 1 tablet by mouth every morning.  02/05/14  Yes Historical Provider, MD  carvedilol (COREG) 12.5 MG tablet Take 1 tablet (12.5 mg total) by mouth 2 (two) times daily. 02/04/14  Yes Belva Crome, MD  docusate sodium (COLACE) 100 MG capsule Take 100 mg by mouth 2 (two) times daily.    Yes Historical Provider, MD  furosemide (LASIX) 40 MG  tablet Take 1 tablet (40 mg total) by mouth 2 (two) times daily. Patient taking differently: Take 80 mg by mouth 2 (two) times daily.  11/09/15  Yes Belva Crome, MD  hydrALAZINE (APRESOLINE) 10 MG tablet Take 1 tablet (10 mg total) by mouth 3 (three) times daily. 09/21/15  Yes Deboraha Sprang, MD  isosorbide mononitrate (IMDUR) 30 MG 24 hr tablet Take 30 mg by mouth daily.   Yes Historical Provider, MD  JANUVIA 25 MG tablet Take 25 mg by mouth daily. 06/29/15  Yes Historical Provider, MD  loratadine (CLARITIN) 10 MG tablet Take 10 mg by mouth daily as needed for allergies.   Yes Historical Provider, MD  Melatonin 3 MG TABS Take 3 mg by mouth at bedtime as needed (for sleep).    Yes Historical Provider, MD  nitroGLYCERIN (NITROSTAT) 0.4 MG SL tablet Place 1 tablet (0.4 mg total) under the tongue every 5 (five) minutes x 3 doses as needed for chest pain. 06/17/15  Yes Belva Crome, MD  paricalcitol (ZEMPLAR) 1 MCG capsule Take 1 mcg by mouth daily.  04/07/14  Yes Historical Provider, MD  Vitamin D, Ergocalciferol, (DRISDOL) 50000 UNITS CAPS capsule Take 1 capsule (50,000 Units total) by mouth every 7 (seven) days. 06/15/15  Yes Luan Moore, MD   Liver Function Tests  Recent Labs Lab 06/29/16 1711  AST 26  ALT 18  ALKPHOS 56  BILITOT 1.0  PROT 7.2  ALBUMIN 3.8   No results for input(s): LIPASE, AMYLASE in the last 168 hours. CBC  Recent  Labs Lab 06/29/16 1711 06/29/16 1712 06/30/16 0731  WBC 5.7  --  6.2  NEUTROABS 4.1  --  4.5  HGB 10.0* 11.2* 10.0*  HCT 32.0* 33.0* 33.5*  MCV 98.2  --  98.8  PLT 217  --  035   Basic Metabolic Panel  Recent Labs Lab 06/29/16 1711 06/29/16 1712 06/30/16 0731  NA 137 139 142  K 4.2 4.2 4.0  CL 99* 99* 100*  CO2 26  --  29  GLUCOSE 170* 165* 80  BUN 65* 61* 64*  CREATININE 5.19* 5.20* 5.29*  CALCIUM 10.4*  --  10.2   Iron/TIBC/Ferritin/ %Sat    Component Value Date/Time   IRON 20* 05/12/2015 1438   TIBC 203* 05/12/2015 1438   FERRITIN 364* 05/12/2015 1438   IRONPCTSAT 10* 05/12/2015 1438   IRONPCTSAT 62* 09/03/2008 1100    Filed Vitals:   06/30/16 0828 06/30/16 1200 06/30/16 1515 06/30/16 1600  BP:  148/71 133/56 127/60  Pulse:  59  60  Temp:  97.3 F (36.3 C)  97.5 F (36.4 C)  TempSrc:  Oral  Oral  Resp:  16  16  Height:      Weight:      SpO2: 97% 100%  100%   Exam Gen elderly AAF, R facial droop and dysarthria No rash, cyanosis or gangrene Sclera anicteric, throat clear  No jvd Chest faint rales bilat bases RRR distant HS, no M or rub heard Abd soft ntnd no mass or ascites +bs obese GU deferred MS no joint effusions or deformity. L leg scar from old fem-pop well healed Ext trace bilat pretib edema / no wounds or ulcers Neuro is alert, responsive, R hemiparesis and facial droop   Na 142 K 4.0  BUN 64  Creat 5.29  CO2 29  Alb 3.8 WBC 6.2  Hb 10  plt 212 CXR mild CHF  Assessment: 1  CKD stage 5- suspect this  is patient's new baseline, acute/ CRF due to cardiorenal is less likely.  Surprisingly she doesn't exhibit any obvious uremic signs or symptoms. Poor dialysis candidate, recommend conservative/ medical management.  Will increase IV lasix  60 mg q 12 (takes 40-80 po lasix bid at home).   2  Pulm edema - mild, resp status improving 3  Acute CVA / R hemiparesis 4  HTN - bp's low to normal, getting hydralazine and lasix here (home coreg no  hold) 5  Hx systolic CHF   Plan - as above, will follow  Kelly Splinter MD [place] pager [phone]    cell [phone] 06/30/2016, 7:50 PM

## 2016-06-30 NOTE — Evaluation (Signed)
Speech Language Pathology Evaluation Patient Details Name: Dorothy Bartlett MRN: [id number] DOB: [birth year] Today's Date: 06/30/2016 Time: [id number] SLP Time Calculation (min) (ACUTE ONLY): 30 min  Problem List:  Patient Active Problem List   Diagnosis Date Noted  . Acute CVA (cerebrovascular accident) (HCC) 06/29/2016  . AKI (acute kidney injury) (HCC) 06/29/2016  . Acute on chronic combined systolic and diastolic heart failure (HCC)   . Essential hypertension   . Vitamin D deficiency 06/14/2015  . Secondary hyperparathyroidism (HCC) 06/14/2015  . Dyspnea   . Pacemaker lead malfunction 06/11/2015  . Cardiomyopathy-EF 40-45% by echo May 2016 06/11/2015  . Palliative care encounter   . Onychomycosis 06/03/2015  . Pain in lower limb 06/03/2015  . Pulmonary HTN (HCC) 05/15/2015  . Pacemaker-St Jude May 2016   . Elevated troponin 05/09/2015  . Pre-syncope 05/09/2015  . 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 Complete heart block (HCC) 05/08/2015  . Obesity (BMI 30-39.9)   . Old anterolateral myocardial infarction   . Type 2 diabetes mellitus with renal manifestations (HCC)   . Chronic combined systolic and diastolic heart failure (HCC) 04/13/2014  . PVD (peripheral vascular disease) (HCC) 02/16/2014  . CKD stage 5, GFR less than 15 ml/min 01/15/2014  . Intrinsic asthma 01/28/2008  . Hypertensive heart disease   . Hyperlipidemia   . CAD S/P PTCA 1995    Past Medical History:  Past Medical History  Diagnosis Date  . Hyperlipidemia   . Carotid stenosis     Carotid US (12/2013): < 60% RCA, < 40% LICA.  [name]. Hypertension   . Coronary artery disease     a. s/p MI 1995 tx w/ PTCA to Dx, Ramus, ap LAD;  b. LHC (11/2004):  Inferior HK, EF 60%, proximal LAD 50%, ostial D1 80%, mid D1 95%, mid LAD 50-60%, distal LAD at the apex 90%, OM1 occluded (fills late by left to left collaterals), RCA 75%. Medical therapy recommended.;  c. Lexiscan Myoview  (12/2013):  Prior ant-lat scar with very small area of peri-infarct ischemia in ant wall, EF 40 (high risk)   . Obesity (BMI 30-39.9)   . Intrinsic asthma 01/28/2008     Mild intermittant asthma, PFT's 01/28/08     . Old anterolateral myocardial infarction   . PVD (peripheral vascular disease) (HCC) 02/16/2014    2015, right femoral peroneal bypass grafting and left femoral-tibial bypass grafting by Dr. Arbie CookeyEarly for rest pain as well as nonhealing ulcer on toe accompanied with toe amputation  Arteriogram October 2015 with patent graft on the right, 70% stenosis of graft on the left proximally.   . Chronic combined systolic and diastolic heart failure (HCC) 04/13/2014  . Complete heart block (HCC)     a. 05/2015 [facility]p [facility] [employer] DR model [id number] dual chamber PPM (serial number [id number]) (Dr. Johney FrameAllred).  [name]. Hx of echocardiogram     a. Echo 5/16: dist septal, dist ant, dist inf and apical AK, EF 40-45%, mod MR, mild LAE, PASP 72  . Presence of permanent cardiac pacemaker   . Chronic kidney disease (CKD), stage V (HCC)     notes 07/03/2015; Dr. Hyman HopesWebb  . 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 Type 2 diabetes mellitus with renal manifestations [facility](HCC)    Past Surgical History:  Past Surgical History  Procedure Laterality Date  . Repair of nerve in left arm  Left 2000  . Cardiac catheterization  1995  w/ PTCA Diag 2nd MI  . Femoral-tibial bypass graft Left 02/24/2014    Procedure: BYPASS GRAFT FEMORAL-TIBIAL ARTERY;  Surgeon: Larina Earthly, MD;  Location: [facility] OR;  Service: Vascular;  Laterality: Left;  . Femoral-popliteal bypass graft Right 08/04/2014    Procedure: RIGHT FEMORAL-PERONEAL ARTERY BYPASS GRAFT;  Surgeon: Larina Earthly, MD;  Location: [facility] OR;  Service: Vascular;  Laterality: Right;  . Amputation Right 08/04/2014    Procedure: AMPUTATION DIGIT-RIGHT 3RD TOE;  Surgeon: Larina Earthly, MD;  Location: [facility] OR;  Service: Vascular;  Laterality: Right;  . Lower  extremity angiogram Left 01/08/2014    Procedure: LOWER EXTREMITY ANGIOGRAM;  Surgeon: Sherren Kerns, MD;  Location: [facility] CATH LAB;  Service: Cardiovascular;  Laterality: Left;  . Abdominal angiogram  01/08/2014    Procedure: ABDOMINAL ANGIOGRAM;  Surgeon: Sherren Kerns, MD;  Location: [facility] CATH LAB;  Service: Cardiovascular;;  . Lower extremity angiogram Right 08/03/2014    Procedure: LOWER EXTREMITY ANGIOGRAM;  Surgeon: Nada Libman, MD;  Location: [facility] CATH LAB;  Service: Cardiovascular;  Laterality: Right;  . Abdominal aortagram N/A 10/25/2014    Procedure: ABDOMINAL Ronny Flurry;  Surgeon: Chuck Hint, MD;  Location: [facility] CATH LAB;  Service: Cardiovascular;  Laterality: N/A;  . Ep implantable device N/A 05/10/2015    Procedure: Pacemaker Implant;  Surgeon: Hillis Range, MD;  Location: [facility] INVASIVE CV LAB;  Service: Cardiovascular;  Laterality: N/A;  . Ep implantable device N/A 06/13/2015    Procedure: Lead Revision/Repair;  Surgeon: Duke Salvia, MD;  Location: [facility] INVASIVE CV LAB;  Service: Cardiovascular;  Laterality: N/A;  . Pacemaker lead removal  06/13/2015    Procedure: Pacemaker Lead Removal;  Surgeon: Duke Salvia, MD;  Location: [facility] INVASIVE CV LAB;  Service: Cardiovascular;;  . Insert / replace / remove pacemaker     HPI:  Dorothy Bartlett is a 82 y.o. female with medical history significant of hyperlipidemia, coronary artery stenosis, hypertension, CAD, chronic combined systolic and diastolic heart failure, PVD,obesity, intrinsic asthma, stage V chronic kidney disease was brought to the emergency department after she was found by her daughter on the front porch with right-sided facial droop, drooling and slurred speech and right-sided upper extremity paresis.  The patient was placed on BiPAP ventilation due to dyspnea and hypoxia. CT was negative CXR showed mild CHF. MD asked SLP verbally to evaluate pt.  No notes from SLP in chart.    Assessment / Plan / Recommendation Clinical  Impression  Pts primary deficit is a moderate dysarthria due to CN VII weakness, complicated by missing dentition and decreased breath support for volume. Pt also demonstrates mild signs of expressive aphasia with mild word finding impairment in conversation, though pt 100% accurate with naming when given extra time. She also appears fatigued by speech which could account for decreased verbal output and occasional telegraphic language. There is likely a right visual field cut as pt requires cues to focus on objects on the right and find objects in functional tasks and words in reading tasks (also complicated by cataracts). Receptive language and cognition are relatively good, though some mild memory deficits will impact recall of therapeutic interventions; this may be baseline. Recommend pt recieve SLP in acute setting for functional communication skills and suggest f/u with CIR.     SLP Assessment  Patient needs continued Speech Lanaguage Pathology Services    Follow Up Recommendations  Inpatient Rehab    Frequency and Duration min 2x/week  2 weeks  SLP Evaluation Prior Functioning  Cognitive/Linguistic Baseline: Information not available  Lives With: Alone (per pt)   Cognition  Overall Cognitive Status: No family/caregiver present to determine baseline cognitive functioning Arousal/Alertness: Awake/alert Orientation Level: Oriented to person;Oriented to place;Oriented to situation;Disoriented to time Attention: Sustained Sustained Attention: Appears intact Memory: Impaired Memory Impairment: Storage deficit;Retrieval deficit;Decreased short term memory;Decreased recall of new information Decreased Short Term Memory: Verbal basic;Functional basic Awareness: Appears intact Problem Solving: Appears intact Safety/Judgment: Appears intact    Comprehension  Auditory Comprehension Overall Auditory Comprehension: Appears within functional limits for tasks assessed Visual  Recognition/Discrimination Discrimination: Within Function Limits Reading Comprehension Reading Status: Impaired Word level: Within functional limits Sentence Level: Impaired Paragraph Level: Not tested Interfering Components: Visual scanning;Visual acuity Effective Techniques: Visual cueing    Expression Expression Primary Mode of Expression: Verbal Verbal Expression Overall Verbal Expression: Impaired Initiation: No impairment Automatic Speech: Name;Social Response Level of Generative/Spontaneous Verbalization: Phrase Repetition: No impairment Naming: Impairment Responsive: Not tested Confrontation: Within functional limits Convergent: Not tested Divergent: 75-100% accurate Other Naming Comments: hesitations, able to find words with extra time Verbal Errors: Aware of errors Pragmatics: No impairment Interfering Components: Speech intelligibility Effective Techniques:  (extra time) Written Expression Dominant Hand: Right Written Expression: Unable to assess (comment)   Oral / Motor  Oral Motor/Sensory Function Overall Oral Motor/Sensory Function: Mild impairment Facial ROM: Reduced right;Suspected CN VII (facial) dysfunction Facial Symmetry: Abnormal symmetry right;Suspected CN VII (facial) dysfunction Facial Strength: Reduced right;Suspected CN VII (facial) dysfunction Facial Sensation: Reduced right;Suspected CN V (Trigeminal) dysfunction Lingual ROM: Within Functional Limits Lingual Symmetry: Within Functional Limits Lingual Strength: Within Functional Limits Lingual Sensation: Within Functional Limits Velum: Within Functional Limits Mandible: Within Functional Limits Motor Speech Overall Motor Speech: Impaired Respiration: Impaired Level of Impairment: Phrase Phonation: Breathy;Low vocal intensity Resonance: Within functional limits Articulation: Impaired Level of Impairment: Word Intelligibility: Intelligibility reduced Word: 75-100% accurate Phrase:  75-100% accurate Sentence: 75-100% accurate Conversation: 75-100% accurate Motor Planning: Witnin functional limits Motor Speech Errors: Aware;Consistent Interfering Components: Inadequate dentition Effective Techniques: Increased vocal intensity;Over-articulate   GO                   Harlon DittyBonnie Bladimir Auman, MA CCC-SLP [phone]; Claudine MoutonDeBlois, Marshella Tello Caroline 06/30/2016, 10:23 AM

## 2016-06-30 NOTE — Evaluation (Signed)
Physical Therapy Evaluation Patient Details Name: Claretta FraiseCorine H Delaguila MRN: [id number] DOB: March 18, 1926 Today's Date: 06/30/2016   History of Present Illness  Donnalyn Rushie ChestnutH Brundage is a 82 y.o. female with medical history significant of hyperlipidemia, coronary artery stenosis, hypertension, CAD, chronic combined systolic and diastolic heart failure, PVD,obesity, intrinsic asthma, stage V chronic kidney disease was brought to the emergency department after she was found by her daughter on the front porch with right-sided facial droop, drooling and slurred speech and right-sided upper extremity paresis. The patient was placed on BiPAP ventilation due to dyspnea and hypoxia. CT was negative CXR showed mild CHF.   Clinical Impression  Pt admitted with above diagnosis. Pt currently with functional limitations due to the deficits listed below (see PT Problem List). Demonstrates ability to perform bed mobility and transfer with min-mod assist. Diminished strength and coordination of Rt UE & LEs. Strong family support. Would benefit from CIR to regain functional abilities and increase safety. Pt will benefit from skilled PT acutely to progress her independence and safety.    Follow Up Recommendations CIR    Equipment Recommendations   (TBD next venue of care)    Recommendations for Other Services Rehab consult     Precautions / Restrictions Precautions Precautions: Fall Restrictions Weight Bearing Restrictions: No      Mobility  Bed Mobility Overal bed mobility: Needs Assistance Bed Mobility: Supine to Sit     Supine to sit: Min assist     General bed mobility comments: Able to achieve sitting EOB with cues and heavy use of rail, but required min assist to scoot to EOB this date.  Transfers Overall transfer level: Needs assistance Equipment used: 1 person hand held assist (chair) Transfers: Sit to/from UGI CorporationStand;Stand Pivot Transfers Sit to Stand: Min assist Stand pivot transfers: Mod assist        General transfer comment: Min assist for boost to stand using LUE on back of chair for support and slight Rt knee block initially. Practiced x2. Able to shift weight and with Mod assist for balance and sequencing able to pivot with small shuffled steps to chair.   Ambulation/Gait                Stairs            Wheelchair Mobility    Modified Rankin (Stroke Patients Only) Modified Rankin (Stroke Patients Only) Pre-Morbid Rankin Score: Slight disability Modified Rankin: Moderately severe disability     Balance Overall balance assessment: Needs assistance Sitting-balance support: No upper extremity supported;Feet supported Sitting balance-Leahy Scale: Fair Sitting balance - Comments: Able to weight shift and maintain midline in sitting   Standing balance support: Single extremity supported Standing balance-Leahy Scale: Poor                               Pertinent Vitals/Pain Pain Assessment: No/denies pain    Home Living Family/patient expects to be discharged to:: Private residence Living Arrangements: Alone (Dtr visits daily, nephew stays at night sometimes) Available Help at Discharge: Family (potentially 24/7 assist) Type of Home: House Home Access: Stairs to enter Entrance Stairs-Rails: None Entrance Stairs-Number of Steps: 2 Home Layout: One level Home Equipment: Walker - 2 wheels;Wheelchair - Fluor Corporationmanual;Bedside commode;Shower seat;Tub bench      Prior Function Level of Independence: Needs assistance   Gait / Transfers Assistance Needed: Independent, furniture walks, walker for short distances outdoors, w/c long distances  ADL's / Homemaking Assistance Needed:  Able to independently bath/dress herself, family assisted with cooking only due to vision problems        Hand Dominance   Dominant Hand: Right    Extremity/Trunk Assessment   Upper Extremity Assessment: Defer to OT evaluation           Lower Extremity Assessment: RLE  deficits/detail RLE Deficits / Details: Strength grossly 4/5 proximally, difficulty following testing commands. Able to bear weight functionally although with ataxic movements       Communication   Communication: Expressive difficulties  Cognition Arousal/Alertness: Awake/alert Behavior During Therapy: Flat affect Overall Cognitive Status: Impaired/Different from baseline Area of Impairment: Following commands;Problem solving       Following Commands: Follows one step commands with increased time;Follows multi-step commands inconsistently     Problem Solving: Slow processing;Decreased initiation;Requires verbal cues;Difficulty sequencing;Requires tactile cues      General Comments General comments (skin integrity, edema, etc.): Family present and very supportive. Educated on positioning of RUE to prevent subluxation and secondary complicaitons.    Exercises        Assessment/Plan    PT Assessment Patient needs continued PT services  PT Diagnosis Difficulty walking;Generalized weakness;Hemiplegia dominant side   PT Problem List Decreased strength;Decreased range of motion;Decreased activity tolerance;Decreased balance;Decreased mobility;Decreased coordination;Decreased cognition;Decreased knowledge of use of DME;Impaired tone  PT Treatment Interventions DME instruction;Gait training;Functional mobility training;Therapeutic activities;Therapeutic exercise;Balance training;Neuromuscular re-education;Patient/family education;Cognitive remediation   PT Goals (Current goals can be found in the Care Plan section) Acute Rehab PT Goals Patient Stated Goal: Eat. PT Goal Formulation: With patient/family Time For Goal Achievement: 07/14/16 Potential to Achieve Goals: Good    Frequency Min 4X/week   Barriers to discharge Decreased caregiver support Pending family arrangements    Co-evaluation               End of Session Equipment Utilized During Treatment: Gait  belt;Oxygen Activity Tolerance: Patient tolerated treatment well Patient left: in chair;with call bell/phone within reach;with family/visitor present Nurse Communication: Mobility status         Time: [id number] PT Time Calculation (min) (ACUTE ONLY): 26 min   Charges:   PT Evaluation $PT Eval High Complexity: 1 Procedure PT Treatments $Therapeutic Activity: 8-22 mins   PT G CodesBerton Mount:        Malisha Mabey S 06/30/2016, 2:12 PM Sunday SpillersLogan Secor [place], [place]PT [phone]

## 2016-06-30 NOTE — Progress Notes (Addendum)
STROKE TEAM PROGRESS NOTE   HISTORY OF PRESENT ILLNESS (per record)  Dorothy Bartlett is an 82 y.o. female who presented with aphasia and right sided weakness. Last seen normal by daughter at 1:55 pm. She has a history of HTN, DM, CKD  Date last known well: Date: 06/30/2015 Time last known well: Time: 02:00 tPA Given: NO    SUBJECTIVE (INTERVAL HISTORY) Family member at the bedside. The patient continues to have speech problems. She is quite lethargic.   OBJECTIVE Temp:  [97.3 F (36.3 C)-98.4 F (36.9 C)] 97.3 F (36.3 C) (07/01 1200) Pulse Rate:  [58-92] 59 (07/01 1200) Cardiac Rhythm:  [-] A-V Sequential paced (07/01 0701) Resp:  [15-33] 16 (07/01 1200) BP: (136-178)/(56-91) 148/71 mmHg (07/01 1200) SpO2:  [97 %-100 %] 100 % (07/01 1200) FiO2 (%):  [40 %] 40 % (07/01 0326) Weight:  [77.7 kg (171 lb 4.8 oz)-78.3 kg (172 lb 9.9 oz)] 78.1 kg (172 lb 2.9 oz) (07/01 0400)  CBC:   Recent Labs Lab 06/29/16 1711 06/29/16 1712 06/30/16 0731  WBC 5.7  --  6.2  NEUTROABS 4.1  --  4.5  HGB 10.0* 11.2* 10.0*  HCT 32.0* 33.0* 33.5*  MCV 98.2  --  98.8  PLT 217  --  212    Basic Metabolic Panel:   Recent Labs Lab 06/29/16 1711 06/29/16 1712 06/30/16 0731  NA 137 139 142  K 4.2 4.2 4.0  CL 99* 99* 100*  CO2 26  --  29  GLUCOSE 170* 165* 80  BUN 65* 61* 64*  CREATININE 5.19* 5.20* 5.29*  CALCIUM 10.4*  --  10.2  MG  --   --  2.6*    Lipid Panel:     Component Value Date/Time   CHOL 156 06/30/2016 0731   TRIG 120 06/30/2016 0731   HDL 49 06/30/2016 0731   CHOLHDL 3.2 06/30/2016 0731   VLDL 24 06/30/2016 0731   LDLCALC 83 06/30/2016 0731   HgbA1c:  Lab Results  Component Value Date   HGBA1C 5.8* 05/08/2015   Urine Drug Screen: No results found for: LABOPIA, COCAINSCRNUR, LABBENZ, AMPHETMU, THCU, LABBARB    IMAGING  Ct Angio Head and Neck W Or Wo Contrast 06/29/2016    CT  Small vessel disease changes without CT evidence of large acute infarct. No  intracranial mass or abnormal enhancement. Global mild atrophy without hydrocephalus.   CTA NECK  Aortic atherosclerosis. Plaque proximal right internal carotid artery with greater than 75% diameter stenosis. Ectatic left carotid artery with bifurcation posterior to the pharynx. No hemodynamically significant stenosis of the left internal carotid artery. Mild narrowing origin of vertebral arteries bilaterally. Left vertebral artery dominant in size. Multilevel cervical spondylotic changes with various degrees of spinal stenosis and foraminal narrowing. Limbus vertebra C7/protrusion with marked canal narrowing. Bilateral pleural effusions greater on right. Lung parenchymal changes partially reflective of pulmonary vascular congestion however, ground-glass opacities throughout the right lung apex including 1.4 cm rounded consolidation. This appearance is atypical for congestive heart. Recommend follow-up chest CT in 3 months after treatment of any congestive heart failure or infectious infiltrate.   CTA HEAD  Prominent calcification cavernous segment internal carotid artery bilaterally with moderate to marked narrowing. Mild narrowing left middle cerebral artery M1 segment. Moderate narrowing right middle cerebral artery M1 segment. Moderate to marked narrowing A1 segment right anterior cerebral artery. Left vertebral artery is dominant. Moderate to marked narrowing vertebral arteries at the level of the foramen magnum. Mild narrowing and irregularity  basilar artery. Moderate narrowing proximal mid and distal aspect posterior cerebral artery bilaterally.    Dg Chest Port 1 View 06/29/2016   Mild CHF. Aortic atherosclerosis.    Ct Head Code Stroke W/o Cm 06/29/2016   1. No intracranial hemorrhage or definite acute infarct.  2. Chronic microvascular disease and mild for age atrophy.     PHYSICAL EXAM  Pleasant elderly african american lady not in distress. . Afebrile. Head is nontraumatic. Neck is  supple without bruit.    Cardiac exam no murmur or gallop. Lungs are clear to auscultation. Distal pulses are well felt. Neurological Exam : Awake alert oriented x 2 expressive aphasia with dysarthric speech and language. Mild right lower face asymmetry. Tongue midline. Right upper and lower extremity drift 2-3/5 strength on right.. Mild diminished fine finger movements on right Orbits left over right upper extremity. .Diminished right hemibody sensation . Gait deferred.           ASSESSMENT/PLAN Ms. Dorothy Bartlett is a 82 y.o. female with history of hyperlipidemia, carotid stenosis, hypertension, coronary artery disease, obesity, previous MI, congestive heart failure, permanent pacemaker, diabetes mellitus, and chronic kidney disease presenting with aphasia and right hemiparesis. She did not receive IV t-PA due to late presentation.  Stroke:  Dominant infarct of uncertain etiology.  Resultant- speech difficulties and right hemiparesis.  MRI  permanent pacemaker  MRA  permanent pacemaker  Carotid Doppler - refer to CTA of the neck.  2D Echo - pending  LDL - 83  HgbA1c pending  VTE prophylaxis - Lovenox DIET - DYS 1 Room service appropriate?: Yes; Fluid consistency:: Thin  aspirin 81 mg daily prior to admission, now on clopidogrel 75 mg daily  Patient counseled to be compliant with her antithrombotic medications  Ongoing aggressive stroke risk factor management  Therapy recommendations: Pending  Disposition: Pending  Hypertension  Stable  Permissive hypertension (OK if < 220/120) but gradually normalize in 5-7 days  Long-term BP goal normotensive  Hyperlipidemia  Home meds:  Lipitor 40 mg daily resumed in hospital  LDL 83, goal < 70  Increase Lipitor to 80 mg daily  Continue statin at discharge  Diabetes  HgbA1c pending, goal < 7.0  Uncontrolled  Other Stroke Risk Factors  Advanced age  Obesity, Body mass index is 29.54 kg/(m^2)., recommend weight  loss, diet and exercise as appropriate   Coronary artery disease   Other Active Problems  Chronic kidney disease - creatinine 5.29 (not on dialysis)  Right internal carotid artery stenosis greater than 75%  Right lung abnormalities - follow-up chest CT recommended in 3 months  Anemia  Permanent pacemaker - no MRI - repeat head CT in a.m.   Hospital day # 1  Delton See PA-C [facility] Pager [phone] 06/30/2016, 1:57 PM  I have personally examined this patient, reviewed notes, independently viewed imaging studies, participated in medical decision making and plan of care. I have made any additions or clarifications directly to the above note. Agree with note above. She presented with speech difficulties and right hemiparesis due to suspected left brain infarct and remains at risk for neurological worsening, recurrent stroke and TIA and needs ongoing stroke eval and aggressive risk factor modification. Repeat CT head in am as cannot obtain MRI due to pacer.. Increase Lipitor dose to 80 mg daily. D/w medical hospitalist.I spent a total of   35 Minutes in face to face in clinical consultation, greater than 50% of which was counseling/coordinating care about stroke risk, prevention  and treatment  Delia HeadyPramod Kalista Laguardia, MD Medical Director [facility] Pager: [phone] 06/30/2016 6:18 PM    To contact Stroke Continuity provider, please refer to [email or web address]. After hours, contact General Neurology

## 2016-06-30 NOTE — Evaluation (Signed)
Clinical/Bedside Swallow Evaluation Patient Details  Name: Dorothy FraiseCorine H Boomershine MRN: [id number] Date of Birth: 1926-11-15  Today's Date: 06/30/2016 Time: SLP Start Time (ACUTE ONLY): 0850 SLP Stop Time (ACUTE ONLY): 0920 SLP Time Calculation (min) (ACUTE ONLY): 30 min  Past Medical History:  Past Medical History  Diagnosis Date  . Hyperlipidemia   . Carotid stenosis     Carotid US (12/2013): < 60% RCA, < 40% LICA.  [name]. Hypertension   . Coronary artery disease     a. s/p MI 1995 tx w/ PTCA to Dx, Ramus, ap LAD;  b. LHC (11/2004):  Inferior HK, EF 60%, proximal LAD 50%, ostial D1 80%, mid D1 95%, mid LAD 50-60%, distal LAD at the apex 90%, OM1 occluded (fills late by left to left collaterals), RCA 75%. Medical therapy recommended.;  c. Lexiscan Myoview (12/2013):  Prior ant-lat scar with very small area of peri-infarct ischemia in ant wall, EF 40 (high risk)   . Obesity (BMI 30-39.9)   . Intrinsic asthma 01/28/2008     Mild intermittant asthma, PFT's 01/28/08     . Old anterolateral myocardial infarction   . PVD (peripheral vascular disease) (HCC) 02/16/2014    2015, right femoral peroneal bypass grafting and left femoral-tibial bypass grafting by Dr. Arbie CookeyEarly for rest pain as well as nonhealing ulcer on toe accompanied with toe amputation  Arteriogram October 2015 with patent graft on the right, 70% stenosis of graft on the left proximally.   . Chronic combined systolic and diastolic heart failure (HCC) 04/13/2014  . Complete heart block (HCC)     a. 05/2015 s/p [facility] [employer] [facility] model [id number] dual chamber PPM (serial number [id number]) (Dr. Johney FrameAllred).  [name]. Hx of echocardiogram     a. Echo 5/16: dist septal, dist ant, dist inf and apical AK, EF 40-45%, mod MR, mild LAE, PASP 72  . Presence of permanent cardiac pacemaker   . Chronic kidney disease (CKD), stage V (HCC)     notes 07/03/2015; Dr. Hyman HopesWebb  . 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 Type 2 diabetes  mellitus with renal manifestations [facility](HCC)    Past Surgical History:  Past Surgical History  Procedure Laterality Date  . Repair of nerve in left arm  Left 2000  . Cardiac catheterization  1995    w/ PTCA Diag 2nd MI  . Femoral-tibial bypass graft Left 02/24/2014    Procedure: BYPASS GRAFT FEMORAL-TIBIAL ARTERY;  Surgeon: Larina Earthlyodd F Early, MD;  [facility];  Service: Vascular;  Laterality: Left;  . Femoral-popliteal bypass graft Right 08/04/2014    Procedure: RIGHT FEMORAL-PERONEAL ARTERY BYPASS GRAFT;  Surgeon: Larina Earthlyodd F Early, MD;  [facility];  Service: Vascular;  Laterality: Right;  . Amputation Right 08/04/2014    Procedure: AMPUTATION DIGIT-RIGHT 3RD TOE;  Surgeon: Larina Earthlyodd F Early, MD;  [facility];  Service: Vascular;  Laterality: Right;  . Lower extremity angiogram Left 01/08/2014    Procedure: LOWER EXTREMITY ANGIOGRAM;  Surgeon: Sherren Kernsharles E Fields, MD;  [facility];  Service: Cardiovascular;  Laterality: Left;  . Abdominal angiogram  01/08/2014    Procedure: ABDOMINAL ANGIOGRAM;  Surgeon: Sherren Kernsharles E Fields, MD;  [facility];  Service: Cardiovascular;;  . Lower extremity angiogram Right 08/03/2014    Procedure: LOWER EXTREMITY ANGIOGRAM;  Surgeon: Nada LibmanVance W Brabham, MD;  [facility];  Service: Cardiovascular;  Laterality: Right;  . Abdominal aortagram N/A 10/25/2014    Procedure: ABDOMINAL AORTAGRAM;  Surgeon:  Chuck Hinthristopher S Dickson, MD;  [facility];  Service: Cardiovascular;  Laterality: N/A;  . Ep implantable device N/A 05/10/2015    Procedure: Pacemaker Implant;  Surgeon: Hillis RangeJames Allred, MD;  [facility];  Service: Cardiovascular;  Laterality: N/A;  . Ep implantable device N/A 06/13/2015    Procedure: Lead Revision/Repair;  Surgeon: Duke SalviaSteven C Klein, MD;  [facility];  Service: Cardiovascular;  Laterality: N/A;  . Pacemaker lead removal  06/13/2015    Procedure: Pacemaker Lead Removal;  Surgeon: Duke SalviaSteven C Klein, MD;  [facility];  Service: Cardiovascular;;  . Insert / replace / remove pacemaker     HPI:  Machell H Franki MonteDeese is a 82 y.o. female with medical history significant of hyperlipidemia, coronary artery stenosis, hypertension, CAD, chronic combined systolic and diastolic heart failure, PVD,obesity, intrinsic asthma, stage V chronic kidney disease was brought to the emergency department after she was found by her daughter on the front porch with right-sided facial droop, drooling and slurred speech and right-sided upper extremity paresis.  The patient was placed on BiPAP ventilation due to dyspnea and hypoxia. CT was negative CXR showed mild CHF. MD asked SLP verbally to evaluate pt.  No notes from SLP in chart.    Assessment / Plan / Recommendation Clinical Impression  Pt demonstrates a primary oral dysphagia due to CN VII weakness resulting in reduced labial closure on the right and anterior spillage of small amounts of saliva during speech and thin liquids with larger sips. Pt was able to improve labial closure with verbal and visual cues for tighter labial seal, though she could not carry this over after time. No signs of aspiration were observed over 6 oz of intake through session, though pt is at risk due to baseline respiratory impairment and new CVA with oral dysphagia. Will continue to monitor closely for tolerance and trials of upgraded solids with dentures are available. Recommend a dys 1 (puree) diet with thin liquids with pills whole in puree.     Aspiration Risk  Moderate aspiration risk    Diet Recommendation Dysphagia 1 (Puree);Thin liquid   Liquid Administration via: Cup;Straw Medication Administration: Whole meds with puree Supervision: Staff to assist with self feeding Compensations: Minimize environmental distractions;Slow rate;Small sips/bites;Monitor for anterior loss Postural Changes: Seated upright at 90 degrees;Remain upright for at least 30 minutes after po intake    Other   Recommendations Oral Care Recommendations: Oral care BID   Follow up Recommendations  Inpatient Rehab    Frequency and Duration min 2x/week  2 weeks       Prognosis Prognosis for Safe Diet Advancement: Good      Swallow Study   General HPI: Shanty Rushie ChestnutH Avilla is a 82 y.o. female with medical history significant of hyperlipidemia, coronary artery stenosis, hypertension, CAD, chronic combined systolic and diastolic heart failure, PVD,obesity, intrinsic asthma, stage V chronic kidney disease was brought to the emergency department after she was found by her daughter on the front porch with right-sided facial droop, drooling and slurred speech and right-sided upper extremity paresis.  The patient was placed on BiPAP ventilation due to dyspnea and hypoxia. CT was negative CXR showed mild CHF. MD asked SLP verbally to evaluate pt.  No notes from SLP in chart.  Type of Study: Bedside Swallow Evaluation Previous Swallow Assessment: none Diet Prior to this Study: NPO Temperature Spikes Noted: No Respiratory Status: Nasal cannula History of Recent Intubation: No Behavior/Cognition: Alert;Cooperative;Pleasant mood Oral Cavity  Assessment: Within Functional Limits Oral Care Completed by SLP: No Oral Cavity - Dentition: Edentulous;Dentures, not available Vision: Impaired for self-feeding Self-Feeding Abilities: Needs assist Patient Positioning: Upright in bed Baseline Vocal Quality: Breathy;Low vocal intensity Volitional Cough: Strong Volitional Swallow: Able to elicit    Oral/Motor/Sensory Function Overall Oral Motor/Sensory Function: Mild impairment Facial ROM: Reduced right;Suspected CN VII (facial) dysfunction Facial Symmetry: Abnormal symmetry right;Suspected CN VII (facial) dysfunction Facial Strength: Reduced right;Suspected CN VII (facial) dysfunction Facial Sensation: Reduced right;Suspected CN V (Trigeminal) dysfunction Lingual ROM: Within Functional Limits Lingual Symmetry: Within  Functional Limits Lingual Strength: Within Functional Limits Lingual Sensation: Within Functional Limits Velum: Within Functional Limits Mandible: Within Functional Limits   Ice Chips Ice chips: Within functional limits Presentation: Spoon   Thin Liquid Thin Liquid: Impaired Presentation: Cup;Straw;Self Fed Oral Phase Impairments: Reduced labial seal Oral Phase Functional Implications: Right anterior spillage;Oral residue Pharyngeal  Phase Impairments: Suspected delayed Swallow    Nectar Thick Nectar Thick Liquid: Not tested   Honey Thick Honey Thick Liquid: Not tested   Puree Puree: Impaired Presentation: Spoon;Self Fed Oral Phase Impairments: Reduced labial seal Oral Phase Functional Implications: Oral residue;Right anterior spillage   Solid   GO   Solid: Not tested       Harlon Ditty, MA CCC-SLP [phone]  [place] Souter, Riley Nearing 06/30/2016,9:28 AM

## 2016-07-01 ENCOUNTER — Inpatient Hospital Stay (HOSPITAL_COMMUNITY): Payer: Medicare Other

## 2016-07-01 DIAGNOSIS — N185 Chronic kidney disease, stage 5: Secondary | ICD-10-CM | POA: Insufficient documentation

## 2016-07-01 DIAGNOSIS — I6789 Other cerebrovascular disease: Secondary | ICD-10-CM

## 2016-07-01 DIAGNOSIS — I639 Cerebral infarction, unspecified: Secondary | ICD-10-CM | POA: Insufficient documentation

## 2016-07-01 DIAGNOSIS — J9601 Acute respiratory failure with hypoxia: Secondary | ICD-10-CM | POA: Insufficient documentation

## 2016-07-01 LAB — ECHOCARDIOGRAM COMPLETE
E decel time: 275 msec
E/e' ratio: 24.63
FS: 24 % — AB (ref 28–44)
Height: 64 in
IVS/LV PW RATIO, ED: 0.8
LA ID, A-P, ES: 44 mm
LA diam end sys: 44 mm
LA diam index: 2.34 cm/m2
LA vol A4C: 88.3 ml
LA vol index: 44.1 mL/m2
LA vol: 83.1 mL
LV E/e' medial: 24.63
LV E/e'average: 24.63
LV PW d: 10.3 mm — AB (ref 0.6–1.1)
LV e' LATERAL: 6.09 cm/s
LVOT area: 2.84 cm2
LVOT diameter: 19 mm
Lateral S' vel: 11.3 cm/s
MV Dec: 275
MV Peak grad: 9 mmHg
MV pk A vel: 114 m/s
MV pk E vel: 150 m/s
PISA EROA: 0.08 cm2
RV sys press: 62 mmHg
Reg peak vel: 385 cm/s
TAPSE: 29 mm
TDI e' lateral: 6.09
TDI e' medial: 3.81
TR max vel: 385 cm/s
VTI: 159 cm
Weight: 2705.49 oz

## 2016-07-01 LAB — CBC WITH DIFFERENTIAL/PLATELET
BASOS ABS: 0 10*3/uL (ref 0.0–0.1)
BASOS PCT: 0 %
Eosinophils Absolute: 0.1 10*3/uL (ref 0.0–0.7)
Eosinophils Relative: 1 %
HEMATOCRIT: 32.9 % — AB (ref 36.0–46.0)
HEMOGLOBIN: 9.8 g/dL — AB (ref 12.0–15.0)
LYMPHS PCT: 12 %
Lymphs Abs: 0.8 10*3/uL (ref 0.7–4.0)
MCH: 29.6 pg (ref 26.0–34.0)
MCHC: 29.8 g/dL — ABNORMAL LOW (ref 30.0–36.0)
MCV: 99.4 fL (ref 78.0–100.0)
MONO ABS: 0.2 10*3/uL (ref 0.1–1.0)
Monocytes Relative: 3 %
Neutro Abs: 5.9 10*3/uL (ref 1.7–7.7)
Neutrophils Relative %: 84 %
Platelets: 187 10*3/uL (ref 150–400)
RBC: 3.31 MIL/uL — ABNORMAL LOW (ref 3.87–5.11)
RDW: 15.7 % — ABNORMAL HIGH (ref 11.5–15.5)
WBC: 7 10*3/uL (ref 4.0–10.5)

## 2016-07-01 LAB — BASIC METABOLIC PANEL
ANION GAP: 10 (ref 5–15)
BUN: 64 mg/dL — ABNORMAL HIGH (ref 6–20)
CHLORIDE: 101 mmol/L (ref 101–111)
CO2: 29 mmol/L (ref 22–32)
Calcium: 10 mg/dL (ref 8.9–10.3)
Creatinine, Ser: 4.93 mg/dL — ABNORMAL HIGH (ref 0.44–1.00)
GFR calc non Af Amer: 7 mL/min — ABNORMAL LOW (ref >60)
GFR, EST AFRICAN AMERICAN: 8 mL/min — AB (ref >60)
GLUCOSE: 73 mg/dL (ref 65–99)
POTASSIUM: 3.8 mmol/L (ref 3.5–5.1)
Sodium: 140 mmol/L (ref 135–145)

## 2016-07-01 LAB — GLUCOSE, CAPILLARY
GLUCOSE-CAPILLARY: 87 mg/dL (ref 65–99)
GLUCOSE-CAPILLARY: 121 mg/dL — AB (ref 65–99)
GLUCOSE-CAPILLARY: 133 mg/dL — AB (ref 65–99)
Glucose-Capillary: 84 mg/dL (ref 65–99)
Glucose-Capillary: 103 mg/dL — ABNORMAL HIGH (ref 65–99)
Glucose-Capillary: 127 mg/dL — ABNORMAL HIGH (ref 65–99)

## 2016-07-01 MED ORDER — INSULIN ASPART 100 UNIT/ML ~~LOC~~ SOLN
0.0000 [IU] | Freq: Three times a day (TID) | SUBCUTANEOUS | Status: DC
  Administered 2016-07-01 (×2): 1 [IU] via SUBCUTANEOUS

## 2016-07-01 MED ORDER — INSULIN ASPART 100 UNIT/ML ~~LOC~~ SOLN
0.0000 [IU] | Freq: Three times a day (TID) | SUBCUTANEOUS | Status: DC
  Administered 2016-07-02: 1 [IU] via SUBCUTANEOUS
  Administered 2016-07-02: 2 [IU] via SUBCUTANEOUS
  Administered 2016-07-03: 3 [IU] via SUBCUTANEOUS
  Administered 2016-07-03: 2 [IU] via SUBCUTANEOUS
  Administered 2016-07-04: 1 [IU] via SUBCUTANEOUS
  Administered 2016-07-04: 2 [IU] via SUBCUTANEOUS

## 2016-07-01 NOTE — Progress Notes (Signed)
Occupational Therapy Evaluation Patient Details Name: Dorothy Bartlett MRN: [id number] DOB: [birth year] Today's Date: 07/01/2016    History of Present Illness Dorothy Bartlett is a 82 y.o. female with medical history significant of hyperlipidemia, coronary artery stenosis, hypertension, CAD, chronic combined systolic and diastolic heart failure, PVD,obesity, intrinsic asthma, stage V chronic kidney disease was brought to the emergency department after she was found by her daughter on the front porch with right-sided facial droop, drooling and slurred speech and right-sided upper extremity paresis. The patient was placed on BiPAP ventilation due to dyspnea and hypoxia. CT was negative CXR showed mild CHF.    Clinical Impression   PTA, pt lived alone, with family staying at night and checking on her during the day and was independent with ADL and mobility. Pt currently requires Min A with mobility (limited to transfers) and Mod A with ADL. Pt is an excellent CIR candidate and is very motivated to return to PLOF. Will follow acutely to address established goals and facilitate D/c to next venue of care. Daughter present during eval and was very Adult nurse.     Follow Up Recommendations  CIR;Supervision/Assistance - 24 hour    Equipment Recommendations  3 in 1 bedside comode    Recommendations for Other Services Rehab consult     Precautions / Restrictions Precautions Precautions: Fall Restrictions Weight Bearing Restrictions: No      Mobility Bed Mobility Overal bed mobility: Needs Assistance Bed Mobility: Supine to Sit;Sit to Supine     Supine to sit: Min assist Sit to supine: Min assist      Transfers Overall transfer level: Needs assistance Equipment used: 1 person hand held assist (chair)   Sit to Stand: Min assist         General transfer comment: min A to side step to the L. No buckling of R knee noted. Good ability to problem solve and follow commands    Balance      Sitting balance-Leahy Scale: Fair Sitting balance - Comments: Able to weight shift and maintain midline in sitting     Standing balance-Leahy Scale: Poor                              ADL Overall ADL's : Needs assistance/impaired Eating/Feeding: Set up;Supervision/ safety;Sitting   Grooming: Minimal assistance;Sitting   Upper Body Bathing: Minimal assitance;Sitting   Lower Body Bathing: Moderate assistance;Sit to/from stand   Upper Body Dressing : Moderate assistance   Lower Body Dressing: Moderate assistance;Sit to/from stand   Toilet Transfer: Minimal assistance;Stand-pivot   Toileting- Clothing Manipulation and Hygiene: Moderate assistance       Functional mobility during ADLs: Minimal assistance (sit stand and side step only) General ADL Comments: Pt motivated to become independent again     Vision Vision Assessment?:  (will further assess. Baseline visual deficits)   Perception Perception Comments: will further assess   Praxis Praxis Praxis tested?: Within functional limits    Pertinent Vitals/Pain Pain Assessment: No/denies pain     Hand Dominance Right   Extremity/Trunk Assessment Upper Extremity Assessment Upper Extremity Assessment: RUE deficits/detail RUE Deficits / Details: isolated movment presnt in RUE. Able to lift R UE up from bed against gravity and extend elbow against gravitiy.[name] active wrist extension presnt against gravity. AFter facilitation. Pt able to initiate grasp, although nonfunctional. Pt initiating trying to use RUE during functional mobility for trnasfers.  RUE Sensation: decreased light touch RUE Coordination: decreased fine  motor;decreased gross motor   Lower Extremity Assessment Lower Extremity Assessment: Defer to PT evaluation RLE Deficits / Details: Strength grossly 4/5 proximally, difficulty following testing commands. Able to bear weight functionally although with ataxic movements RLE Coordination: decreased  fine motor;decreased gross motor   Cervical / Trunk Assessment Cervical / Trunk Assessment: Normal   Communication Communication Communication: Expressive difficulties   Cognition Arousal/Alertness: Awake/alert Behavior During Therapy: Flat affect Overall Cognitive Status: Impaired/Different from baseline Area of Impairment: Problem solving;Attention   Current Attention Level: Selective   Following Commands: Follows one step commands consistently     Problem Solving: Slow processing;Requires verbal cues  Will further assess. Doing well.     General Comments       Exercises Exercises: Other exercises Other Exercises Other Exercises: encouraged movment of RUE Other Exercises: RUE positioned on pillow to keep RUE in view at all times   Shoulder Instructions      Home Living Family/patient expects to be discharged to:: Private residence Living Arrangements: Alone (Dtr visits daily, nephew stays at night sometimes) Available Help at Discharge: Family (potentially 24/7 assist) Type of Home: House Home Access: Stairs to enter Entergy CorporationEntrance Stairs-Number of Steps: 2 Entrance Stairs-Rails: None Home Layout: One level     Bathroom Shower/Tub: Chief Strategy OfficerTub/shower unit   Bathroom Toilet: Standard     Home Equipment: Environmental consultantWalker - 2 wheels;Wheelchair - Fluor Corporationmanual;Bedside commode;Shower seat;Tub bench      Lives With: Alone (grandson stays at night. Daughter cooks for her and checks o)    Prior Functioning/Environment Level of Independence: Needs assistance  Gait / Transfers Assistance Needed: Independent, furniture walks, walker for short distances outdoors, w/c long distances ADL's / Homemaking Assistance Needed: Able to independently bath/dress herself, family assisted with cooking only due to vision problems Communication / Swallowing Assistance Needed: normal PTA      OT Diagnosis: Generalized weakness;Cognitive deficits;Hemiplegia dominant side   OT Problem List: Decreased  strength;Decreased range of motion;Decreased activity tolerance;Impaired balance (sitting and/or standing);Decreased coordination;Decreased safety awareness;Decreased cognition;Decreased knowledge of use of DME or AE;Cardiopulmonary status limiting activity;Impaired sensation;Obesity;Impaired UE functional use;Impaired vision/perception   OT Treatment/Interventions: Self-care/ADL training;Therapeutic exercise;Neuromuscular education;DME and/or AE instruction;Energy conservation;Therapeutic activities;Cognitive remediation/compensation;Visual/perceptual remediation/compensation;Patient/family education;Balance training    OT Goals(Current goals can be found in the care plan section) Acute Rehab OT Goals Patient Stated Goal: to be independent again OT Goal Formulation: With patient Time For Goal Achievement: 07/15/16 Potential to Achieve Goals: Good ADL Goals Pt Will Perform Upper Body Bathing: with set-up;with supervision;sitting Pt Will Perform Upper Body Dressing: with min guard assist;sitting Pt Will Transfer to Toilet: with min guard assist;bedside commode;ambulating Pt Will Perform Toileting - Clothing Manipulation and hygiene: with supervision;sitting/lateral leans Pt/caregiver will Perform Home Exercise Program: Right Upper extremity;With written HEP provided;With Supervision (with caregiver assisting) Additional ADL Goal #1: Pt will use R UE as funcitonal assist during aDL tasks  OT Frequency: Min 2X/week   Barriers to D/C:            Co-evaluation              End of Session Equipment Utilized During Treatment: Gait belt Nurse Communication: Mobility status  Activity Tolerance: Patient tolerated treatment well Patient left: in bed;with call bell/phone within reach;with bed alarm set;with family/visitor present   Time: 1635-1710 OT Time Calculation (min): 35 min Charges:  OT General Charges $OT Visit: 1 Procedure OT Evaluation $OT Eval Moderate Complexity: 1  Procedure OT Treatments $Self Care/Home Management : 8-22 mins G-Codes:    Andrik Sandt,HILLARY 07/01/2016,  5:27 PM   [facility] Centerilary Dmani Mizer, OTR/L  [phone] 07/01/2016

## 2016-07-01 NOTE — Progress Notes (Signed)
  Echocardiogram 2D Echocardiogram has been performed.  Delcie RochENNINGTON, Keagan Brislin 07/01/2016, 3:45 PM

## 2016-07-01 NOTE — Progress Notes (Signed)
PROGRESS NOTE  Dorothy Bartlett  RUE:[id number] DOB: 03/02/1926  DOA: 06/29/2016 PCP: Eliott Nine, MD  Primary Nephrologist: Dr. Elvis Coil   Brief Narrative:  82 y.o. female with medical history significant of hyperlipidemia, hypertension, DM 2, multivessel CAD, chronic combined systolic and diastolic heart failure, permanent pacemaker for high degree AV block, PVD,obesity, intrinsic asthma, stage V chronic kidney disease was brought to the emergency department after she was found by her daughter on the front porch with right-sided facial droop, drooling and slurred speech and right-sided upper extremity paresis. Code stroke was activated. Imaging does not show acute stroke. Admitted to stepdown due to acute respiratory failure, on BiPAP. Neurology consulting for stroke workup. Nephrology consulted. Respiratory status improved-has been on oxygen via nasal cannula since 7/1 AM and has not required BiPAP.   Assessment & Plan:   Principal Problem:   Acute CVA (cerebrovascular accident) (HCC) Active Problems:   Hyperlipidemia   CAD S/P PTCA 1995   Intrinsic asthma   Chronic combined systolic and diastolic heart failure (HCC)   Type 2 diabetes mellitus with renal manifestations (HCC)   Essential hypertension   AKI (acute kidney injury) (HCC)   Suspected acute left CVA  - Residual dysarthria, facial asymmetry and right hemiparesis. - CTA head 06/29/16: Small vessel disease changes without CT evidence of large acute infarct. Unable to do MRI due to pacemaker. Hence repeated CT head 07/01/16: No intracranial hemorrhage or CT evidence of large acute infarct. - CTA head and neck: Moderate to marked narrowing bilateral ICA -  - Rest of stroke workup: 2-D echo, A1c: pending. - LDL 83. - On aspirin 81 MG daily prior to admission, now on Plavix for secondary stroke prophylaxis. Aspirin not continued as per discussion with stroke M.D. - Neurology consultation appreciated. Await stroke team  follow-up. - PT, OT and ST evaluation. PT and ST recommend inpatient rehabilitation-ordered.  Acute respiratory failure with hypoxia - Precipitated by acute on chronic combined CHF, underlying severe pulmonary hypertension and asthma. - Admitted to stepdown and placed on BiPAP. Started IV Lasix and increased by nephrology. - 2-D echo 05/09/15: LVEF 40-45 percent, aortic sclerosis, moderate MR, PA peak pressures 72 mmHg. - -1310 mL since admission. - Treat underlying cause and titrate oxygen to maintain saturations >92%. BiPAP when necessary. Improved. Now on 2 L/m oxygen via nasal cannula and saturating in the mid 90s.  Acute on chronic combined CHF - Continue IV Lasix and nitrates. Check repeat 2-D echo. Some of volume overload related to advanced chronic kidney disease but probably not HD candidate. - Held carvedilol secondary to acute CHF. Improving on IV Lasix.  Acute on stage V chronic kidney disease - Last creatinine prior to this admission on 07/05/15:3.39. Admitted with creatinine of 5.19 > 5.29. Either has acute kidney injury or progression of chronic kidney disease. Likely not candidate for HD - Did receive contrast for CTA head and neck on 6/30. - Nephrology consultation appreciated. Creatinine down to 4.93.  Hyperlipidemia LDL 83. Lipitor increased to 80 mg daily from 40 MG daily.  CTA status post PTCA 1995 - Continue statins. Aspirin changed to Plavix.   Asthma - Bronchodilators. No clinical bronchospasm or features suggestive of infectious exacerbation.   DM 2 with renal manifestations - Diabetic diet. SSI.  Essential hypertension - Continue hydralazine and IV Lasix. Hold carvedilol. Reasonable control.  Right ICA stenosis >75%.  Right lung abnormalities - Follow-up CT chest in 3 months. - This changes were seen on CTA neck. Bilateral pleural  effusions greater on right. Lung parenchymal changes partially reflective of pulmonary vascular congestion however, groundglass  opacities throughout the right lung apex including 1.4 cm rounded consolidation. This appearance is atypical for congestive heart and hence follow-up CT recommended by radiology.  S/P permanent pacemaker.  Dysphagia - Secondary to CVA. As per speech therapy, dysphagia 1 diet and thin liquids.  Anemia in chronic kidney disease - Stable.    DVT prophylaxis: Lovenox Code Status: Full Family Communication: Discussed with patient. No family at bedside.  Disposition Plan: DC home when medically stable. Admitted to stepdown unit. Transfer to telemetry 7/2.   Consultants:   Neurology   Nephrology  Procedures:   BIPAP  Antimicrobials:   None    Subjective: States that she's feeling better. Denies dyspnea. As per RN, speech more clearer, improved strength in right extremities and able to get to bedside commode with assistance.  Objective:  Filed Vitals:   07/01/16 0400 07/01/16 0500 07/01/16 0600 07/01/16 0835  BP: 147/62 145/65 132/69 136/54  Pulse: 67 67 70 66  Temp:    98.7 F (37.1 C)  TempSrc:    Oral  Resp: 24 16 20 19   Height:      Weight:      SpO2: 100% 100% 92% 96%    Intake/Output Summary (Last 24 hours) at 07/01/16 0927 Last data filed at 07/01/16 0500  Gross per 24 hour  Intake    240 ml  Output   1275 ml  Net  -1035 ml   Filed Weights   06/29/16 2300 06/30/16 0400 07/01/16 0330  Weight: 78.3 kg (172 lb 9.9 oz) 78.1 kg (172 lb 2.9 oz) 76.7 kg (169 lb 1.5 oz)    Examination:  General exam: Pleasant elderly female, lying comfortably propped up in bed. Respiratory system: Occasional basal crackles but otherwise clear to auscultation without wheezing, rhonchi. [name]. Respiratory effort normal. Cardiovascular system: S1 & S2 heard, RRR. No JVD, murmurs, rubs, gallops or clicks. 1+ pedal edema. Telemetry: AV paced rhythm.  Gastrointestinal system: Abdomen is nondistended, soft and nontender. No organomegaly or masses felt. Normal bowel sounds  heard. Central nervous system: Alert and oriented. Dysarthria, facial asymmetry - these seem to be better than 7/1. Extremities: grade 4+ x 5 power at least in left limbs. Right pronator drift. Right upper extremity 4 x 5 power in right lower extremity 2 x 5 power.  Skin: No rashes, lesions or ulcers Psychiatry: Judgement and insight appear normal. Mood & affect appropriate.     Data Reviewed: I have personally reviewed following labs and imaging studies  CBC:  Recent Labs Lab 06/29/16 1711 06/29/16 1712 06/30/16 0731 07/01/16 0346  WBC 5.7  --  6.2 7.0  NEUTROABS 4.1  --  4.5 5.9  HGB 10.0* 11.2* 10.0* 9.8*  HCT 32.0* 33.0* 33.5* 32.9*  MCV 98.2  --  98.8 99.4  PLT 217  --  212 187   Basic Metabolic Panel:  Recent Labs Lab 06/29/16 1711 06/29/16 1712 06/30/16 0731 07/01/16 0346  NA 137 139 142 140  K 4.2 4.2 4.0 3.8  CL 99* 99* 100* 101  CO2 26  --  29 29  GLUCOSE 170* 165* 80 73  BUN 65* 61* 64* 64*  CREATININE 5.19* 5.20* 5.29* 4.93*  CALCIUM 10.4*  --  10.2 10.0  MG  --   --  2.6*  --    GFR: Estimated Creatinine Clearance: 7.9 mL/min (by C-G formula based on Cr of 4.93). Liver Function Tests:  Recent Labs Lab 06/29/16 1711  AST 26  ALT 18  ALKPHOS 56  BILITOT 1.0  PROT 7.2  ALBUMIN 3.8   No results for input(s): LIPASE, AMYLASE in the last 168 hours. No results for input(s): AMMONIA in the last 168 hours. Coagulation Profile:  Recent Labs Lab 06/29/16 1711  INR 1.21   Cardiac Enzymes: No results for input(s): CKTOTAL, CKMB, CKMBINDEX, TROPONINI in the last 168 hours. BNP (last 3 results) No results for input(s): PROBNP in the last 8760 hours. HbA1C: No results for input(s): HGBA1C in the last 72 hours. CBG:  Recent Labs Lab 06/30/16 1717 06/30/16 2352 07/01/16 0330 07/01/16 0503 07/01/16 0836  GLUCAP 156* 114* 84 87 103*   Lipid Profile:  Recent Labs  06/30/16 0731  CHOL 156  HDL 49  LDLCALC 83  TRIG 120  CHOLHDL 3.2    Thyroid Function Tests: No results for input(s): TSH, T4TOTAL, FREET4, T3FREE, THYROIDAB in the last 72 hours. Anemia Panel: No results for input(s): VITAMINB12, FOLATE, FERRITIN, TIBC, IRON, RETICCTPCT in the last 72 hours.  Sepsis Labs: No results for input(s): PROCALCITON, LATICACIDVEN in the last 168 hours.  No results found for this or any previous visit (from the past 240 hour(s)).       Radiology Studies: Ct Angio Head W Or Wo Contrast  06/29/2016  CLINICAL DATA:  82 year old female with right-sided weakness and facial droop. EXAM: CT ANGIOGRAPHY HEAD AND NECK TECHNIQUE: Multidetector CT imaging of the head and neck was performed using the standard protocol during bolus administration of intravenous contrast. Multiplanar CT image reconstructions and MIPs were obtained to evaluate the vascular anatomy. Carotid stenosis measurements (when applicable) are obtained utilizing NASCET criteria, using the distal internal carotid diameter as the denominator. CONTRAST:  50 cc Isovue 370. This decision was made by the neurologist based on risk benefit ratio and the fact the patient cannot have an MR scan because of pacemaker. COMPARISON:  06/29/2016 head CT. FINDINGS: CT HEAD Brain: Small vessel disease changes without CT evidence of large acute infarct. No intracranial mass or abnormal enhancement. Global mild atrophy without hydrocephalus. Calvarium and skull base: Negative. Paranasal sinuses: Minimal left maxillary sinus mucosal thickening. Orbits: No acute abnormality. CTA NECK Aortic arch: Calcified plaque without high-grade stenosis. Three vessel arch. Right carotid system: Plaque proximal right internal carotid artery with greater than 75% diameter stenosis. Left carotid system: Ectatic left carotid artery with bifurcation posterior to the pharynx. No hemodynamically significant stenosis of the left internal carotid artery. Vertebral arteries:Mild narrowing origin of vertebral arteries  bilaterally. Left vertebral artery dominant in size. Skeleton: Multilevel cervical spondylotic changes with various degrees of spinal stenosis and foraminal narrowing. Limbus vertebra C7/protrusion with marked canal narrowing. Other neck: Bilateral pleural effusions greater on right. Lung parenchymal changes partially reflective of pulmonary vascular congestion however, ground-glass opacities throughout the right lung apex including 1.4 cm rounded consolidation. This appearance is atypical for congestive heart. Recommend follow-up chest CT in 3 months after treatment of any congestive heart failure or infectious infiltrate. CTA HEAD Anterior circulation: Prominent calcification cavernous segment internal carotid artery bilaterally with moderate to marked narrowing. Mild narrowing left middle cerebral artery M1 segment. Moderate narrowing right middle cerebral artery M1 segment. Moderate to marked narrowing A1 segment right anterior cerebral artery. Posterior circulation: Left vertebral artery is dominant. Moderate to marked narrowing vertebral arteries at the level of the foramen magnum. Mild narrowing and irregularity basilar artery. Moderate narrowing proximal mid and distal aspect posterior cerebral artery bilaterally. Venous  sinuses: Patent. Anatomic variants: Negative. Delayed phase: Negative IMPRESSION: CT Small vessel disease changes without CT evidence of large acute infarct. No intracranial mass or abnormal enhancement. Global mild atrophy without hydrocephalus. CTA NECK Aortic atherosclerosis. Plaque proximal right internal carotid artery with greater than 75% diameter stenosis. Ectatic left carotid artery with bifurcation posterior to the pharynx. No hemodynamically significant stenosis of the left internal carotid artery. Mild narrowing origin of vertebral arteries bilaterally. Left vertebral artery dominant in size. Multilevel cervical spondylotic changes with various degrees of spinal stenosis and  foraminal narrowing. Limbus vertebra C7/protrusion with marked canal narrowing. Bilateral pleural effusions greater on right. Lung parenchymal changes partially reflective of pulmonary vascular congestion however, ground-glass opacities throughout the right lung apex including 1.4 cm rounded consolidation. This appearance is atypical for congestive heart. Recommend follow-up chest CT in 3 months after treatment of any congestive heart failure or infectious infiltrate. CTA HEAD Prominent calcification cavernous segment internal carotid artery bilaterally with moderate to marked narrowing. Mild narrowing left middle cerebral artery M1 segment. Moderate narrowing right middle cerebral artery M1 segment. Moderate to marked narrowing A1 segment right anterior cerebral artery. Left vertebral artery is dominant. Moderate to marked narrowing vertebral arteries at the level of the foramen magnum. Mild narrowing and irregularity basilar artery. Moderate narrowing proximal mid and distal aspect posterior cerebral artery bilaterally. These results were called by telephone at the time of interpretation on 06/29/2016 at 5:43 pm to Dr. Derwood Kaplan , who verbally acknowledged these results. Electronically Signed   By: Lacy Duverney M.D.   On: 06/29/2016 18:31   Ct Head Wo Contrast  07/01/2016  CLINICAL DATA:  82 year old hypertensive female with right-sided weakness and facial droop. Subsequent encounter. EXAM: CT HEAD WITHOUT CONTRAST TECHNIQUE: Contiguous axial images were obtained from the base of the skull through the vertex without intravenous contrast. COMPARISON:  06/29/2016 CT angiogram and head CT. FINDINGS: No intracranial hemorrhage. Prominent chronic microvascular changes without CT evidence of large acute infarct. Tiny calcification left frontal lobe unchanged of indeterminate etiology. Prominent peri vascular space versus remote infarct right basal ganglia. Moderate global atrophy without hydrocephalus. No  intracranial mass lesion noted on this unenhanced exam. Vascular calcifications. Exophthalmos. Minimal mucosal thickening left maxillary sinus. IMPRESSION: No intracranial hemorrhage or CT evidence of large acute infarct. Prominent chronic microvascular changes. Atrophy. Vascular calcifications. Exophthalmos. Electronically Signed   By: Lacy Duverney M.D.   On: 07/01/2016 08:02   Ct Angio Neck W Or Wo Contrast  06/29/2016  CLINICAL DATA:  82 year old female with right-sided weakness and facial droop. EXAM: CT ANGIOGRAPHY HEAD AND NECK TECHNIQUE: Multidetector CT imaging of the head and neck was performed using the standard protocol during bolus administration of intravenous contrast. Multiplanar CT image reconstructions and MIPs were obtained to evaluate the vascular anatomy. Carotid stenosis measurements (when applicable) are obtained utilizing NASCET criteria, using the distal internal carotid diameter as the denominator. CONTRAST:  50 cc Isovue 370. This decision was made by the neurologist based on risk benefit ratio and the fact the patient cannot have an MR scan because of pacemaker. COMPARISON:  06/29/2016 head CT. FINDINGS: CT HEAD Brain: Small vessel disease changes without CT evidence of large acute infarct. No intracranial mass or abnormal enhancement. Global mild atrophy without hydrocephalus. Calvarium and skull base: Negative. Paranasal sinuses: Minimal left maxillary sinus mucosal thickening. Orbits: No acute abnormality. CTA NECK Aortic arch: Calcified plaque without high-grade stenosis. Three vessel arch. Right carotid system: Plaque proximal right internal carotid artery with greater than  75% diameter stenosis. Left carotid system: Ectatic left carotid artery with bifurcation posterior to the pharynx. No hemodynamically significant stenosis of the left internal carotid artery. Vertebral arteries:Mild narrowing origin of vertebral arteries bilaterally. Left vertebral artery dominant in size.  Skeleton: Multilevel cervical spondylotic changes with various degrees of spinal stenosis and foraminal narrowing. Limbus vertebra C7/protrusion with marked canal narrowing. Other neck: Bilateral pleural effusions greater on right. Lung parenchymal changes partially reflective of pulmonary vascular congestion however, ground-glass opacities throughout the right lung apex including 1.4 cm rounded consolidation. This appearance is atypical for congestive heart. Recommend follow-up chest CT in 3 months after treatment of any congestive heart failure or infectious infiltrate. CTA HEAD Anterior circulation: Prominent calcification cavernous segment internal carotid artery bilaterally with moderate to marked narrowing. Mild narrowing left middle cerebral artery M1 segment. Moderate narrowing right middle cerebral artery M1 segment. Moderate to marked narrowing A1 segment right anterior cerebral artery. Posterior circulation: Left vertebral artery is dominant. Moderate to marked narrowing vertebral arteries at the level of the foramen magnum. Mild narrowing and irregularity basilar artery. Moderate narrowing proximal mid and distal aspect posterior cerebral artery bilaterally. Venous sinuses: Patent. Anatomic variants: Negative. Delayed phase: Negative IMPRESSION: CT Small vessel disease changes without CT evidence of large acute infarct. No intracranial mass or abnormal enhancement. Global mild atrophy without hydrocephalus. CTA NECK Aortic atherosclerosis. Plaque proximal right internal carotid artery with greater than 75% diameter stenosis. Ectatic left carotid artery with bifurcation posterior to the pharynx. No hemodynamically significant stenosis of the left internal carotid artery. Mild narrowing origin of vertebral arteries bilaterally. Left vertebral artery dominant in size. Multilevel cervical spondylotic changes with various degrees of spinal stenosis and foraminal narrowing. Limbus vertebra C7/protrusion with  marked canal narrowing. Bilateral pleural effusions greater on right. Lung parenchymal changes partially reflective of pulmonary vascular congestion however, ground-glass opacities throughout the right lung apex including 1.4 cm rounded consolidation. This appearance is atypical for congestive heart. Recommend follow-up chest CT in 3 months after treatment of any congestive heart failure or infectious infiltrate. CTA HEAD Prominent calcification cavernous segment internal carotid artery bilaterally with moderate to marked narrowing. Mild narrowing left middle cerebral artery M1 segment. Moderate narrowing right middle cerebral artery M1 segment. Moderate to marked narrowing A1 segment right anterior cerebral artery. Left vertebral artery is dominant. Moderate to marked narrowing vertebral arteries at the level of the foramen magnum. Mild narrowing and irregularity basilar artery. Moderate narrowing proximal mid and distal aspect posterior cerebral artery bilaterally. These results were called by telephone at the time of interpretation on 06/29/2016 at 5:43 pm to Dr. Derwood Kaplan , who verbally acknowledged these results. Electronically Signed   By: Lacy Duverney M.D.   On: 06/29/2016 18:31   Dg Chest Port 1 View  06/29/2016  CLINICAL DATA:  Right-sided weakness and aphasia EXAM: PORTABLE CHEST 1 VIEW COMPARISON:  07/05/2015 FINDINGS: Pacing device is again identified and stable. Cardiomegaly is again noted. Aortic calcifications are seen and stable. The lungs are well aerated bilaterally. Mild congestive failure is again noted. No focal infiltrate is seen. IMPRESSION: Mild CHF. Aortic atherosclerosis. Electronically Signed   By: Alcide Clever M.D.   On: 06/29/2016 18:30   Ct Head Code Stroke W/o Cm  06/29/2016  CLINICAL DATA:  Code stroke.  Right-sided weakness and facial droop. EXAM: CT HEAD WITHOUT CONTRAST TECHNIQUE: Contiguous axial images were obtained from the base of the skull through the vertex without  intravenous contrast. COMPARISON:  05/03/2005 FINDINGS: Brain: No evidence of  cortical infarction, hemorrhage, hydrocephalus, or mass lesion/mass effect. Patchy white matter ischemic gliosis that has progressed since previous, overall mild for age. There have been lacunar infarcts in the bilateral thalamus. Dilated perivascular space below the right putamen. Aspects is 10. Vascular: No hyperdense vessel. Calcified intracranial atherosclerosis Skull: Negative for fracture or focal lesion. Sinuses/Orbits: No acute finding. Other: None. Attempt to call at the time of interpretation on 06/29/2016 at 5:29 and 5:34 pm to Dr. Nicholas Lose, but cell phone not working [phone]). These results were called by telephone at the time of interpretation on 06/29/2016 at 5:36 pm to Dr. Tyrone Apple , who verbally acknowledged these results. IMPRESSION: 1. No intracranial hemorrhage or definite acute infarct. 2. Chronic microvascular disease and mild for age atrophy. Electronically Signed   By: Marnee Spring M.D.   On: 06/29/2016 17:37        Scheduled Meds: . atorvastatin  40 mg Oral q morning - 10a  . clopidogrel  75 mg Oral Daily  . enoxaparin (LOVENOX) injection  30 mg Subcutaneous Q24H  . furosemide  60 mg Intravenous BID  . hydrALAZINE  10 mg Oral TID  . insulin aspart  0-9 Units Subcutaneous Q4H  . isosorbide mononitrate  30 mg Oral Daily  . paricalcitol  1 mcg Oral Daily   Continuous Infusions:    LOS: 2 days    Time spent: 45 minutes.    [facility], MD [facility] Pager [phone]  If 7PM-7AM, please contact night-coverage [email or web address] Password [facility] 07/01/2016, 9:27 AM

## 2016-07-01 NOTE — Progress Notes (Signed)
STROKE TEAM PROGRESS NOTE   HISTORY OF PRESENT ILLNESS (per record)  Dorothy Bartlett is an 82 y.o. female who presented with aphasia and right sided weakness. Last seen normal by daughter at 1:55 pm. She has a history of HTN, DM, CKD  Date last known well: Date: 06/30/2015 Time last known well: Time: 02:00 tPA Given: NO    SUBJECTIVE (INTERVAL HISTORY) No family member at the bedside. The patient appears much improved with no clear speech and only mild right hand weakness OBJECTIVE Temp:  [97.5 F (36.4 C)-99.6 F (37.6 C)] 98.3 F (36.8 C) (07/02 1213) Pulse Rate:  [58-70] 59 (07/02 1213) Cardiac Rhythm:  [-] Normal sinus rhythm (07/02 0700) Resp:  [15-24] 20 (07/02 1213) BP: (116-147)/(44-69) 147/60 mmHg (07/02 1213) SpO2:  [92 %-100 %] 100 % (07/02 1213) Weight:  [169 lb 1.5 oz (76.7 kg)] 169 lb 1.5 oz (76.7 kg) (07/02 0330)  CBC:   Recent Labs Lab 06/30/16 0731 07/01/16 0346  WBC 6.2 7.0  NEUTROABS 4.5 5.9  HGB 10.0* 9.8*  HCT 33.5* 32.9*  MCV 98.8 99.4  PLT 212 187    Basic Metabolic Panel:   Recent Labs Lab 06/30/16 0731 07/01/16 0346  NA 142 140  K 4.0 3.8  CL 100* 101  CO2 29 29  GLUCOSE 80 73  BUN 64* 64*  CREATININE 5.29* 4.93*  CALCIUM 10.2 10.0  MG 2.6*  --     Lipid Panel:     Component Value Date/Time   CHOL 156 06/30/2016 0731   TRIG 120 06/30/2016 0731   HDL 49 06/30/2016 0731   CHOLHDL 3.2 06/30/2016 0731   VLDL 24 06/30/2016 0731   LDLCALC 83 06/30/2016 0731   HgbA1c:  Lab Results  Component Value Date   HGBA1C 5.8* 05/08/2015   Urine Drug Screen: No results found for: LABOPIA, COCAINSCRNUR, LABBENZ, AMPHETMU, THCU, LABBARB    IMAGING  Ct Angio Head and Neck W Or Wo Contrast 06/29/2016    CT  Small vessel disease changes without CT evidence of large acute infarct. No intracranial mass or abnormal enhancement. Global mild atrophy without hydrocephalus.   CTA NECK  Aortic atherosclerosis. Plaque proximal right internal  carotid artery with greater than 75% diameter stenosis. Ectatic left carotid artery with bifurcation posterior to the pharynx. No hemodynamically significant stenosis of the left internal carotid artery. Mild narrowing origin of vertebral arteries bilaterally. Left vertebral artery dominant in size. Multilevel cervical spondylotic changes with various degrees of spinal stenosis and foraminal narrowing. Limbus vertebra C7/protrusion with marked canal narrowing. Bilateral pleural effusions greater on right. Lung parenchymal changes partially reflective of pulmonary vascular congestion however, ground-glass opacities throughout the right lung apex including 1.4 cm rounded consolidation. This appearance is atypical for congestive heart. Recommend follow-up chest CT in 3 months after treatment of any congestive heart failure or infectious infiltrate.   CTA HEAD  Prominent calcification cavernous segment internal carotid artery bilaterally with moderate to marked narrowing. Mild narrowing left middle cerebral artery M1 segment. Moderate narrowing right middle cerebral artery M1 segment. Moderate to marked narrowing A1 segment right anterior cerebral artery. Left vertebral artery is dominant. Moderate to marked narrowing vertebral arteries at the level of the foramen magnum. Mild narrowing and irregularity basilar artery. Moderate narrowing proximal mid and distal aspect posterior cerebral artery bilaterally.    Dg Chest Port 1 View 06/29/2016   Mild CHF. Aortic atherosclerosis.    Ct Head Code Stroke W/o Cm 06/29/2016   1. No intracranial hemorrhage  or definite acute infarct.  2. Chronic microvascular disease and mild for age atrophy.     PHYSICAL EXAM  Pleasant elderly african american lady not in distress. . Afebrile. Head is nontraumatic. Neck is supple without bruit.    Cardiac exam no murmur or gallop. Lungs are clear to auscultation. Distal pulses are well felt. Neurological Exam : Awake alert  oriented x 2 minimally dysarthric speech and no aphasia. Mild right lower face asymmetry. Tongue midline. Right upper  extremity drift 4/5 strength on right. With grip weakness.. Mild diminished fine finger movements on right Orbits left over right upper extremity. .Diminished right hemibody sensation . Gait deferred.           ASSESSMENT/PLAN Ms. Dorothy Bartlett is a 82 y.o. female with history of hyperlipidemia, carotid stenosis, hypertension, coronary artery disease, obesity, previous MI, congestive heart failure, permanent pacemaker, diabetes mellitus, and chronic kidney disease presenting with aphasia and right hemiparesis. She did not receive IV t-PA due to late presentation.  Stroke:  Dominant infarct of uncertain etiology.  Resultant- speech difficulties and right hemiparesis.  MRI  permanent pacemaker  MRA  permanent pacemaker  Carotid Doppler - refer to CTA of the neck.  2D Echo - pending  LDL - 83  HgbA1c pending  VTE prophylaxis - Lovenox DIET - DYS 1 Room service appropriate?: Yes; Fluid consistency:: Thin  aspirin 81 mg daily prior to admission, now on clopidogrel 75 mg daily  Patient counseled to be compliant with her antithrombotic medications  Ongoing aggressive stroke risk factor management  Therapy recommendations: Pending  Disposition: Pending  Hypertension  Stable  Permissive hypertension (OK if < 220/120) but gradually normalize in 5-7 days  Long-term BP goal normotensive  Hyperlipidemia  Home meds:  Lipitor 40 mg daily resumed in hospital  LDL 83, goal < 70  Increase Lipitor to 80 mg daily  Continue statin at discharge  Diabetes  HgbA1c pending, goal < 7.0  Uncontrolled  Other Stroke Risk Factors  Advanced age  Obesity, Body mass index is 29.01 kg/(m^2)., recommend weight loss, diet and exercise as appropriate   Coronary artery disease   Other Active Problems  Chronic kidney disease - creatinine 5.29 (not on  dialysis)  Right internal carotid artery stenosis greater than 75%  Right lung abnormalities - follow-up chest CT recommended in 3 months  Anemia Permanent pacemaker - no MRI - repeat head CT 07/01/16 also negative for acute infarct  Hospital day # 2  I have personally examined this patient, reviewed notes, independently viewed imaging studies, participated in medical decision making and plan of care. I have made any additions or clarifications directly to the above note.  She presented with speech difficulties and right hemiparesis due to suspected small left brain infarct from small vessel disease not seen on CT head x 2.  We cannot obtain MRI due to pacer.. Continue Lipitor dose   80 mg daily. D/w medical hospitalist.I spent a total of   15 Minutes in face to face in clinical consultation, greater than 50% of which was counseling/coordinating care about stroke risk, prevention and treatment  Delia HeadyPramod Hydie Langan, MD Medical Director [facility] Pager: [phone] 07/01/2016 1:07 PM    To contact Stroke Continuity provider, please refer to [email or web address]. After hours, contact General Neurology

## 2016-07-01 NOTE — Plan of Care (Signed)
Problem: Education: Goal: Knowledge of disease or condition will improve Outcome: Progressing Discussed with patient and family about plan of care tonight.  Patient's increase in lasix per MD order (See MAR) and how she wants to avoid the insertion of the foley if at all possible.

## 2016-07-01 NOTE — Progress Notes (Signed)
Utilization Review Completed.Shalae Belmonte T7/02/2016  

## 2016-07-01 NOTE — Progress Notes (Signed)
[facility] Progress Note   Subjective: no c/o, had 1300 cc urine out yest on higher dose IV lasix, on 2L [place] today  Filed Vitals:   07/01/16 0400 07/01/16 0500 07/01/16 0600 07/01/16 0835  BP: 147/62 145/65 132/69 136/54  Pulse: 67 67 70 66  Temp:    98.7 F (37.1 C)  TempSrc:    Oral  Resp: '24 16 20 19  '$ Height:      Weight:      SpO2: 100% 100% 92% 96%    Inpatient medications: . atorvastatin  40 mg Oral q morning - 10a  . clopidogrel  75 mg Oral Daily  . enoxaparin (LOVENOX) injection  30 mg Subcutaneous Q24H  . furosemide  60 mg Intravenous BID  . hydrALAZINE  10 mg Oral TID  . insulin aspart  0-9 Units Subcutaneous TID WC  . isosorbide mononitrate  30 mg Oral Daily  . paricalcitol  1 mcg Oral Daily     albuterol, loratadine, nitroGLYCERIN, ondansetron **OR** ondansetron (ZOFRAN) IV  Exam: Gen elderly AAF, R facial droop and dysarthria No rash, cyanosis or gangrene Sclera anicteric, throat clear  No jvd Chest faint rales bilat bases RRR distant HS, no M or rub heard Abd soft ntnd no mass or ascites +bs obese GU deferred MS no joint effusions or deformity. L leg scar from old fem-pop well healed Ext trace bilat pretib edema / no wounds or ulcers Neuro is alert, responsive, R hemiparesis and facial droop   Date CreateGFR 2009 1.88 20152.8- 3.511- 14 20163.4- 5.46- 11 06/29/16 5.20 7/1/175.297  ECHO May '16 - LVEF 40-45%, severe pulm HTN, focal WMA of septal/ distal ant and distal inf/ apical walls CXR - mild CHF       Assessment: 1 Acute on CKD5 - in setting of CHF and new CVA. CKD progression and/or cardiorenal. Diuresing and creat improved a little.  Not dialysis candidate given comorbidities.  No other suggestions, diurese as tol by BP and pt can f/u w [facility] in office.  Will sign off.  2 Pulm edema  - mild, resp status improving 3 Acute CVA / R hemiparesis 4 HTN - stable, getting hydralazine and lasix here (home coreg no hold) 5 Hx systolic CHF  Plan - as above   Kelly Splinter MD [facility] pager [phone]    cell [phone] 07/01/2016, 11:30 AM    Recent Labs Lab 06/29/16 1711 06/29/16 1712 06/30/16 0731 07/01/16 0346  NA 137 139 142 140  K 4.2 4.2 4.0 3.8  CL 99* 99* 100* 101  CO2 26  --  29 29  GLUCOSE 170* 165* 80 73  BUN 65* 61* 64* 64*  CREATININE 5.19* 5.20* 5.29* 4.93*  CALCIUM 10.4*  --  10.2 10.0    Recent Labs Lab 06/29/16 1711  AST 26  ALT 18  ALKPHOS 56  BILITOT 1.0  PROT 7.2  ALBUMIN 3.8    Recent Labs Lab 06/29/16 1711 06/29/16 1712 06/30/16 0731 07/01/16 0346  WBC 5.7  --  6.2 7.0  NEUTROABS 4.1  --  4.5 5.9  HGB 10.0* 11.2* 10.0* 9.8*  HCT 32.0* 33.0* 33.5* 32.9*  MCV 98.2  --  98.8 99.4  PLT 217  --  212 187   Iron/TIBC/Ferritin/ %Sat    Component Value Date/Time   IRON 20* 05/12/2015 1438   TIBC 203* 05/12/2015 1438   FERRITIN 364* 05/12/2015 1438   IRONPCTSAT 10* 05/12/2015 1438   IRONPCTSAT 62* 09/03/2008 1100

## 2016-07-02 ENCOUNTER — Other Ambulatory Visit: Payer: Medicare Other

## 2016-07-02 DIAGNOSIS — N185 Chronic kidney disease, stage 5: Secondary | ICD-10-CM

## 2016-07-02 DIAGNOSIS — E785 Hyperlipidemia, unspecified: Secondary | ICD-10-CM | POA: Insufficient documentation

## 2016-07-02 DIAGNOSIS — I5042 Chronic combined systolic (congestive) and diastolic (congestive) heart failure: Secondary | ICD-10-CM

## 2016-07-02 DIAGNOSIS — I10 Essential (primary) hypertension: Secondary | ICD-10-CM

## 2016-07-02 DIAGNOSIS — I69391 Dysphagia following cerebral infarction: Secondary | ICD-10-CM

## 2016-07-02 DIAGNOSIS — N179 Acute kidney failure, unspecified: Secondary | ICD-10-CM

## 2016-07-02 DIAGNOSIS — I639 Cerebral infarction, unspecified: Secondary | ICD-10-CM | POA: Insufficient documentation

## 2016-07-02 DIAGNOSIS — I2510 Atherosclerotic heart disease of native coronary artery without angina pectoris: Secondary | ICD-10-CM

## 2016-07-02 DIAGNOSIS — I480 Paroxysmal atrial fibrillation: Secondary | ICD-10-CM | POA: Insufficient documentation

## 2016-07-02 DIAGNOSIS — J45909 Unspecified asthma, uncomplicated: Secondary | ICD-10-CM

## 2016-07-02 DIAGNOSIS — E1121 Type 2 diabetes mellitus with diabetic nephropathy: Secondary | ICD-10-CM

## 2016-07-02 DIAGNOSIS — Z9861 Coronary angioplasty status: Secondary | ICD-10-CM

## 2016-07-02 DIAGNOSIS — I5043 Acute on chronic combined systolic (congestive) and diastolic (congestive) heart failure: Secondary | ICD-10-CM

## 2016-07-02 LAB — GLUCOSE, CAPILLARY
GLUCOSE-CAPILLARY: 73 mg/dL (ref 65–99)
GLUCOSE-CAPILLARY: 124 mg/dL — AB (ref 65–99)
GLUCOSE-CAPILLARY: 177 mg/dL — AB (ref 65–99)
Glucose-Capillary: 110 mg/dL — ABNORMAL HIGH (ref 65–99)

## 2016-07-02 LAB — CBC WITH DIFFERENTIAL/PLATELET
Basophils Absolute: 0 10*3/uL (ref 0.0–0.1)
Basophils Relative: 0 %
EOS ABS: 0.2 10*3/uL (ref 0.0–0.7)
Eosinophils Relative: 2 %
HEMATOCRIT: 31.4 % — AB (ref 36.0–46.0)
HEMOGLOBIN: 9.5 g/dL — AB (ref 12.0–15.0)
LYMPHS ABS: 1.4 10*3/uL (ref 0.7–4.0)
LYMPHS PCT: 18 %
MCH: 30.2 pg (ref 26.0–34.0)
MCHC: 30.3 g/dL (ref 30.0–36.0)
MCV: 99.7 fL (ref 78.0–100.0)
MONOS PCT: 16 %
Monocytes Absolute: 1.2 10*3/uL — ABNORMAL HIGH (ref 0.1–1.0)
NEUTROS ABS: 4.8 10*3/uL (ref 1.7–7.7)
NEUTROS PCT: 64 %
Platelets: 186 10*3/uL (ref 150–400)
RBC: 3.15 MIL/uL — AB (ref 3.87–5.11)
RDW: 15.9 % — ABNORMAL HIGH (ref 11.5–15.5)
WBC: 7.5 10*3/uL (ref 4.0–10.5)

## 2016-07-02 LAB — BASIC METABOLIC PANEL
Anion gap: 20 — ABNORMAL HIGH (ref 5–15)
BUN: 64 mg/dL — AB (ref 6–20)
CHLORIDE: 93 mmol/L — AB (ref 101–111)
CO2: 30 mmol/L (ref 22–32)
Calcium: 11 mg/dL — ABNORMAL HIGH (ref 8.9–10.3)
Creatinine, Ser: 4.96 mg/dL — ABNORMAL HIGH (ref 0.44–1.00)
GFR calc Af Amer: 8 mL/min — ABNORMAL LOW (ref >60)
GFR calc non Af Amer: 7 mL/min — ABNORMAL LOW (ref >60)
Glucose, Bld: 75 mg/dL (ref 65–99)
POTASSIUM: 3.8 mmol/L (ref 3.5–5.1)
SODIUM: 143 mmol/L (ref 135–145)

## 2016-07-02 LAB — HEMOGLOBIN A1C
HEMOGLOBIN A1C: 5.4 % (ref 4.8–5.6)
Mean Plasma Glucose: 108 mg/dL

## 2016-07-02 MED ORDER — CARVEDILOL 6.25 MG PO TABS
6.2500 mg | ORAL_TABLET | Freq: Two times a day (BID) | ORAL | Status: DC
  Administered 2016-07-02 – 2016-07-03 (×3): 6.25 mg via ORAL
  Filled 2016-07-02 (×3): qty 1

## 2016-07-02 MED ORDER — BISACODYL 10 MG RE SUPP: 10.0000 mg | Freq: Every day | RECTAL | Status: DC | PRN

## 2016-07-02 MED ORDER — COUMADIN BOOK
Freq: Once | Status: AC
  Administered 2016-07-02: 17:00:00
  Filled 2016-07-02: qty 1

## 2016-07-02 MED ORDER — SENNA 8.6 MG PO TABS
2.0000 | ORAL_TABLET | Freq: Every day | ORAL | Status: DC
  Administered 2016-07-02 – 2016-07-04 (×2): 17.2 mg via ORAL
  Filled 2016-07-02 (×2): qty 2

## 2016-07-02 MED ORDER — WARFARIN SODIUM 4 MG PO TABS
4.0000 mg | ORAL_TABLET | Freq: Every day | ORAL | Status: DC
  Administered 2016-07-02 – 2016-07-03 (×2): 4 mg via ORAL
  Filled 2016-07-02 (×3): qty 1

## 2016-07-02 MED ORDER — POLYETHYLENE GLYCOL 3350 17 G PO PACK
17.0000 g | PACK | Freq: Every day | ORAL | Status: DC
  Administered 2016-07-02 – 2016-07-04 (×2): 17 g via ORAL
  Filled 2016-07-02 (×2): qty 1

## 2016-07-02 MED ORDER — ATORVASTATIN CALCIUM 80 MG PO TABS
80.0000 mg | ORAL_TABLET | Freq: Every morning | ORAL | Status: DC
Start: 2016-07-03 — End: 2016-07-04
  Administered 2016-07-03 – 2016-07-04 (×2): 80 mg via ORAL
  Filled 2016-07-02 (×2): qty 1

## 2016-07-02 MED ORDER — WARFARIN - PHARMACIST DOSING INPATIENT: Freq: Every day | Status: DC

## 2016-07-02 MED ORDER — WARFARIN VIDEO: Freq: Once | Status: DC

## 2016-07-02 NOTE — Progress Notes (Signed)
Physical Therapy Treatment Patient Details Name: Claretta FraiseCorine H Tagliaferri MRN: [id number] DOB: Dec 01, 1926 Today's Date: 07/02/2016    History of Present Illness Yaritzel Rushie ChestnutH Weill is a 82 y.o. female with medical history significant of hyperlipidemia, coronary artery stenosis, hypertension, CAD, chronic combined systolic and diastolic heart failure, PVD,obesity, intrinsic asthma, stage V chronic kidney disease was brought to the emergency department after she was found by her daughter on the front porch with right-sided facial droop, drooling and slurred speech and right-sided upper extremity paresis. The patient was placed on BiPAP ventilation due to dyspnea and hypoxia. CT was negative CXR showed mild CHF.     PT Comments    Patient able to ambulate 8 ft without Rt knee buckling, however as fatigued she required assist to advance RLE.   Follow Up Recommendations  CIR     Equipment Recommendations   (TBD next venue of care)    Recommendations for Other Services       Precautions / Restrictions Precautions Precautions: Fall Restrictions Weight Bearing Restrictions: No    Mobility  Bed Mobility                  Transfers Overall transfer level: Needs assistance Equipment used: 1 person hand held assist Transfers: Sit to/from Stand Sit to Stand: Min assist         General transfer comment: x 2 with min assist; Rt knee without buckling  Ambulation/Gait Ambulation/Gait assistance: Mod assist;Min assist;+2 safety/equipment Ambulation Distance (Feet): 7 Feet (seated rest, 3) Assistive device: 1 person hand held assist Gait Pattern/deviations: Step-through pattern;Decreased stride length;Decreased weight shift to right;Shuffle Gait velocity: very slow Gait velocity interpretation: <1.8 ft/sec, indicative of risk for recurrent falls General Gait Details: RUE supported for shoulder protection; when leads with RLE able to advance herself; if RLE falls behind LLE, she requires assist  to Lehman Brothersunweight and advance RLE   Stairs            Wheelchair Mobility    Modified Rankin (Stroke Patients Only) Modified Rankin (Stroke Patients Only) Pre-Morbid Rankin Score: Slight disability Modified Rankin: Moderately severe disability     Balance Overall balance assessment: Needs assistance Sitting-balance support: No upper extremity supported;Feet supported Sitting balance-Leahy Scale: Fair Sitting balance - Comments: Able to weight shift and maintain midline in sitting   Standing balance support: Single extremity supported Standing balance-Leahy Scale: Poor                      Cognition Arousal/Alertness: Awake/alert Behavior During Therapy: WFL for tasks assessed/performed Overall Cognitive Status: Impaired/Different from baseline Area of Impairment: Following commands;Problem solving;Orientation;Attention;Awareness Orientation Level: Situation;Time Current Attention Level: Selective   Following Commands: Follows one step commands with increased time;Follows multi-step commands inconsistently     Problem Solving: Slow processing;Decreased initiation;Requires verbal cues;Difficulty sequencing;Requires tactile cues General Comments: unable to state reason she was in hospital or her deficits    Exercises Other Exercises Other Exercises: RUE positioned on pillow to keep RUE in view at all times    General Comments        Pertinent Vitals/Pain Pain Assessment: No/denies pain    Home Living                      Prior Function            PT Goals (current goals can now be found in the care plan section) Acute Rehab PT Goals Patient Stated Goal: to be independent again Time  For Goal Achievement: 07/14/16 Progress towards PT goals: Progressing toward goals    Frequency  Min 4X/week    PT Plan Current plan remains appropriate    Co-evaluation             End of Session Equipment Utilized During Treatment: Gait belt;Oxygen  (and no oxygen) Activity Tolerance: Patient tolerated treatment well Patient left: in chair;with call bell/phone within reach;with family/visitor present     Time: [id number] PT Time Calculation (min) (ACUTE ONLY): 19 min  Charges:  $Gait Training: 8-22 mins                    G Codes:      Payslee Bateson 07/02/2016, 11:23 AM Pager [phone]

## 2016-07-02 NOTE — Progress Notes (Signed)
Patient transferred to 5M09. Patient is alert, oriented x3, disoriented to situation. Skin is intact. Patient, family oriented to unit, floor, staff. Vocalized understanding of call bell and phone. Will continue to monitor closely.

## 2016-07-02 NOTE — Progress Notes (Signed)
ANTICOAGULATION CONSULT NOTE - Initial Consult  Pharmacy Consult for Coumadin Indication: stroke  No Known Allergies  Patient Measurements: Height: 5\' 4"  (162.6 cm) Weight: 168 lb 14 oz (76.6 kg) IBW/kg (Calculated) : 54.7  Vital Signs: Temp: 98.7 F (37.1 C) (07/03 1157) Temp Source: Oral (07/03 1157) BP: 133/50 mmHg (07/03 1157) Pulse Rate: 66 (07/03 1157)  Labs:  Recent Labs  06/29/16 1711  06/30/16 0731 07/01/16 0346 07/02/16 0306  HGB 10.0*  < > 10.0* 9.8* 9.5*  HCT 32.0*  < > 33.5* 32.9* 31.4*  PLT 217  --  212 187 186  APTT 34  --   --   --   --   LABPROT 15.4*  --   --   --   --   INR 1.21  --   --   --   --   CREATININE 5.19*  < > 5.29* 4.93* 4.96*  < > = values in this interval not displayed.  Estimated Creatinine Clearance: 7.9 mL/min (by C-G formula based on Cr of 4.96).   Medical History: Past Medical History  Diagnosis Date  . Hyperlipidemia   . Carotid stenosis     Carotid US (12/2013): < 60% RCA, < 40% LICA.  [name]. Hypertension   . Coronary artery disease     a. s/p MI 1995 tx w/ PTCA to Dx, Ramus, ap LAD;  b. LHC (11/2004):  Inferior HK, EF 60%, proximal LAD 50%, ostial D1 80%, mid D1 95%, mid LAD 50-60%, distal LAD at the apex 90%, OM1 occluded (fills late by left to left collaterals), RCA 75%. Medical therapy recommended.;  c. Lexiscan Myoview (12/2013):  Prior ant-lat scar with very small area of peri-infarct ischemia in ant wall, EF 40 (high risk)   . Obesity (BMI 30-39.9)   . Intrinsic asthma 01/28/2008     Mild intermittant asthma, PFT's 01/28/08     . Old anterolateral myocardial infarction   . PVD (peripheral vascular disease) (HCC) 02/16/2014    2015, right femoral peroneal bypass grafting and left femoral-tibial bypass grafting by Dr. Arbie CookeyEarly for rest pain as well as nonhealing ulcer on toe accompanied with toe amputation  Arteriogram October 2015 with patent graft on the right, 70% stenosis of graft on the left proximally.   . Chronic combined  systolic and diastolic heart failure (HCC) 04/13/2014  . Complete heart block (HCC)     a. 05/2015 s/p [facility] [employer] DR model [id number] dual chamber PPM (serial number [id number]) (Dr. Johney FrameAllred).  [name]. Hx of echocardiogram     a. Echo 5/16: dist septal, dist ant, dist inf and apical AK, EF 40-45%, mod MR, mild LAE, PASP 72  . Presence of permanent cardiac pacemaker   . Chronic kidney disease (CKD), stage V (HCC)     notes 07/03/2015; Dr. Hyman HopesWebb  . 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 Type 2 diabetes mellitus with renal manifestations [facility](HCC)     Assessment: 82 year old female to begin Coumadin for Afib s/p stroke  Goal of Therapy:  INR 2-3 Monitor platelets by anticoagulation protocol: Yes   Plan:  Coumadin 4 mg po daily at 1800 pm for now Daily INR Coumadin education  Thank you Okey RegalLisa Cynethia Schindler, PharmD [phone]  07/02/2016,4:59 PM

## 2016-07-02 NOTE — Progress Notes (Signed)
Rehab admissions - Please see rehab consult today done by Dr. Allena KatzPatel.  I will see patient tomorrow.  Call me for questions.  #[phone]

## 2016-07-02 NOTE — Consult Note (Signed)
Physical Medicine and Rehabilitation Consult Reason for Consult: Aphasia with right-sided weakness Referring Physician: [facility]   HPI: Dorothy Bartlett is a 82 y.o. right handed female with history of hyperlipidemia, CAD with pacemaker, hypertension, chronic combined systolic and diastolic congestive heart failure, chronic renal insufficiency with creatinine 5.19. Per chart review patient lives alone. She used a walker prior to admission. She has a daughter that checks on her also. One level home with 2 steps to entry. Family did assist with meals due to some limited vision. Admitted 06/29/2016 with right sided weakness and slurred speech. Cranial CT scan negative for acute changes. MRI not completed due to pacemaker. Patient did not receive TPA. CTA of the neck showed plaque proximal right internal carotid artery with greater than 75% diameter stenosis. CTA of head prominent calcification cavernous segment internal carotid artery bilaterally with moderate to marked narrowing. Follow-up CT of the head 07/01/2016 again showing no intracranial acute abnormalities. Echocardiogram with ejection fraction of 65%. No wall motion abnormalities. Renal service has continued to follow for chronic renal insufficiency. Not felt to be a hemodialysis candidate and monitored conservatively. Currently on Plavix for CVA prophylaxis. Subcutaneous Lovenox for DVT prophylaxis. Patient with acute respiratory failure with hypoxia suspect related to congestive heart failure placed on BiPAP and monitored. Responded well to intravenous Lasix. Dysphagia #1 thin liquid diet. Physical therapy evaluation completed 06/30/2016 with recommendations of physical medicine rehabilitation consult.   Review of Systems  Constitutional: Negative for fever and chills.  HENT: Positive for hearing loss.   Eyes: Positive for blurred vision. Negative for double vision.  Respiratory: Negative for cough.        Occasional shortness of breath  with exertion.  Cardiovascular: Positive for leg swelling. Negative for chest pain and palpitations.  Gastrointestinal: Positive for constipation. Negative for nausea and vomiting.  Genitourinary: Negative for dysuria and hematuria.  Skin: Negative for rash.  Neurological: Positive for weakness. Negative for seizures and headaches.  All other systems reviewed and are negative.  Past Medical History  Diagnosis Date  . Hyperlipidemia   . Carotid stenosis     Carotid US (12/2013): < 60% RCA, < 40% LICA.  [name] Hypertension   . Coronary artery disease     a. s/p MI 1995 tx w/ PTCA to Dx, Ramus, ap LAD;  b. LHC (11/2004):  Inferior HK, EF 60%, proximal LAD 50%, ostial D1 80%, mid D1 95%, mid LAD 50-60%, distal LAD at the apex 90%, OM1 occluded (fills late by left to left collaterals), RCA 75%. Medical therapy recommended.;  c. Lexiscan Myoview (12/2013):  Prior ant-lat scar with very small area of peri-infarct ischemia in ant wall, EF 40 (high risk)   . Obesity (BMI 30-39.9)   . Intrinsic asthma 01/28/2008     Mild intermittant asthma, PFT's 01/28/08     . Old anterolateral myocardial infarction   . PVD (peripheral vascular disease) (HCC) 02/16/2014    2015, right femoral peroneal bypass grafting and left femoral-tibial bypass grafting by Dr. Arbie Cookey for rest pain as well as nonhealing ulcer on toe accompanied with toe amputation  Arteriogram October 2015 with patent graft on the right, 70% stenosis of graft on the left proximally.   . Chronic combined systolic and diastolic heart failure (HCC) 04/13/2014  . Complete heart block (HCC)     a. 05/2015 s/p [facility] Assurity DR model [id number] dual chamber PPM (serial number [id number]) (Dr. Johney Frame).  [name] Hx of echocardiogram  a. Echo 5/16: dist septal, dist ant, dist inf and apical AK, EF 40-45%, mod MR, mild LAE, PASP 72  . Presence of permanent cardiac pacemaker   . Chronic kidney disease (CKD), stage V (HCC)     notes 07/03/2015; Dr. Hyman Hopes  . Anemia due  to pre-end-stage renal disease treated with erythropoietin 05/08/2015  . Type 2 diabetes mellitus with renal manifestations [facility])    Past Surgical History  Procedure Laterality Date  . Repair of nerve in left arm  Left 2000  . Cardiac catheterization  1995    w/ PTCA Diag 2nd MI  . Femoral-tibial bypass graft Left 02/24/2014    Procedure: BYPASS GRAFT FEMORAL-TIBIAL ARTERY;  Surgeon: Larina Earthly, MD;  Location: [facility] OR;  Service: Vascular;  Laterality: Left;  . Femoral-popliteal bypass graft Right 08/04/2014    Procedure: RIGHT FEMORAL-PERONEAL ARTERY BYPASS GRAFT;  Surgeon: Larina Earthly, MD;  Location: [facility] OR;  Service: Vascular;  Laterality: Right;  . Amputation Right 08/04/2014    Procedure: AMPUTATION DIGIT-RIGHT 3RD TOE;  Surgeon: Larina Earthly, MD;  Location: [facility] OR;  Service: Vascular;  Laterality: Right;  . Lower extremity angiogram Left 01/08/2014    Procedure: LOWER EXTREMITY ANGIOGRAM;  Surgeon: Sherren Kerns, MD;  Location: [facility] CATH LAB;  Service: Cardiovascular;  Laterality: Left;  . Abdominal angiogram  01/08/2014    Procedure: ABDOMINAL ANGIOGRAM;  Surgeon: Sherren Kerns, MD;  Location: [facility] CATH LAB;  Service: Cardiovascular;;  . Lower extremity angiogram Right 08/03/2014    Procedure: LOWER EXTREMITY ANGIOGRAM;  Surgeon: Nada Libman, MD;  Location: [facility] CATH LAB;  Service: Cardiovascular;  Laterality: Right;  . Abdominal aortagram N/A 10/25/2014    Procedure: ABDOMINAL Ronny Flurry;  Surgeon: Chuck Hint, MD;  Location: [facility] CATH LAB;  Service: Cardiovascular;  Laterality: N/A;  . Ep implantable device N/A 05/10/2015    Procedure: Pacemaker Implant;  Surgeon: Hillis Range, MD;  Location: [facility] INVASIVE CV LAB;  Service: Cardiovascular;  Laterality: N/A;  . Ep implantable device N/A 06/13/2015    Procedure: Lead Revision/Repair;  Surgeon: Duke Salvia, MD;  Location: [facility] INVASIVE CV LAB;  Service: Cardiovascular;  Laterality: N/A;  . Pacemaker lead removal  06/13/2015    Procedure:  Pacemaker Lead Removal;  Surgeon: Duke Salvia, MD;  Location: [facility] INVASIVE CV LAB;  Service: Cardiovascular;;  . Insert / replace / remove pacemaker     Family History  Problem Relation Age of Onset  . Diabetes Father   . Hypertension Father   . Hypertension Daughter   . Hyperlipidemia Daughter   . Hyperlipidemia Son   . Hypertension Sister    Social History:  reports that she has never smoked. She has never used smokeless tobacco. She reports that she does not drink alcohol or use illicit drugs. Allergies: No Known Allergies Medications Prior to Admission  Medication Sig Dispense Refill  . albuterol (PROVENTIL HFA;VENTOLIN HFA) 108 (90 BASE) MCG/ACT inhaler Inhale 2 puffs into the lungs every 6 (six) hours as needed for wheezing or shortness of breath.    [name] aspirin 81 MG tablet Take 81 mg by mouth every morning.     [name] atorvastatin (LIPITOR) 40 MG tablet Take 40 mg by mouth every morning.     . B Complex-C-Folic Acid (RENA-VITE RX) 1 MG TABS Take 1 tablet by mouth every morning.     . carvedilol (COREG) 12.5 MG tablet Take 1 tablet (12.5 mg total) by mouth 2 (two) times daily. 180 tablet  1  . docusate sodium (COLACE) 100 MG capsule Take 100 mg by mouth 2 (two) times daily.     . furosemide (LASIX) 40 MG tablet Take 1 tablet (40 mg total) by mouth 2 (two) times daily. (Patient taking differently: Take 80 mg by mouth 2 (two) times daily. ) 180 tablet 3  . hydrALAZINE (APRESOLINE) 10 MG tablet Take 1 tablet (10 mg total) by mouth 3 (three) times daily. 270 tablet 3  . isosorbide mononitrate (IMDUR) 30 MG 24 hr tablet Take 30 mg by mouth daily.    [name]. JANUVIA 25 MG tablet Take 25 mg by mouth daily.    [name]. loratadine (CLARITIN) 10 MG tablet Take 10 mg by mouth daily as needed for allergies.    . Melatonin 3 MG TABS Take 3 mg by mouth at bedtime as needed (for sleep).     . nitroGLYCERIN (NITROSTAT) 0.4 MG SL tablet Place 1 tablet (0.4 mg total) under the tongue every 5 (five) minutes x 3 doses  as needed for chest pain. 25 tablet 3  . paricalcitol (ZEMPLAR) 1 MCG capsule Take 1 mcg by mouth daily.     . Vitamin D, Ergocalciferol, (DRISDOL) 50000 UNITS CAPS capsule Take 1 capsule (50,000 Units total) by mouth every 7 (seven) days. 10 capsule 0    Home: Home Living Family/patient expects to be discharged to:: Private residence Living Arrangements: Alone (Dtr visits daily, nephew stays at night sometimes) Available Help at Discharge: Family (potentially 24/7 assist) Type of Home: House Home Access: Stairs to enter Entergy CorporationEntrance Stairs-Number of Steps: 2 Entrance Stairs-Rails: None Home Layout: One level Bathroom Shower/Tub: Engineer, manufacturing systemsTub/shower unit Bathroom Toilet: Standard Home Equipment: Environmental consultantWalker - 2 wheels, Wheelchair - manual, Bedside commode, Shower seat, Tub bench  Lives With: Alone (grandson stays at night. Daughter cooks for her and checks o)  Functional History: Prior Function Level of Independence: Needs assistance Gait / Transfers Assistance Needed: Independent, furniture walks, walker for short distances outdoors, w/c long distances ADL's / Homemaking Assistance Needed: Able to independently bath/dress herself, family assisted with cooking only due to vision problems Communication / Swallowing Assistance Needed: normal PTA Functional Status:  Mobility: Bed Mobility Overal bed mobility: Needs Assistance Bed Mobility: Supine to Sit, Sit to Supine Supine to sit: Min assist Sit to supine: Min assist General bed mobility comments: Able to achieve sitting EOB with cues and heavy use of rail, but required min assist to scoot to EOB this date. Transfers Overall transfer level: Needs assistance Equipment used: 1 person hand held assist (chair) Transfers: Sit to/from Stand, Stand Pivot Transfers Sit to Stand: Min assist Stand pivot transfers: Mod assist General transfer comment: min A to side step to the L. No buckling of knee noted      ADL: ADL Overall ADL's : Needs  assistance/impaired Eating/Feeding: Set up, Supervision/ safety, Sitting Grooming: Minimal assistance, Sitting Upper Body Bathing: Minimal assitance, Sitting Lower Body Bathing: Moderate assistance, Sit to/from stand Upper Body Dressing : Moderate assistance Lower Body Dressing: Moderate assistance, Sit to/from stand Toilet Transfer: Minimal assistance, Stand-pivot Toileting- Clothing Manipulation and Hygiene: Moderate assistance Functional mobility during ADLs: Minimal assistance (sit stand and side step only) General ADL Comments: Pt motivated to become independent again  Cognition: Cognition Overall Cognitive Status: Impaired/Different from baseline Arousal/Alertness: Awake/alert Orientation Level: Oriented X4 Attention: Sustained Sustained Attention: Appears intact Memory: Impaired Memory Impairment: Storage deficit, Retrieval deficit, Decreased short term memory, Decreased recall of new information Decreased Short Term Memory: Verbal basic, Functional basic Awareness: Appears  intact Problem Solving: Appears intact Safety/Judgment: Appears intact Cognition Arousal/Alertness: Awake/alert Behavior During Therapy: Flat affect Overall Cognitive Status: Impaired/Different from baseline Area of Impairment: Problem solving, Attention Current Attention Level: Selective Following Commands: Follows one step commands consistently Problem Solving: Slow processing, Requires verbal cues  Blood pressure 141/57, pulse 65, temperature 98.6 F (37 C), temperature source Oral, resp. rate 17, height 5\' 4"  (1.626 m), weight 75.7 kg (166 lb 14.2 oz), SpO2 100 %. Physical Exam  Vitals reviewed. Constitutional: She is oriented to person, place, and time. She appears well-developed and well-nourished.  HENT:  Head: Normocephalic and atraumatic.  Eyes: Right eye exhibits no discharge. Left eye exhibits no discharge.  Pupils sluggish to light  Neck: Normal range of motion. Neck supple.    Cardiovascular: Normal rate and regular rhythm.   Respiratory: No respiratory distress.  Decrease inspiratory effort/ clear to auscultation  GI: Soft. Bowel sounds are normal. She exhibits no distension.  Musculoskeletal: She exhibits no edema or tenderness.  Neurological: She is alert and oriented to person, place, and time.  Patient with moderate dysarthria but intelligible.  Followed simple commands. Right facial droop Motor: RUE: 2+/5 shoulder abduction, 2/5 elbow flexion/extension, 0/5 hand grip RLE: hip flexion, knee extension 2+/5, ankle dorsi/plantar flexion 3/5 LLE: 4/5 proximal to distal LUE: 4+/5 proximal to distal DTRs symmetric  Skin: Skin is warm and dry.  Psychiatric: Her behavior is normal.  Mood flat    Results for orders placed or performed during the hospital encounter of 06/29/16 (from the past 24 hour(s))  Glucose, capillary     Status: Abnormal   Collection Time: 07/01/16  8:36 AM  Result Value Ref Range   Glucose-Capillary 103 (H) 65 - 99 mg/dL  Glucose, capillary     Status: Abnormal   Collection Time: 07/01/16 12:12 PM  Result Value Ref Range   Glucose-Capillary 121 (H) 65 - 99 mg/dL  Glucose, capillary     Status: Abnormal   Collection Time: 07/01/16  4:46 PM  Result Value Ref Range   Glucose-Capillary 133 (H) 65 - 99 mg/dL  Glucose, capillary     Status: Abnormal   Collection Time: 07/01/16  9:30 PM  Result Value Ref Range   Glucose-Capillary 127 (H) 65 - 99 mg/dL  CBC WITH DIFFERENTIAL     Status: Abnormal   Collection Time: 07/02/16  3:06 AM  Result Value Ref Range   WBC 7.5 4.0 - 10.5 K/uL   RBC 3.15 (L) 3.87 - 5.11 MIL/uL   Hemoglobin 9.5 (L) 12.0 - 15.0 g/dL   HCT 40.931.4 (L) 81.136.0 - 91.446.0 %   MCV 99.7 78.0 - 100.0 fL   MCH 30.2 26.0 - 34.0 pg   MCHC 30.3 30.0 - 36.0 g/dL   RDW 78.215.9 (H) 95.611.5 - 21.315.5 %   Platelets 186 150 - 400 K/uL   Neutrophils Relative % 64 %   Neutro Abs 4.8 1.7 - 7.7 K/uL   Lymphocytes Relative 18 %   Lymphs Abs 1.4  0.7 - 4.0 K/uL   Monocytes Relative 16 %   Monocytes Absolute 1.2 (H) 0.1 - 1.0 K/uL   Eosinophils Relative 2 %   Eosinophils Absolute 0.2 0.0 - 0.7 K/uL   Basophils Relative 0 %   Basophils Absolute 0.0 0.0 - 0.1 K/uL  Basic metabolic panel     Status: Abnormal   Collection Time: 07/02/16  3:06 AM  Result Value Ref Range   Sodium 143 135 - 145 mmol/L   Potassium 3.8  3.5 - 5.1 mmol/L   Chloride 93 (L) 101 - 111 mmol/L   CO2 30 22 - 32 mmol/L   Glucose, Bld 75 65 - 99 mg/dL   BUN 64 (H) 6 - 20 mg/dL   Creatinine, Ser 1.61 (H) 0.44 - 1.00 mg/dL   Calcium 09.6 (H) 8.9 - 10.3 mg/dL   GFR calc non Af Amer 7 (L) >60 mL/min   GFR calc Af Amer 8 (L) >60 mL/min   Anion gap 20 (H) 5 - 15   Ct Head Wo Contrast  07/01/2016  CLINICAL DATA:  82 year old hypertensive female with right-sided weakness and facial droop. Subsequent encounter. EXAM: CT HEAD WITHOUT CONTRAST TECHNIQUE: Contiguous axial images were obtained from the base of the skull through the vertex without intravenous contrast. COMPARISON:  06/29/2016 CT angiogram and head CT. FINDINGS: No intracranial hemorrhage. Prominent chronic microvascular changes without CT evidence of large acute infarct. Tiny calcification left frontal lobe unchanged of indeterminate etiology. Prominent peri vascular space versus remote infarct right basal ganglia. Moderate global atrophy without hydrocephalus. No intracranial mass lesion noted on this unenhanced exam. Vascular calcifications. Exophthalmos. Minimal mucosal thickening left maxillary sinus. IMPRESSION: No intracranial hemorrhage or CT evidence of large acute infarct. Prominent chronic microvascular changes. Atrophy. Vascular calcifications. Exophthalmos. Electronically Signed   By: Lacy Duverney M.D.   On: 07/01/2016 08:02    Assessment/Plan: Diagnosis: Likely left CVA Labs and images independently reviewed.  Records reviewed and summated above. Stroke: Continue secondary stroke prophylaxis and  Risk Factor Modification listed below:   Antiplatelet therapy:   Blood Pressure Management:  Continue current medication with prn's with permisive HTN per primary team Statin Agent:   Diabetes management:   Right sided hemiparesis: fit for orthosis to prevent contractures (resting hand splint for day, wrist cock up splint at night, PRAFO, etc  Motor recovery: Fluoxetine  1. Does the need for close, 24 hr/day medical supervision in concert with the patient's rehab needs make it unreasonable for this patient to be served in a less intensive setting? Yes  2. Co-Morbidities requiring supervision/potential complications: hyperlipidemia (cont meds), CAD with pacemaker (cont meds), HTN (monitor and provide prns in accordance with increased physical exertion and pain), chronic combined systolic and diastolic congestive heart failure (Monitor in accordance with increased physical activity and avoid UE resistance excercises), chronic renal insufficiency (avoid nephrotoxic meds), Dysphagia (cont SLP, advance diet as tolerated), DM (Monitor in accordance with exercise and adjust meds as necessary), Acute on chronic anemia (transfuse if necessary to ensure appropriate perfusion for increased activity tolerance) 3. Due to safety, disease management, medication administration and patient education, does the patient require 24 hr/day rehab nursing? Yes 4. Does the patient require coordinated care of a physician, rehab nurse, PT (1-2 hrs/day, 5 days/week), OT (1-2 hrs/day, 5 days/week) and SLP (1-2 hrs/day, 5 days/week) to address physical and functional deficits in the context of the above medical diagnosis(es)? Yes Addressing deficits in the following areas: balance, endurance, locomotion, strength, transferring, bathing, dressing, toileting, speech, swallowing and psychosocial support 5. Can the patient actively participate in an intensive therapy program of at least 3 hrs of therapy per day at least 5 days per week?  Yes 6. The potential for patient to make measurable gains while on inpatient rehab is excellent 7. Anticipated functional outcomes upon discharge from inpatient rehab are supervision and min assist  with PT, supervision and min assist with OT, modified independent and supervision with SLP. 8. Estimated rehab length of stay to reach the  above functional goals is: 20-24 days. 9. Does the patient have adequate social supports and living environment to accommodate these discharge functional goals? No 10. Anticipated D/C setting: Other 11. Anticipated post D/C treatments: SNF 12. Overall Rehab/Functional Prognosis: good  RECOMMENDATIONS: This patient's condition is appropriate for continued rehabilitative care in the following setting: Unfortunately, pt will not likely be able to reach a modified independent level of functioning after short IRF stay and she has limitted support from family memories due to family illnesses.  Recommend SNF at this time. Patient has agreed to participate in recommended program. Potentially Note that insurance prior authorization may be required for reimbursement for recommended care.  Comment: Rehab Admissions Coordinator to follow up.  Maryla Morrow, MD 07/02/2016

## 2016-07-02 NOTE — Progress Notes (Signed)
Patient is transferred to 38M 09 per wheelchair with daughter, Harriett Sineancy.

## 2016-07-02 NOTE — Progress Notes (Signed)
Occupational Therapy Treatment Patient Details Name: Claretta FraiseCorine H Pedigo MRN: [id number] DOB: [birth year] Today's Date: 07/02/2016    History of present illness Wilhemina H Franki MonteDeese is a 82 y.o. female with medical history significant of hyperlipidemia, coronary artery stenosis, hypertension, CAD, chronic combined systolic and diastolic heart failure, PVD,obesity, intrinsic asthma, stage V chronic kidney disease was brought to the emergency department after she was found by her daughter on the front porch with right-sided facial droop, drooling and slurred speech and right-sided upper extremity paresis. The patient was placed on BiPAP ventilation due to dyspnea and hypoxia. CT was negative CXR showed mild CHF.    OT comments  Patient progressing towards OT goals, continue plan of care for now. Please see ADL comment AND exercise comment for information regarding this OT treatment.    Follow Up Recommendations  CIR;Supervision/Assistance - 24 hour    Equipment Recommendations  Other (comment) (TBD next venue of care)    Recommendations for Other Services Rehab consult    Precautions / Restrictions Precautions Precautions: Fall Restrictions Weight Bearing Restrictions: No              ADL Overall ADL's : Needs assistance/impaired General ADL Comments: Pt found seated in recliner with daughter present in room. Educated pt on HEP for RUE and encouraged pt to perform these exercises daily, also educate pt's daughter on exercises. See exercises tab for more information. Took opportunity to talk with patient about importance of using RUE during functional tasks to increase overall functional use and strength of RUE. Discussed OT role during acute care stay.       Vision Additional Comments: baseline visual effects, will further assess          Cognition   Behavior During Therapy: WFL for tasks assessed/performed Overall Cognitive Status: Impaired/Different from baseline Area of Impairment:  Following commands;Problem solving;Orientation;Attention;Awareness Orientation Level: Situation;Time Current Attention Level: Selective    Following Commands: Follows one step commands with increased time;Follows multi-step commands inconsistently     Problem Solving: Slow processing;Decreased initiation;Requires verbal cues;Difficulty sequencing;Requires tactile cues General Comments: unable to state reason she was in hospital or her deficits      Exercises General Exercises - Upper Extremity Shoulder Flexion: AAROM;Right;10 reps;Seated (apart of HEP) Shoulder Extension: AAROM;Right;10 reps;Seated (apart of HEP) Shoulder ABduction: AAROM;Right;Seated;10 reps Shoulder ADduction: AAROM;Right;10 reps;Seated Elbow Flexion: AAROM;Right;10 reps;Seated (apart of HEP) Elbow Extension: AAROM;Right;10 reps;Seated (apart of HEP) Wrist Flexion: AAROM;Right;10 reps;Seated (apart of HEP) Wrist Extension: AAROM;Right;10 reps;Seated (apart of HEP) Digit Composite Flexion: AROM;Right;10 reps;Seated (apart of HEP) Composite Extension: AROM;Right;10 reps;Seated (apart of HEP)           Pertinent Vitals/ Pain       Pain Assessment: No/denies pain   Frequency Min 2X/week     Progress Toward Goals  OT Goals(current goals can now befound in the care plan section)  Progress towards OT goals: Progressing toward goals  Acute Rehab OT Goals Patient Stated Goal: to be independent again OT Goal Formulation: With patient/family Time For Goal Achievement: 07/15/16 Potential to Achieve Goals: Good  Plan Discharge plan remains appropriate    End of Session   Activity Tolerance Patient tolerated treatment well   Patient Left in chair;with call bell/phone within reach;with chair alarm set;with family/visitor present    Time: [id number] OT Time Calculation (min): 21 min  Charges: OT General Charges $OT Visit: 1 Procedure OT Treatments $Therapeutic Exercise: 8-22 mins  Edwin CapPatricia Oday Ridings , MS,  OTR/L, CLT Pager: [phone]  07/02/2016, 4:05 PM

## 2016-07-02 NOTE — Progress Notes (Signed)
PROGRESS NOTE  Dorothy Bartlett  ZOX:[id number] DOB: [birth year]  DOA: 06/29/2016 PCP: Eliott Nine, MD  Primary Nephrologist: Dr. Elvis Coil   Brief Narrative:  82 y.o. female with medical history significant of hyperlipidemia, hypertension, DM 2, multivessel CAD, chronic combined systolic and diastolic heart failure, permanent pacemaker for high degree AV block, PVD,obesity, intrinsic asthma, stage V chronic kidney disease was brought to the emergency department after she was found by her daughter on the front porch with right-sided facial droop, drooling and slurred speech and right-sided upper extremity paresis. Code stroke was activated. Imaging does not show acute stroke. Admitted to stepdown due to acute respiratory failure, on BiPAP. Neurology consulting for stroke workup. Nephrology consulted. Respiratory status improved-has been on oxygen via nasal cannula since 7/1 AM and has not required BiPAP. Transfer to medical bed 7/3. Should be stable for discharge in the next 1-2 days. Awaiting CIR consult.   Assessment & Plan:   Principal Problem:   Acute CVA (cerebrovascular accident) (HCC) Active Problems:   Hyperlipidemia   CAD S/P PTCA 1995   Intrinsic asthma   Chronic combined systolic and diastolic heart failure (HCC)   Type 2 diabetes mellitus with renal manifestations (HCC)   Essential hypertension   AKI (acute kidney injury) (HCC)   Acute ischemic stroke (HCC)   Acute respiratory failure with hypoxia (HCC)   Chronic kidney disease, stage V (HCC)   Suspected acute left CVA  - Residual dysarthria, facial asymmetry and right hemiparesis. - CTA head 06/29/16: Small vessel disease changes without CT evidence of large acute infarct. Unable to do MRI due to pacemaker. Hence repeated CT head 07/01/16: No intracranial hemorrhage or CT evidence of large acute infarct. - CTA head and neck: Moderate to marked narrowing bilateral ICA - 2-D echo 07/01/16: LVEF 60-65 percent and  diastolic dysfunction. - Hemoglobin A1c: 5.4. - LDL 83. - On aspirin 81 MG daily prior to admission, now on Plavix for secondary stroke prophylaxis. Aspirin not continued as per discussion with stroke M.D. - Stroke in follow-up appreciated. - PT, OT and ST evaluation. PT and ST recommend inpatient rehabilitation-ordered and input pending.  Acute respiratory failure with hypoxia - Precipitated by acute on chronic combined CHF, underlying severe pulmonary hypertension and asthma. - Admitted to stepdown and placed on BiPAP. Started IV Lasix and increased by nephrology. - 2-D echo 05/09/15: LVEF 40-45 percent, aortic sclerosis, moderate MR, PA peak pressures 72 mmHg. - -1310 mL since admission. - Treat underlying cause and titrate oxygen to maintain saturations >92%. BiPAP when necessary. Improved. Now on 2 L/m oxygen via nasal cannula and saturating in the 100s. Discussed with RN and weaning down oxygen.  Acute on chronic combined CHF - Continue IV Lasix and nitrates. Check repeat 2-D echo. Some of volume overload related to advanced chronic kidney disease but probably not HD candidate. - Held carvedilol secondary to acute CHF. Improving on IV Lasix.  Acute on stage V chronic kidney disease - Last creatinine prior to this admission on 07/05/15:3.39. Admitted with creatinine of 5.19 > 5.29. Either has acute kidney injury or progression of chronic kidney disease. Likely not candidate for HD - Did receive contrast for CTA head and neck on 6/30. - Nephrology consultation appreciated. Creatinine down to 4.96-stable over the last 24 hours.  Hyperlipidemia LDL 83. Lipitor increased to 80 mg daily from 40 MG daily.  CTA status post PTCA 1995 - Continue statins. Aspirin changed to Plavix.   Asthma - Bronchodilators. No clinical bronchospasm or features  suggestive of infectious exacerbation.   DM 2 with renal manifestations - Diabetic diet. SSI.  Essential hypertension - Continue hydralazine and  IV Lasix. Carvedilol had been held initially due to decompensated CHF which has since improved. Resume carvedilol.  Right ICA stenosis >75%.  Right lung abnormalities - Follow-up CT chest in 3 months. - This changes were seen on CTA neck. Bilateral pleural effusions greater on right. Lung parenchymal changes partially reflective of pulmonary vascular congestion however, groundglass opacities throughout the right lung apex including 1.4 cm rounded consolidation. This appearance is atypical for congestive heart and hence follow-up CT recommended by radiology.  S/P permanent pacemaker.  Dysphagia - Secondary to CVA. As per speech therapy, dysphagia 1 diet and thin liquids.  Anemia in chronic kidney disease - Stable.    DVT prophylaxis: Lovenox Code Status: Full Family Communication: Discussed with patient. Discussed with patient's daughter Ms. Landis GandyNancy McCoy and updated care and answered questions. Disposition Plan: DC location to be determined >CIR input pending. Admitted to stepdown unit. Transfer to medical bed 7/3   Consultants:   Neurology   Nephrology-signed off  CIR-pending  Procedures:   BIPAP   2-D echo 07/01/16: Study Conclusions  - Left ventricle: The cavity size was normal. Wall thickness was  normal. Systolic function was normal. The estimated ejection  fraction was in the range of 60% to 65%. Wall motion was normal;  there were no regional wall motion abnormalities. Doppler  parameters are consistent diastolic dysfunction. The E/e&' ratio  is >20, suggesting markedly elevated LV filling pressure. - Aortic valve: Trileaflet. Sclerotic leaflet tips without  stenosis. There was no regurgitation. - Mitral valve: Calcified annulus. Mildly thickened leaflets .  There was mild to moderate regurgitation. - Left atrium: Moderately dilated. - Right ventricle: The cavity size was normal. Wall thickness was  normal. The moderator band was prominent. - Right  atrium: The atrium was at the upper limits of normal in  size. - Tricuspid valve: There was moderate regurgitation. - Pulmonic valve: Poorly visualized. There was mild regurgitation. - Pulmonary arteries: Dilated. PA peak pressure: 62 mm Hg (S). - Inferior vena cava: The vessel was normal in size. The  respirophasic diameter changes were in the normal range (>= 50%),  consistent with normal central venous pressure.  Impressions:  - Compared to a prior echo in 2016, the LVEF is higher at 60-65%.  The estimated PA pressure is lower in this study, however, left  and right heart pressures are elevated.   Antimicrobials:   None    Subjective: States that her breathing is almost back to baseline. Still has some slurred speech but states it's improving. As per RN, no acute issues.  Objective:  Filed Vitals:   07/02/16 0500 07/02/16 0600 07/02/16 0700 07/02/16 0934  BP: 141/52 146/61 157/59 145/60  Pulse: 62 63 67   Temp:   98.5 F (36.9 C)   TempSrc:   Oral   Resp: 15 11 22    Height:      Weight:      SpO2: 100% 100% 100%     Intake/Output Summary (Last 24 hours) at 07/02/16 1014 Last data filed at 07/02/16 0600  Gross per 24 hour  Intake    960 ml  Output   1275 ml  Net   -315 ml   Filed Weights   06/30/16 0400 07/01/16 0330 07/02/16 0400  Weight: 78.1 kg (172 lb 2.9 oz) 76.7 kg (169 lb 1.5 oz) 75.7 kg (166 lb 14.2 oz)  Examination:  General exam: Pleasant elderly female, lying comfortably propped up in bed. Respiratory system: clear to auscultation without wheezing, rhonchi. Respiratory effort normal. Cardiovascular system: S1 & S2 heard, RRR. No JVD, murmurs, rubs, gallops or clicks. 1+ pedal edema. Telemetry: AV paced rhythm/SR with BBB.  Gastrointestinal system: Abdomen is nondistended, soft and nontender. No organomegaly or masses felt. Normal bowel sounds heard. Central nervous system: Alert and oriented. Dysarthria, facial asymmetry - these seem to  be better than 7/1. Extremities: grade 4+ x 5 power at least in left limbs. Right pronator drift. Right upper extremity 3 x 5 power in right lower extremity 2 x 5 power.  Skin: No rashes, lesions or ulcers Psychiatry: Judgement and insight appear normal. Mood & affect appropriate.     Data Reviewed: I have personally reviewed following labs and imaging studies  CBC:  Recent Labs Lab 06/29/16 1711 06/29/16 1712 06/30/16 0731 07/01/16 0346 07/02/16 0306  WBC 5.7  --  6.2 7.0 7.5  NEUTROABS 4.1  --  4.5 5.9 4.8  HGB 10.0* 11.2* 10.0* 9.8* 9.5*  HCT 32.0* 33.0* 33.5* 32.9* 31.4*  MCV 98.2  --  98.8 99.4 99.7  PLT 217  --  212 187 186   Basic Metabolic Panel:  Recent Labs Lab 06/29/16 1711 06/29/16 1712 06/30/16 0731 07/01/16 0346 07/02/16 0306  NA 137 139 142 140 143  K 4.2 4.2 4.0 3.8 3.8  CL 99* 99* 100* 101 93*  CO2 26  --  29 29 30   GLUCOSE 170* 165* 80 73 75  BUN 65* 61* 64* 64* 64*  CREATININE 5.19* 5.20* 5.29* 4.93* 4.96*  CALCIUM 10.4*  --  10.2 10.0 11.0*  MG  --   --  2.6*  --   --    GFR: Estimated Creatinine Clearance: 7.8 mL/min (by C-G formula based on Cr of 4.96). Liver Function Tests:  Recent Labs Lab 06/29/16 1711  AST 26  ALT 18  ALKPHOS 56  BILITOT 1.0  PROT 7.2  ALBUMIN 3.8   No results for input(s): LIPASE, AMYLASE in the last 168 hours. No results for input(s): AMMONIA in the last 168 hours. Coagulation Profile:  Recent Labs Lab 06/29/16 1711  INR 1.21   Cardiac Enzymes: No results for input(s): CKTOTAL, CKMB, CKMBINDEX, TROPONINI in the last 168 hours. BNP (last 3 results) No results for input(s): PROBNP in the last 8760 hours. HbA1C:  Recent Labs  06/30/16 0344  HGBA1C 5.4   CBG:  Recent Labs Lab 07/01/16 0836 07/01/16 1212 07/01/16 1646 07/01/16 2130 07/02/16 0812  GLUCAP 103* 121* 133* 127* 73   Lipid Profile:  Recent Labs  06/30/16 0731  CHOL 156  HDL 49  LDLCALC 83  TRIG 120  CHOLHDL 3.2    Thyroid Function Tests: No results for input(s): TSH, T4TOTAL, FREET4, T3FREE, THYROIDAB in the last 72 hours. Anemia Panel: No results for input(s): VITAMINB12, FOLATE, FERRITIN, TIBC, IRON, RETICCTPCT in the last 72 hours.  Sepsis Labs: No results for input(s): PROCALCITON, LATICACIDVEN in the last 168 hours.  No results found for this or any previous visit (from the past 240 hour(s)).       Radiology Studies: Ct Head Wo Contrast  07/01/2016  CLINICAL DATA:  82 year old hypertensive female with right-sided weakness and facial droop. Subsequent encounter. EXAM: CT HEAD WITHOUT CONTRAST TECHNIQUE: Contiguous axial images were obtained from the base of the skull through the vertex without intravenous contrast. COMPARISON:  06/29/2016 CT angiogram and head CT. FINDINGS: No intracranial  hemorrhage. Prominent chronic microvascular changes without CT evidence of large acute infarct. Tiny calcification left frontal lobe unchanged of indeterminate etiology. Prominent peri vascular space versus remote infarct right basal ganglia. Moderate global atrophy without hydrocephalus. No intracranial mass lesion noted on this unenhanced exam. Vascular calcifications. Exophthalmos. Minimal mucosal thickening left maxillary sinus. IMPRESSION: No intracranial hemorrhage or CT evidence of large acute infarct. Prominent chronic microvascular changes. Atrophy. Vascular calcifications. Exophthalmos. Electronically Signed   By: Lacy Duverney M.D.   On: 07/01/2016 08:02        Scheduled Meds: . atorvastatin  40 mg Oral q morning - 10a  . clopidogrel  75 mg Oral Daily  . enoxaparin (LOVENOX) injection  30 mg Subcutaneous Q24H  . furosemide  60 mg Intravenous BID  . hydrALAZINE  10 mg Oral TID  . insulin aspart  0-9 Units Subcutaneous TID WC  . isosorbide mononitrate  30 mg Oral Daily  . paricalcitol  1 mcg Oral Daily   Continuous Infusions:    LOS: 3 days    Time spent: 45  minutes.    [facility], MD [facility] Pager [phone]  If 7PM-7AM, please contact night-coverage [email or web address] Password [email or web address] 07/02/2016, 10:14 AM

## 2016-07-02 NOTE — Progress Notes (Signed)
Addendum  Discussed with Dr. Marvel PlanJindong Xu: Per device check 03/19/16 A. fib noted but anticoagulation was not started due to advanced age. A. fib again noted on device 06/19/16. Anticoagulation on recommended due to history of A. fib and new stroke which may be embolic. No clear contraindications-no history of falls or GI bleed. Discussed with patient's primary cardiologist who is okay with starting Coumadin anticoagulation if patient is agreeable. Discussed extensively with patient and family and they are willing to start Coumadin-per pharmacy. As per neurology, continue aspirin until INR therapeutic >2. No need for heparin bridging.  Marcellus ScottHONGALGI,Dorothy Brummitt, MD, FACP, FHM. [facility] Pager [phone]  If 7PM-7AM, please contact night-coverage [email or web address] Password [email or web address] 07/02/2016, 4:59 PM

## 2016-07-02 NOTE — Progress Notes (Signed)
STROKE TEAM PROGRESS NOTE   SUBJECTIVE (INTERVAL HISTORY) Her daughter is at the bedside. Daughter stated that pt at home prior to stroke pretty independent, lives alone, do ADLs independently, no fall or GIB. She cooks, cleans and fixes patient's pill box. PT has just worked with the patient, who is now up in the chair. She became tired at the end of their session.   She has pacemaker, has been found to have afib on pacemaker interrogation since 03/2015. Not on AC due to advanced age and also pt has refused dialysis for CKD and does not want aggressive treatment.   OBJECTIVE Temp:  [98 F (36.7 C)-98.6 F (37 C)] 98.5 F (36.9 C) (07/03 0700) Pulse Rate:  [44-73] 67 (07/03 0700) Cardiac Rhythm:  [-] Normal sinus rhythm;Bundle branch block (07/03 0701) Resp:  [10-22] 22 (07/03 0700) BP: (119-157)/(42-75) 145/60 mmHg (07/03 0934) SpO2:  [82 %-100 %] 100 % (07/03 0700) Weight:  [75.7 kg (166 lb 14.2 oz)] 75.7 kg (166 lb 14.2 oz) (07/03 0400)  CBC:   Recent Labs Lab 07/01/16 0346 07/02/16 0306  WBC 7.0 7.5  NEUTROABS 5.9 4.8  HGB 9.8* 9.5*  HCT 32.9* 31.4*  MCV 99.4 99.7  PLT 187 186    Basic Metabolic Panel:   Recent Labs Lab 06/30/16 0731 07/01/16 0346 07/02/16 0306  NA 142 140 143  K 4.0 3.8 3.8  CL 100* 101 93*  CO2 29 29 30   GLUCOSE 80 73 75  BUN 64* 64* 64*  CREATININE 5.29* 4.93* 4.96*  CALCIUM 10.2 10.0 11.0*  MG 2.6*  --   --     Lipid Panel:     Component Value Date/Time   CHOL 156 06/30/2016 0731   TRIG 120 06/30/2016 0731   HDL 49 06/30/2016 0731   CHOLHDL 3.2 06/30/2016 0731   VLDL 24 06/30/2016 0731   LDLCALC 83 06/30/2016 0731   HgbA1c:  Lab Results  Component Value Date   HGBA1C 5.4 06/30/2016    IMAGING I have personally reviewed the radiological images below and agree with the radiology interpretations.  Ct Head Code Stroke W/o Cm 06/29/2016   1. No intracranial hemorrhage or definite acute infarct.  2. Chronic microvascular  disease and mild for age atrophy.   CT head 06/29/2016   Small vessel disease changes without CT evidence of large acute infarct. No intracranial mass or abnormal enhancement. Global mild atrophy without hydrocephalus.   CTA NECK  06/29/2016   Aortic atherosclerosis. Plaque proximal right internal carotid artery with greater than 75% diameter stenosis. Ectatic left carotid artery with bifurcation posterior to the pharynx. No hemodynamically significant stenosis of the left internal carotid artery. Mild narrowing origin of vertebral arteries bilaterally. Left vertebral artery dominant in size. Multilevel cervical spondylotic changes with various degrees of spinal stenosis and foraminal narrowing. Limbus vertebra C7/protrusion with marked canal narrowing. Bilateral pleural effusions greater on right. Lung parenchymal changes partially reflective of pulmonary vascular congestion however, ground-glass opacities throughout the right lung apex including 1.4 cm rounded consolidation. This appearance is atypical for congestive heart. Recommend follow-up chest CT in 3 months after treatment of any congestive heart failure or infectious infiltrate.   CTA HEAD  06/29/2016   Prominent calcification cavernous segment internal carotid artery bilaterally with moderate to marked narrowing. Mild narrowing left middle cerebral artery M1 segment. Moderate narrowing right middle cerebral artery M1 segment. Moderate to marked narrowing A1 segment right anterior cerebral artery. Left vertebral artery is dominant. Moderate to marked narrowing vertebral  arteries at the level of the foramen magnum. Mild narrowing and irregularity basilar artery. Moderate narrowing proximal mid and distal aspect posterior cerebral artery bilaterally.   2D Echocardiogram  - Left ventricle: The cavity size was normal. Wall thickness was normal. Systolic function was normal. The estimated ejection fraction was in the range of 60% to 65%. Wall motion  was normal; there were no regional wall motion abnormalities. Doppler parameters are consistent diastolic dysfunction. The E/e&' ratio is >20, suggesting markedly elevated LV filling pressure. - Aortic valve: Trileaflet. Sclerotic leaflet tips without stenosis. There was no regurgitation. - Mitral valve: Calcified annulus. Mildly thickened leaflets . There was mild to moderate regurgitation. - Left atrium: Moderately dilated. - Right ventricle: The cavity size was normal. Wall thickness was normal. The moderator band was prominent. - Right atrium: The atrium was at the upper limits of normal in size. - Tricuspid valve: There was moderate regurgitation. - Pulmonic valve: Poorly visualized. There was mild regurgitation. - Pulmonary arteries: Dilated. PA peak pressure: 62 mm Hg (S). - Inferior vena cava: The vessel was normal in size. The respirophasic diameter changes were in the normal range (>= 50%), consistent with normal central venous pressure. Impressions:   Compared to a prior echo in 2016, the LVEF is higher at 60-65%. The estimated PA pressure is lower in this study, however, left and right heart pressures are elevated.   PHYSICAL EXAM  Temp:  [98 F (36.7 C)-98.7 F (37.1 C)] 98.7 F (37.1 C) (07/03 1157) Pulse Rate:  [44-73] 66 (07/03 1157) Resp:  [10-22] 20 (07/03 1157) BP: (119-161)/(42-75) 133/50 mmHg (07/03 1157) SpO2:  [82 %-100 %] 94 % (07/03 1157) Weight:  [166 lb 14.2 oz (75.7 kg)-168 lb 14 oz (76.6 kg)] 168 lb 14 oz (76.6 kg) (07/03 1157)  General - Well nourished, well developed, in no apparent distress.  Ophthalmologic - Fundi not visualized due to noncooperation.  Cardiovascular - Regular rate and rhythm.  Mental Status -  Level of arousal and orientation to time, place, and person were intact. Language including expression, naming, repetition, comprehension was assessed and found intact, but moderate dysarthria. Fund of Knowledge was assessed and was  intact.  Cranial Nerves II - XII - II - Visual field intact OU. III, IV, VI - left gaze preference but able to move to right. V - Facial sensation intact bilaterally. VII - right facial droop. VIII - Hearing & vestibular intact bilaterally. X - Palate elevates symmetrically. XI - Chin turning & shoulder shrug intact bilaterally. XII - Tongue protrusion intact.  Motor Strength - The patient's strength was RUE 3/5 and RLE proximal 3-/5 but distal 4/5, LUE and LLE 5/5.  Bulk was normal and fasciculations were absent.   Motor Tone - Muscle tone was assessed at the neck and appendages and was normal.  Reflexes - The patient's reflexes were 1+ in all extremities and she had no pathological reflexes.  Sensory - Light touch, temperature/pinprick were assessed and were symmetrical.    Coordination - The patient had normal movements in the left hand with no ataxia or dysmetria.  Tremor was absent.  Gait and Station - deferred to PT in room   ASSESSMENT/PLAN Ms. Dorothy Bartlett is a 82 y.o. female with history of hyperlipidemia, carotid stenosis, hypertension, CAD with previous MI, obesity, CHF, heart block s/p permanent pacemaker, DM, and CKD presenting with dysarthria and right hemiparesis. She did not receive IV t-PA due to late presentation.  Stroke:  L brain infarct likely  due to afib vs. Severe intracerebral atherosclerosis  Resultant- dysarthria and right hemiparesis.  CTA head severe intracranial atherosclerosis  CTA of the neck - R ICA 75% stenosis  Repeat CT head - no obvious infarct  2D Echo EF 60-60%, no SOE.   LDL - 83  HgbA1c 5.4  VTE prophylaxis - Lovenox DIET - DYS 1 Room service appropriate?: Yes; Fluid consistency:: Thin  aspirin 81 mg daily prior to admission, now on clopidogrel 75 mg daily. Due to concerns of afib as possible etiology of stroke, anticoagulation is recommended. Pt has no contraindication for coumadin, and family/pt agrees with starting coumadin.  OK with plavix bridge.   Patient counseled to be compliant with her antithrombotic medications  Ongoing aggressive stroke risk factor management  Therapy recommendations: CIR. Consult in place.  Disposition: pending  (family supportive)  Atrial Fibrillation  Per device check 03/19/2016 AF notes, hold OAC given advanced age  AF agagin noted on device check 06/19/2016  Home anticoagulation:  none    Consider coumadin for secondary stroke prevention. OK with plavix bridge  Bilateral intracranial ICA stenosis  CTA confirmed  Diffuse atherosclerosis  Currently on coumadin with plavix bridge  Right ICA extracranial stenosis  ICA proximal > 75% on CTA  Asymptomatic at this time  Currently on coumadin with plavix bridge   Hypertension  Stable  Permissive hypertension (OK if < 220/120) but gradually normalize in 5-7 days  Long-term BP goal 130-150 due to ICA stenosis  Hyperlipidemia  Home meds:  Lipitor 40 mg daily resumed in hospital  LDL 83, goal < 70  Increase Lipitor to 80 mg daily  Continue statin at discharge  Diabetes  HgbA1c 5.4, goal < 7.0  controlled  Other Stroke Risk Factors  Advanced age  Obesity, Body mass index is 28.63 kg/(m^2)., recommend weight loss, diet and exercise as appropriate   Coronary artery disease  Hx systolic CHF. Acute on chronic combined CHF  Other Active Problems  Acute on Chronic stage V kidney disease - creatinine 5.29 (not on dialysis, not a good candidate given comorbidities)  Right lung abnormalities - follow-up chest CT recommended in 3 months  Anemia in CKD  Asthma   Hospital day # 3  Neurology will sign off. Please call with questions. Pt will follow up with Dr. Roda Shutters at [facility] in about 2 months. Thanks for the consult.  Marvel Plan, MD PhD Stroke Neurology 07/02/2016 5:31 PM    To contact Stroke Continuity provider, please refer to [email or web address]. After hours, contact General Neurology

## 2016-07-02 NOTE — Care Management Important Message (Signed)
Important Message  Patient Details  Name: Claretta FraiseCorine H Gadd MRN: [id number] Date of Birth: May 14, 1926   Medicare Important Message Given:  Yes    Jasmynn Pfalzgraf Abena 07/02/2016, 10:24 AM

## 2016-07-02 NOTE — Progress Notes (Signed)
Speech Language Pathology Treatment: Dysphagia (Dysarthria Therapy)  Patient Details Name: Dorothy Bartlett MRN: [id number] DOB: 17-Feb-1926 Today's Date: 07/02/2016 Time: [id number] SLP Time Calculation (min) (ACUTE ONLY): 19 min  Assessment / Plan / Recommendation Clinical Impression  ST follow up for therapeutic diet tolerance and therapy for dysarthria.  Chart review indicated that the patient has been afebrile, lungs are clear and intake has been good.  Nursing reported no obvious issues.  The patient was eating her breakfast tray when ST entered the room sitting upright in her bedside chair.  The patient reported no issues swallowing.  When queried she reported issues with anterior loss but stated that she usually sensed it.  She further reported no issues with pocketing in the right cheek and stated that she performed a lingual sweep periodically to check for pocketing.  Meal observation was completed using her dysphagia 1 breakfast tray.  Mild anterior loss was noted given dysphagia 1 material with good sensation by the patient.  Anterior loss was not seen given straw sips of thin.  Overt s/s of aspiration were not consistently seen.  The patient was noted to have a cough x 2 given hot thin liquids via straw.  The patient was also noted to have dysarthria with decreased intelligibility.  Strategies to improve intelligibility were discussed (i.e. Deep breath prior to speaking and increased volume).  The patient was able to demonstrate use of those strategies immediately following review but was not noted to carry them over.  The patient will benefit from continued follow up at this and next level of care.     HPI HPI: Dorothy Bartlett is a 82 y.o. female with medical history significant of hyperlipidemia, coronary artery stenosis, hypertension, CAD, chronic combined systolic and diastolic heart failure, PVD,obesity, intrinsic asthma, stage V chronic kidney disease was brought to the emergency department  after she was found by her daughter on the front porch with right-sided facial droop, drooling and slurred speech and right-sided upper extremity paresis.  The patient was placed on BiPAP ventilation due to dyspnea and hypoxia. CT was negative CXR showed mild CHF. MD asked SLP verbally to evaluate pt.  No notes from SLP in chart.       SLP Plan  Continue with current plan of care     Recommendations  Diet recommendations: Dysphagia 1 (puree);Thin liquid Liquids provided via: Straw;Cup Medication Administration: Whole meds with puree Supervision: Patient able to self feed Compensations: Minimize environmental distractions;Slow rate;Small sips/bites;Monitor for anterior loss Postural Changes and/or Swallow Maneuvers: Seated upright 90 degrees             Oral Care Recommendations: Oral care BID Follow up Recommendations: Inpatient Rehab Plan: Continue with current plan of care     GO              Dimas AguasMelissa Ramel Tobon, MA, CCC-SLP Acute Rehab SLP [phone] Fleet ContrasGoodman, Aliyyah Riese N 07/02/2016, 9:22 AM

## 2016-07-03 ENCOUNTER — Encounter (HOSPITAL_COMMUNITY): Payer: Self-pay | Admitting: General Practice

## 2016-07-03 LAB — GLUCOSE, CAPILLARY
GLUCOSE-CAPILLARY: 192 mg/dL — AB (ref 65–99)
Glucose-Capillary: 83 mg/dL (ref 65–99)
Glucose-Capillary: 235 mg/dL — ABNORMAL HIGH (ref 65–99)
Glucose-Capillary: 214 mg/dL — ABNORMAL HIGH (ref 65–99)

## 2016-07-03 LAB — BASIC METABOLIC PANEL
Anion gap: 13 (ref 5–15)
BUN: 65 mg/dL — ABNORMAL HIGH (ref 6–20)
CO2: 29 mmol/L (ref 22–32)
Calcium: 10.2 mg/dL (ref 8.9–10.3)
Chloride: 96 mmol/L — ABNORMAL LOW (ref 101–111)
Creatinine, Ser: 4.97 mg/dL — ABNORMAL HIGH (ref 0.44–1.00)
GFR calc Af Amer: 8 mL/min — ABNORMAL LOW (ref >60)
GFR calc non Af Amer: 7 mL/min — ABNORMAL LOW (ref >60)
Glucose, Bld: 73 mg/dL (ref 65–99)
Potassium: 3.6 mmol/L (ref 3.5–5.1)
Sodium: 138 mmol/L (ref 135–145)

## 2016-07-03 LAB — PROTIME-INR
INR: 1.18 (ref 0.00–1.49)
Prothrombin Time: 15.2 seconds (ref 11.6–15.2)

## 2016-07-03 MED ORDER — CARVEDILOL 12.5 MG PO TABS
12.5000 mg | ORAL_TABLET | Freq: Two times a day (BID) | ORAL | Status: DC
  Administered 2016-07-03 – 2016-07-04 (×2): 12.5 mg via ORAL
  Filled 2016-07-03 (×2): qty 1

## 2016-07-03 MED ORDER — MELATONIN 3 MG PO TABS
3.0000 mg | ORAL_TABLET | Freq: Every evening | ORAL | Status: DC | PRN
  Administered 2016-07-03: 3 mg via ORAL
  Filled 2016-07-03 (×2): qty 1

## 2016-07-03 MED ORDER — FUROSEMIDE 80 MG PO TABS
80.0000 mg | ORAL_TABLET | Freq: Two times a day (BID) | ORAL | Status: DC
  Administered 2016-07-03 – 2016-07-04 (×2): 80 mg via ORAL
  Filled 2016-07-03 (×2): qty 1

## 2016-07-03 NOTE — Progress Notes (Signed)
Speech Language Pathology Treatment: Dysphagia, motor speech Patient Details Name: Dorothy Bartlett MRN: [id number] DOB: 08-30-1926 Today's Date: 07/03/2016 Time: [id number] SLP Time Calculation (min) (ACUTE ONLY): 20 min  Assessment / Plan / Recommendation Clinical Impression  Dysphagia treatment provided for diet tolerance check and advanced PO trials. Pt with dentures today and consumed dysphagia 2 consistency without overt s/s of aspiration. Pt does have prolonged mastication and swallows 2-3 times per bolus. Pt continues to have anterior loss with thin liquids by cup; this was significantly reduced by taking sips by straw. Pt and god-daughter at bedside report that pt has been consuming dysphagia 1 diet well without overt difficulty. Given these findings, recommend advancing diet to dysphagia 2 consistency, thin liquids (use of straw is easier), meds whole in puree, full supervision to assist with feeding, cue pt to take small bites/ sips, monitor anterior loss of bolus. Will continue to follow for diet tolerance/ review compensatory strategies.   Speech treatment provided to review speech intelligibility strategies. Pt recalled 1 strategy (speaking slower), reviewed opening mouth wider and taking deep breaths before initiating a sentence. Pt able to demonstrate these strategies in short phrases but unable to do so independently in conversation. Voice quality appears to be the greatest hindrance to speech intelligibility at this time; speech in conversation judged to be 75% intelligible. Will continue to follow for motor speech and cognitive treatment to assist in memory strategies for recall of therapy recommendations.    HPI HPI: Dorothy Bartlett is a 82 y.o. female with medical history significant of hyperlipidemia, coronary artery stenosis, hypertension, CAD, chronic combined systolic and diastolic heart failure, PVD,obesity, intrinsic asthma, stage V chronic kidney disease was brought to the  emergency department after she was found by her daughter on the front porch with right-sided facial droop, drooling and slurred speech and right-sided upper extremity paresis.  The patient was placed on BiPAP ventilation due to dyspnea and hypoxia. CT was negative CXR showed mild CHF. MD asked SLP verbally to evaluate pt.  No notes from SLP in chart.       SLP Plan  Continue with current plan of care     Recommendations  Diet recommendations: Dysphagia 2 (fine chop);Thin liquid Liquids provided via: Straw Medication Administration: Whole meds with puree Supervision: Patient able to self feed;Intermittent supervision to cue for compensatory strategies Compensations: Minimize environmental distractions;Slow rate;Small sips/bites;Monitor for anterior loss Postural Changes and/or Swallow Maneuvers: Seated upright 90 degrees             Oral Care Recommendations: Oral care BID Follow up Recommendations: Inpatient Rehab;Skilled Nursing facility Plan: Continue with current plan of care     GO                Metro KungOleksiak, Ridgely Anastacio K, MA, CCC-SLP 07/03/2016, 11:29 AM [phone]

## 2016-07-03 NOTE — [place] FL2 (Signed)
[place] MEDICAID FL2 LEVEL OF CARE SCREENING TOOL     IDENTIFICATION  Patient Name: Dorothy Bartlett Birthdate: May 02, 1926 Sex: female Admission Date (Current Location): 06/29/2016  [facility] [place]   Facility and Address:  [facility], [address], [place], [place] [address]      Provider Number: [id number]  Attending Physician Name and Address:  Elease EtienneAnand D Hongalgi, MD  Relative Name and Phone Number:       Current Level of Care: Hospital Recommended Level of Care: Skilled Nursing Facility Prior Approval Number:    Date Approved/Denied:   PASRR Number:   [id number]   Discharge Plan: SNF    Current Diagnoses: Patient Active Problem List   Diagnosis Date Noted  . Dysphagia, post-stroke   . Stroke (HCC)   . HLD (hyperlipidemia)   . Paroxysmal atrial fibrillation (HCC)   . Acute ischemic stroke (HCC)   . Acute respiratory failure with hypoxia (HCC)   . Chronic kidney disease, stage V (HCC)   . Acute CVA (cerebrovascular accident) (HCC) 06/29/2016  . AKI (acute kidney injury) (HCC) 06/29/2016  . Acute on chronic combined systolic and diastolic heart failure (HCC)   . Essential hypertension   . Vitamin D deficiency 06/14/2015  . Secondary hyperparathyroidism (HCC) 06/14/2015  . Dyspnea   . Pacemaker lead malfunction 06/11/2015  . Cardiomyopathy-EF 40-45% by echo May 2016 06/11/2015  . Palliative care encounter   . Onychomycosis 06/03/2015  . Pain in lower limb 06/03/2015  . Pulmonary HTN (HCC) 05/15/2015  . Pacemaker-St Jude May 2016   . Elevated troponin 05/09/2015  . Pre-syncope 05/09/2015  . 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 Complete heart block (HCC) 05/08/2015  . Obesity (BMI 30-39.9)   . Old anterolateral myocardial infarction   . Type 2 diabetes mellitus with renal manifestations (HCC)   . Chronic combined systolic and diastolic heart failure (HCC)  52/77/824204/14/2015  . PVD (peripheral vascular disease) (HCC) 02/16/2014  . CKD stage 5, GFR less than 15 ml/min 01/15/2014  . Intrinsic asthma 01/28/2008  . Hypertensive heart disease   . Hyperlipidemia   . CAD S/P PTCA 1995     Orientation RESPIRATION BLADDER Height & Weight     Self, Time, Situation, Place  Normal Continent Weight: 168 lb 14 oz (76.6 kg) Height:  5\' 4"  (162.6 cm)  BEHAVIORAL SYMPTOMS/MOOD NEUROLOGICAL BOWEL NUTRITION STATUS      Continent Diet (Diet recommendations: Dysphagia 1 (puree);Thin liquid)  AMBULATORY STATUS COMMUNICATION OF NEEDS Skin   Extensive Assist Verbally Normal                       Personal Care Assistance Level of Assistance  Bathing, Feeding, Dressing Bathing Assistance: Limited assistance Feeding assistance: Independent Dressing Assistance: Limited assistance     Functional Limitations Info  Sight, Hearing, Speech Sight Info: Impaired Hearing Info: Impaired Speech Info: Adequate    SPECIAL CARE FACTORS FREQUENCY  PT (By licensed PT), OT (By licensed OT)     PT Frequency: 5 OT Frequency: 5            Contractures Contractures Info: Not present    Additional Factors Info  Code Status, Allergies, Insulin Sliding Scale Code Status Info: Full Code Allergies Info: NKA   Insulin Sliding Scale Info: 0-9 units 3x day       Current Medications (07/03/2016):  This is the current hospital active medication list Current  Facility-Administered Medications  Medication Dose Route Frequency Provider Last Rate Last Dose  . albuterol (PROVENTIL) (2.5 MG/3ML) 0.083% nebulizer solution 2.5 mg  2.5 mg Nebulization Q2H PRN Elease EtienneAnand D Hongalgi, MD      . atorvastatin (LIPITOR) tablet 80 mg  80 mg Oral q morning - 10a Elease EtienneAnand D Hongalgi, MD   80 mg at 07/03/16 0950  . bisacodyl (DULCOLAX) suppository 10 mg  10 mg Rectal Daily PRN Elease EtienneAnand D Hongalgi, MD      . carvedilol (COREG) tablet 6.25 mg  6.25 mg Oral BID Elease EtienneAnand D Hongalgi, MD   6.25 mg at 07/03/16  0951  . clopidogrel (PLAVIX) tablet 75 mg  75 mg Oral Daily Bobette Moavid Manuel Ortiz, MD   75 mg at 07/03/16 0951  . enoxaparin (LOVENOX) injection 30 mg  30 mg Subcutaneous Q24H Bobette Moavid Manuel Ortiz, MD   30 mg at 07/03/16 0950  . furosemide (LASIX) injection 60 mg  60 mg Intravenous BID Delano Metzobert Schertz, MD   60 mg at 07/03/16 0817  . hydrALAZINE (APRESOLINE) tablet 10 mg  10 mg Oral TID Elease EtienneAnand D Hongalgi, MD   10 mg at 07/03/16 0950  . insulin aspart (novoLOG) injection 0-9 Units  0-9 Units Subcutaneous TID WC Elease EtienneAnand D Hongalgi, MD   2 Units at 07/02/16 1723  . isosorbide mononitrate (IMDUR) 24 hr tablet 30 mg  30 mg Oral Daily Elease EtienneAnand D Hongalgi, MD   30 mg at 07/03/16 0950  . loratadine (CLARITIN) tablet 10 mg  10 mg Oral Daily PRN Elease EtienneAnand D Hongalgi, MD      . nitroGLYCERIN (NITROSTAT) SL tablet 0.4 mg  0.4 mg Sublingual Q5 Min x 3 PRN Elease EtienneAnand D Hongalgi, MD      . ondansetron [facility](ZOFRAN) tablet 4 mg  4 mg Oral Q6H PRN Bobette Moavid Manuel Ortiz, MD      . paricalcitol [facility](ZEMPLAR) capsule 1 mcg  1 mcg Oral Daily Elease EtienneAnand D Hongalgi, MD   1 mcg at 07/03/16 0951  . polyethylene glycol (MIRALAX / GLYCOLAX) packet 17 g  17 g Oral Daily Elease EtienneAnand D Hongalgi, MD   17 g at 07/02/16 1722  . senna (SENOKOT) tablet 17.2 mg  2 tablet Oral Daily Elease EtienneAnand D Hongalgi, MD   17.2 mg at 07/02/16 1722  . warfarin (COUMADIN) tablet 4 mg  4 mg Oral q1800 Elease EtienneAnand D Hongalgi, MD   4 mg at 07/02/16 1723  . warfarin (COUMADIN) video   Does not apply Once Elease EtienneAnand D Hongalgi, MD      . Warfarin - Pharmacist Dosing Inpatient   Does not apply [id number] Elease EtienneAnand D Hongalgi, MD         Discharge Medications: Please see discharge summary for a list of discharge medications.  Relevant Imaging Results:  Relevant Lab Results:   Additional Information [id number]   Raye SorrowCoble, Jashay Roddy N, [place]

## 2016-07-03 NOTE — Clinical Social Work Note (Signed)
Clinical Social Work Assessment  Patient Details  Name: Dorothy Bartlett MRN: [id number] Date of Birth: 14-Dec-1926  Date of referral:  07/03/16               Reason for consult:  Facility Placement                Permission sought to share information with:  Case Manager, Magazine features editoracility Contact Representative, Family Supports Permission granted to share information::  Yes, Verbal Permission Granted  Name::        Agency::  SNF search  Relationship::  daughter (called via phone) Clayborn BignessNancy  Contact Information:     Housing/Transportation Living arrangements for the past 2 months:  Single Family Home Source of Information:  Patient, Medical Team, Adult Children Patient Interpreter Needed:  None Criminal Activity/Legal Involvement Pertinent to Current Situation/Hospitalization:  No - Comment as needed Significant Relationships:  Adult Children, Other Family Members Lives with:  Self Do you feel safe going back to the place where you live?  No Need for family participation in patient care:  Yes (Comment)  Care giving concerns:  LCSW following case as patient was not able to be accepted into CIR as family/patient first choice.  Discussed with family other options and all agree patient would benefit from ST SNF stay as family cannot care for her 24/7.  [place]  [facility] and also open to [facility] as back up option.   Social Worker assessment / plan:  LCSW received consult for SNF placement. Called family via phone as no family in room and patient deferred to family.  Spoke with daughter who is helping make decisions along with her brother. Discussed role in hospital and services offered. Accepting of  Assessment and consult.  Discussed disappointment with CIR not accepting patient, but open to SNF. Aware of SNF and process as patient has been in past where [facility].  Would like patient to return if bed available as  they are familiar with [facility], but also open to [facility] if they have a bed.  Will complete SNF work up and follow up with family regarding bed offers and assist with DC.  Employment status:  Retired Database administratornsurance information:  Managed Medicare PT Recommendations:  Skilled Holiday representativeursing Facility, Inpatient Rehab Consult Information / Referral to community resources:  Skilled Nursing Facility  Patient/Family's Response to care:  Agreeable to plan  Patient/Family's Understanding of and Emotional Response to Diagnosis, Current Treatment, and Prognosis:  Daughter is disappointment with not going to CIR, however understanding of need for SNF and agreeable to plan. She questions about patient's new medication Coumadin and how patient is responding to medication. Updated and allowed family to understand facility will also closely monitor patient on medication. Family active and engaged in patient's hospitalization and care.  Emotional Assessment Appearance:  Appears stated age Attitude/Demeanor/Rapport:  Other (cooperative, ) Affect (typically observed):  Accepting, Adaptable Orientation:  Oriented to Self, Oriented to Place, Oriented to Situation Alcohol / Substance use:  Not Applicable Psych involvement (Current and /or in the community):  No (Comment)  Discharge Needs  Concerns to be addressed:  No discharge needs identified Readmission within the last 30 days:  No Current discharge risk:  None Barriers to Discharge:  No Barriers Identified, Continued Medical Work up   Dorothy Bartlett, Dorothy Mcnicholas N, LCSW 07/03/2016, 11:06 AM

## 2016-07-03 NOTE — Progress Notes (Signed)
Physical Therapy Treatment Patient Details Name: Dorothy Bartlett MRN: [id number] DOB: Oct 01, 1926 Today's Date: 07/03/2016    History of Present Illness Dorothy Rushie ChestnutH Arakawa is a 82 y.o. female with medical history significant of hyperlipidemia, coronary artery stenosis, hypertension, CAD, chronic combined systolic and diastolic heart failure, PVD,obesity, intrinsic asthma, stage V chronic kidney disease was brought to the emergency department after she was found by her daughter on the front porch with right-sided facial droop, drooling and slurred speech and right-sided upper extremity paresis. The patient was placed on BiPAP ventilation due to dyspnea and hypoxia. CT was negative CXR showed mild CHF.     PT Comments    Progressing steadily with better R side function, improved gait, but will need further rehab.   Follow Up Recommendations  SNF     Equipment Recommendations       Recommendations for Other Services       Precautions / Restrictions Precautions Precautions: Fall Restrictions Weight Bearing Restrictions: No    Mobility  Bed Mobility               General bed mobility comments: pt up in the chair  Transfers Overall transfer level: Needs assistance Equipment used: Rolling walker (2 wheeled) Transfers: Sit to/from Stand Sit to Stand: Min assist         General transfer comment: stability assist  Ambulation/Gait Ambulation/Gait assistance: Min assist Ambulation Distance (Feet): 150 Feet Assistive device: Rolling walker (2 wheeled) Gait Pattern/deviations: Step-through pattern   Gait velocity interpretation: Below normal speed for age/gender General Gait Details: Initially drifting to the right needing assistance to correct, but with cuing pt improved to be able to guide RW herself, min stability   Stairs            Wheelchair Mobility    Modified Rankin (Stroke Patients Only) Modified Rankin (Stroke Patients Only) Pre-Morbid Rankin Score:  Slight disability Modified Rankin: Moderately severe disability     Balance Overall balance assessment: Needs assistance Sitting-balance support: No upper extremity supported Sitting balance-Leahy Scale: Good     Standing balance support: Single extremity supported;During functional activity Standing balance-Leahy Scale: Fair Standing balance comment: MOST COMFORTABLE WITH ONE ARM ASSIST'                    Cognition Arousal/Alertness: Awake/alert Behavior During Therapy: WFL for tasks assessed/performed Overall Cognitive Status: Within Functional Limits for tasks assessed Area of Impairment: Following commands;Problem solving;Orientation;Attention;Awareness Orientation Level: Situation;Time Current Attention Level: Selective   Following Commands: Follows one step commands with increased time     Problem Solving: Slow processing      Exercises General Exercises - Upper Extremity Shoulder Flexion: AAROM;Right;10 reps;Seated Shoulder Extension: AAROM;Right;10 reps;Seated Elbow Flexion: AAROM;Right;10 reps;Seated Elbow Extension: AAROM;Right;10 reps;Seated Wrist Flexion: AAROM;Right;10 reps;Seated Wrist Extension: AAROM;Right;10 reps;Seated Digit Composite Flexion: AROM;Right;10 reps;Seated Composite Extension: AROM;Right;10 reps;Seated    General Comments        Pertinent Vitals/Pain Pain Assessment: No/denies pain    Home Living                      Prior Function            PT Goals (current goals can now be found in the care plan section) Acute Rehab PT Goals Patient Stated Goal: to be independent again PT Goal Formulation: With patient/family Time For Goal Achievement: 07/14/16 Potential to Achieve Goals: Good Progress towards PT goals: Progressing toward goals    Frequency  Min 4X/week  PT Plan Discharge plan needs to be updated    Co-evaluation             End of Session   Activity Tolerance: Patient tolerated treatment  well Patient left: in chair;with call bell/phone within reach;with chair alarm set;with family/visitor present     Time: 1346-1405 PT Time Calculation (min) (ACUTE ONLY): 19 min  Charges:  $Gait Training: 8-22 mins                    G Codes:      Marixa Mellott, Eliseo GumKenneth V 07/03/2016, 2:28 PM  07/03/2016  [place] BingKen Nayelis Bonito, PT [phone]  (pager)

## 2016-07-03 NOTE — Progress Notes (Signed)
Occupational Therapy Treatment Patient Details Name: Dorothy Bartlett MRN: [id number] DOB: January 24, 1926 Today's Date: 07/03/2016    History of present illness Dorothy Bartlett is a 82 y.o. female with medical history significant of hyperlipidemia, coronary artery stenosis, hypertension, CAD, chronic combined systolic and diastolic heart failure, PVD,obesity, intrinsic asthma, stage V chronic kidney disease was brought to the emergency department after she was found by her daughter on the front porch with right-sided facial droop, drooling and slurred speech and right-sided upper extremity paresis. The patient was placed on BiPAP ventilation due to dyspnea and hypoxia. CT was negative CXR showed mild CHF.    OT comments  Patient continues to make progress towards OT goals, will continue plan of care for now. Pt continues to be limited by balance, cognition, and decreased functional use of RUE. CIR denied patient, therefore recommending SNF for post acute rehab.    Follow Up Recommendations  SNF;Supervision/Assistance - 24 hour (CIR denied)    Equipment Recommendations  Other (comment) (TBD next venue of care)    Recommendations for Other Services  None at this time   Precautions / Restrictions Precautions Precautions: Fall Restrictions Weight Bearing Restrictions: No    Mobility Bed Mobility General bed mobility comments: Pt found seated EOB upon OT entering room and seated in recliner upon OT exiting room   Transfers Overall transfer level: Needs assistance Equipment used: Rolling walker (2 wheeled) Transfers: Sit to/from Stand Sit to Stand: Min assist         General transfer comment: Min assist for safety, Rt knee without buckling     Balance Overall balance assessment: Needs assistance Sitting-balance support: No upper extremity supported;Feet unsupported Sitting balance-Leahy Scale: Good     Standing balance support: Single extremity supported;During functional  activity Standing balance-Leahy Scale: Fair Standing balance comment: Pt standing at sink with single UE supported, brushing teeth   ADL Overall ADL's : Needs assistance/impaired General ADL Comments: Pt found seated EOB with goddaughter present. Pt willing and eager to work with therapist. Pt reports that she completed her exercsies since yesterday's session. Pt stood with RW and ambulated a couple feet to recliner with min assist. Therapist then assisted pt to sink side and pt performed grooming task of brushing teeth standing at sink with min guard assist. Pt then sat in recliner and completed RUE HEP with mod verbal cueing for memory and technique.      Cognition   Behavior During Therapy: WFL for tasks assessed/performed Overall Cognitive Status: Impaired/Different from baseline Area of Impairment: Following commands;Problem solving;Orientation;Attention;Awareness Orientation Level: Situation;Time Current Attention Level: Selective    Following Commands: Follows one step commands with increased time;Follows multi-step commands inconsistently     Problem Solving: Slow processing;Decreased initiation;Requires verbal cues;Difficulty sequencing;Requires tactile cues        Exercises General Exercises - Upper Extremity Shoulder Flexion: AAROM;Right;10 reps;Seated Shoulder Extension: AAROM;Right;10 reps;Seated Elbow Flexion: AAROM;Right;10 reps;Seated Elbow Extension: AAROM;Right;10 reps;Seated Wrist Flexion: AAROM;Right;10 reps;Seated Wrist Extension: AAROM;Right;10 reps;Seated Digit Composite Flexion: AROM;Right;10 reps;Seated Composite Extension: AROM;Right;10 reps;Seated           Pertinent Vitals/ Pain       Pain Assessment: No/denies pain         Frequency Min 2X/week     Progress Toward Goals  OT Goals(current goals can now be found in the care plan section)  Progress towards OT goals: Progressing toward goals  Acute Rehab OT Goals Patient Stated Goal: to be  independent again OT Goal Formulation: With patient/family Time  For Goal Achievement: 07/15/16 Potential to Achieve Goals: Good  Plan Discharge plan needs to be updated       End of Session Equipment Utilized During Treatment: Gait belt;Rolling walker   Activity Tolerance Patient tolerated treatment well   Patient Left in chair;with call bell/phone within reach;with family/visitor present     Time: [id number] OT Time Calculation (min): 22 min  Charges: OT General Charges $OT Visit: 1 Procedure OT Treatments $Self Care/Home Management : 8-22 mins  Edwin CapPatricia Owin Vignola , MS, OTR/L, CLT Pager: [phone]  07/03/2016, 11:54 AM

## 2016-07-03 NOTE — Care Management Note (Signed)
Case Management Note  Patient Details  Name: Dorothy Bartlett MRN: [id number] Date of Birth: Jun 10, 1926  Subjective/Objective:                    Action/Plan: Per CIR note patient is more appropriate for SNF rehab. CM informed CSW and will continue to follow for d/c needs.   Expected Discharge Date:  07/03/16               Expected Discharge Plan:     In-House Referral:     Discharge planning Services     Post Acute Care Choice:    Choice offered to:     DME Arranged:    DME Agency:     HH Arranged:    HH Agency:     Status of Service:     If discussed at [employer] of [employer] Meetings, dates discussed:    Additional Comments:  Kermit BaloKelli F Oshae Simmering, RN 07/03/2016, 10:49 AM

## 2016-07-03 NOTE — Progress Notes (Signed)
ANTICOAGULATION CONSULT NOTE - Initial Consult  Pharmacy Consult for Coumadin Indication: stroke, afib  No Known Allergies  Patient Measurements: Height: 5\' 4"  (162.6 cm) Weight: 168 lb 14 oz (76.6 kg) IBW/kg (Calculated) : 54.7  Vital Signs: Temp: 98.9 F (37.2 C) (07/04 0948) Temp Source: Oral (07/04 0948) BP: 131/48 mmHg (07/04 0948) Pulse Rate: 71 (07/04 0948)  Labs:  Recent Labs  07/01/16 0346 07/02/16 0306 07/03/16 0634  HGB 9.8* 9.5*  --   HCT 32.9* 31.4*  --   PLT 187 186  --   LABPROT  --   --  15.2  INR  --   --  1.18  CREATININE 4.93* 4.96* 4.97*    Estimated Creatinine Clearance: 7.8 mL/min (by C-G formula based on Cr of 4.97).   Assessment: 82 year old female to begin Coumadin for Afib s/p stroke. INR= 1.18 on day 2 coumadin.   Goal of Therapy:  INR 2-3 Monitor platelets by anticoagulation protocol: Yes   Plan:  Coumadin 4 mg po daily at 1800 pm for now Daily INR  Harland GermanAndrew Debi Cousin, Pharm D 07/03/2016 11:03 AM

## 2016-07-03 NOTE — Clinical Social Work Placement (Addendum)
   CLINICAL SOCIAL WORK PLACEMENT  NOTE  Date:  07/03/2016  Patient Details  Name: Dorothy Bartlett MRN: [id number] Date of Birth: 08-Apr-1926  Clinical Social Work is seeking post-discharge placement for this patient at the Skilled  Nursing Facility level of care (*CSW will initial, date and re-position this form in  chart as items are completed):  Yes   Patient/family provided with [facility] Clinical Social Work Department's list of facilities offering this level of care within the geographic area requested by the patient (or if unable, by the patient's family).  Yes   Patient/family informed of their freedom to choose among providers that offer the needed level of care, that participate in Medicare, Medicaid or managed care program needed by the patient, have an available bed and are willing to accept the patient.  Yes   Patient/family informed of [place]'s ownership interest in [facility] and [facility], as well as of the fact that they are under no obligation to receive care at these facilities.  PASRR submitted to EDS on       PASRR number received on       Existing PASRR number confirmed on 07/03/16     FL2 transmitted to all facilities in geographic area requested by pt/family on 07/03/16     FL2 transmitted to all facilities within larger geographic area on       Patient informed that his/her managed care company has contracts with or will negotiate with certain facilities, including the following:         07-04-16   Patient/family informed of bed offers received. (Updated 07-04-16 Windell MouldingEric Emeril Stille, MSW, LCSWA,)  Patient [facility] bed at  [facility] (Updated 07-04-16 Windell MouldingEric Helix Lafontaine, MSW, LCSWA,)     Physician recommends and patient chooses bed at     [facility]   07-04-16 (Updated 07-04-16 Windell MouldingEric Ina Poupard, MSW, LCSWA,).  Patient to be transferred to facility by  [facility] (Updated 07-04-16 Windell MouldingEric Stanislawa Gaffin, MSW, LCSWA,)    Patient family  notified on  07/04/16 of transfer (Updated 07-04-16 Windell MouldingEric Faysal Fenoglio, MSW, LCSWA,).  Name of family member notified:   Landis GandyNancy McCoy (Updated 07-04-16 Windell MouldingEric Aryaa Bunting, MSW, LCSWA,)     PHYSICIAN Please prepare prescriptions, Please sign FL2     Additional Comment:    _______________________________________________ Raye Sorrowoble, Hannah N, LCSW 07/03/2016, 11:10 AM  Ervin KnackEric R. Britney Captain, MSW, Theresia MajorsLCSWA [phone] 07/04/2016 3:07 PM

## 2016-07-03 NOTE — Progress Notes (Addendum)
PROGRESS NOTE  NATSUKO KELSAY  WUJ:[id number] DOB: 08-12-1926  DOA: 06/29/2016 PCP: Eliott Nine, MD  Primary Nephrologist: Dr. Elvis Coil   Brief Narrative:  82 y.o. female with medical history significant of hyperlipidemia, hypertension, DM 2, multivessel CAD, chronic combined systolic and diastolic heart failure, permanent pacemaker for high degree AV block, PVD,obesity, intrinsic asthma, stage V chronic kidney disease was brought to the emergency department after she was found by her daughter on the front porch with right-sided facial droop, drooling and slurred speech and right-sided upper extremity paresis. Code stroke was activated. Imaging does not show acute stroke. Admitted to stepdown due to acute respiratory failure, on BiPAP. Neurology consulting for stroke workup. Nephrology consulted. Respiratory status improved-has been on oxygen via nasal cannula since 7/1 AM and has not required BiPAP. Transfer to medical bed 7/3. Rehabilitation M.D. consulted and recommends SNF. Coumadin initiated on 7/3 for suspected embolic stroke after extensive discussion with neurology, primary cardiologist, patient and daughter. DC to SNF when bed available.   Assessment & Plan:   Principal Problem:   Acute CVA (cerebrovascular accident) (HCC) Active Problems:   Hyperlipidemia   CAD S/P PTCA 1995   Intrinsic asthma   Chronic combined systolic and diastolic heart failure (HCC)   Type 2 diabetes mellitus with renal manifestations (HCC)   Essential hypertension   AKI (acute kidney injury) (HCC)   Acute ischemic stroke (HCC)   Acute respiratory failure with hypoxia (HCC)   Chronic kidney disease, stage V (HCC)   Dysphagia, post-stroke   Stroke (HCC)   HLD (hyperlipidemia)   Paroxysmal atrial fibrillation (HCC)   Suspected acute left CVA  - Residual dysarthria, facial asymmetry and right hemiparesis. - CTA head 06/29/16: Small vessel disease changes without CT evidence of large acute  infarct. Unable to do MRI due to pacemaker. Hence repeated CT head 07/01/16: No intracranial hemorrhage or CT evidence of large acute infarct. - CTA head and neck: Moderate to marked narrowing bilateral ICA - 2-D echo 07/01/16: LVEF 60-65 percent and diastolic dysfunction. - Hemoglobin A1c: 5.4. - LDL 83. - Stroke in follow-up appreciated. Prior to admission, patient was on aspirin 81 MG daily. In the hospital, initially switched to Plavix 75 MG daily. On 7/3, neurology investigated that patient's device check on 03/19/16 and 06/19/16 showed atrial fibrillation. Due to concerns of A. fib as possible etiology of stroke, anticoagulation was recommended. Patient denies fall risks or bleeding complications. After discussing with patient, daughter, primary cardiologist Dr. Katrinka Blazing, patient was agreeable and Coumadin was initiated 07/02/16 with Plavix bridge. Discontinue Plavix when INR therapeutic >2. Follow-up with Dr. Roda Shutters with [facility] in 2 months.  Acute respiratory failure with hypoxia - Precipitated by acute on chronic combined CHF, underlying severe pulmonary hypertension and asthma. - Admitted to stepdown and placed on BiPAP. Started IV Lasix and increased by nephrology. - 2-D echo 05/09/15: LVEF 40-45 percent, aortic sclerosis, moderate MR, PA peak pressures 72 mmHg. - -985 mL since admission. - Hypoxia resolved. Saturating in the 90s-100 100s on room air.  Acute on chronic combined CHF - Treated with IV Lasix and nitrates. 2-D echo: LVEF 60-65%. Some of volume overload related to advanced chronic kidney disease but probably not HD candidate. - Change IV Lasix to oral. Titrate up carvedilol.  Acute on stage V chronic kidney disease - Last creatinine prior to this admission on 07/05/15:3.39. Admitted with creatinine of 5.19 > 5.29. Either has acute kidney injury or progression of chronic kidney disease. Likely not candidate  for HD - Did receive contrast for CTA head and neck on 6/30. -  Nephrology consultation appreciated-signed off. Creatinine has stabilized in the 4.9 range-probably a new baseline.  Hyperlipidemia LDL 83. Lipitor increased to 80 mg daily from 40 MG daily.  CTA status post PTCA 1995 - Continue statins.  - Discussed with patient's primary cardiologist Dr. Katrinka BlazingSmith on 7/4: Once patient therapeutic on Coumadin, no need to continue antiplatelets and Plavix may be discontinued.  Asthma - Stable.   DM 2 with renal manifestations - Diabetic diet. SSI.  Essential hypertension - Carvedilol was resumed at half dose yesterday. Increase to prior home dose of 12.5 MG twice a day.  Right ICA stenosis >75%.  Right lung abnormalities - Follow-up CT chest in 3 months. - This changes were seen on CTA neck. Bilateral pleural effusions greater on right. Lung parenchymal changes partially reflective of pulmonary vascular congestion however, groundglass opacities throughout the right lung apex including 1.4 cm rounded consolidation. This appearance is atypical for congestive heart and hence follow-up CT recommended by radiology.  S/P permanent pacemaker.  Dysphagia - Secondary to CVA. As per speech therapy follow-up, diet upgraded to dysphagia 2 and thin liquids.  Anemia in chronic kidney disease - Stable.    DVT prophylaxis: Lovenox Code Status: Full Family Communication: Discussed with patient. Discussed with patient's stepdaughter at bedside on 7/4. Disposition Plan: DC location to be determined >CIR input pending. Admitted to stepdown unit. Transferred to medical bed 7/3. DC to SNF when bed available.   Consultants:   Neurology   Nephrology-signed off  CIR  Procedures:   BIPAP   2-D echo 07/01/16: Study Conclusions  - Left ventricle: The cavity size was normal. Wall thickness was  normal. Systolic function was normal. The estimated ejection  fraction was in the range of 60% to 65%. Wall motion was normal;  there were no regional wall motion  abnormalities. Doppler  parameters are consistent diastolic dysfunction. The E/e&' ratio  is >20, suggesting markedly elevated LV filling pressure. - Aortic valve: Trileaflet. Sclerotic leaflet tips without  stenosis. There was no regurgitation. - Mitral valve: Calcified annulus. Mildly thickened leaflets .  There was mild to moderate regurgitation. - Left atrium: Moderately dilated. - Right ventricle: The cavity size was normal. Wall thickness was  normal. The moderator band was prominent. - Right atrium: The atrium was at the upper limits of normal in  size. - Tricuspid valve: There was moderate regurgitation. - Pulmonic valve: Poorly visualized. There was mild regurgitation. - Pulmonary arteries: Dilated. PA peak pressure: 62 mm Hg (S). - Inferior vena cava: The vessel was normal in size. The  respirophasic diameter changes were in the normal range (>= 50%),  consistent with normal central venous pressure.  Impressions:  - Compared to a prior echo in 2016, the LVEF is higher at 60-65%.  The estimated PA pressure is lower in this study, however, left  and right heart pressures are elevated.   Antimicrobials:   None    Subjective: Right sided strength improving. Speech and facial asymmetry have also improved. No dyspnea or chest pain. Denies any other complaints. States that she lived alone prior to admission and used walker only at times and denies falls or history of bleeding.  Objective:  Filed Vitals:   07/03/16 0400 07/03/16 0456 07/03/16 0948 07/03/16 1340  BP: 142/72 151/63 131/48 134/65  Pulse: 64 71 71 65  Temp: 98.2 F (36.8 C) 98.5 F (36.9 C) 98.9 F (37.2 C) 97.9 F (  36.6 C)  TempSrc: Oral Oral Oral Oral  Resp: Height:      Weight:      SpO2: 96% 94% 96% 100%    Intake/Output Summary (Last 24 hours) at 07/03/16 1510 Last data filed at 07/03/16 1300  Gross per 24 hour  Intake    480 ml  Output      0 ml  Net    480 ml    Filed Weights   07/01/16 0330 07/02/16 0400 07/02/16 1157  Weight: 76.7 kg (169 lb 1.5 oz) 75.7 kg (166 lb 14.2 oz) 76.6 kg (168 lb 14 oz)    Examination:  General exam: Pleasant elderly female, sitting comfortably at edge of bed this morning. Respiratory system: clear to auscultation without wheezing, rhonchi. Respiratory effort normal. Cardiovascular system: S1 & S2 heard, RRR. No JVD, murmurs, rubs, gallops or clicks. Trace ankle edema.  Gastrointestinal system: Abdomen is nondistended, soft and nontender. No organomegaly or masses felt. Normal bowel sounds heard. Central nervous system: Alert and oriented. Dysarthria, facial asymmetry - continues to improve. Extremities: grade 4+ x 5 power at least in left limbs. Right pronator drift. Right upper extremity 4 x 5 power & right lower extremity 4 x 5 power.  Skin: No rashes, lesions or ulcers Psychiatry: Judgement and insight appear normal. Mood & affect appropriate.     Data Reviewed: I have personally reviewed following labs and imaging studies  CBC:  Recent Labs Lab 06/29/16 1711 06/29/16 1712 06/30/16 0731 07/01/16 0346 07/02/16 0306  WBC 5.7  --  6.2 7.0 7.5  NEUTROABS 4.1  --  4.5 5.9 4.8  HGB 10.0* 11.2* 10.0* 9.8* 9.5*  HCT 32.0* 33.0* 33.5* 32.9* 31.4*  MCV 98.2  --  98.8 99.4 99.7  PLT 217  --  212 187 186   Basic Metabolic Panel:  Recent Labs Lab 06/29/16 1711 06/29/16 1712 06/30/16 0731 07/01/16 0346 07/02/16 0306 07/03/16 0634  NA 137 139 142 140 143 138  K 4.2 4.2 4.0 3.8 3.8 3.6  CL 99* 99* 100* 101 93* 96*  CO2 26  --  GLUCOSE 170* 165* 80 73 75 73  BUN 65* 61* 64* 64* 64* 65*  CREATININE 5.19* 5.20* 5.29* 4.93* 4.96* 4.97*  CALCIUM 10.4*  --  10.2 10.0 11.0* 10.2  MG  --   --  2.6*  --   --   --    GFR: Estimated Creatinine Clearance: 7.8 mL/min (by C-G formula based on Cr of 4.97). Liver Function Tests:  Recent Labs Lab 06/29/16 1711  AST 26  ALT 18  ALKPHOS 56   BILITOT 1.0  PROT 7.2  ALBUMIN 3.8   No results for input(s): LIPASE, AMYLASE in the last 168 hours. No results for input(s): AMMONIA in the last 168 hours. Coagulation Profile:  Recent Labs Lab 06/29/16 1711 07/03/16 0634  INR 1.21 1.18   Cardiac Enzymes: No results for input(s): CKTOTAL, CKMB, CKMBINDEX, TROPONINI in the last 168 hours. BNP (last 3 results) No results for input(s): PROBNP in the last 8760 hours. HbA1C: No results for input(s): HGBA1C in the last 72 hours. CBG:  Recent Labs Lab 07/02/16 1139 07/02/16 1637 07/02/16 2155 07/03/16 0624 07/03/16 1127  GLUCAP 124* 177* 110* 83 192*   Lipid Profile: No results for input(s): CHOL, HDL, LDLCALC, TRIG, CHOLHDL, LDLDIRECT in the last 72 hours. Thyroid Function Tests: No results for input(s): TSH, T4TOTAL, FREET4, T3FREE, THYROIDAB in the last  72 hours. Anemia Panel: No results for input(s): VITAMINB12, FOLATE, FERRITIN, TIBC, IRON, RETICCTPCT in the last 72 hours.  Sepsis Labs: No results for input(s): PROCALCITON, LATICACIDVEN in the last 168 hours.  No results found for this or any previous visit (from the past 240 hour(s)).       Radiology Studies: No results found.      Scheduled Meds: . atorvastatin  80 mg Oral q morning - 10a  . carvedilol  6.25 mg Oral BID  . clopidogrel  75 mg Oral Daily  . enoxaparin (LOVENOX) injection  30 mg Subcutaneous Q24H  . furosemide  60 mg Intravenous BID  . hydrALAZINE  10 mg Oral TID  . insulin aspart  0-9 Units Subcutaneous TID WC  . isosorbide mononitrate  30 mg Oral Daily  . paricalcitol  1 mcg Oral Daily  . polyethylene glycol  17 g Oral Daily  . senna  2 tablet Oral Daily  . warfarin  4 mg Oral q1800  . warfarin   Does not apply Once  . Warfarin - Pharmacist Dosing Inpatient   Does not apply q1800   Continuous Infusions:    LOS: 4 days    Time spent: 25 minutes.    [facility],[place] Souter, MD [facility] Pager [phone]  If 7PM-7AM,  please contact night-coverage [email or web address] Password [email or web address] 07/03/2016, 3:10 PM

## 2016-07-04 ENCOUNTER — Telehealth: Payer: Self-pay | Admitting: Interventional Cardiology

## 2016-07-04 LAB — BASIC METABOLIC PANEL
Anion gap: 14 (ref 5–15)
BUN: 70 mg/dL — AB (ref 6–20)
CALCIUM: 10.2 mg/dL (ref 8.9–10.3)
CO2: 28 mmol/L (ref 22–32)
CREATININE: 4.94 mg/dL — AB (ref 0.44–1.00)
Chloride: 96 mmol/L — ABNORMAL LOW (ref 101–111)
GFR, EST AFRICAN AMERICAN: 8 mL/min — AB (ref >60)
GFR, EST NON AFRICAN AMERICAN: 7 mL/min — AB (ref >60)
Glucose, Bld: 136 mg/dL — ABNORMAL HIGH (ref 65–99)
Potassium: 3.4 mmol/L — ABNORMAL LOW (ref 3.5–5.1)
SODIUM: 138 mmol/L (ref 135–145)

## 2016-07-04 LAB — GLUCOSE, CAPILLARY
GLUCOSE-CAPILLARY: 133 mg/dL — AB (ref 65–99)
Glucose-Capillary: 194 mg/dL — ABNORMAL HIGH (ref 65–99)

## 2016-07-04 LAB — PROTIME-INR
INR: 1.23 (ref 0.00–1.49)
Prothrombin Time: 15.7 seconds — ABNORMAL HIGH (ref 11.6–15.2)

## 2016-07-04 MED ORDER — WARFARIN SODIUM 4 MG PO TABS: 4.0000 mg | ORAL_TABLET | Freq: Every day | ORAL | Status: DC

## 2016-07-04 MED ORDER — FUROSEMIDE 80 MG PO TABS: 80.0000 mg | ORAL_TABLET | Freq: Two times a day (BID) | ORAL | Status: DC

## 2016-07-04 MED ORDER — IOPAMIDOL (ISOVUE-370) INJECTION 76%
50.0000 mL | Freq: Once | INTRAVENOUS | Status: AC | PRN
  Administered 2016-06-29: 50 mL via INTRAVENOUS

## 2016-07-04 MED ORDER — CLOPIDOGREL BISULFATE 75 MG PO TABS: 75.0000 mg | ORAL_TABLET | Freq: Every day | ORAL | Status: DC

## 2016-07-04 MED ORDER — POTASSIUM CHLORIDE CRYS ER 20 MEQ PO TBCR
20.0000 meq | EXTENDED_RELEASE_TABLET | Freq: Once | ORAL | Status: AC
  Administered 2016-07-04: 20 meq via ORAL
  Filled 2016-07-04: qty 1

## 2016-07-04 MED ORDER — POTASSIUM CHLORIDE CRYS ER 20 MEQ PO TBCR: 20.0000 meq | EXTENDED_RELEASE_TABLET | Freq: Once | ORAL | Status: AC

## 2016-07-04 MED ORDER — WARFARIN VIDEO
Freq: Once | Status: AC
  Administered 2016-07-04: 11:00:00

## 2016-07-04 MED ORDER — ATORVASTATIN CALCIUM 80 MG PO TABS: 80.0000 mg | ORAL_TABLET | Freq: Every morning | ORAL | Status: DC

## 2016-07-04 NOTE — Care Management Note (Signed)
Case Management Note  Patient Details  Name: Dorothy FraiseCorine H Sula MRN: [id number] Date of Birth: 03-11-1926  Subjective/Objective:                    Action/Plan: Pt discharging to [facility] today. No further needs per CM.   Expected Discharge Date:  07/03/16               Expected Discharge Plan:  Skilled Nursing Facility  In-House Referral:  Clinical Social Work  Discharge planning Services  CM Consult  Post Acute Care Choice:    Choice offered to:     DME Arranged:    DME Agency:     HH Arranged:    HH Agency:     Status of Service:  Completed, signed off  If discussed at [employer] Length of [employer] Meetings, dates discussed:    Additional Comments:  Kermit BaloKelli F Rowena Moilanen, RN 07/04/2016, 4:02 PM

## 2016-07-04 NOTE — Telephone Encounter (Signed)
Spoke with pt's daughter Harriett Sineancy. Pt had a stroke on 6/30. She is being d/c from the hospital to a rehab facility. Pt recent hospital d/c has pt continuing Lasix 80mg  bid. Per Dr.Smith instruction pt was to take that dose for x1 week. Harriett Sineancy would like clarification on pt's Lasix dosage. Adv her that I will fwd the message to Dr.Smith and call back with his response.

## 2016-07-04 NOTE — Progress Notes (Signed)
Pt [facility] at this time [facility] taking all personal belongings. No noted distress. Pt denies pain or discomfort. Report called in to nurse, Renea EeEvelyn.

## 2016-07-04 NOTE — Telephone Encounter (Signed)
New message:   Pt had a stroke last Friday. Her Lasix have been changed  To 80 mg 2 times a day for one week.Dowes she still need to stay on 60 mg or go back to 40 mg 2 times a day? On her discharge sheet it said 80 mg 2 times a day.

## 2016-07-04 NOTE — Discharge Instructions (Signed)

## 2016-07-04 NOTE — Progress Notes (Signed)
Rehab admissions - I met with patient, god daughter and spoke with daughter on the phone.  Daughter is caring for her husband with cancer and does not feel she can care for patient 24/7.  Rehab MD feels that patient will need supervision to min assist even after a rehab stay.  Daughter wants to pursue [facility] or [facility] SNF to allow sufficient recovery time.  God daughter is returning to work next month, so caregiver support is very limited.  Call me for questions.  #[phone]

## 2016-07-04 NOTE — Progress Notes (Signed)
ANTICOAGULATION CONSULT NOTE  Pharmacy Consult for Coumadin Indication: stroke, afib  No Known Allergies  Patient Measurements: Height: 5\' 4"  (162.6 cm) Weight: 168 lb 14 oz (76.6 kg) IBW/kg (Calculated) : 54.7  Vital Signs: Temp: 98.7 F (37.1 C) (07/05 0610) Temp Source: Oral (07/05 0610) BP: 128/48 mmHg (07/05 0610) Pulse Rate: 70 (07/05 0610)  Labs:  Recent Labs  07/02/16 0306 07/03/16 0634 07/04/16 0205  HGB 9.5*  --   --   HCT 31.4*  --   --   PLT 186  --   --   LABPROT  --  15.2 15.7*  INR  --  1.18 1.23  CREATININE 4.96* 4.97* 4.94*    Estimated Creatinine Clearance: 7.9 mL/min (by C-G formula based on Cr of 4.94).  Assessment: 82 year old female started on warfarin this admission for Afib s/p stroke.  INR 1.23, CBC from 7/3 was stable and no reported bleeding.      Goal of Therapy:  INR 2-3 Monitor platelets by anticoagulation protocol: Yes    Plan:  1. Coumadin 4 mg po daily at 1800 pm for now 2. Daily INR 3. Noted plans to stop Plavix once INR is therapeutic   Pollyann SamplesAndy Chayce Rullo, PharmD, BCPS 07/04/2016, 8:29 AM Pager: [phone]

## 2016-07-04 NOTE — Clinical Social Work Note (Signed)
[facility].  Patient and family agreeable to plans will transport via ems RN to call report to [phone] patient's daughter Landis Gandyancy McCoy was notified and is at bedside .  Windell MouldingEric Itzelle Gains, MSW, Theresia MajorsLCSWA [phone]

## 2016-07-04 NOTE — Progress Notes (Signed)
PT Cancellation Note  Patient Details Name: Dorothy Bartlett MRN: [id number] DOB: 1926-07-26   Cancelled Treatment:    Reason Eval/Treat Not Completed: Patient declined, no reason specified.  Discharging today, not feeling like getting up. 07/04/2016  [place] [name], PT [phone]  (pager)   Dorothy Bartlett, [name] 07/04/2016, 3:22 PM

## 2016-07-04 NOTE — Discharge Summary (Signed)
Physician Discharge Summary  Dorothy Bartlett XLK:[id number] DOB: [birth year] DOA: 06/29/2016  PCP: Eliott Nine, MD  Admit date: 06/29/2016 Discharge date: 07/04/2016  Admitted From: Home Disposition:  SNF  Recommendations for Outpatient Follow-up:  1. Follow up with PCP in 1-2 weeks 2. Please obtain CBC and INR on 07/06/16 3. Discontinue plavix when INR > 2.0 4. BMP on 07/06/16  Home Health: None Equipment/Devices:none  Discharge Condition: Stable CODE STATUS: FULL Diet recommendation: Heart Healthy / Carb Modified   Brief/Interim Summary: 82 y.o. female with medical history significant of hyperlipidemia, hypertension, DM 2, multivessel CAD, chronic combined systolic and diastolic heart failure, permanent pacemaker for high degree AV block, PVD,obesity, intrinsic asthma, stage V chronic kidney disease was brought to the emergency department after she was found by her daughter on the front porch with right-sided facial droop, drooling and slurred speech and right-sided upper extremity paresis. Code stroke was activated. Imaging does not show acute stroke. Admitted to stepdown due to acute respiratory failure, on BiPAP. Neurology consulting for stroke workup. Nephrology consulted. Respiratory status improved-has been on oxygen via nasal cannula since 7/1 AM and has not required BiPAP. Transfer to medical bed 7/3. Rehabilitation M.D. consulted and recommends SNF. Coumadin initiated on 7/3 for suspected embolic stroke after extensive discussion with neurology, primary cardiologist, patient and daughter.  Discharge Diagnoses:  acute left brain stroke - Residual dysarthria, facial asymmetry and right hemiparesis. - CTA head 06/29/16: Small vessel disease changes without CT evidence of large acute infarct. Unable to do MRI due to pacemaker. Hence repeated CT head 07/01/16: No intracranial hemorrhage or CT evidence of large acute infarct. - CTA head and neck: Moderate to marked narrowing  bilateral ICA - 2-D echo 07/01/16: LVEF 60-65 percent and diastolic dysfunction. - Hemoglobin A1c: 5.4. - LDL 83. - Stroke in follow-up appreciated. Prior to admission, patient was on aspirin 81 MG daily. In the hospital, initially switched to Plavix 75 MG daily. On 7/3, neurology investigated that patient's device check on 03/19/16 and 06/19/16 showed atrial fibrillation. Due to concerns of A. fib as possible etiology of stroke, anticoagulation was recommended. Patient denies fall risks or bleeding complications. After discussing with patient, daughter, primary cardiologist Dr. Katrinka Blazing, patient was agreeable and Coumadin was initiated 07/02/16 with Plavix bridge. Discontinue Plavix when INR therapeutic >2. Follow-up with Dr. Roda Shutters with [facility] in 2 months.  Acute respiratory failure with hypoxia - Precipitated by acute on chronic combined CHF, underlying severe pulmonary hypertension and asthma. - Admitted to stepdown and placed on BiPAP. Started IV Lasix and increased by nephrology. - 2-D echo 05/09/15: LVEF 40-45 percent, aortic sclerosis, moderate MR, PA peak pressures 72 mmHg. - -985 mL since admission. - Hypoxia resolved. Saturating in the 90s-100 100s on room air.  Acute on chronic combined CHF - Treated with IV Lasix and nitrates. 2-D echo: LVEF 60-65%. Some of volume overload related to advanced chronic kidney disease but probably not HD candidate. - Change IV Lasix to oral. Titrate up carvedilol. -d/c with lasix 80 mg po bid  Acute on stage V chronic kidney disease - Last creatinine prior to this admission on 07/05/15:3.39. Admitted with creatinine of 5.19 > 5.29. Either has acute kidney injury or progression of chronic kidney disease. Likely not candidate for HD - Did receive contrast for CTA head and neck on 6/30. - Nephrology consultation appreciated-signed off. Creatinine has stabilized in the 4.9 range-probably a new baseline.  Hyperlipidemia LDL 83. Lipitor increased to  80 mg daily from 40  MG daily.  CTA status post PTCA 1995 - Continue statins.  - Discussed with patient's primary cardiologist Dr. Katrinka Blazing on 7/4: Once patient therapeutic on Coumadin, no need to continue antiplatelets and Plavix may be discontinued.  Asthma - Stable.  DM 2 with renal manifestations - Diabetic diet. SSI. - d/c with Januvia 25 mg daily  Essential hypertension - Carvedilol was resumed at half dose yesterday. Increase to prior home dose of 12.5 MG twice a day.  Right ICA stenosis >75%.  Right lung abnormalities - Follow-up CT chest in 3 months. - This changes were seen on CTA neck. Bilateral pleural effusions greater on right. Lung parenchymal changes partially reflective of pulmonary vascular congestion however, groundglass opacities throughout the right lung apex including 1.4 cm rounded consolidation. This appearance is atypical for congestive heart and hence follow-up CT recommended by radiology.  S/P permanent pacemaker.  Dysphagia - Secondary to CVA. As per speech therapy follow-up, diet upgraded to dysphagia 2 and thin liquids.  Anemia in chronic kidney disease - Stable.   Discharge Instructions      Discharge Instructions    Ambulatory referral to Neurology    Complete by:  As directed   Pt will follow up with Dr. Roda Shutters at [facility] in about 2 months. Thanks.            Medication List    TAKE these medications        albuterol 108 (90 Base) MCG/ACT inhaler  Commonly known as:  PROVENTIL HFA;VENTOLIN HFA  Inhale 2 puffs into the lungs every 6 (six) hours as needed for wheezing or shortness of breath.     atorvastatin 80 MG tablet  Commonly known as:  LIPITOR  Take 1 tablet (80 mg total) by mouth every morning.     carvedilol 12.5 MG tablet  Commonly known as:  COREG  Take 1 tablet (12.5 mg total) by mouth 2 (two) times daily.     clopidogrel 75 MG tablet  Commonly known as:  PLAVIX  Take 1 tablet (75 mg total) by mouth daily. Discontinue when  INR > 2.0     docusate sodium 100 MG capsule  Commonly known as:  COLACE  Take 100 mg by mouth 2 (two) times daily.     furosemide 80 MG tablet  Commonly known as:  LASIX  Take 1 tablet (80 mg total) by mouth 2 (two) times daily.     hydrALAZINE 10 MG tablet  Commonly known as:  APRESOLINE  Take 1 tablet (10 mg total) by mouth 3 (three) times daily.     isosorbide mononitrate 30 MG 24 hr tablet  Commonly known as:  IMDUR  Take 30 mg by mouth daily.     JANUVIA 25 MG tablet  Generic drug:  sitaGLIPtin  Take 25 mg by mouth daily.     loratadine 10 MG tablet  Commonly known as:  CLARITIN  Take 10 mg by mouth daily as needed for allergies.     Melatonin 3 MG Tabs  Take 3 mg by mouth at bedtime as needed (for sleep).     nitroGLYCERIN 0.4 MG SL tablet  Commonly known as:  NITROSTAT  Place 1 tablet (0.4 mg total) under the tongue every 5 (five) minutes x 3 doses as needed for chest pain.     paricalcitol 1 MCG capsule  Commonly known as:  ZEMPLAR  Take 1 mcg by mouth daily.     potassium chloride SA 20 MEQ tablet  Commonly known as:  K-DUR,KLOR-CON  Take 1 tablet (20 mEq total) by mouth once.     RENA-VITE RX 1 MG Tabs  Take 1 tablet by mouth every morning.     Vitamin D (Ergocalciferol) 50000 units Caps capsule  Commonly known as:  DRISDOL  Take 1 capsule (50,000 Units total) by mouth every 7 (seven) days.     warfarin 4 MG tablet  Commonly known as:  COUMADIN  Take 1 tablet (4 mg total) by mouth daily at 6 PM.       Follow-up Information    Follow up with Xu,Jindong, MD. Schedule an appointment as soon as possible for a visit in 2 months.   Specialty:  Neurology   Why:  stroke clinic   Contact information:   [address] [place] [address] [phone]      No Known Allergies  Consultations:  Neurology  Nephrology  PM&R   Procedures/Studies: Ct Angio Head W Or Wo Contrast  06/29/2016  CLINICAL DATA:  82 year old female with  right-sided weakness and facial droop. EXAM: CT ANGIOGRAPHY HEAD AND NECK TECHNIQUE: Multidetector CT imaging of the head and neck was performed using the standard protocol during bolus administration of intravenous contrast. Multiplanar CT image reconstructions and MIPs were obtained to evaluate the vascular anatomy. Carotid stenosis measurements (when applicable) are obtained utilizing NASCET criteria, using the distal internal carotid diameter as the denominator. CONTRAST:  50 cc Isovue 370. This decision was made by the neurologist based on risk benefit ratio and the fact the patient cannot have an MR scan because of pacemaker. COMPARISON:  06/29/2016 head CT. FINDINGS: CT HEAD Brain: Small vessel disease changes without CT evidence of large acute infarct. No intracranial mass or abnormal enhancement. Global mild atrophy without hydrocephalus. Calvarium and skull base: Negative. Paranasal sinuses: Minimal left maxillary sinus mucosal thickening. Orbits: No acute abnormality. CTA NECK Aortic arch: Calcified plaque without high-grade stenosis. Three vessel arch. Right carotid system: Plaque proximal right internal carotid artery with greater than 75% diameter stenosis. Left carotid system: Ectatic left carotid artery with bifurcation posterior to the pharynx. No hemodynamically significant stenosis of the left internal carotid artery. Vertebral arteries:Mild narrowing origin of vertebral arteries bilaterally. Left vertebral artery dominant in size. Skeleton: Multilevel cervical spondylotic changes with various degrees of spinal stenosis and foraminal narrowing. Limbus vertebra C7/protrusion with marked canal narrowing. Other neck: Bilateral pleural effusions greater on right. Lung parenchymal changes partially reflective of pulmonary vascular congestion however, ground-glass opacities throughout the right lung apex including 1.4 cm rounded consolidation. This appearance is atypical for congestive heart. Recommend  follow-up chest CT in 3 months after treatment of any congestive heart failure or infectious infiltrate. CTA HEAD Anterior circulation: Prominent calcification cavernous segment internal carotid artery bilaterally with moderate to marked narrowing. Mild narrowing left middle cerebral artery M1 segment. Moderate narrowing right middle cerebral artery M1 segment. Moderate to marked narrowing A1 segment right anterior cerebral artery. Posterior circulation: Left vertebral artery is dominant. Moderate to marked narrowing vertebral arteries at the level of the foramen magnum. Mild narrowing and irregularity basilar artery. Moderate narrowing proximal mid and distal aspect posterior cerebral artery bilaterally. Venous sinuses: Patent. Anatomic variants: Negative. Delayed phase: Negative IMPRESSION: CT Small vessel disease changes without CT evidence of large acute infarct. No intracranial mass or abnormal enhancement. Global mild atrophy without hydrocephalus. CTA NECK Aortic atherosclerosis. Plaque proximal right internal carotid artery with greater than 75% diameter stenosis. Ectatic left carotid artery with bifurcation posterior to the pharynx. No hemodynamically  significant stenosis of the left internal carotid artery. Mild narrowing origin of vertebral arteries bilaterally. Left vertebral artery dominant in size. Multilevel cervical spondylotic changes with various degrees of spinal stenosis and foraminal narrowing. Limbus vertebra C7/protrusion with marked canal narrowing. Bilateral pleural effusions greater on right. Lung parenchymal changes partially reflective of pulmonary vascular congestion however, ground-glass opacities throughout the right lung apex including 1.4 cm rounded consolidation. This appearance is atypical for congestive heart. Recommend follow-up chest CT in 3 months after treatment of any congestive heart failure or infectious infiltrate. CTA HEAD Prominent calcification cavernous segment  internal carotid artery bilaterally with moderate to marked narrowing. Mild narrowing left middle cerebral artery M1 segment. Moderate narrowing right middle cerebral artery M1 segment. Moderate to marked narrowing A1 segment right anterior cerebral artery. Left vertebral artery is dominant. Moderate to marked narrowing vertebral arteries at the level of the foramen magnum. Mild narrowing and irregularity basilar artery. Moderate narrowing proximal mid and distal aspect posterior cerebral artery bilaterally. These results were called by telephone at the time of interpretation on 06/29/2016 at 5:43 pm to Dr. Derwood Kaplan , who verbally acknowledged these results. Electronically Signed   By: Lacy Duverney M.D.   On: 06/29/2016 18:31   Ct Head Wo Contrast  07/01/2016  CLINICAL DATA:  82 year old hypertensive female with right-sided weakness and facial droop. Subsequent encounter. EXAM: CT HEAD WITHOUT CONTRAST TECHNIQUE: Contiguous axial images were obtained from the base of the skull through the vertex without intravenous contrast. COMPARISON:  06/29/2016 CT angiogram and head CT. FINDINGS: No intracranial hemorrhage. Prominent chronic microvascular changes without CT evidence of large acute infarct. Tiny calcification left frontal lobe unchanged of indeterminate etiology. Prominent peri vascular space versus remote infarct right basal ganglia. Moderate global atrophy without hydrocephalus. No intracranial mass lesion noted on this unenhanced exam. Vascular calcifications. Exophthalmos. Minimal mucosal thickening left maxillary sinus. IMPRESSION: No intracranial hemorrhage or CT evidence of large acute infarct. Prominent chronic microvascular changes. Atrophy. Vascular calcifications. Exophthalmos. Electronically Signed   By: Lacy Duverney M.D.   On: 07/01/2016 08:02   Ct Angio Neck W Or Wo Contrast  06/29/2016  CLINICAL DATA:  82 year old female with right-sided weakness and facial droop. EXAM: CT ANGIOGRAPHY  HEAD AND NECK TECHNIQUE: Multidetector CT imaging of the head and neck was performed using the standard protocol during bolus administration of intravenous contrast. Multiplanar CT image reconstructions and MIPs were obtained to evaluate the vascular anatomy. Carotid stenosis measurements (when applicable) are obtained utilizing NASCET criteria, using the distal internal carotid diameter as the denominator. CONTRAST:  50 cc Isovue 370. This decision was made by the neurologist based on risk benefit ratio and the fact the patient cannot have an MR scan because of pacemaker. COMPARISON:  06/29/2016 head CT. FINDINGS: CT HEAD Brain: Small vessel disease changes without CT evidence of large acute infarct. No intracranial mass or abnormal enhancement. Global mild atrophy without hydrocephalus. Calvarium and skull base: Negative. Paranasal sinuses: Minimal left maxillary sinus mucosal thickening. Orbits: No acute abnormality. CTA NECK Aortic arch: Calcified plaque without high-grade stenosis. Three vessel arch. Right carotid system: Plaque proximal right internal carotid artery with greater than 75% diameter stenosis. Left carotid system: Ectatic left carotid artery with bifurcation posterior to the pharynx. No hemodynamically significant stenosis of the left internal carotid artery. Vertebral arteries:Mild narrowing origin of vertebral arteries bilaterally. Left vertebral artery dominant in size. Skeleton: Multilevel cervical spondylotic changes with various degrees of spinal stenosis and foraminal narrowing. Limbus vertebra C7/protrusion with marked canal  narrowing. Other neck: Bilateral pleural effusions greater on right. Lung parenchymal changes partially reflective of pulmonary vascular congestion however, ground-glass opacities throughout the right lung apex including 1.4 cm rounded consolidation. This appearance is atypical for congestive heart. Recommend follow-up chest CT in 3 months after treatment of any  congestive heart failure or infectious infiltrate. CTA HEAD Anterior circulation: Prominent calcification cavernous segment internal carotid artery bilaterally with moderate to marked narrowing. Mild narrowing left middle cerebral artery M1 segment. Moderate narrowing right middle cerebral artery M1 segment. Moderate to marked narrowing A1 segment right anterior cerebral artery. Posterior circulation: Left vertebral artery is dominant. Moderate to marked narrowing vertebral arteries at the level of the foramen magnum. Mild narrowing and irregularity basilar artery. Moderate narrowing proximal mid and distal aspect posterior cerebral artery bilaterally. Venous sinuses: Patent. Anatomic variants: Negative. Delayed phase: Negative IMPRESSION: CT Small vessel disease changes without CT evidence of large acute infarct. No intracranial mass or abnormal enhancement. Global mild atrophy without hydrocephalus. CTA NECK Aortic atherosclerosis. Plaque proximal right internal carotid artery with greater than 75% diameter stenosis. Ectatic left carotid artery with bifurcation posterior to the pharynx. No hemodynamically significant stenosis of the left internal carotid artery. Mild narrowing origin of vertebral arteries bilaterally. Left vertebral artery dominant in size. Multilevel cervical spondylotic changes with various degrees of spinal stenosis and foraminal narrowing. Limbus vertebra C7/protrusion with marked canal narrowing. Bilateral pleural effusions greater on right. Lung parenchymal changes partially reflective of pulmonary vascular congestion however, ground-glass opacities throughout the right lung apex including 1.4 cm rounded consolidation. This appearance is atypical for congestive heart. Recommend follow-up chest CT in 3 months after treatment of any congestive heart failure or infectious infiltrate. CTA HEAD Prominent calcification cavernous segment internal carotid artery bilaterally with moderate to marked  narrowing. Mild narrowing left middle cerebral artery M1 segment. Moderate narrowing right middle cerebral artery M1 segment. Moderate to marked narrowing A1 segment right anterior cerebral artery. Left vertebral artery is dominant. Moderate to marked narrowing vertebral arteries at the level of the foramen magnum. Mild narrowing and irregularity basilar artery. Moderate narrowing proximal mid and distal aspect posterior cerebral artery bilaterally. These results were called by telephone at the time of interpretation on 06/29/2016 at 5:43 pm to Dr. Derwood KaplanANKIT NANAVATI , who verbally acknowledged these results. Electronically Signed   By: Lacy DuverneySteven  Olson M.D.   On: 06/29/2016 18:31   Dg Chest Port 1 View  06/29/2016  CLINICAL DATA:  Right-sided weakness and aphasia EXAM: PORTABLE CHEST 1 VIEW COMPARISON:  07/05/2015 FINDINGS: Pacing device is again identified and stable. Cardiomegaly is again noted. Aortic calcifications are seen and stable. The lungs are well aerated bilaterally. Mild congestive failure is again noted. No focal infiltrate is seen. IMPRESSION: Mild CHF. Aortic atherosclerosis. Electronically Signed   By: Alcide CleverMark  Lukens M.D.   On: 06/29/2016 18:30   Ct Head Code Stroke W/o Cm  06/29/2016  CLINICAL DATA:  Code stroke.  Right-sided weakness and facial droop. EXAM: CT HEAD WITHOUT CONTRAST TECHNIQUE: Contiguous axial images were obtained from the base of the skull through the vertex without intravenous contrast. COMPARISON:  05/03/2005 FINDINGS: Brain: No evidence of cortical infarction, hemorrhage, hydrocephalus, or mass lesion/mass effect. Patchy white matter ischemic gliosis that has progressed since previous, overall mild for age. There have been lacunar infarcts in the bilateral thalamus. Dilated perivascular space below the right putamen. Aspects is 10. Vascular: No hyperdense vessel. Calcified intracranial atherosclerosis Skull: Negative for fracture or focal lesion. Sinuses/Orbits: No acute finding.  Other:  None. Attempt to call at the time of interpretation on 06/29/2016 at 5:29 and 5:34 pm to Dr. Nicholas Lose, but cell phone not working [phone]). These results were called by telephone at the time of interpretation on 06/29/2016 at 5:36 pm to Dr. Tyrone Apple , who verbally acknowledged these results. IMPRESSION: 1. No intracranial hemorrhage or definite acute infarct. 2. Chronic microvascular disease and mild for age atrophy. Electronically Signed   By: Marnee Spring M.D.   On: 06/29/2016 17:37        Discharge Exam: Filed Vitals:   07/04/16 0610 07/04/16 0800  BP: 128/48 129/60  Pulse: 70 60  Temp: 98.7 F (37.1 C) 98.2 F (36.8 C)  Resp: 16 16   Filed Vitals:   07/03/16 2041 07/04/16 0058 07/04/16 0610 07/04/16 0800  BP: 131/61 130/47 128/48 129/60  Pulse:  64 70 60  Temp:  98.6 F (37 C) 98.7 F (37.1 C) 98.2 F (36.8 C)  TempSrc:  Oral Oral Oral  Resp:  16 16 16   Height:      Weight:      SpO2:  96% 96% 96%    General: Pt is alert, awake, not in acute distress Cardiovascular: RRR, S1/S2 +, no rubs, no gallops Respiratory: CTA bilaterally, no wheezing, no rhonchi Abdominal: Soft, NT, ND, bowel sounds + Extremities: no edema, no cyanosis   The results of significant diagnostics from this hospitalization (including imaging, microbiology, ancillary and laboratory) are listed below for reference.    Significant Diagnostic Studies: Ct Angio Head W Or Wo Contrast  06/29/2016  CLINICAL DATA:  82 year old female with right-sided weakness and facial droop. EXAM: CT ANGIOGRAPHY HEAD AND NECK TECHNIQUE: Multidetector CT imaging of the head and neck was performed using the standard protocol during bolus administration of intravenous contrast. Multiplanar CT image reconstructions and MIPs were obtained to evaluate the vascular anatomy. Carotid stenosis measurements (when applicable) are obtained utilizing NASCET criteria, using the distal internal carotid diameter as the  denominator. CONTRAST:  50 cc Isovue 370. This decision was made by the neurologist based on risk benefit ratio and the fact the patient cannot have an MR scan because of pacemaker. COMPARISON:  06/29/2016 head CT. FINDINGS: CT HEAD Brain: Small vessel disease changes without CT evidence of large acute infarct. No intracranial mass or abnormal enhancement. Global mild atrophy without hydrocephalus. Calvarium and skull base: Negative. Paranasal sinuses: Minimal left maxillary sinus mucosal thickening. Orbits: No acute abnormality. CTA NECK Aortic arch: Calcified plaque without high-grade stenosis. Three vessel arch. Right carotid system: Plaque proximal right internal carotid artery with greater than 75% diameter stenosis. Left carotid system: Ectatic left carotid artery with bifurcation posterior to the pharynx. No hemodynamically significant stenosis of the left internal carotid artery. Vertebral arteries:Mild narrowing origin of vertebral arteries bilaterally. Left vertebral artery dominant in size. Skeleton: Multilevel cervical spondylotic changes with various degrees of spinal stenosis and foraminal narrowing. Limbus vertebra C7/protrusion with marked canal narrowing. Other neck: Bilateral pleural effusions greater on right. Lung parenchymal changes partially reflective of pulmonary vascular congestion however, ground-glass opacities throughout the right lung apex including 1.4 cm rounded consolidation. This appearance is atypical for congestive heart. Recommend follow-up chest CT in 3 months after treatment of any congestive heart failure or infectious infiltrate. CTA HEAD Anterior circulation: Prominent calcification cavernous segment internal carotid artery bilaterally with moderate to marked narrowing. Mild narrowing left middle cerebral artery M1 segment. Moderate narrowing right middle cerebral artery M1 segment. Moderate to marked narrowing A1 segment right anterior cerebral  artery. Posterior circulation:  Left vertebral artery is dominant. Moderate to marked narrowing vertebral arteries at the level of the foramen magnum. Mild narrowing and irregularity basilar artery. Moderate narrowing proximal mid and distal aspect posterior cerebral artery bilaterally. Venous sinuses: Patent. Anatomic variants: Negative. Delayed phase: Negative IMPRESSION: CT Small vessel disease changes without CT evidence of large acute infarct. No intracranial mass or abnormal enhancement. Global mild atrophy without hydrocephalus. CTA NECK Aortic atherosclerosis. Plaque proximal right internal carotid artery with greater than 75% diameter stenosis. Ectatic left carotid artery with bifurcation posterior to the pharynx. No hemodynamically significant stenosis of the left internal carotid artery. Mild narrowing origin of vertebral arteries bilaterally. Left vertebral artery dominant in size. Multilevel cervical spondylotic changes with various degrees of spinal stenosis and foraminal narrowing. Limbus vertebra C7/protrusion with marked canal narrowing. Bilateral pleural effusions greater on right. Lung parenchymal changes partially reflective of pulmonary vascular congestion however, ground-glass opacities throughout the right lung apex including 1.4 cm rounded consolidation. This appearance is atypical for congestive heart. Recommend follow-up chest CT in 3 months after treatment of any congestive heart failure or infectious infiltrate. CTA HEAD Prominent calcification cavernous segment internal carotid artery bilaterally with moderate to marked narrowing. Mild narrowing left middle cerebral artery M1 segment. Moderate narrowing right middle cerebral artery M1 segment. Moderate to marked narrowing A1 segment right anterior cerebral artery. Left vertebral artery is dominant. Moderate to marked narrowing vertebral arteries at the level of the foramen magnum. Mild narrowing and irregularity basilar artery. Moderate narrowing proximal mid and  distal aspect posterior cerebral artery bilaterally. These results were called by telephone at the time of interpretation on 06/29/2016 at 5:43 pm to Dr. Derwood Kaplan , who verbally acknowledged these results. Electronically Signed   By: Lacy Duverney M.D.   On: 06/29/2016 18:31   Ct Head Wo Contrast  07/01/2016  CLINICAL DATA:  83 year old hypertensive female with right-sided weakness and facial droop. Subsequent encounter. EXAM: CT HEAD WITHOUT CONTRAST TECHNIQUE: Contiguous axial images were obtained from the base of the skull through the vertex without intravenous contrast. COMPARISON:  06/29/2016 CT angiogram and head CT. FINDINGS: No intracranial hemorrhage. Prominent chronic microvascular changes without CT evidence of large acute infarct. Tiny calcification left frontal lobe unchanged of indeterminate etiology. Prominent peri vascular space versus remote infarct right basal ganglia. Moderate global atrophy without hydrocephalus. No intracranial mass lesion noted on this unenhanced exam. Vascular calcifications. Exophthalmos. Minimal mucosal thickening left maxillary sinus. IMPRESSION: No intracranial hemorrhage or CT evidence of large acute infarct. Prominent chronic microvascular changes. Atrophy. Vascular calcifications. Exophthalmos. Electronically Signed   By: Lacy Duverney M.D.   On: 07/01/2016 08:02   Ct Angio Neck W Or Wo Contrast  06/29/2016  CLINICAL DATA:  82 year old female with right-sided weakness and facial droop. EXAM: CT ANGIOGRAPHY HEAD AND NECK TECHNIQUE: Multidetector CT imaging of the head and neck was performed using the standard protocol during bolus administration of intravenous contrast. Multiplanar CT image reconstructions and MIPs were obtained to evaluate the vascular anatomy. Carotid stenosis measurements (when applicable) are obtained utilizing NASCET criteria, using the distal internal carotid diameter as the denominator. CONTRAST:  50 cc Isovue 370. This decision was made  by the neurologist based on risk benefit ratio and the fact the patient cannot have an MR scan because of pacemaker. COMPARISON:  06/29/2016 head CT. FINDINGS: CT HEAD Brain: Small vessel disease changes without CT evidence of large acute infarct. No intracranial mass or abnormal enhancement. Global mild atrophy without hydrocephalus. Calvarium  and skull base: Negative. Paranasal sinuses: Minimal left maxillary sinus mucosal thickening. Orbits: No acute abnormality. CTA NECK Aortic arch: Calcified plaque without high-grade stenosis. Three vessel arch. Right carotid system: Plaque proximal right internal carotid artery with greater than 75% diameter stenosis. Left carotid system: Ectatic left carotid artery with bifurcation posterior to the pharynx. No hemodynamically significant stenosis of the left internal carotid artery. Vertebral arteries:Mild narrowing origin of vertebral arteries bilaterally. Left vertebral artery dominant in size. Skeleton: Multilevel cervical spondylotic changes with various degrees of spinal stenosis and foraminal narrowing. Limbus vertebra C7/protrusion with marked canal narrowing. Other neck: Bilateral pleural effusions greater on right. Lung parenchymal changes partially reflective of pulmonary vascular congestion however, ground-glass opacities throughout the right lung apex including 1.4 cm rounded consolidation. This appearance is atypical for congestive heart. Recommend follow-up chest CT in 3 months after treatment of any congestive heart failure or infectious infiltrate. CTA HEAD Anterior circulation: Prominent calcification cavernous segment internal carotid artery bilaterally with moderate to marked narrowing. Mild narrowing left middle cerebral artery M1 segment. Moderate narrowing right middle cerebral artery M1 segment. Moderate to marked narrowing A1 segment right anterior cerebral artery. Posterior circulation: Left vertebral artery is dominant. Moderate to marked narrowing  vertebral arteries at the level of the foramen magnum. Mild narrowing and irregularity basilar artery. Moderate narrowing proximal mid and distal aspect posterior cerebral artery bilaterally. Venous sinuses: Patent. Anatomic variants: Negative. Delayed phase: Negative IMPRESSION: CT Small vessel disease changes without CT evidence of large acute infarct. No intracranial mass or abnormal enhancement. Global mild atrophy without hydrocephalus. CTA NECK Aortic atherosclerosis. Plaque proximal right internal carotid artery with greater than 75% diameter stenosis. Ectatic left carotid artery with bifurcation posterior to the pharynx. No hemodynamically significant stenosis of the left internal carotid artery. Mild narrowing origin of vertebral arteries bilaterally. Left vertebral artery dominant in size. Multilevel cervical spondylotic changes with various degrees of spinal stenosis and foraminal narrowing. Limbus vertebra C7/protrusion with marked canal narrowing. Bilateral pleural effusions greater on right. Lung parenchymal changes partially reflective of pulmonary vascular congestion however, ground-glass opacities throughout the right lung apex including 1.4 cm rounded consolidation. This appearance is atypical for congestive heart. Recommend follow-up chest CT in 3 months after treatment of any congestive heart failure or infectious infiltrate. CTA HEAD Prominent calcification cavernous segment internal carotid artery bilaterally with moderate to marked narrowing. Mild narrowing left middle cerebral artery M1 segment. Moderate narrowing right middle cerebral artery M1 segment. Moderate to marked narrowing A1 segment right anterior cerebral artery. Left vertebral artery is dominant. Moderate to marked narrowing vertebral arteries at the level of the foramen magnum. Mild narrowing and irregularity basilar artery. Moderate narrowing proximal mid and distal aspect posterior cerebral artery bilaterally. These results  were called by telephone at the time of interpretation on 06/29/2016 at 5:43 pm to Dr. Derwood Kaplan , who verbally acknowledged these results. Electronically Signed   By: Lacy Duverney M.D.   On: 06/29/2016 18:31   Dg Chest Port 1 View  06/29/2016  CLINICAL DATA:  Right-sided weakness and aphasia EXAM: PORTABLE CHEST 1 VIEW COMPARISON:  07/05/2015 FINDINGS: Pacing device is again identified and stable. Cardiomegaly is again noted. Aortic calcifications are seen and stable. The lungs are well aerated bilaterally. Mild congestive failure is again noted. No focal infiltrate is seen. IMPRESSION: Mild CHF. Aortic atherosclerosis. Electronically Signed   By: Alcide Clever M.D.   On: 06/29/2016 18:30   Ct Head Code Stroke W/o Cm  06/29/2016  CLINICAL DATA:  Code stroke.  Right-sided weakness and facial droop. EXAM: CT HEAD WITHOUT CONTRAST TECHNIQUE: Contiguous axial images were obtained from the base of the skull through the vertex without intravenous contrast. COMPARISON:  05/03/2005 FINDINGS: Brain: No evidence of cortical infarction, hemorrhage, hydrocephalus, or mass lesion/mass effect. Patchy white matter ischemic gliosis that has progressed since previous, overall mild for age. There have been lacunar infarcts in the bilateral thalamus. Dilated perivascular space below the right putamen. Aspects is 10. Vascular: No hyperdense vessel. Calcified intracranial atherosclerosis Skull: Negative for fracture or focal lesion. Sinuses/Orbits: No acute finding. Other: None. Attempt to call at the time of interpretation on 06/29/2016 at 5:29 and 5:34 pm to Dr. Nicholas LoseEshraghi, but cell phone not working [phone]). These results were called by telephone at the time of interpretation on 06/29/2016 at 5:36 pm to Dr. Tyrone AppleEMILY NGUYEN , who verbally acknowledged these results. IMPRESSION: 1. No intracranial hemorrhage or definite acute infarct. 2. Chronic microvascular disease and mild for age atrophy. Electronically Signed   By:  Marnee SpringJonathon  Watts M.D.   On: 06/29/2016 17:37     Microbiology: No results found for this or any previous visit (from the past 240 hour(s)).   Labs: Basic Metabolic Panel:  Recent Labs Lab 06/30/16 0731 07/01/16 0346 07/02/16 0306 07/03/16 0634 07/04/16 0205  NA 142 140 143 138 138  K 4.0 3.8 3.8 3.6 3.4*  CL 100* 101 93* 96* 96*  CO2 29 29 30 29 28   GLUCOSE 80 73 75 73 136*  BUN 64* 64* 64* 65* 70*  CREATININE 5.29* 4.93* 4.96* 4.97* 4.94*  CALCIUM 10.2 10.0 11.0* 10.2 10.2  MG 2.6*  --   --   --   --    Liver Function Tests:  Recent Labs Lab 06/29/16 1711  AST 26  ALT 18  ALKPHOS 56  BILITOT 1.0  PROT 7.2  ALBUMIN 3.8   No results for input(s): LIPASE, AMYLASE in the last 168 hours. No results for input(s): AMMONIA in the last 168 hours. CBC:  Recent Labs Lab 06/29/16 1711 06/29/16 1712 06/30/16 0731 07/01/16 0346 07/02/16 0306  WBC 5.7  --  6.2 7.0 7.5  NEUTROABS 4.1  --  4.5 5.9 4.8  HGB 10.0* 11.2* 10.0* 9.8* 9.5*  HCT 32.0* 33.0* 33.5* 32.9* 31.4*  MCV 98.2  --  98.8 99.4 99.7  PLT 217  --  212 187 186   Cardiac Enzymes: No results for input(s): CKTOTAL, CKMB, CKMBINDEX, TROPONINI in the last 168 hours. BNP: Invalid input(s): POCBNP CBG:  Recent Labs Lab 07/03/16 1127 07/03/16 1638 07/03/16 2147 07/04/16 0604 07/04/16 1106  GLUCAP 192* 235* 214* 133* 194*    Time coordinating discharge:  Greater than 30 minutes  Signed:  Chucky Homes, DO [facility] Pager: [phone] 07/04/2016, 1:32 PM

## 2016-07-05 ENCOUNTER — Encounter: Payer: Self-pay | Admitting: Internal Medicine

## 2016-07-05 ENCOUNTER — Non-Acute Institutional Stay (SKILLED_NURSING_FACILITY): Payer: Medicare Other | Admitting: Internal Medicine

## 2016-07-05 DIAGNOSIS — I69391 Dysphagia following cerebral infarction: Secondary | ICD-10-CM | POA: Diagnosis not present

## 2016-07-05 DIAGNOSIS — N185 Chronic kidney disease, stage 5: Secondary | ICD-10-CM

## 2016-07-05 DIAGNOSIS — I480 Paroxysmal atrial fibrillation: Secondary | ICD-10-CM | POA: Diagnosis not present

## 2016-07-05 DIAGNOSIS — I5042 Chronic combined systolic (congestive) and diastolic (congestive) heart failure: Secondary | ICD-10-CM

## 2016-07-05 DIAGNOSIS — Z9861 Coronary angioplasty status: Secondary | ICD-10-CM

## 2016-07-05 DIAGNOSIS — E876 Hypokalemia: Secondary | ICD-10-CM | POA: Diagnosis not present

## 2016-07-05 DIAGNOSIS — K5900 Constipation, unspecified: Secondary | ICD-10-CM

## 2016-07-05 DIAGNOSIS — E1121 Type 2 diabetes mellitus with diabetic nephropathy: Secondary | ICD-10-CM | POA: Diagnosis not present

## 2016-07-05 DIAGNOSIS — I2510 Atherosclerotic heart disease of native coronary artery without angina pectoris: Secondary | ICD-10-CM | POA: Diagnosis not present

## 2016-07-05 DIAGNOSIS — N189 Chronic kidney disease, unspecified: Secondary | ICD-10-CM | POA: Diagnosis not present

## 2016-07-05 DIAGNOSIS — J45909 Unspecified asthma, uncomplicated: Secondary | ICD-10-CM

## 2016-07-05 DIAGNOSIS — G4700 Insomnia, unspecified: Secondary | ICD-10-CM

## 2016-07-05 DIAGNOSIS — Z7901 Long term (current) use of anticoagulants: Secondary | ICD-10-CM

## 2016-07-05 DIAGNOSIS — R5381 Other malaise: Secondary | ICD-10-CM

## 2016-07-05 DIAGNOSIS — I639 Cerebral infarction, unspecified: Secondary | ICD-10-CM

## 2016-07-05 DIAGNOSIS — R471 Dysarthria and anarthria: Secondary | ICD-10-CM

## 2016-07-05 DIAGNOSIS — D631 Anemia in chronic kidney disease: Secondary | ICD-10-CM

## 2016-07-05 NOTE — Telephone Encounter (Signed)
Please continue furosemide at the higher dose. Please find out who will be managing the patient's Coumadin.

## 2016-07-05 NOTE — Telephone Encounter (Signed)
Yes, we should monitor/manage Coumadin therapy.

## 2016-07-05 NOTE — Progress Notes (Signed)
LOCATION: [facility]  PCP: Eliott NineLEAGUE-SOBON, JENNIFER, MD   Code Status: Full Code  Goals of care: Advanced Directive information Advanced Directives 06/29/2016  Does patient have an advance directive? Yes  Type of Advance Directive Healthcare Power of Attorney  Copy of advanced directive(s) in chart? -  Would patient like information on creating an advanced directive? -       Extended Emergency Contact Information Primary Emergency Contact: McCoy,Nancy Address: [address]          [place], [place] [address] [place] Home Phone: [phone] Mobile Phone: [phone] Relation: Daughter Secondary Emergency Contact: Herendeen,Chris Address: [address]          [place], [place] [address] [place] Home Phone: [phone] Mobile Phone: [phone] Relation: Son   No Known Allergies  Chief Complaint  Patient presents with  . New Admit To SNF    New Admission     HPI:  Patient is a 82 y.o. female seen today for short term rehabilitation post hospital admission from 06/29/16-07/04/16 acute CVA with right hemiparesis, dysarthria, dysphagia and facial asymmetry. She was seen by neurology. She was noted to have afib on interrogation of her pacemaker. She was started on coumadin with cardiology consult and is also on plavix until inr gets therapeutic. She had acute respiratory failure with chf exacerbation in hospital and required diuresis. She had AKI on ckd stage 5. She is seen in her room today.   Review of Systems:  Constitutional: Negative for fever, chills, diaphoresis. Energy level is slowly coming back.  HENT: Negative for headache, congestion, nasal discharge, hearing loss, sore throat, difficulty swallowing.   Eyes: Negative for blurred vision, double vision and discharge.  Respiratory: Negative for cough, shortness of breath and wheezing.   Cardiovascular: Negative for chest pain, palpitations, leg swelling.  Gastrointestinal: Negative for  heartburn, nausea, vomiting, abdominal pain. Last bowel movement was last night.  Genitourinary: Negative for dysuria and flank pain.  Musculoskeletal: Negative for back pain, fall in the facility.  Skin: Negative for itching, rash.  Neurological: Negative for dizziness. Psychiatric/Behavioral: Negative for depression   Past Medical History  Diagnosis Date  . Hyperlipidemia   . Carotid stenosis     Carotid US (12/2013): < 60% RCA, < 40% LICA.  [name]. Hypertension   . Coronary artery disease     a. s/p MI 1995 tx w/ PTCA to Dx, Ramus, ap LAD;  b. LHC (11/2004):  Inferior HK, EF 60%, proximal LAD 50%, ostial D1 80%, mid D1 95%, mid LAD 50-60%, distal LAD at the apex 90%, OM1 occluded (fills late by left to left collaterals), RCA 75%. Medical therapy recommended.;  c. Lexiscan Myoview (12/2013):  Prior ant-lat scar with very small area of peri-infarct ischemia in ant wall, EF 40 (high risk)   . Obesity (BMI 30-39.9)   . Intrinsic asthma 01/28/2008     Mild intermittant asthma, PFT's 01/28/08     . Old anterolateral myocardial infarction   . PVD (peripheral vascular disease) (HCC) 02/16/2014    2015, right femoral peroneal bypass grafting and left femoral-tibial bypass grafting by Dr. Arbie CookeyEarly for rest pain as well as nonhealing ulcer on toe accompanied with toe amputation  Arteriogram October 2015 with patent graft on the right, 70% stenosis of graft on the left proximally.   . Chronic combined systolic and diastolic heart failure (HCC) 04/13/2014  . Complete heart block (HCC)     a. [facility]2015 [facility] [employer] DR model [id number] dual chamber PPM (serial number [id number]) (Dr. Johney Frame).  [name] Hx of echocardiogram     a. Echo 5/16: dist septal, dist ant, dist inf and apical AK, EF 40-45%, mod MR, mild LAE, PASP 72  . Presence of permanent cardiac pacemaker   . Chronic kidney disease (CKD), stage V (HCC)     notes 07/03/2015; Dr. Hyman Hopes  . Anemia due to pre-end-stage renal disease treated with erythropoietin  05/08/2015  . Type 2 diabetes mellitus with renal manifestations [facility])    Past Surgical History  Procedure Laterality Date  . Repair of nerve in left arm  Left 2000  . Cardiac catheterization  1995    w/ PTCA Diag 2nd MI  . Femoral-tibial bypass graft Left 02/24/2014    Procedure: BYPASS GRAFT FEMORAL-TIBIAL ARTERY;  Surgeon: Larina Earthly, MD;  Location: [facility] OR;  Service: Vascular;  Laterality: Left;  . Femoral-popliteal bypass graft Right 08/04/2014    Procedure: RIGHT FEMORAL-PERONEAL ARTERY BYPASS GRAFT;  Surgeon: Larina Earthly, MD;  Location: [facility] OR;  Service: Vascular;  Laterality: Right;  . Amputation Right 08/04/2014    Procedure: AMPUTATION DIGIT-RIGHT 3RD TOE;  Surgeon: Larina Earthly, MD;  Location: [facility] OR;  Service: Vascular;  Laterality: Right;  . Lower extremity angiogram Left 01/08/2014    Procedure: LOWER EXTREMITY ANGIOGRAM;  Surgeon: Sherren Kerns, MD;  Location: [facility] CATH LAB;  Service: Cardiovascular;  Laterality: Left;  . Abdominal angiogram  01/08/2014    Procedure: ABDOMINAL ANGIOGRAM;  Surgeon: Sherren Kerns, MD;  Location: [facility] CATH LAB;  Service: Cardiovascular;;  . Lower extremity angiogram Right 08/03/2014    Procedure: LOWER EXTREMITY ANGIOGRAM;  Surgeon: Nada Libman, MD;  Location: [facility] CATH LAB;  Service: Cardiovascular;  Laterality: Right;  . Abdominal aortagram N/A 10/25/2014    Procedure: ABDOMINAL Ronny Flurry;  Surgeon: Chuck Hint, MD;  Location: [facility] CATH LAB;  Service: Cardiovascular;  Laterality: N/A;  . Ep implantable device N/A 05/10/2015    Procedure: Pacemaker Implant;  Surgeon: Hillis Range, MD;  Location: [facility] INVASIVE CV LAB;  Service: Cardiovascular;  Laterality: N/A;  . Ep implantable device N/A 06/13/2015    Procedure: Lead Revision/Repair;  Surgeon: Duke Salvia, MD;  Location: [facility] INVASIVE CV LAB;  Service: Cardiovascular;  Laterality: N/A;  . Pacemaker lead removal  06/13/2015    Procedure: Pacemaker Lead Removal;  Surgeon: Duke Salvia, MD;   Location: [facility] INVASIVE CV LAB;  Service: Cardiovascular;;  . Insert / replace / remove pacemaker     Social History:   reports that she has never smoked. She has never used smokeless tobacco. She reports that she does not drink alcohol or use illicit drugs.  Family History  Problem Relation Age of Onset  . Diabetes Father   . Hypertension Father   . Hypertension Daughter   . Hyperlipidemia Daughter   . Hyperlipidemia Son   . Hypertension Sister     Medications:   Medication List       This list is accurate as of: 07/05/16 12:22 PM.  Always use your most recent med list.               albuterol 108 (90 Base) MCG/ACT inhaler  Commonly known as:  PROVENTIL HFA;VENTOLIN HFA  Inhale 2 puffs into the lungs every 6 (six) hours as needed for wheezing or shortness of breath.     atorvastatin 80 MG tablet  Commonly known as:  LIPITOR  Take 1 tablet (80  mg total) by mouth every morning.     carvedilol 12.5 MG tablet  Commonly known as:  COREG  Take 1 tablet (12.5 mg total) by mouth 2 (two) times daily.     clopidogrel 75 MG tablet  Commonly known as:  PLAVIX  Take 1 tablet (75 mg total) by mouth daily. Discontinue when INR > 2.0     docusate sodium 100 MG capsule  Commonly known as:  COLACE  Take 100 mg by mouth 2 (two) times daily.     furosemide 80 MG tablet  Commonly known as:  LASIX  Take 1 tablet (80 mg total) by mouth 2 (two) times daily.     hydrALAZINE 10 MG tablet  Commonly known as:  APRESOLINE  Take 1 tablet (10 mg total) by mouth 3 (three) times daily.     isosorbide mononitrate 30 MG 24 hr tablet  Commonly known as:  IMDUR  Take 30 mg by mouth daily.     JANUVIA 25 MG tablet  Generic drug:  sitaGLIPtin  Take 25 mg by mouth daily.     loratadine 10 MG tablet  Commonly known as:  CLARITIN  Take 10 mg by mouth daily as needed for allergies.     Melatonin 3 MG Tabs  Take 3 mg by mouth at bedtime as needed (for sleep).     nitroGLYCERIN 0.4 MG SL tablet   Commonly known as:  NITROSTAT  Place 1 tablet (0.4 mg total) under the tongue every 5 (five) minutes x 3 doses as needed for chest pain.     paricalcitol 1 MCG capsule  Commonly known as:  ZEMPLAR  Take 1 mcg by mouth daily.     potassium chloride SA 20 MEQ tablet  Commonly known as:  K-DUR,KLOR-CON  Take 1 tablet (20 mEq total) by mouth once.     RENA-VITE RX 1 MG Tabs  Take 1 tablet by mouth every morning.     Vitamin D (Ergocalciferol) 50000 units Caps capsule  Commonly known as:  DRISDOL  Take 1 capsule (50,000 Units total) by mouth every 7 (seven) days.     warfarin 4 MG tablet  Commonly known as:  COUMADIN  Take 1 tablet (4 mg total) by mouth daily at 6 PM.        Immunizations:  There is no immunization history on file for this patient.   Physical Exam: Filed Vitals:   07/05/16 1217  BP: 129/63  Pulse: 62  Temp: 96.3 F (35.7 C)  TempSrc: Oral  Resp: 16  Height:  (1.626 m)  Weight: 168 lb (76.204 kg)  SpO2: 98%   Body mass index is 28.82 kg/(m^2).  General- elderly female, overweight, in no acute distress Head- normocephalic, atraumatic Nose- no nasal discharge Throat- moist mucus membrane, dentures Eyes- PERRLA, EOMI, no pallor, no icterus Neck- no cervical lymphadenopathy Cardiovascular- normal s1,s2, no murmur Respiratory- bilateral clear to auscultation, no wheeze, no rhonchi, no crackles, no use of accessory muscles Abdomen- bowel sounds present, soft, non tender Musculoskeletal- able to move all 4 extremities, right sided weakness > left side, no leg edema Neurological- alert and oriented to person, place and time, has dysarthria, right facial droop Skin- warm and dry Psychiatry- normal mood and affect    Labs reviewed: Basic Metabolic Panel:  Recent Labs  16/10/96 0731  07/02/16 0306 07/03/16 0634 07/04/16 0205  NA 142  < > 143 138 138  K 4.0  < > 3.8 3.6 3.4*  CL 100*  < >  93* 96* 96*  CO2 29  < > 30 29 28   GLUCOSE 80  <  > 75 73 136*  BUN 64*  < > 64* 65* 70*  CREATININE 5.29*  < > 4.96* 4.97* 4.94*  CALCIUM 10.2  < > 11.0* 10.2 10.2  MG 2.6*  --   --   --   --   < > = values in this interval not displayed. Liver Function Tests:  Recent Labs  06/29/16 1711  AST 26  ALT 18  ALKPHOS 56  BILITOT 1.0  PROT 7.2  ALBUMIN 3.8   No results for input(s): LIPASE, AMYLASE in the last 8760 hours. No results for input(s): AMMONIA in the last 8760 hours. CBC:  Recent Labs  06/30/16 0731 07/01/16 0346 07/02/16 0306  WBC 6.2 7.0 7.5  NEUTROABS 4.5 5.9 4.8  HGB 10.0* 9.8* 9.5*  HCT 33.5* 32.9* 31.4*  MCV 98.8 99.4 99.7  PLT 212 187 186   Cardiac Enzymes: No results for input(s): CKTOTAL, CKMB, CKMBINDEX, TROPONINI in the last 8760 hours. BNP: Invalid input(s): POCBNP CBG:  Recent Labs  07/03/16 2147 07/04/16 0604 07/04/16 1106  GLUCAP 214* 133* 194*    Radiological Exams: Ct Angio Head W Or Wo Contrast  06/29/2016  CLINICAL DATA:  82 year old female with right-sided weakness and facial droop. EXAM: CT ANGIOGRAPHY HEAD AND NECK TECHNIQUE: Multidetector CT imaging of the head and neck was performed using the standard protocol during bolus administration of intravenous contrast. Multiplanar CT image reconstructions and MIPs were obtained to evaluate the vascular anatomy. Carotid stenosis measurements (when applicable) are obtained utilizing NASCET criteria, using the distal internal carotid diameter as the denominator. CONTRAST:  50 cc Isovue 370. This decision was made by the neurologist based on risk benefit ratio and the fact the patient cannot have an MR scan because of pacemaker. COMPARISON:  06/29/2016 head CT. FINDINGS: CT HEAD Brain: Small vessel disease changes without CT evidence of large acute infarct. No intracranial mass or abnormal enhancement. Global mild atrophy without hydrocephalus. Calvarium and skull base: Negative. Paranasal sinuses: Minimal left maxillary sinus mucosal thickening.  Orbits: No acute abnormality. CTA NECK Aortic arch: Calcified plaque without high-grade stenosis. Three vessel arch. Right carotid system: Plaque proximal right internal carotid artery with greater than 75% diameter stenosis. Left carotid system: Ectatic left carotid artery with bifurcation posterior to the pharynx. No hemodynamically significant stenosis of the left internal carotid artery. Vertebral arteries:Mild narrowing origin of vertebral arteries bilaterally. Left vertebral artery dominant in size. Skeleton: Multilevel cervical spondylotic changes with various degrees of spinal stenosis and foraminal narrowing. Limbus vertebra C7/protrusion with marked canal narrowing. Other neck: Bilateral pleural effusions greater on right. Lung parenchymal changes partially reflective of pulmonary vascular congestion however, ground-glass opacities throughout the right lung apex including 1.4 cm rounded consolidation. This appearance is atypical for congestive heart. Recommend follow-up chest CT in 3 months after treatment of any congestive heart failure or infectious infiltrate. CTA HEAD Anterior circulation: Prominent calcification cavernous segment internal carotid artery bilaterally with moderate to marked narrowing. Mild narrowing left middle cerebral artery M1 segment. Moderate narrowing right middle cerebral artery M1 segment. Moderate to marked narrowing A1 segment right anterior cerebral artery. Posterior circulation: Left vertebral artery is dominant. Moderate to marked narrowing vertebral arteries at the level of the foramen magnum. Mild narrowing and irregularity basilar artery. Moderate narrowing proximal mid and distal aspect posterior cerebral artery bilaterally. Venous sinuses: Patent. Anatomic variants: Negative. Delayed phase: Negative IMPRESSION: CT Small vessel disease  changes without CT evidence of large acute infarct. No intracranial mass or abnormal enhancement. Global mild atrophy without  hydrocephalus. CTA NECK Aortic atherosclerosis. Plaque proximal right internal carotid artery with greater than 75% diameter stenosis. Ectatic left carotid artery with bifurcation posterior to the pharynx. No hemodynamically significant stenosis of the left internal carotid artery. Mild narrowing origin of vertebral arteries bilaterally. Left vertebral artery dominant in size. Multilevel cervical spondylotic changes with various degrees of spinal stenosis and foraminal narrowing. Limbus vertebra C7/protrusion with marked canal narrowing. Bilateral pleural effusions greater on right. Lung parenchymal changes partially reflective of pulmonary vascular congestion however, ground-glass opacities throughout the right lung apex including 1.4 cm rounded consolidation. This appearance is atypical for congestive heart. Recommend follow-up chest CT in 3 months after treatment of any congestive heart failure or infectious infiltrate. CTA HEAD Prominent calcification cavernous segment internal carotid artery bilaterally with moderate to marked narrowing. Mild narrowing left middle cerebral artery M1 segment. Moderate narrowing right middle cerebral artery M1 segment. Moderate to marked narrowing A1 segment right anterior cerebral artery. Left vertebral artery is dominant. Moderate to marked narrowing vertebral arteries at the level of the foramen magnum. Mild narrowing and irregularity basilar artery. Moderate narrowing proximal mid and distal aspect posterior cerebral artery bilaterally. These results were called by telephone at the time of interpretation on 06/29/2016 at 5:43 pm to Dr. Derwood Kaplan , who verbally acknowledged these results. Electronically Signed   By: Lacy Duverney M.D.   On: 06/29/2016 18:31   Ct Head Wo Contrast  07/01/2016  CLINICAL DATA:  82 year old hypertensive female with right-sided weakness and facial droop. Subsequent encounter. EXAM: CT HEAD WITHOUT CONTRAST TECHNIQUE: Contiguous axial images  were obtained from the base of the skull through the vertex without intravenous contrast. COMPARISON:  06/29/2016 CT angiogram and head CT. FINDINGS: No intracranial hemorrhage. Prominent chronic microvascular changes without CT evidence of large acute infarct. Tiny calcification left frontal lobe unchanged of indeterminate etiology. Prominent peri vascular space versus remote infarct right basal ganglia. Moderate global atrophy without hydrocephalus. No intracranial mass lesion noted on this unenhanced exam. Vascular calcifications. Exophthalmos. Minimal mucosal thickening left maxillary sinus. IMPRESSION: No intracranial hemorrhage or CT evidence of large acute infarct. Prominent chronic microvascular changes. Atrophy. Vascular calcifications. Exophthalmos. Electronically Signed   By: Lacy Duverney M.D.   On: 07/01/2016 08:02   Ct Angio Neck W Or Wo Contrast  06/29/2016  CLINICAL DATA:  82 year old female with right-sided weakness and facial droop. EXAM: CT ANGIOGRAPHY HEAD AND NECK TECHNIQUE: Multidetector CT imaging of the head and neck was performed using the standard protocol during bolus administration of intravenous contrast. Multiplanar CT image reconstructions and MIPs were obtained to evaluate the vascular anatomy. Carotid stenosis measurements (when applicable) are obtained utilizing NASCET criteria, using the distal internal carotid diameter as the denominator. CONTRAST:  50 cc Isovue 370. This decision was made by the neurologist based on risk benefit ratio and the fact the patient cannot have an MR scan because of pacemaker. COMPARISON:  06/29/2016 head CT. FINDINGS: CT HEAD Brain: Small vessel disease changes without CT evidence of large acute infarct. No intracranial mass or abnormal enhancement. Global mild atrophy without hydrocephalus. Calvarium and skull base: Negative. Paranasal sinuses: Minimal left maxillary sinus mucosal thickening. Orbits: No acute abnormality. CTA NECK Aortic arch:  Calcified plaque without high-grade stenosis. Three vessel arch. Right carotid system: Plaque proximal right internal carotid artery with greater than 75% diameter stenosis. Left carotid system: Ectatic left carotid artery with bifurcation  posterior to the pharynx. No hemodynamically significant stenosis of the left internal carotid artery. Vertebral arteries:Mild narrowing origin of vertebral arteries bilaterally. Left vertebral artery dominant in size. Skeleton: Multilevel cervical spondylotic changes with various degrees of spinal stenosis and foraminal narrowing. Limbus vertebra C7/protrusion with marked canal narrowing. Other neck: Bilateral pleural effusions greater on right. Lung parenchymal changes partially reflective of pulmonary vascular congestion however, ground-glass opacities throughout the right lung apex including 1.4 cm rounded consolidation. This appearance is atypical for congestive heart. Recommend follow-up chest CT in 3 months after treatment of any congestive heart failure or infectious infiltrate. CTA HEAD Anterior circulation: Prominent calcification cavernous segment internal carotid artery bilaterally with moderate to marked narrowing. Mild narrowing left middle cerebral artery M1 segment. Moderate narrowing right middle cerebral artery M1 segment. Moderate to marked narrowing A1 segment right anterior cerebral artery. Posterior circulation: Left vertebral artery is dominant. Moderate to marked narrowing vertebral arteries at the level of the foramen magnum. Mild narrowing and irregularity basilar artery. Moderate narrowing proximal mid and distal aspect posterior cerebral artery bilaterally. Venous sinuses: Patent. Anatomic variants: Negative. Delayed phase: Negative IMPRESSION: CT Small vessel disease changes without CT evidence of large acute infarct. No intracranial mass or abnormal enhancement. Global mild atrophy without hydrocephalus. CTA NECK Aortic atherosclerosis. Plaque  proximal right internal carotid artery with greater than 75% diameter stenosis. Ectatic left carotid artery with bifurcation posterior to the pharynx. No hemodynamically significant stenosis of the left internal carotid artery. Mild narrowing origin of vertebral arteries bilaterally. Left vertebral artery dominant in size. Multilevel cervical spondylotic changes with various degrees of spinal stenosis and foraminal narrowing. Limbus vertebra C7/protrusion with marked canal narrowing. Bilateral pleural effusions greater on right. Lung parenchymal changes partially reflective of pulmonary vascular congestion however, ground-glass opacities throughout the right lung apex including 1.4 cm rounded consolidation. This appearance is atypical for congestive heart. Recommend follow-up chest CT in 3 months after treatment of any congestive heart failure or infectious infiltrate. CTA HEAD Prominent calcification cavernous segment internal carotid artery bilaterally with moderate to marked narrowing. Mild narrowing left middle cerebral artery M1 segment. Moderate narrowing right middle cerebral artery M1 segment. Moderate to marked narrowing A1 segment right anterior cerebral artery. Left vertebral artery is dominant. Moderate to marked narrowing vertebral arteries at the level of the foramen magnum. Mild narrowing and irregularity basilar artery. Moderate narrowing proximal mid and distal aspect posterior cerebral artery bilaterally. These results were called by telephone at the time of interpretation on 06/29/2016 at 5:43 pm to Dr. Derwood Kaplan , who verbally acknowledged these results. Electronically Signed   By: Lacy Duverney M.D.   On: 06/29/2016 18:31   Dg Chest Port 1 View  06/29/2016  CLINICAL DATA:  Right-sided weakness and aphasia EXAM: PORTABLE CHEST 1 VIEW COMPARISON:  07/05/2015 FINDINGS: Pacing device is again identified and stable. Cardiomegaly is again noted. Aortic calcifications are seen and stable. The  lungs are well aerated bilaterally. Mild congestive failure is again noted. No focal infiltrate is seen. IMPRESSION: Mild CHF. Aortic atherosclerosis. Electronically Signed   By: Alcide Clever M.D.   On: 06/29/2016 18:30   Ct Head Code Stroke W/o Cm  06/29/2016  CLINICAL DATA:  Code stroke.  Right-sided weakness and facial droop. EXAM: CT HEAD WITHOUT CONTRAST TECHNIQUE: Contiguous axial images were obtained from the base of the skull through the vertex without intravenous contrast. COMPARISON:  05/03/2005 FINDINGS: Brain: No evidence of cortical infarction, hemorrhage, hydrocephalus, or mass lesion/mass effect. Patchy white matter ischemic gliosis  that has progressed since previous, overall mild for age. There have been lacunar infarcts in the bilateral thalamus. Dilated perivascular space below the right putamen. Aspects is 10. Vascular: No hyperdense vessel. Calcified intracranial atherosclerosis Skull: Negative for fracture or focal lesion. Sinuses/Orbits: No acute finding. Other: None. Attempt to call at the time of interpretation on 06/29/2016 at 5:29 and 5:34 pm to Dr. Nicholas LoseEshraghi, but cell phone not working [phone] These results were called by telephone at the time of interpretation on 06/29/2016 at 5:36 pm to Dr. Tyrone AppleEMILY NGUYEN , who verbally acknowledged these results. IMPRESSION: 1. No intracranial hemorrhage or definite acute infarct. 2. Chronic microvascular disease and mild for age atrophy. Electronically Signed   By: Marnee SpringJonathon  Watts M.D.   On: 06/29/2016 17:37    Assessment/Plan  Physical deconditioning Will have her work with physical therapy and occupational therapy team to help with gait training and muscle strengthening exercises.fall precautions. Skin care. Encourage to be out of bed.  Acute CVA Continue coumadin with plavix until inr is therapeutic and then discontinue plavix. Has f/u with neurology. Continue atorvastatin. Will have patient work with PT/OT as tolerated to regain  strength and restore function.  Fall precautions are in place.  Dysphagia Get SLP consult, aspiration precautions  Dysarthria Get SLP to evaluate and treat  Chronic CHF With recent exacerbation, monitor daily weight. Continue coreg 12.5 mg bid, lasix 80 mg bid, imdur 30 mg daily, hydralazine 10 mg tid. check bmp  Hypokalemia Monitor bmp  ckd stage 5 Monitor bmp, avoid nephrotoxic agent. Continue zemplar and rena vite  Anemia of chronic disease Monitor cbc  Asthma Stable, continue albuterol  Constipation Continue colace 100 mg bid  DM type 2 Lab Results  Component Value Date   HGBA1C 5.4 06/30/2016   Continue januvia, check cbg daily  insomnia Continue melatonin, stable  CAD Chest pain free. Continue prn NTG with b blocker, hydralazine, imdur  afib Rate controlled. Continue coreg and coumadin. inr today 1.2. See below  Long term anticoagulation inr today 1.2. Increase coumadin to 5 mg daily ad check inr 07/09/16. Once inr > 2, d/c plavix.    Goals of care: short term rehabilitation   Labs/tests ordered: cbc, cmp, inr 07/09/16  Family/ staff Communication: reviewed care plan with patient and nursing supervisor    Oneal GroutMAHIMA Lyndsay Talamante, MD Internal Medicine [facility] [address] [place], [place] [address] Cell Phone (Monday-Friday 8 am - 5 pm): [phone] On Call: [phone] and follow prompts after 5 pm and on weekends Office Phone: [phone] Office Fax: [phone]

## 2016-07-05 NOTE — Telephone Encounter (Signed)
Pt daughter aware of Dr.Smith's recommendation. Please continue furosemide at the higher dose. 80mg  bid.  Pt daughter Orlean Pattenancy sts that she is not sure who is managing the pt's coumadin, but would prefer management be taken over by Dr.Smith's office. [facility]. Adv pt that I will fwd the update to Dr.Smith. If pt's coumadin is going to be managed by [facility] I will fwd the message to our coumadin clinic. Pt daughter agreeable with plan.

## 2016-07-06 ENCOUNTER — Encounter (HOSPITAL_COMMUNITY): Payer: Medicare Other

## 2016-07-06 NOTE — Telephone Encounter (Signed)
Pt daughter  Harriett Sineancy aware and will let our office know when [facility], so that cvrr can take over the pt's coumadin management

## 2016-07-06 NOTE — Telephone Encounter (Signed)
Spoke with Wayne Cityandance, RN in our clinic. [facility]  Manages the residents INR, normally after [place]/c [place] [facility] our office would take over. If Dr.Smith wants us to manage the pt's INR while she is residing [facility] we will need a written physician order. Adv Candace I will fwd the update to Dr.Smith to determine how to proceed.

## 2016-07-06 NOTE — Telephone Encounter (Signed)
Allow Dorothy Bartlett to manage the INR and we will assume control after discharge. Make sure her daughter knows about this decision.

## 2016-07-09 LAB — HEPATIC FUNCTION PANEL
ALK PHOS: 52 U/L (ref 25–125)
ALT: 28 U/L (ref 7–35)
AST: 31 U/L (ref 13–35)
Bilirubin, Total: 0.5 mg/dL

## 2016-07-09 LAB — BASIC METABOLIC PANEL
BUN: 69 mg/dL — AB (ref 4–21)
CREATININE: 4.7 mg/dL — AB (ref 0.5–1.1)
GLUCOSE: 100 mg/dL
POTASSIUM: 4 mmol/L (ref 3.4–5.3)
Sodium: 141 mmol/L (ref 137–147)

## 2016-07-09 LAB — CBC AND DIFFERENTIAL
HCT: 32 % — AB (ref 36–46)
Hemoglobin: 9.7 g/dL — AB (ref 12.0–16.0)
Neutrophils Absolute: 4 /uL
PLATELETS: 167 10*3/uL (ref 150–399)
WBC: 6.2 10*3/mL

## 2016-07-17 ENCOUNTER — Non-Acute Institutional Stay (SKILLED_NURSING_FACILITY): Payer: Medicare Other | Admitting: Adult Health

## 2016-07-17 ENCOUNTER — Encounter: Payer: Self-pay | Admitting: Adult Health

## 2016-07-17 DIAGNOSIS — Z7901 Long term (current) use of anticoagulants: Secondary | ICD-10-CM | POA: Diagnosis not present

## 2016-07-17 DIAGNOSIS — I480 Paroxysmal atrial fibrillation: Secondary | ICD-10-CM | POA: Diagnosis not present

## 2016-07-17 NOTE — Progress Notes (Signed)
Subjective:     Indication: atrial fibrillation Bleeding signs/symptoms: None Thromboembolic signs/symptoms: None  Missed Coumadin doses: None Medication changes: no Dietary changes: no Bacterial/viral infection: no Other concerns: no  The following portions of the patient's history were reviewed and updated as appropriate: allergies, current medications, past family history, past medical history, past social history, past surgical history and problem list.  Review of Systems A comprehensive review of systems was negative.   Objective:    INR Today: 1.6 Current dose: Coumadin 5 mg daily     Assessment:    Subtherapeutic INR for goal of 2-3   Plan:    1. New dose: Increase Coumadin to 6 mg daily   2. Next INR: 07/19/16

## 2016-07-19 ENCOUNTER — Encounter: Payer: Self-pay | Admitting: Adult Health

## 2016-07-19 ENCOUNTER — Non-Acute Institutional Stay (SKILLED_NURSING_FACILITY): Payer: Medicare Other | Admitting: Adult Health

## 2016-07-19 DIAGNOSIS — D631 Anemia in chronic kidney disease: Secondary | ICD-10-CM

## 2016-07-19 DIAGNOSIS — R471 Dysarthria and anarthria: Secondary | ICD-10-CM

## 2016-07-19 DIAGNOSIS — J45909 Unspecified asthma, uncomplicated: Secondary | ICD-10-CM | POA: Diagnosis not present

## 2016-07-19 DIAGNOSIS — I480 Paroxysmal atrial fibrillation: Secondary | ICD-10-CM | POA: Diagnosis not present

## 2016-07-19 DIAGNOSIS — Z9861 Coronary angioplasty status: Secondary | ICD-10-CM

## 2016-07-19 DIAGNOSIS — N189 Chronic kidney disease, unspecified: Secondary | ICD-10-CM

## 2016-07-19 DIAGNOSIS — Z7901 Long term (current) use of anticoagulants: Secondary | ICD-10-CM | POA: Diagnosis not present

## 2016-07-19 DIAGNOSIS — I2510 Atherosclerotic heart disease of native coronary artery without angina pectoris: Secondary | ICD-10-CM

## 2016-07-19 DIAGNOSIS — I5042 Chronic combined systolic (congestive) and diastolic (congestive) heart failure: Secondary | ICD-10-CM

## 2016-07-19 DIAGNOSIS — N185 Chronic kidney disease, stage 5: Secondary | ICD-10-CM

## 2016-07-19 DIAGNOSIS — E876 Hypokalemia: Secondary | ICD-10-CM

## 2016-07-19 DIAGNOSIS — K5900 Constipation, unspecified: Secondary | ICD-10-CM

## 2016-07-19 DIAGNOSIS — I639 Cerebral infarction, unspecified: Secondary | ICD-10-CM

## 2016-07-19 DIAGNOSIS — I69391 Dysphagia following cerebral infarction: Secondary | ICD-10-CM

## 2016-07-19 DIAGNOSIS — R5381 Other malaise: Secondary | ICD-10-CM

## 2016-07-19 DIAGNOSIS — G4700 Insomnia, unspecified: Secondary | ICD-10-CM

## 2016-07-19 DIAGNOSIS — E1121 Type 2 diabetes mellitus with diabetic nephropathy: Secondary | ICD-10-CM

## 2016-07-19 NOTE — Progress Notes (Signed)
Patient ID: Dorothy Bartlett, female   DOB: 1926-08-11, 82 y.o.   MRN: [id number]    DATE:  07/19/2016   MRN:  [id number]  BIRTHDAY: [birth year]  Facility:  Nursing Home Location:  [facility]  Nursing Home Room Number: 1107-P  LEVEL OF CARE:  SNF (31)  Contact Information    Name Relation Home Work Mobile   Quentin Daughter [phone]  [phone]   Estey,Chris Son [phone]  [phone]   Farquhar,Prince Son   [phone]       Code Status History    Date Active Date Inactive Code Status Order ID Comments User Context   06/29/2016 11:08 PM 07/04/2016  7:35 PM Full Code [id number]  Bobette Mo, MD Inpatient   07/03/2015 11:16 PM 07/05/2015  9:40 PM Full Code [id number]  Hyacinth Meeker, MD ED   06/13/2015  3:32 PM 06/15/2015  7:09 PM Full Code [id number]  Duke Salvia, MD Inpatient   06/11/2015 10:03 AM 06/13/2015  3:32 PM Full Code [id number]  Otis Brace, MD ED   05/10/2015  5:05 PM 05/15/2015  3:58 PM Partial Code [id number]  Hillis Range, MD Inpatient   05/08/2015  2:59 PM 05/10/2015  5:05 PM Partial Code [id number]  Othella Boyer, MD ED   08/04/2014  6:51 PM 08/10/2014  5:07 PM Full Code [id number]  Lars Mage, PA-C Inpatient   08/03/2014 12:42 PM 08/04/2014  6:44 PM Full Code [id number]  Dara Lords, PA-C Inpatient   04/13/2014  6:30 PM 04/18/2014  2:06 PM Full Code [id number]  Edsel Petrin, DO ED   03/11/2014  2:18 PM 04/13/2014  6:30 PM Full Code [id number]  Margit Hanks, MD Outpatient   02/24/2014  6:48 PM 03/02/2014  9:43 PM Full Code [id number]  Lars Mage, PA-C Inpatient   02/22/2014  2:51 PM 02/24/2014  6:23 PM Full Code [id number]  Larina Earthly, MD Inpatient   01/07/2014  6:55 PM 01/08/2014 11:55 AM Full Code [id number]  Dara Lords, PA-C Inpatient       Chief Complaint  Patient presents with  . Discharge Note    HISTORY OF PRESENT ILLNESS:  This is an 82 year old female who is for discharge home with Home health PT, OT, ST, CNA and Nursing for  Coumadin management. She will discharge with medications and will be given prescriptions.   She has been admitted to [facility] on 07/04/16 from [facility] with acute CVA with right hemiparesis, dysarthria, dysphagia and facial asymmetry. She was seen by neurology. She was noted to have A. fib on interrogation of her pacemaker. She was started on Coumadin with cardiology consult and is also on Plavix until INR gets therapeutic. She had acute respiratory failure with CHF exacerbation in the hospital and required diuresis. She has AKI on CKD stage 5.                                      PAST MEDICAL HISTORY:  Past Medical History  Diagnosis Date  . Hyperlipidemia   . Carotid stenosis     Carotid US (12/2013): < 60% RCA, < 40% LICA.  [name] Hypertension   . Coronary artery disease     a. s/p MI 1995 tx w/ PTCA to Dx, Ramus, ap LAD;  b. LHC (11/2004):  Inferior HK, EF 60%, proximal LAD 50%, ostial D1 80%, mid D1 95%, mid LAD 50-60%,  distal LAD at the apex 90%, OM1 occluded (fills late by left to left collaterals), RCA 75%. Medical therapy recommended.;  c. Lexiscan Myoview (12/2013):  Prior ant-lat scar with very small area of peri-infarct ischemia in ant wall, EF 40 (high risk)   . Obesity (BMI 30-39.9)   . Intrinsic asthma 01/28/2008     Mild intermittant asthma, PFT's 01/28/08     . Old anterolateral myocardial infarction   . PVD (peripheral vascular disease) (HCC) 02/16/2014    2015, right femoral peroneal bypass grafting and left femoral-tibial bypass grafting by Dr. Arbie Cookey for rest pain as well as nonhealing ulcer on toe accompanied with toe amputation  Arteriogram October 2015 with patent graft on the right, 70% stenosis of graft on the left proximally.   . Chronic combined systolic and diastolic heart failure (HCC) 04/13/2014  . Complete heart block (HCC)     a. 05/2015 s/p [facility] Assurity DR model [id number] dual chamber PPM (serial number [id number]) (Dr. Johney Frame).  [name] Hx of  echocardiogram     a. Echo 5/16: dist septal, dist ant, dist inf and apical AK, EF 40-45%, mod MR, mild LAE, PASP 72  . Presence of permanent cardiac pacemaker   . Chronic kidney disease (CKD), stage V (HCC)     notes 07/03/2015; Dr. Hyman Hopes  . 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 Type 2 diabetes mellitus with renal manifestations (HCC)      CURRENT MEDICATIONS: Reviewed  Patient's Medications  New Prescriptions   No medications on file  Previous Medications   ALBUTEROL (PROVENTIL HFA;VENTOLIN HFA) 108 (90 BASE) MCG/ACT INHALER    Inhale 2 puffs into the lungs every 6 (six) hours as needed for wheezing or shortness of breath.   ATORVASTATIN (LIPITOR) 80 MG TABLET    Take 1 tablet (80 mg total) by mouth every morning.   B COMPLEX-C-FOLIC ACID (RENA-VITE RX) 1 MG TABS    Take 1 tablet by mouth every morning.    CARVEDILOL (COREG) 12.5 MG TABLET    Take 1 tablet (12.5 mg total) by mouth 2 (two) times daily.   CLOPIDOGREL (PLAVIX) 75 MG TABLET    Take 1 tablet (75 mg total) by mouth daily. Discontinue when INR > 2.0   DOCUSATE SODIUM (COLACE) 100 MG CAPSULE    Take 100 mg by mouth 2 (two) times daily.    FUROSEMIDE (LASIX) 80 MG TABLET    Take 1 tablet (80 mg total) by mouth 2 (two) times daily.   HYDRALAZINE (APRESOLINE) 10 MG TABLET    Take 1 tablet (10 mg total) by mouth 3 (three) times daily.   ISOSORBIDE MONONITRATE (IMDUR) 30 MG 24 HR TABLET    Take 30 mg by mouth daily.   JANUVIA 25 MG TABLET    Take 25 mg by mouth daily.   LORATADINE (CLARITIN) 10 MG TABLET    Take 10 mg by mouth daily as needed for allergies.   MELATONIN 3 MG TABS    Take 3 mg by mouth at bedtime as needed (for sleep).    NITROGLYCERIN (NITROSTAT) 0.4 MG SL TABLET    Place 1 tablet (0.4 mg total) under the tongue every 5 (five) minutes x 3 doses as needed for chest pain.   PARICALCITOL (ZEMPLAR) 1 MCG CAPSULE    Take 1 mcg by mouth daily.    POTASSIUM CHLORIDE SA  (K-DUR,KLOR-CON) 20 MEQ TABLET    Take 1 tablet (20 mEq total) by mouth  once.   VITAMIN D, ERGOCALCIFEROL, (DRISDOL) 50000 UNITS CAPS CAPSULE    Take 1 capsule (50,000 Units total) by mouth every 7 (seven) days.   WARFARIN (COUMADIN) 6 MG TABLET    Take 6 mg by mouth daily.  Modified Medications   No medications on file  Discontinued Medications   WARFARIN (COUMADIN) 4 MG TABLET    Take 1 tablet (4 mg total) by mouth daily at 6 PM.     No Known Allergies   REVIEW OF SYSTEMS:  GENERAL: no change in appetite, no fatigue, no weight changes, no fever, chills or weakness EYES: Denies change in vision, dry eyes, eye pain, itching or discharge EARS: Denies change in hearing, ringing in ears, or earache NOSE: Denies nasal congestion or epistaxis MOUTH and THROAT: Denies oral discomfort, gingival pain or bleeding, pain from teeth or hoarseness   RESPIRATORY: no cough, SOB, DOE, wheezing, hemoptysis CARDIAC: no chest pain, edema or palpitations GI: no abdominal pain, diarrhea, constipation, heart burn, nausea or vomiting GU: Denies dysuria, frequency, hematuria, incontinence, or discharge PSYCHIATRIC: Denies feeling of depression or anxiety. No report of hallucinations, insomnia, paranoia, or agitation   PHYSICAL EXAMINATION  GENERAL APPEARANCE: Well nourished. In no acute distress. Normal body habitus SKIN:  Skin is warm and dry.  HEAD: Normal in size and contour. No evidence of trauma EYES: Lids open and close normally. No blepharitis, entropion or ectropion. PERRL. Conjunctivae are clear and sclerae are white. Lenses are without opacity EARS: Pinnae are normal. Patient hears normal voice tunes of the examiner MOUTH and THROAT: Lips are without lesions. Oral mucosa is moist and without lesions. Tongue is normal in shape, size, and color and without lesions NECK: supple, trachea midline, no neck masses, no thyroid tenderness, no thyromegaly LYMPHATICS: no LAN in the neck, no  supraclavicular LAN RESPIRATORY: breathing is even & unlabored, BS CTAB CARDIAC: RRR, no murmur,no extra heart sounds, no edema, left chest pacemaker GI: abdomen soft, normal BS, no masses, no tenderness, no hepatomegaly, no splenomegaly EXTREMITIES:  Able to move 4 extremities NEUROLOGICAL: Right hemiparesis PSYCHIATRIC: Alert and oriented X 3. Affect and behavior are appropriate  LABS/RADIOLOGY: Labs reviewed: Basic Metabolic Panel:  Recent Labs  16/10/96 0731  07/02/16 0306 07/03/16 0634 07/04/16 0205 07/09/16  NA 142  < > 143 138 138 141  K 4.0  < > 3.8 3.6 3.4* 4.0  CL 100*  < > 93* 96* 96*  --   CO2 29  < > 30 29 28   --   GLUCOSE 80  < > 75 73 136*  --   BUN 64*  < > 64* 65* 70* 69*  CREATININE 5.29*  < > 4.96* 4.97* 4.94* 4.7*  CALCIUM 10.2  < > 11.0* 10.2 10.2  --   MG 2.6*  --   --   --   --   --   < > = values in this interval not displayed. Liver Function Tests:  Recent Labs  06/29/16 1711 07/09/16  AST 26 31  ALT 18 28  ALKPHOS 56 52  BILITOT 1.0  --   PROT 7.2  --   ALBUMIN 3.8  --    CBC:  Recent Labs  06/30/16 0731 07/01/16 0346 07/02/16 0306 07/09/16  WBC 6.2 7.0 7.5 6.2  NEUTROABS 4.5 5.9 4.8 4  HGB 10.0* 9.8* 9.5* 9.7*  HCT 33.5* 32.9* 31.4* 32*  MCV 98.8 99.4 99.7  --   PLT 212 187 186 167   A1C: Invalid  input(s): A1C Lipid Panel:  Recent Labs  06/30/16 0731  HDL 49   CBG:  Recent Labs  07/03/16 2147 07/04/16 0604 07/04/16 1106  GLUCAP 214* 133* 194*      Ct Angio Head W Or Wo Contrast  06/29/2016  CLINICAL DATA:  82 year old female with right-sided weakness and facial droop. EXAM: CT ANGIOGRAPHY HEAD AND NECK TECHNIQUE: Multidetector CT imaging of the head and neck was performed using the standard protocol during bolus administration of intravenous contrast. Multiplanar CT image reconstructions and MIPs were obtained to evaluate the vascular anatomy. Carotid stenosis measurements (when applicable) are obtained utilizing  NASCET criteria, using the distal internal carotid diameter as the denominator. CONTRAST:  50 cc Isovue 370. This decision was made by the neurologist based on risk benefit ratio and the fact the patient cannot have an MR scan because of pacemaker. COMPARISON:  06/29/2016 head CT. FINDINGS: CT HEAD Brain: Small vessel disease changes without CT evidence of large acute infarct. No intracranial mass or abnormal enhancement. Global mild atrophy without hydrocephalus. Calvarium and skull base: Negative. Paranasal sinuses: Minimal left maxillary sinus mucosal thickening. Orbits: No acute abnormality. CTA NECK Aortic arch: Calcified plaque without high-grade stenosis. Three vessel arch. Right carotid system: Plaque proximal right internal carotid artery with greater than 75% diameter stenosis. Left carotid system: Ectatic left carotid artery with bifurcation posterior to the pharynx. No hemodynamically significant stenosis of the left internal carotid artery. Vertebral arteries:Mild narrowing origin of vertebral arteries bilaterally. Left vertebral artery dominant in size. Skeleton: Multilevel cervical spondylotic changes with various degrees of spinal stenosis and foraminal narrowing. Limbus vertebra C7/protrusion with marked canal narrowing. Other neck: Bilateral pleural effusions greater on right. Lung parenchymal changes partially reflective of pulmonary vascular congestion however, ground-glass opacities throughout the right lung apex including 1.4 cm rounded consolidation. This appearance is atypical for congestive heart. Recommend follow-up chest CT in 3 months after treatment of any congestive heart failure or infectious infiltrate. CTA HEAD Anterior circulation: Prominent calcification cavernous segment internal carotid artery bilaterally with moderate to marked narrowing. Mild narrowing left middle cerebral artery M1 segment. Moderate narrowing right middle cerebral artery M1 segment. Moderate to marked  narrowing A1 segment right anterior cerebral artery. Posterior circulation: Left vertebral artery is dominant. Moderate to marked narrowing vertebral arteries at the level of the foramen magnum. Mild narrowing and irregularity basilar artery. Moderate narrowing proximal mid and distal aspect posterior cerebral artery bilaterally. Venous sinuses: Patent. Anatomic variants: Negative. Delayed phase: Negative IMPRESSION: CT Small vessel disease changes without CT evidence of large acute infarct. No intracranial mass or abnormal enhancement. Global mild atrophy without hydrocephalus. CTA NECK Aortic atherosclerosis. Plaque proximal right internal carotid artery with greater than 75% diameter stenosis. Ectatic left carotid artery with bifurcation posterior to the pharynx. No hemodynamically significant stenosis of the left internal carotid artery. Mild narrowing origin of vertebral arteries bilaterally. Left vertebral artery dominant in size. Multilevel cervical spondylotic changes with various degrees of spinal stenosis and foraminal narrowing. Limbus vertebra C7/protrusion with marked canal narrowing. Bilateral pleural effusions greater on right. Lung parenchymal changes partially reflective of pulmonary vascular congestion however, ground-glass opacities throughout the right lung apex including 1.4 cm rounded consolidation. This appearance is atypical for congestive heart. Recommend follow-up chest CT in 3 months after treatment of any congestive heart failure or infectious infiltrate. CTA HEAD Prominent calcification cavernous segment internal carotid artery bilaterally with moderate to marked narrowing. Mild narrowing left middle cerebral artery M1 segment. Moderate narrowing right middle cerebral artery M1  segment. Moderate to marked narrowing A1 segment right anterior cerebral artery. Left vertebral artery is dominant. Moderate to marked narrowing vertebral arteries at the level of the foramen magnum. Mild  narrowing and irregularity basilar artery. Moderate narrowing proximal mid and distal aspect posterior cerebral artery bilaterally. These results were called by telephone at the time of interpretation on 06/29/2016 at 5:43 pm to Dr. Derwood KaplanANKIT NANAVATI , who verbally acknowledged these results. Electronically Signed   By: Lacy DuverneySteven  Olson M.D.   On: 06/29/2016 18:31   Ct Head Wo Contrast  07/01/2016  CLINICAL DATA:  82 year old hypertensive female with right-sided weakness and facial droop. Subsequent encounter. EXAM: CT HEAD WITHOUT CONTRAST TECHNIQUE: Contiguous axial images were obtained from the base of the skull through the vertex without intravenous contrast. COMPARISON:  06/29/2016 CT angiogram and head CT. FINDINGS: No intracranial hemorrhage. Prominent chronic microvascular changes without CT evidence of large acute infarct. Tiny calcification left frontal lobe unchanged of indeterminate etiology. Prominent peri vascular space versus remote infarct right basal ganglia. Moderate global atrophy without hydrocephalus. No intracranial mass lesion noted on this unenhanced exam. Vascular calcifications. Exophthalmos. Minimal mucosal thickening left maxillary sinus. IMPRESSION: No intracranial hemorrhage or CT evidence of large acute infarct. Prominent chronic microvascular changes. Atrophy. Vascular calcifications. Exophthalmos. Electronically Signed   By: Lacy DuverneySteven  Olson M.D.   On: 07/01/2016 08:02   Ct Angio Neck W Or Wo Contrast  06/29/2016  CLINICAL DATA:  82 year old female with right-sided weakness and facial droop. EXAM: CT ANGIOGRAPHY HEAD AND NECK TECHNIQUE: Multidetector CT imaging of the head and neck was performed using the standard protocol during bolus administration of intravenous contrast. Multiplanar CT image reconstructions and MIPs were obtained to evaluate the vascular anatomy. Carotid stenosis measurements (when applicable) are obtained utilizing NASCET criteria, using the distal internal carotid  diameter as the denominator. CONTRAST:  50 cc Isovue 370. This decision was made by the neurologist based on risk benefit ratio and the fact the patient cannot have an MR scan because of pacemaker. COMPARISON:  06/29/2016 head CT. FINDINGS: CT HEAD Brain: Small vessel disease changes without CT evidence of large acute infarct. No intracranial mass or abnormal enhancement. Global mild atrophy without hydrocephalus. Calvarium and skull base: Negative. Paranasal sinuses: Minimal left maxillary sinus mucosal thickening. Orbits: No acute abnormality. CTA NECK Aortic arch: Calcified plaque without high-grade stenosis. Three vessel arch. Right carotid system: Plaque proximal right internal carotid artery with greater than 75% diameter stenosis. Left carotid system: Ectatic left carotid artery with bifurcation posterior to the pharynx. No hemodynamically significant stenosis of the left internal carotid artery. Vertebral arteries:Mild narrowing origin of vertebral arteries bilaterally. Left vertebral artery dominant in size. Skeleton: Multilevel cervical spondylotic changes with various degrees of spinal stenosis and foraminal narrowing. Limbus vertebra C7/protrusion with marked canal narrowing. Other neck: Bilateral pleural effusions greater on right. Lung parenchymal changes partially reflective of pulmonary vascular congestion however, ground-glass opacities throughout the right lung apex including 1.4 cm rounded consolidation. This appearance is atypical for congestive heart. Recommend follow-up chest CT in 3 months after treatment of any congestive heart failure or infectious infiltrate. CTA HEAD Anterior circulation: Prominent calcification cavernous segment internal carotid artery bilaterally with moderate to marked narrowing. Mild narrowing left middle cerebral artery M1 segment. Moderate narrowing right middle cerebral artery M1 segment. Moderate to marked narrowing A1 segment right anterior cerebral artery.  Posterior circulation: Left vertebral artery is dominant. Moderate to marked narrowing vertebral arteries at the level of the foramen magnum. Mild narrowing and irregularity  basilar artery. Moderate narrowing proximal mid and distal aspect posterior cerebral artery bilaterally. Venous sinuses: Patent. Anatomic variants: Negative. Delayed phase: Negative IMPRESSION: CT Small vessel disease changes without CT evidence of large acute infarct. No intracranial mass or abnormal enhancement. Global mild atrophy without hydrocephalus. CTA NECK Aortic atherosclerosis. Plaque proximal right internal carotid artery with greater than 75% diameter stenosis. Ectatic left carotid artery with bifurcation posterior to the pharynx. No hemodynamically significant stenosis of the left internal carotid artery. Mild narrowing origin of vertebral arteries bilaterally. Left vertebral artery dominant in size. Multilevel cervical spondylotic changes with various degrees of spinal stenosis and foraminal narrowing. Limbus vertebra C7/protrusion with marked canal narrowing. Bilateral pleural effusions greater on right. Lung parenchymal changes partially reflective of pulmonary vascular congestion however, ground-glass opacities throughout the right lung apex including 1.4 cm rounded consolidation. This appearance is atypical for congestive heart. Recommend follow-up chest CT in 3 months after treatment of any congestive heart failure or infectious infiltrate. CTA HEAD Prominent calcification cavernous segment internal carotid artery bilaterally with moderate to marked narrowing. Mild narrowing left middle cerebral artery M1 segment. Moderate narrowing right middle cerebral artery M1 segment. Moderate to marked narrowing A1 segment right anterior cerebral artery. Left vertebral artery is dominant. Moderate to marked narrowing vertebral arteries at the level of the foramen magnum. Mild narrowing and irregularity basilar artery. Moderate narrowing  proximal mid and distal aspect posterior cerebral artery bilaterally. These results were called by telephone at the time of interpretation on 06/29/2016 at 5:43 pm to Dr. Derwood Kaplan , who verbally acknowledged these results. Electronically Signed   By: Lacy Duverney M.D.   On: 06/29/2016 18:31   Dg Chest Port 1 View  06/29/2016  CLINICAL DATA:  Right-sided weakness and aphasia EXAM: PORTABLE CHEST 1 VIEW COMPARISON:  07/05/2015 FINDINGS: Pacing device is again identified and stable. Cardiomegaly is again noted. Aortic calcifications are seen and stable. The lungs are well aerated bilaterally. Mild congestive failure is again noted. No focal infiltrate is seen. IMPRESSION: Mild CHF. Aortic atherosclerosis. Electronically Signed   By: Alcide Clever M.D.   On: 06/29/2016 18:30   Ct Head Code Stroke W/o Cm  06/29/2016  CLINICAL DATA:  Code stroke.  Right-sided weakness and facial droop. EXAM: CT HEAD WITHOUT CONTRAST TECHNIQUE: Contiguous axial images were obtained from the base of the skull through the vertex without intravenous contrast. COMPARISON:  05/03/2005 FINDINGS: Brain: No evidence of cortical infarction, hemorrhage, hydrocephalus, or mass lesion/mass effect. Patchy white matter ischemic gliosis that has progressed since previous, overall mild for age. There have been lacunar infarcts in the bilateral thalamus. Dilated perivascular space below the right putamen. Aspects is 10. Vascular: No hyperdense vessel. Calcified intracranial atherosclerosis Skull: Negative for fracture or focal lesion. Sinuses/Orbits: No acute finding. Other: None. Attempt to call at the time of interpretation on 06/29/2016 at 5:29 and 5:34 pm to Dr. Nicholas Lose, but cell phone not working [phone]). These results were called by telephone at the time of interpretation on 06/29/2016 at 5:36 pm to Dr. Tyrone Apple , who verbally acknowledged these results. IMPRESSION: 1. No intracranial hemorrhage or definite acute infarct. 2.  Chronic microvascular disease and mild for age atrophy. Electronically Signed   By: Marnee Spring M.D.   On: 06/29/2016 17:37    ASSESSMENT/PLAN:  Physical deconditioning - for home health PT, OT, ST, CNA and Nursing  Acute CVA - for home health PT, OT, CNA, ST and Nursing continue Coumadin with Plavix until INR is therapeutic and then  discontinue Plavix; follow-up with neurology; continue atorvastatin  Long-term use of anticoagulant - INR 1.7, subtherapeutic; give 8 mg of Coumadin 1 and recheck INR on 07/20/16  Dysphagia - will have home health SLP; aspiration precautions  Dysarthria - will have home health SLP treatment  Chronic combined systolic and diastolic CHF - continue Coreg 16.1 mg 1 tab by mouth twice a day, Imdur 30 mg daily, hydralazine 10 mg 3 times a day  and Lasix 80 mg 1 tab by mouth twice a day   Chronic kidney disease stage V - avoid nephrotoxic agents; continue Paricalcitol 1 g daily and Rena vite 1 tab daily Lab Results  Component Value Date   CREATININE 4.7* 07/09/2016   Anemia of chronic disease - stable Lab Results  Component Value Date   WBC 6.2 07/09/2016   HGB 9.7* 07/09/2016   HCT 32* 07/09/2016   MCV 99.7 07/02/2016   PLT 167 07/09/2016   Asthma - no SOB nor wheezing; continue Proventil HFA 90 g inhaler 2 puffs into lungs every 6 hours when necessary and loratadine 10 mg 1 tab by mouth daily when necessary  Constipation - continue Colace 100 mg 1 capsule by mouth twice a day  Diabetes mellitus, type II - continue Januvia 25 mg 1 tab by mouth daily Lab Results  Component Value Date   HGBA1C 5.4 06/30/2016   Insomnia - continue melatonin 3 mg 1 tab by mouth every at bedtime when necessary  CAD - stable; continue NTG when necessary, carvedilol 12.5 mg 1 tab by mouth twice a day, hydralazine 10 mg 1 tab by mouth 3 times a day and Imdur ER 30 mg 1 tab by mouth daily   Atrial fibrillation - rate controlled; continue carvedilol 12.5 mg 1 tab by  mouth twice a day and Coumadin  Hypokalemia - continue KCL ER 20 meq 1 tab daily Lab Results  Component Value Date   K 4.0 07/09/2016       I have filled out patient's discharge paperwork and written prescriptions.  Patient will receive home health PT, OT, ST, Nursing and CNA.  Total discharge time: Greater than 30 minutes  Discharge time involved coordination of the discharge process with social worker, nursing staff and therapy department. Medical justification for home health services  verified.     Kenard Gower, NP [employer] [phone]

## 2016-07-20 ENCOUNTER — Telehealth: Payer: Self-pay | Admitting: Interventional Cardiology

## 2016-07-20 NOTE — Telephone Encounter (Signed)
Spoke with Megan,Pharm -D Pt should be scheduled for a new pt coumadin appt prior to homehealth checks. She should be scheduled to be seen in the coumadin clinic sometime next week. Spoke with pt's daughter, next Tues would be preferred. appt scheduled for new pt Coumadin for 7/25 @ 10:30am

## 2016-07-20 NOTE — Telephone Encounter (Signed)
New message      Pt is being released from rehab on Saturday.  She needs to know if Dr Katrinka BlazingSmith want her to come to our coumadin clinic or have adv home care check it.

## 2016-07-24 ENCOUNTER — Ambulatory Visit (INDEPENDENT_AMBULATORY_CARE_PROVIDER_SITE_OTHER): Payer: Medicare Other | Admitting: *Deleted

## 2016-07-24 DIAGNOSIS — Z7901 Long term (current) use of anticoagulants: Secondary | ICD-10-CM | POA: Diagnosis not present

## 2016-07-24 DIAGNOSIS — I480 Paroxysmal atrial fibrillation: Secondary | ICD-10-CM

## 2016-07-24 DIAGNOSIS — I639 Cerebral infarction, unspecified: Secondary | ICD-10-CM

## 2016-07-24 DIAGNOSIS — I69391 Dysphagia following cerebral infarction: Secondary | ICD-10-CM | POA: Diagnosis not present

## 2016-07-24 DIAGNOSIS — R1312 Dysphagia, oropharyngeal phase: Secondary | ICD-10-CM | POA: Diagnosis not present

## 2016-07-24 DIAGNOSIS — I69351 Hemiplegia and hemiparesis following cerebral infarction affecting right dominant side: Secondary | ICD-10-CM

## 2016-07-24 DIAGNOSIS — M6281 Muscle weakness (generalized): Secondary | ICD-10-CM | POA: Diagnosis not present

## 2016-07-24 DIAGNOSIS — N185 Chronic kidney disease, stage 5: Secondary | ICD-10-CM | POA: Diagnosis not present

## 2016-07-24 DIAGNOSIS — E1121 Type 2 diabetes mellitus with diabetic nephropathy: Secondary | ICD-10-CM | POA: Diagnosis not present

## 2016-07-24 LAB — POCT INR: INR: 2.2

## 2016-07-24 NOTE — Patient Instructions (Signed)

## 2016-07-25 ENCOUNTER — Encounter: Payer: Self-pay | Admitting: *Deleted

## 2016-07-30 ENCOUNTER — Other Ambulatory Visit: Payer: Self-pay | Admitting: *Deleted

## 2016-07-30 MED ORDER — ISOSORBIDE MONONITRATE ER 30 MG PO TB24: 30.0000 mg | ORAL_TABLET | Freq: Every day | ORAL | 3 refills | Status: DC

## 2016-07-30 MED ORDER — WARFARIN SODIUM 6 MG PO TABS: ORAL_TABLET | ORAL | 0 refills | Status: DC

## 2016-07-30 NOTE — Telephone Encounter (Signed)
Refill done as requested 

## 2016-07-31 ENCOUNTER — Ambulatory Visit (INDEPENDENT_AMBULATORY_CARE_PROVIDER_SITE_OTHER): Payer: Medicare Other | Admitting: Interventional Cardiology

## 2016-07-31 DIAGNOSIS — Z7901 Long term (current) use of anticoagulants: Secondary | ICD-10-CM

## 2016-07-31 DIAGNOSIS — I639 Cerebral infarction, unspecified: Secondary | ICD-10-CM

## 2016-07-31 DIAGNOSIS — I480 Paroxysmal atrial fibrillation: Secondary | ICD-10-CM

## 2016-07-31 LAB — POCT INR: INR: 4

## 2016-08-07 ENCOUNTER — Ambulatory Visit (INDEPENDENT_AMBULATORY_CARE_PROVIDER_SITE_OTHER): Payer: Medicare Other

## 2016-08-07 DIAGNOSIS — I480 Paroxysmal atrial fibrillation: Secondary | ICD-10-CM

## 2016-08-07 DIAGNOSIS — Z7901 Long term (current) use of anticoagulants: Secondary | ICD-10-CM

## 2016-08-07 DIAGNOSIS — I639 Cerebral infarction, unspecified: Secondary | ICD-10-CM

## 2016-08-07 LAB — POCT INR: INR: 3.5

## 2016-08-09 ENCOUNTER — Other Ambulatory Visit (HOSPITAL_COMMUNITY): Payer: Self-pay | Admitting: *Deleted

## 2016-08-10 ENCOUNTER — Encounter (HOSPITAL_COMMUNITY): Payer: Medicare Other

## 2016-08-10 ENCOUNTER — Encounter (HOSPITAL_COMMUNITY)
Admission: RE | Admit: 2016-08-10 | Discharge: 2016-08-10 | Disposition: A | Discharge status: Home / Self Care | Payer: Medicare Other | Source: Ambulatory Visit | Attending: Nephrology | Admitting: Nephrology

## 2016-08-10 DIAGNOSIS — N183 Chronic kidney disease, stage 3 (moderate): Secondary | ICD-10-CM | POA: Insufficient documentation

## 2016-08-10 DIAGNOSIS — D631 Anemia in chronic kidney disease: Secondary | ICD-10-CM | POA: Diagnosis not present

## 2016-08-10 DIAGNOSIS — N185 Chronic kidney disease, stage 5: Secondary | ICD-10-CM

## 2016-08-10 MED ORDER — DARBEPOETIN ALFA 40 MCG/0.4ML IJ SOSY
PREFILLED_SYRINGE | INTRAMUSCULAR | Status: AC
  Filled 2016-08-10: qty 0.4

## 2016-08-10 MED ORDER — DARBEPOETIN ALFA 40 MCG/0.4ML IJ SOSY
40.0000 ug | PREFILLED_SYRINGE | INTRAMUSCULAR | Status: DC
  Administered 2016-08-10: 40 ug via SUBCUTANEOUS

## 2016-08-14 ENCOUNTER — Ambulatory Visit (INDEPENDENT_AMBULATORY_CARE_PROVIDER_SITE_OTHER): Payer: Medicare Other

## 2016-08-14 ENCOUNTER — Encounter (INDEPENDENT_AMBULATORY_CARE_PROVIDER_SITE_OTHER): Payer: Self-pay

## 2016-08-14 ENCOUNTER — Ambulatory Visit (INDEPENDENT_AMBULATORY_CARE_PROVIDER_SITE_OTHER): Payer: Medicare Other | Admitting: Nurse Practitioner

## 2016-08-14 ENCOUNTER — Encounter: Payer: Self-pay | Admitting: Nurse Practitioner

## 2016-08-14 VITALS — BP 100/60 | HR 60 | Ht 59.0 in | Wt 174.0 lb

## 2016-08-14 DIAGNOSIS — I10 Essential (primary) hypertension: Secondary | ICD-10-CM

## 2016-08-14 DIAGNOSIS — Z7901 Long term (current) use of anticoagulants: Secondary | ICD-10-CM

## 2016-08-14 DIAGNOSIS — Z950 Presence of cardiac pacemaker: Secondary | ICD-10-CM | POA: Diagnosis not present

## 2016-08-14 DIAGNOSIS — I480 Paroxysmal atrial fibrillation: Secondary | ICD-10-CM

## 2016-08-14 DIAGNOSIS — I639 Cerebral infarction, unspecified: Secondary | ICD-10-CM

## 2016-08-14 DIAGNOSIS — N185 Chronic kidney disease, stage 5: Secondary | ICD-10-CM

## 2016-08-14 LAB — POCT INR: INR: 2.5

## 2016-08-14 NOTE — Patient Instructions (Addendum)
We will be checking the following labs today - BMET   Medication Instructions:    Continue with your current medicines.     Testing/Procedures To Be Arranged:  N/A  Follow-Up:   See me or Dr. Katrinka BlazingSmith in 3 months    Other Special Instructions:   Knee high support stockings if possible  Try to elevate the legs  Restrict salt as best you can.    If you need a refill on your cardiac medications before your next appointment, please call your pharmacy.   Call the [facility] office at [phone] if you have any questions, problems or concerns.

## 2016-08-14 NOTE — Progress Notes (Signed)
CARDIOLOGY OFFICE NOTE  Date:  08/14/2016    Dorothy Bartlett Date of Birth: 1926/06/05 Medical Record #[id number]  PCP:  Dorothy Nine, MD  Cardiologist:  Dorothy Bartlett   Chief Complaint  Patient presents with  . Follow-up    Post hospital - post stroke/CHF/respiratory failure/PAF - seen for Dr. Katrinka Bartlett    History of Present Illness: Dorothy Bartlett is a 82 y.o. female who presents today for a post hospital visit. Seen for Dr. Benay Bartlett.   She has a medical history significant of hyperlipidemia, hypertension, DM 2, multivessel CAD, chronic combined systolic and diastolic heart failure, permanent pacemaker for high degree AV block, PVD,obesity, intrinsic asthma, & stage V chronic kidney disease.   Last seen by Dr. Katrinka Bartlett and Dr. Graciela Bartlett in June - felt to be stable but noted to have progressive dyspnea.   Taken to the ER at the end of June after being found by her daughter on the front porch with right-sided facial droop, drooling and slurred speech and right-sided upper extremity paresis. Code stroke was activated. Imaging did not show acute stroke. Admitted to stepdown due to acute respiratory failure - required BiPAP. Neurology consulting for stroke workup. Nephrology consulted.  Coumadin initiated on 7/3 for suspected embolic stroke after extensive discussion with neurology, primary cardiologist, patient and daughter.  Comes in today. Here with her daughter today. She is in a wheelchair. Says she has recovered fairly well. She spent time at rehab. Now home with home health. Coumadin being checked probably one more time and then will be coming here. Her shortness of breath is back to her baseline. No chest pain. Some swelling in her legs/feet. Probably getting too much salt. Hard to wear support stockings. Does not elevate her legs. Just got started back on Aranesp - apparently her HGB was down to 8 when checked by Dr. Hyman Bartlett.No falls but "shakey". No active bleeding/bruising  noted.  Her weight is back to where she was when seen here back in June. Having more issues with taking her   Past Medical History:  Diagnosis Date  . 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 Carotid stenosis    Carotid US (12/2013): < 60% RCA, < 40% LICA.  [name] Chronic combined systolic and diastolic heart failure (HCC) 04/13/2014  . Chronic kidney disease (CKD), stage V (HCC)    notes 07/03/2015; Dr. Hyman Bartlett  . Complete heart block (HCC)    a. 05/2015 s/p [facility] Assurity DR model [id number] dual chamber PPM (serial number [id number]) (Dr. Johney Bartlett).  . Coronary artery disease    a. s/p MI 1995 tx w/ PTCA to Dx, Ramus, ap LAD;  b. LHC (11/2004):  Inferior HK, EF 60%, proximal LAD 50%, ostial D1 80%, mid D1 95%, mid LAD 50-60%, distal LAD at the apex 90%, OM1 occluded (fills late by left to left collaterals), RCA 75%. Medical therapy recommended.;  c. Lexiscan Myoview (12/2013):  Prior ant-lat scar with very small area of peri-infarct ischemia in ant wall, EF 40 (high risk)   . Hx of echocardiogram    a. Echo 5/16: dist septal, dist ant, dist inf and apical AK, EF 40-45%, mod MR, mild LAE, PASP 72  . Hyperlipidemia   . Hypertension   . Intrinsic asthma 01/28/2008    Mild intermittant asthma, PFT's 01/28/08     . Obesity (BMI 30-39.9)   . Old anterolateral myocardial infarction   . Presence of permanent cardiac pacemaker   .  PVD (peripheral vascular disease) (HCC) 02/16/2014   2015, right femoral peroneal bypass grafting and left femoral-tibial bypass grafting by Dr. Arbie Bartlett for rest pain as well as nonhealing ulcer on toe accompanied with toe amputation  Arteriogram October 2015 with patent graft on the right, 70% stenosis of graft on the left proximally.   . Type 2 diabetes mellitus with renal manifestations [facility])     Past Surgical History:  Procedure Laterality Date  . ABDOMINAL ANGIOGRAM  01/08/2014   Procedure: ABDOMINAL ANGIOGRAM;  Surgeon: Dorothy Kerns,  MD;  Location: [facility] CATH LAB;  Service: Cardiovascular;;  . ABDOMINAL AORTAGRAM N/A 10/25/2014   Procedure: ABDOMINAL Ronny Flurry;  Surgeon: Dorothy Hint, MD;  [facility] C[facility] LAB;  Service: Cardiovascular;  Laterality: N/A;  . AMPUTATION Right 08/04/2014   Procedure: AMPUTATION DIGIT-RIGHT 3RD TOE;  Surgeon: Dorothy Earthly, MD;  Location: [facility] OR;  Service: Vascular;  Laterality: Right;  . CARDIAC CATHETERIZATION  1995   w/ PTCA Diag 2nd MI  . EP IMPLANTABLE DEVICE N/A 05/10/2015   Procedure: Pacemaker Implant;  Surgeon: Dorothy Range, MD;  Location: [facility] INVASIVE CV LAB;  Service: Cardiovascular;  Laterality: N/A;  . EP IMPLANTABLE DEVICE N/A 06/13/2015   Procedure: Lead Revision/Repair;  Surgeon: Dorothy Salvia, MD;  Location: [facility] INVASIVE CV LAB;  Service: Cardiovascular;  Laterality: N/A;  . FEMORAL-POPLITEAL BYPASS GRAFT Right 08/04/2014   Procedure: RIGHT FEMORAL-PERONEAL ARTERY BYPASS GRAFT;  Surgeon: Dorothy Earthly, MD;  Location: [facility] OR;  Service: Vascular;  Laterality: Right;  . FEMORAL-TIBIAL BYPASS GRAFT Left 02/24/2014   Procedure: BYPASS GRAFT FEMORAL-TIBIAL ARTERY;  Surgeon: Dorothy Earthly, MD;  Location: [facility] OR;  Service: Vascular;  Laterality: Left;  . INSERT / REPLACE / REMOVE PACEMAKER    . LOWER EXTREMITY ANGIOGRAM Left 01/08/2014   Procedure: LOWER EXTREMITY ANGIOGRAM;  Surgeon: Dorothy Kerns, MD;  Location: [facility] CATH LAB;  Service: Cardiovascular;  Laterality: Left;  . LOWER EXTREMITY ANGIOGRAM Right 08/03/2014   Procedure: LOWER EXTREMITY ANGIOGRAM;  Surgeon: Dorothy Libman, MD;  Location: [facility] CATH LAB;  Service: Cardiovascular;  Laterality: Right;  . PACEMAKER LEAD REMOVAL  06/13/2015   Procedure: Pacemaker Lead Removal;  Surgeon: Dorothy Salvia, MD;  Location: [facility] INVASIVE CV LAB;  Service: Cardiovascular;;  . repair of nerve in left arm  Left 2000     Medications: Current Outpatient Prescriptions  Medication Sig Dispense Refill  . albuterol (PROVENTIL HFA;VENTOLIN HFA) 108 (90  BASE) MCG/ACT inhaler Inhale 2 puffs into the lungs every 6 (six) hours as needed for wheezing or shortness of breath.    [name] atorvastatin (LIPITOR) 80 MG tablet Take 1 tablet (80 mg total) by mouth every morning. 30 tablet 0  . B Complex-C-Folic Acid (RENA-VITE RX) 1 MG TABS Take 1 tablet by mouth every morning.     . carvedilol (COREG) 12.5 MG tablet Take 1 tablet (12.5 mg total) by mouth 2 (two) times daily. 180 tablet 1  . docusate sodium (COLACE) 100 MG capsule Take 100 mg by mouth 2 (two) times daily.     . furosemide (LASIX) 80 MG tablet Take 1 tablet (80 mg total) by mouth 2 (two) times daily. 60 tablet 0  . hydrALAZINE (APRESOLINE) 10 MG tablet Take 1 tablet (10 mg total) by mouth 3 (three) times daily. 270 tablet 3  . isosorbide mononitrate (IMDUR) 30 MG 24 hr tablet Take 1 tablet (30 mg total) by mouth daily. 90 tablet 3  . JANUVIA 25 MG tablet Take  25 mg by mouth daily.    [name]. loratadine (CLARITIN) 10 MG tablet Take 10 mg by mouth daily as needed for allergies.    . Melatonin 3 MG TABS Take 3 mg by mouth at bedtime as needed (for sleep).     . nitroGLYCERIN (NITROSTAT) 0.4 MG SL tablet Place 1 tablet (0.4 mg total) under the tongue every 5 (five) minutes x 3 doses as needed for chest pain. 25 tablet 3  . paricalcitol (ZEMPLAR) 1 MCG capsule Take 1 mcg by mouth daily.     . potassium chloride SA (K-DUR,KLOR-CON) 20 MEQ tablet Take 1 tablet (20 mEq total) by mouth once. 15 tablet 0  . Vitamin D, Ergocalciferol, (DRISDOL) 50000 UNITS CAPS capsule Take 1 capsule (50,000 Units total) by mouth every 7 (seven) days. 10 capsule 0  . warfarin (COUMADIN) 6 MG tablet Take as directed by coumadin clinic 35 tablet 0   No current facility-administered medications for this visit.     Allergies: No Known Allergies  Social History: The patient  reports that she has never smoked. She has never used smokeless tobacco. She reports that she does not drink alcohol or use drugs.   Family History: The  patient's family history includes Diabetes in her father; Hyperlipidemia in her daughter and son; Hypertension in her daughter, father, and sister.   Review of Systems: Please see the history of present illness.   Otherwise, the review of systems is positive for none.   All other systems are reviewed and negative.   Physical Exam: VS:  BP 100/60   Pulse 60   Ht 4\' 11"  (1.499 m)   Wt 174 lb (78.9 kg)   SpO2 99% Comment: at rest  BMI 35.14 kg/m  .  BMI Body mass index is 35.14 kg/m.  Wt Readings from Last 3 Encounters:  08/14/16 174 lb (78.9 kg)  07/19/16 162 lb 12.8 oz (73.8 kg)  07/17/16 176 lb 12.8 oz (80.2 kg)    General: Pleasant. Elderly black female who looks chronically ill but alert and in no acute distress.   HEENT: Normal.  Neck: Supple, no JVD, carotid bruits, or masses noted.  Cardiac: Regular rate and rhythm. Heart tones are distant. +1 edema.  Respiratory:  Lungs are clear to auscultation bilaterally with normal work of breathing.  GI: Soft and nontender.  MS: No deformity or atrophy. Gait not tested Skin: Warm and dry. Color is normal.  Neuro:  Strength and sensation are intact and no gross focal deficits noted.  Psych: Alert, appropriate and with normal affect.   LABORATORY DATA:  EKG:  EKG is not ordered today.  Lab Results  Component Value Date   WBC 6.2 07/09/2016   HGB 9.7 (A) 07/09/2016   HCT 32 (A) 07/09/2016   PLT 167 07/09/2016   GLUCOSE 136 (H) 07/04/2016   CHOL 156 06/30/2016   TRIG 120 06/30/2016   HDL 49 06/30/2016   LDLCALC 83 06/30/2016   ALT 28 07/09/2016   AST 31 07/09/2016   NA 141 07/09/2016   K 4.0 07/09/2016   CL 96 (L) 07/04/2016   CREATININE 4.7 (A) 07/09/2016   BUN 69 (A) 07/09/2016   CO2 28 07/04/2016   TSH 2.772 05/08/2015   INR 3.5 08/07/2016   HGBA1C 5.4 06/30/2016   Lab Results  Component Value Date   INR 3.5 08/07/2016   INR 4.0 07/31/2016   INR 2.2 07/24/2016     BNP (last 3 results)  Recent Labs  06/29/16 2237  BNP 4,002.8*    ProBNP (last 3 results) No results for input(s): PROBNP in the last 8760 hours.   Other Studies Reviewed Today:  Echo Study Conclusions from 06/2016  - Left ventricle: The cavity size was normal. Wall thickness was   normal. Systolic function was normal. The estimated ejection   fraction was in the Bartlett of 60% to 65%. Wall motion was normal;   there were no regional wall motion abnormalities. Doppler   parameters are consistent diastolic dysfunction. The E/e&' ratio   is >20, suggesting markedly elevated LV filling pressure. - Aortic valve: Trileaflet. Sclerotic leaflet tips without   stenosis. There was no regurgitation. - Mitral valve: Calcified annulus. Mildly thickened leaflets .   There was mild to moderate regurgitation. - Left atrium: Moderately dilated. - Right ventricle: The cavity size was normal. Wall thickness was   normal. The moderator band was prominent. - Right atrium: The atrium was at the upper limits of normal in   size. - Tricuspid valve: There was moderate regurgitation. - Pulmonic valve: Poorly visualized. There was mild regurgitation. - Pulmonary arteries: Dilated. PA peak pressure: 62 mm Hg (S). - Inferior vena cava: The vessel was normal in size. The   respirophasic diameter changes were in the normal Bartlett (>= 50%),   consistent with normal central venous pressure.  Impressions:  - Compared to a prior echo in 2016, the LVEF is higher at 60-65%.   The estimated PA pressure is lower in this study, however, left   and right heart pressures are elevated.  Assessment/Plan:  Left brain stroke - Residual dysarthria, facial asymmetry and right hemiparesis. Was not able to have MRI due to PPM in place. Device check showed PAF - thus Plavix changed to Coumadin. She seems to be recovering nicely.   Acute respiratory failure with hypoxia - Precipitated by acute on chronic combined CHF, underlying severe pulmonary  hypertension and asthma. Improved at discharge. Appears to be back to her baseline.   Chronic combined CHF - discharged on diuretic. On more Coreg as well. Needs potassium rechecked today. Having issues with swallowing that one pill. Her volume status looks fairly stable. Weight is back to where she was in June.   Chronic stage V chronic kidney disease Followed by [facility]  Hyperlipidemia LDL 83. Lipitor increased to 80 mg daily from 40 MG daily.  CAD with remote PCI- Managed medically. Dr. Katrinka Bartlett has advised that once patient therapeutic on Coumadin, no need to continue antiplatelets and Plavix may be discontinued.  Essential hypertension - BP little soft - no real symptoms - would follow for now.   Right ICA stenosis >75%.  Right lung abnormalities - Needs Follow-up CT chest in 3 months - due end of September/early October - discussed with Dr. Katrinka Bartlett today - would not plan on any treatment for this therefore, will not plan on repeating.  - This changes were seen on CTA neck. Bilateral pleural effusions greater on right. Lung parenchymal changes partially reflective of pulmonary vascular congestion however, groundglass opacities throughout the right lung apex including 1.4 cm rounded consolidation. This appearance is atypical for congestive heart and hence follow-up CT recommended by radiology.  PPM - followed by Dr. Graciela Bartlett  PAF - managed with rate control and now on coumadin. So far no problems noted but overall course tenuous at best.   Current medicines are reviewed with the patient today.  The patient does not have concerns regarding medicines other than what has been  noted above.  The following changes have been made:  See above.  Labs/ tests ordered today include:   No orders of the defined types were placed in this encounter.    Disposition:   FU with me or Dr. Katrinka BlazingSmith in 3 months. See Dr. Graciela HusbandsKlein as planned.   Patient is agreeable to this plan and will call if  any problems develop in the interim.   Signed: Rosalio MacadamiaLori C. Danial Sisley, RN, ANP-C 08/14/2016 10:08 AM  [facility] [address] [place], [place]  [address] Phone: [phone] Fax: [phone]

## 2016-08-15 ENCOUNTER — Telehealth: Payer: Self-pay | Admitting: Interventional Cardiology

## 2016-08-15 ENCOUNTER — Encounter: Payer: Self-pay | Admitting: Interventional Cardiology

## 2016-08-15 NOTE — Telephone Encounter (Signed)
Call to [name]. Pt was seen yesterday by Sunday SpillersLori Gerhardt,NP and everything was stable. Harriett Sineancy sts that the pt has had a non-productive cough for the a couple of weeks, that she forgot to mention at the pt o/v yesterday. Pt does not have a a fever and the cough is not productive. Karen KaysAdv Nancy that I have spoken with Lawson FiscalLori who suggest trying otc plain Robitussin w/o the DM. Harriett Sineancy is agreeable and will purchase it today. Recommended she f/u with her pcp if cough does not improve. Adv her to call back if swelling, sob, weight gain develop. Harriett Sineancy verbalized understanding.

## 2016-08-15 NOTE — Telephone Encounter (Signed)
New message   Pt daughter verbalized that she needs to know if Dr.Smith received the fax from [facility] that has the pt labs results from last week  Pt daughter said that pt has a horrible cough and she seems that she may choke and she wants to know what she should do

## 2016-08-17 ENCOUNTER — Encounter (HOSPITAL_COMMUNITY): Payer: Self-pay | Admitting: *Deleted

## 2016-08-17 ENCOUNTER — Other Ambulatory Visit: Payer: Self-pay | Admitting: Internal Medicine

## 2016-08-17 ENCOUNTER — Emergency Department (HOSPITAL_COMMUNITY): Payer: Medicare Other

## 2016-08-17 ENCOUNTER — Inpatient Hospital Stay (HOSPITAL_COMMUNITY)
Admission: EM | Admit: 2016-08-17 | Discharge: 2016-08-23 | DRG: 291 | Disposition: A | Discharge status: Hospice | Payer: Medicare Other | Attending: Internal Medicine | Admitting: Internal Medicine

## 2016-08-17 DIAGNOSIS — D631 Anemia in chronic kidney disease: Secondary | ICD-10-CM | POA: Diagnosis present

## 2016-08-17 DIAGNOSIS — Z515 Encounter for palliative care: Secondary | ICD-10-CM | POA: Diagnosis not present

## 2016-08-17 DIAGNOSIS — N184 Chronic kidney disease, stage 4 (severe): Secondary | ICD-10-CM | POA: Diagnosis present

## 2016-08-17 DIAGNOSIS — Z7984 Long term (current) use of oral hypoglycemic drugs: Secondary | ICD-10-CM

## 2016-08-17 DIAGNOSIS — I132 Hypertensive heart and chronic kidney disease with heart failure and with stage 5 chronic kidney disease, or end stage renal disease: Secondary | ICD-10-CM | POA: Diagnosis not present

## 2016-08-17 DIAGNOSIS — Z6835 Body mass index (BMI) 35.0-35.9, adult: Secondary | ICD-10-CM

## 2016-08-17 DIAGNOSIS — J9610 Chronic respiratory failure, unspecified whether with hypoxia or hypercapnia: Secondary | ICD-10-CM | POA: Diagnosis present

## 2016-08-17 DIAGNOSIS — E1151 Type 2 diabetes mellitus with diabetic peripheral angiopathy without gangrene: Secondary | ICD-10-CM | POA: Diagnosis present

## 2016-08-17 DIAGNOSIS — Z9861 Coronary angioplasty status: Secondary | ICD-10-CM

## 2016-08-17 DIAGNOSIS — N2581 Secondary hyperparathyroidism of renal origin: Secondary | ICD-10-CM | POA: Diagnosis present

## 2016-08-17 DIAGNOSIS — I252 Old myocardial infarction: Secondary | ICD-10-CM

## 2016-08-17 DIAGNOSIS — N039 Chronic nephritic syndrome with unspecified morphologic changes: Secondary | ICD-10-CM | POA: Diagnosis not present

## 2016-08-17 DIAGNOSIS — E1122 Type 2 diabetes mellitus with diabetic chronic kidney disease: Secondary | ICD-10-CM | POA: Diagnosis present

## 2016-08-17 DIAGNOSIS — N179 Acute kidney failure, unspecified: Secondary | ICD-10-CM | POA: Diagnosis present

## 2016-08-17 DIAGNOSIS — I429 Cardiomyopathy, unspecified: Secondary | ICD-10-CM | POA: Diagnosis present

## 2016-08-17 DIAGNOSIS — J189 Pneumonia, unspecified organism: Secondary | ICD-10-CM | POA: Diagnosis present

## 2016-08-17 DIAGNOSIS — I5043 Acute on chronic combined systolic (congestive) and diastolic (congestive) heart failure: Secondary | ICD-10-CM | POA: Diagnosis present

## 2016-08-17 DIAGNOSIS — Z7901 Long term (current) use of anticoagulants: Secondary | ICD-10-CM | POA: Diagnosis not present

## 2016-08-17 DIAGNOSIS — I10 Essential (primary) hypertension: Secondary | ICD-10-CM | POA: Diagnosis not present

## 2016-08-17 DIAGNOSIS — N189 Chronic kidney disease, unspecified: Secondary | ICD-10-CM

## 2016-08-17 DIAGNOSIS — E785 Hyperlipidemia, unspecified: Secondary | ICD-10-CM | POA: Diagnosis present

## 2016-08-17 DIAGNOSIS — I480 Paroxysmal atrial fibrillation: Secondary | ICD-10-CM | POA: Diagnosis present

## 2016-08-17 DIAGNOSIS — I639 Cerebral infarction, unspecified: Secondary | ICD-10-CM | POA: Diagnosis present

## 2016-08-17 DIAGNOSIS — R0602 Shortness of breath: Secondary | ICD-10-CM | POA: Diagnosis present

## 2016-08-17 DIAGNOSIS — E872 Acidosis: Secondary | ICD-10-CM | POA: Diagnosis present

## 2016-08-17 DIAGNOSIS — I2510 Atherosclerotic heart disease of native coronary artery without angina pectoris: Secondary | ICD-10-CM | POA: Diagnosis present

## 2016-08-17 DIAGNOSIS — Z8673 Personal history of transient ischemic attack (TIA), and cerebral infarction without residual deficits: Secondary | ICD-10-CM

## 2016-08-17 DIAGNOSIS — Z950 Presence of cardiac pacemaker: Secondary | ICD-10-CM

## 2016-08-17 DIAGNOSIS — E1129 Type 2 diabetes mellitus with other diabetic kidney complication: Secondary | ICD-10-CM | POA: Diagnosis present

## 2016-08-17 DIAGNOSIS — Z66 Do not resuscitate: Secondary | ICD-10-CM | POA: Diagnosis present

## 2016-08-17 HISTORY — DX: Heart failure, unspecified: I50.9

## 2016-08-17 HISTORY — DX: Cerebral infarction, unspecified: I63.9

## 2016-08-17 LAB — COMPREHENSIVE METABOLIC PANEL
ALBUMIN: 3.9 g/dL (ref 3.5–5.0)
ALK PHOS: 58 U/L (ref 38–126)
ALT: 48 U/L (ref 14–54)
AST: 35 U/L (ref 15–41)
Anion gap: 12 (ref 5–15)
BILIRUBIN TOTAL: 0.8 mg/dL (ref 0.3–1.2)
BUN: 103 mg/dL — AB (ref 6–20)
CALCIUM: 9.6 mg/dL (ref 8.9–10.3)
CO2: 21 mmol/L — ABNORMAL LOW (ref 22–32)
CREATININE: 5.73 mg/dL — AB (ref 0.44–1.00)
Chloride: 102 mmol/L (ref 101–111)
GFR calc Af Amer: 7 mL/min — ABNORMAL LOW (ref >60)
GFR, EST NON AFRICAN AMERICAN: 6 mL/min — AB (ref >60)
GLUCOSE: 162 mg/dL — AB (ref 65–99)
POTASSIUM: 4.4 mmol/L (ref 3.5–5.1)
Sodium: 135 mmol/L (ref 135–145)
TOTAL PROTEIN: 6.9 g/dL (ref 6.5–8.1)

## 2016-08-17 LAB — URINALYSIS, ROUTINE W REFLEX MICROSCOPIC
BILIRUBIN URINE: NEGATIVE
GLUCOSE, UA: NEGATIVE mg/dL
HGB URINE DIPSTICK: NEGATIVE
KETONES UR: NEGATIVE mg/dL
Leukocytes, UA: NEGATIVE
Nitrite: NEGATIVE
PROTEIN: NEGATIVE mg/dL
Specific Gravity, Urine: 1.016 (ref 1.005–1.030)
pH: 5 (ref 5.0–8.0)

## 2016-08-17 LAB — CBC WITH DIFFERENTIAL/PLATELET
BASOS ABS: 0 10*3/uL (ref 0.0–0.1)
BASOS PCT: 0 %
EOS PCT: 1 %
Eosinophils Absolute: 0.1 10*3/uL (ref 0.0–0.7)
HEMATOCRIT: 26.5 % — AB (ref 36.0–46.0)
Hemoglobin: 8 g/dL — ABNORMAL LOW (ref 12.0–15.0)
Lymphocytes Relative: 15 %
Lymphs Abs: 0.9 10*3/uL (ref 0.7–4.0)
MCH: 30.1 pg (ref 26.0–34.0)
MCHC: 30.2 g/dL (ref 30.0–36.0)
MCV: 99.6 fL (ref 78.0–100.0)
MONO ABS: 0.7 10*3/uL (ref 0.1–1.0)
MONOS PCT: 10 %
NEUTROS ABS: 4.8 10*3/uL (ref 1.7–7.7)
Neutrophils Relative %: 74 %
PLATELETS: 196 10*3/uL (ref 150–400)
RBC: 2.66 MIL/uL — ABNORMAL LOW (ref 3.87–5.11)
RDW: 15.5 % (ref 11.5–15.5)
WBC: 6.4 10*3/uL (ref 4.0–10.5)

## 2016-08-17 LAB — I-STAT CG4 LACTIC ACID, ED: LACTIC ACID, VENOUS: 1.05 mmol/L (ref 0.5–1.9)

## 2016-08-17 LAB — I-STAT CHEM 8, ED
BUN: 108 mg/dL — AB (ref 6–20)
CHLORIDE: 100 mmol/L — AB (ref 101–111)
Calcium, Ion: 1.2 mmol/L (ref 1.12–1.23)
Creatinine, Ser: 5.7 mg/dL — ABNORMAL HIGH (ref 0.44–1.00)
GLUCOSE: 159 mg/dL — AB (ref 65–99)
HCT: 27 % — ABNORMAL LOW (ref 36.0–46.0)
Hemoglobin: 9.2 g/dL — ABNORMAL LOW (ref 12.0–15.0)
POTASSIUM: 4.4 mmol/L (ref 3.5–5.1)
SODIUM: 137 mmol/L (ref 135–145)
TCO2: 24 mmol/L (ref 0–100)

## 2016-08-17 LAB — GLUCOSE, CAPILLARY: Glucose-Capillary: 167 mg/dL — ABNORMAL HIGH (ref 65–99)

## 2016-08-17 LAB — PROTIME-INR
INR: 2.73
Prothrombin Time: 29.5 seconds — ABNORMAL HIGH (ref 11.4–15.2)

## 2016-08-17 LAB — I-STAT TROPONIN, ED: Troponin i, poc: 0.02 ng/mL (ref 0.00–0.08)

## 2016-08-17 LAB — BRAIN NATRIURETIC PEPTIDE: B NATRIURETIC PEPTIDE 5: 4317.4 pg/mL — AB (ref 0.0–100.0)

## 2016-08-17 LAB — TROPONIN I: TROPONIN I: 0.05 ng/mL — AB (ref <0.03)

## 2016-08-17 MED ORDER — SODIUM CHLORIDE 0.9 % IV BOLUS (SEPSIS)
500.0000 mL | Freq: Once | INTRAVENOUS | Status: AC
  Administered 2016-08-17: 500 mL via INTRAVENOUS

## 2016-08-17 MED ORDER — RENA-VITE PO TABS
1.0000 | ORAL_TABLET | Freq: Every morning | ORAL | Status: DC
  Administered 2016-08-18 – 2016-08-23 (×6): 1 via ORAL
  Filled 2016-08-17 (×6): qty 1

## 2016-08-17 MED ORDER — DOXYCYCLINE HYCLATE 100 MG PO TABS
100.0000 mg | ORAL_TABLET | Freq: Two times a day (BID) | ORAL | Status: DC
  Administered 2016-08-17 – 2016-08-18 (×2): 100 mg via ORAL
  Filled 2016-08-17 (×2): qty 1

## 2016-08-17 MED ORDER — ATORVASTATIN CALCIUM 80 MG PO TABS
80.0000 mg | ORAL_TABLET | Freq: Every morning | ORAL | Status: DC
  Administered 2016-08-18 – 2016-08-23 (×6): 80 mg via ORAL
  Filled 2016-08-17 (×6): qty 1

## 2016-08-17 MED ORDER — SODIUM CHLORIDE 0.9% FLUSH: 3.0000 mL | INTRAVENOUS | Status: DC | PRN

## 2016-08-17 MED ORDER — ONDANSETRON HCL 4 MG/2ML IJ SOLN: 4.0000 mg | Freq: Four times a day (QID) | INTRAMUSCULAR | Status: DC | PRN

## 2016-08-17 MED ORDER — WARFARIN SODIUM 3 MG PO TABS
6.0000 mg | ORAL_TABLET | Freq: Every day | ORAL | Status: AC
  Administered 2016-08-18: 6 mg via ORAL
  Filled 2016-08-17: qty 2

## 2016-08-17 MED ORDER — CARVEDILOL 12.5 MG PO TABS
12.5000 mg | ORAL_TABLET | Freq: Two times a day (BID) | ORAL | Status: DC
  Administered 2016-08-18 – 2016-08-23 (×11): 12.5 mg via ORAL
  Filled 2016-08-17 (×11): qty 1

## 2016-08-17 MED ORDER — WARFARIN - PHARMACIST DOSING INPATIENT
Freq: Every day | Status: DC
  Administered 2016-08-18 – 2016-08-22 (×4)

## 2016-08-17 MED ORDER — SODIUM CHLORIDE 0.9 % IV SOLN: 250.0000 mL | INTRAVENOUS | Status: DC | PRN

## 2016-08-17 MED ORDER — SODIUM CHLORIDE 0.9% FLUSH
3.0000 mL | Freq: Two times a day (BID) | INTRAVENOUS | Status: DC
  Administered 2016-08-17 – 2016-08-23 (×11): 3 mL via INTRAVENOUS

## 2016-08-17 MED ORDER — PARICALCITOL 1 MCG PO CAPS
1.0000 ug | ORAL_CAPSULE | Freq: Every day | ORAL | Status: DC
  Administered 2016-08-17 – 2016-08-23 (×7): 1 ug via ORAL
  Filled 2016-08-17 (×7): qty 1

## 2016-08-17 MED ORDER — ACETAMINOPHEN 325 MG PO TABS
650.0000 mg | ORAL_TABLET | ORAL | Status: DC | PRN
Start: 2016-08-17 — End: 2016-08-23

## 2016-08-17 MED ORDER — GUAIFENESIN 100 MG/5ML PO SOLN: 10.0000 mL | ORAL | Status: DC | PRN

## 2016-08-17 MED ORDER — MELATONIN 3 MG PO TABS
3.0000 mg | ORAL_TABLET | Freq: Every evening | ORAL | Status: DC | PRN
  Filled 2016-08-17: qty 1

## 2016-08-17 MED ORDER — FUROSEMIDE 10 MG/ML IJ SOLN
80.0000 mg | Freq: Two times a day (BID) | INTRAMUSCULAR | Status: DC
Start: 2016-08-17 — End: 2016-08-19
  Administered 2016-08-17 – 2016-08-18 (×3): 80 mg via INTRAVENOUS
  Filled 2016-08-17 (×3): qty 8

## 2016-08-17 MED ORDER — ALBUTEROL SULFATE (2.5 MG/3ML) 0.083% IN NEBU
2.5000 mg | INHALATION_SOLUTION | Freq: Four times a day (QID) | RESPIRATORY_TRACT | Status: DC
  Administered 2016-08-17 – 2016-08-19 (×6): 2.5 mg via RESPIRATORY_TRACT
  Filled 2016-08-17 (×6): qty 3

## 2016-08-17 MED ORDER — DOCUSATE SODIUM 100 MG PO CAPS
100.0000 mg | ORAL_CAPSULE | Freq: Two times a day (BID) | ORAL | Status: DC
  Administered 2016-08-17 – 2016-08-23 (×11): 100 mg via ORAL
  Filled 2016-08-17 (×12): qty 1

## 2016-08-17 MED ORDER — INSULIN ASPART 100 UNIT/ML ~~LOC~~ SOLN
0.0000 [IU] | Freq: Three times a day (TID) | SUBCUTANEOUS | Status: DC
  Administered 2016-08-18 – 2016-08-21 (×3): 1 [IU] via SUBCUTANEOUS
  Administered 2016-08-22 – 2016-08-23 (×2): 2 [IU] via SUBCUTANEOUS

## 2016-08-17 MED ORDER — ISOSORBIDE MONONITRATE ER 30 MG PO TB24
30.0000 mg | ORAL_TABLET | Freq: Every day | ORAL | Status: DC
  Administered 2016-08-18 – 2016-08-23 (×6): 30 mg via ORAL
  Filled 2016-08-17 (×6): qty 1

## 2016-08-17 NOTE — Progress Notes (Signed)
Patient arrived to unit; patient is stable, will continue to monitor.

## 2016-08-17 NOTE — Progress Notes (Signed)
Nurse received report from ED nurse.

## 2016-08-17 NOTE — ED Notes (Signed)
Attempted to call report

## 2016-08-17 NOTE — ED Triage Notes (Signed)
Pt brought here by daughter for weakness, sob, increased LE edema and decreased urinary output.  Was dx yesterday with pneumonia and started taking doxy last night.

## 2016-08-17 NOTE — ED Provider Notes (Signed)
I saw and evaluated the patient, reviewed the resident's note and I agree with the findings and plan.  82 yo F w/ sob/ weakness for a couple days, cough for a week or so. Apparently with pneumonia and started on doxy yesterday. Difficulty walking and standing today and worsening cough so came in for eval.  Exam with LUL crackles, RLL crackles. On hom O2 with good saturations. Rest of vitals ok. Abdomen benign, no rashes.  Plan for workup, fluids, disposition appropriately.    EKG Interpretation  Date/Time:  Friday August 17 2016 14:32:04 EDT Ventricular Rate:  60 PR Interval:  152 QRS Duration: 210 QT Interval:  550 QTC Calculation: 550 R Axis:   -89 Text Interpretation:  Electronic atrial pacemaker No significant change since last tracing Confirmed by [facility] CenterMESNER MD, Barbara CowerJASON [phone]) on 08/17/2016 3:20:06 PM         Marily MemosJason Noe Goyer, MD 08/19/16 1151

## 2016-08-17 NOTE — Progress Notes (Signed)
ANTICOAGULATION CONSULT NOTE - Initial Consult  Pharmacy Consult for warfarin Indication: atrial fibrillation  No Known Allergies  Patient Measurements: Height: 4\' 11"  (149.9 cm) Weight: 174 lb (78.9 kg) IBW/kg (Calculated) : 43.2  Vital Signs: Temp: 97.9 F (36.6 C) (08/18 1644) Temp Source: Rectal (08/18 1644) BP: 109/60 (08/18 1600) Pulse Rate: 59 (08/18 1600)  Labs:  Recent Labs  08/17/16 1548 08/17/16 1602  HGB 8.0* 9.2*  HCT 26.5* 27.0*  PLT 196  --   LABPROT 29.5*  --   INR 2.73  --   CREATININE 5.73* 5.70*  TROPONINI 0.05*  --     Estimated Creatinine Clearance: 6.2 mL/min (by C-G formula based on SCr of 5.7 mg/dL).   Assessment: [age] YOF here with worsening SOB and weakness over the last week along with nonproductive cough. History of AFib on warfarin. Admission INR in range at 2.73, Hgb 8, plts 196- appears to be around her baseline.  Home dose of warfarin: 6mg  daily except 3mg  on Wednesdays and Saturdays.  Goal of Therapy:  INR 2-3 Monitor platelets by anticoagulation protocol: Yes   Plan:  -warfarin 6mg  po x1 tonight as per home dose -daily INR and CBC -follow for s/s bleeding  Josef Tourigny D. Trudi Morgenthaler, PharmD, BCPS Clinical Pharmacist Pager: [phone] 08/17/2016 7:08 PM

## 2016-08-17 NOTE — Progress Notes (Signed)
Called for report, ED nurse will call back.

## 2016-08-17 NOTE — ED Notes (Signed)
MD at bedside. 

## 2016-08-17 NOTE — ED Notes (Signed)
Patient's O2 sat 88% on room air, patient placed on 2L [place], O2 sat up to 100%

## 2016-08-17 NOTE — H&P (Signed)
TRH H&P   Patient Demographics:    Dorothy Bartlett, is a 82 y.o. female  MRN: [id number]   DOB - May 23, 1926  Admit Date - 08/17/2016  Outpatient Primary MD for the patient is Eliott Nine, MD  Referring MD/NP/PA: Dr. Rennis Chris  Outpatient Specialists: Renal Dr. Hyman Hopes, cardiology Dr. Katrinka Blazing  Patient coming from: Home  Chief Complaint  Patient presents with  . Shortness of Breath  . Weakness      HPI:    Dorothy Bartlett  is a 82 y.o. female, With past medical history of hyperlipidemia, hypertension, DM 2, multivessel CAD, chronic combined systolic and diastolic heart failure, permanent pacemaker for high degree AV block, PVD,obesity, intrinsic asthma, & stage V chronic kidney disease. , A. fib on warfarin for anticoagulation, recent admission for CVA thought to be secondary to A. Fib. Patient presents secondary to complaints of worsening shortness of breath over the last week, progressive weakness, cough, nonproductive, worsening lower extremity edema, as well reports orthopnea, reports chills, prescribed oral doxycycline by her PCP yesterday for presumed pneumonia, patient reports she is compliant with her medication, no excessive salt intake, she denies any chest pain, any coffee-ground emesis, any hemoptysis. - in ED workup significant for elevated BNP, volume overload on chest x-ray, worsening renal function, creatinine of 5.7, baseline is 4.9, EKG showing paced rhythm, patient admitted for CHF exacerbation   Review of systems:    In addition to the HPI above, No Fever-chills, No Headache, No changes with Vision or hearing, No problems swallowing food or Liquids, No Chest pain, Cough or Shortness of Breath, No Abdominal pain, No Nausea or Vommitting, Bowel movements are regular, No Blood in stool or Urine, No dysuria, No new skin rashes or bruises, No new joints  pains-aches,  No new weakness, tingling, numbness in any extremity, No recent weight gain or loss, No polyuria, polydypsia or polyphagia, No significant Mental Stressors.  A full 10 point Review of Systems was done, except as stated above, all other Review of Systems were negative.   With Past History of the following :    Past Medical History:  Diagnosis Date  . 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 Carotid stenosis    Carotid US (12/2013): < 60% RCA, < 40% LICA.  [name] CHF (congestive heart failure) (HCC)   . Chronic combined systolic and diastolic heart failure (HCC) 04/13/2014  . Chronic kidney disease (CKD), stage V (HCC)    notes 07/03/2015; Dr. Hyman Hopes  . Complete heart block (HCC)    a. 05/2015 s/p [facility] Assurity DR model [phone] dual chamber PPM (serial number [id number]) (Dr. Johney Frame).  . Coronary artery disease    a. s/p MI 1995 tx w/ PTCA to Dx, Ramus, ap LAD;  b. LHC (11/2004):  Inferior HK, EF 60%, proximal LAD 50%, ostial D1 80%, mid D1 95%, mid LAD 50-60%,  distal LAD at the apex 90%, OM1 occluded (fills late by left to left collaterals), RCA 75%. Medical therapy recommended.;  c. Lexiscan Myoview (12/2013):  Prior ant-lat scar with very small area of peri-infarct ischemia in ant wall, EF 40 (high risk)   . Hx of echocardiogram    a. Echo 5/16: dist septal, dist ant, dist inf and apical AK, EF 40-45%, mod MR, mild LAE, PASP 72  . Hyperlipidemia   . Hypertension   . Intrinsic asthma 01/28/2008    Mild intermittant asthma, PFT's 01/28/08     . Obesity (BMI 30-39.9)   . Old anterolateral myocardial infarction   . Presence of permanent cardiac pacemaker   . PVD (peripheral vascular disease) (HCC) 02/16/2014   2015, right femoral peroneal bypass grafting and left femoral-tibial bypass grafting by Dr. Arbie CookeyEarly for rest pain as well as nonhealing ulcer on toe accompanied with toe amputation  Arteriogram October 2015 with patent graft on the  right, 70% stenosis of graft on the left proximally.   . Stroke (HCC) 06/29/2016   speech and memory deficits, some R sided weakness  . Type 2 diabetes mellitus with renal manifestations [facility](HCC)       Past Surgical History:  Procedure Laterality Date  . ABDOMINAL ANGIOGRAM  01/08/2014   Procedure: ABDOMINAL ANGIOGRAM;  Surgeon: Sherren Kernsharles E Fields, MD;  [facility];  Service: Cardiovascular;;  . ABDOMINAL AORTAGRAM N/A 10/25/2014   Procedure: ABDOMINAL Ronny FlurryAORTAGRAM;  Surgeon: Chuck Hinthristopher S Dickson, MD;  [facility];  Service: Cardiovascular;  Laterality: N/A;  . AMPUTATION Right 08/04/2014   Procedure: AMPUTATION DIGIT-RIGHT 3RD TOE;  Surgeon: Larina Earthlyodd F Early, MD;  [facility];  Service: Vascular;  Laterality: Right;  . CARDIAC CATHETERIZATION  1995   w/ PTCA Diag 2nd MI  . EP IMPLANTABLE DEVICE N/A 05/10/2015   Procedure: Pacemaker Implant;  Surgeon: Hillis RangeJames Allred, MD;  [facility]: [facility] INVAS[facility] CV LAB;  Service: Cardiovascular;  Laterality: N/A;  . EP IMPLANTABLE DEVICE N/A 06/13/2015   Procedure: Lead Revision/Repair;  Surgeon: Duke SalviaSteven C Klein, MD;  [facility];  Service: Cardiovascular;  Laterality: N/A;  . FEMORAL-POPLITEAL BYPASS GRAFT Right 08/04/2014   Procedure: RIGHT FEMORAL-PERONEAL ARTERY BYPASS GRAFT;  Surgeon: Larina Earthlyodd F Early, MD;  [facility];  Service: Vascular;  Laterality: Right;  . FEMORAL-TIBIAL BYPASS GRAFT Left 02/24/2014   Procedure: BYPASS GRAFT FEMORAL-TIBIAL ARTERY;  Surgeon: Larina Earthlyodd F Early, MD;  [facility];  Service: Vascular;  Laterality: Left;  . INSERT / REPLACE / REMOVE PACEMAKER    . LOWER EXTREMITY ANGIOGRAM Left 01/08/2014   Procedure: LOWER EXTREMITY ANGIOGRAM;  Surgeon: Sherren Kernsharles E Fields, MD;  [facility];  Service: Cardiovascular;  Laterality: Left;  . LOWER EXTREMITY ANGIOGRAM Right 08/03/2014   Procedure: LOWER EXTREMITY ANGIOGRAM;  Surgeon: Nada LibmanVance W Brabham, MD;  [facility];  Service: Cardiovascular;  Laterality:  Right;  . PACEMAKER LEAD REMOVAL  06/13/2015   Procedure: Pacemaker Lead Removal;  Surgeon: Duke SalviaSteven C Klein, MD;  [facility];  Service: Cardiovascular;;  . repair of nerve in left arm  Left 2000      Social History:     Social History  Substance Use Topics  . Smoking status: Never Smoker  . Smokeless tobacco: Never Used  . Alcohol use No     Lives - Lives at home , LapelNephew  lives with her  Mobility - walker   Family History :     Family  History  Problem Relation Age of Onset  . Diabetes Father   . Hypertension Father   . Hypertension Daughter   . Hyperlipidemia Daughter   . Hyperlipidemia Son   . Hypertension Sister       Home Medications:   Prior to Admission medications   Medication Sig Start Date End Date Taking? Authorizing Provider  albuterol (PROVENTIL HFA;VENTOLIN HFA) 108 (90 BASE) MCG/ACT inhaler Inhale 2 puffs into the lungs every 6 (six) hours as needed for wheezing or shortness of breath.   Yes Historical Provider, MD  atorvastatin (LIPITOR) 80 MG tablet Take 1 tablet (80 mg total) by mouth every morning. 07/04/16  Yes Catarina Hartshornavid Tat, MD  B Complex-C-Folic Acid (RENA-VITE RX) 1 MG TABS Take 1 tablet by mouth every morning.  02/05/14  Yes Historical Provider, MD  carvedilol (COREG) 12.5 MG tablet Take 1 tablet (12.5 mg total) by mouth 2 (two) times daily. 02/04/14  Yes Lyn RecordsHenry W Smith, MD  Darbepoetin Alfa-Albumin (ARANESP IJ) Inject as directed every 14 (fourteen) days.   Yes Historical Provider, MD  docusate sodium (COLACE) 100 MG capsule Take 100 mg by mouth 2 (two) times daily.    Yes Historical Provider, MD  doxycycline (VIBRA-TABS) 100 MG tablet Take 100 mg by mouth 2 (two) times daily. For 10 days   Yes Historical Provider, MD  furosemide (LASIX) 80 MG tablet Take 1 tablet (80 mg total) by mouth 2 (two) times daily. 07/04/16  Yes Catarina Hartshornavid Tat, MD  guaiFENesin (ROBITUSSIN) 100 MG/5ML SOLN Take 10 mLs by mouth every 4 (four) hours as needed for cough or to  loosen phlegm.   Yes Historical Provider, MD  hydrALAZINE (APRESOLINE) 10 MG tablet Take 1 tablet (10 mg total) by mouth 3 (three) times daily. 09/21/15  Yes Duke SalviaSteven C Klein, MD  isosorbide mononitrate (IMDUR) 30 MG 24 hr tablet Take 1 tablet (30 mg total) by mouth daily. 07/30/16  Yes Lyn RecordsHenry W Smith, MD  JANUVIA 25 MG tablet Take 25 mg by mouth daily. 06/29/15  Yes Historical Provider, MD  loratadine (CLARITIN) 10 MG tablet Take 10 mg by mouth daily as needed for allergies.   Yes Historical Provider, MD  Melatonin 3 MG TABS Take 3 mg by mouth at bedtime as needed (for sleep).    Yes Historical Provider, MD  nitroGLYCERIN (NITROSTAT) 0.4 MG SL tablet Place 1 tablet (0.4 mg total) under the tongue every 5 (five) minutes x 3 doses as needed for chest pain. 06/17/15  Yes Lyn RecordsHenry W Smith, MD  paricalcitol (ZEMPLAR) 1 MCG capsule Take 1 mcg by mouth daily.  04/07/14  Yes Historical Provider, MD  potassium chloride SA (K-DUR,KLOR-CON) 20 MEQ tablet Take 1 tablet (20 mEq total) by mouth once. Patient taking differently: Take 20 mEq by mouth daily.  07/04/16  Yes Catarina Hartshornavid Tat, MD  Vitamin D, Ergocalciferol, (DRISDOL) 50000 UNITS CAPS capsule Take 1 capsule (50,000 Units total) by mouth every 7 (seven) days. 06/15/15  Yes Harold BarbanLawrence Ngo, MD  warfarin (COUMADIN) 6 MG tablet Take as directed by coumadin clinic Patient taking differently: Take 3-6 mg by mouth See admin instructions. 6 mg on Sun/Mon/Tues/Thurs/Fri and 3 mg on Wed/Sat (evenings) 07/30/16  Yes Lyn RecordsHenry W Smith, MD     Allergies:    No Known Allergies   Physical Exam:   Vitals  Blood pressure 109/60, pulse (!) 59, temperature (S) 97.9 F (36.6 C), temperature source Rectal, resp. rate 16, height 4\' 11"  (1.499 m), weight 78.9 kg (174 lb), SpO2 100 %.  1. General Frail elderly female laying in bed lying in bed in NAD,    2. Normal affect and insight, Not Suicidal or Homicidal, Awake Alert, .  3. No F.N deficits, ALL C.Nerves Intact, Motor grossly intact,  Plantars down going.  4. Ears and Eyes appear Normal, Conjunctivae clear, PERRLA. Moist Oral Mucosa.  5. Supple Neck, + JVD,   6. Symmetrical Chest wall movement, Good air movement bilaterally, No wheezing.  7. RRR, No Gallops, Rubs or Murmurs, No Parasternal Heave, edema +3.  8. Positive Bowel Sounds, Abdomen Soft, No tenderness, No organomegaly appriciated,No rebound -guarding or rigidity.  9.  No Cyanosis, Normal Skin Turgor, No Skin Rash or Bruise.  10. Good muscle tone,  joints appear normal , no effusions, Normal ROM.  11. No Palpable Lymph Nodes in Neck or Axillae    Data Review:    CBC  Recent Labs Lab 08/17/16 1548 08/17/16 1602  WBC 6.4  --   HGB 8.0* 9.2*  HCT 26.5* 27.0*  PLT 196  --   MCV 99.6  --   MCH 30.1  --   MCHC 30.2  --   RDW 15.5  --   LYMPHSABS 0.9  --   MONOABS 0.7  --   EOSABS 0.1  --   BASOSABS 0.0  --    ------------------------------------------------------------------------------------------------------------------  Chemistries   Recent Labs Lab 08/17/16 1548 08/17/16 1602  NA 135 137  K 4.4 4.4  CL 102 100*  CO2 21*  --   GLUCOSE 162* 159*  BUN 103* 108*  CREATININE 5.73* 5.70*  CALCIUM 9.6  --   AST 35  --   ALT 48  --   ALKPHOS 58  --   BILITOT 0.8  --    ------------------------------------------------------------------------------------------------------------------ estimated creatinine clearance is 6.2 mL/min (by C-G formula based on SCr of 5.7 mg/dL). ------------------------------------------------------------------------------------------------------------------ No results for input(s): TSH, T4TOTAL, T3FREE, THYROIDAB in the last 72 hours.  Invalid input(s): FREET3  Coagulation profile  Recent Labs Lab 08/14/16 08/17/16 1548  INR 2.5 2.73   ------------------------------------------------------------------------------------------------------------------- No results for input(s): DDIMER in the last 72  hours. -------------------------------------------------------------------------------------------------------------------  Cardiac Enzymes  Recent Labs Lab 08/17/16 1548  TROPONINI 0.05*   ------------------------------------------------------------------------------------------------------------------    Component Value Date/Time   BNP 4,317.4 (H) 08/17/2016 1548     ---------------------------------------------------------------------------------------------------------------  Urinalysis    Component Value Date/Time   COLORURINE YELLOW 08/17/2016 1645   APPEARANCEUR CLOUDY (A) 08/17/2016 1645   LABSPEC 1.016 08/17/2016 1645   PHURINE 5.0 08/17/2016 1645   GLUCOSEU NEGATIVE 08/17/2016 1645   HGBUR NEGATIVE 08/17/2016 1645   BILIRUBINUR NEGATIVE 08/17/2016 1645   KETONESUR NEGATIVE 08/17/2016 1645   PROTEINUR NEGATIVE 08/17/2016 1645   UROBILINOGEN 0.2 06/11/2015 1042   NITRITE NEGATIVE 08/17/2016 1645   LEUKOCYTESUR NEGATIVE 08/17/2016 1645    ----------------------------------------------------------------------------------------------------------------   Imaging Results:    Dg Chest 2 View  Result Date: 08/17/2016 CLINICAL DATA:  Weakness and shortness of breath for 1 day. Congestive heart failure. EXAM: CHEST  2 VIEW COMPARISON:  Two-view chest x-ray 08/16/2016 FINDINGS: The heart is enlarged. Pacing wires are stable. Atherosclerotic changes are noted at the aortic arch. Right greater than left pleural effusions are present. These are slightly decreased from prior exam. Previously noted edema is slightly improved as well. IMPRESSION: 1. Cardiomegaly with slightly improved edema. 2. Slight improved bilateral effusions, right greater than left and associated airspace disease, likely atelectasis. Electronically Signed   By: Marin Roberts M.D.   On: 08/17/2016 16:30  My personal review of EKG: Paced, Rate  60 /min, QTc 550 .   Assessment & Plan:    Active  Problems:   CAD S/P PTCA 1995   Type 2 diabetes mellitus with renal manifestations (HCC)   Anemia due to pre-end-stage renal disease treated with erythropoietin   Pacemaker-[employer] Jude May 2016   Cardiomyopathy-EF 40-45% by echo May 2016   Secondary hyperparathyroidism (HCC)   Acute on chronic combined systolic and diastolic heart failure (HCC)   Essential hypertension   Stroke (HCC)   HLD (hyperlipidemia)   Paroxysmal atrial fibrillation (HCC)   CHF (congestive heart failure) (HCC)  Acute on chronic combined CHF - With worsening lower extremity edema, dyspnea, chest x-ray with evidence of volume overload, significantly elevated BNP, admitted under CHF pathway. - Will start on IV diuresis, she is on 80 mg by mouth Lasix twice a day, will start on 80 mg IV Lasix twice a day, will monitor renal function closely. - No ACE inhibitor , no ARB given her renal failure, on beta blockers - Patient was started on doxycycline by PCP yesterday for presumed pneumonia, no evidence of pneumonia on chest x-ray, afebrile, no leukocytosis, will continue the doxycycline for now, and if  improvement of symptoms with diuresis then will discontinue doxycycline.  AKI on CKD stage V - Patient with worsening renal function, possibly due to cardiac renal syndrome, will start on IV diuresis and monitor closely, have worsening renal function, then will consult, patient primary nephrologist is Dr. Hyman Hopes.  Chronic respiratory failure - At baseline, on 2 L nasal cannula, continue with nebs when necessary  Atrial fibrillation - Paced rhythm, continue with Coreg and warfarin, pharmacy to dose  Diabetes mellitus type 2 - Hold oral hypoglycemic agents and start on insulin sliding scale  Hyperlipidemia - Continue statin  Stroke - Patient with recent has hospitalization secondary to stroke, thought to be secondary to A. Fib, and tinea with warfarin and statin  Asthma - Continue with nebs when  necessary  Hypertension - Blood pressure acceptable, will hold hydralazine, continue with Coreg as started on IV diuresis  Right lung abnormalities during previous imaging - Repeat CT chest in 3 months from the original CT, this would be late September, early October  Anemia in chronic kidney disease - Continue with Aranesp  Secondary hyperparathyroidism - Continue with Zemplar  Status post permanent pacemaker    DVT Prophylaxis Warfarin  AM Labs Ordered, also please review Full Orders  Family Communication: Admission, patients condition and plan of care including tests being ordered have been discussed with the patient and daughter who indicate understanding and agree with the plan and Code Status.  Code Status Full  Likely DC to : pending PT  Condition GUARDED    Consults called: None  Admission status: inpatient  Time spent in minutes : 65 minutes   Schylar Wuebker M.D on 08/17/2016 at 6:21 PM  Between 7am to 7pm - Pager - [phone]. After 7pm go to [email or web address] - password [email or web address]  [facility] - Office  [phone]

## 2016-08-17 NOTE — ED Provider Notes (Signed)
[facility]-EMERGENCY DEPT Provider Note   CSN: [id number] Arrival date & time: 08/17/16  1339     History   Chief Complaint Chief Complaint  Patient presents with  . Shortness of Breath  . Weakness    HPI Dorothy Bartlett is a 82 y.o. female.  HPI  History of HFpEF (60-65%), CAD, CKD5, HLD, HTN, Dm2, high grade AV block with pacer placed, recent admit for CVA and respiratory failure with CHF exacerbation, who presents with SOB and fatigue.  The family reports she has had a progressive functional decline over a week.  She was previously active after leaving rehab.  About 5 days ago she developed a cough with chills, and SOB.  Went to PCP yesterday and was rx'd doxycycline for PNA.  Today she seemed more SOB, so PCP advised patient be seen here.  Family also mentions leg swelling.  Patient and family deny complaints of chest pain or dark stools.    Past Medical History:  Diagnosis Date  . 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 Carotid stenosis    Carotid US (12/2013): < 60% RCA, < 40% LICA.  [name]. CHF (congestive heart failure) (HCC)   . Chronic combined systolic and diastolic heart failure (HCC) 04/13/2014  . Chronic kidney disease (CKD), stage V (HCC)    notes 07/03/2015; Dr. Hyman HopesWebb  . Complete heart block (HCC)    a. 05/2015 s/p [facility] Jude Medical Assurity DR model [id number] dual chamber PPM (serial number [id number]) (Dr. Johney FrameAllred).  . Coronary artery disease    a. s/p MI 1995 tx w/ PTCA to Dx, Ramus, ap LAD;  b. LHC (11/2004):  Inferior HK, EF 60%, proximal LAD 50%, ostial D1 80%, mid D1 95%, mid LAD 50-60%, distal LAD at the apex 90%, OM1 occluded (fills late by left to left collaterals), RCA 75%. Medical therapy recommended.;  c. Lexiscan Myoview (12/2013):  Prior ant-lat scar with very small area of peri-infarct ischemia in ant wall, EF 40 (high risk)   . Hx of echocardiogram    a. Echo 5/16: dist septal, dist ant, dist inf and apical AK, EF 40-45%, mod MR,  mild LAE, PASP 72  . Hyperlipidemia   . Hypertension   . Intrinsic asthma 01/28/2008    Mild intermittant asthma, PFT's 01/28/08     . Obesity (BMI 30-39.9)   . Old anterolateral myocardial infarction   . Presence of permanent cardiac pacemaker   . PVD (peripheral vascular disease) (HCC) 02/16/2014   2015, right femoral peroneal bypass grafting and left femoral-tibial bypass grafting by Dr. Arbie CookeyEarly for rest pain as well as nonhealing ulcer on toe accompanied with toe amputation  Arteriogram October 2015 with patent graft on the right, 70% stenosis of graft on the left proximally.   . Stroke (HCC) 06/29/2016   speech and memory deficits, some R sided weakness  . Type 2 diabetes mellitus with renal manifestations [facility](HCC)     Patient Active Problem List   Diagnosis Date Noted  . Long term (current) use of anticoagulants [Z79.01] 07/24/2016  . Dysphagia, post-stroke   . Stroke (HCC)   . HLD (hyperlipidemia)   . Paroxysmal atrial fibrillation (HCC)   . Acute ischemic stroke (HCC)   . Acute respiratory failure with hypoxia (HCC)   . Chronic kidney disease, stage V (HCC)   . Acute CVA (cerebrovascular accident) (HCC) 06/29/2016  . AKI (acute kidney injury) (HCC) 06/29/2016  . Acute on chronic combined systolic and diastolic heart failure (HCC)   .  Essential hypertension   . Vitamin D deficiency 06/14/2015  . Secondary hyperparathyroidism (HCC) 06/14/2015  . Dyspnea   . Pacemaker lead malfunction 06/11/2015  . Cardiomyopathy-EF 40-45% by echo May 2016 06/11/2015  . Palliative care encounter   . Onychomycosis 06/03/2015  . Pain in lower limb 06/03/2015  . Pulmonary HTN (HCC) 05/15/2015  . Pacemaker-St Jude May 2016   . Elevated troponin 05/09/2015  . Pre-syncope 05/09/2015  . 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 Complete heart block (HCC) 05/08/2015  . Obesity (BMI 30-39.9)   . Old anterolateral myocardial infarction   . Type 2 diabetes  mellitus with renal manifestations (HCC)   . Chronic combined systolic and diastolic heart failure (HCC) 04/13/2014  . PVD (peripheral vascular disease) (HCC) 02/16/2014  . CKD stage 5, GFR less than 15 ml/min 01/15/2014  . Intrinsic asthma 01/28/2008  . Hypertensive heart disease   . Hyperlipidemia   . CAD S/P PTCA 1995     Past Surgical History:  Procedure Laterality Date  . ABDOMINAL ANGIOGRAM  01/08/2014   Procedure: ABDOMINAL ANGIOGRAM;  Surgeon: Sherren Kerns, MD;  [facility];  Service: Cardiovascular;;  . ABDOMINAL AORTAGRAM N/A 10/25/2014   Procedure: ABDOMINAL Ronny Flurry;  Surgeon: Chuck Hint, MD;  Location: [facility] CATH LAB;  Service: Cardiovascular;  Laterality: N/A;  . AMPUTATION Right 08/04/2014   Procedure: AMPUTATION DIGIT-RIGHT 3RD TOE;  Surgeon: Larina Earthly, MD;  Location: [facility] OR;  Service: Vascular;  Laterality: Right;  . CARDIAC CATHETERIZATION  1995   w/ PTCA Diag 2nd MI  . EP IMPLANTABLE DEVICE N/A 05/10/2015   Procedure: Pacemaker Implant;  Surgeon: Hillis Range, MD;  Location: [facility] INVASIVE CV LAB;  Service: Cardiovascular;  Laterality: N/A;  . EP IMPLANTABLE DEVICE N/A 06/13/2015   Procedure: Lead Revision/Repair;  Surgeon: Duke Salvia, MD;  Location: [facility] INVASIVE CV LAB;  Service: Cardiovascular;  Laterality: N/A;  . FEMORAL-POPLITEAL BYPASS GRAFT Right 08/04/2014   Procedure: RIGHT FEMORAL-PERONEAL ARTERY BYPASS GRAFT;  Surgeon: Larina Earthly, MD;  Location: [facility] OR;  Service: Vascular;  Laterality: Right;  . FEMORAL-TIBIAL BYPASS GRAFT Left 02/24/2014   Procedure: BYPASS GRAFT FEMORAL-TIBIAL ARTERY;  Surgeon: Larina Earthly, MD;  Location: [facility] OR;  Service: Vascular;  Laterality: Left;  . INSERT / REPLACE / REMOVE PACEMAKER    . LOWER EXTREMITY ANGIOGRAM Left 01/08/2014   Procedure: LOWER EXTREMITY ANGIOGRAM;  Surgeon: Sherren Kerns, MD;  Location: [facility] CATH LAB;  Service: Cardiovascular;  Laterality: Left;  . LOWER EXTREMITY ANGIOGRAM Right 08/03/2014    Procedure: LOWER EXTREMITY ANGIOGRAM;  Surgeon: Nada Libman, MD;  Location: [facility] CATH LAB;  Service: Cardiovascular;  Laterality: Right;  . PACEMAKER LEAD REMOVAL  06/13/2015   Procedure: Pacemaker Lead Removal;  Surgeon: Duke Salvia, MD;  Location: [facility] INVASIVE CV LAB;  Service: Cardiovascular;;  . repair of nerve in left arm  Left 2000    OB History    No data available       Home Medications    Prior to Admission medications   Medication Sig Start Date End Date Taking? Authorizing Provider  albuterol (PROVENTIL HFA;VENTOLIN HFA) 108 (90 BASE) MCG/ACT inhaler Inhale 2 puffs into the lungs every 6 (six) hours as needed for wheezing or shortness of breath.    Historical Provider, MD  atorvastatin (LIPITOR) 80 MG tablet Take 1 tablet (80 mg total) by mouth every morning. 07/04/16   Catarina Hartshorn, MD  B Complex-C-Folic Acid (  RENA-VITE RX) 1 MG TABS Take 1 tablet by mouth every morning.  02/05/14   Historical Provider, MD  carvedilol (COREG) 12.5 MG tablet Take 1 tablet (12.5 mg total) by mouth 2 (two) times daily. 02/04/14   Lyn Records, MD  docusate sodium (COLACE) 100 MG capsule Take 100 mg by mouth 2 (two) times daily.     Historical Provider, MD  furosemide (LASIX) 80 MG tablet Take 1 tablet (80 mg total) by mouth 2 (two) times daily. 07/04/16   Catarina Hartshorn, MD  hydrALAZINE (APRESOLINE) 10 MG tablet Take 1 tablet (10 mg total) by mouth 3 (three) times daily. 09/21/15   Duke Salvia, MD  isosorbide mononitrate (IMDUR) 30 MG 24 hr tablet Take 1 tablet (30 mg total) by mouth daily. 07/30/16   Lyn Records, MD  JANUVIA 25 MG tablet Take 25 mg by mouth daily. 06/29/15   Historical Provider, MD  loratadine (CLARITIN) 10 MG tablet Take 10 mg by mouth daily as needed for allergies.    Historical Provider, MD  Melatonin 3 MG TABS Take 3 mg by mouth at bedtime as needed (for sleep).     Historical Provider, MD  nitroGLYCERIN (NITROSTAT) 0.4 MG SL tablet Place 1 tablet (0.4 mg total) under the tongue every  5 (five) minutes x 3 doses as needed for chest pain. 06/17/15   Lyn Records, MD  paricalcitol (ZEMPLAR) 1 MCG capsule Take 1 mcg by mouth daily.  04/07/14   Historical Provider, MD  potassium chloride SA (K-DUR,KLOR-CON) 20 MEQ tablet Take 1 tablet (20 mEq total) by mouth once. 07/04/16   Catarina Hartshorn, MD  Vitamin D, Ergocalciferol, (DRISDOL) 50000 UNITS CAPS capsule Take 1 capsule (50,000 Units total) by mouth every 7 (seven) days. 06/15/15   Harold Barban, MD  warfarin (COUMADIN) 6 MG tablet Take as directed by coumadin clinic 07/30/16   Lyn Records, MD    Family History Family History  Problem Relation Age of Onset  . Diabetes Father   . Hypertension Father   . Hypertension Daughter   . Hyperlipidemia Daughter   . Hyperlipidemia Son   . Hypertension Sister     Social History Social History  Substance Use Topics  . Smoking status: Never Smoker  . Smokeless tobacco: Never Used  . Alcohol use No     Allergies   Review of patient's allergies indicates no known allergies.   Review of Systems Review of Systems  Constitutional: Positive for fatigue. Negative for chills and fever.  HENT: Negative for ear pain and sore throat.   Eyes: Negative for pain and visual disturbance.  Respiratory: Positive for cough and shortness of breath.   Cardiovascular: Positive for leg swelling. Negative for chest pain and palpitations.  Gastrointestinal: Negative for abdominal pain and vomiting.  Genitourinary: Positive for decreased urine volume. Negative for dysuria and hematuria.  Musculoskeletal: Negative for arthralgias and back pain.  Skin: Negative for color change and rash.  Neurological: Negative for seizures and syncope.  All other systems reviewed and are negative.    Physical Exam Updated Vital Signs BP 124/60 (BP Location: Right Arm)   Pulse 61   Temp 97.8 F (36.6 C) (Oral)   Resp 18   Ht 4\' 11"  (1.499 m)   Wt 78.9 kg   SpO2 94%   BMI 35.14 kg/m   Physical Exam    Constitutional: She appears well-developed and well-nourished. She has a sickly appearance. No distress.  Morbidly obese   HENT:  Head: Normocephalic and atraumatic.  Eyes: Conjunctivae are normal.  Neck: Neck supple.  Cardiovascular: Normal rate and regular rhythm.   No murmur heard. Pulmonary/Chest: Effort normal and breath sounds normal. No respiratory distress.  Abdominal: Soft. There is no tenderness.  Musculoskeletal: She exhibits edema.  Neurological: She is alert.  Skin: Skin is warm and dry.  Psychiatric: She has a normal mood and affect.  Nursing note and vitals reviewed.    ED Treatments / Results  Labs (all labs ordered are listed, but only abnormal results are displayed) Labs Reviewed  CULTURE, BLOOD (ROUTINE X 2)  CULTURE, BLOOD (ROUTINE X 2)  URINE CULTURE  COMPREHENSIVE METABOLIC PANEL  URINALYSIS, ROUTINE W REFLEX MICROSCOPIC (NOT [facility])  CBC WITH DIFFERENTIAL/PLATELET  I-STAT CG4 LACTIC ACID, ED    EKG  EKG Interpretation  Date/Time:  Friday August 17 2016 14:32:04 EDT Ventricular Rate:  60 PR Interval:  152 QRS Duration: 210 QT Interval:  550 QTC Calculation: 550 R Axis:   -89 Text Interpretation:  Electronic atrial pacemaker No significant change since last tracing Confirmed by [facility] MD, Barbara CowerJASON [phone]([id number]) on 08/17/2016 3:20:06 PM       Radiology No results found.  Procedures Procedures (including critical care time)  Medications Ordered in ED Medications - No data to display   Initial Impression / Assessment and Plan / ED Course  I have reviewed the triage vital signs and the nursing notes.  Pertinent labs & imaging results that were available during my care of the patient were reviewed by me and considered in my medical decision making (see chart for details).  Clinical Course   Patient appears stable on initial assessment, has occasional SpO2 in the high 80s, so was placed on supplemental O2.  Labs, imaging ordered.  Labs are  significant for multiple abnormality. Patient does appear to have acute on chronic renal disease, with creatinine increasing about 1.  Patient also has a very elevated BNP, 4000, which is above her baseline. Chest x-ray does not specific really show interstitial edema, and there is some symmetric leg edema, but overall patient does not appear extremely fluid overloaded. Gentle hydration was provided the setting of worsening kidney disease. Patient also has an acidosis, which is likely from uremia. Chest x-ray did show mild bilateral pleural effusion, however patient does not have vital signs to suggest sepsis.  Overall this is a patient with worsening kidney disease, and acute on chronic heart failure. She is medical hospitalist for further management. Family was updated  Final Clinical Impressions(s) / ED Diagnoses   Final diagnoses:  None    New Prescriptions New Prescriptions   No medications on file     Marcelina MorelMichael Jden Want, MD 08/18/16 0007    Marily MemosJason Mesner, MD 08/19/16 1150

## 2016-08-18 DIAGNOSIS — N039 Chronic nephritic syndrome with unspecified morphologic changes: Secondary | ICD-10-CM

## 2016-08-18 DIAGNOSIS — I480 Paroxysmal atrial fibrillation: Secondary | ICD-10-CM

## 2016-08-18 DIAGNOSIS — Z9861 Coronary angioplasty status: Secondary | ICD-10-CM

## 2016-08-18 DIAGNOSIS — D631 Anemia in chronic kidney disease: Secondary | ICD-10-CM

## 2016-08-18 DIAGNOSIS — I429 Cardiomyopathy, unspecified: Secondary | ICD-10-CM

## 2016-08-18 DIAGNOSIS — N189 Chronic kidney disease, unspecified: Secondary | ICD-10-CM

## 2016-08-18 DIAGNOSIS — N179 Acute kidney failure, unspecified: Secondary | ICD-10-CM

## 2016-08-18 DIAGNOSIS — I5043 Acute on chronic combined systolic (congestive) and diastolic (congestive) heart failure: Secondary | ICD-10-CM

## 2016-08-18 DIAGNOSIS — I2510 Atherosclerotic heart disease of native coronary artery without angina pectoris: Secondary | ICD-10-CM

## 2016-08-18 DIAGNOSIS — E1121 Type 2 diabetes mellitus with diabetic nephropathy: Secondary | ICD-10-CM

## 2016-08-18 LAB — CBC
HCT: 23.2 % — ABNORMAL LOW (ref 36.0–46.0)
Hemoglobin: 7.1 g/dL — ABNORMAL LOW (ref 12.0–15.0)
MCH: 31.3 pg (ref 26.0–34.0)
MCHC: 31.5 g/dL (ref 30.0–36.0)
MCV: 99.6 fL (ref 78.0–100.0)
PLATELETS: 165 10*3/uL (ref 150–400)
RBC: 2.33 MIL/uL — ABNORMAL LOW (ref 3.87–5.11)
RDW: 15.6 % — AB (ref 11.5–15.5)
WBC: 5.9 10*3/uL (ref 4.0–10.5)

## 2016-08-18 LAB — GLUCOSE, CAPILLARY
GLUCOSE-CAPILLARY: 101 mg/dL — AB (ref 65–99)
Glucose-Capillary: 130 mg/dL — ABNORMAL HIGH (ref 65–99)
Glucose-Capillary: 129 mg/dL — ABNORMAL HIGH (ref 65–99)
Glucose-Capillary: 127 mg/dL — ABNORMAL HIGH (ref 65–99)

## 2016-08-18 LAB — BASIC METABOLIC PANEL
ANION GAP: 12 (ref 5–15)
BUN: 104 mg/dL — ABNORMAL HIGH (ref 6–20)
CALCIUM: 9.2 mg/dL (ref 8.9–10.3)
CO2: 22 mmol/L (ref 22–32)
Chloride: 104 mmol/L (ref 101–111)
Creatinine, Ser: 5.66 mg/dL — ABNORMAL HIGH (ref 0.44–1.00)
GFR, EST AFRICAN AMERICAN: 7 mL/min — AB (ref >60)
GFR, EST NON AFRICAN AMERICAN: 6 mL/min — AB (ref >60)
GLUCOSE: 101 mg/dL — AB (ref 65–99)
Potassium: 4.3 mmol/L (ref 3.5–5.1)
SODIUM: 138 mmol/L (ref 135–145)

## 2016-08-18 LAB — PROTIME-INR
INR: 3.08
PROTHROMBIN TIME: 32.5 s — AB (ref 11.4–15.2)

## 2016-08-18 LAB — URINE CULTURE

## 2016-08-18 MED ORDER — WARFARIN SODIUM 3 MG PO TABS
3.0000 mg | ORAL_TABLET | Freq: Once | ORAL | Status: AC
  Administered 2016-08-18: 3 mg via ORAL
  Filled 2016-08-18: qty 1

## 2016-08-18 NOTE — Progress Notes (Signed)
ANTICOAGULATION CONSULT NOTE - Initial Consult  Pharmacy Consult for warfarin Indication: atrial fibrillation  No Known Allergies  Patient Measurements: Height: 4\' 11"  (149.9 cm) Weight: 172 lb 9.6 oz (78.3 kg) (scale a) IBW/kg (Calculated) : 43.2  Vital Signs: Temp: 98.3 F (36.8 C) (08/19 0612) Temp Source: Oral (08/19 0612) BP: 100/75 (08/19 0612) Pulse Rate: 63 (08/19 0612)  Labs:  Recent Labs  08/17/16 1548 08/17/16 1602 08/18/16 0245  HGB 8.0* 9.2* 7.1*  HCT 26.5* 27.0* 23.2*  PLT 196  --  165  LABPROT 29.5*  --  32.5*  INR 2.73  --  3.08  CREATININE 5.73* 5.70* 5.66*  TROPONINI 0.05*  --   --     Estimated Creatinine Clearance: 6.2 mL/min (by C-G formula based on SCr of 5.66 mg/dL).   Assessment: 82 yo f here with worsening SOB and weakness over the last week along with nonproductive cough. History of AFib on warfarin. INR on admit within range at 2.73. INR this AM 3.08.  Hgb dropped to 7.1 - rechecking in AM.  Home dose of warfarin: 6mg  daily except 3mg  on Wednesdays and Saturdays.  Goal of Therapy:  INR 2-3 Monitor platelets by anticoagulation protocol: Yes   Plan:  - Warfarin 3 mg PO x 1 tonight - INR in AM to determine further dosing - Daily INR/CBC - Monitor s/sx of bleeding  Cassie L. Roseanne RenoStewart, PharmD Clinical Pharmacist Pager: [phone] 08/18/2016 11:56 AM

## 2016-08-18 NOTE — Progress Notes (Signed)
PROGRESS NOTE  Dorothy Bartlett  ZOX:[id number] DOB: [birth year] DOA: 08/17/2016 PCP: Eliott Nine, MD Outpatient Specialists:  Subjective: Feels slightly better than yesterday, still short of breath  Brief Narrative:  Dorothy Bartlett  is a 82 y.o. female, With past medical history of hyperlipidemia, hypertension, DM 2, multivessel CAD, chronic combined systolic and diastolic heart failure, permanent pacemaker for high degree AV block, PVD,obesity, intrinsic asthma, &stage V chronic kidney disease. , A. fib on warfarin for anticoagulation, recent admission for CVA thought to be secondary to A. Fib. Patient presents secondary to complaints of worsening shortness of breath over the last week, progressive weakness, cough, nonproductive, worsening lower extremity edema, as well reports orthopnea, reports chills, prescribed oral doxycycline by her PCP yesterday for presumed pneumonia, patient reports she is compliant with her medication, no excessive salt intake, she denies any chest pain, any coffee-ground emesis, any hemoptysis. - in ED workup significant for elevated BNP, volume overload on chest x-ray, worsening renal function, creatinine of 5.7, baseline is 4.9, EKG showing paced rhythm, patient admitted for CHF exacerbation  Assessment & Plan:   Active Problems:   CAD S/P PTCA 1995   Type 2 diabetes mellitus with renal manifestations (HCC)   Anemia due to pre-end-stage renal disease treated with erythropoietin   Pacemaker-St Jude May 2016   Cardiomyopathy-EF 40-45% by echo May 2016   Secondary hyperparathyroidism (HCC)   Acute on chronic combined systolic and diastolic heart failure (HCC)   Essential hypertension   Stroke (HCC)   HLD (hyperlipidemia)   Paroxysmal atrial fibrillation (HCC)   CHF (congestive heart failure) (HCC)   Acute on chronic systolic and diastolic combined CHF - With worsening lower extremity edema, dyspnea, chest x-ray with evidence of volume overload,  significantly elevated BNP, admitted under CHF pathway. - Will start on IV diuresis, she is on 80 mg by mouth Lasix twice a day, will start on 80 mg IV Lasix twice a day, will monitor renal function closely. - No ACE inhibitor , no ARB given her renal failure, on beta blockers - Patient was started on doxycycline by PCP yesterday for presumed pneumonia, no leukocytosis or x-ray findings, discontinue doxycycline.  AKI on CKD stage V - Patient with worsening renal function, possibly due to cardiac renal syndrome. - This could be worsening of her renal function, if worsening will consider renal consultation.  Chronic respiratory failure - At baseline, on 2 L nasal cannula, continue with nebs when necessary  Atrial fibrillation - Paced rhythm, continue with Coreg and warfarin, pharmacy to dose - CHA2DS2-VASc is at least 8 (gender, DM, HTN, CHF, PVD, CAD and CVA)  Diabetes mellitus type 2 - Hold oral hypoglycemic agents and start on insulin sliding scale  Hyperlipidemia - Continue statin  Stroke - Patient with recent has hospitalization secondary to stroke, thought to be secondary to A. Fib, continue warfarin and statin  Asthma - Continue with nebs when necessary  Hypertension - Blood pressure acceptable, will hold hydralazine, continue with Coreg as started on IV diuresis  Right lung abnormalities during previous imaging - Repeat CT chest in 3 months from the original CT, this would be late September, early October  Anemia in chronic kidney disease - Continue with Aranesp, hemoglobin is 7.1 compared to 8.0 yesterday, continue to drop or wall transfuse. -Check CBC in a.m.  Secondary hyperparathyroidism - Continue with Zemplar  Status post permanent pacemaker    DVT prophylaxis:  Code Status: Full Code Family Communication:  Disposition Plan:  Diet: Diet heart  healthy/carb modified Room service appropriate? Yes; Fluid consistency: Thin; Fluid restriction: 1200  mL Fluid  Consultants:   None  Procedures:   None  Antimicrobials:   None   Objective: Vitals:   08/17/16 2038 08/18/16 0048 08/18/16 0612 08/18/16 0936  BP: (!) 124/55 (!) 136/58 100/75   Pulse: 60 60 63   Resp: (!) 22 20 20    Temp: 98.5 F (36.9 C) 98.6 F (37 C) 98.3 F (36.8 C)   TempSrc: Oral Oral Oral   SpO2: 100% 100% 100% 98%  Weight: 88.2 kg (194 lb 7.1 oz)  78.3 kg (172 lb 9.6 oz)   Height:        Intake/Output Summary (Last 24 hours) at 08/18/16 1059 Last data filed at 08/18/16 0949  Gross per 24 hour  Intake              423 ml  Output              500 ml  Net              -77 ml   Filed Weights   08/17/16 1435 08/17/16 2038 08/18/16 0612  Weight: 78.9 kg (174 lb) 88.2 kg (194 lb 7.1 oz) 78.3 kg (172 lb 9.6 oz)    Examination: General exam: Appears calm and comfortable  Respiratory system: Clear to auscultation. Respiratory effort normal. Cardiovascular system: S1 & S2 heard, RRR. No JVD, murmurs, rubs, gallops or clicks. No pedal edema. Gastrointestinal system: Abdomen is nondistended, soft and nontender. No organomegaly or masses felt. Normal bowel sounds heard. Central nervous system: Alert and oriented. No focal neurological deficits. Extremities: Symmetric 5 x 5 power. Skin: No rashes, lesions or ulcers Psychiatry: Judgement and insight appear normal. Mood & affect appropriate.   Data Reviewed: I have personally reviewed following labs and imaging studies  CBC:  Recent Labs Lab 08/17/16 1548 08/17/16 1602 08/18/16 0245  WBC 6.4  --  5.9  NEUTROABS 4.8  --   --   HGB 8.0* 9.2* 7.1*  HCT 26.5* 27.0* 23.2*  MCV 99.6  --  99.6  PLT 196  --  165   Basic Metabolic Panel:  Recent Labs Lab 08/17/16 1548 08/17/16 1602 08/18/16 0245  NA 135 137 138  K 4.4 4.4 4.3  CL 102 100* 104  CO2 21*  --  22  GLUCOSE 162* 159* 101*  BUN 103* 108* 104*  CREATININE 5.73* 5.70* 5.66*  CALCIUM 9.6  --  9.2   GFR: Estimated Creatinine  Clearance: 6.2 mL/min (by C-G formula based on SCr of 5.66 mg/dL). Liver Function Tests:  Recent Labs Lab 08/17/16 1548  AST 35  ALT 48  ALKPHOS 58  BILITOT 0.8  PROT 6.9  ALBUMIN 3.9   No results for input(s): LIPASE, AMYLASE in the last 168 hours. No results for input(s): AMMONIA in the last 168 hours. Coagulation Profile:  Recent Labs Lab 08/14/16 08/17/16 1548 08/18/16 0245  INR 2.5 2.73 3.08   Cardiac Enzymes:  Recent Labs Lab 08/17/16 1548  TROPONINI 0.05*   BNP (last 3 results) No results for input(s): PROBNP in the last 8760 hours. HbA1C: No results for input(s): HGBA1C in the last 72 hours. CBG:  Recent Labs Lab 08/17/16 2135 08/18/16 0736  GLUCAP 167* 101*   Lipid Profile: No results for input(s): CHOL, HDL, LDLCALC, TRIG, CHOLHDL, LDLDIRECT in the last 72 hours. Thyroid Function Tests: No results for input(s): TSH, T4TOTAL, FREET4, T3FREE, THYROIDAB in the last 72 hours. Anemia  Panel: No results for input(s): VITAMINB12, FOLATE, FERRITIN, TIBC, IRON, RETICCTPCT in the last 72 hours. Urine analysis:    Component Value Date/Time   COLORURINE YELLOW 08/17/2016 1645   APPEARANCEUR CLOUDY (A) 08/17/2016 1645   LABSPEC 1.016 08/17/2016 1645   PHURINE 5.0 08/17/2016 1645   GLUCOSEU NEGATIVE 08/17/2016 1645   HGBUR NEGATIVE 08/17/2016 1645   BILIRUBINUR NEGATIVE 08/17/2016 1645   KETONESUR NEGATIVE 08/17/2016 1645   PROTEINUR NEGATIVE 08/17/2016 1645   UROBILINOGEN 0.2 06/11/2015 1042   NITRITE NEGATIVE 08/17/2016 1645   LEUKOCYTESUR NEGATIVE 08/17/2016 1645   Sepsis Labs: @LABRCNTIP (procalcitonin:4,lacticidven:4)  )No results found for this or any previous visit (from the past 240 hour(s)).   Invalid input(s): PROCALCITONIN, LACTICACIDVEN   Radiology Studies: Dg Chest 2 View  Result Date: 08/17/2016 CLINICAL DATA:  Weakness and shortness of breath for 1 day. Congestive heart failure. EXAM: CHEST  2 VIEW COMPARISON:  Two-view chest x-ray  08/16/2016 FINDINGS: The heart is enlarged. Pacing wires are stable. Atherosclerotic changes are noted at the aortic arch. Right greater than left pleural effusions are present. These are slightly decreased from prior exam. Previously noted edema is slightly improved as well. IMPRESSION: 1. Cardiomegaly with slightly improved edema. 2. Slight improved bilateral effusions, right greater than left and associated airspace disease, likely atelectasis. Electronically Signed   By: Marin Robertshristopher  Mattern M.D.   On: 08/17/2016 16:30        Scheduled Meds: . albuterol  2.5 mg Nebulization QID  . atorvastatin  80 mg Oral q morning - 10a  . carvedilol  12.5 mg Oral BID  . docusate sodium  100 mg Oral BID  . doxycycline  100 mg Oral BID  . furosemide  80 mg Intravenous BID  . insulin aspart  0-9 Units Subcutaneous TID WC  . isosorbide mononitrate  30 mg Oral Daily  . multivitamin  1 tablet Oral q morning - 10a  . paricalcitol  1 mcg Oral Daily  . sodium chloride flush  3 mL Intravenous Q12H  . Warfarin - Pharmacist Dosing Inpatient   Does not apply q1800   Continuous Infusions:    LOS: 1 day    Time spent: 35 minutes    Wadsworth Skolnick A, MD [facility] Pager [phone]  If 7PM-7AM, please contact night-coverage [email or web address] Password [email or web address] 08/18/2016, 10:59 AM

## 2016-08-19 DIAGNOSIS — I10 Essential (primary) hypertension: Secondary | ICD-10-CM

## 2016-08-19 DIAGNOSIS — E785 Hyperlipidemia, unspecified: Secondary | ICD-10-CM

## 2016-08-19 LAB — PROTIME-INR
INR: 3.52
PROTHROMBIN TIME: 36.1 s — AB (ref 11.4–15.2)

## 2016-08-19 LAB — GLUCOSE, CAPILLARY
GLUCOSE-CAPILLARY: 105 mg/dL — AB (ref 65–99)
GLUCOSE-CAPILLARY: 159 mg/dL — AB (ref 65–99)
Glucose-Capillary: 111 mg/dL — ABNORMAL HIGH (ref 65–99)

## 2016-08-19 LAB — CBC
HEMATOCRIT: 23 % — AB (ref 36.0–46.0)
HEMOGLOBIN: 7 g/dL — AB (ref 12.0–15.0)
MCH: 30.4 pg (ref 26.0–34.0)
MCHC: 30.4 g/dL (ref 30.0–36.0)
MCV: 100 fL (ref 78.0–100.0)
Platelets: 163 10*3/uL (ref 150–400)
RBC: 2.3 MIL/uL — AB (ref 3.87–5.11)
RDW: 15.5 % (ref 11.5–15.5)
WBC: 6 10*3/uL (ref 4.0–10.5)

## 2016-08-19 LAB — BASIC METABOLIC PANEL
ANION GAP: 14 (ref 5–15)
BUN: 108 mg/dL — ABNORMAL HIGH (ref 6–20)
CHLORIDE: 100 mmol/L — AB (ref 101–111)
CO2: 25 mmol/L (ref 22–32)
Calcium: 9.4 mg/dL (ref 8.9–10.3)
Creatinine, Ser: 5.77 mg/dL — ABNORMAL HIGH (ref 0.44–1.00)
GFR calc non Af Amer: 6 mL/min — ABNORMAL LOW (ref >60)
GFR, EST AFRICAN AMERICAN: 7 mL/min — AB (ref >60)
Glucose, Bld: 114 mg/dL — ABNORMAL HIGH (ref 65–99)
POTASSIUM: 3.9 mmol/L (ref 3.5–5.1)
SODIUM: 139 mmol/L (ref 135–145)

## 2016-08-19 LAB — PREPARE RBC (CROSSMATCH)

## 2016-08-19 MED ORDER — FUROSEMIDE 10 MG/ML IJ SOLN
80.0000 mg | Freq: Three times a day (TID) | INTRAMUSCULAR | Status: DC
  Administered 2016-08-19 – 2016-08-20 (×2): 80 mg via INTRAVENOUS
  Filled 2016-08-19 (×2): qty 8

## 2016-08-19 MED ORDER — SODIUM CHLORIDE 0.9 % IV SOLN: Freq: Once | INTRAVENOUS | Status: DC

## 2016-08-19 MED ORDER — FUROSEMIDE 10 MG/ML IJ SOLN
80.0000 mg | Freq: Once | INTRAMUSCULAR | Status: AC
  Administered 2016-08-19: 80 mg via INTRAVENOUS

## 2016-08-19 MED ORDER — ALBUTEROL SULFATE (2.5 MG/3ML) 0.083% IN NEBU
2.5000 mg | INHALATION_SOLUTION | Freq: Two times a day (BID) | RESPIRATORY_TRACT | Status: DC
  Administered 2016-08-19 – 2016-08-23 (×8): 2.5 mg via RESPIRATORY_TRACT
  Filled 2016-08-19 (×8): qty 3

## 2016-08-19 MED ORDER — METOLAZONE 5 MG PO TABS
5.0000 mg | ORAL_TABLET | Freq: Once | ORAL | Status: AC
  Administered 2016-08-19: 5 mg via ORAL
  Filled 2016-08-19: qty 1

## 2016-08-19 MED ORDER — ALBUTEROL SULFATE (2.5 MG/3ML) 0.083% IN NEBU
2.5000 mg | INHALATION_SOLUTION | Freq: Four times a day (QID) | RESPIRATORY_TRACT | Status: DC | PRN
  Filled 2016-08-19: qty 3

## 2016-08-19 NOTE — Progress Notes (Signed)
ANTICOAGULATION CONSULT NOTE - Initial Consult  Pharmacy Consult for warfarin Indication: atrial fibrillation  No Known Allergies  Patient Measurements: Height: 4\' 11"  (149.9 cm) Weight: 172 lb 11.2 oz (78.3 kg) IBW/kg (Calculated) : 43.2  Vital Signs: Temp: 98.6 F (37 C) (08/20 1150) Temp Source: Oral (08/20 1150) BP: 119/61 (08/20 1150) Pulse Rate: 61 (08/20 1150)  Labs:  Recent Labs  08/17/16 1548 08/17/16 1602 08/18/16 0245 08/19/16 0221  HGB 8.0* 9.2* 7.1* 7.0*  HCT 26.5* 27.0* 23.2* 23.0*  PLT 196  --  165 163  LABPROT 29.5*  --  32.5* 36.1*  INR 2.73  --  3.08 3.52  CREATININE 5.73* 5.70* 5.66* 5.77*  TROPONINI 0.05*  --   --   --     Estimated Creatinine Clearance: 6.1 mL/min (by C-G formula based on SCr of 5.77 mg/dL).   Assessment: 82 yo f here with worsening SOB and weakness over the last week along with nonproductive cough. History of AFib on warfarin. INR on admit within range at 2.73. INR this AM supratherapeutic at 3.52.  Hgb dropped to 7 - transfusing 1 unit PRBC.  Home dose of warfarin: 6mg  daily except 3mg  on Wednesdays and Saturdays.  Goal of Therapy:  INR 2-3 Monitor platelets by anticoagulation protocol: Yes   Plan:  - Hold warfarin tonight - INR in AM to determine further dosing - Daily INR/CBC - Monitor s/sx of bleeding  Maxime Beckner L. Roseanne RenoStewart, PharmD Clinical Pharmacist Pager: [phone] 08/19/2016 12:39 PM

## 2016-08-19 NOTE — Progress Notes (Signed)
PROGRESS NOTE  Dorothy Bartlett  ZOX:[id number] DOB: 1926-04-19 DOA: 08/17/2016 PCP: Eliott Nine, MD Outpatient Specialists:  Subjective: She was seen sitting at bedside denies any new complaints. Hemoglobin consistently low at 7.0.  Brief Narrative:  Dorothy Bartlett  is a 82 y.o. female, With past medical history of hyperlipidemia, hypertension, DM 2, multivessel CAD, chronic combined systolic and diastolic heart failure, permanent pacemaker for high degree AV block, PVD,obesity, intrinsic asthma, &stage V chronic kidney disease. , A. fib on warfarin for anticoagulation, recent admission for CVA thought to be secondary to A. Fib. Patient presents secondary to complaints of worsening shortness of breath over the last week, progressive weakness, cough, nonproductive, worsening lower extremity edema, as well reports orthopnea, reports chills, prescribed oral doxycycline by her PCP yesterday for presumed pneumonia, patient reports she is compliant with her medication, no excessive salt intake, she denies any chest pain, any coffee-ground emesis, any hemoptysis. - in ED workup significant for elevated BNP, volume overload on chest x-ray, worsening renal function, creatinine of 5.7, baseline is 4.9, EKG showing paced rhythm, patient admitted for CHF exacerbation  Assessment & Plan:   Active Problems:   CAD S/P PTCA 1995   Type 2 diabetes mellitus with renal manifestations (HCC)   Anemia due to pre-end-stage renal disease treated with erythropoietin   Pacemaker-St Jude May 2016   Cardiomyopathy-EF 40-45% by echo May 2016   Secondary hyperparathyroidism (HCC)   Acute on chronic combined systolic and diastolic heart failure (HCC)   Essential hypertension   Stroke (HCC)   HLD (hyperlipidemia)   Paroxysmal atrial fibrillation (HCC)   CHF (congestive heart failure) (HCC)   Acute on chronic systolic and diastolic combined CHF - With worsening lower extremity edema, dyspnea, CXR with  evidence of volume overload, significantly elevated BNP. - Will start on IV diuresis, she is on 80 mg by mouth Lasix twice a day, will start on 80 mg IV Lasix twice a day, will monitor renal function closely. - No ACE inhibitor , no ARB given her renal failure, on beta blockers - Was on doxycycline for presumed pneumonia, this is discontinued because of lack of WBC or CXR findings. -Not much of output with diuresis, will increase the Lasix frequency to 80 mg 3 times a day.  AKI on CKD stage V - Patient with worsening renal function, possibly due to cardiac renal syndrome. - This could be worsening of her renal function, if worsening will consider renal consultation.  Anemia in chronic kidney disease - Continue with Aranesp, hemoglobin was 8.0 on admission, today is 7.0. -I will transfuse 1 unit of packed RBCs, give Lasix with it, check CBC in a.m.  Chronic respiratory failure - At baseline, on 2 L nasal cannula, continue with nebs when necessary  Atrial fibrillation - Paced rhythm, continue with Coreg and warfarin, pharmacy to dose - CHA2DS2-VASc is at least 8 (gender, DM, HTN, CHF, PVD, CAD and CVA)  Diabetes mellitus type 2 - Hold oral hypoglycemic agents and start on insulin sliding scale  Hyperlipidemia - Continue statin  Stroke - Patient with recent has hospitalization secondary to stroke, thought to be secondary to A. Fib, continue warfarin and statin  Asthma - Continue with nebs when necessary  Hypertension - Blood pressure acceptable, will hold hydralazine, continue with Coreg as started on IV diuresis  Right lung abnormalities during previous imaging - Repeat CT chest in 3 months from the original CT, this would be late September, early October  Secondary hyperparathyroidism - Continue with  Zemplar  Status post permanent pacemaker    DVT prophylaxis:  Code Status: Full Code Family Communication:  Disposition Plan:  Diet: Diet heart healthy/carb  modified Room service appropriate? Yes; Fluid consistency: Thin; Fluid restriction: 1200 mL Fluid  Consultants:   None  Procedures:   None  Antimicrobials:   None   Objective: Vitals:   08/18/16 1949 08/19/16 0051 08/19/16 0358 08/19/16 0734  BP: (!) 129/54 (!) 93/53 (!) 99/58   Pulse: 63 (!) 59    Resp: 20 18 18    Temp: 98.2 F (36.8 C) 98.5 F (36.9 C) 98.4 F (36.9 C)   TempSrc: Oral Oral Oral   SpO2: 100% 100% 100% 99%  Weight:   78.3 kg (172 lb 11.2 oz)   Height:        Intake/Output Summary (Last 24 hours) at 08/19/16 1017 Last data filed at 08/19/16 0948  Gross per 24 hour  Intake              840 ml  Output              750 ml  Net               90 ml   Filed Weights   08/17/16 2038 08/18/16 0612 08/19/16 0358  Weight: 88.2 kg (194 lb 7.1 oz) 78.3 kg (172 lb 9.6 oz) 78.3 kg (172 lb 11.2 oz)    Examination: General exam: Appears calm and comfortable  Respiratory system: Clear to auscultation. Respiratory effort normal. Cardiovascular system: S1 & S2 heard, RRR. No JVD, murmurs, rubs, gallops or clicks. No pedal edema. Gastrointestinal system: Abdomen is nondistended, soft and nontender. No organomegaly or masses felt. Normal bowel sounds heard. Central nervous system: Alert and oriented. No focal neurological deficits. Extremities: Symmetric 5 x 5 power. Skin: No rashes, lesions or ulcers Psychiatry: Judgement and insight appear normal. Mood & affect appropriate.   Data Reviewed: I have personally reviewed following labs and imaging studies  CBC:  Recent Labs Lab 08/17/16 1548 08/17/16 1602 08/18/16 0245 08/19/16 0221  WBC 6.4  --  5.9 6.0  NEUTROABS 4.8  --   --   --   HGB 8.0* 9.2* 7.1* 7.0*  HCT 26.5* 27.0* 23.2* 23.0*  MCV 99.6  --  99.6 100.0  PLT 196  --  165 163   Basic Metabolic Panel:  Recent Labs Lab 08/17/16 1548 08/17/16 1602 08/18/16 0245 08/19/16 0221  NA 135 137 138 139  K 4.4 4.4 4.3 3.9  CL 102 100* 104 100*  CO2  21*  --  22 25  GLUCOSE 162* 159* 101* 114*  BUN 103* 108* 104* 108*  CREATININE 5.73* 5.70* 5.66* 5.77*  CALCIUM 9.6  --  9.2 9.4   GFR: Estimated Creatinine Clearance: 6.1 mL/min (by C-G formula based on SCr of 5.77 mg/dL). Liver Function Tests:  Recent Labs Lab 08/17/16 1548  AST 35  ALT 48  ALKPHOS 58  BILITOT 0.8  PROT 6.9  ALBUMIN 3.9   No results for input(s): LIPASE, AMYLASE in the last 168 hours. No results for input(s): AMMONIA in the last 168 hours. Coagulation Profile:  Recent Labs Lab 08/14/16 08/17/16 1548 08/18/16 0245 08/19/16 0221  INR 2.5 2.73 3.08 3.52   Cardiac Enzymes:  Recent Labs Lab 08/17/16 1548  TROPONINI 0.05*   BNP (last 3 results) No results for input(s): PROBNP in the last 8760 hours. HbA1C: No results for input(s): HGBA1C in the last 72 hours. CBG:  Recent  Labs Lab 08/18/16 0736 08/18/16 1123 08/18/16 1640 08/18/16 2119 08/19/16 0657  GLUCAP 101* 130* 129* 127* 105*   Lipid Profile: No results for input(s): CHOL, HDL, LDLCALC, TRIG, CHOLHDL, LDLDIRECT in the last 72 hours. Thyroid Function Tests: No results for input(s): TSH, T4TOTAL, FREET4, T3FREE, THYROIDAB in the last 72 hours. Anemia Panel: No results for input(s): VITAMINB12, FOLATE, FERRITIN, TIBC, IRON, RETICCTPCT in the last 72 hours. Urine analysis:    Component Value Date/Time   COLORURINE YELLOW 08/17/2016 1645   APPEARANCEUR CLOUDY (A) 08/17/2016 1645   LABSPEC 1.016 08/17/2016 1645   PHURINE 5.0 08/17/2016 1645   GLUCOSEU NEGATIVE 08/17/2016 1645   HGBUR NEGATIVE 08/17/2016 1645   BILIRUBINUR NEGATIVE 08/17/2016 1645   KETONESUR NEGATIVE 08/17/2016 1645   PROTEINUR NEGATIVE 08/17/2016 1645   UROBILINOGEN 0.2 06/11/2015 1042   NITRITE NEGATIVE 08/17/2016 1645   LEUKOCYTESUR NEGATIVE 08/17/2016 1645   Sepsis Labs: @LABRCNTIP (procalcitonin:4,lacticidven:4)  ) Recent Results (from the past 240 hour(s))  Culture, blood (Routine X 2)     Status:  None (Preliminary result)   Collection Time: 08/17/16  3:25 PM  Result Value Ref Range Status   Specimen Description BLOOD RIGHT HAND  Final   Special Requests BLOOD AEROBIC BOTTLE 6CC  Final   Culture NO GROWTH < 24 HOURS  Final   Report Status PENDING  Incomplete  Culture, blood (Routine X 2)     Status: None (Preliminary result)   Collection Time: 08/17/16  3:48 PM  Result Value Ref Range Status   Specimen Description BLOOD LEFT FOREARM  Final   Special Requests BOTTLES DRAWN AEROBIC AND ANAEROBIC 5CC  Final   Culture NO GROWTH < 24 HOURS  Final   Report Status PENDING  Incomplete  Urine culture     Status: Abnormal   Collection Time: 08/17/16  4:45 PM  Result Value Ref Range Status   Specimen Description URINE, CLEAN CATCH  Final   Special Requests NONE  Final   Culture MULTIPLE SPECIES PRESENT, SUGGEST RECOLLECTION (A)  Final   Report Status 08/18/2016 FINAL  Final     Invalid input(s): PROCALCITONIN, LACTICACIDVEN   Radiology Studies: Dg Chest 2 View  Result Date: 08/17/2016 CLINICAL DATA:  Weakness and shortness of breath for 1 day. Congestive heart failure. EXAM: CHEST  2 VIEW COMPARISON:  Two-view chest x-ray 08/16/2016 FINDINGS: The heart is enlarged. Pacing wires are stable. Atherosclerotic changes are noted at the aortic arch. Right greater than left pleural effusions are present. These are slightly decreased from prior exam. Previously noted edema is slightly improved as well. IMPRESSION: 1. Cardiomegaly with slightly improved edema. 2. Slight improved bilateral effusions, right greater than left and associated airspace disease, likely atelectasis. Electronically Signed   By: Marin Robertshristopher  Mattern M.D.   On: 08/17/2016 16:30        Scheduled Meds: . sodium chloride   Intravenous Once  . albuterol  2.5 mg Nebulization QID  . atorvastatin  80 mg Oral q morning - 10a  . carvedilol  12.5 mg Oral BID  . docusate sodium  100 mg Oral BID  . furosemide  80 mg Intravenous  BID  . furosemide  80 mg Intravenous Once  . insulin aspart  0-9 Units Subcutaneous TID WC  . isosorbide mononitrate  30 mg Oral Daily  . multivitamin  1 tablet Oral q morning - 10a  . paricalcitol  1 mcg Oral Daily  . sodium chloride flush  3 mL Intravenous Q12H  . Warfarin - Pharmacist Dosing  Inpatient   Does not apply q1800   Continuous Infusions:    LOS: 2 days    Time spent: 35 minutes    Abbie Jablon A, MD [facility] Pager [phone]  If 7PM-7AM, please contact night-coverage [email or web address] Password [email or web address] 08/19/2016, 10:17 AM

## 2016-08-20 ENCOUNTER — Encounter (HOSPITAL_COMMUNITY): Payer: Self-pay | Admitting: General Practice

## 2016-08-20 LAB — GLUCOSE, CAPILLARY
GLUCOSE-CAPILLARY: 123 mg/dL — AB (ref 65–99)
GLUCOSE-CAPILLARY: 127 mg/dL — AB (ref 65–99)
Glucose-Capillary: 114 mg/dL — ABNORMAL HIGH (ref 65–99)
Glucose-Capillary: 118 mg/dL — ABNORMAL HIGH (ref 65–99)
Glucose-Capillary: 131 mg/dL — ABNORMAL HIGH (ref 65–99)
Glucose-Capillary: 169 mg/dL — ABNORMAL HIGH (ref 65–99)

## 2016-08-20 LAB — CBC
HEMATOCRIT: 27.8 % — AB (ref 36.0–46.0)
HEMOGLOBIN: 8.7 g/dL — AB (ref 12.0–15.0)
MCH: 30.1 pg (ref 26.0–34.0)
MCHC: 31.3 g/dL (ref 30.0–36.0)
MCV: 96.2 fL (ref 78.0–100.0)
Platelets: 175 10*3/uL (ref 150–400)
RBC: 2.89 MIL/uL — ABNORMAL LOW (ref 3.87–5.11)
RDW: 16.4 % — ABNORMAL HIGH (ref 11.5–15.5)
WBC: 7.1 10*3/uL (ref 4.0–10.5)

## 2016-08-20 LAB — TYPE AND SCREEN
ABO/RH(D): AB NEG
ANTIBODY SCREEN: POSITIVE
DAT, IGG: NEGATIVE
UNIT DIVISION: 0
Unit division: 0

## 2016-08-20 LAB — BASIC METABOLIC PANEL
Anion gap: 15 (ref 5–15)
BUN: 114 mg/dL — AB (ref 6–20)
CHLORIDE: 101 mmol/L (ref 101–111)
CO2: 22 mmol/L (ref 22–32)
CREATININE: 5.66 mg/dL — AB (ref 0.44–1.00)
Calcium: 9.5 mg/dL (ref 8.9–10.3)
GFR calc Af Amer: 7 mL/min — ABNORMAL LOW (ref >60)
GFR calc non Af Amer: 6 mL/min — ABNORMAL LOW (ref >60)
Glucose, Bld: 134 mg/dL — ABNORMAL HIGH (ref 65–99)
Potassium: 3.6 mmol/L (ref 3.5–5.1)
Sodium: 138 mmol/L (ref 135–145)

## 2016-08-20 LAB — PROTIME-INR
INR: 2.98
Prothrombin Time: 31.7 seconds — ABNORMAL HIGH (ref 11.4–15.2)

## 2016-08-20 MED ORDER — WARFARIN SODIUM 2.5 MG PO TABS
2.5000 mg | ORAL_TABLET | Freq: Once | ORAL | Status: AC
  Administered 2016-08-20: 2.5 mg via ORAL
  Filled 2016-08-20: qty 1

## 2016-08-20 MED ORDER — FUROSEMIDE 10 MG/ML IJ SOLN
160.0000 mg | Freq: Three times a day (TID) | INTRAMUSCULAR | Status: DC
  Administered 2016-08-20 – 2016-08-22 (×7): 160 mg via INTRAVENOUS
  Filled 2016-08-20 (×9): qty 16

## 2016-08-20 NOTE — Progress Notes (Signed)
Patient is alert and confused with family at beside, Complaining of SOB. O2sat 82 reapplied O2 sat increased to 100%. Vital signs Otherwise stable..[name]

## 2016-08-20 NOTE — Progress Notes (Signed)
ANTICOAGULATION CONSULT NOTE - Initial Consult  Pharmacy Consult for warfarin Indication: atrial fibrillation  No Known Allergies  Patient Measurements: Height: 4\' 11"  (149.9 cm) Weight: 169 lb 8 oz (76.9 kg) IBW/kg (Calculated) : 43.2  Vital Signs: Temp: 97.1 F (36.2 C) (08/21 0454) Temp Source: Oral (08/21 0454) BP: 116/53 (08/21 0454) Pulse Rate: 61 (08/21 0454)  Labs:  Recent Labs  08/17/16 1548  08/18/16 0245 08/19/16 0221 08/20/16 0420  HGB 8.0*  < > 7.1* 7.0* 8.7*  HCT 26.5*  < > 23.2* 23.0* 27.8*  PLT 196  --  165 163 175  LABPROT 29.5*  --  32.5* 36.1* 31.7*  INR 2.73  --  3.08 3.52 2.98  CREATININE 5.73*  < > 5.66* 5.77* 5.66*  TROPONINI 0.05*  --   --   --   --   < > = values in this interval not displayed.  Estimated Creatinine Clearance: 6.1 mL/min (by C-G formula based on SCr of 5.66 mg/dL).   Assessment: 82 yo f here with worsening SOB and weakness over the last week along with nonproductive cough. Warfarin PTA for afib. Recent admission for CVA suspected secondary to such. CHA2DS2-VASc = 9.  INR on admit therapeutic at 2.73. INR today returns to therapeutic range at 2.98. H/H low - 8.7/27.8. Plt WNL. S/P 2 unit PRBC 8/20.   PTA dose: 6mg  daily except 3mg  on Wednesday and Saturday (36 mg/wk)  Goal of Therapy:  INR 2-3 Monitor platelets by anticoagulation protocol: Yes   Plan:  -Warfarin 2.5mg  x1 tonight -Daily INR -Monitor CBC, s/sx of bleeding   Sherle Poeob Joss Mcdill, PharmD Clinical Pharmacist 8:51 AM, 08/20/2016

## 2016-08-20 NOTE — Progress Notes (Signed)
PROGRESS NOTE  Dorothy FraiseCorine H Bartlett  RUE:[id number] DOB: 01-17-1926 DOA: 08/17/2016 PCP: Eliott NineLEAGUE-SOBON, JENNIFER, MD Outpatient Specialists:  Subjective: Transfused 1 unit of packed RBCs, hemoglobin improved to 8.7. On Lasix 80 mg 3 times a day, will increase the dose to 160  Brief Narrative:  Dorothy LevelCorine Bartlett  is a 82 y.o. female, With past medical history of hyperlipidemia, hypertension, DM 2, multivessel CAD, chronic combined systolic and diastolic heart failure, permanent pacemaker for high degree AV block, PVD,obesity, intrinsic asthma, &stage V chronic kidney disease. , A. fib on warfarin for anticoagulation, recent admission for CVA thought to be secondary to A. Fib. Patient presents secondary to complaints of worsening shortness of breath over the last week, progressive weakness, cough, nonproductive, worsening lower extremity edema, as well reports orthopnea, reports chills, prescribed oral doxycycline by her PCP yesterday for presumed pneumonia, patient reports she is compliant with her medication, no excessive salt intake, she denies any chest pain, any coffee-ground emesis, any hemoptysis. - in ED workup significant for elevated BNP, volume overload on chest x-ray, worsening renal function, creatinine of 5.7, baseline is 4.9, EKG showing paced rhythm, patient admitted for CHF exacerbation  Assessment & Plan:   Active Problems:   CAD S/P PTCA 1995   Type 2 diabetes mellitus with renal manifestations (HCC)   Anemia due to pre-end-stage renal disease treated with erythropoietin   Pacemaker-St Jude May 2016   Cardiomyopathy-EF 40-45% by echo May 2016   Secondary hyperparathyroidism (HCC)   Acute on chronic combined systolic and diastolic heart failure (HCC)   Essential hypertension   Stroke (HCC)   HLD (hyperlipidemia)   Paroxysmal atrial fibrillation (HCC)   CHF (congestive heart failure) (HCC)   Acute on chronic systolic and diastolic combined CHF - With worsening lower extremity  edema, dyspnea, CXR with evidence of volume overload, significantly elevated BNP. - Will start on IV diuresis, she is on 80 mg by mouth Lasix twice a day, will start on 80 mg IV Lasix twice a day, will monitor renal function closely. - No ACE inhibitor , no ARB given her renal failure, on beta blockers - Was on doxycycline for presumed pneumonia, this is discontinued because of lack of WBC or CXR findings. -She is on Lasix 80 mg 3 times a day, 1.1 L output yesterday, change the Lasix to 160 mg twice a day, might need to 3 times a day if needed. -Still continues to have positive fluid balance.  AKI on CKD stage V - Patient with worsening renal function, possibly due to cardiac renal syndrome. - This could be worsening of her renal function, if worsening will consider renal consultation.  Anemia in chronic kidney disease - Continue with Aranesp, hemoglobin was 8.0 on admission, today is 7.0. -I will transfuse 1 unit of packed RBCs, give Lasix with it, check CBC in a.m.  Chronic respiratory failure - At baseline, on 2 L nasal cannula, continue with nebs when necessary  Atrial fibrillation - Paced rhythm, continue with Coreg and warfarin, pharmacy to dose - CHA2DS2-VASc is at least 8 (gender, DM, HTN, CHF, PVD, CAD and CVA)  Diabetes mellitus type 2 - Hold oral hypoglycemic agents and start on insulin sliding scale  Hyperlipidemia - Continue statin  Stroke - Patient with recent has hospitalization secondary to stroke, thought to be secondary to A. Fib, continue warfarin and statin  Asthma - Continue with nebs when necessary  Hypertension - Blood pressure acceptable, will hold hydralazine, continue with Coreg as started on IV diuresis  Right lung abnormalities during previous imaging - Repeat CT chest in 3 months from the original CT, this would be late September, early October  Secondary hyperparathyroidism - Continue with Zemplar  Status post permanent  pacemaker    DVT prophylaxis:  Code Status: Full Code Family Communication:  Disposition Plan:  Diet: Diet heart healthy/carb modified Room service appropriate? Yes; Fluid consistency: Thin; Fluid restriction: 1200 mL Fluid  Consultants:   None  Procedures:   None  Antimicrobials:   None   Objective: Vitals:   08/19/16 2000 08/19/16 2133 08/20/16 0454 08/20/16 1052  BP: (!) 135/56 (!) 120/52 (!) 116/53 125/75  Pulse: 60 (!) 59 61   Resp: 18 18 18 20   Temp:  97.9 F (36.6 C) 97.1 F (36.2 C)   TempSrc:  Oral Oral   SpO2:  98% 100% 100%  Weight:   76.9 kg (169 lb 8 oz)   Height:        Intake/Output Summary (Last 24 hours) at 08/20/16 1132 Last data filed at 08/20/16 0900  Gross per 24 hour  Intake          1281.25 ml  Output             1301 ml  Net           -19.75 ml   Filed Weights   08/18/16 0612 08/19/16 0358 08/20/16 0454  Weight: 78.3 kg (172 lb 9.6 oz) 78.3 kg (172 lb 11.2 oz) 76.9 kg (169 lb 8 oz)    Examination: General exam: Appears calm and comfortable  Respiratory system: Clear to auscultation. Respiratory effort normal. Cardiovascular system: S1 & S2 heard, RRR. No JVD, murmurs, rubs, gallops or clicks. No pedal edema. Gastrointestinal system: Abdomen is nondistended, soft and nontender. No organomegaly or masses felt. Normal bowel sounds heard. Central nervous system: Alert and oriented. No focal neurological deficits. Extremities: Symmetric 5 x 5 power. Skin: No rashes, lesions or ulcers Psychiatry: Judgement and insight appear normal. Mood & affect appropriate.   Data Reviewed: I have personally reviewed following labs and imaging studies  CBC:  Recent Labs Lab 08/17/16 1548 08/17/16 1602 08/18/16 0245 08/19/16 0221 08/20/16 0420  WBC 6.4  --  5.9 6.0 7.1  NEUTROABS 4.8  --   --   --   --   HGB 8.0* 9.2* 7.1* 7.0* 8.7*  HCT 26.5* 27.0* 23.2* 23.0* 27.8*  MCV 99.6  --  99.6 100.0 96.2  PLT 196  --  165 163 175   Basic  Metabolic Panel:  Recent Labs Lab 08/17/16 1548 08/17/16 1602 08/18/16 0245 08/19/16 0221 08/20/16 0420  NA 135 137 138 139 138  K 4.4 4.4 4.3 3.9 3.6  CL 102 100* 104 100* 101  CO2 21*  --  22 25 22   GLUCOSE 162* 159* 101* 114* 134*  BUN 103* 108* 104* 108* 114*  CREATININE 5.73* 5.70* 5.66* 5.77* 5.66*  CALCIUM 9.6  --  9.2 9.4 9.5   GFR: Estimated Creatinine Clearance: 6.1 mL/min (by C-G formula based on SCr of 5.66 mg/dL). Liver Function Tests:  Recent Labs Lab 08/17/16 1548  AST 35  ALT 48  ALKPHOS 58  BILITOT 0.8  PROT 6.9  ALBUMIN 3.9   No results for input(s): LIPASE, AMYLASE in the last 168 hours. No results for input(s): AMMONIA in the last 168 hours. Coagulation Profile:  Recent Labs Lab 08/14/16 08/17/16 1548 08/18/16 0245 08/19/16 0221 08/20/16 0420  INR 2.5 2.73 3.08 3.52 2.98  Cardiac Enzymes:  Recent Labs Lab 08/17/16 1548  TROPONINI 0.05*   BNP (last 3 results) No results for input(s): PROBNP in the last 8760 hours. HbA1C: No results for input(s): HGBA1C in the last 72 hours. CBG:  Recent Labs Lab 08/19/16 1630 08/19/16 2208 08/20/16 0657 08/20/16 1045 08/20/16 1107  GLUCAP 114* 159* 118* 123* 131*   Lipid Profile: No results for input(s): CHOL, HDL, LDLCALC, TRIG, CHOLHDL, LDLDIRECT in the last 72 hours. Thyroid Function Tests: No results for input(s): TSH, T4TOTAL, FREET4, T3FREE, THYROIDAB in the last 72 hours. Anemia Panel: No results for input(s): VITAMINB12, FOLATE, FERRITIN, TIBC, IRON, RETICCTPCT in the last 72 hours. Urine analysis:    Component Value Date/Time   COLORURINE YELLOW 08/17/2016 1645   APPEARANCEUR CLOUDY (A) 08/17/2016 1645   LABSPEC 1.016 08/17/2016 1645   PHURINE 5.0 08/17/2016 1645   GLUCOSEU NEGATIVE 08/17/2016 1645   HGBUR NEGATIVE 08/17/2016 1645   BILIRUBINUR NEGATIVE 08/17/2016 1645   KETONESUR NEGATIVE 08/17/2016 1645   PROTEINUR NEGATIVE 08/17/2016 1645   UROBILINOGEN 0.2 06/11/2015  1042   NITRITE NEGATIVE 08/17/2016 1645   LEUKOCYTESUR NEGATIVE 08/17/2016 1645   Sepsis Labs: @LABRCNTIP (procalcitonin:4,lacticidven:4)  ) Recent Results (from the past 240 hour(s))  Culture, blood (Routine X 2)     Status: None (Preliminary result)   Collection Time: 08/17/16  3:25 PM  Result Value Ref Range Status   Specimen Description BLOOD RIGHT HAND  Final   Special Requests BLOOD AEROBIC BOTTLE 6CC  Final   Culture NO GROWTH 2 DAYS  Final   Report Status PENDING  Incomplete  Culture, blood (Routine X 2)     Status: None (Preliminary result)   Collection Time: 08/17/16  3:48 PM  Result Value Ref Range Status   Specimen Description BLOOD LEFT FOREARM  Final   Special Requests BOTTLES DRAWN AEROBIC AND ANAEROBIC 5CC  Final   Culture NO GROWTH 2 DAYS  Final   Report Status PENDING  Incomplete  Urine culture     Status: Abnormal   Collection Time: 08/17/16  4:45 PM  Result Value Ref Range Status   Specimen Description URINE, CLEAN CATCH  Final   Special Requests NONE  Final   Culture MULTIPLE SPECIES PRESENT, SUGGEST RECOLLECTION (A)  Final   Report Status 08/18/2016 FINAL  Final     Invalid input(s): PROCALCITONIN, LACTICACIDVEN   Radiology Studies: No results found.      Scheduled Meds: . sodium chloride   Intravenous Once  . albuterol  2.5 mg Nebulization BID  . atorvastatin  80 mg Oral q morning - 10a  . carvedilol  12.5 mg Oral BID  . docusate sodium  100 mg Oral BID  . furosemide  80 mg Intravenous Q8H  . insulin aspart  0-9 Units Subcutaneous TID WC  . isosorbide mononitrate  30 mg Oral Daily  . multivitamin  1 tablet Oral q morning - 10a  . paricalcitol  1 mcg Oral Daily  . sodium chloride flush  3 mL Intravenous Q12H  . warfarin  2.5 mg Oral ONCE-1800  . Warfarin - Pharmacist Dosing Inpatient   Does not apply q1800   Continuous Infusions:    LOS: 3 days    Time spent: 35 minutes    Tea Collums A, MD [facility] Pager  [phone]  If 7PM-7AM, please contact night-coverage [email or web address] Password [facility] 08/20/2016, 11:32 AM

## 2016-08-21 LAB — CBC
HCT: 27.3 % — ABNORMAL LOW (ref 36.0–46.0)
Hemoglobin: 8.5 g/dL — ABNORMAL LOW (ref 12.0–15.0)
MCH: 30 pg (ref 26.0–34.0)
MCHC: 31.1 g/dL (ref 30.0–36.0)
MCV: 96.5 fL (ref 78.0–100.0)
Platelets: 159 10*3/uL (ref 150–400)
RBC: 2.83 MIL/uL — AB (ref 3.87–5.11)
RDW: 15.9 % — AB (ref 11.5–15.5)
WBC: 6.8 10*3/uL (ref 4.0–10.5)

## 2016-08-21 LAB — BASIC METABOLIC PANEL
ANION GAP: 13 (ref 5–15)
BUN: 119 mg/dL — AB (ref 6–20)
CALCIUM: 9.6 mg/dL (ref 8.9–10.3)
CO2: 26 mmol/L (ref 22–32)
Chloride: 100 mmol/L — ABNORMAL LOW (ref 101–111)
Creatinine, Ser: 5.51 mg/dL — ABNORMAL HIGH (ref 0.44–1.00)
GFR calc Af Amer: 7 mL/min — ABNORMAL LOW (ref >60)
GFR, EST NON AFRICAN AMERICAN: 6 mL/min — AB (ref >60)
Glucose, Bld: 108 mg/dL — ABNORMAL HIGH (ref 65–99)
POTASSIUM: 3.4 mmol/L — AB (ref 3.5–5.1)
SODIUM: 139 mmol/L (ref 135–145)

## 2016-08-21 LAB — PROTIME-INR
INR: 2.55
PROTHROMBIN TIME: 27.9 s — AB (ref 11.4–15.2)

## 2016-08-21 LAB — GLUCOSE, CAPILLARY
GLUCOSE-CAPILLARY: 102 mg/dL — AB (ref 65–99)
GLUCOSE-CAPILLARY: 92 mg/dL (ref 65–99)
Glucose-Capillary: 143 mg/dL — ABNORMAL HIGH (ref 65–99)
Glucose-Capillary: 113 mg/dL — ABNORMAL HIGH (ref 65–99)

## 2016-08-21 MED ORDER — WARFARIN SODIUM 3 MG PO TABS
3.0000 mg | ORAL_TABLET | Freq: Once | ORAL | Status: AC
Start: 2016-08-21 — End: 2016-08-21
  Administered 2016-08-21: 3 mg via ORAL
  Filled 2016-08-21: qty 1

## 2016-08-21 NOTE — Care Management Important Message (Signed)
Important Message  Patient Details  Name: Claretta FraiseCorine H Uffelman MRN: [id number] Date of Birth: [birth year]   Medicare Important Message Given:  Yes    Joyous Gleghorn Stefan ChurchBratton 08/21/2016, 10:19 AM

## 2016-08-21 NOTE — Progress Notes (Signed)
PROGRESS NOTE  Dorothy Bartlett  ZOX:[id number] DOB: 12/10/1926 DOA: 08/17/2016 PCP: Eliott Nine, MD Outpatient Specialists:  Subjective: Reported she is feeling better than yesterday, -378 mL since yesterday. Although intake/output did not go down but weight down by 5 pounds (even with blood transfusion) PT to evaluate today, CSW consulted for return to versus home versus SNF.  Brief Narrative:  Dorothy Bartlett  is a 82 y.o. female, With past medical history of hyperlipidemia, hypertension, DM 2, multivessel CAD, chronic combined systolic and diastolic heart failure, permanent pacemaker for high degree AV block, PVD,obesity, intrinsic asthma, &stage V chronic kidney disease. , A. fib on warfarin for anticoagulation, recent admission for CVA thought to be secondary to A. Fib. Patient presents secondary to complaints of worsening shortness of breath over the last week, progressive weakness, cough, nonproductive, worsening lower extremity edema, as well reports orthopnea, reports chills, prescribed oral doxycycline by her PCP yesterday for presumed pneumonia, patient reports she is compliant with her medication, no excessive salt intake, she denies any chest pain, any coffee-ground emesis, any hemoptysis. - in ED workup significant for elevated BNP, volume overload on chest x-ray, worsening renal function, creatinine of 5.7, baseline is 4.9, EKG showing paced rhythm, patient admitted for CHF exacerbation  Assessment & Plan:   Active Problems:   CAD S/P PTCA 1995   Type 2 diabetes mellitus with renal manifestations (HCC)   Anemia due to pre-end-stage renal disease treated with erythropoietin   Pacemaker-St Jude May 2016   Cardiomyopathy-EF 40-45% by echo May 2016   Secondary hyperparathyroidism (HCC)   Acute on chronic combined systolic and diastolic heart failure (HCC)   Essential hypertension   Stroke (HCC)   HLD (hyperlipidemia)   Paroxysmal atrial fibrillation (HCC)   CHF  (congestive heart failure) (HCC)   Acute on chronic systolic and diastolic combined CHF - With worsening lower extremity edema, dyspnea, CXR with evidence of volume overload, significantly elevated BNP. - Will start on IV diuresis, she is on 80 mg by mouth Lasix twice a day, will start on 80 mg IV Lasix twice a day, will monitor renal function closely. - No ACE inhibitor , no ARB given her renal failure, on beta blockers - Was on doxycycline for presumed pneumonia, this is discontinued because of lack of WBC or CXR findings. -Discussed with Dr. Briant Cedar over the phone, Lasix 160 mg IV twice a day, monitor intake/output. -Patient probably benefit from palliative as outpatient. -PT/OT to evaluate, discharge in 1-2 days.  AKI on CKD stage V -Patient with worsening renal function, possibly due to cardiac renal syndrome. -This could be worsening of her renal function, if worsening will consider renal consultation. -Per nephrology she is not dialysis candidate, may benefit from palliative referral as outpatient.  Anemia in chronic kidney disease - Continue with Aranesp, hemoglobin was 8.0 on admission, today is 7.0. -Status post transfusion of 1 unit of packed RBCs.  Chronic respiratory failure - At baseline, on 2 L nasal cannula, continue with nebs when necessary  Atrial fibrillation - Paced rhythm, continue with Coreg and warfarin, pharmacy to dose - CHA2DS2-VASc is at least 8 (gender, DM, HTN, CHF, PVD, CAD and CVA).  Diabetes mellitus type 2 - Hold oral hypoglycemic agents and start on insulin sliding scale  Hyperlipidemia - Continue statin  Stroke - Patient with recent has hospitalization secondary to stroke, thought to be secondary to A. Fib, continue warfarin and statin  Asthma - Continue with nebs when necessary  Hypertension - Blood pressure acceptable,  will hold hydralazine, continue with Coreg as started on IV diuresis  Right lung abnormalities during  previous imaging - Repeat CT chest in 3 months from the original CT, this would be late September, early October  Secondary hyperparathyroidism - Continue with Zemplar  Status post permanent pacemaker    DVT prophylaxis: She is on Coumadin Code Status: Full Code Family Communication:  Disposition Plan: Likely discharge in a.m., PT to evaluate Diet: Diet heart healthy/carb modified Room service appropriate? Yes; Fluid consistency: Thin; Fluid restriction: 1200 mL Fluid  Consultants:   None  Procedures:   None  Antimicrobials:   None   Objective: Vitals:   08/20/16 2125 08/21/16 0503 08/21/16 0607 08/21/16 0936  BP:  (!) 119/46    Pulse:  60    Resp:  16    Temp:  98.7 F (37.1 C)    TempSrc:  Oral    SpO2: 99% 98%  97%  Weight:   77.2 kg (170 lb 1.6 oz)   Height:        Intake/Output Summary (Last 24 hours) at 08/21/16 1136 Last data filed at 08/21/16 0840  Gross per 24 hour  Intake              778 ml  Output             1000 ml  Net             -222 ml   Filed Weights   08/19/16 0358 08/20/16 0454 08/21/16 0607  Weight: 78.3 kg (172 lb 11.2 oz) 76.9 kg (169 lb 8 oz) 77.2 kg (170 lb 1.6 oz)    Examination: General exam: Appears calm and comfortable  Respiratory system: Clear to auscultation. Respiratory effort normal. Cardiovascular system: S1 & S2 heard, RRR. No JVD, murmurs, rubs, gallops or clicks. No pedal edema. Gastrointestinal system: Abdomen is nondistended, soft and nontender. No organomegaly or masses felt. Normal bowel sounds heard. Central nervous system: Alert and oriented. No focal neurological deficits. Extremities: Symmetric 5 x 5 power. Skin: No rashes, lesions or ulcers Psychiatry: Judgement and insight appear normal. Mood & affect appropriate.   Data Reviewed: I have personally reviewed following labs and imaging studies  CBC:  Recent Labs Lab 08/17/16 1548 08/17/16 1602 08/18/16 0245 08/19/16 0221 08/20/16 0420  08/21/16 0338  WBC 6.4  --  5.9 6.0 7.1 6.8  NEUTROABS 4.8  --   --   --   --   --   HGB 8.0* 9.2* 7.1* 7.0* 8.7* 8.5*  HCT 26.5* 27.0* 23.2* 23.0* 27.8* 27.3*  MCV 99.6  --  99.6 100.0 96.2 96.5  PLT 196  --  165 163 175 159   Basic Metabolic Panel:  Recent Labs Lab 08/17/16 1548 08/17/16 1602 08/18/16 0245 08/19/16 0221 08/20/16 0420 08/21/16 0338  NA 135 137 138 139 138 139  K 4.4 4.4 4.3 3.9 3.6 3.4*  CL 102 100* 104 100* 101 100*  CO2 21*  --  22 25 22 26   GLUCOSE 162* 159* 101* 114* 134* 108*  BUN 103* 108* 104* 108* 114* 119*  CREATININE 5.73* 5.70* 5.66* 5.77* 5.66* 5.51*  CALCIUM 9.6  --  9.2 9.4 9.5 9.6   GFR: Estimated Creatinine Clearance: 6.3 mL/min (by C-G formula based on SCr of 5.51 mg/dL). Liver Function Tests:  Recent Labs Lab 08/17/16 1548  AST 35  ALT 48  ALKPHOS 58  BILITOT 0.8  PROT 6.9  ALBUMIN 3.9   No results for input(s):  LIPASE, AMYLASE in the last 168 hours. No results for input(s): AMMONIA in the last 168 hours. Coagulation Profile:  Recent Labs Lab 08/17/16 1548 08/18/16 0245 08/19/16 0221 08/20/16 0420 08/21/16 0338  INR 2.73 3.08 3.52 2.98 2.55   Cardiac Enzymes:  Recent Labs Lab 08/17/16 1548  TROPONINI 0.05*   BNP (last 3 results) No results for input(s): PROBNP in the last 8760 hours. HbA1C: No results for input(s): HGBA1C in the last 72 hours. CBG:  Recent Labs Lab 08/20/16 1107 08/20/16 1645 08/20/16 2151 08/21/16 0625 08/21/16 1124  GLUCAP 131* 127* 169* 102* 143*   Lipid Profile: No results for input(s): CHOL, HDL, LDLCALC, TRIG, CHOLHDL, LDLDIRECT in the last 72 hours. Thyroid Function Tests: No results for input(s): TSH, T4TOTAL, FREET4, T3FREE, THYROIDAB in the last 72 hours. Anemia Panel: No results for input(s): VITAMINB12, FOLATE, FERRITIN, TIBC, IRON, RETICCTPCT in the last 72 hours. Urine analysis:    Component Value Date/Time   COLORURINE YELLOW 08/17/2016 1645   APPEARANCEUR CLOUDY  (A) 08/17/2016 1645   LABSPEC 1.016 08/17/2016 1645   PHURINE 5.0 08/17/2016 1645   GLUCOSEU NEGATIVE 08/17/2016 1645   HGBUR NEGATIVE 08/17/2016 1645   BILIRUBINUR NEGATIVE 08/17/2016 1645   KETONESUR NEGATIVE 08/17/2016 1645   PROTEINUR NEGATIVE 08/17/2016 1645   UROBILINOGEN 0.2 06/11/2015 1042   NITRITE NEGATIVE 08/17/2016 1645   LEUKOCYTESUR NEGATIVE 08/17/2016 1645   Sepsis Labs: @LABRCNTIP (procalcitonin:4,lacticidven:4)  ) Recent Results (from the past 240 hour(s))  Culture, blood (Routine X 2)     Status: None (Preliminary result)   Collection Time: 08/17/16  3:25 PM  Result Value Ref Range Status   Specimen Description BLOOD RIGHT HAND  Final   Special Requests BLOOD AEROBIC BOTTLE 6CC  Final   Culture NO GROWTH 3 DAYS  Final   Report Status PENDING  Incomplete  Culture, blood (Routine X 2)     Status: None (Preliminary result)   Collection Time: 08/17/16  3:48 PM  Result Value Ref Range Status   Specimen Description BLOOD LEFT FOREARM  Final   Special Requests BOTTLES DRAWN AEROBIC AND ANAEROBIC 5CC  Final   Culture NO GROWTH 3 DAYS  Final   Report Status PENDING  Incomplete  Urine culture     Status: Abnormal   Collection Time: 08/17/16  4:45 PM  Result Value Ref Range Status   Specimen Description URINE, CLEAN CATCH  Final   Special Requests NONE  Final   Culture MULTIPLE SPECIES PRESENT, SUGGEST RECOLLECTION (A)  Final   Report Status 08/18/2016 FINAL  Final     Invalid input(s): PROCALCITONIN, LACTICACIDVEN   Radiology Studies: No results found.      Scheduled Meds: . sodium chloride   Intravenous Once  . albuterol  2.5 mg Nebulization BID  . atorvastatin  80 mg Oral q morning - 10a  . carvedilol  12.5 mg Oral BID  . docusate sodium  100 mg Oral BID  . furosemide  160 mg Intravenous Q8H  . insulin aspart  0-9 Units Subcutaneous TID WC  . isosorbide mononitrate  30 mg Oral Daily  . multivitamin  1 tablet Oral q morning - 10a  . paricalcitol  1  mcg Oral Daily  . sodium chloride flush  3 mL Intravenous Q12H  . warfarin  3 mg Oral ONCE-1800  . Warfarin - Pharmacist Dosing Inpatient   Does not apply q1800   Continuous Infusions:    LOS: 4 days    Time spent: 35 minutes  Clint LippsELMAHI,Ezekial Arns A, MD [facility] Pager [phone]  If 7PM-7AM, please contact night-coverage [email or web address] Password [email or web address] 08/21/2016, 11:36 AM

## 2016-08-21 NOTE — Progress Notes (Signed)
ANTICOAGULATION CONSULT NOTE - Initial Consult  Pharmacy Consult for warfarin Indication: atrial fibrillation  No Known Allergies  Patient Measurements: Height: 4\' 11"  (149.9 cm) Weight: 170 lb 1.6 oz (77.2 kg) IBW/kg (Calculated) : 43.2  Vital Signs: Temp: 98.7 F (37.1 C) (08/22 0503) Temp Source: Oral (08/22 0503) BP: 119/46 (08/22 0503) Pulse Rate: 60 (08/22 0503)  Labs:  Recent Labs  08/19/16 0221 08/20/16 0420 08/21/16 0338  HGB 7.0* 8.7* 8.5*  HCT 23.0* 27.8* 27.3*  PLT 163 175 159  LABPROT 36.1* 31.7* 27.9*  INR 3.52 2.98 2.55  CREATININE 5.77* 5.66* 5.51*    Estimated Creatinine Clearance: 6.3 mL/min (by C-G formula based on SCr of 5.51 mg/dL).   Assessment: 82 yo f here with worsening SOB and weakness over the last week along with nonproductive cough. Warfarin PTA for afib. Recent admission for CVA suspected secondary to such. CHA2DS2-VASc = 9. INR today remains therapeutic at 2.55. H/H low stable - 8.7/27.8. Plt WNL. S/P 2 unit PRBC 8/20.   PTA dose: 6mg  daily except 3mg  on Wednesday and Saturday (36 mg/wk)  Goal of Therapy:  INR 2-3 Monitor platelets by anticoagulation protocol: Yes   Plan:  -Warfarin 3mg  x1 tonight -Daily INR -Monitor CBC, s/sx of bleeding   Sherle Poeob Vincent, PharmD Clinical Pharmacist 10:01 AM, 08/21/2016

## 2016-08-21 NOTE — Progress Notes (Signed)
Mid shift Report Received, pt in bed, Stable with Lines tubes intact, Iv infusing Laisix, No s/s of pain, vitals stable will continue to monitor LOC plan to get up for evening meal.

## 2016-08-21 NOTE — Progress Notes (Signed)
PT Cancellation Note  Patient Details Name: Dorothy Bartlett MRN: [id number] DOB: 31-Jul-1926   Cancelled Treatment:    Reason Eval/Treat Not Completed: Fatigue/lethargy limiting ability to participate. Patient recently completed working with Occupational Therapy (confirmed with OT although OT note not yet entered). She has returned to bed and reported she did not sleep well last night and requested PT return tomorrow.    Amenda Duclos 08/21/2016, 4:16 PM  Pager [phone]

## 2016-08-22 DIAGNOSIS — I639 Cerebral infarction, unspecified: Secondary | ICD-10-CM

## 2016-08-22 LAB — PROTIME-INR
INR: 2.45
Prothrombin Time: 27 seconds — ABNORMAL HIGH (ref 11.4–15.2)

## 2016-08-22 LAB — BASIC METABOLIC PANEL
ANION GAP: 14 (ref 5–15)
BUN: 116 mg/dL — ABNORMAL HIGH (ref 6–20)
CALCIUM: 9.7 mg/dL (ref 8.9–10.3)
CO2: 28 mmol/L (ref 22–32)
Chloride: 97 mmol/L — ABNORMAL LOW (ref 101–111)
Creatinine, Ser: 5.47 mg/dL — ABNORMAL HIGH (ref 0.44–1.00)
GFR, EST AFRICAN AMERICAN: 7 mL/min — AB (ref >60)
GFR, EST NON AFRICAN AMERICAN: 6 mL/min — AB (ref >60)
GLUCOSE: 91 mg/dL (ref 65–99)
POTASSIUM: 3.5 mmol/L (ref 3.5–5.1)
Sodium: 139 mmol/L (ref 135–145)

## 2016-08-22 LAB — GLUCOSE, CAPILLARY
GLUCOSE-CAPILLARY: 183 mg/dL — AB (ref 65–99)
GLUCOSE-CAPILLARY: 127 mg/dL — AB (ref 65–99)
GLUCOSE-CAPILLARY: 146 mg/dL — AB (ref 65–99)
Glucose-Capillary: 82 mg/dL (ref 65–99)

## 2016-08-22 LAB — CBC
HEMATOCRIT: 28.1 % — AB (ref 36.0–46.0)
Hemoglobin: 8.7 g/dL — ABNORMAL LOW (ref 12.0–15.0)
MCH: 30.1 pg (ref 26.0–34.0)
MCHC: 31 g/dL (ref 30.0–36.0)
MCV: 97.2 fL (ref 78.0–100.0)
Platelets: 174 10*3/uL (ref 150–400)
RBC: 2.89 MIL/uL — AB (ref 3.87–5.11)
RDW: 15.6 % — ABNORMAL HIGH (ref 11.5–15.5)
WBC: 6.8 10*3/uL (ref 4.0–10.5)

## 2016-08-22 LAB — CULTURE, BLOOD (ROUTINE X 2)
Culture: NO GROWTH
Culture: NO GROWTH

## 2016-08-22 MED ORDER — TORSEMIDE 20 MG PO TABS
80.0000 mg | ORAL_TABLET | Freq: Two times a day (BID) | ORAL | Status: DC
  Administered 2016-08-22 – 2016-08-23 (×2): 80 mg via ORAL
  Filled 2016-08-22 (×2): qty 4

## 2016-08-22 MED ORDER — WARFARIN SODIUM 3 MG PO TABS
6.0000 mg | ORAL_TABLET | Freq: Once | ORAL | Status: AC
  Administered 2016-08-22: 6 mg via ORAL
  Filled 2016-08-22: qty 2

## 2016-08-22 NOTE — Progress Notes (Signed)
PROGRESS NOTE  Dorothy Bartlett  ZOX:[id number] DOB: [birth year] DOA: 08/17/2016 PCP: Eliott NineLEAGUE-SOBON, JENNIFER, MD Outpatient Specialists:  Subjective: Reported she is feeling better. No CP; denies significant orthopnea. Patient is afebrile.  Brief Narrative:  Dorothy Bartlett  is a 82 y.o. female, With past medical history of hyperlipidemia, hypertension, DM 2, multivessel CAD, chronic combined systolic and diastolic heart failure, permanent pacemaker for high degree AV block, PVD,obesity, intrinsic asthma, &stage V chronic kidney disease. , A. fib on warfarin for anticoagulation, recent admission for CVA thought to be secondary to A. Fib. Patient presents secondary to complaints of worsening shortness of breath over the last week, progressive weakness, cough, nonproductive, worsening lower extremity edema, as well reports orthopnea, reports chills, prescribed oral doxycycline by her PCP yesterday for presumed pneumonia, patient reports she is compliant with her medication, no excessive salt intake, she denies any chest pain, any coffee-ground emesis, any hemoptysis. - in ED workup significant for elevated BNP, volume overload on chest x-ray, worsening renal function, creatinine of 5.7, baseline is 4.9, EKG showing paced rhythm, patient admitted for CHF exacerbation  Assessment & Plan:   Active Problems:   CAD S/P PTCA 1995   Type 2 diabetes mellitus with renal manifestations (HCC)   Anemia due to pre-end-stage renal disease treated with erythropoietin   Pacemaker-St Jude May 2016   Cardiomyopathy-EF 40-45% by echo May 2016   Secondary hyperparathyroidism (HCC)   Acute on chronic combined systolic and diastolic heart failure (HCC)   Essential hypertension   Stroke (HCC)   HLD (hyperlipidemia)   Paroxysmal atrial fibrillation (HCC)   CHF (congestive heart failure) (HCC)   Acute on chronic systolic and diastolic combined CHF - With worsening lower extremity edema, dyspnea, CXR with evidence of  volume overload, significantly elevated BNP on admission . - Will recommend low sodium diet and daily weights  - No ACE inhibitor , no ARB given her renal failure, continue beta blockers - Was on doxycycline for presumed pneumonia, this is discontinued because of lack of WBC, fever or CXR findings. -difficult situation with cardiorenal syndrome and inability for optimal diuresis with medications -case was Discussed with Dr. Briant CedarMattingly over the phone, Lasix 160 mg IV twice a day, initially with very minimal difference. Not a candidate for HD -switched now to demadex oral (equivalent dose) -hospice at home  AKI on CKD stage V -Patient with worsening renal function, possibly due to cardiac renal syndrome. -Per nephrology she is not dialysis candidate, discussed GOC of patient and family and they have opted for hospice at home -will switch to demadex 80mg  BID as recommended by nephrology now  Anemia in chronic kidney disease - Continue with Aranesp -Status post transfusion of 1 unit of packed RBCs. -last Hgb stable at 8.7  Chronic respiratory failure - At baseline, on 2 L nasal cannula, continue with nebs when necessary  Atrial fibrillation - Paced rhythm, continue with Coreg and warfarin, pharmacy to dose - CHA2DS2-VASc is at least 8 (gender, DM, HTN, CHF, PVD, CAD and CVA).  Diabetes mellitus type 2 - Hold oral hypoglycemic agents and continue insulin sliding scale  Hyperlipidemia - Continue statin for now  Stroke -Patient with recent hospitalization secondary to stroke, thought to be secondary to A. Fib, continue warfarin and statin. -no new focal deficit -patient transitioning to comfort/symptomatic management   Asthma -Continue with nebs when necessary -no wheezing   Hypertension - Blood pressure acceptable, continue with Coreg and demadex now  Right lung abnormalities during previous imaging -initially recommended  to Repeat CT chest in 3 months from the original  CT, this would be late September/early October. -patient is now enrolled into hospice and plan is for comfort care and symptoms management  -patient and family are not interested in any further workup/invasive treatment   Secondary hyperparathyroidism - Continue with Zemplar  Status post permanent pacemaker   DVT prophylaxis: She is on Coumadin Code Status: DNR Family Communication: daughter at bedside  Disposition Plan: Likely discharge in the next 24-48 hours. Hospice to delivered equipment  Diet: Diet heart healthy/carb modified Room service appropriate? Yes; Fluid consistency: Thin; Fluid restriction: 1200 mL Fluid  Consultants:   None  Procedures:   None  Antimicrobials:   None   Objective: Vitals:   08/21/16 2042 08/22/16 0426 08/22/16 1011 08/22/16 1128  BP:  (!) 119/48  (!) 126/51  Pulse: 66 (!) 59 65 (!) 58  Resp: 18 18 18 18   Temp:  98 F (36.7 C)  98.2 F (36.8 C)  TempSrc:  Oral  Oral  SpO2:  100% 100% 100%  Weight:  76.7 kg (169 lb 3.2 oz)    Height:        Intake/Output Summary (Last 24 hours) at 08/22/16 1845 Last data filed at 08/22/16 1739  Gross per 24 hour  Intake             1278 ml  Output             1100 ml  Net              178 ml   Filed Weights   08/20/16 0454 08/21/16 0607 08/22/16 0426  Weight: 76.9 kg (169 lb 8 oz) 77.2 kg (170 lb 1.6 oz) 76.7 kg (169 lb 3.2 oz)    Examination: General exam: Appears calm and comfortable; reports not significant SOB or CP. Respiratory system: Clear to auscultation. Respiratory effort normal. Using 2L nasal canula O2 supplementation Cardiovascular system: S1 & S2 heard, RRR. No JVD, murmurs, rubs, gallops or clicks. Trace pedal edema bilaterally appreciated. Gastrointestinal system: Abdomen is nondistended, soft and nontender. No organomegaly or masses felt. Normal bowel sounds heard. Central nervous system: Alert and oriented. No focal neurological deficits. Extremities: Symmetric 5 x 5  power. Skin: No rashes, lesions or ulcers Psychiatry: Judgement and insight appear normal. Mood & affect appropriate.   Data Reviewed: I have personally reviewed following labs and imaging studies  CBC:  Recent Labs Lab 08/17/16 1548  08/18/16 0245 08/19/16 0221 08/20/16 0420 08/21/16 0338 08/22/16 0314  WBC 6.4  --  5.9 6.0 7.1 6.8 6.8  NEUTROABS 4.8  --   --   --   --   --   --   HGB 8.0*  < > 7.1* 7.0* 8.7* 8.5* 8.7*  HCT 26.5*  < > 23.2* 23.0* 27.8* 27.3* 28.1*  MCV 99.6  --  99.6 100.0 96.2 96.5 97.2  PLT 196  --  165 163 175 159 174  < > = values in this interval not displayed. Basic Metabolic Panel:  Recent Labs Lab 08/18/16 0245 08/19/16 0221 08/20/16 0420 08/21/16 0338 08/22/16 0314  NA 138 139 138 139 139  K 4.3 3.9 3.6 3.4* 3.5  CL 104 100* 101 100* 97*  CO2 22 25 22 26 28   GLUCOSE 101* 114* 134* 108* 91  BUN 104* 108* 114* 119* 116*  CREATININE 5.66* 5.77* 5.66* 5.51* 5.47*  CALCIUM 9.2 9.4 9.5 9.6 9.7   GFR: Estimated Creatinine Clearance: 6.4 mL/min (by  C-G formula based on SCr of 5.47 mg/dL).   Liver Function Tests:  Recent Labs Lab 08/17/16 1548  AST 35  ALT 48  ALKPHOS 58  BILITOT 0.8  PROT 6.9  ALBUMIN 3.9   Coagulation Profile:  Recent Labs Lab 08/18/16 0245 08/19/16 0221 08/20/16 0420 08/21/16 0338 08/22/16 0314  INR 3.08 3.52 2.98 2.55 2.45   Cardiac Enzymes:  Recent Labs Lab 08/17/16 1548  TROPONINI 0.05*   CBG:  Recent Labs Lab 08/21/16 1712 08/21/16 2056 08/22/16 0555 08/22/16 1126 08/22/16 1629  GLUCAP 92 113* 82 183* 127*   Urine analysis:    Component Value Date/Time   COLORURINE YELLOW 08/17/2016 1645   APPEARANCEUR CLOUDY (A) 08/17/2016 1645   LABSPEC 1.016 08/17/2016 1645   PHURINE 5.0 08/17/2016 1645   GLUCOSEU NEGATIVE 08/17/2016 1645   HGBUR NEGATIVE 08/17/2016 1645   BILIRUBINUR NEGATIVE 08/17/2016 1645   KETONESUR NEGATIVE 08/17/2016 1645   PROTEINUR NEGATIVE 08/17/2016 1645    UROBILINOGEN 0.2 06/11/2015 1042   NITRITE NEGATIVE 08/17/2016 1645   LEUKOCYTESUR NEGATIVE 08/17/2016 1645   Sepsis Labs: @LABRCNTIP (procalcitonin:4,lacticidven:4)  ) Recent Results (from the past 240 hour(s))  Culture, blood (Routine X 2)     Status: None   Collection Time: 08/17/16  3:25 PM  Result Value Ref Range Status   Specimen Description BLOOD RIGHT HAND  Final   Special Requests BLOOD AEROBIC BOTTLE 6CC  Final   Culture NO GROWTH 5 DAYS  Final   Report Status 08/22/2016 FINAL  Final  Culture, blood (Routine X 2)     Status: None   Collection Time: 08/17/16  3:48 PM  Result Value Ref Range Status   Specimen Description BLOOD LEFT FOREARM  Final   Special Requests BOTTLES DRAWN AEROBIC AND ANAEROBIC 5CC  Final   Culture NO GROWTH 5 DAYS  Final   Report Status 08/22/2016 FINAL  Final  Urine culture     Status: Abnormal   Collection Time: 08/17/16  4:45 PM  Result Value Ref Range Status   Specimen Description URINE, CLEAN CATCH  Final   Special Requests NONE  Final   Culture MULTIPLE SPECIES PRESENT, SUGGEST RECOLLECTION (A)  Final   Report Status 08/18/2016 FINAL  Final     Invalid input(s): PROCALCITONIN, LACTICACIDVEN   Radiology Studies: No results found.  Scheduled Meds: . albuterol  2.5 mg Nebulization BID  . atorvastatin  80 mg Oral q morning - 10a  . carvedilol  12.5 mg Oral BID  . docusate sodium  100 mg Oral BID  . furosemide  160 mg Intravenous Q8H  . insulin aspart  0-9 Units Subcutaneous TID WC  . isosorbide mononitrate  30 mg Oral Daily  . multivitamin  1 tablet Oral q morning - 10a  . paricalcitol  1 mcg Oral Daily  . sodium chloride flush  3 mL Intravenous Q12H  . Warfarin - Pharmacist Dosing Inpatient   Does not apply q1800   Continuous Infusions:    LOS: 5 days    Time spent: 25 minutes    Vassie Loll, MD [facility] Pager [phone]  If 7PM-7AM, please contact night-coverage [email or web address] Password [facility] 08/22/2016,  6:45 PM

## 2016-08-22 NOTE — Evaluation (Signed)
Physical Therapy Evaluation Patient Details Name: Claretta FraiseCorine H Tax MRN: [id number] DOB: May 14, 1926 Today's Date: 08/22/2016   History of Present Illness  82 yo female admitted from home with acute on chronic CHF. pt with anemia secondary to CKD stage IV per  notes. PMH: anemia, CHF, carotid stenosis, CAD, HTN, heart block with pacemaker, stroke, asthma  Clinical Impression  Pt admitted with above diagnosis. Pt currently with functional limitations due to the deficits listed below (see PT Problem List).  Pt will benefit from skilled PT to increase their independence and safety with mobility to allow discharge to the venue listed below.  Pt ambulates and then all of a sudden gets weak and shaky and needs to sit quickly.  Pt would like to go home, but recommending SNF at this time.  If pt improves and family can provide 24 hour A with understanding of need for A with gait, then this may be an option, but at this time recommend SNF.     Follow Up Recommendations SNF;Supervision/Assistance - 24 hour    Equipment Recommendations  None recommended by PT    Recommendations for Other Services       Precautions / Restrictions Precautions Precautions: Fall      Mobility  Bed Mobility               General bed mobility comments: Pt sitting up in recliner upon arrival  Transfers Overall transfer level: Needs assistance Equipment used: Rolling walker (2 wheeled) Transfers: Sit to/from Stand Sit to Stand: Min assist;+2 physical assistance;+2 safety/equipment;Mod assist         General transfer comment: cues for hand placement. Sit > stand with MIN of 2, Stand > sit with MOD of 2 with shakiness and weakness noted.  Ambulation/Gait Ambulation/Gait assistance: Min assist;+2 safety/equipment Ambulation Distance (Feet): 12 Feet Assistive device: Rolling walker (2 wheeled) Gait Pattern/deviations: Decreased step length - right;Decreased step length - left Gait velocity: decreased    General Gait Details: Pt ambulated from recliner around bed to sink and then legs became very shaky.  Pt becoming noticeably weak and sat in chair with MOD A of 2. o2 sat 94% on 2 L.  On return trip, pt needed chair brought to her prior to turning  and sitting in recliner.   Stairs            Wheelchair Mobility    Modified Rankin (Stroke Patients Only)       Balance Overall balance assessment: Needs assistance Sitting-balance support: Feet supported Sitting balance-Leahy Scale: Fair     Standing balance support: Bilateral upper extremity supported Standing balance-Leahy Scale: Poor Standing balance comment: Pt unable to perform any tasks with unilateral UE support.                             Pertinent Vitals/Pain Pain Assessment: No/denies pain    Home Living Family/patient expects to be discharged to:: Skilled nursing facility                 Additional Comments: pt from home. pt recent admit for CVA and d/[facility] to [facility] [place]. pt now at home alone during the day with daughter living near by checking on her throughout the day. grandson works and lives with her at night. pt is at home alone during parts of the day .     Prior Function     Gait / Transfers Assistance Needed: has a RW and W/c  but reports that she furniture walks about the house. pt reports using w/c for appointments  ADL's / Homemaking Assistance Needed: pt sponge bath on a seat in bathroom.  Pt requires (A) for all adls and Iadls.         Hand Dominance   Dominant Hand: Right    Extremity/Trunk Assessment   Upper Extremity Assessment: Defer to OT evaluation           Lower Extremity Assessment: Generalized weakness;Overall [facility] for tasks assessed      Cervical / Trunk Assessment: Kyphotic  Communication   Communication: Expressive difficulties  Cognition Arousal/Alertness: Awake/alert Behavior During Therapy: WFL for tasks assessed/performed Overall Cognitive Status:  Within Functional Limits for tasks assessed                      General Comments General comments (skin integrity, edema, etc.): Pt pleasant and cooperative.    Exercises        Assessment/Plan    PT Assessment Patient needs continued PT services  PT Diagnosis Difficulty walking;Generalized weakness   PT Problem List Decreased strength;Decreased activity tolerance;Decreased balance;Decreased mobility  PT Treatment Interventions DME instruction;Gait training;Functional mobility training   PT Goals (Current goals can be found in the Care Plan section) Acute Rehab PT Goals Patient Stated Goal: Wants to go home PT Goal Formulation: With patient Time For Goal Achievement: 09/05/16 Potential to Achieve Goals: Good    Frequency Min 3X/week   Barriers to discharge   1 step to enter home    Co-evaluation PT/OT/SLP Co-Evaluation/Treatment: Yes Reason for Co-Treatment: For patient/therapist safety PT goals addressed during session: Mobility/safety with mobility;Proper use of DME         End of Session Equipment Utilized During Treatment: Gait belt;Oxygen Activity Tolerance: Patient limited by fatigue Patient left: in chair;with call bell/phone within reach;with chair alarm set Nurse Communication: Mobility status         Time: [id number] PT Time Calculation (min) (ACUTE ONLY): 29 min   Charges:   PT Evaluation $PT Eval Moderate Complexity: 1 Procedure     PT G Codes:        Courtnei Ruddell LUBECK 08/22/2016, 11:09 AM

## 2016-08-22 NOTE — Progress Notes (Signed)
Occupational Therapy Treatment Patient Details Name: Dorothy Bartlett MRN: [id number] DOB: 22-Feb-1926 Today's Date: 08/22/2016    History of present illness 82 yo female admitted from home with acute on chronic CHF. pt with anemia secondary to CKD stage IV per  notes. PMH: anemia, CHF, carotid stenosis, CAD, HTN, heart block with pacemaker, stroke, asthma   OT comments  Patient needs assistance with all mobility and most ADLs at this time. She currently does not have 24/7 A at home during the day. Her grandson stays with her at night. Recommend SNF.   Follow Up Recommendations  SNF    Equipment Recommendations  Other (comment) (defer to SNF)    Recommendations for Other Services      Precautions / Restrictions Precautions Precautions: Fall Precaution Comments: watch O2 Restrictions Weight Bearing Restrictions: No       Mobility Bed Mobility               General bed mobility comments: Pt sitting up in recliner upon arrival  Transfers Overall transfer level: Needs assistance Equipment used: Rolling walker (2 wheeled) Transfers: Sit to/from Stand Sit to Stand: Min assist;Mod assist;+2 physical assistance;+2 safety/equipment         General transfer comment: cues for hand placement. Sit > stand with MIN of 2, Stand > sit with MOD of 2 with shakiness and weakness noted.    Balance                             ADL Overall ADL's : Needs assistance/impaired     Grooming: Wash/dry hands;Wash/dry face;Set up;Sitting           Upper Body Dressing : Moderate assistance;Sitting Upper Body Dressing Details (indicate cue type and reason): don gown as robe     Toilet Transfer: Minimal assistance;Moderate assistance;+2 for physical assistance;+2 for safety/equipment;Cueing for safety;Stand-pivot;Ambulation;BSC;RW           Functional mobility during ADLs: Minimal assistance;Moderate assistance;+2 for physical assistance;+2 for  safety/equipment;Cueing for safety;Rolling walker General ADL Comments: Attempted ambulation recliner to sink for grooming in standing. Patient's legs became very shaky and weak upon getting to sink and needed chair pulled up behind her. Performed grooming in sitting. After a rest break, pt ambulated back to recliner, but required chair to be pulled up behind her again due to legs getting shaky. After rest break, transferred chair to recliner. O2 sats 94% with mobility.      Vision                     Perception     Praxis      Cognition   Behavior During Therapy: WFL for tasks assessed/performed Overall Cognitive Status: Within Functional Limits for tasks assessed                       Extremity/Trunk Assessment  Upper Extremity Assessment Upper Extremity Assessment: Generalized weakness   Lower Extremity Assessment Lower Extremity Assessment: Generalized weakness;Overall [facility] for tasks assessed   Cervical / Trunk Assessment Cervical / Trunk Assessment: Kyphotic    Exercises     Shoulder Instructions       General Comments      Pertinent Vitals/ Pain       Pain Assessment: No/denies pain  Home Living Family/patient expects to be discharged to:: Skilled nursing facility  Additional Comments: pt from home. pt recent admit for CVA [facility] [place]. pt now at home alone during the day with daughter living near by checking on her throughout the day. grandson works and lives with her at night. pt is at home alone during parts of the day .       Prior Functioning/Environment    Gait / Transfers Assistance Needed: has a RW and W/c but reports that she furniture walks about the house. pt reports using w/c for appointments ADL's / Homemaking Assistance Needed: pt sponge bath on a seat in bathroom.  Pt requires (A) for all adls and Iadls.  Communication / Swallowing Assistance Needed: pt noted to have poor  fitting lower denture     Frequency Min 2X/week     Progress Toward Goals  OT Goals(current goals can now be found in the care plan section)  Progress towards OT goals: Progressing toward goals  Acute Rehab OT Goals Patient Stated Goal: Wants to go home  Plan Discharge plan remains appropriate    Co-evaluation    PT/OT/SLP Co-Evaluation/Treatment: Yes Reason for Co-Treatment: For patient/therapist safety PT goals addressed during session: Mobility/safety with mobility OT goals addressed during session: ADL's and self-care      End of Session Equipment Utilized During Treatment: Gait belt;Rolling walker;Oxygen   Activity Tolerance Patient tolerated treatment well   Patient Left in chair;with call bell/phone within reach;with chair alarm set   Nurse Communication Mobility status        Time: [id number] OT Time Calculation (min): 25 min  Charges: OT General Charges $OT Visit: 1 Procedure OT Treatments $Therapeutic Activity: 8-22 mins  Frederico Gerling A 08/22/2016, 12:44 PM

## 2016-08-22 NOTE — Clinical Social Work Note (Signed)
CSW called patient's daughter for assessment. Left voicemail.  Charlynn CourtSarah Carolann Brazell, CSW [phone]

## 2016-08-22 NOTE — Progress Notes (Signed)
ANTICOAGULATION CONSULT NOTE - Follow Up Consult  Pharmacy Consult for warfarin Indication: atrial fibrillation  No Known Allergies  Patient Measurements: Height: 4\' 11"  (149.9 cm) Weight: 169 lb 3.2 oz (76.7 kg) (scale a) IBW/kg (Calculated) : 43.2  Vital Signs: Temp: 98 F (36.7 C) (08/23 0426) Temp Source: Oral (08/23 0426) BP: 119/48 (08/23 0426) Pulse Rate: 65 (08/23 1011)  Labs:  Recent Labs  08/20/16 0420 08/21/16 0338 08/22/16 0314  HGB 8.7* 8.5* 8.7*  HCT 27.8* 27.3* 28.1*  PLT 175 159 174  LABPROT 31.7* 27.9* 27.0*  INR 2.98 2.55 2.45  CREATININE 5.66* 5.51* 5.47*    Estimated Creatinine Clearance: 6.4 mL/min (by C-G formula based on SCr of 5.47 mg/dL).   Assessment: 82 yo f here with worsening SOB and weakness over the last week along with nonproductive cough. Warfarin PTA for afib. Recent admission for CVA suspected secondary to such. CHA2DS2-VASc = 9.   INR today remains therapeutic at 2.45. H/H low stable. Plt WNL. S/P 2 unit PRBC 8/20.   PTA dose: 6mg  daily except 3mg  on Wednesday and Saturday (36 mg/wk)  Goal of Therapy:  INR 2-3 Monitor platelets by anticoagulation protocol: Yes   Plan:  Warfarin 6 mg PO tonight Daily INR Monitor for s/sx of bleeding   Loura BackJennifer [place], PharmD, BCPS Clinical Pharmacist Phone for today [phone] Main pharmacy - [phone] 08/22/2016 11:18 AM

## 2016-08-22 NOTE — Progress Notes (Signed)
OT NOTE late entry    08/21/16 1500  OT Visit Information  Last OT Received On 08/21/16  Assistance Needed +2 (+1 with sara stedy)  History of Present Illness 82 yo female admitted from home with acute on chronic CHF. pt with anemia secondary to CKD stage IV per  notes. PMH:  Precautions  Precautions Fall  Precaution Comments watch O2  Home Living  Family/patient expects to be discharged to: Skilled nursing facility  Additional Comments pt from home. pt recent admit for CVA and [facility] [place]. pt now at home alone during the day with daughter living near by checking on her throughout the day. grandson works and lives with her at night. pt is at home alone during parts of the day .   Prior Function  Level of Independence Needs assistance  Gait / Transfers Assistance Needed has a RW and W/c but reports that she furniture walks about the house. pt reports using w/c for appointments  ADL's / Homemaking Assistance Needed pt sponge bath on a seat in bathroom.  Pt requires (A) for all adls and Iadls.   Communication / Swallowing Assistance Needed pt noted to have poor fitting lower denture  Comments pt reports "i am 82 yo and I am just aging out"  Communication  Communication Expressive difficulties (moving denture on lower affecting speech)  Pain Assessment  Pain Assessment No/denies pain  Cognition  Arousal/Alertness Awake/alert  Behavior During Therapy WFL for tasks assessed/performed  Overall Cognitive Status Within Functional Limits for tasks assessed  Upper Extremity Assessment  Upper Extremity Assessment Generalized weakness  Lower Extremity Assessment  Lower Extremity Assessment Generalized weakness;Defer to PT evaluation  Cervical / Trunk Assessment  Cervical / Trunk Assessment Kyphotic  ADL  Overall ADL's  Needs assistance/impaired  General ADL Comments pt able to complete bed mobility with supervision and incr time. pt reports "thank you jesus" pt reports that LE just give  out without warning. pt describes being able to stand and then suddenly loss of ability to sustain static standing. Recommending RN staff use Huntley DecSara stedy with patient at this time to prevent fall. Pt able to demo use of sara stedy this session with one person (A).   Bed Mobility  Overal bed mobility Needs Assistance  Bed Mobility Supine to Sit;Rolling;Sit to Supine  Rolling Supervision  Supine to sit Supervision  Sit to supine Min assist  General bed mobility comments (A) for bil Le to return to supine  Transfers  Overall transfer level Needs assistance  Transfer via Lift Equipment Stedy  Transfers Sit to/from Stand  Sit to Stand Min assist;From elevated surface  General transfer comment pt with elevated surface due to height and positioning of the bed for stedy  Balance  Overall balance assessment Needs assistance  Sitting-balance support Bilateral upper extremity supported;Feet supported  Sitting balance-Leahy Scale Fair  Standing balance support Bilateral upper extremity supported;During functional activity  Standing balance-Leahy Scale Poor  OT - End of Session  Equipment Utilized During Treatment Gait belt;Oxygen  Activity Tolerance Patient limited by fatigue  Patient left in bed;with call bell/phone within reach;with bed alarm set  Nurse Communication Mobility status;Precautions  OT Assessment  OT Therapy Diagnosis  Generalized weakness  OT Recommendation/Assessment Patient needs continued OT Services  OT Problem List Decreased strength;Decreased activity tolerance;Impaired balance (sitting and/or standing);Decreased safety awareness;Decreased knowledge of use of DME or AE;Decreased knowledge of precautions;Cardiopulmonary status limiting activity  Barriers to Discharge Decreased caregiver support  Barriers to Discharge Comments will  be home at times alone. high fall risk   OT Plan  OT Frequency (ACUTE ONLY) Min 2X/week  OT Treatment/Interventions (ACUTE ONLY) Self-care/ADL  training;Therapeutic exercise;Neuromuscular education;DME and/or AE instruction;Energy conservation;Therapeutic activities;Visual/perceptual remediation/compensation;Patient/family education;Balance training  OT Recommendation  Follow Up Recommendations SNF  OT Equipment Other (comment) (defer to SNF)  Individuals Consulted  Consulted and Agree with Results and Recommendations Patient  Acute Rehab OT Goals  Patient Stated Goal none stated at this time  OT Goal Formulation With patient  Time For Goal Achievement 09/04/16  Potential to Achieve Goals Good  OT Time Calculation  OT Start Time (ACUTE ONLY) 1455  OT Stop Time (ACUTE ONLY) 1515  OT Time Calculation (min) 20 min  OT General Charges  $OT Visit 1 Procedure  OT Evaluation  $OT Eval Moderate Complexity 1 Procedure  Written Expression  Dominant Hand Right    Mateo FlowJones, Brynn   OTR/L Pager: [phone] Office: [phone] .

## 2016-08-23 ENCOUNTER — Encounter (HOSPITAL_COMMUNITY): Payer: Medicare Other

## 2016-08-23 DIAGNOSIS — Z950 Presence of cardiac pacemaker: Secondary | ICD-10-CM

## 2016-08-23 LAB — BASIC METABOLIC PANEL
ANION GAP: 14 (ref 5–15)
BUN: 117 mg/dL — ABNORMAL HIGH (ref 6–20)
CHLORIDE: 95 mmol/L — AB (ref 101–111)
CO2: 28 mmol/L (ref 22–32)
Calcium: 9.6 mg/dL (ref 8.9–10.3)
Creatinine, Ser: 5.16 mg/dL — ABNORMAL HIGH (ref 0.44–1.00)
GFR calc Af Amer: 8 mL/min — ABNORMAL LOW (ref >60)
GFR, EST NON AFRICAN AMERICAN: 7 mL/min — AB (ref >60)
Glucose, Bld: 82 mg/dL (ref 65–99)
POTASSIUM: 3.1 mmol/L — AB (ref 3.5–5.1)
SODIUM: 137 mmol/L (ref 135–145)

## 2016-08-23 LAB — CBC
HCT: 27.5 % — ABNORMAL LOW (ref 36.0–46.0)
HEMOGLOBIN: 8.7 g/dL — AB (ref 12.0–15.0)
MCH: 30.6 pg (ref 26.0–34.0)
MCHC: 31.6 g/dL (ref 30.0–36.0)
MCV: 96.8 fL (ref 78.0–100.0)
PLATELETS: 168 10*3/uL (ref 150–400)
RBC: 2.84 MIL/uL — AB (ref 3.87–5.11)
RDW: 15.5 % (ref 11.5–15.5)
WBC: 6.5 10*3/uL (ref 4.0–10.5)

## 2016-08-23 LAB — GLUCOSE, CAPILLARY
Glucose-Capillary: 79 mg/dL (ref 65–99)
Glucose-Capillary: 162 mg/dL — ABNORMAL HIGH (ref 65–99)

## 2016-08-23 LAB — PROTIME-INR
INR: 2.27
PROTHROMBIN TIME: 25.4 s — AB (ref 11.4–15.2)

## 2016-08-23 MED ORDER — TORSEMIDE 20 MG PO TABS: 80.0000 mg | ORAL_TABLET | Freq: Two times a day (BID) | ORAL | 1 refills | Status: DC

## 2016-08-23 MED ORDER — WARFARIN SODIUM 3 MG PO TABS: 6.0000 mg | ORAL_TABLET | Freq: Once | ORAL | Status: DC

## 2016-08-23 NOTE — Care Management Note (Signed)
Case Management Note  Patient Details  Name: Dorothy Bartlett MRN: [id number] Date of Birth: [birth year]  Subjective/Objective:   Admitted with CHF                Action/Plan: Patient is to return home with Hospice Care; CM talked to daughter Landis Gandyancy McCoy (Daughter) to offer home hospice choice; Harriett Sineancy stated that she needed to talk to her brother and will be at the hospital around 11:30 am to talk more about home hospice choice.  1239- Talked to patient's daughter at the bedside for Home Hospice Choice, Harriett Sineancy [facility]. Referral placed as requested. Stacie with [facility] to see patient today. Also a Insurance underwriterprivate sitter list given to the daughter to provide additional service/ help in the home.   Expected Discharge Date:    possible 08/23/2016              Expected Discharge Plan:  Home w Hospice Care  In-House Referral:  Clinical Social Work  Discharge planning Services  CM Consult  Post Acute Care Choice:    Choice offered to:  Adult Children   HH Agency:   [facility]  Status of Service:  In process, will continue to follow  Reola MosherChandler, Lavida Patch L, RN,MHA,BSN [phone] 08/23/2016, 10:04 AM Admitted

## 2016-08-23 NOTE — Discharge Summary (Signed)
Physician Discharge Summary  Claretta FraiseCorine H Ramnauth UJW:[id number] DOB: 03/11/1926 DOA: 08/17/2016  PCP: Eliott NineLEAGUE-SOBON, JENNIFER, MD  Admit date: 08/17/2016 Discharge date: 08/23/2016  Time spent: 35 minutes  Recommendations for Outpatient Follow-up:  1. Repeat BMET to follow electrolytes and renal function 2. Slowly transition to hospice and comfort care.   Discharge Diagnoses:  Active Problems:   CAD S/P PTCA 1995   Type 2 diabetes mellitus with renal manifestations (HCC)   Anemia due to pre-end-stage renal disease treated with erythropoietin   Pacemaker-[employer] May 2016   Cardiomyopathy-EF 40-45% by echo May 2016   Secondary hyperparathyroidism (HCC)   Acute on chronic combined systolic and diastolic heart failure (HCC)   Essential hypertension   Stroke (HCC)   HLD (hyperlipidemia)   Paroxysmal atrial fibrillation (HCC)   CHF (congestive heart failure) (HCC)   Discharge Condition: stable and improved. Discharge home with arrangements to be follow by hospice. Slowly transitioning to comfort care/symptoms management.  Diet recommendation: low sodium and modified carbohydrates   Filed Weights   08/21/16 0607 08/22/16 0426 08/23/16 0346  Weight: 77.2 kg (170 lb 1.6 oz) 76.7 kg (169 lb 3.2 oz) 76 kg (167 lb 8 oz)    History of present illness:  82 y.o. female, With past medical history of hyperlipidemia, hypertension, DM 2, multivessel CAD, chronic combined systolic and diastolic heart failure, permanent pacemaker for high degree AV block, PVD,obesity, intrinsic asthma, &stage V chronic kidney disease. , A. fib on warfarin for anticoagulation, recent admission for CVA thought to be secondary to A. Fib. Patient presents secondary to complaints of worsening shortness of breath over the last week, progressive weakness, cough, nonproductive, worsening lower extremity edema, as well reports orthopnea, reports chills, prescribed oral doxycycline by her PCP yesterday for presumed pneumonia,  patient reports she is compliant with her medication, no excessive salt intake, she denies any chest pain, any coffee-ground emesis, any hemoptysis. - in ED workup significant for elevated BNP, volume overload on chest x-ray, worsening renal function, creatinine of 5.7, baseline is 4.9, EKG showing paced rhythm, patient admitted for CHF exacerbation.  Hospital Course:   Acute on chronic systolic and diastolic combined CHF - With worsening lower extremity edema, dyspnea, CXR with evidence of volume overload, significantly elevated BNP on admission . - Will recommend low sodium diet and daily weights  - No ACE inhibitor , no ARB given her renal failure, continue beta blockers - Was on doxycycline for presumed pneumonia, this is discontinued because of lack of WBC, fever or CXR findings. -difficult situation with cardiorenal syndrome and inability for optimal diuresis with medications -case was Discussed with Dr. Briant CedarMattingly over the phone, Lasix 160 mg IV twice a day, initially with very minimal difference. Not a candidate for HD -switched now to demadex oral 80mg  BID (as equivalent dose) -hospice at home and outpatient follow up with her PCP and nephrologist. -follow low sodium diet and check weight on daily basis   AKI on CKD stage V -Patient with worsening renal function, possibly due to cardiac renal syndrome. -Per nephrology she is not dialysis candidate, discussed GOC of patient and family and they have opted for hospice at home -will switch to demadex 80mg  BID as recommended by nephrology now -advise to follow low sodium diet   Anemia in chronic kidney disease -Continue with Aranesp if ok by nephrology -Status post transfusion of 1 unit of packed RBCs. -last Hgb stable at 8.7  Chronic respiratory failure - At baseline, on 2 L nasal cannula -continue  maintaining current volume status as much as possible   Atrial fibrillation - Paced rhythm, continue with Coreg and warfarin,  pharmacy to dose - CHA2DS2-VASc is at least 8 (gender, DM, HTN, CHF, PVD, CAD and CVA).  Diabetes mellitus type 2 -resume low dose Januvia -advise to follow low carb diet   Hyperlipidemia -given the fact that patient is focusing more into comfort care and symptoms management; will hold statins in attempt to minimize meds.  Stroke -Patient with recent hospitalization secondary to stroke, thought to be secondary to A. Fib, continue warfarin for now. Discussed with PCP about continuation of  -no new focal deficit -patient transitioning to comfort/symptomatic management   Asthma -Continue with nebs when necessary -no wheezing   Hypertension - Blood pressure acceptable, continue with Coreg and demadex now  Right lung abnormalities during previous imaging -initially recommended to Repeat CT chest in 3 months from the original CT, this would be late September/early October. -patient is now enrolled into hospice and plan is for comfort care and symptoms management  -patient and family are not interested in any further workup/invasive treatment   Secondary hyperparathyroidism - Continue with Zemplar  Status post permanent pacemaker  Procedures:  See below for x-ray reports   Consultations:  Nephrology (Curbside Dr. Briant Cedar)  Discharge Exam: Vitals:   08/23/16 0346 08/23/16 1205  BP: (!) 114/43 121/64  Pulse: (!) 59 (!) 58  Resp: 18   Temp: 98.4 F (36.9 C) 97.9 F (36.6 C)   General exam: Appears calm and comfortable; reports no SOB or CP. Respiratory system: Clear to auscultation. Respiratory effort normal. Using 2L nasal canula O2 supplementation. No using accessory muscles  Cardiovascular system: S1 & S2 heard, RRR. No JVD, murmurs, rubs, gallops or clicks. Trace pedal edema bilaterally appreciated. Gastrointestinal system: Abdomen is nondistended, soft and nontender. No organomegaly or masses felt. Normal bowel sounds heard. Central nervous system: Alert and  oriented. No focal neurological deficits. Extremities: Symmetric 5 x 5 power. Skin: No rashes, lesions or ulcers Psychiatry: Judgement and insight appear normal. Mood & affect appropriate.    Discharge Instructions   Discharge Instructions    Diet - low sodium heart healthy    Complete by:  As directed   Discharge instructions    Complete by:  As directed   Arrange follow up with PCP in 10 days Follow instructions and advises from Hospice service Watch the amount of sodium in your diet Continue oxygen supplementation 2L 24/7 now.     Current Discharge Medication List    START taking these medications   Details  torsemide (DEMADEX) 20 MG tablet Take 4 tablets (80 mg total) by mouth 2 (two) times daily. Qty: 60 tablet, Refills: 1      CONTINUE these medications which have NOT CHANGED   Details  albuterol (PROVENTIL HFA;VENTOLIN HFA) 108 (90 BASE) MCG/ACT inhaler Inhale 2 puffs into the lungs every 6 (six) hours as needed for wheezing or shortness of breath.    B Complex-C-Folic Acid (RENA-VITE RX) 1 MG TABS Take 1 tablet by mouth every morning.     carvedilol (COREG) 12.5 MG tablet Take 1 tablet (12.5 mg total) by mouth 2 (two) times daily. Qty: 180 tablet, Refills: 1   Associated Diagnoses: Essential hypertension, benign    Darbepoetin Alfa-Albumin (ARANESP IJ) Inject as directed every 14 (fourteen) days.    docusate sodium (COLACE) 100 MG capsule Take 100 mg by mouth 2 (two) times daily.     guaiFENesin (ROBITUSSIN) 100 MG/5ML SOLN  Take 10 mLs by mouth every 4 (four) hours as needed for cough or to loosen phlegm.    hydrALAZINE (APRESOLINE) 10 MG tablet Take 1 tablet (10 mg total) by mouth 3 (three) times daily. Qty: 270 tablet, Refills: 3    isosorbide mononitrate (IMDUR) 30 MG 24 hr tablet Take 1 tablet (30 mg total) by mouth daily. Qty: 90 tablet, Refills: 3    JANUVIA 25 MG tablet Take 25 mg by mouth daily.    loratadine (CLARITIN) 10 MG tablet Take 10 mg by  mouth daily as needed for allergies.    Melatonin 3 MG TABS Take 3 mg by mouth at bedtime as needed (for sleep).     nitroGLYCERIN (NITROSTAT) 0.4 MG SL tablet Place 1 tablet (0.4 mg total) under the tongue every 5 (five) minutes x 3 doses as needed for chest pain. Qty: 25 tablet, Refills: 3    paricalcitol (ZEMPLAR) 1 MCG capsule Take 1 mcg by mouth daily.     potassium chloride SA (K-DUR,KLOR-CON) 20 MEQ tablet Take 1 tablet (20 mEq total) by mouth once. Qty: 15 tablet, Refills: 0    Vitamin D, Ergocalciferol, (DRISDOL) 50000 UNITS CAPS capsule Take 1 capsule (50,000 Units total) by mouth every 7 (seven) days. Qty: 10 capsule, Refills: 0    warfarin (COUMADIN) 6 MG tablet Take as directed by coumadin clinic Qty: 35 tablet, Refills: 0      STOP taking these medications     atorvastatin (LIPITOR) 80 MG tablet      doxycycline (VIBRA-TABS) 100 MG tablet      furosemide (LASIX) 80 MG tablet        No Known Allergies Follow-up Information    LEAGUE-SOBON, JENNIFER, MD. Schedule an appointment as soon as possible for a visit in 1 week(s).   Specialty:  Family Medicine Contact information: [address] [place] [address] [phone]           The results of significant diagnostics from this hospitalization (including imaging, microbiology, ancillary and laboratory) are listed below for reference.    Significant Diagnostic Studies: Dg Chest 2 View  Result Date: 08/17/2016 CLINICAL DATA:  Weakness and shortness of breath for 1 day. Congestive heart failure. EXAM: CHEST  2 VIEW COMPARISON:  Two-view chest x-ray 08/16/2016 FINDINGS: The heart is enlarged. Pacing wires are stable. Atherosclerotic changes are noted at the aortic arch. Right greater than left pleural effusions are present. These are slightly decreased from prior exam. Previously noted edema is slightly improved as well. IMPRESSION: 1. Cardiomegaly with slightly improved edema. 2. Slight improved bilateral  effusions, right greater than left and associated airspace disease, likely atelectasis. Electronically Signed   By: Marin Roberts M.D.   On: 08/17/2016 16:30    Microbiology: Recent Results (from the past 240 hour(s))  Culture, blood (Routine X 2)     Status: None   Collection Time: 08/17/16  3:25 PM  Result Value Ref Range Status   Specimen Description BLOOD RIGHT HAND  Final   Special Requests BLOOD AEROBIC BOTTLE 6CC  Final   Culture NO GROWTH 5 DAYS  Final   Report Status 08/22/2016 FINAL  Final  Culture, blood (Routine X 2)     Status: None   Collection Time: 08/17/16  3:48 PM  Result Value Ref Range Status   Specimen Description BLOOD LEFT FOREARM  Final   Special Requests BOTTLES DRAWN AEROBIC AND ANAEROBIC 5CC  Final   Culture NO GROWTH 5 DAYS  Final  Report Status 08/22/2016 FINAL  Final  Urine culture     Status: Abnormal   Collection Time: 08/17/16  4:45 PM  Result Value Ref Range Status   Specimen Description URINE, CLEAN CATCH  Final   Special Requests NONE  Final   Culture MULTIPLE SPECIES PRESENT, SUGGEST RECOLLECTION (A)  Final   Report Status 08/18/2016 FINAL  Final     Labs: Basic Metabolic Panel:  Recent Labs Lab 08/19/16 0221 08/20/16 0420 08/21/16 0338 08/22/16 0314 08/23/16 0537  NA 139 138 139 139 137  K 3.9 3.6 3.4* 3.5 3.1*  CL 100* 101 100* 97* 95*  CO2 25 22 26 28 28   GLUCOSE 114* 134* 108* 91 82  BUN 108* 114* 119* 116* 117*  CREATININE 5.77* 5.66* 5.51* 5.47* 5.16*  CALCIUM 9.4 9.5 9.6 9.7 9.6   Liver Function Tests:  Recent Labs Lab 08/17/16 1548  AST 35  ALT 48  ALKPHOS 58  BILITOT 0.8  PROT 6.9  ALBUMIN 3.9   CBC:  Recent Labs Lab 08/17/16 1548  08/19/16 0221 08/20/16 0420 08/21/16 0338 08/22/16 0314 08/23/16 0537  WBC 6.4  < > 6.0 7.1 6.8 6.8 6.5  NEUTROABS 4.8  --   --   --   --   --   --   HGB 8.0*  < > 7.0* 8.7* 8.5* 8.7* 8.7*  HCT 26.5*  < > 23.0* 27.8* 27.3* 28.1* 27.5*  MCV 99.6  < > 100.0 96.2  96.5 97.2 96.8  PLT 196  < > 163 175 159 174 168  < > = values in this interval not displayed. Cardiac Enzymes:  Recent Labs Lab 08/17/16 1548  TROPONINI 0.05*   BNP: BNP (last 3 results)  Recent Labs  06/29/16 2237 08/17/16 1548  BNP 4,002.8* 4,317.4*   CBG:  Recent Labs Lab 08/22/16 1126 08/22/16 1629 08/22/16 2148 08/23/16 0715 08/23/16 1212  GLUCAP 183* 127* 146* 79 162*   Signed:  Vassie Loll MD.  [facility] 08/23/2016, 1:22 PM

## 2016-08-23 NOTE — Progress Notes (Signed)
Patient's daughter is requesting ambulance transportation home. [facility] called - will pick up patient at 3 pm as Harriett Sineancy (daughter requested). Abelino DerrickB Shakeyla Giebler [facility] Seaside HospitalRN,MHA,BSN [phone]

## 2016-08-23 NOTE — Care Management Important Message (Signed)
Important Message  Patient Details  Name: Dorothy Bartlett MRN: [id number] Date of Birth: June 28, 1926   Medicare Important Message Given:  Yes    Eila Runyan Stefan ChurchBratton 08/23/2016, 10:55 AM

## 2016-08-23 NOTE — Progress Notes (Signed)
Pt has orders to be discharged home with home hospice. Discharge instructions given and pt has no additional questions at this time. Medication regimen reviewed with pt and pt daughter. [name] verbalized understanding and has no additional questions. Telemetry box removed. IV removed and site in good condition. Pt stable and waiting for transportation.   Jilda PandaBethany Anaelle Dunton RN

## 2016-08-23 NOTE — Clinical Social Work Note (Signed)
Patient returning home with hospice. CSW signing off. Consult again if any social work needs arise.  Charlynn CourtSarah Maryjane Benedict, CSW [phone]

## 2016-08-23 NOTE — Progress Notes (Signed)
Notified by Jiles CrockerBrenda Chandler,  CMRN of family request [facility]. Chart and patient information currently under review to confirm hospice eligibility.  Spoke with the patient and her daughter, Dorothy Bartlett at bedside to initiate education related to hospice philosophy, services and team approach to care. Family verbalized understanding of the information provided. Per discussion plan is for discharge to home by [facility] possibly as early as this afternoon  Please send signed completed DNR form home with patient.  Patient will need prescriptions for discharge comfort medications.  DME needs discussed; patient already has what she needs in the home. No DME  ordered at this time.  [facility] equipment manager Jewel Kizzie BaneHughes notified and will contact [facility] to arrange delivery to the home.  The home address has been verified and is correct in the chart; Daughter Dorothy Bartlett  [phone] is family member to be contacted to schedule AV visit.  [facility] aware of the above.  Completed discharge summary will need [facility] at [phone] when final.  Please notify [facility] when patient is ready to leave unit at discharge-call [phone].  [facility] information and contact numbers have been given to daughter during visit.  Above information shared with Jiles CrockerBrenda Chandler,  CMRN.  Please call with any questions.  Thank you for the referral!   Dorothy NeriSusan Thomas, RN  [facility] Hospital Liaison  [phone]

## 2016-08-23 NOTE — Progress Notes (Signed)
ANTICOAGULATION CONSULT NOTE - Follow Up Consult  Pharmacy Consult for warfarin Indication: atrial fibrillation  No Known Allergies  Patient Measurements: Height: 4\' 11"  (149.9 cm) Weight: 167 lb 8 oz (76 kg) (scale a) IBW/kg (Calculated) : 43.2  Vital Signs: Temp: 98.4 F (36.9 C) (08/24 0346) Temp Source: Oral (08/24 0346) BP: 114/43 (08/24 0346) Pulse Rate: 59 (08/24 0346)  Labs:  Recent Labs  08/21/16 0338 08/22/16 0314 08/23/16 0537  HGB 8.5* 8.7* 8.7*  HCT 27.3* 28.1* 27.5*  PLT 159 174 168  LABPROT 27.9* 27.0* 25.4*  INR 2.55 2.45 2.27  CREATININE 5.51* 5.47* 5.16*    Estimated Creatinine Clearance: 6.7 mL/min (by C-G formula based on SCr of 5.16 mg/dL).   Assessment: 82 yo on Coumadin 6mg  daily exc 3mg  on Wed and Sat PTA for Afib. Recent admission for CVA suspected secondary to such. CHA2DS2-VASc = 9. INR on admit therapeutic at 2.73 but then increased for a couple days. INR today back in therapeutic range at 2.27. Hgb low but stable at 8.7, plts wnl.  Goal of Therapy:  INR 2-3 Monitor platelets by anticoagulation protocol: Yes   Plan:  Give coumadin 6mg  PO x 1 today Monitor daily INR, CBC, s/s of bleed  Enzo BiNathan Mete Purdum, PharmD, [facility] Clinical Pharmacist Pager [phone] 08/23/2016 7:55 AM

## 2016-08-27 ENCOUNTER — Ambulatory Visit (INDEPENDENT_AMBULATORY_CARE_PROVIDER_SITE_OTHER): Payer: Medicare Other | Admitting: Cardiology

## 2016-08-27 DIAGNOSIS — Z7901 Long term (current) use of anticoagulants: Secondary | ICD-10-CM

## 2016-08-27 DIAGNOSIS — I480 Paroxysmal atrial fibrillation: Secondary | ICD-10-CM

## 2016-08-27 DIAGNOSIS — I639 Cerebral infarction, unspecified: Secondary | ICD-10-CM

## 2016-08-27 LAB — POCT INR: INR: 3.7

## 2016-08-29 ENCOUNTER — Telehealth: Payer: Self-pay | Admitting: Interventional Cardiology

## 2016-08-29 NOTE — Telephone Encounter (Signed)
New message        Pt c/o medication issue:  1. Name of Medication: furosemide 2. How are you currently taking this medication (dosage and times per day)? 80mg  bid 3. Are you having a reaction (difficulty breathing--STAT)? no  4. What is your medication issue?  Pt was in the hosp 8-18 to 8-24.  While in hosp, she was changed from lasix to furosemide 80mg  bid.  She has 20mg  tablets.  Pt has trouble swallowing since her stroke.  The PCP would not change medication---deferred to cardiologist.  Pt only got a presc for 7 days of furosemide and is due to get a refill.  Please call an let them know if she can get medication that she will not have to take multiple pills.

## 2016-08-29 NOTE — Telephone Encounter (Signed)
Spoke with [name]. Adv her that I have looked over the patients recent hospital d/c summary wit Luna GlasgowLore Gerhardt, NP. Pt kidney function has worsened and is now concerned stage V CKD. Adv that they f/u with pt nephrologist on recommendation and Rx for Demadex, since the change was made by nephrology nor cardiology during hospitalization. Harriett Sineancy verbalized understanding.

## 2016-08-29 NOTE — Telephone Encounter (Signed)
Agree with this decision.

## 2016-08-30 ENCOUNTER — Telehealth: Payer: Self-pay | Admitting: Neurology

## 2016-08-30 NOTE — Telephone Encounter (Signed)
Pt's daughter Harriett Sineancy called to advise the pt had to go back to the hospital for fluid around the heart and lungs 8/18-24. Her kidneys are now worse because lasix was used to get the fluid out of the body in June which caused more damage to the kidneys. She is now in hospice on 08/24/16. The daughter said she is wheelchair bound, she can stand and take a few steps but does not have strength to use the walker anymore. She is wanting to know if Dr Roda ShuttersXu thinks she should still bring the patient to the stroke f/u appt on 09/06/16.  Please call

## 2016-08-30 NOTE — Telephone Encounter (Signed)
Please let the daughter know that I think pt should postpone her appointment with me for a later date if it is still desired. From stroke standpoint, I have no change for her medication and she is on the right regimen for stroke prevention. She really needs to work with cardiologist and nephrologist for renal failure and heart failure at this time. If she is in hospice now, she has another reason to postpone or cancel her 09/06/16 appointment with me. We want her to be comfort instead of suffering from transportation and in/out of clinic. Please send me best regards to Ms. Clardy and wish her feel better. Thanks.   Marvel PlanJindong Kaylen Nghiem, MD PhD Stroke Neurology 08/30/2016 6:02 PM

## 2016-08-31 NOTE — Telephone Encounter (Signed)
:  LFt vm for patients daughter to call back about cancelling appt with Dr.Xu on 09/06/2016.LEft on cell number.

## 2016-08-31 NOTE — Telephone Encounter (Signed)
LFt vm on pts daughter home number. Rn requested Dorothy Bartlett pts daughter call back on Tuesday about cancelling her moms appt.

## 2016-09-01 ENCOUNTER — Other Ambulatory Visit: Payer: Self-pay | Admitting: Interventional Cardiology

## 2016-09-04 ENCOUNTER — Ambulatory Visit (INDEPENDENT_AMBULATORY_CARE_PROVIDER_SITE_OTHER): Payer: Medicare Other | Admitting: Cardiology

## 2016-09-04 DIAGNOSIS — I480 Paroxysmal atrial fibrillation: Secondary | ICD-10-CM

## 2016-09-04 DIAGNOSIS — Z7901 Long term (current) use of anticoagulants: Secondary | ICD-10-CM

## 2016-09-04 DIAGNOSIS — I639 Cerebral infarction, unspecified: Secondary | ICD-10-CM

## 2016-09-04 LAB — POCT INR: INR: 2

## 2016-09-04 NOTE — Telephone Encounter (Signed)
Rn spoke with patients daughter Dorothy Bartlett about cancelling her moms appt for this week. Pt is on oxygen and seeing a cardiologist and nephrologist. Pt is on hospice care too. Rn stated per Dr. Roda ShuttersXu note pt can cancel and r/s If needed once she is feeling better. He wants pt be comfortable at all times. Pts daughter verbalized understanding and will call back to r/s stroke follow up.

## 2016-09-06 ENCOUNTER — Ambulatory Visit: Payer: Medicare Other | Admitting: Neurology

## 2016-09-07 ENCOUNTER — Ambulatory Visit: Payer: Medicare Other | Admitting: Neurology

## 2016-09-11 ENCOUNTER — Ambulatory Visit (INDEPENDENT_AMBULATORY_CARE_PROVIDER_SITE_OTHER): Payer: Medicare Other | Admitting: Cardiology

## 2016-09-11 DIAGNOSIS — I639 Cerebral infarction, unspecified: Secondary | ICD-10-CM

## 2016-09-11 DIAGNOSIS — I480 Paroxysmal atrial fibrillation: Secondary | ICD-10-CM

## 2016-09-11 DIAGNOSIS — Z7901 Long term (current) use of anticoagulants: Secondary | ICD-10-CM

## 2016-09-11 LAB — POCT INR: INR: 2.5

## 2016-09-13 ENCOUNTER — Encounter: Payer: Self-pay | Admitting: Podiatry

## 2016-09-13 ENCOUNTER — Ambulatory Visit (INDEPENDENT_AMBULATORY_CARE_PROVIDER_SITE_OTHER): Payer: Medicare Other | Admitting: Podiatry

## 2016-09-13 DIAGNOSIS — B351 Tinea unguium: Secondary | ICD-10-CM

## 2016-09-13 DIAGNOSIS — L600 Ingrowing nail: Secondary | ICD-10-CM

## 2016-09-13 MED ORDER — NABUMETONE 500 MG PO TABS: 500.0000 mg | ORAL_TABLET | Freq: Two times a day (BID) | ORAL | 1 refills | Status: DC

## 2016-09-13 MED ORDER — OXYCODONE-ACETAMINOPHEN 10-325 MG PO TABS: 1.0000 | ORAL_TABLET | Freq: Four times a day (QID) | ORAL | 0 refills | Status: DC | PRN

## 2016-09-13 NOTE — Patient Instructions (Signed)
Seen for hypertrophic nails. All nails debrided. Return in 3 months or as needed.  

## 2016-09-13 NOTE — Progress Notes (Signed)
Subjective:  82 year old female presents complaining of painful nails. She is accompanied by her daughter via wheel chair, requesting toe nails trimmed.  Just had stroke in 6/30 and got fluid in lung in mid August. Patient is wheelchair bound now.   Objective: Vascular: Pedal pulses are not palpable.  Positive of forefoot edema bilateral. Dermatologic: Discolored and hypertrophic nails x 9. Ingrown right great toe nail. Neurologic: All epicritic sensations grossly intact with hyperalgic foot left.  Orthopedic: Status post removal of 3rd digit right. Severe STJ pronation with flattening arch upon weight bearing.   Assessment:  Deformed mycotic nails x 9. Ingrown nails right great toe.   Plan:  All nails debrided. Return in 3 month or as needed

## 2016-09-18 ENCOUNTER — Ambulatory Visit (INDEPENDENT_AMBULATORY_CARE_PROVIDER_SITE_OTHER): Admitting: *Deleted

## 2016-09-18 ENCOUNTER — Encounter: Payer: Self-pay | Admitting: Vascular Surgery

## 2016-09-18 DIAGNOSIS — I639 Cerebral infarction, unspecified: Secondary | ICD-10-CM

## 2016-09-18 NOTE — Progress Notes (Signed)
Remote pacemaker transmission.   

## 2016-09-19 ENCOUNTER — Encounter: Payer: Self-pay | Admitting: Cardiology

## 2016-09-24 ENCOUNTER — Ambulatory Visit (INDEPENDENT_AMBULATORY_CARE_PROVIDER_SITE_OTHER): Payer: Medicare Other | Admitting: Cardiology

## 2016-09-24 DIAGNOSIS — Z7901 Long term (current) use of anticoagulants: Secondary | ICD-10-CM

## 2016-09-24 DIAGNOSIS — I480 Paroxysmal atrial fibrillation: Secondary | ICD-10-CM

## 2016-09-24 DIAGNOSIS — I639 Cerebral infarction, unspecified: Secondary | ICD-10-CM

## 2016-09-24 LAB — POCT INR: INR: 4.7

## 2016-09-25 ENCOUNTER — Encounter (HOSPITAL_COMMUNITY): Payer: Medicare Other

## 2016-09-25 ENCOUNTER — Ambulatory Visit: Payer: Medicare Other | Admitting: Vascular Surgery

## 2016-09-27 ENCOUNTER — Encounter (HOSPITAL_COMMUNITY): Payer: Self-pay | Admitting: Emergency Medicine

## 2016-09-27 ENCOUNTER — Emergency Department (HOSPITAL_COMMUNITY)

## 2016-09-27 ENCOUNTER — Inpatient Hospital Stay (HOSPITAL_COMMUNITY)
Admission: EM | Admit: 2016-09-27 | Discharge: 2016-09-29 | DRG: 291 | Disposition: A | Discharge status: Hospice | Attending: Family Medicine | Admitting: Family Medicine

## 2016-09-27 DIAGNOSIS — Z7901 Long term (current) use of anticoagulants: Secondary | ICD-10-CM | POA: Diagnosis not present

## 2016-09-27 DIAGNOSIS — N19 Unspecified kidney failure: Secondary | ICD-10-CM | POA: Diagnosis not present

## 2016-09-27 DIAGNOSIS — I1310 Hypertensive heart and chronic kidney disease without heart failure, with stage 1 through stage 4 chronic kidney disease, or unspecified chronic kidney disease: Secondary | ICD-10-CM | POA: Diagnosis present

## 2016-09-27 DIAGNOSIS — N186 End stage renal disease: Secondary | ICD-10-CM | POA: Diagnosis present

## 2016-09-27 DIAGNOSIS — Z8673 Personal history of transient ischemic attack (TIA), and cerebral infarction without residual deficits: Secondary | ICD-10-CM | POA: Diagnosis not present

## 2016-09-27 DIAGNOSIS — Z950 Presence of cardiac pacemaker: Secondary | ICD-10-CM | POA: Diagnosis not present

## 2016-09-27 DIAGNOSIS — I2510 Atherosclerotic heart disease of native coronary artery without angina pectoris: Secondary | ICD-10-CM | POA: Diagnosis present

## 2016-09-27 DIAGNOSIS — E1122 Type 2 diabetes mellitus with diabetic chronic kidney disease: Secondary | ICD-10-CM | POA: Diagnosis present

## 2016-09-27 DIAGNOSIS — Z8249 Family history of ischemic heart disease and other diseases of the circulatory system: Secondary | ICD-10-CM

## 2016-09-27 DIAGNOSIS — Z9861 Coronary angioplasty status: Secondary | ICD-10-CM | POA: Diagnosis not present

## 2016-09-27 DIAGNOSIS — Z833 Family history of diabetes mellitus: Secondary | ICD-10-CM

## 2016-09-27 DIAGNOSIS — I480 Paroxysmal atrial fibrillation: Secondary | ICD-10-CM | POA: Diagnosis present

## 2016-09-27 DIAGNOSIS — J4520 Mild intermittent asthma, uncomplicated: Secondary | ICD-10-CM | POA: Diagnosis present

## 2016-09-27 DIAGNOSIS — I132 Hypertensive heart and chronic kidney disease with heart failure and with stage 5 chronic kidney disease, or end stage renal disease: Secondary | ICD-10-CM | POA: Diagnosis present

## 2016-09-27 DIAGNOSIS — E1151 Type 2 diabetes mellitus with diabetic peripheral angiopathy without gangrene: Secondary | ICD-10-CM | POA: Diagnosis present

## 2016-09-27 DIAGNOSIS — E785 Hyperlipidemia, unspecified: Secondary | ICD-10-CM | POA: Diagnosis present

## 2016-09-27 DIAGNOSIS — N185 Chronic kidney disease, stage 5: Secondary | ICD-10-CM

## 2016-09-27 DIAGNOSIS — E1121 Type 2 diabetes mellitus with diabetic nephropathy: Secondary | ICD-10-CM

## 2016-09-27 DIAGNOSIS — Z7984 Long term (current) use of oral hypoglycemic drugs: Secondary | ICD-10-CM | POA: Diagnosis not present

## 2016-09-27 DIAGNOSIS — Z515 Encounter for palliative care: Secondary | ICD-10-CM | POA: Diagnosis present

## 2016-09-27 DIAGNOSIS — Z66 Do not resuscitate: Secondary | ICD-10-CM | POA: Diagnosis present

## 2016-09-27 DIAGNOSIS — I429 Cardiomyopathy, unspecified: Secondary | ICD-10-CM | POA: Diagnosis present

## 2016-09-27 DIAGNOSIS — I442 Atrioventricular block, complete: Secondary | ICD-10-CM

## 2016-09-27 DIAGNOSIS — D631 Anemia in chronic kidney disease: Secondary | ICD-10-CM

## 2016-09-27 DIAGNOSIS — N039 Chronic nephritic syndrome with unspecified morphologic changes: Secondary | ICD-10-CM | POA: Diagnosis not present

## 2016-09-27 DIAGNOSIS — R0602 Shortness of breath: Secondary | ICD-10-CM | POA: Diagnosis not present

## 2016-09-27 DIAGNOSIS — I5043 Acute on chronic combined systolic (congestive) and diastolic (congestive) heart failure: Secondary | ICD-10-CM | POA: Diagnosis not present

## 2016-09-27 DIAGNOSIS — I110 Hypertensive heart disease with heart failure: Secondary | ICD-10-CM

## 2016-09-27 DIAGNOSIS — I119 Hypertensive heart disease without heart failure: Secondary | ICD-10-CM | POA: Diagnosis present

## 2016-09-27 DIAGNOSIS — E1129 Type 2 diabetes mellitus with other diabetic kidney complication: Secondary | ICD-10-CM | POA: Diagnosis present

## 2016-09-27 DIAGNOSIS — I252 Old myocardial infarction: Secondary | ICD-10-CM | POA: Diagnosis not present

## 2016-09-27 MED ORDER — GLYCOPYRROLATE 0.2 MG/ML IJ SOLN
0.1000 mg | Freq: Once | INTRAMUSCULAR | Status: AC
  Administered 2016-09-27: 0.1 mg via INTRAVENOUS
  Filled 2016-09-27: qty 1

## 2016-09-27 MED ORDER — MORPHINE SULFATE (PF) 4 MG/ML IV SOLN: 4.0000 mg | Freq: Once | INTRAVENOUS | Status: DC

## 2016-09-27 MED ORDER — LORAZEPAM 1 MG PO TABS: 1.0000 mg | ORAL_TABLET | ORAL | Status: DC | PRN

## 2016-09-27 MED ORDER — MORPHINE SULFATE (PF) 2 MG/ML IV SOLN
1.0000 mg | INTRAVENOUS | Status: DC | PRN
  Administered 2016-09-28: 1 mg via INTRAVENOUS
  Filled 2016-09-27: qty 1

## 2016-09-27 MED ORDER — LORAZEPAM 2 MG/ML PO CONC: 1.0000 mg | ORAL | Status: DC | PRN

## 2016-09-27 MED ORDER — MORPHINE SULFATE (PF) 2 MG/ML IV SOLN
INTRAVENOUS | Status: AC
  Filled 2016-09-27: qty 1

## 2016-09-27 MED ORDER — LORAZEPAM 2 MG/ML IJ SOLN
INTRAMUSCULAR | Status: AC
  Filled 2016-09-27: qty 1

## 2016-09-27 MED ORDER — LORAZEPAM 2 MG/ML IJ SOLN
1.0000 mg | INTRAMUSCULAR | Status: DC | PRN
  Administered 2016-09-28: 1 mg via INTRAVENOUS

## 2016-09-27 MED ORDER — MORPHINE SULFATE (PF) 2 MG/ML IV SOLN
2.0000 mg | Freq: Once | INTRAVENOUS | Status: AC
  Administered 2016-09-27: 2 mg via INTRAVENOUS
  Filled 2016-09-27: qty 1

## 2016-09-27 NOTE — ED Triage Notes (Signed)
Pt arrives with SHOB/resp distress worsening over the last couple of days, hospice patient x1 month. Per EMS, pt reports increased weakness - unable to transfer from bed to chair. Hospice nurse at home with patient when EMS arrived, family insisted on transport to ED. EMS attempted to place on CPAP, pt unable to tolerate. With EMS pt decreased BP and RR, EMS had to bag with BVM. Pt tolerating nasal cannula upon arrival to ED.  Renette ButtersGolden ticket at bedside.

## 2016-09-27 NOTE — H&P (Signed)
History and Physical  Patient Name: Dorothy Bartlett Dorothy Bartlett     ZOX:[id number]    DOB: [birth year]    DOA: 09/27/2016 PCP: Dorothy NineLEAGUE-SOBON, JENNIFER, MD  Nephrology: Dr. Hyman HopesWebb    Patient coming from: Home with hospice  Chief Complaint: Shortness of breath  HPI: Dorothy Bartlett is a 82 y.o. female with a past medical history significant for multivessel CAD, chronic combined systolic and diastolic CHF EF 81%45%, CHP with pacemaker, stage V CKD not dialysis candidate, A. fib on warfarin, and CVA who presents with worsening fluid overload.  The patient was admitted for a week at the end of August for kidney failure, cardiorenal syndrome, and end-stage CHF. During that hospitalization she had no response to IV Lasix, and was placed on torsemide, to which she had some urine output, but little clinical improvement, and at the time of discharge was discharged to hospice care.  Over the last 2 weeks, hospice has been engaged, and the patient has been back at home, somewhat interactive, but with dyspnea with any movement, and unable to breathe in any position except upright.  She had her diuretics adjusted for ease of swallowing.  Now in the last few days, her daughter has noticed grunting with breathing at timesand her urine output has been decreased.  Today, she seemed to be in more respiratory distress than before, and so she gave her mother SL morphine, which helped, but only for <1hr.  The Hospice RN came out and the decision was made to transfer the patient to the ER for more aggressive palliative care than available at home.  ED course: -Afebrile, heart rate 60s, respirations 15, blood pressure 110/65, pulse oximetry normal on supplemental oxygen by nasal cannula -A radiograph showed pulmonary edema and pleural effusions similar to previous hospitalization and chemistries were deferred by family who stated the patient's preferences were for no aggressive measures but comfort directed  treatments        ROS: Review of Systems  Unable to perform ROS: Severe respiratory distress          Past Medical History:  Diagnosis Date  . 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 Carotid stenosis    Carotid US (12/2013): < 60% RCA, < 40% LICA.  [name]. CHF (congestive heart failure) (HCC)   . Chronic combined systolic and diastolic heart failure (HCC) 04/13/2014  . Chronic kidney disease (CKD), stage V (HCC)    notes 07/03/2015; Dr. Hyman HopesWebb  . Complete heart block (HCC)    a. 05/2015 [facility]p [facility] [employer] [facility] model [id number] dual chamber PPM (serial number [id number]) (Dr. Johney FrameAllred).  . Coronary artery disease    a. s/p MI 1995 tx w/ PTCA to Dx, Ramus, ap LAD;  b. LHC (11/2004):  Inferior HK, EF 60%, proximal LAD 50%, ostial D1 80%, mid D1 95%, mid LAD 50-60%, distal LAD at the apex 90%, OM1 occluded (fills late by left to left collaterals), RCA 75%. Medical therapy recommended.;  c. Lexiscan Myoview (12/2013):  Prior ant-lat scar with very small area of peri-infarct ischemia in ant wall, EF 40 (high risk)   . Hx of echocardiogram    a. Echo 5/16: dist septal, dist ant, dist inf and apical AK, EF 40-45%, mod MR, mild LAE, PASP 72  . Hyperlipidemia   . Hypertension   . Intrinsic asthma 01/28/2008    Mild intermittant asthma, PFT's 01/28/08     . Obesity (BMI 30-39.9)   . Old anterolateral myocardial infarction   .  Presence of permanent cardiac pacemaker   . PVD (peripheral vascular disease) (HCC) 02/16/2014   2015, right femoral peroneal bypass grafting and left femoral-tibial bypass grafting by Dr. Arbie Cookey for rest pain as well as nonhealing ulcer on toe accompanied with toe amputation  Arteriogram October 2015 with patent graft on the right, 70% stenosis of graft on the left proximally.   . Stroke (HCC) 06/29/2016   speech and memory deficits, some R sided weakness  . Type 2 diabetes mellitus with renal manifestations [facility])     Past Surgical  History:  Procedure Laterality Date  . ABDOMINAL ANGIOGRAM  01/08/2014   Procedure: ABDOMINAL ANGIOGRAM;  Surgeon: Sherren Kerns, MD;  Location[facility] C[facility] LAB;  Service: Cardiovascular;;  . ABDOMINAL AORTAGRAM N/A 10/25/2014   Procedure: ABDOMINAL Ronny Flurry;  Surgeon: Chuck Hint, MD;  Location: [facility] CATH LAB;  Service: Cardiovascular;  Laterality: N/A;  . AMPUTATION Right 08/04/2014   Procedure: AMPUTATION DIGIT-RIGHT 3RD TOE;  Surgeon: Larina Earthly, MD;  Location: [facility] OR;  Service: Vascular;  Laterality: Right;  . CARDIAC CATHETERIZATION  1995   w/ PTCA Diag 2nd MI  . EP IMPLANTABLE DEVICE N/A 05/10/2015   Procedure: Pacemaker Implant;  Surgeon: Hillis Range, MD;  Location: [facility] INVASIVE CV LAB;  Service: Cardiovascular;  Laterality: N/A;  . EP IMPLANTABLE DEVICE N/A 06/13/2015   Procedure: Lead Revision/Repair;  Surgeon: Duke Salvia, MD;  Location: [facility] INVASIVE CV LAB;  Service: Cardiovascular;  Laterality: N/A;  . FEMORAL-POPLITEAL BYPASS GRAFT Right 08/04/2014   Procedure: RIGHT FEMORAL-PERONEAL ARTERY BYPASS GRAFT;  Surgeon: Larina Earthly, MD;  Location: [facility] OR;  Service: Vascular;  Laterality: Right;  . FEMORAL-TIBIAL BYPASS GRAFT Left 02/24/2014   Procedure: BYPASS GRAFT FEMORAL-TIBIAL ARTERY;  Surgeon: Larina Earthly, MD;  Location: [facility] OR;  Service: Vascular;  Laterality: Left;  . INSERT / REPLACE / REMOVE PACEMAKER    . LOWER EXTREMITY ANGIOGRAM Left 01/08/2014   Procedure: LOWER EXTREMITY ANGIOGRAM;  Surgeon: Sherren Kerns, MD;  Location: [facility] CATH LAB;  Service: Cardiovascular;  Laterality: Left;  . LOWER EXTREMITY ANGIOGRAM Right 08/03/2014   Procedure: LOWER EXTREMITY ANGIOGRAM;  Surgeon: Nada Libman, MD;  Location: [facility] CATH LAB;  Service: Cardiovascular;  Laterality: Right;  . PACEMAKER LEAD REMOVAL  06/13/2015   Procedure: Pacemaker Lead Removal;  Surgeon: Duke Salvia, MD;  Location: [facility] INVASIVE CV LAB;  Service: Cardiovascular;;  . repair of nerve in left arm  Left 2000     Social History: Patient lives at home with her daughter.  The patient no longer walks. She is from [place].  She is not a smoker.    No Known Allergies  Family history: family history includes Diabetes in her father; Hyperlipidemia in her daughter and son; Hypertension in her daughter, father, and sister.  Prior to Admission medications   Medication Sig Start Date End Date Taking? Authorizing Provider  albuterol (PROVENTIL HFA;VENTOLIN HFA) 108 (90 BASE) MCG/ACT inhaler Inhale 2 puffs into the lungs every 6 (six) hours as needed for wheezing or shortness of breath.   Yes Historical Provider, MD  B Complex-C-Folic Acid (RENA-VITE RX) 1 MG TABS Take 1 tablet by mouth every morning.  02/05/14  Yes Historical Provider, MD  carvedilol (COREG) 12.5 MG tablet Take 1 tablet (12.5 mg total) by mouth 2 (two) times daily. 02/04/14  Yes Lyn Records, MD  docusate sodium (COLACE) 100 MG capsule Take 100 mg by mouth every evening.    Yes Historical Provider, MD  guaiFENesin (ROBITUSSIN) 100 MG/5ML SOLN Take 10 mLs by mouth every 4 (four) hours as needed for cough or to loosen phlegm.   Yes Historical Provider, MD  hydrALAZINE (APRESOLINE) 10 MG tablet Take 1 tablet (10 mg total) by mouth 3 (three) times daily. 09/21/15  Yes Duke Salvia, MD  isosorbide mononitrate (IMDUR) 30 MG 24 hr tablet Take 1 tablet (30 mg total) by mouth daily. 07/30/16  Yes Lyn Records, MD  JANUVIA 25 MG tablet Take 25 mg by mouth daily. 06/29/15  Yes Historical Provider, MD  loratadine (CLARITIN) 10 MG tablet Take 10 mg by mouth daily as needed for allergies.   Yes Historical Provider, MD  Melatonin 3 MG TABS Take 3 mg by mouth at bedtime as needed (for sleep).    Yes Historical Provider, MD  metolazone (ZAROXOLYN) 2.5 MG tablet Take 2.5 mg by mouth daily. Take 30 mins before all other am meds   Yes Historical Provider, MD  nitroGLYCERIN (NITROSTAT) 0.4 MG SL tablet Place 1 tablet (0.4 mg total) under the tongue every 5 (five)  minutes x 3 doses as needed for chest pain. 06/17/15  Yes Lyn Records, MD  paricalcitol (ZEMPLAR) 1 MCG capsule Take 1 mcg by mouth daily.  04/07/14  Yes Historical Provider, MD  potassium chloride SA (K-DUR,KLOR-CON) 20 MEQ tablet Take 1 tablet (20 mEq total) by mouth once. Patient taking differently: Take 20 mEq by mouth daily.  07/04/16  Yes Catarina Hartshorn, MD  torsemide (DEMADEX) 20 MG tablet Take 4 tablets (80 mg total) by mouth 2 (two) times daily. Patient taking differently: Take 100 mg by mouth 2 (two) times daily.  08/23/16  Yes Vassie Loll, MD  Vitamin D, Ergocalciferol, (DRISDOL) 50000 UNITS CAPS capsule Take 1 capsule (50,000 Units total) by mouth every 7 (seven) days. 06/15/15  Yes Harold Barban, MD  warfarin (COUMADIN) 6 MG tablet Take as directed by [facility] Patient taking differently: Take 6 mg by mouth daily.  09/04/16  Yes Lyn Records, MD       Physical Exam: BP 104/68   Pulse 60   Temp 98.8 F (37.1 C) (Oral)   Resp 13   SpO2 100%  General appearance: Elderly chronically illappearing adult female, somnolent, rousable but unable to make intelligible answers.   Eyes: Anicteric, conjunctiva pink, lids and lashes normal.    ENT: No nasal deformity, discharge, epistaxis.  Hearing appears normal. OP dry.   Skin: Warm and dry.  No suspicious rashes or lesions. Cardiac: RRR, loud S2, heart sounds dsitant, no murmurs appreciated.  JVD. Pitting edemea to mid shin.  Radial pulses 2+ and symmetric. Respiratory: Tachypnea, rales bilaterally. Abdomen: No ascites, distension, hepatosplenomegaly.   MSK: No deformities or effusions.  No cyanosis or clubbing. Neuro: Responds to voice, does not follow commands.  Muscle strength globally weak.    Psych: Unable to assess.     Labs on Admission:  I have personally reviewed following labs and imaging studies: CBC: No results for input(s): WBC, NEUTROABS, HGB, HCT, MCV, PLT in the last 168 hours. Basic Metabolic Panel: No results for  input(s): NA, K, CL, CO2, GLUCOSE, BUN, CREATININE, CALCIUM, MG, PHOS in the last 168 hours. GFR: CrCl cannot be calculated (Unknown ideal weight.).  Liver Function Tests: No results for input(s): AST, ALT, ALKPHOS, BILITOT, PROT, ALBUMIN in the last 168 hours. No results for input(s): LIPASE, AMYLASE in the last 168 hours. No results for input(s): AMMONIA in the last 168 hours. Coagulation Profile:  Recent Labs Lab 09/24/16  INR 4.7   Cardiac Enzymes: No results for input(s): CKTOTAL, CKMB, CKMBINDEX, TROPONINI in the last 168 hours. BNP (last 3 results) No results for input(s): PROBNP in the last 8760 hours. HbA1C: No results for input(s): HGBA1C in the last 72 hours. CBG: No results for input(s): GLUCAP in the last 168 hours. Lipid Profile: No results for input(s): CHOL, HDL, LDLCALC, TRIG, CHOLHDL, LDLDIRECT in the last 72 hours. Thyroid Function Tests: No results for input(s): TSH, T4TOTAL, FREET4, T3FREE, THYROIDAB in the last 72 hours. Anemia Panel: No results for input(s): VITAMINB12, FOLATE, FERRITIN, TIBC, IRON, RETICCTPCT in the last 72 hours. Sepsis Labs: Invalid input(s): PROCALCITONIN, LACTICIDVEN No results found for this or any previous visit (from the past 240 hour(s)).       Radiological Exams on Admission: Personally reviewed: Dg Chest 2 View  Result Date: 09/27/2016 CLINICAL DATA:  Acute onset of shortness of breath. Initial encounter. EXAM: CHEST  2 VIEW COMPARISON:  Chest radiograph performed 08/17/2016 FINDINGS: Small to moderate right and small left pleural effusions are seen. Bibasilar airspace opacities raise concern for mild pulmonary edema. No pneumothorax is seen. The cardiomediastinal silhouette is enlarged. A pacemaker is noted at the left chest wall, with leads ending at the right atrium and right ventricle. No acute osseous abnormalities are seen. IMPRESSION: Small to moderate right and small left pleural effusions noted. Bibasilar airspace  opacities raise concern for mild pulmonary edema. Cardiomegaly noted. Electronically Signed   By: Roanna Raider M.D.   On: 09/27/2016 23:15        Assessment/Plan 1. Cardiorenal syndrome with renal failure in end stage CHF and ESRD:  Aggressive attempts at diuresis were made during the patient's last hospitalization 3 weeks ago, these appeared to have failed in the face of her progressive heart and kidney failure.    Focus has shifted in the last three weeks to palliation of symptoms, which appear to have progressed as expected.  Goals of care at this time are palliation of respiratory distress, anxiety and agitation. -Morphine 1 mg IV q1hr PRN -Lorazepam PRN for anxiety -Duoneb as needed for wheezing  -I will continue torsemide and metolazone for now -I will continue hydralazine as well for now -Consult to Palliative Care         DVT prophylaxis: N/A, comfort cares  Code Status: DO NOT RESUSCITATE  Family Communication: Daughter Landis Gandy at the bedside. Disposition Plan: Anticipate that the patient is in her last hours or days of life, and this is the daughter's understanding as well.  Focus is on finding the right setting for the patient's palliative care at this time. Consults called: None overnight Admission status: INPATIENT, med surg        Medical decision making: Patient seen at 11:20 PM on 09/27/2016.  The patient was discussed with Dr. Sibyl Parr.  What exists of the patient's chart was reviewed in depth and summarized above.  Clinical condition: at end of life, comfort cares only.        Alberteen Sam [facility] Pager [phone]

## 2016-09-27 NOTE — ED Provider Notes (Signed)
[facility] DEPT Provider Note  CSN: [id number] Arrival Date & Time: 09/27/16 @ 2159  History    Chief Complaint Chief Complaint  Patient presents with  . Shortness of Breath    HPI Dorothy Bartlett is a 82 y.o. female. Presents for worsening SOB over the last day. In hospice for cardiorenal syndrome that is no survivable. Through extensive conversation with daughter who is most involved in mothers life she would not want CPR or intubation. Patient arrives on NRBM due to SOB and discomfort and endorsement of "she just keeps looking like she's uncomfortable" per the daughter, "hospice doesn't known when she will pass, they said she's about at 30%". Daughter states mom would have not wanted other interventions and requests to keep her comfortable and w/o pain so they can bring her home tomorrow. Patient not communicative and unable to provide HPI or ROS.  Past Medical & Surgical History    Past Medical History:  Diagnosis Date  . 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 Carotid stenosis    Carotid US (12/2013): < 60% RCA, < 40% LICA.  [name] CHF (congestive heart failure) (HCC)   . Chronic combined systolic and diastolic heart failure (HCC) 04/13/2014  . Chronic kidney disease (CKD), stage V (HCC)    notes 07/03/2015; Dr. Hyman Hopes  . Complete heart block (HCC)    a. 05/2015 s/p [facility] Assurity DR model [phone] dual chamber PPM (serial number [id number]) (Dr. Johney Frame).  . Coronary artery disease    a. s/p MI 1995 tx w/ PTCA to Dx, Ramus, ap LAD;  b. LHC (11/2004):  Inferior HK, EF 60%, proximal LAD 50%, ostial D1 80%, mid D1 95%, mid LAD 50-60%, distal LAD at the apex 90%, OM1 occluded (fills late by left to left collaterals), RCA 75%. Medical therapy recommended.;  c. Lexiscan Myoview (12/2013):  Prior ant-lat scar with very small area of peri-infarct ischemia in ant wall, EF 40 (high risk)   . Hx of echocardiogram    a. Echo 5/16: dist septal, dist ant,  dist inf and apical AK, EF 40-45%, mod MR, mild LAE, PASP 72  . Hyperlipidemia   . Hypertension   . Intrinsic asthma 01/28/2008    Mild intermittant asthma, PFT's 01/28/08     . Obesity (BMI 30-39.9)   . Old anterolateral myocardial infarction   . Presence of permanent cardiac pacemaker   . PVD (peripheral vascular disease) (HCC) 02/16/2014   2015, right femoral peroneal bypass grafting and left femoral-tibial bypass grafting by Dr. Arbie Cookey for rest pain as well as nonhealing ulcer on toe accompanied with toe amputation  Arteriogram October 2015 with patent graft on the right, 70% stenosis of graft on the left proximally.   . Stroke (HCC) 06/29/2016   speech and memory deficits, some R sided weakness  . Type 2 diabetes mellitus with renal manifestations [employer])    Patient Active Problem List   Diagnosis Date Noted  . CHF (congestive heart failure) (HCC) 08/17/2016  . Long term (current) use of anticoagulants [Z79.01] 07/24/2016  . Dysphagia, post-stroke   . Stroke (HCC)   . HLD (hyperlipidemia)   . Paroxysmal atrial fibrillation (HCC)   . Acute ischemic stroke (HCC)   . Acute respiratory failure with hypoxia (HCC)   . Chronic kidney disease, stage V (HCC)   . Acute CVA (cerebrovascular accident) (HCC) 06/29/2016  . AKI (acute kidney injury) (HCC) 06/29/2016  . Acute on chronic combined systolic and diastolic heart failure (  HCC)   . Essential hypertension   . Vitamin D deficiency 06/14/2015  . Secondary hyperparathyroidism (HCC) 06/14/2015  . Dyspnea   . Pacemaker lead malfunction 06/11/2015  . Cardiomyopathy-EF 40-45% by echo May 2016 06/11/2015  . Palliative care encounter   . Onychomycosis 06/03/2015  . Pain in lower limb 06/03/2015  . Pulmonary HTN (HCC) 05/15/2015  . Pacemaker-St Jude May 2016   . Elevated troponin 05/09/2015  . Pre-syncope 05/09/2015  . 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 Complete heart block (HCC) 05/08/2015  .  Obesity (BMI 30-39.9)   . Old anterolateral myocardial infarction   . Type 2 diabetes mellitus with renal manifestations (HCC)   . Chronic combined systolic and diastolic heart failure (HCC) 04/13/2014  . PVD (peripheral vascular disease) (HCC) 02/16/2014  . CKD stage 5, GFR less than 15 ml/min 01/15/2014  . Intrinsic asthma 01/28/2008  . Hypertensive heart disease   . Hyperlipidemia   . CAD S/P PTCA 1995    Past Surgical History:  Procedure Laterality Date  . ABDOMINAL ANGIOGRAM  01/08/2014   Procedure: ABDOMINAL ANGIOGRAM;  Surgeon: Sherren Kerns, MD;  Location: [facility] CATH LAB;  Service: Cardiovascular;;  . ABDOMINAL AORTAGRAM N/A 10/25/2014   Procedure: ABDOMINAL Ronny Flurry;  Surgeon: Chuck Hint, MD;  Location: [facility] CATH LAB;  Service: Cardiovascular;  Laterality: N/A;  . AMPUTATION Right 08/04/2014   Procedure: AMPUTATION DIGIT-RIGHT 3RD TOE;  Surgeon: Larina Earthly, MD;  Location: [facility] OR;  Service: Vascular;  Laterality: Right;  . CARDIAC CATHETERIZATION  1995   w/ PTCA Diag 2nd MI  . EP IMPLANTABLE DEVICE N/A 05/10/2015   Procedure: Pacemaker Implant;  Surgeon: Hillis Range, MD;  [facility] INV[facility] CV LAB;  Service: Cardiovascular;  Laterality: N/A;  . EP IMPLANTABLE DEVICE N/A 06/13/2015   Procedure: Lead Revision/Repair;  Surgeon: Duke Salvia, MD;  Location: [facility] INVASIVE CV LAB;  Service: Cardiovascular;  Laterality: N/A;  . FEMORAL-POPLITEAL BYPASS GRAFT Right 08/04/2014   Procedure: RIGHT FEMORAL-PERONEAL ARTERY BYPASS GRAFT;  Surgeon: Larina Earthly, MD;  Location: [facility] OR;  Service: Vascular;  Laterality: Right;  . FEMORAL-TIBIAL BYPASS GRAFT Left 02/24/2014   Procedure: BYPASS GRAFT FEMORAL-TIBIAL ARTERY;  Surgeon: Larina Earthly, MD;  Location[facility] OR;  Service: Vascular;  Laterality: Left;  . INSERT / REPLACE / REMOVE PACEMAKER    . LOWER EXTREMITY ANGIOGRAM Left 01/08/2014   Procedure: LOWER EXTREMITY ANGIOGRAM;  Surgeon: Sherren Kerns, MD;  Location: [facility] CATH LAB;  Service:  Cardiovascular;  Laterality: Left;  . LOWER EXTREMITY ANGIOGRAM Right 08/03/2014   Procedure: LOWER EXTREMITY ANGIOGRAM;  Surgeon: Nada Libman, MD;  Location: [facility] CATH LAB;  Service: Cardiovascular;  Laterality: Right;  . PACEMAKER LEAD REMOVAL  06/13/2015   Procedure: Pacemaker Lead Removal;  Surgeon: Duke Salvia, MD;  Location: [facility] INVASIVE CV LAB;  Service: Cardiovascular;;  . repair of nerve in left arm  Left 2000    Family & Social History    Family History  Problem Relation Age of Onset  . Diabetes Father   . Hypertension Father   . Hypertension Daughter   . Hyperlipidemia Daughter   . Hyperlipidemia Son   . Hypertension Sister    Social History  Substance Use Topics  . Smoking status: Never Smoker  . Smokeless tobacco: Never Used  . Alcohol use No    Home Medications    Prior to Admission medications   Medication Sig Start Date End Date Taking?  Authorizing Provider  albuterol (PROVENTIL HFA;VENTOLIN HFA) 108 (90 BASE) MCG/ACT inhaler Inhale 2 puffs into the lungs every 6 (six) hours as needed for wheezing or shortness of breath.    Historical Provider, MD  B Complex-C-Folic Acid (RENA-VITE RX) 1 MG TABS Take 1 tablet by mouth every morning.  02/05/14   Historical Provider, MD  carvedilol (COREG) 12.5 MG tablet Take 1 tablet (12.5 mg total) by mouth 2 (two) times daily. 02/04/14   Lyn RecordsHenry W Smith, MD  Darbepoetin Alfa-Albumin (ARANESP IJ) Inject as directed every 14 (fourteen) days.    Historical Provider, MD  docusate sodium (COLACE) 100 MG capsule Take 100 mg by mouth 2 (two) times daily.     Historical Provider, MD  guaiFENesin (ROBITUSSIN) 100 MG/5ML SOLN Take 10 mLs by mouth every 4 (four) hours as needed for cough or to loosen phlegm.    Historical Provider, MD  hydrALAZINE (APRESOLINE) 10 MG tablet Take 1 tablet (10 mg total) by mouth 3 (three) times daily. 09/21/15   Duke SalviaSteven C Klein, MD  isosorbide mononitrate (IMDUR) 30 MG 24 hr tablet Take 1 tablet (30 mg total) by  mouth daily. 07/30/16   Lyn RecordsHenry W Smith, MD  JANUVIA 25 MG tablet Take 25 mg by mouth daily. 06/29/15   Historical Provider, MD  loratadine (CLARITIN) 10 MG tablet Take 10 mg by mouth daily as needed for allergies.    Historical Provider, MD  Melatonin 3 MG TABS Take 3 mg by mouth at bedtime as needed (for sleep).     Historical Provider, MD  nitroGLYCERIN (NITROSTAT) 0.4 MG SL tablet Place 1 tablet (0.4 mg total) under the tongue every 5 (five) minutes x 3 doses as needed for chest pain. 06/17/15   Lyn RecordsHenry W Smith, MD  paricalcitol (ZEMPLAR) 1 MCG capsule Take 1 mcg by mouth daily.  04/07/14   Historical Provider, MD  potassium chloride SA (K-DUR,KLOR-CON) 20 MEQ tablet Take 1 tablet (20 mEq total) by mouth once. Patient taking differently: Take 20 mEq by mouth daily.  07/04/16   Catarina Hartshornavid Tat, MD  torsemide (DEMADEX) 20 MG tablet Take 4 tablets (80 mg total) by mouth 2 (two) times daily. 08/23/16   Vassie Lollarlos Madera, MD  Vitamin D, Ergocalciferol, (DRISDOL) 50000 UNITS CAPS capsule Take 1 capsule (50,000 Units total) by mouth every 7 (seven) days. 06/15/15   Harold BarbanLawrence Ngo, MD  warfarin (COUMADIN) 6 MG tablet Take as directed by [facility] 09/04/16   Lyn RecordsHenry W Smith, MD    Allergies    Review of patient's allergies indicates no known allergies.  I reviewed & agree with nursing's documentation on the patient's past medical, surgical, social & family histories as well as their allergies.  Review of Systems  Complete ROS obtained, and is negative except as stated in HPI.   Physical Exam  Updated Vital Signs BP 109/67 (BP Location: Left Arm)   Pulse 63   Temp 98.8 F (37.1 C) (Oral)   Resp 15   SpO2 98%  I have reviewed the triage vital signs and the nursing notes. Physical Exam CONST: Patient ill-appearing, appears depressed, gasping for air.  EYES: PERRLA. EOM unable to be assessed. Conjunctiva w/o d/c. Lids AT w/o swelling.  ENMT: External Nares & Ears AT w/o swelling. Oropharynx w/ thick secretions.  MM dry.  NECK: ROM full w/o rigidity. Trachea midline. JVD present.  CVS: +S1/S2 w/ systolic murmur and S3. Lower extremities w/o pitting edema.  RESP: Respiratory effort labored w/ retractions or accessory muscle use. BS  reduced and coarse.  GI: Soft & ND. +BS x 4. TTP absent. Hernia absent. Guarding & Rebound absent.  SKIN: Skin clammy. Turgor poor. No rash.  NEURO: CN unable to be assessed. Motor & Sensory exam unable to be assessed.  MSK: Joints located & stable, w/o obvious dislocation & obvious deformity or crepitus absent. Peripheral pulses symmetric and faint in all extremities.   ED Treatments & Results   Labs (only abnormal results are displayed) Labs Reviewed  CBC WITH DIFFERENTIAL/PLATELET  BASIC METABOLIC PANEL    EKG    EKG Interpretation  Date/Time:    Ventricular Rate:    PR Interval:    QRS Duration:   QT Interval:    QTC Calculation:   R Axis:     Text Interpretation:         Radiology No results found.  Pertinent labs & imaging results that were available during my care of the patient were personally visualized by me and considered in my medical decision making, please see chart for details.  Procedures (including critical care time) Procedures  Medications Ordered in ED Medications - No data to display  Initial Impression & Assessment and Plan & ED Course   Patient presents SOB and is currently in hospice for Cardiac and Renal Failure. DNR in place. Code status w/ DNI confirmed again w/ daughter. Providing symptomatic comfort measures and no labs per daughter. CXR ordered by nursing, and per my review consistent w/ heart failure.  Final Disposition: Admit to Hospitalist for Hospice consultation and symptomatic treatment and d/c to Hospice tomorrow AM. No workup to be pursued.  Final Clinical Impressions(s) & ED Diagnoses  No diagnosis found.  Patient care discussed with the attending physician, who oversaw their evaluation & treatment &  voiced agreement. Note: This document was prepared using Dragon voice recognition software and may include unintentional dictation errors.  House Officer: Jonette Eva, MD, Emergency Medicine Resident.   Jonette Eva, MD 10/01/16 1610    Jacalyn Lefevre, MD 10-09-16 [phone]

## 2016-09-28 ENCOUNTER — Encounter (HOSPITAL_COMMUNITY): Payer: Self-pay | Admitting: *Deleted

## 2016-09-28 MED ORDER — METOLAZONE 2.5 MG PO TABS
2.5000 mg | ORAL_TABLET | ORAL | Status: DC
  Administered 2016-09-28 – 2016-09-29 (×2): 2.5 mg via ORAL
  Filled 2016-09-28 (×3): qty 1

## 2016-09-28 MED ORDER — IPRATROPIUM-ALBUTEROL 0.5-2.5 (3) MG/3ML IN SOLN
3.0000 mL | Freq: Four times a day (QID) | RESPIRATORY_TRACT | Status: DC
  Administered 2016-09-28: 3 mL via RESPIRATORY_TRACT
  Filled 2016-09-28: qty 3

## 2016-09-28 MED ORDER — TORSEMIDE 20 MG PO TABS
100.0000 mg | ORAL_TABLET | Freq: Two times a day (BID) | ORAL | Status: DC
  Administered 2016-09-28 – 2016-09-29 (×4): 100 mg via ORAL
  Filled 2016-09-28 (×4): qty 5

## 2016-09-28 MED ORDER — MORPHINE SULFATE (CONCENTRATE) 10 MG/0.5ML PO SOLN: 5.0000 mg | ORAL | Status: DC | PRN

## 2016-09-28 MED ORDER — IPRATROPIUM-ALBUTEROL 0.5-2.5 (3) MG/3ML IN SOLN
3.0000 mL | Freq: Three times a day (TID) | RESPIRATORY_TRACT | Status: DC
  Administered 2016-09-28 – 2016-09-29 (×4): 3 mL via RESPIRATORY_TRACT
  Filled 2016-09-28 (×4): qty 3

## 2016-09-28 MED ORDER — LORAZEPAM 0.5 MG PO TABS: 0.2500 mg | ORAL_TABLET | Freq: Four times a day (QID) | ORAL | Status: DC | PRN

## 2016-09-28 MED ORDER — HYDRALAZINE HCL 10 MG PO TABS
10.0000 mg | ORAL_TABLET | Freq: Three times a day (TID) | ORAL | Status: DC
  Administered 2016-09-28 – 2016-09-29 (×5): 10 mg via ORAL
  Filled 2016-09-28 (×5): qty 1

## 2016-09-28 MED ORDER — ORAL CARE MOUTH RINSE
15.0000 mL | Freq: Two times a day (BID) | OROMUCOSAL | Status: DC
  Administered 2016-09-28 – 2016-09-29 (×4): 15 mL via OROMUCOSAL

## 2016-09-28 MED ORDER — LORAZEPAM 2 MG/ML IJ SOLN: 0.2500 mg | INTRAMUSCULAR | Status: DC | PRN

## 2016-09-28 MED ORDER — GLYCOPYRROLATE 1 MG PO TABS
2.0000 mg | ORAL_TABLET | Freq: Four times a day (QID) | ORAL | Status: DC | PRN
  Filled 2016-09-28: qty 2

## 2016-09-28 MED ORDER — MORPHINE SULFATE (PF) 2 MG/ML IV SOLN: 1.0000 mg | INTRAVENOUS | Status: DC | PRN

## 2016-09-28 NOTE — Care Management Note (Deleted)
Case Management Note  Patient Details  Name: Claretta FraiseCorine H Shone MRN: [id number] Date of Birth: 24-Nov-1926  Subjective/Objective:    Pt admitted with worsening SOB , progressive weakness, cough, non productive , worsening lower extremity edema.               Action/Plan:  Pt recently [facility].  CM contacted agency and spoke with Stacy.  Liaison confirmed that pt is active and GIP appropriate however agency will handle all care  - no need to go through formal process of discharging and readmitting with hospice.  Liaison is on the way to assess pt now and will follow back up with CM - per MD note pt is now considered comfort care.  CM will continue to follow for discharge needs    Expected Discharge Date:                  Expected Discharge Plan:     In-House Referral:     Discharge planning Services  CM Consult  Post Acute Care Choice:    Choice offered to:     DME Arranged:    DME Agency:     HH Arranged:    HH Agency:     Status of Service:  In process, will continue to follow  If discussed at Long Length of Stay Meetings, dates discussed:    Additional Comments:  Cherylann ParrClaxton, Siddarth Hsiung S, RN 09/28/2016, 10:11 AM

## 2016-09-28 NOTE — Progress Notes (Signed)
PROGRESS NOTE    Dorothy Bartlett  ZOX:[id number] DOB: 08-05-1926 DOA: 09/27/2016 PCP: Eliott Nine, MD    Brief Narrative:  82 y/o with history of Cardiorenal syndrome at home hospice who presented to the hospital due to difficulty breathing. Currently patient's breathing condition improved per daughter. At this point focus is on optimizing comfort.   Assessment & Plan:   Principal Problem:   Cardiorenal syndrome with renal failure Active Problems:   Hypertensive heart disease   CAD S/P PTCA 1995   CKD stage 5, GFR less than 15 ml/min   Type 2 diabetes mellitus with renal manifestations (HCC)   Anemia due to pre-end-stage renal disease treated with erythropoietin   Complete heart block [facility])   Pacemaker-St Jude May 2016   Cardiomyopathy-EF 40-45% by echo May 2016   Palliative care encounter   Paroxysmal atrial fibrillation (HCC)   Acute on chronic combined systolic and diastolic CHF (congestive heart failure) (HCC)  - For problems listed above plan is to continue comfort measures with ativan and morphine.   DVT prophylaxis: comfort care Code Status: DNR Family Communication: discussed with daugter Disposition Plan: awaiting decision, home vs residential hospice   Consultants:   none   Procedures: None   Antimicrobials: None   Subjective: Pt has no new complaints. No acute issues overnight.  Objective: Vitals:   09/28/16 0131 09/28/16 0924 09/28/16 0928 09/28/16 1522  BP:  (!) 128/58    Pulse:  60    Resp:  14    Temp:  97.8 F (36.6 C)    TempSrc:  Oral    SpO2: 94% 100% 99% 93%  Weight:        Intake/Output Summary (Last 24 hours) at 09/28/16 1705 Last data filed at 09/28/16 1413  Gross per 24 hour  Intake              120 ml  Output                0 ml  Net              120 ml   Filed Weights   09/28/16 0048  Weight: 76.2 kg (167 lb 15.9 oz)    Examination:  General exam: Appears calm and comfortable  Respiratory system: Clear  to auscultation. Respiratory effort normal. Cardiovascular system: S1 & S2 heard, RRR. No JVD, murmurs, rubs, gallops or clicks. No pedal edema. Gastrointestinal system: Abdomen is nondistended, soft and nontender. No organomegaly or masses felt. Normal bowel sounds heard. Central nervous system: Alert and oriented. No focal neurological deficits. Extremities: Symmetric 5 x 5 power. Skin: No rashes, lesions or ulcers Psychiatry: Judgement and insight appear normal. Mood & affect appropriate.   Data Reviewed: I have personally reviewed following labs and imaging studies  CBC: No results for input(s): WBC, NEUTROABS, HGB, HCT, MCV, PLT in the last 168 hours. Basic Metabolic Panel: No results for input(s): NA, K, CL, CO2, GLUCOSE, BUN, CREATININE, CALCIUM, MG, PHOS in the last 168 hours. GFR: CrCl cannot be calculated (Patient's most recent lab result is older than the maximum 21 days allowed.). Liver Function Tests: No results for input(s): AST, ALT, ALKPHOS, BILITOT, PROT, ALBUMIN in the last 168 hours. No results for input(s): LIPASE, AMYLASE in the last 168 hours. No results for input(s): AMMONIA in the last 168 hours. Coagulation Profile:  Recent Labs Lab 09/24/16  INR 4.7   Cardiac Enzymes: No results for input(s): CKTOTAL, CKMB, CKMBINDEX, TROPONINI in the last 168 hours.  BNP (last 3 results) No results for input(s): PROBNP in the last 8760 hours. HbA1C: No results for input(s): HGBA1C in the last 72 hours. CBG: No results for input(s): GLUCAP in the last 168 hours. Lipid Profile: No results for input(s): CHOL, HDL, LDLCALC, TRIG, CHOLHDL, LDLDIRECT in the last 72 hours. Thyroid Function Tests: No results for input(s): TSH, T4TOTAL, FREET4, T3FREE, THYROIDAB in the last 72 hours. Anemia Panel: No results for input(s): VITAMINB12, FOLATE, FERRITIN, TIBC, IRON, RETICCTPCT in the last 72 hours. Sepsis Labs: No results for input(s): PROCALCITON, LATICACIDVEN in the last 168  hours.  No results found for this or any previous visit (from the past 240 hour(s)).       Radiology Studies: Dg Chest 2 View  Result Date: 09/27/2016 CLINICAL DATA:  Acute onset of shortness of breath. Initial encounter. EXAM: CHEST  2 VIEW COMPARISON:  Chest radiograph performed 08/17/2016 FINDINGS: Small to moderate right and small left pleural effusions are seen. Bibasilar airspace opacities raise concern for mild pulmonary edema. No pneumothorax is seen. The cardiomediastinal silhouette is enlarged. A pacemaker is noted at the left chest wall, with leads ending at the right atrium and right ventricle. No acute osseous abnormalities are seen. IMPRESSION: Small to moderate right and small left pleural effusions noted. Bibasilar airspace opacities raise concern for mild pulmonary edema. Cardiomegaly noted. Electronically Signed   By: Roanna RaiderJeffery  Chang M.D.   On: 09/27/2016 23:15    Scheduled Meds: . hydrALAZINE  10 mg Oral TID  . ipratropium-albuterol  3 mL Nebulization TID  . mouth rinse  15 mL Mouth Rinse BID  . metolazone  2.5 mg Oral Q24H  . torsemide  100 mg Oral BID   Continuous Infusions:    LOS: 1 day   Time spent: > 35 minutes  [name], [name], MD [facility] Pager [phone] If 7PM-7AM, please contact night-coverage [email or web address] Password [email or web address] 09/28/2016, 5:05 PM

## 2016-09-28 NOTE — Progress Notes (Signed)
[facility] 6E-10-[facility]-GIP RN Visit  This is a GIP, covered admission from 09/28/16 related to [facility] diagnosis of CHF, per Dr. Barbee ShropshireHertweck.  Patient is a DNR.  Patient was brought to ED, due to respiratory distress, after she was treated in the home with Roxanol with minimal improvement. She was admitted for treatment of cardiorenal syndrome with renal failure in end-stage CHF.  Visited patient in room, with daughter Dorothy Bartlett at bedside.  Patient did not arouse during visit and grimaced with tactile stimuli.  She is currently on 3L [place], with respirations and O2 sats WNL.  She has received 1 mg of Ativan IV for anxiety and 1 mg of Morphine for pain, per chart review.  Staff RN completed bladder scan, which showed approximately 600 cc of urine present.  Per discussion, with PMT NP, Sarah, the plan will be to place a F/C, which will be kept in place at time of discharge for patient's comfort.  Discussed with patient's CNA to start demonstrating to daughter how to empty the drainage bag, in preparation for potential discharge home.  Spoke to daughter and [facility] SW, [place] about plans at time of discharge.  Daughter feels if the patient's symptoms continue to be managed over the next 24 hours, with the help of the PMT, then she would like to take her mother home.  She stated if she does however, decline or start have more symptoms, then she may consider transferring to [facility].  A hospital liaison with [facility] will touch base with daughter in AM to confirm plan.  Discussed DME needs and daughter is requesting a hospital bed, overbed table, T-5 mattress and humidifier for her oxygen to be delivered prior to discharge home.  Contacted [facility] regarding order and they plan to contact [place] @ [phone] to arrange time for delivery.  Patient will need PTAR arranged if she is ready for discharge in AM. Updated CMRN, Sam re: discussion with daughter. Dorothy Bartlett denied any further needs and left contact  information in any further questions arise.  Updated [facility] medication list and transfer summary on chart.  [facility] will continue to follow daily and anticipate any discharge needs.  Appreciate PMT, Sarah, for her assistance with symptom management and discharge disposition.  Please call with any hospice-related questions or concerns.  Thank you, Hessie KnowsStacie Wilkinson RN, BSN [facility] Hospital Liaison [phone]

## 2016-09-28 NOTE — Consult Note (Signed)
Consultation Note Date: 09/28/2016   Patient Name: Dorothy Bartlett  DOB: [birth year]  MRN: [id number]  Age / Sex: 82 y.o., female  PCP: Eliott Nine, MD Referring Physician: Penny Pia, MD  Reason for Consultation: Non pain symptom management and Pain control  HPI/Patient Profile: 82 y.o. female  with past medical history of Multivessel coronary artery disease, chronic combined heart failure with an ejection fraction of 45%, pacemaker, stage V chronic kidney disease (not a dialysis candidate), atrial fibrillation, CVA admitted on 09/27/2016 with volume overload and acute shortness of breath. Patient was recently admitted to hospice and palliative care of [facility] for end-of-life care secondary to the aforementioned chronic medical illnesses. She went into volume overload last night and they were unable to get her comfortable in the home and she was brought to the emergency room for symptom management. This is a covered hospice-related admission.   Clinical Assessment and Goals of Care: Patient's daughter at the bedside. Patient herself is lethargic and nonverbal currently which is not her baseline. Daughter describes her mother as being nonambulatory now for several months but states her appetite has been good up until approximately 2 days ago. She states last night her mother got acutely short of breath and she administered morphine as directed by hospice and palliative care of [facility] with no response. Hospice nurse then came out to the house and attempted other interventions but was subsequently decided to bring her to the hospital due to the severity of her shortness of breath. She is much more comfortable now scant upper airway congestion noted. She is being diuresed and has been given Robinul for upper secretions. While I was speaking with her daughter hospice and palliative care social worker as well  as Hospital liaison was in to visit with family.  Daughter Dorothy Bartlett    SUMMARY OF RECOMMENDATIONS   DO NOT RESUSCITATE DO NOT INTUBATE No hemodialysis Continue in hospice care with hospice and palliative care of [place] Goal is to monitor over the next 24 hours to ascertain level of symptom management needs; at that time family to decide between returning home with some additional equipment versus end-of-life care at [facility] Code Status/Advance Care Planning:  DNR    Symptom Management:   Dyspnea: We'll uptitrate morphine concentrate to 5-10 mg every 4 as needed for shortness of breath or pain  Secretions: We'll add oral Robinul 2 mg every 6 hours as needed for secretions  Palliative Prophylaxis:   Aspiration, Bowel Regimen, Delirium Protocol, Frequent Pain Assessment, Oral Care and Turn Reposition  Additional Recommendations (Limitations, Scope, Preferences):  Avoid Hospitalization and Full Comfort Care  Psycho-social/Spiritual:   Desire for further Chaplaincy support:no  Additional Recommendations: none  Prognosis:   < 6 months. Already under the care  of hospice and palliative care of [facility]  Discharge Planning: Home with additional support and equipment versus inpatient hospice      Primary Diagnoses: Present on Admission: . Acute on chronic combined systolic and diastolic CHF (congestive heart failure) (HCC) . Hypertensive heart  disease . Type 2 diabetes mellitus with renal manifestations (HCC) . 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 Complete heart block (HCC) . CKD stage 5, GFR less than 15 ml/min . Pacemaker-St Jude May 2016 . Paroxysmal atrial fibrillation (HCC) . Cardiorenal syndrome with renal failure   I have reviewed the medical record, interviewed the patient and family, and examined the patient. The following aspects are pertinent.  Past Medical History:  Diagnosis Date  . Anemia due to  pre-end-stage renal disease treated with erythropoietin 05/08/2015  . Carotid stenosis    Carotid US (12/2013): < 60% RCA, < 40% LICA.  [name] CHF (congestive heart failure) (HCC)   . Chronic combined systolic and diastolic heart failure (HCC) 04/13/2014  . Chronic kidney disease (CKD), stage V (HCC)    notes 07/03/2015; Dr. Hyman Hopes  . Complete heart block (HCC)    a. 05/2015 s/p [facility] Assurity DR model [id number] dual chamber PPM (serial number [id number]) (Dr. Johney Frame).  . Coronary artery disease    a. s/p MI 1995 tx w/ PTCA to Dx, Ramus, ap LAD;  b. LHC (11/2004):  Inferior HK, EF 60%, proximal LAD 50%, ostial D1 80%, mid D1 95%, mid LAD 50-60%, distal LAD at the apex 90%, OM1 occluded (fills late by left to left collaterals), RCA 75%. Medical therapy recommended.;  c. Lexiscan Myoview (12/2013):  Prior ant-lat scar with very small area of peri-infarct ischemia in ant wall, EF 40 (high risk)   . Hx of echocardiogram    a. Echo 5/16: dist septal, dist ant, dist inf and apical AK, EF 40-45%, mod MR, mild LAE, PASP 72  . Hyperlipidemia   . Hypertension   . Intrinsic asthma 01/28/2008    Mild intermittant asthma, PFT's 01/28/08     . Obesity (BMI 30-39.9)   . Old anterolateral myocardial infarction   . Presence of permanent cardiac pacemaker   . PVD (peripheral vascular disease) (HCC) 02/16/2014   2015, right femoral peroneal bypass grafting and left femoral-tibial bypass grafting by Dr. Arbie Cookey for rest pain as well as nonhealing ulcer on toe accompanied with toe amputation  Arteriogram October 2015 with patent graft on the right, 70% stenosis of graft on the left proximally.   . Stroke (HCC) 06/29/2016   speech and memory deficits, some R sided weakness  . Type 2 diabetes mellitus with renal manifestations [facility])    Social History   Social History  . Marital status: Widowed    Spouse name: N/A  . Number of children: N/A  . Years of education: N/A   Occupational History  . Retired    Social History  Main Topics  . Smoking status: Never Smoker  . Smokeless tobacco: Never Used  . Alcohol use No  . Drug use: No  . Sexual activity: No   Other Topics Concern  . None   Social History Narrative   Tobacco use cigarettes: Former Smoker   Occupation: Unemployed, retired   Marital Status: Single   Children: 2 daughters   Family History  Problem Relation Age of Onset  . Diabetes Father   . Hypertension Father   . Hypertension Daughter   . Hyperlipidemia Daughter   . Hyperlipidemia Son   . Hypertension Sister    Scheduled Meds: . hydrALAZINE  10 mg Oral TID  . ipratropium-albuterol  3 mL Nebulization TID  . mouth rinse  15 mL Mouth Rinse BID  . metolazone  2.5 mg Oral Q24H  . torsemide  100 mg Oral BID   Continuous Infusions:  PRN Meds:.glycopyrrolate, LORazepam **OR** [DISCONTINUED] LORazepam **OR** LORazepam, morphine injection, morphine CONCENTRATE Medications Prior to Admission:  Prior to Admission medications   Medication Sig Start Date End Date Taking? Authorizing Provider  albuterol (PROVENTIL HFA;VENTOLIN HFA) 108 (90 BASE) MCG/ACT inhaler Inhale 2 puffs into the lungs every 6 (six) hours as needed for wheezing or shortness of breath.   Yes Historical Provider, MD  B Complex-C-Folic Acid (RENA-VITE RX) 1 MG TABS Take 1 tablet by mouth every morning.  02/05/14  Yes Historical Provider, MD  carvedilol (COREG) 12.5 MG tablet Take 1 tablet (12.5 mg total) by mouth 2 (two) times daily. 02/04/14  Yes Lyn RecordsHenry W Smith, MD  docusate sodium (COLACE) 100 MG capsule Take 100 mg by mouth every evening.    Yes Historical Provider, MD  guaiFENesin (ROBITUSSIN) 100 MG/5ML SOLN Take 10 mLs by mouth every 4 (four) hours as needed for cough or to loosen phlegm.   Yes Historical Provider, MD  hydrALAZINE (APRESOLINE) 10 MG tablet Take 1 tablet (10 mg total) by mouth 3 (three) times daily. 09/21/15  Yes Duke SalviaSteven C Klein, MD  isosorbide mononitrate (IMDUR) 30 MG 24 hr tablet Take 1 tablet (30 mg total)  by mouth daily. 07/30/16  Yes Lyn RecordsHenry W Smith, MD  JANUVIA 25 MG tablet Take 25 mg by mouth daily. 06/29/15  Yes Historical Provider, MD  loratadine (CLARITIN) 10 MG tablet Take 10 mg by mouth daily as needed for allergies.   Yes Historical Provider, MD  Melatonin 3 MG TABS Take 3 mg by mouth at bedtime as needed (for sleep).    Yes Historical Provider, MD  metolazone (ZAROXOLYN) 2.5 MG tablet Take 2.5 mg by mouth daily. Take 30 mins before all other am meds   Yes Historical Provider, MD  nitroGLYCERIN (NITROSTAT) 0.4 MG SL tablet Place 1 tablet (0.4 mg total) under the tongue every 5 (five) minutes x 3 doses as needed for chest pain. 06/17/15  Yes Lyn RecordsHenry W Smith, MD  paricalcitol (ZEMPLAR) 1 MCG capsule Take 1 mcg by mouth daily.  04/07/14  Yes Historical Provider, MD  potassium chloride SA (K-DUR,KLOR-CON) 20 MEQ tablet Take 1 tablet (20 mEq total) by mouth once. Patient taking differently: Take 20 mEq by mouth daily.  07/04/16  Yes Catarina Hartshornavid Tat, MD  torsemide (DEMADEX) 20 MG tablet Take 4 tablets (80 mg total) by mouth 2 (two) times daily. Patient taking differently: Take 100 mg by mouth 2 (two) times daily.  08/23/16  Yes Vassie Lollarlos Madera, MD  Vitamin D, Ergocalciferol, (DRISDOL) 50000 UNITS CAPS capsule Take 1 capsule (50,000 Units total) by mouth every 7 (seven) days. 06/15/15  Yes Harold BarbanLawrence Ngo, MD  warfarin (COUMADIN) 6 MG tablet Take as directed by [facility] Patient taking differently: Take 6 mg by mouth daily.  09/04/16  Yes Lyn RecordsHenry W Smith, MD   No Known Allergies Review of Systems  Unable to perform ROS: Patient nonverbal    Physical Exam  Constitutional:  Lethargic, anasarca  HENT:  Head: Normocephalic and atraumatic.  Cardiovascular:  irrg  Pulmonary/Chest:  Mild increased work of breathing at rest  Neurological:  lethargic  Skin: Skin is warm.  Psychiatric:  Unable to test  Nursing note and vitals reviewed.   Vital Signs: BP (!) 128/58 (BP Location: Right Arm)   Pulse 60   Temp  97.8 F (36.6 C) (Oral)   Resp 14   Wt 76.2 kg (167 lb 15.9 oz)   SpO2 99%  BMI 33.93 kg/m  Pain Assessment: PAINAD   Pain Score: 0-No pain   SpO2: SpO2: 99 % O2 Device:SpO2: 99 % O2 Flow Rate: .O2 Flow Rate (L/min): 3 L/min  IO: Intake/output summary:  Intake/Output Summary (Last 24 hours) at 09/28/16 1454 Last data filed at 09/28/16 1413  Gross per 24 hour  Intake              120 ml  Output                0 ml  Net              120 ml    LBM: Last BM Date: 09/27/16 Baseline Weight: Weight: 76.2 kg (167 lb 15.9 oz) Most recent weight: Weight: 76.2 kg (167 lb 15.9 oz)     Palliative Assessment/Data:   Flowsheet Rows   Flowsheet Row Most Recent Value  Intake Tab  Referral Department  Hospitalist  Unit at Time of Referral  Med/Surg Unit  Palliative Care Primary Diagnosis  Cardiac  Date Notified  09/27/16  Reason for referral  Non-pain Symptom, Psychosocial or Spiritual support  Date of Admission  09/26/16  Date first seen by Palliative Care  09/28/16  # of days Palliative referral response time  1 Day(s)  # of days IP prior to Palliative referral  1  Clinical Assessment  Palliative Performance Scale Score  30%  Pain Max last 24 hours  Not able to report  Pain Min Last 24 hours  Not able to report  Dyspnea Max Last 24 Hours  Not able to report  Dyspnea Min Last 24 hours  Not able to report  Nausea Max Last 24 Hours  Not able to report  Nausea Min Last 24 Hours  Not able to report  Anxiety Max Last 24 Hours  Not able to report  Anxiety Min Last 24 Hours  Not able to report  Other Max Last 24 Hours  Not able to report  Psychosocial & Spiritual Assessment  Palliative Care Outcomes      Time In: 1100 Time Out: 1210 Time Total: 70 min Greater than 50%  of this time was spent counseling and coordinating care related to the above assessment and plan.  Signed by: Irean Hong, NP   Please contact Palliative Medicine Team phone at [phone] for questions  and concerns.  For individual provider: See Loretha Stapler

## 2016-09-28 NOTE — Progress Notes (Signed)
Home Care SW made visit to Patient.  Patient's Daughter was present talking to the Palliative Care Nurse Practitioner.  Daughter stated she would like to take Patient home if hospital bed can be put in place before she is discharge from the hospital.  [facility] RN Liaison arrived and will address equipment needs.  SW will continue to follow and offered support during visit.  Maretta LosMadara Shillinglaw, LCSW [facility] [place] [phone]

## 2016-09-28 NOTE — Care Management Note (Addendum)
Case Management Note  Patient Details  Name: Dorothy Bartlett MRN: [id number] Date of Birth: Dec 15, 1926  Action/Plan:  Subjective/Objective:    Pt admitted with worsening SOB , progressive weakness, cough, non productive , worsening lower extremity edema.                Action/Plan:  Pt recently accepted hospice services at home with [facility] [place].  CM contacted agency and spoke with [place].  Liaison confirmed that pt is active and GIP appropriate however agency will handle all care  - no need to go through formal process of discharging and readmitting with hospice.  Liaison is on the way to assess pt now and will follow back up with CM - per MD note pt is now considered comfort care.  CM will continue to follow for discharge needs    Expected Discharge Date:  09/29/16               Expected Discharge Plan:     In-House Referral:     Discharge planning Services     Post Acute Care Choice:    Choice offered to:     DME Arranged:    DME Agency:     HH Arranged:    HH Agency:     Status of Service:     If discussed at [employer], dates discussed:    Additional Comments: Hospice met with pt and daughter today.  Plan is for discharge to either home with hospice or residential hospice tomorrow - dependent on pts progression.  Hopsice SW will arrange residential hospice if determined final discharge place.   Hospice liaison has arranged all needed equipment to be delivered to home prior to discharge including oxygen.  Weekend CM will need to arrange transport home for pt if discharging home.  CM requested CM consult for transport home via physician sticky note.    Maryclare Labrador, RN 09/28/2016, 10:19 AM

## 2016-09-28 NOTE — Progress Notes (Signed)
New Admission Note:   Arrival Method: Stretcher  Mental Orientation: Alert and responsive, Non verbal  Telemetry: N/A Assessment: Completed Skin: Dry and warm  IV: NSL  Pain: No sign and symptom Tubes: N/A Safety Measures: Safety Fall Prevention Plan was given, discussed and signed. Admission: Completed [address] Orientation: Patient has been orientated to the room, unit and the staff. Family: Dtr   Orders have been reviewed and implemented. Will continue to monitor the patient. Call light has been placed within reach and bed alarm has been activated.   [place] ShiEmmanuel Fairley Copher BSN, RN  Phone Number: [phone]

## 2016-09-28 NOTE — Progress Notes (Signed)
Nutrition Brief Note  Chart reviewed. Pt now transitioning to comfort care.  No further nutrition interventions warranted at this time.  Please re-consult as needed.   Starnisha Batrez A. Dezyrae Kensinger, RD, LDN, CDE Pager: [phone] After hours Pager: [phone]  

## 2016-09-29 MED ORDER — MORPHINE SULFATE (CONCENTRATE) 10 MG/0.5ML PO SOLN: 5.0000 mg | ORAL | 0 refills | Status: AC | PRN

## 2016-09-29 MED ORDER — TORSEMIDE 20 MG PO TABS
100.0000 mg | ORAL_TABLET | Freq: Two times a day (BID) | ORAL | Status: AC
Start: 2016-09-29 — End: ?

## 2016-09-29 MED ORDER — IPRATROPIUM-ALBUTEROL 0.5-2.5 (3) MG/3ML IN SOLN: 3.0000 mL | Freq: Four times a day (QID) | RESPIRATORY_TRACT | Status: DC | PRN

## 2016-09-29 NOTE — Progress Notes (Signed)
CM received cal from RN stating [facility]; CM forwarded message to CSW, [place]. No other CM needs were communicated.

## 2016-09-29 NOTE — Discharge Summary (Signed)
Physician Discharge Summary  Dorothy Bartlett ZOX:[id number] DOB: 09-Jul-1926 DOA: 09/27/2016  PCP: Eliott Nine, MD  Admit date: 09/27/2016 Discharge date: 09/29/2016  Time spent: > 35 minutes  Recommendations for Outpatient Follow-up:  1. 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 treated with erythropoietin   Complete heart block (HCC)   Pacemaker-St Jude May 2016   Cardiomyopathy-EF 40-45% by echo May 2016   Palliative care encounter   Paroxysmal atrial fibrillation (HCC)   Acute on chronic combined systolic and diastolic CHF (congestive heart failure) (HCC)   Discharge Condition: guarded  Diet recommendation: comfort feeds  Filed Weights   09/28/16 0048 09/28/16 2112  Weight: 76.2 kg (167 lb 15.9 oz) 76.5 kg (168 lb 10.4 oz)    History of present illness:  82 y/o with history of Cardiorenal syndrome at home hospice who presented to the hospital due to difficulty breathing.  Hospital Course:  Principal Problem:   Cardiorenal syndrome with renal failure Active Problems:   Hypertensive heart disease   CAD S/P PTCA 1995   CKD stage 5, GFR less than 15 ml/min   Type 2 diabetes mellitus with renal manifestations (HCC)   Anemia due to pre-end-stage renal disease treated with erythropoietin   Complete heart block [facility])   Pacemaker-St Jude May 2016   Cardiomyopathy-EF 40-45% by echo May 2016   Palliative care encounter   Paroxysmal atrial fibrillation (HCC)   Acute on chronic combined systolic and diastolic CHF (congestive heart failure) (HCC)  - For problems listed above plan is to continue comfort measures with ativan and  morphine.  Procedures:  None  Consultations:  none  Discharge Exam: Vitals:   09/29/16 0501 09/29/16 0859  BP: (!) 128/59 (!) 113/50  Pulse: 63 (!) 59  Resp: 19 18  Temp: 99.3 F (37.4 C) 97.8 F (36.6 C)    General: Pt in nad, alert and awake Cardiovascular: no cyanosis Respiratory: equal chest rise, no wheezes  Discharge Instructions   Discharge Instructions    Call MD for:  severe uncontrolled pain    Complete by:  As directed    Diet - low sodium heart healthy    Complete by:  As directed    Increase activity slowly    Complete by:  As directed      Current Discharge Medication List    START taking these medications   Details  Morphine Sulfate (MORPHINE CONCENTRATE) 10 MG/0.5ML SOLN concentrated solution Take 0.25-0.5 mLs (5-10 mg total) by mouth every 4 (four) hours as needed for moderate pain, severe pain or shortness of breath. Qty: 180 mL, Refills: 0      CONTINUE these medications which have CHANGED   Details  torsemide (DEMADEX) 20 MG tablet Take 5 tablets (100 mg total) by mouth 2 (two) times daily.      CONTINUE these medications which have NOT CHANGED   Details  albuterol (PROVENTIL HFA;VENTOLIN HFA) 108 (90 BASE) MCG/ACT inhaler Inhale 2 puffs into the lungs every 6 (six) hours as needed for wheezing or shortness of breath.    B Complex-C-Folic Acid (RENA-VITE RX) 1 MG TABS Take 1 tablet by mouth every morning.     docusate sodium (COLACE)  100 MG capsule Take 100 mg by mouth every evening.     hydrALAZINE (APRESOLINE) 10 MG tablet Take 1 tablet (10 mg total) by mouth 3 (three) times daily. Qty: 270 tablet, Refills: 3    Melatonin 3 MG TABS Take 3 mg by mouth at bedtime as needed (for sleep).     metolazone (ZAROXOLYN) 2.5 MG tablet Take 2.5 mg by mouth daily. Take 30 mins before all other am meds    nitroGLYCERIN (NITROSTAT) 0.4 MG SL tablet Place 1 tablet (0.4 mg total) under the tongue every 5 (five) minutes x 3 doses as needed for  chest pain. Qty: 25 tablet, Refills: 3    potassium chloride SA (K-DUR,KLOR-CON) 20 MEQ tablet Take 1 tablet (20 mEq total) by mouth once. Qty: 15 tablet, Refills: 0    Vitamin D, Ergocalciferol, (DRISDOL) 50000 UNITS CAPS capsule Take 1 capsule (50,000 Units total) by mouth every 7 (seven) days. Qty: 10 capsule, Refills: 0      STOP taking these medications     carvedilol (COREG) 12.5 MG tablet      guaiFENesin (ROBITUSSIN) 100 MG/5ML SOLN      isosorbide mononitrate (IMDUR) 30 MG 24 hr tablet      JANUVIA 25 MG tablet      loratadine (CLARITIN) 10 MG tablet      paricalcitol (ZEMPLAR) 1 MCG capsule      warfarin (COUMADIN) 6 MG tablet        No Known Allergies    The results of significant diagnostics from this hospitalization (including imaging, microbiology, ancillary and laboratory) are listed below for reference.    Significant Diagnostic Studies: Dg Chest 2 View  Result Date: 09/27/2016 CLINICAL DATA:  Acute onset of shortness of breath. Initial encounter. EXAM: CHEST  2 VIEW COMPARISON:  Chest radiograph performed 08/17/2016 FINDINGS: Small to moderate right and small left pleural effusions are seen. Bibasilar airspace opacities raise concern for mild pulmonary edema. No pneumothorax is seen. The cardiomediastinal silhouette is enlarged. A pacemaker is noted at the left chest wall, with leads ending at the right atrium and right ventricle. No acute osseous abnormalities are seen. IMPRESSION: Small to moderate right and small left pleural effusions noted. Bibasilar airspace opacities raise concern for mild pulmonary edema. Cardiomegaly noted. Electronically Signed   By: Roanna RaiderJeffery  Chang M.D.   On: 09/27/2016 23:15    Microbiology: No results found for this or any previous visit (from the past 240 hour(s)).   Labs: Basic Metabolic Panel: No results for input(s): NA, K, CL, CO2, GLUCOSE, BUN, CREATININE, CALCIUM, MG, PHOS in the last 168 hours. Liver Function  Tests: No results for input(s): AST, ALT, ALKPHOS, BILITOT, PROT, ALBUMIN in the last 168 hours. No results for input(s): LIPASE, AMYLASE in the last 168 hours. No results for input(s): AMMONIA in the last 168 hours. CBC: No results for input(s): WBC, NEUTROABS, HGB, HCT, MCV, PLT in the last 168 hours. Cardiac Enzymes: No results for input(s): CKTOTAL, CKMB, CKMBINDEX, TROPONINI in the last 168 hours. BNP: BNP (last 3 results)  Recent Labs  06/29/16 2237 08/17/16 1548  BNP 4,002.8* 4,317.4*    ProBNP (last 3 results) No results for input(s): PROBNP in the last 8760 hours.  CBG: No results for input(s): GLUCAP in the last 168 hours.  Signed:  [name], Usama Harkless MD.  [facility] 09/29/2016, 3:45 PM

## 2016-09-29 NOTE — Progress Notes (Signed)
[facility] Anticipated DC date: 09/29/16 Family notified: LM for Daughter (aware of transfer) Transport by: Sharin MonsPTAR   Per MD [facility]. RN, patient, patient's family, and facility notified of DC. Discharge Summary faxed to facility. RN given number for report. DC packet on chart. Ambulance transport requested for patient.   CSW signing off.  Cristobal GoldmannNadia Jajaira Ruis, [place] Clinical Social Worker [phone]

## 2016-09-29 NOTE — Progress Notes (Signed)
[facility]. Patient to go to room 2. PTAR notified by SW for transport. Bess KindsGWALTNEY, Tamerra Merkley B, RN

## 2016-09-29 NOTE — Progress Notes (Signed)
[facility]- 6E-10  [facility] RN Note   This is a GIP, covered admission from 09/28/16 related to [facility] diagnosis of CHF, per Dr. Barbee ShropshireHertweck.  Patient is a DNR.  Patient was brought to ED, due to respiratory distress, after she was treated in the home with Roxanol with minimal improvement. She was admitted for treatment of cardiorenal syndrome with renal failure in end-stage CHF.  Visited patient at bedside.  Daughter Harriett Sineancy present.  Had lengthy discussion with daughter and Eduard RouxSarah Bullard NP regarding discharge disposition.  Daughter has many questions about caring for the patient at home with a foley catheter and also concerns about medications and whether or not her respiratory symptoms would reoccur after discharge.  Daughter is requesting patient transfer [facility] for at least a few days to be monitored before discharging back home.   Patient is alert and responsive, although she is quite sleepy.  She is eating approximately 25% of meals and is able to swallow po meds with applesauce. She denies pain. She is on 02 and is noted to be using abdominal accessory muscles to breathe.   She now has a foley catheter placed yesterday - urine is golden color.  Patient has been diuresed with some improvement in her respiratory status.   Discussed case with Dr. Barbee ShropshireHertweck; patient is [facility] and there is a bed available today.   Daughter would prefer she stay in the hospital one more day, however I did encourage her to take the bed while it is available and she was in agreement.    Necessary paperwork has already been completed by [facility] SW yesterday.   Patient can transfer today.    Please fax discharge summary to [facility] at [phone].  RN please call report to [phone].    Please arrange for transport as soon as possible.   Thank you!  Please call with any questions.   Lavone NeriSusan Thomas, RN  [facility] Liaison  [phone]

## 2016-09-29 NOTE — Progress Notes (Signed)
[facility] at bedside to [facility]. Patient's daughter, Harriett Sineancy, notified. Bess KindsGWALTNEY, Sebastian Lurz B, RN

## 2016-09-29 NOTE — Progress Notes (Signed)
CSW received consult regarding [facility] discharge [facility]. [facility] alerted.  Osborne Cascoadia Alamin Mccuiston LCSWA [phone]

## 2016-10-08 ENCOUNTER — Ambulatory Visit: Payer: Self-pay | Admitting: *Deleted

## 2016-10-08 ENCOUNTER — Telehealth: Payer: Self-pay | Admitting: Interventional Cardiology

## 2016-10-08 ENCOUNTER — Telehealth: Payer: Self-pay

## 2016-10-08 DIAGNOSIS — I639 Cerebral infarction, unspecified: Secondary | ICD-10-CM

## 2016-10-08 DIAGNOSIS — I480 Paroxysmal atrial fibrillation: Secondary | ICD-10-CM

## 2016-10-08 NOTE — Telephone Encounter (Signed)
Rec'd phone call from pt's daughter; requested to inform Dr. Arbie CookeyEarly that pt. passed away 10/21/2016, due to kidney failure.  Reported the pt. was receiving Hospice care in her last days.  The pt's daughter requested to thank Dr. Arbie CookeyEarly for the wonderful care of her mother. Advised will make Dr. Arbie CookeyEarly aware.

## 2016-10-08 NOTE — Telephone Encounter (Signed)
Dorothy Bartlett ( daughter) wants to give an update on her mother's health status and is asking that you give her a call back . Thanks

## 2016-10-08 NOTE — Telephone Encounter (Signed)
Left message to call back  

## 2016-10-09 NOTE — Telephone Encounter (Signed)
Called Dorothy Bartlett and let her know that pts home monitor could be discarded, she did not need to send it back, she voiced understanding.

## 2016-10-09 NOTE — Telephone Encounter (Signed)
Daughter calling to inform Dr. Katrinka BlazingSmith, Dr. Graciela HusbandsKlein and [facility] that pt passed away on 10/07/2016.  Daughter wanted to make sure all appts are cancelled.  Advised her I would cancel any appts at our office.  Daughter also unsure of what to do with pt's machine she used for pacer checks.  Advised I would send message to our device team and have them follow up as I was unsure.  Daughter also states that pt wanted Dr. Katrinka BlazingSmith, Dr. Graciela HusbandsKlein and staff to know that she was very appreciative for all of her care and good treatment at our office.  Advised I would route message to everyone to make them aware.  Daughter very appreciative for help.

## 2016-10-09 NOTE — Telephone Encounter (Signed)
Spoke to daughter.

## 2016-10-09 NOTE — Telephone Encounter (Signed)
Thanks for the update & the pt's Anticoagulation track has been made inactive. Hemphill, Sherrilee Gillesandance Bianca, RN

## 2016-10-16 LAB — CUP PACEART REMOTE DEVICE CHECK
Brady Statistic AP VP Percent: 37 %
Brady Statistic AP VS Percent: 1 %
Brady Statistic AS VP Percent: 62 %
Brady Statistic RV Percent Paced: 99 %
Date Time Interrogation Session: 20170919060016
Implantable Lead Implant Date: 20160510
Lead Channel Impedance Value: 400 Ohm
Lead Channel Impedance Value: 460 Ohm
Lead Channel Pacing Threshold Amplitude: 0.5 V
Lead Channel Sensing Intrinsic Amplitude: 12 mV
Lead Channel Setting Pacing Amplitude: 1.625
Lead Channel Setting Pacing Pulse Width: 0.5 ms
Lead Channel Setting Sensing Sensitivity: 5 mV
MDC IDC LEAD IMPLANT DT: 20160510
MDC IDC LEAD LOCATION: 753859
MDC IDC LEAD LOCATION: 753860
MDC IDC MSMT BATTERY REMAINING LONGEVITY: 125 mo
MDC IDC MSMT BATTERY REMAINING PERCENTAGE: 95.5 %
MDC IDC MSMT BATTERY VOLTAGE: 3.01 V
MDC IDC MSMT LEADCHNL RA PACING THRESHOLD AMPLITUDE: 0.625 V
MDC IDC MSMT LEADCHNL RA PACING THRESHOLD PULSEWIDTH: 0.4 ms
MDC IDC MSMT LEADCHNL RA SENSING INTR AMPL: 1.2 mV
MDC IDC MSMT LEADCHNL RV PACING THRESHOLD PULSEWIDTH: 0.5 ms
MDC IDC SET LEADCHNL RV PACING AMPLITUDE: 0.75 V
MDC IDC STAT BRADY AS VS PERCENT: 1 %
MDC IDC STAT BRADY RA PERCENT PACED: 37 %
Pulse Gen Model: 2240
Pulse Gen Serial Number: 7759868

## 2016-10-31 DEATH — deceased

## 2016-11-19 ENCOUNTER — Ambulatory Visit: Payer: Medicare Other | Admitting: Interventional Cardiology

## 2016-12-13 ENCOUNTER — Ambulatory Visit: Payer: Medicare Other | Admitting: Podiatry
